# Patient Record
Sex: Female | Born: 1935
Health system: Southern US, Community
[De-identification: ages and names within clinical notes are randomized; demographics above are authoritative.]

## PROBLEM LIST (undated history)

## (undated) DIAGNOSIS — I10 Essential (primary) hypertension: Secondary | ICD-10-CM

## (undated) DIAGNOSIS — J310 Chronic rhinitis: Secondary | ICD-10-CM

## (undated) DIAGNOSIS — J439 Emphysema, unspecified: Secondary | ICD-10-CM

## (undated) DIAGNOSIS — R739 Hyperglycemia, unspecified: Secondary | ICD-10-CM

## (undated) DIAGNOSIS — Z780 Asymptomatic menopausal state: Secondary | ICD-10-CM

## (undated) DIAGNOSIS — M899 Disorder of bone, unspecified: Secondary | ICD-10-CM

## (undated) DIAGNOSIS — N952 Postmenopausal atrophic vaginitis: Secondary | ICD-10-CM

## (undated) DIAGNOSIS — F3289 Other specified depressive episodes: Secondary | ICD-10-CM

## (undated) DIAGNOSIS — E785 Hyperlipidemia, unspecified: Secondary | ICD-10-CM

## (undated) DIAGNOSIS — H269 Unspecified cataract: Secondary | ICD-10-CM

## (undated) DIAGNOSIS — M1990 Unspecified osteoarthritis, unspecified site: Secondary | ICD-10-CM

## (undated) DIAGNOSIS — E119 Type 2 diabetes mellitus without complications: Secondary | ICD-10-CM

## (undated) DIAGNOSIS — M949 Disorder of cartilage, unspecified: Secondary | ICD-10-CM

## (undated) DIAGNOSIS — F329 Major depressive disorder, single episode, unspecified: Secondary | ICD-10-CM

## (undated) DIAGNOSIS — J449 Chronic obstructive pulmonary disease, unspecified: Secondary | ICD-10-CM

## (undated) DIAGNOSIS — F419 Anxiety disorder, unspecified: Secondary | ICD-10-CM

## (undated) HISTORY — DX: Postmenopausal atrophic vaginitis: N95.2

## (undated) HISTORY — DX: Hyperglycemia, unspecified: R73.9

## (undated) HISTORY — DX: Type 2 diabetes mellitus without complications: E11.9

## (undated) HISTORY — DX: Disorder of cartilage, unspecified: M94.9

## (undated) HISTORY — DX: Chronic rhinitis: J31.0

## (undated) HISTORY — DX: Unspecified cataract: H26.9

## (undated) HISTORY — DX: Anxiety disorder, unspecified: F41.9

## (undated) HISTORY — DX: Unspecified osteoarthritis, unspecified site: M19.90

## (undated) HISTORY — DX: Asymptomatic menopausal state: Z78.0

## (undated) HISTORY — DX: Disorder of bone, unspecified: M89.9

## (undated) HISTORY — DX: Hyperlipidemia, unspecified: E78.5

## (undated) HISTORY — DX: Major depressive disorder, single episode, unspecified: F32.9

## (undated) HISTORY — PX: JOINT REPLACEMENT: SHX530

## (undated) HISTORY — DX: Other specified depressive episodes: F32.89

## (undated) HISTORY — PX: EYE SURGERY: SHX253

---
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
## 2000-08-04 ENCOUNTER — Other Ambulatory Visit: Admission: RE | Admit: 2000-08-04 | Discharge: 2000-08-04 | Payer: Self-pay | Admitting: Family Medicine

## 2002-04-11 ENCOUNTER — Other Ambulatory Visit: Admission: RE | Admit: 2002-04-11 | Discharge: 2002-04-11 | Payer: Self-pay | Admitting: Family Medicine

## 2003-04-14 HISTORY — PX: CATARACT EXTRACTION W/ INTRAOCULAR LENS IMPLANT: SHX1309

## 2003-04-25 ENCOUNTER — Other Ambulatory Visit: Admission: RE | Admit: 2003-04-25 | Discharge: 2003-04-25 | Payer: Self-pay | Admitting: Family Medicine

## 2005-12-09 ENCOUNTER — Other Ambulatory Visit: Admission: RE | Admit: 2005-12-09 | Discharge: 2005-12-09 | Payer: Self-pay | Admitting: Family Medicine

## 2006-07-28 ENCOUNTER — Ambulatory Visit: Payer: Self-pay | Admitting: Cardiology

## 2006-08-05 ENCOUNTER — Ambulatory Visit: Payer: Self-pay

## 2007-04-08 ENCOUNTER — Emergency Department (HOSPITAL_COMMUNITY): Admission: EM | Admit: 2007-04-08 | Discharge: 2007-04-08 | Payer: Self-pay | Admitting: Emergency Medicine

## 2007-09-06 ENCOUNTER — Ambulatory Visit (HOSPITAL_COMMUNITY): Admission: RE | Admit: 2007-09-06 | Discharge: 2007-09-06 | Payer: Self-pay | Admitting: Family Medicine

## 2007-12-05 ENCOUNTER — Ambulatory Visit (HOSPITAL_COMMUNITY): Admission: RE | Admit: 2007-12-05 | Discharge: 2007-12-05 | Payer: Self-pay | Admitting: Family Medicine

## 2008-12-10 LAB — HM PAP SMEAR

## 2009-07-03 LAB — FECAL OCCULT BLOOD, GUAIAC: Fecal Occult Blood: NEGATIVE

## 2010-02-10 LAB — HM MAMMOGRAPHY

## 2010-04-12 ENCOUNTER — Inpatient Hospital Stay (HOSPITAL_COMMUNITY): Admission: EM | Admit: 2010-04-12 | Discharge: 2010-04-15 | Payer: Self-pay | Source: Home / Self Care

## 2010-06-23 LAB — CBC
HCT: 36.5 % (ref 36.0–46.0)
HCT: 39.8 % (ref 36.0–46.0)
Hemoglobin: 14.6 g/dL (ref 12.0–15.0)
MCH: 30.7 pg (ref 26.0–34.0)
MCH: 31.2 pg (ref 26.0–34.0)
MCHC: 36.7 g/dL — ABNORMAL HIGH (ref 30.0–36.0)
MCV: 85 fL (ref 78.0–100.0)
MCV: 89 fL (ref 78.0–100.0)
Platelets: 238 K/uL (ref 150–400)
Platelets: 239 10*3/uL (ref 150–400)
Platelets: 253 10*3/uL (ref 150–400)
RBC: 4.1 MIL/uL (ref 3.87–5.11)
RBC: 4.46 MIL/uL (ref 3.87–5.11)
RBC: 4.68 MIL/uL (ref 3.87–5.11)
RDW: 12.4 % (ref 11.5–15.5)
RDW: 13 % (ref 11.5–15.5)
WBC: 7.1 K/uL (ref 4.0–10.5)

## 2010-06-23 LAB — CK TOTAL AND CKMB (NOT AT ARMC)
CK, MB: 10 ng/mL (ref 0.3–4.0)
Relative Index: 1.7 (ref 0.0–2.5)
Total CK: 585 U/L — ABNORMAL HIGH (ref 7–177)

## 2010-06-23 LAB — DIFFERENTIAL
Basophils Absolute: 0 10*3/uL (ref 0.0–0.1)
Basophils Absolute: 0 K/uL (ref 0.0–0.1)
Basophils Absolute: 0 K/uL (ref 0.0–0.1)
Basophils Relative: 0 % (ref 0–1)
Basophils Relative: 0 % (ref 0–1)
Eosinophils Absolute: 0 K/uL (ref 0.0–0.7)
Eosinophils Absolute: 0 K/uL (ref 0.0–0.7)
Eosinophils Absolute: 0.1 10*3/uL (ref 0.0–0.7)
Eosinophils Relative: 0 % (ref 0–5)
Eosinophils Relative: 0 % (ref 0–5)
Eosinophils Relative: 2 % (ref 0–5)
Lymphocytes Relative: 15 % (ref 12–46)
Lymphocytes Relative: 17 % (ref 12–46)
Lymphs Abs: 1.1 K/uL (ref 0.7–4.0)
Lymphs Abs: 1.4 K/uL (ref 0.7–4.0)
Lymphs Abs: 1.8 10*3/uL (ref 0.7–4.0)
Monocytes Absolute: 0.3 K/uL (ref 0.1–1.0)
Monocytes Absolute: 0.6 K/uL (ref 0.1–1.0)
Monocytes Relative: 5 % (ref 3–12)
Monocytes Relative: 8 % (ref 3–12)
Monocytes Relative: 9 % (ref 3–12)
Neutro Abs: 5.6 K/uL (ref 1.7–7.7)
Neutro Abs: 5.9 K/uL (ref 1.7–7.7)
Neutrophils Relative %: 74 % (ref 43–77)
Neutrophils Relative %: 80 % — ABNORMAL HIGH (ref 43–77)

## 2010-06-23 LAB — LIPID PANEL
HDL: 48 mg/dL
Total CHOL/HDL Ratio: 3 ratio
Triglycerides: 24 mg/dL
VLDL: 5 mg/dL (ref 0–40)

## 2010-06-23 LAB — URINALYSIS, ROUTINE W REFLEX MICROSCOPIC
Bilirubin Urine: NEGATIVE
Glucose, UA: NEGATIVE mg/dL
Hgb urine dipstick: NEGATIVE
Ketones, ur: NEGATIVE mg/dL
Nitrite: NEGATIVE
Protein, ur: NEGATIVE mg/dL
Specific Gravity, Urine: <1.005 — ABNORMAL LOW (ref 1.005–1.030)
Urobilinogen, UA: 0.2 mg/dL (ref 0.0–1.0)
pH: 6 (ref 5.0–8.0)

## 2010-06-23 LAB — COMPREHENSIVE METABOLIC PANEL WITH GFR
ALT: 18 U/L (ref 0–35)
ALT: 19 U/L (ref 0–35)
AST: 30 U/L (ref 0–37)
AST: 32 U/L (ref 0–37)
Albumin: 3.5 g/dL (ref 3.5–5.2)
Albumin: 4.3 g/dL (ref 3.5–5.2)
Alkaline Phosphatase: 52 U/L (ref 39–117)
Alkaline Phosphatase: 75 U/L (ref 39–117)
BUN: 7 mg/dL (ref 6–23)
BUN: 9 mg/dL (ref 6–23)
CO2: 23 meq/L (ref 19–32)
CO2: 25 meq/L (ref 19–32)
Calcium: 8.6 mg/dL (ref 8.4–10.5)
Calcium: 9.1 mg/dL (ref 8.4–10.5)
Chloride: 104 meq/L (ref 96–112)
Chloride: 92 meq/L — ABNORMAL LOW (ref 96–112)
Creatinine, Ser: 0.93 mg/dL (ref 0.4–1.2)
Creatinine, Ser: 0.96 mg/dL (ref 0.4–1.2)
GFR calc non Af Amer: 57 mL/min — ABNORMAL LOW
GFR calc non Af Amer: 59 mL/min — ABNORMAL LOW
Glucose, Bld: 100 mg/dL — ABNORMAL HIGH (ref 70–99)
Glucose, Bld: 149 mg/dL — ABNORMAL HIGH (ref 70–99)
Potassium: 4.3 meq/L (ref 3.5–5.1)
Potassium: 4.9 meq/L (ref 3.5–5.1)
Sodium: 122 meq/L — ABNORMAL LOW (ref 135–145)
Sodium: 135 meq/L (ref 135–145)
Total Bilirubin: 0.7 mg/dL (ref 0.3–1.2)
Total Bilirubin: 0.8 mg/dL (ref 0.3–1.2)
Total Protein: 5.8 g/dL — ABNORMAL LOW (ref 6.0–8.3)
Total Protein: 7.4 g/dL (ref 6.0–8.3)

## 2010-06-23 LAB — LIPASE, BLOOD: Lipase: 18 U/L (ref 11–59)

## 2010-06-23 LAB — URINE CULTURE
Colony Count: NO GROWTH
Culture  Setup Time: 201201012123
Culture: NO GROWTH

## 2010-06-23 LAB — LEGIONELLA ANTIGEN, URINE: Legionella Antigen, Urine: NEGATIVE

## 2010-06-23 LAB — CK: Total CK: 378 U/L — ABNORMAL HIGH (ref 7–177)

## 2010-06-23 LAB — CARDIAC PANEL(CRET KIN+CKTOT+MB+TROPI)
CK, MB: 11.7 ng/mL (ref 0.3–4.0)
Relative Index: 2.1 (ref 0.0–2.5)
Relative Index: 2.5 (ref 0.0–2.5)
Relative Index: 3.1 — ABNORMAL HIGH (ref 0.0–2.5)
Total CK: 369 U/L — ABNORMAL HIGH (ref 7–177)
Total CK: 463 U/L — ABNORMAL HIGH (ref 7–177)
Total CK: 489 U/L — ABNORMAL HIGH (ref 7–177)

## 2010-06-23 LAB — BASIC METABOLIC PANEL
BUN: 11 mg/dL (ref 6–23)
CO2: 23 mEq/L (ref 19–32)
CO2: 25 mEq/L (ref 19–32)
Calcium: 8.8 mg/dL (ref 8.4–10.5)
Chloride: 100 mEq/L (ref 96–112)
Creatinine, Ser: 0.92 mg/dL (ref 0.4–1.2)
GFR calc Af Amer: >60 mL/min (ref >60)
GFR calc non Af Amer: 59 mL/min — ABNORMAL LOW (ref >60)
GFR calc non Af Amer: >60 mL/min (ref >60)
Glucose, Bld: 126 mg/dL — ABNORMAL HIGH (ref 70–99)
Potassium: 4.3 mEq/L (ref 3.5–5.1)
Potassium: 4.6 mEq/L (ref 3.5–5.1)
Sodium: 130 mEq/L — ABNORMAL LOW (ref 135–145)
Sodium: 138 mEq/L (ref 135–145)

## 2010-06-23 LAB — BRAIN NATRIURETIC PEPTIDE: Pro B Natriuretic peptide (BNP): 58.8 pg/mL (ref 0.0–100.0)

## 2010-06-23 LAB — TSH: TSH: 0.628 u[IU]/mL (ref 0.350–4.500)

## 2010-07-04 ENCOUNTER — Encounter: Payer: Self-pay | Admitting: Family Medicine

## 2010-07-04 DIAGNOSIS — R739 Hyperglycemia, unspecified: Secondary | ICD-10-CM

## 2010-07-04 DIAGNOSIS — E785 Hyperlipidemia, unspecified: Secondary | ICD-10-CM

## 2010-07-04 DIAGNOSIS — F3289 Other specified depressive episodes: Secondary | ICD-10-CM

## 2010-07-04 DIAGNOSIS — N952 Postmenopausal atrophic vaginitis: Secondary | ICD-10-CM

## 2010-07-04 DIAGNOSIS — F419 Anxiety disorder, unspecified: Secondary | ICD-10-CM | POA: Insufficient documentation

## 2010-07-04 DIAGNOSIS — F418 Other specified anxiety disorders: Secondary | ICD-10-CM | POA: Insufficient documentation

## 2010-07-04 DIAGNOSIS — M899 Disorder of bone, unspecified: Secondary | ICD-10-CM

## 2010-07-04 DIAGNOSIS — F329 Major depressive disorder, single episode, unspecified: Secondary | ICD-10-CM

## 2010-07-04 DIAGNOSIS — J310 Chronic rhinitis: Secondary | ICD-10-CM | POA: Insufficient documentation

## 2010-07-04 DIAGNOSIS — Z780 Asymptomatic menopausal state: Secondary | ICD-10-CM

## 2010-08-29 NOTE — Assessment & Plan Note (Signed)
[facility]                            CARDIOLOGY OFFICE NOTE   NAME:[name], [name] DARROW                      MRN:          [id number]  DATE:07/28/2006                            DOB:          Apr 12, 1932    PRIMARY CARE PHYSICIAN:  [name] [name], M.D.   REASON FOR PRESENTATION:  Evaluate patient with abnormal EKG.   HISTORY OF PRESENT ILLNESS:  The patient is 77 years old.  She has had  an abnormal EKG with poor anterior R wave progression.  She has had this  previously.  There was a question on her echocardiogram of some  questionable hypokinesis of the septum.  She has had some problems  recently.  There is stress at work.  She had one episode where she  became very hypertensive by her report (180/84).  She felt lightheaded.  She may have had some dyspnea.  She does occasionally get some pain  under her left breast.  This seems to be a stable pattern.  It is not  associated with activity.  It is sharp.  There is no associated nausea,  vomiting, or diaphoresis.  It goes away spontaneously.  She has not had  any new shortness of breath other than above.  She has had no PND or  orthopnea.  She is not describing palpitations, presyncope, or syncope.   PAST MEDICAL HISTORY:  1. Hypertension.  2. Anxiety.  3. Hyperlipidemia.   PAST SURGICAL HISTORY:  Cataract surgery.   ALLERGIES:  PENICILLIN, SHELLFISH, BETADINE, LIBRAX, SEPTRA, MYCINS.   MEDICATIONS:  1. Aspirin 81 mg daily.  2. Fish oil.  3. Lexapro 10 mg daily.  4. Calcium.  5. Lovastatin 20 mg daily.  6. Xanax.   SOCIAL HISTORY:  The patient works at [employer].  She is married.  She has  2 children.  She does not smoke cigarettes or drink alcohol.   FAMILY HISTORY:  Noncontributory for early coronary disease, though she  did have a sister who died of an aneurysm.   REVIEW OF SYSTEMS:  As stated in the HPI.  Positive for asthma, lower  extremity swelling.  Negative for other systems.   PHYSICAL EXAMINATION:  GENERAL:  The patient is anxious appearing but in  no distress.  VITAL SIGNS:  Blood pressure 120/68, heart rate 61 and regular, weight  153 pounds, body mass index 28.  HEENT:  Eyelids unremarkable.  Pupils equal, round, and reactive to  light.  Fundi not visualized. Oral mucosa unremarkable.  NECK:  No jugular venous distention.  Waveform within normal limits.  Carotid upstroke brisk and symmetric.  No bruits.  No thyromegaly.  LYMPHATICS:  No cervical, axillary, or inguinal adenopathy.  LUNGS:  Clear to auscultation bilaterally.  BACK:  No costovertebral angle tenderness.  CHEST:  Unremarkable.  HEART:  PMI not displaced or sustained.  S1 and S2 within normal limits.  No S3, no S4, no clicks, rubs, murmurs.  ABDOMEN:  Flat, positive bowel sounds, normal in frequency and pitch.  No bruits, rebound, guarding, or midline pulsatile mass.  No  hepatomegaly or splenomegaly.  SKIN:  No rashes, no nodules.  EXTREMITIES:  2+ pulses throughout.  No edema, cyanosis, or clubbing.  NEUROLOGIC:  Oriented to person, place, and time.  Cranial nerves II-XII  grossly intact.  Motor grossly intact.   EKG:  Sinus rhythm, left axis deviation, poor anterior R wave  progression.  Cannot exclude anteroseptal myocardial infarction.   ASSESSMENT AND PLAN:  1. Abnormal electrocardiogram.  The patient has an abnormal      electrocardiogram.  She had a slightly abnormal echocardiogram 3-      1/2 years ago.  She does have some chest discomfort and risk      factors.  The pretest probability of obstructive coronary disease      is moderate.  She says she would not be able to walk on a treadmill      because of joint limitations.  Therefore, she needs stress test      with an adenosine Cardiolite.  Further evaluation will be based on      these results.  2. Hypertension.  She seems to be doing well with lifestyle      modification and will continue this.  3. Followup will be based on  the above.     Rollene Rotunda, MD, [facility]  Electronically Signed    JH/MedQ  DD: 07/28/2006  DT: 07/28/2006  Job #: [id number]   cc:   [name] [name], M.D.

## 2011-05-22 ENCOUNTER — Other Ambulatory Visit: Payer: Self-pay | Admitting: Orthopedic Surgery

## 2011-05-22 DIAGNOSIS — M25562 Pain in left knee: Secondary | ICD-10-CM

## 2011-05-23 ENCOUNTER — Other Ambulatory Visit: Payer: Self-pay

## 2011-05-27 ENCOUNTER — Ambulatory Visit
Admission: RE | Admit: 2011-05-27 | Discharge: 2011-05-27 | Disposition: A | Discharge status: Home / Self Care | Payer: Medicare Other | Source: Ambulatory Visit | Attending: Orthopedic Surgery | Admitting: Orthopedic Surgery

## 2011-05-27 DIAGNOSIS — M25562 Pain in left knee: Secondary | ICD-10-CM

## 2012-08-09 ENCOUNTER — Other Ambulatory Visit (INDEPENDENT_AMBULATORY_CARE_PROVIDER_SITE_OTHER): Payer: Medicare Other

## 2012-08-09 DIAGNOSIS — Z79899 Other long term (current) drug therapy: Secondary | ICD-10-CM

## 2012-08-09 DIAGNOSIS — E559 Vitamin D deficiency, unspecified: Secondary | ICD-10-CM

## 2012-08-09 DIAGNOSIS — R5383 Other fatigue: Secondary | ICD-10-CM

## 2012-08-09 DIAGNOSIS — R5381 Other malaise: Secondary | ICD-10-CM

## 2012-08-09 DIAGNOSIS — E785 Hyperlipidemia, unspecified: Secondary | ICD-10-CM

## 2012-08-09 LAB — POCT CBC
HCT, POC: 43 % (ref 37.7–47.9)
Lymph, poc: 2.3 (ref 0.6–3.4)
MCHC: 33.7 g/dL (ref 31.8–35.4)
MCV: 92 fL (ref 80–97)
POC Granulocyte: 4 (ref 2–6.9)
Platelet Count, POC: 233 10*3/uL (ref 142–424)
RDW, POC: 13.4 %
WBC: 6.5 10*3/uL (ref 4.6–10.2)

## 2012-08-09 LAB — BASIC METABOLIC PANEL
BUN: 13 mg/dL (ref 6–23)
CO2: 27 mEq/L (ref 19–32)
Chloride: 103 mEq/L (ref 96–112)
Creat: 0.77 mg/dL (ref 0.50–1.10)
Glucose, Bld: 125 mg/dL — ABNORMAL HIGH (ref 70–99)

## 2012-08-09 LAB — HEPATIC FUNCTION PANEL
ALT: 15 U/L (ref 0–35)
Albumin: 4.4 g/dL (ref 3.5–5.2)
Indirect Bilirubin: 0.7 mg/dL (ref 0.0–0.9)
Total Protein: 6.9 g/dL (ref 6.0–8.3)

## 2012-08-10 LAB — NMR LIPOPROFILE WITH LIPIDS
Cholesterol, Total: 127 mg/dL (ref <200)
HDL Particle Number: 28.7 umol/L — ABNORMAL LOW (ref >=30.5)
Large VLDL-P: <0.8 nmol/L (ref <=2.7)
Small LDL Particle Number: 418 nmol/L (ref <=527)

## 2012-08-10 LAB — VITAMIN D 25 HYDROXY (VIT D DEFICIENCY, FRACTURES): Vit D, 25-Hydroxy: 49 ng/mL (ref 30–89)

## 2012-08-16 NOTE — Addendum Note (Signed)
Addended by: Orma Render F on: 08/16/2012 03:36 PM   Modules accepted: Orders

## 2012-08-18 ENCOUNTER — Ambulatory Visit (INDEPENDENT_AMBULATORY_CARE_PROVIDER_SITE_OTHER): Payer: Medicare Other | Admitting: Family Medicine

## 2012-08-18 ENCOUNTER — Encounter: Payer: Self-pay | Admitting: Family Medicine

## 2012-08-18 ENCOUNTER — Ambulatory Visit (INDEPENDENT_AMBULATORY_CARE_PROVIDER_SITE_OTHER): Payer: Medicare Other

## 2012-08-18 VITALS — BP 117/60 | HR 67 | Temp 97.7°F | Ht 61.5 in | Wt 152.0 lb

## 2012-08-18 DIAGNOSIS — M545 Low back pain, unspecified: Secondary | ICD-10-CM

## 2012-08-18 DIAGNOSIS — M79605 Pain in left leg: Secondary | ICD-10-CM

## 2012-08-18 DIAGNOSIS — E785 Hyperlipidemia, unspecified: Secondary | ICD-10-CM

## 2012-08-18 DIAGNOSIS — F419 Anxiety disorder, unspecified: Secondary | ICD-10-CM

## 2012-08-18 DIAGNOSIS — R739 Hyperglycemia, unspecified: Secondary | ICD-10-CM

## 2012-08-18 DIAGNOSIS — J310 Chronic rhinitis: Secondary | ICD-10-CM

## 2012-08-18 DIAGNOSIS — R7309 Other abnormal glucose: Secondary | ICD-10-CM

## 2012-08-18 DIAGNOSIS — F411 Generalized anxiety disorder: Secondary | ICD-10-CM

## 2012-08-18 NOTE — Progress Notes (Signed)
  Subjective:    Patient ID: [name] [name], female    DOB: 1932/01/24, 79 y.o.   MRN: [id number]  HPI Patient saw Dr. Lajoyce Corners about 2 weeks ago for left leg pain. He had seen him previously for left knee pain. She continues to hurt down her left leg especially with walking at work. Occasional stiffness if she stands for long periods   Review of Systems  Constitutional: Negative.   HENT: Positive for congestion.   Eyes: Positive for itching (due to allergies).  Respiratory: Positive for cough.   Cardiovascular: Positive for leg swelling (slight).  Gastrointestinal: Negative.   Genitourinary: Negative.   Musculoskeletal: Positive for back pain (LBP) and arthralgias (L hip radiating down leg).  Allergic/Immunologic: Positive for environmental allergies (seasonal).  Psychiatric/Behavioral: Negative.        Objective:   Physical Exam BP 117/60  Pulse 67  Temp(Src) 97.7 F (36.5 C) (Oral)  Ht 5' 1.5" (1.562 m)  Wt 152 lb (68.947 kg)  BMI 28.26 kg/m2  The patient appeared well nourished and normally developed, alert and oriented to time and place. Speech, behavior and judgement appear normal. Vital signs as documented.  Head exam is unremarkable. No scleral icterus or pallor noted. Nasal congestion bilaterally. TMs were normal. Throat and mouth were normal. Neck is without jugular venous distension, thyromegally, or carotid bruits. Carotid upstrokes are brisk bilaterally. No cervical adenopathy. Lungs are clear anteriorly and posteriorly to auscultation. Normal respiratory effort. Cardiac exam reveals regular rate and rhythm at 72 per minute. First and second heart sounds normal.  No murmurs, rubs or gallops.  Abdominal exam reveals normal bowl sounds, no masses, no organomegaly and no aortic enlargement. No inguinal adenopathy. Some protuberant obesity. Extremities are nonedematous and both femoral and pedal pulses are normal. With leg raising on the left there was left back and hip  pain. Skin without pallor or jaundice.  Warm and dry, without rash. Neurologic exam reveals normal deep tendon reflexes and normal sensation. Reflexes were 3+ and equal bilaterally   [facility] reading (PRIMARY) by  Dr. Christell Constant: LS-spine degenerative joint disease                                        Assessment & Plan:  1. LBP radiating to left leg - DG Lumbar Spine 2-3 Views  2. Anxiety  3. Elevated blood sugar Continue to watch diet closely  4. hyperlipidemia Continue to watch diet  5. Rhinitis Use nose spray as directed  Patient Instructions  Fall precautions discussed Continue current meds and therapeutic lifestyle changes Continue meds for allergic rhinitis and bronchospasm as needed

## 2012-08-18 NOTE — Patient Instructions (Addendum)
Fall precautions discussed Continue current meds and therapeutic lifestyle changes Continue meds for allergic rhinitis and bronchospasm as needed

## 2012-08-23 ENCOUNTER — Telehealth: Payer: Self-pay | Admitting: *Deleted

## 2012-08-23 DIAGNOSIS — M545 Low back pain, unspecified: Secondary | ICD-10-CM

## 2012-08-23 NOTE — Telephone Encounter (Signed)
Message copied by Bearl Mulberry on Tue Aug 23, 2012  8:11 AM ------      Message from: Ernestina Penna      Created: Fri Aug 19, 2012  5:43 PM       As per report, degenerative changes, worse at L5 and S1, if problems continue will need to have MRI, let us know ------

## 2012-08-27 ENCOUNTER — Ambulatory Visit
Admission: RE | Admit: 2012-08-27 | Discharge: 2012-08-27 | Disposition: A | Discharge status: Home / Self Care | Payer: Medicare Other | Source: Ambulatory Visit | Attending: Family Medicine | Admitting: Family Medicine

## 2012-08-27 DIAGNOSIS — M545 Low back pain, unspecified: Secondary | ICD-10-CM

## 2012-08-30 ENCOUNTER — Other Ambulatory Visit: Payer: Self-pay | Admitting: Family Medicine

## 2012-08-30 MED ORDER — MELOXICAM 7.5 MG PO TABS: 7.5000 mg | ORAL_TABLET | Freq: Every day | ORAL | Status: DC

## 2012-10-10 ENCOUNTER — Other Ambulatory Visit: Payer: Self-pay | Admitting: Family Medicine

## 2012-11-17 ENCOUNTER — Encounter: Payer: Self-pay | Admitting: Family Medicine

## 2012-11-17 ENCOUNTER — Ambulatory Visit (INDEPENDENT_AMBULATORY_CARE_PROVIDER_SITE_OTHER): Payer: Medicare Other | Admitting: Family Medicine

## 2012-11-17 VITALS — BP 111/62 | HR 64 | Temp 98.4°F | Ht 61.0 in | Wt 149.0 lb

## 2012-11-17 DIAGNOSIS — R079 Chest pain, unspecified: Secondary | ICD-10-CM

## 2012-11-17 DIAGNOSIS — B0223 Postherpetic polyneuropathy: Secondary | ICD-10-CM

## 2012-11-17 MED ORDER — VALACYCLOVIR HCL 1 G PO TABS: 1000.0000 mg | ORAL_TABLET | Freq: Three times a day (TID) | ORAL | Status: DC

## 2012-11-17 NOTE — Patient Instructions (Addendum)
Take Tylenol Extra Strength one or 2------3 times a day if needed for pain Take prescription medicine as directed Return to clinic in 7-10 days for recheck   Shingles Shingles (herpes zoster) is an infection that is caused by the same virus that causes chickenpox (varicella). The infection causes a painful skin rash and fluid-filled blisters, which eventually break open, crust over, and heal. It may occur in any area of the body, but it usually affects only one side of the body or face. The pain of shingles usually lasts about 1 month. However, some people with shingles may develop long-term (chronic) pain in the affected area of the body. Shingles often occurs many years after the person had chickenpox. It is more common:  In people older than 50 years.  In people with weakened immune systems, such as those with HIV, AIDS, or cancer.  In people taking medicines that weaken the immune system, such as transplant medicines.  In people under great stress. CAUSES  Shingles is caused by the varicella zoster virus (VZV), which also causes chickenpox. After a person is infected with the virus, it can remain in the person's body for years in an inactive state (dormant). To cause shingles, the virus reactivates and breaks out as an infection in a nerve root. The virus can be spread from person to person (contagious) through contact with open blisters of the shingles rash. It will only spread to people who have not had chickenpox. When these people are exposed to the virus, they may develop chickenpox. They will not develop shingles. Once the blisters scab over, the person is no longer contagious and cannot spread the virus to others. SYMPTOMS  Shingles shows up in stages. The initial symptoms may be pain, itching, and tingling in an area of the skin. This pain is usually described as burning, stabbing, or throbbing.In a few days or weeks, a painful red rash will appear in the area where the pain,  itching, and tingling were felt. The rash is usually on one side of the body in a band or belt-like pattern. Then, the rash usually turns into fluid-filled blisters. They will scab over and dry up in approximately 2 3 weeks. Flu-like symptoms may also occur with the initial symptoms, the rash, or the blisters. These may include:  Fever.  Chills.  Headache.  Upset stomach. DIAGNOSIS  Your caregiver will perform a skin exam to diagnose shingles. Skin scrapings or fluid samples may also be taken from the blisters. This sample will be examined under a microscope or sent to a lab for further testing. TREATMENT  There is no specific cure for shingles. Your caregiver will likely prescribe medicines to help you manage the pain, recover faster, and avoid long-term problems. This may include antiviral drugs, anti-inflammatory drugs, and pain medicines. HOME CARE INSTRUCTIONS   Take a cool bath or apply cool compresses to the area of the rash or blisters as directed. This may help with the pain and itching.   Only take over-the-counter or prescription medicines as directed by your caregiver.   Rest as directed by your caregiver.  Keep your rash and blisters clean with mild soap and cool water or as directed by your caregiver.  Do not pick your blisters or scratch your rash. Apply an anti-itch cream or numbing creams to the affected area as directed by your caregiver.  Keep your shingles rash covered with a loose bandage (dressing).  Avoid skin contact with:  Babies.   Pregnant women.  Children with eczema.   Elderly people with transplants.   People with chronic illnesses, such as leukemia or AIDS.   Wear loose-fitting clothing to help ease the pain of material rubbing against the rash.  Keep all follow-up appointments with your caregiver.If the area involved is on your face, you may receive a referral for follow-up to a specialist, such as an eye doctor (ophthalmologist) or  an ear, nose, and throat (ENT) doctor. Keeping all follow-up appointments will help you avoid eye complications, chronic pain, or disability.  SEEK IMMEDIATE MEDICAL CARE IF:   You have facial pain, pain around the eye area, or loss of feeling on one side of your face.  You have ear pain or ringing in your ear.  You have loss of taste.  Your pain is not relieved with prescribed medicines.   Your redness or swelling spreads.   You have more pain and swelling.  Your condition is worsening or has changed.   You have a feveror persistent symptoms for more than 2 3 days.  You have a fever and your symptoms suddenly get worse. MAKE SURE YOU:  Understand these instructions.  Will watch your condition.  Will get help right away if you are not doing well or get worse. Document Released: 03/30/2005 Document Revised: 12/23/2011 Document Reviewed: 11/12/2011 [facility] Patient Information 2014 [place].

## 2012-11-17 NOTE — Progress Notes (Signed)
Subjective:    Patient ID: [name] [name], female    DOB: Aug 25, 1931, 79 y.o.   MRN: [id number]  HPI Patient comes in today to evaluate pain that was experienced 2 nights ago and a right epigastric area to the right lateral chest area. There was no history of shortness of breath. No nausea. No history of any injury. No falls. No heartburn and no vomiting. Appetite was good. Pain has been persistent since this started 2 nights ago. Laying down may help some.   Review of Systems  Constitutional: Negative for fever and fatigue.  HENT: Positive for congestion (slight due to allergies). Negative for ear pain, sore throat, sneezing, postnasal drip and sinus pressure.   Eyes: Positive for pain. Negative for discharge and redness.  Respiratory: Positive for cough (dry) and shortness of breath.   Cardiovascular: Positive for chest pain (mid epigastric, tylenol relieved the pain). Negative for palpitations and leg swelling.  Gastrointestinal: Negative for nausea, vomiting, abdominal pain and constipation.  Endocrine: Negative.   Genitourinary: Negative for dysuria, urgency, frequency, hematuria, vaginal bleeding, vaginal discharge and vaginal pain.  Allergic/Immunologic: Positive for environmental allergies.  Neurological: Negative for dizziness, tremors, syncope, weakness, numbness and headaches.  Psychiatric/Behavioral: The patient is nervous/anxious (stable).        Objective:   Physical Exam  Constitutional: She appears well-developed and well-nourished. No distress.  HENT:  Head: Normocephalic and atraumatic.  Right Ear: External ear normal.  Left Ear: External ear normal.  Nose: Nose normal.  Mouth/Throat: Oropharynx is clear and moist. No oropharyngeal exudate.  Eyes: Conjunctivae are normal. Right eye exhibits no discharge. Left eye exhibits no discharge.  Neck: Normal range of motion. Neck supple. No thyromegaly present.  Cardiovascular: Normal rate, regular rhythm and normal heart  sounds.  Exam reveals no gallop.   No murmur heard. Pulmonary/Chest: Effort normal and breath sounds normal. Wheezes: few,but sparse scattered wheeze. She has no rales.  Musculoskeletal: Normal range of motion. She exhibits no edema.  Neurological: She is alert.  Skin: Skin is warm and dry. Rash noted. She is not diaphoretic.  Clusters of blisters on right para thoracic spine  Psychiatric: She has a normal mood and affect. Her behavior is normal. Judgment and thought content normal.         EKG: unchanged from previous tracings, Q waves in V1 through 3 but no change since previous EKG on 06-11-2010   Assessment & Plan:  1. Chest pain - EKG 12-Lead  2. Shingles (herpes zoster) polyneuropathy - valACYclovir (VALTREX) 1000 MG tablet; Take 1 tablet (1,000 mg total) by mouth 3 (three) times daily.  Dispense: 30 tablet; Refill: 0  If something stronger for pain as needed please call back before your return to the office  Patient Instructions  Take Tylenol Extra Strength one or 2------3 times a day if needed for pain Take prescription medicine as directed Return to clinic in 7-10 days for recheck   Shingles Shingles (herpes zoster) is an infection that is caused by the same virus that causes chickenpox (varicella). The infection causes a painful skin rash and fluid-filled blisters, which eventually break open, crust over, and heal. It may occur in any area of the body, but it usually affects only one side of the body or face. The pain of shingles usually lasts about 1 month. However, some people with shingles may develop long-term (chronic) pain in the affected area of the body. Shingles often occurs many years after the person had chickenpox. It is more  common:  In people older than 50 years.  In people with weakened immune systems, such as those with HIV, AIDS, or cancer.  In people taking medicines that weaken the immune system, such as transplant medicines.  In people under great  stress. CAUSES  Shingles is caused by the varicella zoster virus (VZV), which also causes chickenpox. After a person is infected with the virus, it can remain in the person's body for years in an inactive state (dormant). To cause shingles, the virus reactivates and breaks out as an infection in a nerve root. The virus can be spread from person to person (contagious) through contact with open blisters of the shingles rash. It will only spread to people who have not had chickenpox. When these people are exposed to the virus, they may develop chickenpox. They will not develop shingles. Once the blisters scab over, the person is no longer contagious and cannot spread the virus to others. SYMPTOMS  Shingles shows up in stages. The initial symptoms may be pain, itching, and tingling in an area of the skin. This pain is usually described as burning, stabbing, or throbbing.In a few days or weeks, a painful red rash will appear in the area where the pain, itching, and tingling were felt. The rash is usually on one side of the body in a band or belt-like pattern. Then, the rash usually turns into fluid-filled blisters. They will scab over and dry up in approximately 2 3 weeks. Flu-like symptoms may also occur with the initial symptoms, the rash, or the blisters. These may include:  Fever.  Chills.  Headache.  Upset stomach. DIAGNOSIS  Your caregiver will perform a skin exam to diagnose shingles. Skin scrapings or fluid samples may also be taken from the blisters. This sample will be examined under a microscope or sent to a lab for further testing. TREATMENT  There is no specific cure for shingles. Your caregiver will likely prescribe medicines to help you manage the pain, recover faster, and avoid long-term problems. This may include antiviral drugs, anti-inflammatory drugs, and pain medicines. HOME CARE INSTRUCTIONS   Take a cool bath or apply cool compresses to the area of the rash or blisters as  directed. This may help with the pain and itching.   Only take over-the-counter or prescription medicines as directed by your caregiver.   Rest as directed by your caregiver.  Keep your rash and blisters clean with mild soap and cool water or as directed by your caregiver.  Do not pick your blisters or scratch your rash. Apply an anti-itch cream or numbing creams to the affected area as directed by your caregiver.  Keep your shingles rash covered with a loose bandage (dressing).  Avoid skin contact with:  Babies.   Pregnant women.   Children with eczema.   Elderly people with transplants.   People with chronic illnesses, such as leukemia or AIDS.   Wear loose-fitting clothing to help ease the pain of material rubbing against the rash.  Keep all follow-up appointments with your caregiver.If the area involved is on your face, you may receive a referral for follow-up to a specialist, such as an eye doctor (ophthalmologist) or an ear, nose, and throat (ENT) doctor. Keeping all follow-up appointments will help you avoid eye complications, chronic pain, or disability.  SEEK IMMEDIATE MEDICAL CARE IF:   You have facial pain, pain around the eye area, or loss of feeling on one side of your face.  You have ear pain or ringing  in your ear.  You have loss of taste.  Your pain is not relieved with prescribed medicines.   Your redness or swelling spreads.   You have more pain and swelling.  Your condition is worsening or has changed.   You have a feveror persistent symptoms for more than 2 3 days.  You have a fever and your symptoms suddenly get worse. MAKE SURE YOU:  Understand these instructions.  Will watch your condition.  Will get help right away if you are not doing well or get worse. Document Released: 03/30/2005 Document Revised: 12/23/2011 Document Reviewed: 11/12/2011 [facility] Patient Information 2014 [place], [place].     Nyra Capes MD

## 2012-11-28 ENCOUNTER — Ambulatory Visit (INDEPENDENT_AMBULATORY_CARE_PROVIDER_SITE_OTHER): Payer: Medicare Other | Admitting: Family Medicine

## 2012-11-28 ENCOUNTER — Encounter: Payer: Self-pay | Admitting: Family Medicine

## 2012-11-28 VITALS — BP 114/53 | HR 74 | Temp 97.7°F | Ht 61.0 in | Wt 147.0 lb

## 2012-11-28 DIAGNOSIS — B0223 Postherpetic polyneuropathy: Secondary | ICD-10-CM

## 2012-11-28 NOTE — Patient Instructions (Addendum)
Continue taking Tylenol as needed for pain Try the Lidoderm patch as directed for 12 hours daily Reevaluate pain at September visit

## 2012-11-28 NOTE — Progress Notes (Signed)
  Subjective:    Patient ID: [name] [name], female    DOB: 07-Sep-1931, 79 y.o.   MRN: [id number]  HPI Patient returns to clinic today for followup of shingles rash that occurred on the right side beneath the right breast. She has completed all of her medication. She is only taking extra strength Tylenol for pain, and does not want anything any stronger. She says the pain is especially bad beneath the right breast.   Review of Systems  Constitutional: Positive for fatigue. Negative for fever and chills.  HENT: Negative.  Negative for ear pain, congestion, sore throat, rhinorrhea, sneezing, postnasal drip, sinus pressure and tinnitus.   Eyes: Negative.  Negative for photophobia, pain, discharge, redness, itching and visual disturbance.  Respiratory: Positive for cough.   Gastrointestinal: Negative.   Genitourinary: Negative for dysuria, urgency, frequency, hematuria, vaginal bleeding, vaginal discharge and vaginal pain.  Musculoskeletal: Negative.  Negative for myalgias, back pain, joint swelling and arthralgias.  Skin: Positive for rash (painful underneath R breast around to back).  Neurological: Negative for dizziness, syncope, weakness, numbness and headaches.       Objective:   Physical Exam The rash on the back and right lateral chest and right lateral breast is drying and clearing.       Assessment & Plan:  Shingles (herpes zoster) polyneuropathy  Patient Instructions  Continue taking Tylenol as needed for pain Try the Lidoderm patch as directed for 12 hours daily Reevaluate pain at September visit    Nyra Capes MD

## 2012-12-23 ENCOUNTER — Other Ambulatory Visit (INDEPENDENT_AMBULATORY_CARE_PROVIDER_SITE_OTHER): Payer: Medicare Other

## 2012-12-23 DIAGNOSIS — I10 Essential (primary) hypertension: Secondary | ICD-10-CM

## 2012-12-23 DIAGNOSIS — E559 Vitamin D deficiency, unspecified: Secondary | ICD-10-CM

## 2012-12-23 DIAGNOSIS — E785 Hyperlipidemia, unspecified: Secondary | ICD-10-CM

## 2012-12-23 LAB — POCT CBC
Granulocyte percent: 60.3 %G (ref 37–80)
HCT, POC: 41.8 % (ref 37.7–47.9)
MCV: 91.8 fL (ref 80–97)
POC LYMPH PERCENT: 36.4 %L (ref 10–50)
RDW, POC: 14.6 %

## 2012-12-23 NOTE — Progress Notes (Signed)
Pt came in for labs only 

## 2012-12-25 LAB — BMP8+EGFR
BUN/Creatinine Ratio: 17 (ref 11–26)
GFR calc Af Amer: 88 mL/min/{1.73_m2} (ref >59)
GFR calc non Af Amer: 77 mL/min/{1.73_m2} (ref >59)
Potassium: 5.5 mmol/L — ABNORMAL HIGH (ref 3.5–5.2)
Sodium: 141 mmol/L (ref 134–144)

## 2012-12-25 LAB — NMR, LIPOPROFILE
Cholesterol: 144 mg/dL (ref <200)
HDL Cholesterol by NMR: 50 mg/dL (ref >=40)
HDL Particle Number: 32.4 umol/L (ref >=30.5)
LDLC SERPL CALC-MCNC: 79 mg/dL (ref <100)
LP-IR Score: 30 (ref <=45)

## 2012-12-25 LAB — HEPATIC FUNCTION PANEL
ALT: 13 IU/L (ref 0–32)
AST: 18 IU/L (ref 0–40)
Alkaline Phosphatase: 78 IU/L (ref 39–117)
Bilirubin, Direct: 0.17 mg/dL (ref 0.00–0.40)

## 2012-12-25 LAB — VITAMIN D 25 HYDROXY (VIT D DEFICIENCY, FRACTURES): Vit D, 25-Hydroxy: 50.9 ng/mL (ref 30.0–100.0)

## 2012-12-26 ENCOUNTER — Ambulatory Visit (INDEPENDENT_AMBULATORY_CARE_PROVIDER_SITE_OTHER): Payer: Medicare Other | Admitting: Family Medicine

## 2012-12-26 ENCOUNTER — Encounter: Payer: Self-pay | Admitting: Family Medicine

## 2012-12-26 VITALS — BP 89/48 | HR 67 | Temp 97.3°F | Ht 61.5 in | Wt 147.0 lb

## 2012-12-26 DIAGNOSIS — J9801 Acute bronchospasm: Secondary | ICD-10-CM

## 2012-12-26 DIAGNOSIS — R739 Hyperglycemia, unspecified: Secondary | ICD-10-CM

## 2012-12-26 DIAGNOSIS — E559 Vitamin D deficiency, unspecified: Secondary | ICD-10-CM

## 2012-12-26 DIAGNOSIS — R5383 Other fatigue: Secondary | ICD-10-CM

## 2012-12-26 DIAGNOSIS — I10 Essential (primary) hypertension: Secondary | ICD-10-CM

## 2012-12-26 DIAGNOSIS — E785 Hyperlipidemia, unspecified: Secondary | ICD-10-CM

## 2012-12-26 DIAGNOSIS — E875 Hyperkalemia: Secondary | ICD-10-CM

## 2012-12-26 DIAGNOSIS — R5381 Other malaise: Secondary | ICD-10-CM

## 2012-12-26 DIAGNOSIS — R7309 Other abnormal glucose: Secondary | ICD-10-CM

## 2012-12-26 LAB — POCT GLYCOSYLATED HEMOGLOBIN (HGB A1C): Hemoglobin A1C: 5.8

## 2012-12-26 NOTE — Progress Notes (Signed)
Subjective:    Patient ID: [name] [name], female    DOB: 02-Apr-1932, 79 y.o.   MRN: [id number]  HPI Patient comes in today for followup and management of chronic medical problems. These include hypertension, hyperlipidemia, depression, vitamin D deficiency, and elevated blood sugar. On recent lab work she also had an elevated potassium and this will be repeated today. Also because her sugar was elevated we will do a hemoglobin A1c since she is here today. She has been told that she may still better job of watching her cholesterol. See also review of systems. Patient is also having some left knee and leg pain. She continues to have pain in her right chest from the shingles.    Review of Systems  Constitutional: Positive for fatigue.  HENT: Negative.  Negative for ear pain, congestion, sore throat, rhinorrhea and sneezing.   Eyes: Positive for redness and itching (slight due to allergies). Negative for pain.  Respiratory: Positive for cough (dry).   Cardiovascular: Positive for leg swelling (slight L lower). Negative for chest pain and palpitations.  Gastrointestinal: Negative for nausea, vomiting, abdominal pain, diarrhea, constipation and blood in stool.  Endocrine: Negative.  Negative for cold intolerance, heat intolerance, polydipsia, polyphagia and polyuria.  Genitourinary: Negative for dysuria, urgency, frequency, vaginal bleeding and vaginal discharge.  Musculoskeletal: Positive for arthralgias (L leg). Negative for myalgias and back pain.  Skin: Positive for wound (due to insect bites). Negative for color change.  Allergic/Immunologic: Positive for environmental allergies.  Neurological: Positive for tremors. Negative for dizziness, weakness, light-headedness and headaches.  Psychiatric/Behavioral: Negative for confusion, sleep disturbance and agitation. The patient is nervous/anxious (stable).        Objective:   Physical Exam BP 89/48  Pulse 67  Temp(Src) 97.3 F (36.3 C)  (Oral)  Ht 5' 1.5" (1.562 m)  Wt 147 lb (66.679 kg)  BMI 27.33 kg/m2  The patient appeared well nourished and normally developed, alert and oriented to time and place. Speech, behavior and judgement appear normal. Vital signs as documented.  Head exam is unremarkable. No scleral icterus or pallor noted. Nasal passages ears and throat were all normal. Neck is without jugular venous distension, thyromegally, or carotid bruits. Carotid upstrokes are brisk bilaterally. No cervical adenopathy. Lungs are clear anteriorly and posteriorly to auscultation. Initially lungs had wheezes on inspiration , but these seem to clear with continued inhalation and exhalation .Respiratory effort was normal. Cardiac exam reveals regular rate and rhythm at 84 per minute. First and second heart sounds normal.  No murmurs, rubs or gallops.  Abdominal exam reveals normal bowl sounds, no masses, no organomegaly and no aortic enlargement. No inguinal adenopathy. Extremities are nonedematous and both femoral and pedal pulses are normal. Musculoskeletal, patient was stiff and slow with movement. Skin without pallor or jaundice.  Warm and dry, without rash. Neurologic exam reveals normal deep tendon reflexes and normal sensation.          Assessment & Plan:  1. Hypertension  2. Hyperlipemia -Do better with diet   3. Hyperkalemia - BASIC METABOLIC PANEL WITH GFR; Standing - BASIC METABOLIC PANEL WITH GFR  4. Fatigue  5. Vitamin D deficiency   6. Elevated blood sugar - POCT glycosylated hemoglobin (Hb A1C)  7. Bronchospasm   Patient Instructions  Fall precautions discussed Continue current meds and therapeutic lifestyle changes Return to clinic in October for a flu shot Sample of Symbicort 160/4.5, 1-2 puffs twice daily as directed,rinse mouth after using Drink plenty of fluids  Arrie Senate MD

## 2012-12-26 NOTE — Patient Instructions (Addendum)
Fall precautions discussed Continue current meds and therapeutic lifestyle changes Return to clinic in October for a flu shot Sample of Symbicort 160/4.5, 1-2 puffs twice daily as directed,rinse mouth after using Drink plenty of fluids

## 2012-12-27 LAB — BASIC METABOLIC PANEL WITH GFR
Calcium: 9.7 mg/dL (ref 8.4–10.5)
Creat: 0.88 mg/dL (ref 0.50–1.10)
GFR, Est Non African American: 63 mL/min

## 2013-01-10 ENCOUNTER — Other Ambulatory Visit: Payer: Self-pay | Admitting: Family Medicine

## 2013-01-30 ENCOUNTER — Other Ambulatory Visit: Payer: Self-pay

## 2013-01-30 NOTE — Telephone Encounter (Signed)
Last seen 12/26/12  DWM  If approved route to nurse to call into [facility]

## 2013-01-31 ENCOUNTER — Telehealth: Payer: Self-pay | Admitting: Family Medicine

## 2013-01-31 MED ORDER — ALPRAZOLAM 0.25 MG PO TABS: 0.2500 mg | ORAL_TABLET | Freq: Every evening | ORAL | Status: DC | PRN

## 2013-01-31 NOTE — Telephone Encounter (Signed)
This is okay to refill for six-month 

## 2013-01-31 NOTE — Telephone Encounter (Signed)
Called in Xanax to [facility]

## 2013-01-31 NOTE — Telephone Encounter (Signed)
Last seen 12/26/12  DWM  If approved route to nurse to call into [facility] 

## 2013-02-01 ENCOUNTER — Ambulatory Visit (INDEPENDENT_AMBULATORY_CARE_PROVIDER_SITE_OTHER): Payer: Medicare Other

## 2013-02-01 DIAGNOSIS — Z23 Encounter for immunization: Secondary | ICD-10-CM

## 2013-02-20 ENCOUNTER — Encounter: Payer: Self-pay | Admitting: Family Medicine

## 2013-02-20 ENCOUNTER — Ambulatory Visit (INDEPENDENT_AMBULATORY_CARE_PROVIDER_SITE_OTHER): Payer: Medicare Other | Admitting: Family Medicine

## 2013-02-20 VITALS — BP 104/60 | HR 79 | Temp 97.0°F | Ht 61.5 in | Wt 146.0 lb

## 2013-02-20 DIAGNOSIS — J3489 Other specified disorders of nose and nasal sinuses: Secondary | ICD-10-CM

## 2013-02-20 DIAGNOSIS — R0981 Nasal congestion: Secondary | ICD-10-CM

## 2013-02-20 DIAGNOSIS — J069 Acute upper respiratory infection, unspecified: Secondary | ICD-10-CM

## 2013-02-20 DIAGNOSIS — J209 Acute bronchitis, unspecified: Secondary | ICD-10-CM

## 2013-02-20 MED ORDER — METHYLPREDNISOLONE ACETATE 40 MG/ML IJ SUSP
60.0000 mg | Freq: Once | INTRAMUSCULAR | Status: AC
  Administered 2013-02-20: 60 mg via INTRAMUSCULAR

## 2013-02-20 MED ORDER — PREDNISONE 10 MG PO TABS: ORAL_TABLET | ORAL | Status: DC

## 2013-02-20 MED ORDER — AZITHROMYCIN 250 MG PO TABS: ORAL_TABLET | ORAL | Status: DC

## 2013-02-20 NOTE — Progress Notes (Signed)
Subjective:    Patient ID: [name] [name], female    DOB: [birth year], 79 y.o.   MRN: [id number]  HPI Patient here today for head cold symptoms. She also has cough and chest congestion. Sputum is yellow. She denies fever and headache. This has been going on for about a week. She is using Flonase at home.    Patient Active Problem List   Diagnosis Date Noted  . hyperlipidemia   . Anxiety   . Disorder of bone and cartilage, unspecified   . Depressive disorder, not elsewhere classified   . Rhinitis   . Elevated blood sugar   . Atrophic vaginitis   . Postmenopausal    Outpatient Encounter Prescriptions as of 02/20/2013  Medication Sig  . ALPRAZolam (XANAX) 0.25 MG tablet Take 1 tablet (0.25 mg total) by mouth at bedtime as needed.  Marland Kitchen amLODipine (NORVASC) 5 MG tablet   . aspirin 81 MG EC tablet Take 81 mg by mouth daily.    . Calcium Carbonate-Vitamin D (OSCAL 500/200 D-3 PO) Take 1 capsule by mouth 2 (two) times daily.    . Cholecalciferol (VITAMIN D) 2000 UNITS CAPS Take 1 capsule by mouth daily.    . citalopram (CELEXA) 20 MG tablet TAKE ONE TABLET BY MOUTH ONE TIME DAILY  . fluticasone (FLONASE) 50 MCG/ACT nasal spray 2 sprays by Nasal route daily.    Marland Kitchen lovastatin (MEVACOR) 40 MG tablet Take 40 mg by mouth at bedtime. 1/2 tab     Review of Systems  Constitutional: Negative.  Negative for fever.  HENT: Positive for congestion, postnasal drip and sinus pressure. Negative for ear pain and sore throat.   Eyes: Negative.   Respiratory: Positive for cough. Negative for shortness of breath.   Cardiovascular: Negative.   Gastrointestinal: Negative.   Endocrine: Negative.   Genitourinary: Negative.   Musculoskeletal: Negative.   Skin: Negative.   Allergic/Immunologic: Negative.   Neurological: Negative.   Hematological: Negative.   Psychiatric/Behavioral: Negative.        Objective:   Physical Exam  Nursing note and vitals reviewed. Constitutional: She is oriented to  person, place, and time. She appears well-developed and well-nourished. No distress.  HENT:  Head: Normocephalic and atraumatic.  Right Ear: External ear normal.  Left Ear: External ear normal.  Mouth/Throat: No oropharyngeal exudate.  Throat is slightly red posteriorly Nasal pallor is present There is slight ethmoid frontal and maxillary sinus tenderness also.  Eyes: Conjunctivae and EOM are normal. Right eye exhibits no discharge. Left eye exhibits no discharge. No scleral icterus.  Neck: Normal range of motion. Neck supple. No thyromegaly present.  Slight anterior cervical tenderness bilaterally.  Cardiovascular: Normal rate, regular rhythm and normal heart sounds.  Exam reveals no gallop.   No murmur heard. Pulmonary/Chest: Effort normal. No respiratory distress. She has wheezes (Rare wheezes). She has no rales.   Does have a dry cough  Musculoskeletal: Normal range of motion.  Lymphadenopathy:    She has no cervical adenopathy.  Neurological: She is alert and oriented to person, place, and time.  Skin: Skin is warm and dry. No rash noted. No erythema.  Psychiatric: She has a normal mood and affect. Her behavior is normal. Judgment and thought content normal.   BP 104/60  Pulse 79  Temp(Src) 97 F (36.1 C) (Oral)  Ht 5' 1.5" (1.562 m)  Wt 146 lb (66.225 kg)  BMI 27.14 kg/m2        Assessment & Plan:   1. Acute bronchitis  2. Head congestion   3. URI (upper respiratory infection)    No orders of the defined types were placed in this encounter.   Meds ordered this encounter  Medications  . azithromycin (ZITHROMAX) 250 MG tablet    Sig: As directed    Dispense:  6 tablet    Refill:  0  . predniSONE (DELTASONE) 10 MG tablet    Sig: Take 1 tab four times a day for 2 days, then 1 tab three times a day for 2 days, then 1 tab twice a day for 2 days, then 1 tab daily for 2 days.    Dispense:  20 tablet    Refill:  0  . methylPREDNISolone acetate (DEPO-MEDROL)  injection 60 mg    Sig:    Patient Instructions  Drink plenty of fluids Take medicine as directed Continue to use nose spray Take Mucinex maximum strength, blue and white in color, over-the-counter one twice daily with a large glass of water   Nyra Capes MD

## 2013-02-20 NOTE — Patient Instructions (Signed)
Drink plenty of fluids Take medicine as directed Continue to use nose spray Take Mucinex maximum strength, blue and white in color, over-the-counter one twice daily with a large glass of water

## 2013-03-04 ENCOUNTER — Other Ambulatory Visit: Payer: Self-pay | Admitting: Family Medicine

## 2013-03-14 ENCOUNTER — Ambulatory Visit (INDEPENDENT_AMBULATORY_CARE_PROVIDER_SITE_OTHER): Payer: Medicare Other | Admitting: General Practice

## 2013-03-14 VITALS — BP 135/70 | HR 66 | Temp 97.8°F | Ht 61.5 in | Wt 142.0 lb

## 2013-03-14 DIAGNOSIS — J329 Chronic sinusitis, unspecified: Secondary | ICD-10-CM

## 2013-03-14 MED ORDER — AZITHROMYCIN 250 MG PO TABS
ORAL_TABLET | ORAL | Status: DC
Start: 2013-03-14 — End: 2013-03-31

## 2013-03-14 NOTE — Patient Instructions (Signed)

## 2013-03-14 NOTE — Progress Notes (Signed)
   Subjective:    Patient ID: [name] [name], female    DOB: April 04, 1932, 79 y.o.   MRN: [id number]  HPI Patient presents today with complaints of head congestion and sinus pressure. Reports having a cough and congestion three weeks ago and received medication. Patient feels better, but head congestion still present. She denies OTC medications.     Review of Systems  Constitutional: Negative for fever and chills.  HENT: Positive for congestion and sinus pressure. Negative for ear pain, postnasal drip and sore throat.        Nasal congestion  Respiratory: Negative for chest tightness and shortness of breath.   Cardiovascular: Negative for chest pain and palpitations.  Genitourinary: Negative for difficulty urinating.  Neurological: Negative for dizziness, weakness and headaches.       Objective:   Physical Exam  Constitutional: She is oriented to person, place, and time. She appears well-developed and well-nourished.  HENT:  Head: Normocephalic and atraumatic.  Right Ear: External ear normal.  Left Ear: External ear normal.  Nose: Right sinus exhibits maxillary sinus tenderness and frontal sinus tenderness. Left sinus exhibits maxillary sinus tenderness and frontal sinus tenderness.  Mouth/Throat: Oropharynx is clear and moist.  Cardiovascular: Normal rate, regular rhythm and normal heart sounds.   Pulmonary/Chest: Effort normal and breath sounds normal. No respiratory distress. She exhibits no tenderness.  Neurological: She is alert and oriented to person, place, and time.  Skin: Skin is warm and dry.  Psychiatric: She has a normal mood and affect.          Assessment & Plan:  1. Sinusitis - azithromycin (ZITHROMAX) 250 MG tablet; Take as directed  Dispense: 6 tablet; Refill: 0 -adequate fluids -RTO if symptoms worsen or unresolved -Patient verbalized understanding Coralie Keens, FNP-C

## 2013-03-31 ENCOUNTER — Encounter: Payer: Self-pay | Admitting: Family Medicine

## 2013-03-31 ENCOUNTER — Ambulatory Visit (INDEPENDENT_AMBULATORY_CARE_PROVIDER_SITE_OTHER): Payer: Medicare Other | Admitting: Family Medicine

## 2013-03-31 ENCOUNTER — Ambulatory Visit (INDEPENDENT_AMBULATORY_CARE_PROVIDER_SITE_OTHER): Payer: Medicare Other

## 2013-03-31 VITALS — BP 121/70 | HR 80 | Temp 99.8°F | Ht 61.5 in | Wt 142.0 lb

## 2013-03-31 DIAGNOSIS — J40 Bronchitis, not specified as acute or chronic: Secondary | ICD-10-CM

## 2013-03-31 DIAGNOSIS — R059 Cough, unspecified: Secondary | ICD-10-CM

## 2013-03-31 DIAGNOSIS — J111 Influenza due to unidentified influenza virus with other respiratory manifestations: Secondary | ICD-10-CM

## 2013-03-31 DIAGNOSIS — J101 Influenza due to other identified influenza virus with other respiratory manifestations: Secondary | ICD-10-CM

## 2013-03-31 DIAGNOSIS — R05 Cough: Secondary | ICD-10-CM

## 2013-03-31 MED ORDER — BENZONATATE 100 MG PO CAPS: 100.0000 mg | ORAL_CAPSULE | Freq: Three times a day (TID) | ORAL | Status: DC | PRN

## 2013-03-31 MED ORDER — METHYLPREDNISOLONE ACETATE 80 MG/ML IJ SUSP
80.0000 mg | Freq: Once | INTRAMUSCULAR | Status: AC
  Administered 2013-03-31: 80 mg via INTRAMUSCULAR

## 2013-03-31 MED ORDER — LEVOFLOXACIN 500 MG PO TABS: 500.0000 mg | ORAL_TABLET | Freq: Every day | ORAL | Status: DC

## 2013-03-31 MED ORDER — OSELTAMIVIR PHOSPHATE 75 MG PO CAPS: 75.0000 mg | ORAL_CAPSULE | Freq: Two times a day (BID) | ORAL | Status: DC

## 2013-03-31 MED ORDER — METHYLPREDNISOLONE ACETATE 40 MG/ML IJ SUSP: 80.0000 mg | Freq: Once | INTRAMUSCULAR | Status: DC

## 2013-03-31 NOTE — Progress Notes (Signed)
   Subjective:    Patient ID: [name] [name], female    DOB: June 05, 1931, 79 y.o.   MRN: [id number]  HPI  URI Symptoms Onset: 3-4 weeks  Description: persistent cough, mild wheezing, rhinorrhea, nasal congestion  Modifying factors:  Has completed 2 course of zpaks with no improvement in sxs.   Symptoms Nasal discharge: yes Fever: no Sore throat: no Cough: yes Wheezing: yes Ear pain: no GI symptoms: no Sick contacts: no  Red Flags  Stiff neck: no Dyspnea: mild Rash: no Swallowing difficulty: no  Sinusitis Risk Factors Headache/face pain: no Double sickening: no tooth pain: no  Allergy Risk Factors Sneezing: no Itchy scratchy throat: no Seasonal symptoms: no  Flu Risk Factors Headache: no muscle aches: no severe fatigue: no     Review of Systems  All other systems reviewed and are negative.       Objective:   Physical Exam  Constitutional: She appears well-developed and well-nourished.  HENT:  Head: Normocephalic and atraumatic.  Eyes: Conjunctivae are normal. Pupils are equal, round, and reactive to light.  Neck: Normal range of motion. Neck supple.  Cardiovascular: Normal rate, regular rhythm and normal heart sounds.   Pulmonary/Chest: Effort normal.  Faint wheezes in bases No resp distress   Abdominal: Soft.  Musculoskeletal: Normal range of motion.  Neurological: She is alert.  Skin: Skin is warm.      [facility] reading (PRIMARY) by  Dr. Alvester Morin                                  Preliminary CXR read: mild bronchitic changes with no focal PNA pending radiology read. Mild ? Hyperinflation.      Assessment & Plan:  Cough - Plan: DG Chest 2 View, POCT CBC, methylPREDNISolone acetate (DEPO-MEDROL) injection 80 mg, benzonatate (TESSALON) 100 MG capsule, POCT Influenza A/B  Bronchitis - Plan: levofloxacin (LEVAQUIN) 500 MG tablet, methylPREDNISolone acetate (DEPO-MEDROL) injection 80 mg, POCT Influenza A/B  Influenza A - Plan: oseltamivir (TAMIFLU)  75 MG capsule  Flu + today with likely secondary protracted URI/bronchitis Will place on course pf levaquin for resp coverage  Depomedrol 80mg  IM x1 give wheezing  Tamiflu.  Discussed infectious and resp red flags at length.  Follow up as needed.

## 2013-03-31 NOTE — Patient Instructions (Signed)

## 2013-03-31 NOTE — Addendum Note (Signed)
Addended by: Gwenith Daily on: 03/31/2013 04:02 PM   Modules accepted: Orders

## 2013-04-04 ENCOUNTER — Ambulatory Visit (INDEPENDENT_AMBULATORY_CARE_PROVIDER_SITE_OTHER): Payer: Medicare Other | Admitting: Family Medicine

## 2013-04-04 ENCOUNTER — Encounter: Payer: Self-pay | Admitting: Family Medicine

## 2013-04-04 ENCOUNTER — Telehealth: Payer: Self-pay | Admitting: *Deleted

## 2013-04-04 VITALS — BP 110/63 | HR 86 | Temp 97.4°F | Ht 61.5 in | Wt 142.0 lb

## 2013-04-04 DIAGNOSIS — R062 Wheezing: Secondary | ICD-10-CM

## 2013-04-04 DIAGNOSIS — J45901 Unspecified asthma with (acute) exacerbation: Secondary | ICD-10-CM

## 2013-04-04 DIAGNOSIS — J209 Acute bronchitis, unspecified: Secondary | ICD-10-CM

## 2013-04-04 LAB — POCT CBC
Granulocyte percent: 62.6 %G (ref 37–80)
Lymph, poc: 2.2 (ref 0.6–3.4)
MPV: 7.5 fL (ref 0–99.8)
POC Granulocyte: 4.3 (ref 2–6.9)
POC LYMPH PERCENT: 32.7 %L (ref 10–50)
Platelet Count, POC: 264 10*3/uL (ref 142–424)
RBC: 4.9 M/uL (ref 4.04–5.48)
WBC: 6.8 10*3/uL (ref 4.6–10.2)

## 2013-04-04 MED ORDER — ALBUTEROL SULFATE HFA 108 (90 BASE) MCG/ACT IN AERS: 2.0000 | INHALATION_SPRAY | Freq: Four times a day (QID) | RESPIRATORY_TRACT | Status: DC | PRN

## 2013-04-04 MED ORDER — PREDNISONE 10 MG PO TABS: ORAL_TABLET | ORAL | Status: DC

## 2013-04-04 MED ORDER — METHYLPREDNISOLONE ACETATE 40 MG/ML IJ SUSP
40.0000 mg | Freq: Once | INTRAMUSCULAR | Status: AC
  Administered 2013-04-04: 40 mg via INTRAMUSCULAR

## 2013-04-04 MED ORDER — METHYLPREDNISOLONE ACETATE 40 MG/ML IJ SUSP: 40.0000 mg | Freq: Once | INTRAMUSCULAR | Status: DC

## 2013-04-04 NOTE — Telephone Encounter (Signed)
Pt wanted to be seen for flu rck appt scheduled

## 2013-04-04 NOTE — Patient Instructions (Signed)
Keep drinking plenty of fluids Finish antibiotic and medication for the flu Use Symbicort regularly Use the new inhaler as needed for wheezing You can buy Mucinex blue and white maximum strength over-the-counter one twice daily for cough and congestion with a large glass of water Do not return to work until after next week Give yourself sometime to recover from this infection Take medication as directed

## 2013-04-04 NOTE — Addendum Note (Signed)
Addended by: Magdalene River on: 04/04/2013 03:41 PM   Modules accepted: Orders, Medications

## 2013-04-04 NOTE — Progress Notes (Signed)
Subjective:    Patient ID: [name] [name], female    DOB: [birth year], 79 y.o.   MRN: [id number]  HPI Patient here today for follow up from positive flu -A on Friday. Feeling some better, but still coughing. Patient has been taking Tamiflu and Levaquin. She will finish these medications today and tomorrow. The amount of sputum being coughed up is also reduced.     Patient Active Problem List   Diagnosis Date Noted  . hyperlipidemia   . Anxiety   . Disorder of bone and cartilage, unspecified   . Depressive disorder, not elsewhere classified   . Rhinitis   . Elevated blood sugar   . Atrophic vaginitis   . Postmenopausal    Outpatient Encounter Prescriptions as of 04/04/2013  Medication Sig  . ALPRAZolam (XANAX) 0.25 MG tablet Take 1 tablet (0.25 mg total) by mouth at bedtime as needed.  Marland Kitchen amLODipine (NORVASC) 5 MG tablet   . aspirin 81 MG EC tablet Take 81 mg by mouth daily.    . benzonatate (TESSALON) 100 MG capsule Take 1-2 capsules (100-200 mg total) by mouth 3 (three) times daily as needed for cough.  . Calcium Carbonate-Vitamin D (OSCAL 500/200 D-3 PO) Take 1 capsule by mouth 2 (two) times daily.    . Cholecalciferol (VITAMIN D) 2000 UNITS CAPS Take 1 capsule by mouth daily.    . citalopram (CELEXA) 20 MG tablet TAKE ONE TABLET BY MOUTH ONE TIME DAILY  . fluticasone (FLONASE) 50 MCG/ACT nasal spray 2 sprays by Nasal route daily.    Marland Kitchen levofloxacin (LEVAQUIN) 500 MG tablet Take 1 tablet (500 mg total) by mouth daily.  Marland Kitchen lovastatin (MEVACOR) 40 MG tablet TAKE ONE TABLET BY MOUTH ONE TIME DAILY  . oseltamivir (TAMIFLU) 75 MG capsule Take 1 capsule (75 mg total) by mouth 2 (two) times daily.    Review of Systems  Constitutional: Positive for fatigue. Negative for fever.  HENT: Positive for congestion and postnasal drip.   Eyes: Negative.   Respiratory: Positive for cough.   Cardiovascular: Negative.   Gastrointestinal: Negative.   Endocrine: Negative.   Genitourinary:  Negative.   Musculoskeletal: Negative.   Skin: Negative.   Allergic/Immunologic: Negative.   Neurological: Negative.   Hematological: Negative.   Psychiatric/Behavioral: Negative.        Objective:   Physical Exam  Nursing note and vitals reviewed. Constitutional: She is oriented to person, place, and time. She appears well-developed and well-nourished. No distress.  HENT:  Head: Normocephalic and atraumatic.  Right Ear: External ear normal.  Left Ear: External ear normal.  Nose: Nose normal.  Mouth/Throat: Oropharynx is clear and moist. No oropharyngeal exudate.  Eyes: Conjunctivae and EOM are normal. Pupils are equal, round, and reactive to light. Right eye exhibits no discharge. Left eye exhibits no discharge. No scleral icterus.  Neck: Normal range of motion. Neck supple. No thyromegaly present.  Cardiovascular: Normal rate, regular rhythm and normal heart sounds.  Exam reveals no gallop and no friction rub.   No murmur heard. Pulmonary/Chest: Effort normal. No respiratory distress. She has wheezes. She has no rales. She exhibits no tenderness.  Wheezes bilaterally with rhonchi and cough  Musculoskeletal: Normal range of motion.  Lymphadenopathy:    She has no cervical adenopathy.  Neurological: She is alert and oriented to person, place, and time.  Skin: Skin is warm and dry. No rash noted. She is not diaphoretic.  Psychiatric: She has a normal mood and affect. Her behavior is normal. Judgment  and thought content normal.   BP 110/63  Pulse 86  Temp(Src) 97.4 F (36.3 C) (Oral)  Ht 5' 1.5" (1.562 m)  Wt 142 lb (64.411 kg)  BMI 26.40 kg/m2        Assessment & Plan:  1. Wheezing - methylPREDNISolone acetate (DEPO-MEDROL) 40 MG/ML injection; Inject 1 mL (40 mg total) into the muscle once.  Dispense: 1 mL; Refill: 0 - predniSONE (DELTASONE) 10 MG tablet; 1 tablet 4 times a day for 2 days,  1 tablet 3 times a day for 2 days,  1 tablet 2 times a day for 2 days, 1  tablet daily for 2 days  Dispense: 20 tablet; Refill: 0 - albuterol (PROVENTIL HFA;VENTOLIN HFA) 108 (90 BASE) MCG/ACT inhaler; Inhale 2 puffs into the lungs every 6 (six) hours as needed for wheezing or shortness of breath.  Dispense: 1 Inhaler; Refill: 2  2. Asthma with acute exacerbation - POCT CBC - methylPREDNISolone acetate (DEPO-MEDROL) 40 MG/ML injection; Inject 1 mL (40 mg total) into the muscle once.  Dispense: 1 mL; Refill: 0 - predniSONE (DELTASONE) 10 MG tablet; 1 tablet 4 times a day for 2 days,  1 tablet 3 times a day for 2 days,  1 tablet 2 times a day for 2 days, 1 tablet daily for 2 days  Dispense: 20 tablet; Refill: 0 - albuterol (PROVENTIL HFA;VENTOLIN HFA) 108 (90 BASE) MCG/ACT inhaler; Inhale 2 puffs into the lungs every 6 (six) hours as needed for wheezing or shortness of breath.  Dispense: 1 Inhaler; Refill: 2  3. Acute bronchitis - POCT CBC - albuterol (PROVENTIL HFA;VENTOLIN HFA) 108 (90 BASE) MCG/ACT inhaler; Inhale 2 puffs into the lungs every 6 (six) hours as needed for wheezing or shortness of breath.  Dispense: 1 Inhaler; Refill: 2  Patient Instructions  Keep drinking plenty of fluids Finish antibiotic and medication for the flu Use Symbicort regularly Use the new inhaler as needed for wheezing You can buy Mucinex blue and white maximum strength over-the-counter one twice daily for cough and congestion with a large glass of water Do not return to work until after next week Give yourself sometime to recover from this infection Take medication as directed   Nyra Capes MD

## 2013-04-04 NOTE — Addendum Note (Signed)
Addended by: Ernestina Penna on: 04/04/2013 03:45 PM   Modules accepted: Orders

## 2013-04-09 ENCOUNTER — Other Ambulatory Visit: Payer: Self-pay | Admitting: Family Medicine

## 2013-04-18 ENCOUNTER — Other Ambulatory Visit (INDEPENDENT_AMBULATORY_CARE_PROVIDER_SITE_OTHER): Payer: Medicare Other

## 2013-04-18 DIAGNOSIS — E559 Vitamin D deficiency, unspecified: Secondary | ICD-10-CM

## 2013-04-18 DIAGNOSIS — E785 Hyperlipidemia, unspecified: Secondary | ICD-10-CM

## 2013-04-18 DIAGNOSIS — I10 Essential (primary) hypertension: Secondary | ICD-10-CM

## 2013-04-18 LAB — POCT CBC
GRANULOCYTE PERCENT: 72 % (ref 37–80)
HCT, POC: 42.9 % (ref 37.7–47.9)
HEMOGLOBIN: 13.8 g/dL (ref 12.2–16.2)
Lymph, poc: 2 (ref 0.6–3.4)
MCH, POC: 30.1 pg (ref 27–31.2)
MCHC: 32.2 g/dL (ref 31.8–35.4)
MCV: 93.6 fL (ref 80–97)
MPV: 8.6 fL (ref 0–99.8)
POC GRANULOCYTE: 6.6 (ref 2–6.9)
POC LYMPH PERCENT: 22 %L (ref 10–50)
Platelet Count, POC: 247 10*3/uL (ref 142–424)
RBC: 4.6 M/uL (ref 4.04–5.48)
RDW, POC: 14.4 %
WBC: 9.1 10*3/uL (ref 4.6–10.2)

## 2013-04-18 NOTE — Progress Notes (Signed)
Pt came in for labs only 

## 2013-04-19 ENCOUNTER — Ambulatory Visit: Payer: Medicare Other | Admitting: Family Medicine

## 2013-04-19 LAB — BMP8+EGFR
BUN / CREAT RATIO: 20 (ref 11–26)
BUN: 14 mg/dL (ref 8–27)
CHLORIDE: 99 mmol/L (ref 97–108)
CO2: 23 mmol/L (ref 18–29)
CREATININE: 0.69 mg/dL (ref 0.57–1.00)
Calcium: 9.1 mg/dL (ref 8.6–10.2)
GFR, EST AFRICAN AMERICAN: 96 mL/min/{1.73_m2} (ref >59)
GFR, EST NON AFRICAN AMERICAN: 83 mL/min/{1.73_m2} (ref >59)
GLUCOSE: 97 mg/dL (ref 65–99)
Potassium: 4.5 mmol/L (ref 3.5–5.2)
Sodium: 139 mmol/L (ref 134–144)

## 2013-04-19 LAB — NMR, LIPOPROFILE
Cholesterol: 167 mg/dL (ref <200)
HDL Cholesterol by NMR: 69 mg/dL (ref >=40)
HDL PARTICLE NUMBER: 41.7 umol/L (ref >=30.5)
LDL Particle Number: 1137 nmol/L — ABNORMAL HIGH (ref <1000)
LDL SIZE: 20.9 nm (ref >20.5)
LDLC SERPL CALC-MCNC: 82 mg/dL (ref <100)
LP-IR SCORE: 50 — AB (ref <=45)
Small LDL Particle Number: 568 nmol/L — ABNORMAL HIGH (ref <=527)
Triglycerides by NMR: 82 mg/dL (ref <150)

## 2013-04-19 LAB — HEPATIC FUNCTION PANEL
ALK PHOS: 62 IU/L (ref 39–117)
ALT: 19 IU/L (ref 0–32)
AST: 16 IU/L (ref 0–40)
Albumin: 3.8 g/dL (ref 3.5–4.8)
BILIRUBIN DIRECT: 0.19 mg/dL (ref 0.00–0.40)
Total Bilirubin: 0.7 mg/dL (ref 0.0–1.2)
Total Protein: 5.8 g/dL — ABNORMAL LOW (ref 6.0–8.5)

## 2013-04-19 LAB — VITAMIN D 25 HYDROXY (VIT D DEFICIENCY, FRACTURES): Vit D, 25-Hydroxy: 42.5 ng/mL (ref 30.0–100.0)

## 2013-04-24 ENCOUNTER — Encounter: Payer: Self-pay | Admitting: Family Medicine

## 2013-04-24 ENCOUNTER — Ambulatory Visit (INDEPENDENT_AMBULATORY_CARE_PROVIDER_SITE_OTHER): Payer: Medicare Other | Admitting: Family Medicine

## 2013-04-24 VITALS — BP 116/61 | HR 64 | Temp 98.4°F | Ht 61.5 in | Wt 147.0 lb

## 2013-04-24 DIAGNOSIS — Z23 Encounter for immunization: Secondary | ICD-10-CM

## 2013-04-24 DIAGNOSIS — R739 Hyperglycemia, unspecified: Secondary | ICD-10-CM

## 2013-04-24 DIAGNOSIS — R7309 Other abnormal glucose: Secondary | ICD-10-CM

## 2013-04-24 DIAGNOSIS — Z780 Asymptomatic menopausal state: Secondary | ICD-10-CM

## 2013-04-24 DIAGNOSIS — J309 Allergic rhinitis, unspecified: Secondary | ICD-10-CM

## 2013-04-24 DIAGNOSIS — E785 Hyperlipidemia, unspecified: Secondary | ICD-10-CM

## 2013-04-24 DIAGNOSIS — J40 Bronchitis, not specified as acute or chronic: Secondary | ICD-10-CM

## 2013-04-24 NOTE — Patient Instructions (Addendum)
Continue current medications. Continue good therapeutic lifestyle changes which include good diet and exercise. Fall precautions discussed with patient. Schedule your flu vaccine if you haven't had it yet If you are over 78 years old - you may need Prevnar 13 or the adult Pneumonia vaccine. Continue to drink plenty of fluids and use a, mist humidifier in her bedroom at night or twice daily for another couple weeks Get the saline nose spray and use this during the day frequently on both sides of the nose The Prevnar vaccine may make her arm is sore. Return the FOBT Do not forget to schedule yourself for a mammogram.

## 2013-04-24 NOTE — Addendum Note (Signed)
Addended by: Zannie Cove on: 04/24/2013 04:20 PM   Modules accepted: Orders

## 2013-04-24 NOTE — Progress Notes (Signed)
Subjective:    Patient ID: [name] [name], female    DOB: 12-Mar-1932, 80 y.o.   MRN: [id number]  HPI Pt here for follow up and management of chronic medical problems. Patient is here and has few complaints except for drainage from her head. He appears to have recovered well from the bronchitis. She is in good spirits today. Recent labs were reviewed with the patient during the visit today.       Patient Active Problem List   Diagnosis Date Noted  . hyperlipidemia   . Anxiety   . Disorder of bone and cartilage, unspecified   . Depressive disorder, not elsewhere classified   . Rhinitis   . Elevated blood sugar   . Atrophic vaginitis   . Postmenopausal    Outpatient Encounter Prescriptions as of 04/24/2013  Medication Sig  . albuterol (PROVENTIL HFA;VENTOLIN HFA) 108 (90 BASE) MCG/ACT inhaler Inhale 2 puffs into the lungs every 6 (six) hours as needed for wheezing or shortness of breath.  . ALPRAZolam (XANAX) 0.25 MG tablet Take 1 tablet (0.25 mg total) by mouth at bedtime as needed.  Marland Kitchen amLODipine (NORVASC) 5 MG tablet   . aspirin 81 MG EC tablet Take 81 mg by mouth daily.    . Calcium Carbonate-Vitamin D (OSCAL 500/200 D-3 PO) Take 1 capsule by mouth 2 (two) times daily.    . Cholecalciferol (VITAMIN D) 2000 UNITS CAPS Take 1 capsule by mouth daily.    . citalopram (CELEXA) 20 MG tablet TAKE ONE TABLET BY MOUTH ONE TIME DAILY  . fluticasone (FLONASE) 50 MCG/ACT nasal spray 2 sprays by Nasal route daily.    Marland Kitchen lovastatin (MEVACOR) 40 MG tablet TAKE ONE TABLET BY MOUTH ONE TIME DAILY  . [DISCONTINUED] benzonatate (TESSALON) 100 MG capsule Take 1-2 capsules (100-200 mg total) by mouth 3 (three) times daily as needed for cough.  . [DISCONTINUED] levofloxacin (LEVAQUIN) 500 MG tablet Take 1 tablet (500 mg total) by mouth daily.  . [DISCONTINUED] oseltamivir (TAMIFLU) 75 MG capsule Take 1 capsule (75 mg total) by mouth 2 (two) times daily.  . [DISCONTINUED] predniSONE (DELTASONE) 10 MG  tablet 1 tablet 4 times a day for 2 days,  1 tablet 3 times a day for 2 days,  1 tablet 2 times a day for 2 days, 1 tablet daily for 2 days    Review of Systems  Constitutional: Negative.   HENT: Positive for postnasal drip.   Eyes: Negative.   Respiratory: Negative.   Cardiovascular: Negative.   Gastrointestinal: Negative.   Endocrine: Negative.   Genitourinary: Negative.   Musculoskeletal: Negative.   Skin: Negative.   Allergic/Immunologic: Negative.   Neurological: Negative.   Hematological: Negative.   Psychiatric/Behavioral: Negative.        Objective:   Physical Exam  Nursing note and vitals reviewed. Constitutional: She is oriented to person, place, and time. She appears well-developed and well-nourished. No distress.  HENT:  Head: Normocephalic and atraumatic.  Right Ear: External ear normal.  Left Ear: External ear normal.  Mouth/Throat: Oropharynx is clear and moist. No oropharyngeal exudate.  Some mild nasal congestion bilaterally  Eyes: Conjunctivae and EOM are normal. Pupils are equal, round, and reactive to light. Right eye exhibits no discharge. Left eye exhibits no discharge. No scleral icterus.  Neck: Normal range of motion. Neck supple. No JVD present. No thyromegaly present.  No carotid bruits  Cardiovascular: Normal rate, regular rhythm, normal heart sounds and intact distal pulses.  Exam reveals no gallop and  no friction rub.   No murmur heard. At 60 per minute  Pulmonary/Chest: Effort normal and breath sounds normal. No respiratory distress. She has no wheezes. She has no rales. She exhibits no tenderness.  Lungs are much clearer and patient has a relatively dry cough   Abdominal: Soft. Bowel sounds are normal. She exhibits no mass. There is no tenderness. There is no rebound and no guarding.  Musculoskeletal: Normal range of motion. She exhibits no edema and no tenderness.  Lymphadenopathy:    She has no cervical adenopathy.  Neurological: She is  alert and oriented to person, place, and time. She has normal reflexes. No cranial nerve deficit.  Skin: Skin is warm and dry.  Psychiatric: She has a normal mood and affect. Her behavior is normal. Judgment and thought content normal.   BP 116/61  Pulse 64  Temp(Src) 98.4 F (36.9 C) (Oral)  Ht 5' 1.5" (1.562 m)  Wt 147 lb (66.679 kg)  BMI 27.33 kg/m2        Assessment & Plan:  1. hyperlipidemia  2. Postmenopausal  3. Elevated blood sugar  4. Allergic rhinitis  5. Bronchitis, resolved  Patient will be given the Prevnar vaccine today.  Patient Instructions  Continue current medications. Continue good therapeutic lifestyle changes which include good diet and exercise. Fall precautions discussed with patient. Schedule your flu vaccine if you haven't had it yet If you are over 78 years old - you may need Prevnar 4 or the adult Pneumonia vaccine. Continue to drink plenty of fluids and use a, mist humidifier in her bedroom at night or twice daily for another couple weeks Get the saline nose spray and use this during the day frequently on both sides of the nose The Prevnar vaccine may make her arm is sore. Return the FOBT Do not forget to schedule yourself for a mammogram.    Arrie Senate MD

## 2013-04-26 ENCOUNTER — Other Ambulatory Visit: Payer: Medicare Other

## 2013-04-27 LAB — FECAL OCCULT BLOOD, IMMUNOCHEMICAL: Fecal Occult Bld: NEGATIVE

## 2013-05-02 ENCOUNTER — Encounter: Payer: Self-pay | Admitting: *Deleted

## 2013-05-02 ENCOUNTER — Telehealth: Payer: Self-pay | Admitting: Family Medicine

## 2013-05-02 NOTE — Telephone Encounter (Signed)
PT AWARE OF FOBT NEGATIVE.  RS

## 2013-05-02 NOTE — Telephone Encounter (Signed)
Message copied by Waverly Ferrari on Tue May 02, 2013 11:02 AM ------      Message from: Chipper Herb      Created: Thu Apr 27, 2013  6:04 PM       The FOBT was negative ------

## 2013-05-02 NOTE — Progress Notes (Signed)
Quick Note:  Copy of labs sent to patient ______ 

## 2013-07-07 ENCOUNTER — Other Ambulatory Visit: Payer: Self-pay | Admitting: Family Medicine

## 2013-07-17 ENCOUNTER — Ambulatory Visit (INDEPENDENT_AMBULATORY_CARE_PROVIDER_SITE_OTHER): Payer: Medicare Other

## 2013-07-17 ENCOUNTER — Ambulatory Visit (INDEPENDENT_AMBULATORY_CARE_PROVIDER_SITE_OTHER): Payer: Medicare Other | Admitting: Family Medicine

## 2013-07-17 VITALS — BP 124/64 | HR 62 | Temp 96.9°F | Ht 61.5 in | Wt 151.8 lb

## 2013-07-17 DIAGNOSIS — S6990XA Unspecified injury of unspecified wrist, hand and finger(s), initial encounter: Secondary | ICD-10-CM

## 2013-07-17 NOTE — Progress Notes (Signed)
   Subjective:    Patient ID: [name] [name], female    DOB: [birth year], 80 y.o.   MRN: [id number]  HPI This 80 y.o. female presents for evaluation of right hand pain after falling onto right palm. She states it hurts to make fist.   Review of Systems C/o right hand discomfort. No chest pain, SOB, HA, dizziness, vision change, N/V, diarrhea, constipation, dysuria, urinary urgency or frequency or rash.     Objective:   Physical Exam  Vital signs noted  Well developed well nourished female.  HEENT - Head atraumatic Normocephalic                Eyes - PERRLA, Conjuctiva - clear Sclera- Clear EOMI                Ears - EAC's Wnl TM's Wnl Gross Hearing WNL                Throat - oropharanx wnl Respiratory - Lungs CTA bilateral Cardiac - RRR S1 and S2 without murmur. MS - TTP right hand w/o swelling deformity or loss of ROM  Xray right hand - No fracture Prelimnary reading by [facility]:   Hand injury - Plan: DG Hand Complete Right Aleve otc for a week and follow up prn  Lysbeth Penner FNP

## 2013-07-27 ENCOUNTER — Other Ambulatory Visit: Payer: Self-pay | Admitting: Family Medicine

## 2013-07-28 NOTE — Telephone Encounter (Signed)
Last seen 04/24/13, last filled 06/28/13. Route to pool A, call into [facility]

## 2013-07-28 NOTE — Telephone Encounter (Signed)
is okay to refill 

## 2013-07-31 ENCOUNTER — Other Ambulatory Visit: Payer: Self-pay | Admitting: Family Medicine

## 2013-07-31 NOTE — Telephone Encounter (Signed)
Left refill authorization on voicemial

## 2013-08-01 NOTE — Telephone Encounter (Signed)
Last seen 04/24/13  DWM  If approved route to nurse to call into [facility]

## 2013-08-02 NOTE — Telephone Encounter (Signed)
Left refill authorization on voicemail

## 2013-08-15 ENCOUNTER — Other Ambulatory Visit (INDEPENDENT_AMBULATORY_CARE_PROVIDER_SITE_OTHER): Payer: Medicare Other

## 2013-08-15 DIAGNOSIS — E875 Hyperkalemia: Secondary | ICD-10-CM

## 2013-08-15 LAB — POCT CBC
Granulocyte percent: 70 %G (ref 37–80)
HCT, POC: 44.3 % (ref 37.7–47.9)
HEMOGLOBIN: 14.3 g/dL (ref 12.2–16.2)
Lymph, poc: 1.9 (ref 0.6–3.4)
MCH: 30.4 pg (ref 27–31.2)
MCHC: 32.3 g/dL (ref 31.8–35.4)
MCV: 94 fL (ref 80–97)
MPV: 9.5 fL (ref 0–99.8)
POC Granulocyte: 5 (ref 2–6.9)
POC LYMPH PERCENT: 26.9 %L (ref 10–50)
Platelet Count, POC: 240 10*3/uL (ref 142–424)
RBC: 4.7 M/uL (ref 4.04–5.48)
RDW, POC: 13.1 %
WBC: 7.2 10*3/uL (ref 4.6–10.2)

## 2013-08-15 NOTE — Addendum Note (Signed)
Addended by: Earlene Plater on: 08/15/2013 08:04 AM   Modules accepted: Orders

## 2013-08-15 NOTE — Progress Notes (Signed)
Pt came in for labs only 

## 2013-08-16 LAB — BMP8+EGFR
BUN/Creatinine Ratio: 25 (ref 11–26)
BUN: 21 mg/dL (ref 8–27)
CO2: 24 mmol/L (ref 18–29)
Calcium: 9.6 mg/dL (ref 8.7–10.3)
Chloride: 102 mmol/L (ref 97–108)
Creatinine, Ser: 0.83 mg/dL (ref 0.57–1.00)
GFR calc non Af Amer: 67 mL/min/{1.73_m2} (ref >59)
GFR, EST AFRICAN AMERICAN: 78 mL/min/{1.73_m2} (ref >59)
GLUCOSE: 100 mg/dL — AB (ref 65–99)
Potassium: 4.5 mmol/L (ref 3.5–5.2)
Sodium: 141 mmol/L (ref 134–144)

## 2013-08-16 LAB — NMR, LIPOPROFILE
Cholesterol: 138 mg/dL (ref <200)
HDL Cholesterol by NMR: 57 mg/dL (ref >=40)
HDL Particle Number: 30.4 umol/L — ABNORMAL LOW (ref >=30.5)
LDL Particle Number: 750 nmol/L (ref <1000)
LDL SIZE: 21 nm (ref >20.5)
LDLC SERPL CALC-MCNC: 67 mg/dL (ref <100)
LP-IR Score: <25 (ref <=45)
Small LDL Particle Number: 264 nmol/L (ref <=527)
TRIGLYCERIDES BY NMR: 71 mg/dL (ref <150)

## 2013-08-16 LAB — HEPATIC FUNCTION PANEL
ALT: 15 IU/L (ref 0–32)
AST: 17 IU/L (ref 0–40)
Albumin: 4.2 g/dL (ref 3.5–4.8)
Alkaline Phosphatase: 81 IU/L (ref 39–117)
BILIRUBIN DIRECT: 0.18 mg/dL (ref 0.00–0.40)
BILIRUBIN TOTAL: 0.7 mg/dL (ref 0.0–1.2)
Total Protein: 6.8 g/dL (ref 6.0–8.5)

## 2013-08-16 LAB — VITAMIN D 25 HYDROXY (VIT D DEFICIENCY, FRACTURES): VIT D 25 HYDROXY: 57.9 ng/mL (ref 30.0–100.0)

## 2013-08-21 ENCOUNTER — Encounter: Payer: Self-pay | Admitting: Family Medicine

## 2013-08-21 ENCOUNTER — Ambulatory Visit (INDEPENDENT_AMBULATORY_CARE_PROVIDER_SITE_OTHER): Payer: Medicare Other | Admitting: Family Medicine

## 2013-08-21 VITALS — BP 134/68 | HR 74 | Temp 98.2°F | Ht 61.5 in | Wt 149.0 lb

## 2013-08-21 DIAGNOSIS — L089 Local infection of the skin and subcutaneous tissue, unspecified: Secondary | ICD-10-CM

## 2013-08-21 DIAGNOSIS — E785 Hyperlipidemia, unspecified: Secondary | ICD-10-CM

## 2013-08-21 DIAGNOSIS — B9689 Other specified bacterial agents as the cause of diseases classified elsewhere: Secondary | ICD-10-CM

## 2013-08-21 DIAGNOSIS — R7309 Other abnormal glucose: Secondary | ICD-10-CM

## 2013-08-21 DIAGNOSIS — R739 Hyperglycemia, unspecified: Secondary | ICD-10-CM

## 2013-08-21 DIAGNOSIS — Z13820 Encounter for screening for osteoporosis: Secondary | ICD-10-CM

## 2013-08-21 DIAGNOSIS — A499 Bacterial infection, unspecified: Secondary | ICD-10-CM

## 2013-08-21 MED ORDER — DOXYCYCLINE HYCLATE 100 MG PO TABS: 100.0000 mg | ORAL_TABLET | Freq: Two times a day (BID) | ORAL | Status: DC

## 2013-08-21 NOTE — Progress Notes (Signed)
Subjective:    Patient ID: [name] [name], female    DOB: 10-11-1931, 80 y.o.   MRN: [id number]  HPI Pt here for follow up and management of chronic medical problems. The patient does complain of some sores on her arms and back that seem to come and go and did not completely heal up. These have been going on for a while here        Patient Active Problem List   Diagnosis Date Noted  . hyperlipidemia   . Anxiety   . Disorder of bone and cartilage, unspecified   . Depressive disorder, not elsewhere classified   . Rhinitis   . Elevated blood sugar   . Atrophic vaginitis   . Postmenopausal    Outpatient Encounter Prescriptions as of 08/21/2013  Medication Sig  . albuterol (PROVENTIL HFA;VENTOLIN HFA) 108 (90 BASE) MCG/ACT inhaler Inhale 2 puffs into the lungs every 6 (six) hours as needed for wheezing or shortness of breath.  . ALPRAZolam (XANAX) 0.25 MG tablet TAKE ONE TABLET BY MOUTH AT BEDTIME AS NEEDED  . amLODipine (NORVASC) 5 MG tablet Take 5 mg by mouth daily.   Marland Kitchen aspirin 81 MG EC tablet Take 81 mg by mouth daily.    . Calcium Carbonate-Vitamin D (OSCAL 500/200 D-3 PO) Take 1 capsule by mouth 2 (two) times daily.    . Cholecalciferol (VITAMIN D) 2000 UNITS CAPS Take 1 capsule by mouth daily.    . citalopram (CELEXA) 20 MG tablet TAKE ONE TABLET BY MOUTH ONE TIME DAILY  . fluticasone (FLONASE) 50 MCG/ACT nasal spray 2 sprays by Nasal route daily.    Marland Kitchen lovastatin (MEVACOR) 40 MG tablet TAKE ONE TABLET BY MOUTH ONE TIME DAILY    Review of Systems  Constitutional: Negative.   HENT: Negative.   Eyes: Negative.   Respiratory: Negative.   Cardiovascular: Negative.   Gastrointestinal: Negative.   Endocrine: Negative.   Genitourinary: Negative.   Musculoskeletal: Negative.   Skin: Negative.   Allergic/Immunologic: Negative.   Neurological: Negative.   Hematological: Negative.   Psychiatric/Behavioral: Negative.        Objective:   Physical Exam  Nursing note and  vitals reviewed. Constitutional: She is oriented to person, place, and time. No distress.  Thin and tiny and normal appearing for her age  HENT:  Head: Normocephalic and atraumatic.  Right Ear: External ear normal.  Left Ear: External ear normal.  Nose: Nose normal.  Mouth/Throat: Oropharynx is clear and moist.  Eyes: Conjunctivae and EOM are normal. Pupils are equal, round, and reactive to light. Right eye exhibits no discharge. Left eye exhibits no discharge. No scleral icterus.  Neck: Normal range of motion. Neck supple. No thyromegaly present.   No carotid bruits  Cardiovascular: Normal rate, regular rhythm, normal heart sounds and intact distal pulses.  Exam reveals no gallop and no friction rub.   No murmur heard. At 72 per minute  Pulmonary/Chest: Effort normal. No respiratory distress. She has wheezes (a few sparse wheezes). She has no rales. She exhibits no tenderness.  Abdominal: Soft. Bowel sounds are normal. She exhibits no mass. There is no tenderness. There is no rebound and no guarding.  Musculoskeletal: Normal range of motion. She exhibits no edema and no tenderness.  Lymphadenopathy:    She has no cervical adenopathy.  Neurological: She is alert and oriented to person, place, and time. No cranial nerve deficit.  Skin: Skin is warm and dry. Rash noted. There is erythema.  Small excoriated areas  on back and arms and some of these areas appear to be healed but scarred.  Psychiatric: She has a normal mood and affect. Her behavior is normal. Judgment and thought content normal.   BP 134/68  Pulse 74  Temp(Src) 98.2 F (36.8 C) (Oral)  Ht 5' 1.5" (1.562 m)  Wt 149 lb (67.586 kg)  BMI 27.70 kg/m2        Assessment & Plan:   1. hyperlipidemia -Excellent control continue current treatment  2. Elevated blood sugar -Continue dietary measures and exercise  3. Osteoporosis screening - DG Bone Density; Future  4. Skin infection, bacterial - doxycycline (VIBRA-TABS)  100 MG tablet; Take 1 tablet (100 mg total) by mouth 2 (two) times daily.  Dispense: 20 tablet; Refill: 0  Patient Instructions                       Medicare Annual Wellness Visit  [facility] and the medical providers at [facility] strive to bring you the best medical care.  In doing so we not only want to address your current medical conditions and concerns but also to detect new conditions early and prevent illness, disease and health-related problems.    Medicare offers a yearly Wellness Visit which allows our clinical staff to assess your need for preventative services including immunizations, lifestyle education, counseling to decrease risk of preventable diseases and screening for fall risk and other medical concerns.    This visit is provided free of charge (no copay) for all Medicare recipients. The clinical pharmacists at [facility] have begun to conduct these Wellness Visits which will also include a thorough review of all your medications.    As you primary medical provider recommend that you make an appointment for your Annual Wellness Visit if you have not done so already this year.  You may set up this appointment before you leave today or you may call back ([phone]) and schedule an appointment.  Please make sure when you call that you mention that you are scheduling your Annual Wellness Visit with the clinical pharmacist so that the appointment may be made for the proper length of time.       Continue current medications. Continue good therapeutic lifestyle changes which include good diet and exercise. Fall precautions discussed with patient. If an FOBT was given today- please return it to our front desk. If you are over 37 years old - you may need Prevnar 88 or the adult Pneumonia vaccine.  Take antibiotic as directed Keep areas on skin cleaned with peroxide and use Polysporin ointment   Arrie Senate MD

## 2013-08-21 NOTE — Patient Instructions (Addendum)
Medicare Annual Wellness Visit  [name] [name] and the medical providers at [facility] strive to bring you the best medical care.  In doing so we not only want to address your current medical conditions and concerns but also to detect new conditions early and prevent illness, disease and health-related problems.    Medicare offers a yearly Wellness Visit which allows our clinical staff to assess your need for preventative services including immunizations, lifestyle education, counseling to decrease risk of preventable diseases and screening for fall risk and other medical concerns.    This visit is provided free of charge (no copay) for all Medicare recipients. The clinical pharmacists at [facility] [place] have begun to conduct these Wellness Visits which will also include a thorough review of all your medications.    As you primary medical provider recommend that you make an appointment for your Annual Wellness Visit if you have not done so already this year.  You may set up this appointment before you leave today or you may call back [phone]) and schedule an appointment.  Please make sure when you call that you mention that you are scheduling your Annual Wellness Visit with the clinical pharmacist so that the appointment may be made for the proper length of time.       Continue current medications. Continue good therapeutic lifestyle changes which include good diet and exercise. Fall precautions discussed with patient. If an FOBT was given today- please return it to our front desk. If you are over 23 years old - you may need Prevnar 53 or the adult Pneumonia vaccine.  Take antibiotic as directed Keep areas on skin cleaned with peroxide and use Polysporin ointment

## 2013-08-28 ENCOUNTER — Other Ambulatory Visit: Payer: Self-pay | Admitting: *Deleted

## 2013-08-28 DIAGNOSIS — R21 Rash and other nonspecific skin eruption: Secondary | ICD-10-CM

## 2013-09-05 ENCOUNTER — Telehealth: Payer: Self-pay | Admitting: Physician Assistant

## 2013-09-05 NOTE — Telephone Encounter (Signed)
appt made

## 2013-09-06 ENCOUNTER — Encounter: Payer: Self-pay | Admitting: Physician Assistant

## 2013-09-06 ENCOUNTER — Ambulatory Visit (INDEPENDENT_AMBULATORY_CARE_PROVIDER_SITE_OTHER): Payer: Medicare Other | Admitting: Physician Assistant

## 2013-09-06 VITALS — BP 103/59 | HR 87 | Temp 97.8°F | Ht 61.5 in | Wt 151.0 lb

## 2013-09-06 DIAGNOSIS — B9689 Other specified bacterial agents as the cause of diseases classified elsewhere: Secondary | ICD-10-CM

## 2013-09-06 DIAGNOSIS — A499 Bacterial infection, unspecified: Secondary | ICD-10-CM

## 2013-09-06 DIAGNOSIS — J069 Acute upper respiratory infection, unspecified: Secondary | ICD-10-CM

## 2013-09-06 DIAGNOSIS — L089 Local infection of the skin and subcutaneous tissue, unspecified: Secondary | ICD-10-CM

## 2013-09-06 MED ORDER — DOXYCYCLINE HYCLATE 100 MG PO TABS: 100.0000 mg | ORAL_TABLET | Freq: Two times a day (BID) | ORAL | Status: DC

## 2013-09-06 MED ORDER — BENZONATATE 200 MG PO CAPS: 200.0000 mg | ORAL_CAPSULE | Freq: Two times a day (BID) | ORAL | Status: DC | PRN

## 2013-09-06 NOTE — Progress Notes (Signed)
Subjective:     Patient ID: [name], female   DOB: Nov 06, 1931, 80 y.o.   MRN: [id number]  HPI Pt with cough, congestion, and sinus pressure x 1 week Using OTC cough med  Review of Systems  Constitutional: Positive for activity change and fatigue. Negative for fever and chills.  HENT: Positive for congestion, postnasal drip, rhinorrhea and sinus pressure. Negative for ear discharge, ear pain, sneezing and sore throat.   Eyes: Negative.   Respiratory: Positive for cough. Negative for chest tightness, shortness of breath and wheezing.   Cardiovascular: Negative.        Objective:   Physical Exam Ears- TM's and canal nl bilat Oral- no lesions or increase in tonsil size + PND clear No cerv nodes Heart- RRR w/o M Lungs- Coarse lung sounds that clear well with cough    Assessment:     Acute URI    Plan:     Fluids Rest Due to multiple allergies Doxycycline 100mg  bid x 10 days Tessalon for cough F/U prn

## 2013-09-06 NOTE — Patient Instructions (Signed)

## 2013-09-11 ENCOUNTER — Other Ambulatory Visit: Payer: Self-pay | Admitting: Family Medicine

## 2013-09-15 ENCOUNTER — Other Ambulatory Visit: Payer: Self-pay | Admitting: Family Medicine

## 2013-09-18 ENCOUNTER — Telehealth: Payer: Self-pay | Admitting: Family Medicine

## 2013-09-18 NOTE — Telephone Encounter (Signed)
appt given for tomorrow with [facility]

## 2013-09-19 ENCOUNTER — Ambulatory Visit (INDEPENDENT_AMBULATORY_CARE_PROVIDER_SITE_OTHER): Payer: Medicare Other | Admitting: Family Medicine

## 2013-09-19 ENCOUNTER — Encounter: Payer: Self-pay | Admitting: Family Medicine

## 2013-09-19 ENCOUNTER — Ambulatory Visit (INDEPENDENT_AMBULATORY_CARE_PROVIDER_SITE_OTHER): Payer: Medicare Other

## 2013-09-19 VITALS — BP 95/55 | HR 74 | Temp 98.0°F | Ht 62.0 in | Wt 152.0 lb

## 2013-09-19 DIAGNOSIS — R61 Generalized hyperhidrosis: Secondary | ICD-10-CM

## 2013-09-19 DIAGNOSIS — R05 Cough: Secondary | ICD-10-CM

## 2013-09-19 DIAGNOSIS — J9801 Acute bronchospasm: Secondary | ICD-10-CM

## 2013-09-19 DIAGNOSIS — R059 Cough, unspecified: Secondary | ICD-10-CM

## 2013-09-19 LAB — POCT CBC
GRANULOCYTE PERCENT: 68.4 % (ref 37–80)
HCT, POC: 40.3 % (ref 37.7–47.9)
Hemoglobin: 12.9 g/dL (ref 12.2–16.2)
LYMPH, POC: 2.1 (ref 0.6–3.4)
MCH: 29.9 pg (ref 27–31.2)
MCHC: 32.1 g/dL (ref 31.8–35.4)
MCV: 93.2 fL (ref 80–97)
MPV: 8.4 fL (ref 0–99.8)
PLATELET COUNT, POC: 261 10*3/uL (ref 142–424)
POC Granulocyte: 5.5 (ref 2–6.9)
POC LYMPH %: 26.7 % (ref 10–50)
RBC: 4.3 M/uL (ref 4.04–5.48)
RDW, POC: 13.8 %
WBC: 8 10*3/uL (ref 4.6–10.2)

## 2013-09-19 MED ORDER — PREDNISONE 10 MG PO TABS: ORAL_TABLET | ORAL | Status: DC

## 2013-09-19 MED ORDER — METHYLPREDNISOLONE ACETATE 80 MG/ML IJ SUSP
60.0000 mg | Freq: Once | INTRAMUSCULAR | Status: AC
  Administered 2013-09-19: 60 mg via INTRAMUSCULAR

## 2013-09-19 NOTE — Patient Instructions (Addendum)
Continue to drink plenty of fluids Take medication as directed Use the Symbicort inhaler more regularly and continue to use the rescue inhaler as needed Continue Mucinex We will have the clinical pharmacists to review your medication list to see if any of the other medications could be playing a role with your increased sweating

## 2013-09-19 NOTE — Progress Notes (Signed)
Subjective:    Patient ID: [name] [name], female    DOB: [birth year], 80 y.o.   MRN: [id number]  HPI Patient here today for continued cough and congestion. She also complains of excessive sweating as well and she thinks that some of her medications may be causing this. The patient has had persistent cough and congestion and has had 2 rounds of antibiotics. She still coughing and congested. She is known to me to have a long history of allergies and breathing problems. She is concerned that some of her medication could be causing her to have increased hot flashes and sweating. She is taking Celexa 20 m.         Patient Active Problem List   Diagnosis Date Noted  . hyperlipidemia   . Anxiety   . Depressive disorder, not elsewhere classified   . Rhinitis   . Elevated blood sugar   . Atrophic vaginitis   . Postmenopausal    Outpatient Encounter Prescriptions as of 09/19/2013  Medication Sig  . albuterol (PROVENTIL HFA;VENTOLIN HFA) 108 (90 BASE) MCG/ACT inhaler Inhale 2 puffs into the lungs every 6 (six) hours as needed for wheezing or shortness of breath.  . ALPRAZolam (XANAX) 0.25 MG tablet TAKE ONE TABLET BY MOUTH AT BEDTIME AS NEEDED  . amLODipine (NORVASC) 5 MG tablet TAKE ONE TABLET BY MOUTH ONE  TIME DAILY  . aspirin 81 MG EC tablet Take 81 mg by mouth daily.    . benzonatate (TESSALON) 200 MG capsule Take 1 capsule (200 mg total) by mouth 2 (two) times daily as needed for cough.  . Calcium Carbonate-Vitamin D (OSCAL 500/200 D-3 PO) Take 1 capsule by mouth 2 (two) times daily.    . Cholecalciferol (VITAMIN D) 2000 UNITS CAPS Take 1 capsule by mouth daily.    . citalopram (CELEXA) 20 MG tablet TAKE ONE TABLET BY MOUTH ONE TIME DAILY  . fluticasone (FLONASE) 50 MCG/ACT nasal spray 2 sprays by Nasal route daily.    Marland Kitchen lovastatin (MEVACOR) 40 MG tablet TAKE ONE TABLET BY MOUTH ONE TIME DAILY  . triamcinolone cream (KENALOG) 0.1 %   . [DISCONTINUED] doxycycline (VIBRA-TABS) 100 MG  tablet Take 1 tablet (100 mg total) by mouth 2 (two) times daily.  . [DISCONTINUED] doxycycline (VIBRA-TABS) 100 MG tablet Take 1 tablet (100 mg total) by mouth 2 (two) times daily.    Review of Systems  Constitutional: Negative.   HENT: Positive for congestion.   Eyes: Negative.   Respiratory: Positive for cough.   Cardiovascular: Negative.   Gastrointestinal: Negative.   Endocrine: Negative.        Increased sweating  Musculoskeletal: Negative.   Skin: Negative.   Allergic/Immunologic: Negative.   Neurological: Negative.   Hematological: Negative.   Psychiatric/Behavioral: Negative.        Objective:   Physical Exam  Nursing note and vitals reviewed. Constitutional: She is oriented to person, place, and time. She appears well-developed and well-nourished. No distress.  HENT:  Head: Normocephalic and atraumatic.  Right Ear: External ear normal.  Left Ear: External ear normal.  Nose: Nose normal.  Mouth/Throat: Oropharynx is clear and moist.  Eyes: Conjunctivae and EOM are normal. Pupils are equal, round, and reactive to light. Right eye exhibits no discharge. Left eye exhibits no discharge. No scleral icterus.  Neck: Normal range of motion. Neck supple. No thyromegaly present.  Cardiovascular: Normal rate, regular rhythm and normal heart sounds.   No murmur heard. Pulmonary/Chest: Effort normal. No respiratory distress. She has  wheezes. She has no rales. She exhibits no tenderness.  There are some sparse and scattered wheezes mostly in the left chest posteriorly  Abdominal: Soft. Bowel sounds are normal. She exhibits no mass. There is no tenderness. There is no rebound and no guarding.  Musculoskeletal: Normal range of motion. She exhibits no edema.  Lymphadenopathy:    She has no cervical adenopathy.  Neurological: She is alert and oriented to person, place, and time.  Skin: Skin is warm and dry. No rash noted.  Psychiatric: She has a normal mood and affect. Her behavior  is normal. Judgment and thought content normal.   BP 95/55  Pulse 74  Temp(Src) 98 F (36.7 C) (Oral)  Ht 5' 2" (1.575 m)  Wt 152 lb (68.947 kg)  BMI 27.79 kg/m2  [facility] reading (PRIMARY) by  Dr.Moore-chest x-ray  --no active disease                               Results for orders placed in visit on 09/19/13  POCT CBC      Result Value Ref Range   WBC 8.0  4.6 - 10.2 K/uL   Lymph, poc 2.1  0.6 - 3.4   POC LYMPH PERCENT 26.7  10 - 50 %L   POC Granulocyte 5.5  2 - 6.9   Granulocyte percent 68.4  37 - 80 %G   RBC 4.3  4.04 - 5.48 M/uL   Hemoglobin 12.9  12.2 - 16.2 g/dL   HCT, POC 40.3  37.7 - 47.9 %   MCV 93.2  80 - 97 fL   MCH, POC 29.9  27 - 31.2 pg   MCHC 32.1  31.8 - 35.4 g/dL   RDW, POC 13.8     Platelet Count, POC 261.0  142 - 424 K/uL   MPV 8.4  0 - 99.8 fL    The patient was aware of the lab results before she left the office     Assessment & Plan:  1. Cough - POCT CBC - DG Chest 2 View; Future - predniSONE (DELTASONE) 10 MG tablet; 1 tablet 4 times a day for 2 days,  1 tablet 3 times a day for 2 days,  1 tablet 2 times a day for 2 days, 1 tablet daily for 2 days  Dispense: 20 tablet; Refill: 0 - methylPREDNISolone acetate (DEPO-MEDROL) injection 60 mg; Inject 0.75 mLs (60 mg total) into the muscle once.  2. Excessive sweating - Thyroid Panel With TSH - BMP8+EGFR - FSH/LH - Prolactin  3. Bronchospasm - predniSONE (DELTASONE) 10 MG tablet; 1 tablet 4 times a day for 2 days,  1 tablet 3 times a day for 2 days,  1 tablet 2 times a day for 2 days, 1 tablet daily for 2 days  Dispense: 20 tablet; Refill: 0 - methylPREDNISolone acetate (DEPO-MEDROL) injection 60 mg; Inject 0.75 mLs (60 mg total) into the muscle once.  Patient Instructions  Continue to drink plenty of fluids Take medication as directed Use the Symbicort inhaler more regularly and continue to use the rescue inhaler as needed Continue Mucinex We will have the clinical pharmacists to review  your medication list to see if any of the other medications could be playing a role with your increased sweating   Don W. Moore MD   

## 2013-09-20 ENCOUNTER — Telehealth: Payer: Self-pay

## 2013-09-20 ENCOUNTER — Other Ambulatory Visit: Payer: Self-pay | Admitting: Family Medicine

## 2013-09-20 LAB — BMP8+EGFR
BUN / CREAT RATIO: 19 (ref 11–26)
BUN: 17 mg/dL (ref 8–27)
CO2: 26 mmol/L (ref 18–29)
CREATININE: 0.91 mg/dL (ref 0.57–1.00)
Calcium: 9.9 mg/dL (ref 8.7–10.3)
Chloride: 101 mmol/L (ref 97–108)
GFR calc non Af Amer: 60 mL/min/{1.73_m2} (ref >59)
GFR, EST AFRICAN AMERICAN: 69 mL/min/{1.73_m2} (ref >59)
Glucose: 102 mg/dL — ABNORMAL HIGH (ref 65–99)
Potassium: 4.7 mmol/L (ref 3.5–5.2)
Sodium: 140 mmol/L (ref 134–144)

## 2013-09-20 LAB — PROLACTIN: PROLACTIN: 14.9 ng/mL (ref 4.8–23.3)

## 2013-09-20 LAB — FSH/LH
FSH: 51 m[IU]/mL
LH: 25.6 m[IU]/mL

## 2013-09-20 LAB — THYROID PANEL WITH TSH
Free Thyroxine Index: 2 (ref 1.2–4.9)
T3 Uptake Ratio: 30 % (ref 24–39)
T4, Total: 6.6 ug/dL (ref 4.5–12.0)
TSH: 1.53 u[IU]/mL (ref 0.450–4.500)

## 2013-09-20 NOTE — Telephone Encounter (Signed)
Pt aware of CXR results.

## 2013-09-20 NOTE — Telephone Encounter (Signed)
Message copied by Koren Bound on Wed Sep 20, 2013  9:18 AM ------      Message from: Chipper Herb      Created: Tue Sep 19, 2013  5:25 PM       As per radiology report ------

## 2013-09-28 ENCOUNTER — Ambulatory Visit (INDEPENDENT_AMBULATORY_CARE_PROVIDER_SITE_OTHER): Payer: Medicare Other | Admitting: Family Medicine

## 2013-09-28 ENCOUNTER — Encounter: Payer: Self-pay | Admitting: Family Medicine

## 2013-09-28 VITALS — BP 132/68 | HR 60 | Temp 98.4°F | Ht 62.0 in | Wt 151.0 lb

## 2013-09-28 DIAGNOSIS — R21 Rash and other nonspecific skin eruption: Secondary | ICD-10-CM

## 2013-09-28 DIAGNOSIS — R05 Cough: Secondary | ICD-10-CM

## 2013-09-28 DIAGNOSIS — J4489 Other specified chronic obstructive pulmonary disease: Secondary | ICD-10-CM

## 2013-09-28 DIAGNOSIS — R0789 Other chest pain: Secondary | ICD-10-CM

## 2013-09-28 DIAGNOSIS — J449 Chronic obstructive pulmonary disease, unspecified: Secondary | ICD-10-CM

## 2013-09-28 DIAGNOSIS — R059 Cough, unspecified: Secondary | ICD-10-CM

## 2013-09-28 DIAGNOSIS — R071 Chest pain on breathing: Secondary | ICD-10-CM

## 2013-09-28 NOTE — Patient Instructions (Addendum)
Continue to drink plenty of fluids Use Symbicort regularly Rinse  mouth after using Use Flonase regularly If chest wall pain continues consider getting thoracic spine films

## 2013-09-28 NOTE — Progress Notes (Signed)
Subjective:    Patient ID: [name] [name], female    DOB: [birth year], 80 y.o.   MRN: [id number]  HPI Patient here today for 1 week follow up from cough and congestion. She is feeling better, but still having some head congestion. The patient is definitely feeling much better. She took all of the prednisone. She does have some pain on the left lower costal area. There are no GI symptoms associated with this. This pain is worse at nighttime. Is worse when she is sitting. The recent chest x-ray was reviewed and no abnormalities were noted except for her COPD.        Patient Active Problem List   Diagnosis Date Noted  . hyperlipidemia   . Anxiety   . Depressive disorder, not elsewhere classified   . Rhinitis   . Elevated blood sugar   . Atrophic vaginitis   . Postmenopausal    Outpatient Encounter Prescriptions as of 09/28/2013  Medication Sig  . albuterol (PROVENTIL HFA;VENTOLIN HFA) 108 (90 BASE) MCG/ACT inhaler Inhale 2 puffs into the lungs every 6 (six) hours as needed for wheezing or shortness of breath.  . ALPRAZolam (XANAX) 0.25 MG tablet TAKE ONE TABLET BY MOUTH AT BEDTIME AS NEEDED  . amLODipine (NORVASC) 5 MG tablet TAKE ONE TABLET BY MOUTH ONE  TIME DAILY  . aspirin 81 MG EC tablet Take 81 mg by mouth daily.    . Calcium Carbonate-Vitamin D (OSCAL 500/200 D-3 PO) Take 1 capsule by mouth 2 (two) times daily.    . Cholecalciferol (VITAMIN D) 2000 UNITS CAPS Take 1 capsule by mouth daily.    . citalopram (CELEXA) 20 MG tablet TAKE ONE TABLET BY MOUTH ONE TIME DAILY  . fluticasone (FLONASE) 50 MCG/ACT nasal spray USE ONE SPRAY EACH NOSTRIL ONE TIME DAILY  . lovastatin (MEVACOR) 40 MG tablet TAKE ONE TABLET BY MOUTH ONE TIME DAILY  . triamcinolone cream (KENALOG) 0.1 %   . [DISCONTINUED] benzonatate (TESSALON) 200 MG capsule Take 1 capsule (200 mg total) by mouth 2 (two) times daily as needed for cough.  . [DISCONTINUED] predniSONE (DELTASONE) 10 MG tablet 1 tablet 4 times a  day for 2 days,  1 tablet 3 times a day for 2 days,  1 tablet 2 times a day for 2 days, 1 tablet daily for 2 days    Review of Systems  Constitutional: Negative.   HENT: Positive for congestion (better).   Eyes: Negative.   Respiratory: Positive for cough (better).   Cardiovascular: Negative.   Gastrointestinal: Negative.   Endocrine: Negative.   Genitourinary: Negative.   Musculoskeletal: Negative.   Skin: Negative.   Allergic/Immunologic: Negative.   Neurological: Negative.   Hematological: Negative.   Psychiatric/Behavioral: Negative.        Objective:   Physical Exam  Nursing note and vitals reviewed. Constitutional: She is oriented to person, place, and time. She appears well-nourished. No distress.  Somewhat kyphotic but alert and pleasant  HENT:  Head: Normocephalic and atraumatic.  Right Ear: External ear normal.  Left Ear: External ear normal.  Mouth/Throat: Oropharynx is clear and moist.  Nasal congestion right side  Eyes: Conjunctivae and EOM are normal. Pupils are equal, round, and reactive to light. Right eye exhibits no discharge. Left eye exhibits no discharge. No scleral icterus.  Neck: Normal range of motion. Neck supple. No thyromegaly present.  Cardiovascular: Normal rate, regular rhythm and normal heart sounds.   No murmur heard. At 72 per minute  Pulmonary/Chest: Effort normal.  No respiratory distress. She has no wheezes. She has no rales. She exhibits no tenderness.  Slightly diminished breath sounds but no wheezing or rales.  Abdominal: Soft. Bowel sounds are normal. She exhibits no mass. There is no tenderness. There is no rebound and no guarding.  Musculoskeletal: She exhibits no edema.  Somewhat slow with mobility as far as getting on and off the table. There is no chest wall tenderness to palpation.  Lymphadenopathy:    She has no cervical adenopathy.  Neurological: She is alert and oriented to person, place, and time.  Skin: Skin is warm and  dry. No rash noted.  Psychiatric: She has a normal mood and affect. Her behavior is normal. Judgment and thought content normal.   BP 132/68  Pulse 60  Temp(Src) 98.4 F (36.9 C) (Oral)  Ht 5\' 2"  (1.575 m)  Wt 151 lb (68.493 kg)  BMI 27.61 kg/m2        Assessment & Plan:  1. Chest wall pain  2. Rash  3. Cough  4. COPD with chronic bronchitis  Patient Instructions  Continue to drink plenty of fluids Use Symbicort regularly Rinse  mouth after using Use Flonase regularly If chest wall pain continues consider getting thoracic spine ilms   Arrie Senate MD

## 2013-10-02 ENCOUNTER — Other Ambulatory Visit: Payer: Self-pay | Admitting: Family Medicine

## 2013-10-03 ENCOUNTER — Other Ambulatory Visit: Payer: Self-pay | Admitting: Family Medicine

## 2013-10-03 ENCOUNTER — Telehealth: Payer: Self-pay | Admitting: Family Medicine

## 2013-10-03 DIAGNOSIS — J449 Chronic obstructive pulmonary disease, unspecified: Secondary | ICD-10-CM

## 2013-10-04 NOTE — Telephone Encounter (Signed)
Pt last seen in office on 09-28-13. Rx last filled on 09-26-13 for #30. Please advise. If approved please route to pool A so nurse can phone in to pharmacy

## 2013-10-04 NOTE — Telephone Encounter (Signed)
Pt just finished multiple course of ATB Cannot call in another rx

## 2013-10-04 NOTE — Telephone Encounter (Signed)
To soon for refill was just filled 09/26/13

## 2013-10-05 NOTE — Addendum Note (Signed)
Addended by: Ilean China on: 10/05/2013 09:37 AM   Modules accepted: Orders

## 2013-10-05 NOTE — Telephone Encounter (Signed)
Left message on home voicemail that we would be unable to call in antibiotics but that if she is still sick we can schedule her to see a Islah Eve.

## 2013-10-05 NOTE — Telephone Encounter (Signed)
Patient continues to have cough, head congestion, and chest congestion. She has taken antibiotics and steroids recently as well as Mucinex but she is out of the Mucinex. Suggested she continue plain Mucinex and to increase her water intake to help thin secretions. Continue using inhalers as prescribed. Patient stated that a pulmonary referral had been discussed and she would like to proceed with this. I will place referral and patient is aware that it may be several days before she hears back about the appointment.

## 2013-10-09 ENCOUNTER — Telehealth: Payer: Self-pay | Admitting: Family Medicine

## 2013-10-09 NOTE — Telephone Encounter (Signed)
Has taking otc cough meds and nothing is helping wants to know if you can call her in something?

## 2013-10-26 ENCOUNTER — Encounter: Payer: Self-pay | Admitting: Pulmonary Disease

## 2013-10-26 ENCOUNTER — Ambulatory Visit (INDEPENDENT_AMBULATORY_CARE_PROVIDER_SITE_OTHER): Payer: Medicare Other | Admitting: Pulmonary Disease

## 2013-10-26 VITALS — BP 120/80 | HR 67 | Temp 97.7°F | Ht 62.0 in | Wt 155.0 lb

## 2013-10-26 DIAGNOSIS — R053 Chronic cough: Secondary | ICD-10-CM

## 2013-10-26 DIAGNOSIS — R059 Cough, unspecified: Secondary | ICD-10-CM

## 2013-10-26 DIAGNOSIS — R05 Cough: Secondary | ICD-10-CM | POA: Insufficient documentation

## 2013-10-26 MED ORDER — PREDNISONE 10 MG PO TABS: ORAL_TABLET | ORAL | Status: DC

## 2013-10-26 NOTE — Patient Instructions (Signed)
Stop symbicort, and will start on breo one inhalation each am.  Rinse mouth well after using. Will treat with a course of prednisone over 8 days Get chlorpheniramine 4mg  otc, and take 1-2 each night at bedtime until next visit.  Have the pharmacist help you find over the counter if you cannot locate. followup with me again in 2-3 weeks.

## 2013-10-26 NOTE — Progress Notes (Signed)
   Subjective:    Patient ID: [name], female    DOB: 1931/12/22, 79 y.o.   MRN: [id number]  HPI The patient is a 80 year old female who I've been asked to see for a chronic cough. The patient has not had issues in the past with her breathing or with cough, and approximately 3 weeks ago developed worsening cough over time. She denies having increased allergies, sinus infection, or any type of chest cold. She has minimal chest congestion with this, and produces mucus about 50% of the time. It was initially yellow and green, but now it is starting to clear. She has been treated with 2 rounds of antibiotics by her history with no change in her cough. She does have some tickle in her throat, but is unsure about postnasal drip. She denies any symptoms of sinusitis. She has no reflux symptoms or dyspepsia. She has never smoked during her lifetime, nor does she have any history of asthma. She is on Symbicort which she uses periodically, but does not feel that it has helped. She did have a chest x-ray in June of this year which showed hyperinflation but no acute process.   Review of Systems  Constitutional: Negative for fever and unexpected weight change.  HENT: Positive for congestion and sneezing. Negative for dental problem, ear pain, nosebleeds, postnasal drip, rhinorrhea, sinus pressure, sore throat and trouble swallowing.   Eyes: Negative for redness and itching.  Respiratory: Positive for cough and shortness of breath. Negative for chest tightness and wheezing.   Cardiovascular: Negative for palpitations and leg swelling.  Gastrointestinal: Negative for nausea and vomiting.  Genitourinary: Negative for dysuria.  Musculoskeletal: Negative for joint swelling.  Skin: Negative for rash.  Neurological: Negative for headaches.  Hematological: Does not bruise/bleed easily.  Psychiatric/Behavioral: Negative for dysphoric mood. The patient is not nervous/anxious.        Objective:   Physical  Exam Constitutional:  Well developed, no acute distress  HENT:  Nares patent without discharge  Oropharynx without exudate, palate and uvula are normal  Eyes:  Perrla, eomi, no scleral icterus  Neck:  No JVD, no TMG  Cardiovascular:  Normal rate, regular rhythm, no rubs or gallops.  No murmurs        Intact distal pulses  Pulmonary :  Normal breath sounds, no stridor or respiratory distress   No rales, rhonchi, or wheezing. Mild upper airway noise noted.  Abdominal:  Soft, nondistended, bowel sounds present.  No tenderness noted.   Musculoskeletal:  No lower extremity edema noted.  Lymph Nodes:  No cervical lymphadenopathy noted  Skin:  No cyanosis noted  Neurologic:  Alert, appropriate, moves all 4 extremities without obvious deficit.  +tremor noted.         Assessment & Plan:

## 2013-10-26 NOTE — Assessment & Plan Note (Signed)
The patient has mild airflow obstruction on her spirometry today, and also hyperinflation on her chest x-ray. It is unclear if this is related to senile emphysema, or whether she may have some underlying asthma. It is also unclear whether this has anything to do with her cough, and I'm still suspicious her cough may be primarily upper airway in origin. At this point, will try her on a different inhalers that she will be able to use easily with her tremor, will give her a short course of prednisone, and will treat for postnasal drip with chlorpheniramine. I would like to see her back in 2-3 weeks to check on her progress. We also need to keep in mind her risk of aspiration, and she does admit that she coughs with eating and drinking.

## 2013-11-08 ENCOUNTER — Ambulatory Visit (INDEPENDENT_AMBULATORY_CARE_PROVIDER_SITE_OTHER): Payer: Medicare Other | Admitting: Pharmacist

## 2013-11-08 ENCOUNTER — Ambulatory Visit (INDEPENDENT_AMBULATORY_CARE_PROVIDER_SITE_OTHER): Payer: Medicare Other

## 2013-11-08 ENCOUNTER — Encounter: Payer: Self-pay | Admitting: Pharmacist

## 2013-11-08 VITALS — BP 120/60 | HR 68 | Ht 62.0 in | Wt 154.0 lb

## 2013-11-08 DIAGNOSIS — Z13820 Encounter for screening for osteoporosis: Secondary | ICD-10-CM

## 2013-11-08 DIAGNOSIS — N951 Menopausal and female climacteric states: Secondary | ICD-10-CM

## 2013-11-08 DIAGNOSIS — Z0000 Encounter for general adult medical examination without abnormal findings: Secondary | ICD-10-CM

## 2013-11-08 LAB — HM DEXA SCAN

## 2013-11-08 MED ORDER — DULOXETINE HCL 20 MG PO CPEP: 20.0000 mg | ORAL_CAPSULE | Freq: Every day | ORAL | Status: DC

## 2013-11-08 NOTE — Patient Instructions (Addendum)
Health Maintenance Summary    INFLUENZA VACCINE Next Due Fall 2015  Last 02/01/2013   DEXA / Bone Density Next Due  11/09/2015 Last 11/08/2013   Pneumonia Vaccine Completed  Last 04/24/2013   Shingles / Zostavax Vaccine Completed  Last 03/13/2006   Mammogram Due Now  Last 07/04/2010    TETANUS/TDAP Next Due 04/13/2016  Last 04/13/2006    COLONOSCOPY Next Due 06/11/2016  Last 06/12/2006       Stop Citalopram Start Cymbalta / duloxetine 101m 1 capsules daily in the morning  Preventive Care for Adults A healthy lifestyle and preventive care can promote health and wellness. Preventive health guidelines for women include the following key practices.  A routine yearly physical is a good way to check with your health care provider about your health and preventive screening. It is a chance to share any concerns and updates on your health and to receive a thorough exam.  Visit your dentist for a routine exam and preventive care every 6 months. Brush your teeth twice a day and floss once a day. Good oral hygiene prevents tooth decay and gum disease.  The frequency of eye exams is based on your age, health, family medical history, use of contact lenses, and other factors. Follow your health care provider's recommendations for frequency of eye exams.  Eat a healthy diet. Foods like vegetables, fruits, whole grains, low-fat dairy products, and lean protein foods contain the nutrients you need without too many calories. Decrease your intake of foods high in solid fats, added sugars, and salt. Eat the right amount of calories for you.Get information about a proper diet from your health care provider, if necessary.  Regular physical exercise is one of the most important things you can do for your health. Most adults should get at least 150 minutes of moderate-intensity exercise (any activity that increases your heart rate and causes you to sweat) each week. In addition, most adults need muscle-strengthening  exercises on 2 or more days a week.  Maintain a healthy weight. The body mass index (BMI) is a screening tool to identify possible weight problems. It provides an estimate of body fat based on height and weight. Your health care provider can find your BMI and can help you achieve or maintain a healthy weight.For adults 20 years and older:  A BMI below 18.5 is considered underweight.  A BMI of 18.5 to 24.9 is normal.  A BMI of 25 to 29.9 is considered overweight.  A BMI of 30 and above is considered obese.  Maintain normal blood lipids and cholesterol levels by exercising and minimizing your intake of saturated fat. Eat a balanced diet with plenty of fruit and vegetables. Blood tests for lipids and cholesterol should begin at age 1769and be repeated every 5 years. If your lipid or cholesterol levels are high, you are over 50, or you are at high risk for heart disease, you may need your cholesterol levels checked more frequently.Ongoing high lipid and cholesterol levels should be treated with medicines if diet and exercise are not working.  If you smoke, find out from your health care provider how to quit. If you do not use tobacco, do not start.  Lung cancer screening is recommended for adults aged 527-80years who are at high risk for developing lung cancer because of a history of smoking. A yearly low-dose CT scan of the lungs is recommended for people who have at least a 30-pack-year history of smoking and are a current smoker  or have quit within the past 15 years. A pack year of smoking is smoking an average of 1 pack of cigarettes a day for 1 year (for example: 1 pack a day for 30 years or 2 packs a day for 15 years). Yearly screening should continue until the smoker has stopped smoking for at least 15 years. Yearly screening should be stopped for people who develop a health problem that would prevent them from having lung cancer treatment.  If you are pregnant, do not drink alcohol. If you are  breastfeeding, be very cautious about drinking alcohol. If you are not pregnant and choose to drink alcohol, do not have more than 1 drink per day. One drink is considered to be 12 ounces (355 mL) of beer, 5 ounces (148 mL) of wine, or 1.5 ounces (44 mL) of liquor.  Avoid use of street drugs. Do not share needles with anyone. Ask for help if you need support or instructions about stopping the use of drugs.  High blood pressure causes heart disease and increases the risk of stroke. Your blood pressure should be checked at least every 1 to 2 years. Ongoing high blood pressure should be treated with medicines if weight loss and exercise do not work.  If you are 23-81 years old, ask your health care provider if you should take aspirin to prevent strokes.  Diabetes screening involves taking a blood sample to check your fasting blood sugar level. This should be done once every 3 years, after age 16, if you are within normal weight and without risk factors for diabetes. Testing should be considered at a younger age or be carried out more frequently if you are overweight and have at least 1 risk factor for diabetes.  Breast cancer screening is essential preventive care for women. You should practice "breast self-awareness." This means understanding the normal appearance and feel of your breasts and may include breast self-examination. Any changes detected, no matter how small, should be reported to a health care provider. Women in their 24s and 30s should have a clinical breast exam (CBE) by a health care provider as part of a regular health exam every 1 to 3 years. After age 70, women should have a CBE every year. Starting at age 69, women should consider having a mammogram (breast X-ray test) every year. Women who have a family history of breast cancer should talk to their health care provider about genetic screening. Women at a high risk of breast cancer should talk to their health care providers about having  an MRI and a mammogram every year.  Breast cancer gene (BRCA)-related cancer risk assessment is recommended for women who have family members with BRCA-related cancers. BRCA-related cancers include breast, ovarian, tubal, and peritoneal cancers. Having family members with these cancers may be associated with an increased risk for harmful changes (mutations) in the breast cancer genes BRCA1 and BRCA2. Results of the assessment will determine the need for genetic counseling and BRCA1 and BRCA2 testing.  Routine pelvic exams to screen for cancer are no longer recommended for nonpregnant women who are considered low risk for cancer of the pelvic organs (ovaries, uterus, and vagina) and who do not have symptoms. Ask your health care provider if a screening pelvic exam is right for you.  If you have had past treatment for cervical cancer or a condition that could lead to cancer, you need Pap tests and screening for cancer for at least 20 years after your treatment. If Pap tests  have been discontinued, your risk factors (such as having a new sexual partner) need to be reassessed to determine if screening should be resumed. Some women have medical problems that increase the chance of getting cervical cancer. In these cases, your health care provider may recommend more frequent screening and Pap tests.  The HPV test is an additional test that may be used for cervical cancer screening. The HPV test looks for the virus that can cause the cell changes on the cervix. The cells collected during the Pap test can be tested for HPV. The HPV test could be used to screen women aged 9 years and older, and should be used in women of any age who have unclear Pap test results. After the age of 22, women should have HPV testing at the same frequency as a Pap test.  Colorectal cancer can be detected and often prevented. Most routine colorectal cancer screening begins at the age of 67 years and continues through age 45 years.  However, your health care provider may recommend screening at an earlier age if you have risk factors for colon cancer. On a yearly basis, your health care provider may provide home test kits to check for hidden blood in the stool. Use of a small camera at the end of a tube, to directly examine the colon (sigmoidoscopy or colonoscopy), can detect the earliest forms of colorectal cancer. Talk to your health care provider about this at age 79, when routine screening begins. Direct exam of the colon should be repeated every 5-10 years through age 89 years, unless early forms of pre-cancerous polyps or small growths are found.  People who are at an increased risk for hepatitis B should be screened for this virus. You are considered at high risk for hepatitis B if:  You were born in a country where hepatitis B occurs often. Talk with your health care provider about which countries are considered high risk.  Your parents were born in a high-risk country and you have not received a shot to protect against hepatitis B (hepatitis B vaccine).  You have HIV or AIDS.  You use needles to inject street drugs.  You live with, or have sex with, someone who has hepatitis B.  You get hemodialysis treatment.  You take certain medicines for conditions like cancer, organ transplantation, and autoimmune conditions.  Hepatitis C blood testing is recommended for all people born from [birth year] through 1963 and any individual with known risks for hepatitis C.  Practice safe sex. Use condoms and avoid high-risk sexual practices to reduce the spread of sexually transmitted infections (STIs). STIs include gonorrhea, chlamydia, syphilis, trichomonas, herpes, HPV, and human immunodeficiency virus (HIV). Herpes, HIV, and HPV are viral illnesses that have no cure. They can result in disability, cancer, and death.  You should be screened for sexually transmitted illnesses (STIs) including gonorrhea and chlamydia if:  You are  sexually active and are younger than 24 years.  You are older than 24 years and your health care provider tells you that you are at risk for this type of infection.  Your sexual activity has changed since you were last screened and you are at an increased risk for chlamydia or gonorrhea. Ask your health care provider if you are at risk.  If you are at risk of being infected with HIV, it is recommended that you take a prescription medicine daily to prevent HIV infection. This is called preexposure prophylaxis (PrEP). You are considered at risk if:  You  are a heterosexual woman, are sexually active, and are at increased risk for HIV infection.  You take drugs by injection.  You are sexually active with a partner who has HIV.  Talk with your health care provider about whether you are at high risk of being infected with HIV. If you choose to begin PrEP, you should first be tested for HIV. You should then be tested every 3 months for as long as you are taking PrEP.  Osteoporosis is a disease in which the bones lose minerals and strength with aging. This can result in serious bone fractures or breaks. The risk of osteoporosis can be identified using a bone density scan. Women ages 24 years and over and women at risk for fractures or osteoporosis should discuss screening with their health care providers. Ask your health care provider whether you should take a calcium supplement or vitamin D to reduce the rate of osteoporosis.  Menopause can be associated with physical symptoms and risks. Hormone replacement therapy is available to decrease symptoms and risks. You should talk to your health care provider about whether hormone replacement therapy is right for you.  Use sunscreen. Apply sunscreen liberally and repeatedly throughout the day. You should seek shade when your shadow is shorter than you. Protect yourself by wearing long sleeves, pants, a wide-brimmed hat, and sunglasses year round, whenever you  are outdoors.  Once a month, do a whole body skin exam, using a mirror to look at the skin on your back. Tell your health care provider of new moles, moles that have irregular borders, moles that are larger than a pencil eraser, or moles that have changed in shape or color.  Stay current with required vaccines (immunizations).  Influenza vaccine. All adults should be immunized every year.  Tetanus, diphtheria, and acellular pertussis (Td, Tdap) vaccine. Pregnant women should receive 1 dose of Tdap vaccine during each pregnancy. The dose should be obtained regardless of the length of time since the last dose. Immunization is preferred during the 27th-36th week of gestation. An adult who has not previously received Tdap or who does not know her vaccine status should receive 1 dose of Tdap. This initial dose should be followed by tetanus and diphtheria toxoids (Td) booster doses every 10 years. Adults with an unknown or incomplete history of completing a 3-dose immunization series with Td-containing vaccines should begin or complete a primary immunization series including a Tdap dose. Adults should receive a Td booster every 10 years.  Varicella vaccine. An adult without evidence of immunity to varicella should receive 2 doses or a second dose if she has previously received 1 dose. Pregnant females who do not have evidence of immunity should receive the first dose after pregnancy. This first dose should be obtained before leaving the health care facility. The second dose should be obtained 4-8 weeks after the first dose.  Human papillomavirus (HPV) vaccine. Females aged 13-26 years who have not received the vaccine previously should obtain the 3-dose series. The vaccine is not recommended for use in pregnant females. However, pregnancy testing is not needed before receiving a dose. If a female is found to be pregnant after receiving a dose, no treatment is needed. In that case, the remaining doses should be  delayed until after the pregnancy. Immunization is recommended for any person with an immunocompromised condition through the age of 51 years if she did not get any or all doses earlier. During the 3-dose series, the second dose should be obtained 4-8  weeks after the first dose. The third dose should be obtained 24 weeks after the first dose and 16 weeks after the second dose.  Zoster vaccine. One dose is recommended for adults aged 66 years or older unless certain conditions are present.  Measles, mumps, and rubella (MMR) vaccine. Adults born before [birth year] generally are considered immune to measles and mumps. Adults born in [birth year] or later should have 1 or more doses of MMR vaccine unless there is a contraindication to the vaccine or there is laboratory evidence of immunity to each of the three diseases. A routine second dose of MMR vaccine should be obtained at least 28 days after the first dose for students attending postsecondary schools, health care workers, or international travelers. People who received inactivated measles vaccine or an unknown type of measles vaccine during 1963-1967 should receive 2 doses of MMR vaccine. People who received inactivated mumps vaccine or an unknown type of mumps vaccine before 1979 and are at high risk for mumps infection should consider immunization with 2 doses of MMR vaccine. For females of childbearing age, rubella immunity should be determined. If there is no evidence of immunity, females who are not pregnant should be vaccinated. If there is no evidence of immunity, females who are pregnant should delay immunization until after pregnancy. Unvaccinated health care workers born before [birth year] who lack laboratory evidence of measles, mumps, or rubella immunity or laboratory confirmation of disease should consider measles and mumps immunization with 2 doses of MMR vaccine or rubella immunization with 1 dose of MMR vaccine.  Pneumococcal 13-valent conjugate (PCV13) vaccine.  When indicated, a person who is uncertain of her immunization history and has no record of immunization should receive the PCV13 vaccine. An adult aged 66 years or older who has certain medical conditions and has not been previously immunized should receive 1 dose of PCV13 vaccine. This PCV13 should be followed with a dose of pneumococcal polysaccharide (PPSV23) vaccine. The PPSV23 vaccine dose should be obtained at least 8 weeks after the dose of PCV13 vaccine. An adult aged 19 years or older who has certain medical conditions and previously received 1 or more doses of PPSV23 vaccine should receive 1 dose of PCV13. The PCV13 vaccine dose should be obtained 1 or more years after the last PPSV23 vaccine dose.  Pneumococcal polysaccharide (PPSV23) vaccine. When PCV13 is also indicated, PCV13 should be obtained first. All adults aged 84 years and older should be immunized. An adult younger than age 37 years who has certain medical conditions should be immunized. Any person who resides in a nursing home or long-term care facility should be immunized. An adult smoker should be immunized. People with an immunocompromised condition and certain other conditions should receive both PCV13 and PPSV23 vaccines. People with human immunodeficiency virus (HIV) infection should be immunized as soon as possible after diagnosis. Immunization during chemotherapy or radiation therapy should be avoided. Routine use of PPSV23 vaccine is not recommended for American Indians, [place] Natives, or people younger than 65 years unless there are medical conditions that require PPSV23 vaccine. When indicated, people who have unknown immunization and have no record of immunization should receive PPSV23 vaccine. One-time revaccination 5 years after the first dose of PPSV23 is recommended for people aged 19-64 years who have chronic kidney failure, nephrotic syndrome, asplenia, or immunocompromised conditions. People who received 1-2 doses of  PPSV23 before age 41 years should receive another dose of PPSV23 vaccine at age 27 years or later if at least 5 years  have passed since the previous dose. Doses of PPSV23 are not needed for people immunized with PPSV23 at or after age 12 years.  Meningococcal vaccine. Adults with asplenia or persistent complement component deficiencies should receive 2 doses of quadrivalent meningococcal conjugate (MenACWY-D) vaccine. The doses should be obtained at least 2 months apart. Microbiologists working with certain meningococcal bacteria, [place] recruits, people at risk during an outbreak, and people who travel to or live in countries with a high rate of meningitis should be immunized. A first-year college student up through age 71 years who is living in a residence hall should receive a dose if she did not receive a dose on or after her 16th birthday. Adults who have certain high-risk conditions should receive one or more doses of vaccine.  Hepatitis A vaccine. Adults who wish to be protected from this disease, have certain high-risk conditions, work with hepatitis A-infected animals, work in hepatitis A research labs, or travel to or work in countries with a high rate of hepatitis A should be immunized. Adults who were previously unvaccinated and who anticipate close contact with an international adoptee during the first 60 days after arrival in the [place] from a country with a high rate of hepatitis A should be immunized.  Hepatitis B vaccine. Adults who wish to be protected from this disease, have certain high-risk conditions, may be exposed to blood or other infectious body fluids, are household contacts or sex partners of hepatitis B positive people, are clients or workers in certain care facilities, or travel to or work in countries with a high rate of hepatitis B should be immunized.    Fall Prevention and Home Safety Falls cause injuries and can affect all age groups. It is possible to use  preventive measures to significantly decrease the likelihood of falls. There are many simple measures which can make your home safer and prevent falls. OUTDOORS  Repair cracks and edges of walkways and driveways.  Remove high doorway thresholds.  Trim shrubbery on the main path into your home.  Have good outside lighting.  Clear walkways of tools, rocks, debris, and clutter.  Check that handrails are not broken and are securely fastened. Both sides of steps should have handrails.  Have leaves, snow, and ice cleared regularly.  Use sand or salt on walkways during winter months.  In the garage, clean up grease or oil spills. BATHROOM  Install night lights.  Install grab bars by the toilet and in the tub and shower.  Use non-skid mats or decals in the tub or shower.  Place a plastic non-slip stool in the shower to sit on, if needed.  Keep floors dry and clean up all water on the floor immediately.  Remove soap buildup in the tub or shower on a regular basis.  Secure bath mats with non-slip, double-sided rug tape.  Remove throw rugs and tripping hazards from the floors. BEDROOMS  Install night lights.  Make sure a bedside light is easy to reach.  Do not use oversized bedding.  Keep a telephone by your bedside.  Have a firm chair with side arms to use for getting dressed.  Remove throw rugs and tripping hazards from the floor. KITCHEN  Keep handles on pots and pans turned toward the center of the stove. Use back burners when possible.  Clean up spills quickly and allow time for drying.  Avoid walking on wet floors.  Avoid hot utensils and knives.  Position shelves so they are not too high  or low.  Place commonly used objects within easy reach.  If necessary, use a sturdy step stool with a grab bar when reaching.  Keep electrical cables out of the way.  Do not use floor polish or wax that makes floors slippery. If you must use wax, use non-skid floor  wax.  Remove throw rugs and tripping hazards from the floor. STAIRWAYS  Never leave objects on stairs.  Place handrails on both sides of stairways and use them. Fix any loose handrails. Make sure handrails on both sides of the stairways are as long as the stairs.  Check carpeting to make sure it is firmly attached along stairs. Make repairs to worn or loose carpet promptly.  Avoid placing throw rugs at the top or bottom of stairways, or properly secure the rug with carpet tape to prevent slippage. Get rid of throw rugs, if possible.  Have an electrician put in a light switch at the top and bottom of the stairs. OTHER FALL PREVENTION TIPS  Wear low-heel or rubber-soled shoes that are supportive and fit well. Wear closed toe shoes.  When using a stepladder, make sure it is fully opened and both spreaders are firmly locked. Do not climb a closed stepladder.  Add color or contrast paint or tape to grab bars and handrails in your home. Place contrasting color strips on first and last steps.  Learn and use mobility aids as needed. Install an electrical emergency response system.  Turn on lights to avoid dark areas. Replace light bulbs that burn out immediately. Get light switches that glow.  Arrange furniture to create clear pathways. Keep furniture in the same place.  Firmly attach carpet with non-skid or double-sided tape.  Eliminate uneven floor surfaces.  Select a carpet pattern that does not visually hide the edge of steps.  Be aware of all pets. OTHER HOME SAFETY TIPS  Set the water temperature for 120 F (48.8 C).  Keep emergency numbers on or near the telephone.  Keep smoke detectors on every level of the home and near sleeping areas. Document Released: 03/20/2002 Document Revised: 09/29/2011 Document Reviewed: 06/19/2011 [facility] Patient Information 2015 [place], [place]. This information is not intended to replace advice given to you by your health care provider. Make  sure you discuss any questions you have with your health care provider.

## 2013-11-08 NOTE — Progress Notes (Signed)
Patient ID: [name] [name], female   DOB: [birth year], 80 y.o.   MRN: [id number] Subjective:    [name] [name] is a 81 y.o. female who presents for Medicare Initial Wellness Visit and to discuss DEXA results. Patient also c/o excessive sweating - even in the air conditioning.  She is wondering if it is related to a medication she is taking  Preventive Screening-Counseling & Management  Tobacco History  Smoking status  . Never Smoker   Smokeless tobacco  . Never Used     Current Problems (verified) Patient Active Problem List   Diagnosis Date Noted  . Chronic cough 10/26/2013  . hyperlipidemia   . Anxiety   . Depressive disorder, not elsewhere classified   . Rhinitis   . Elevated blood sugar   . Atrophic vaginitis   . Postmenopausal     Medications Prior to Visit Current Outpatient Prescriptions on File Prior to Visit  Medication Sig Dispense Refill  . ALPRAZolam (XANAX) 0.25 MG tablet TAKE ONE TABLET BY MOUTH AT BEDTIME AS NEEDED  30 tablet  1  . aspirin 81 MG EC tablet Take 81 mg by mouth daily.        . Calcium Carbonate-Vitamin D (OSCAL 500/200 D-3 PO) Take 1 capsule by mouth 2 (two) times daily.        . Cholecalciferol (VITAMIN D) 2000 UNITS CAPS Take 1 capsule by mouth daily.        . fluticasone (FLONASE) 50 MCG/ACT nasal spray USE ONE SPRAY EACH NOSTRIL ONE TIME DAILY  16 g  5  . lovastatin (MEVACOR) 40 MG tablet TAKE ONE half TABLET BY MOUTH ONE TIME DAILY      . Omega-3 Fatty Acids (FISH OIL) 1200 MG CAPS Take 2 capsules by mouth daily.      Marland Kitchen triamcinolone cream (KENALOG) 0.1 %        No current facility-administered medications on file prior to visit.    Current Medications (verified) Current Outpatient Prescriptions  Medication Sig Dispense Refill  . ALPRAZolam (XANAX) 0.25 MG tablet TAKE ONE TABLET BY MOUTH AT BEDTIME AS NEEDED  30 tablet  1  . amLODipine (NORVASC) 5 MG tablet TAKE ONE-HALF TABLET BY MOUTH ONE  TIME DAILY      . aspirin 81 MG EC tablet  Take 81 mg by mouth daily.        . Calcium Carbonate-Vitamin D (OSCAL 500/200 D-3 PO) Take 1 capsule by mouth 2 (two) times daily.        . Cholecalciferol (VITAMIN D) 2000 UNITS CAPS Take 1 capsule by mouth daily.        . fluticasone (FLONASE) 50 MCG/ACT nasal spray USE ONE SPRAY EACH NOSTRIL ONE TIME DAILY  16 g  5  . Fluticasone Furoate-Vilanterol (BREO ELLIPTA) 100-25 MCG/INH AEPB Inhale 1 puff into the lungs daily.      Marland Kitchen lovastatin (MEVACOR) 40 MG tablet TAKE ONE half TABLET BY MOUTH ONE TIME DAILY      . Omega-3 Fatty Acids (FISH OIL) 1200 MG CAPS Take 2 capsules by mouth daily.      Marland Kitchen triamcinolone cream (KENALOG) 0.1 %       . DULoxetine (CYMBALTA) 20 MG capsule Take 1 capsule (20 mg total) by mouth daily.  30 capsule  0   No current facility-administered medications for this visit.     Allergies (verified) Cephalexin; Chlordiazepoxide-clidinium; Clarithromycin; Erymax; Evista; Penicillins; Sertraline hcl; Sulfa antibiotics; Betadine; and Hydrogen peroxide   PAST HISTORY  Family  History Family History  Problem Relation Age of Onset  . Arthritis Mother   . COPD Father   . Diabetes Father   . Aneurysm Sister     Social History History  Substance Use Topics  . Smoking status: Never Smoker   . Smokeless tobacco: Never Used  . Alcohol Use: No     Are there smokers in your home (other than you)? No  Risk Factors Current exercise habits: The patient does not participate in regular exercise at present.  Dietary issues discussed: limiting high fat foods   Cardiac risk factors: advanced age (older than 61 for men, 66 for women), dyslipidemia, hypertension and sedentary lifestyle.  Depression Screen (Note: if answer to either of the following is "Yes", a more complete depression screening is indicated)   Over the past 2 weeks, have you felt down, depressed or hopeless? Yes  Over the past 2 weeks, have you felt little interest or pleasure in doing things? No  Have you  lost interest or pleasure in daily life? No  Do you often feel hopeless? No  Do you cry easily over simple problems? No  Activities of Daily Living In your present state of health, do you have any difficulty performing the following activities?:  Driving? No - patient had never driven Managing money?  No Feeding yourself? No Getting from bed to chair? No  Climbing a flight of stairs? No Preparing food and eating?: No Bathing or showering? No Getting dressed: No Getting to the toilet? No Using the toilet:No Moving around from place to place: No In the past year have you fallen or had a near fall?:Yes   Are you sexually active?  No  Do you have more than one partner?  No  Hearing Difficulties: Yes Do you often ask people to speak up or repeat themselves? No Do you experience ringing or noises in your ears? No Do you have difficulty understanding soft or whispered voices? Yes   Do you feel that you have a problem with memory? No  Do you often misplace items? No  Do you feel safe at home?  Yes  Cognitive Testing  Alert? Yes  Normal Appearance?Yes  Oriented to person? Yes  Place? Yes   Time? Yes  Recall of three objects?  Yes  Can perform simple calculations? Yes  Displays appropriate judgment?Yes  Can read the correct time from a watch face?Yes   Advanced Directives have been discussed with the patient? Yes  List the Names of Other Physician/Practitioners you currently use: 1.  Danton Sewer, MD - pulmonologist 2.  Clent Jacks, MD - opthalmologist 3.  [facility]uda - ortho 4.  Allyson Sabal - dermatologist  Indicate any recent Medical Services you may have received from other than [facility] providers in the past year (date may be approximate).  Immunization History  Administered Date(s) Administered  . Influenza Whole 12/12/2009  . Influenza,inj,Quad PF,36+ Mos 02/01/2013  . Pneumococcal Conjugate-13 04/24/2013  . Pneumococcal Polysaccharide-23 01/12/2003  . Td 04/13/2006  . Zoster  03/13/2006    Screening Tests Health Maintenance  Topic Date Due  . Influenza Vaccine  11/11/2013  . Tetanus/tdap  04/13/2016  . Colonoscopy  06/11/2016  . Pneumococcal Polysaccharide Vaccine Age 54 And Over  Completed  . Zostavax  Completed   Last eye xcam 06/13/2013 for evaluation of eye changes Last comprehensive eye exam was 11/08/2012 Confirmed above with Dr Zenia Resides office - they are sending records.  All answers were reviewed with the patient and necessary referrals  were made:  Cherre Robins, [facility]   11/08/2013   History reviewed: allergies, current medications, past family history, past medical history, past social history, past surgical history and problem list  HPI: Patient has a history of osteopenia though recent years her BMD has been in the normal range.  She is currently taking both Calcium and Vitamin D supplementation on a regular basis.  She ias tried Evista in past but was unable to tolerate - caused "numb feeling"  Back Pain?  Yes       Kyphosis?  Yes Prior fracture?  No                                                             PMH: Age at menopause:  70's Hysterectomy?  No Oophorectomy?  No HRT? Yes - Former.  Type/duration: for 10 years Steroid Use?  Yes - Former.  Type/duration: just finished 8 days of treatement for pulmonary issue Thyroid med?  No History of cancer?  No History of digestive disorders (ie Crohn's)?  No Current or previous eating disorders?  No          Objective:      Body mass index is 28.16 kg/(m^2). BP 120/60  Pulse 68  Ht 5\' 2"  (1.575 m)  Wt 154 lb (69.854 kg)  BMI 28.16 kg/m2  Last Vitamin D Result:  57.9 (08/15/2013) Last GFR Result:  60 (09/19/2013) Thyroid panel = WNL (09/19/2013)   DEXA Results Date of Test T-Score for AP Spine L1-L4 T-Score for Total Left Hip T-Score for Total Right Hip  11/08/2013 0.6 0.0 -0.5  11/04/2011 0.5 0.1 -0.3  06/26/2009 0.8 -0.1 -0.2       T-Score at neck of right hip /  femur was -1.1 (06/26/2009)   Assessment:   Medicare Annual Wellness Visit Normal BMD Excessive swerating - possible side effect to citalopram   Plan:     During the course of the visit the patient was educated and counseled about appropriate screening and preventive services including:    Pneumococcal vaccine   Influenza vaccine  Hepatitis B vaccine  Td vaccine  Screening mammography - patient advised to schedule mammogram, she declined to schedule today  Screening Pap smear and pelvic exam   Bone densitometry screening  Colorectal cancer screening  Diabetes screening  Glaucoma screening  Advanced directives: [facility] packet given to patient  discontinue citalopram, start cymbalta 20mg  1 capsules daily  Results of DEXA discussed with patient   continue calcium 1200mg  daily through supplementation or diet.   recommend weight bearing exercise - 30 minutes at least 4 days per week.     Counseled and educated about fall risk and prevention.  Recheck DEXA:  2 to 5 years  Patient Instructions (the written plan) was given to the patient.  Medicare Attestation I have personally reviewed: The patient's medical and social history Their use of alcohol, tobacco or illicit drugs Their current medications and supplements The patient's functional ability including ADLs,fall risks, home safety risks, cognitive, and hearing and visual impairment Diet and physical activities Evidence for depression or mood disorders  The patient's weight, height, BMI, and BP/HR have been recorded in the chart.  I have made referrals, counseling, and provided education to the patient based on review of the above and  I have provided the patient with a written personalized care plan for preventive services.     Cherre Robins, [facility]   11/08/2013

## 2013-11-09 ENCOUNTER — Telehealth: Payer: Self-pay | Admitting: Pharmacist

## 2013-11-09 MED ORDER — BUSPIRONE HCL 7.5 MG PO TABS: ORAL_TABLET | ORAL | Status: DC

## 2013-11-09 NOTE — Telephone Encounter (Signed)
Had called in duloxetine / cymbalta for patient to try in place of citalopram but it apprears that duloxetine is too expensive. She was c/o sweating that could have been from SSRI. Will try an alternative - buspar 7.5mg  daily

## 2013-11-09 NOTE — Telephone Encounter (Signed)
No I had initially mentioned trying sertraline but saw that she had difficulty tolerating in past - i sent in Rx for buspar instead.

## 2013-11-17 ENCOUNTER — Ambulatory Visit (INDEPENDENT_AMBULATORY_CARE_PROVIDER_SITE_OTHER): Payer: Medicare Other | Admitting: Family Medicine

## 2013-11-17 VITALS — BP 164/70 | HR 88 | Temp 97.3°F | Ht 62.0 in | Wt 156.8 lb

## 2013-11-17 DIAGNOSIS — I872 Venous insufficiency (chronic) (peripheral): Secondary | ICD-10-CM

## 2013-11-17 DIAGNOSIS — I8310 Varicose veins of unspecified lower extremity with inflammation: Secondary | ICD-10-CM

## 2013-11-17 NOTE — Progress Notes (Signed)
   Subjective:    Patient ID: [name] [name], female    DOB: 1931/06/21, 80 y.o.   MRN: [id number]  HPI This 80 y.o. female presents for evaluation of lower extremity edema and rash.   Review of Systems C/o lower extremity rash   No chest pain, SOB, HA, dizziness, vision change, N/V, diarrhea, constipation, dysuria, urinary urgency or frequency, myalgias, arthralgias.  Objective:   Physical Exam  Vital signs noted  Well developed well nourished female.  HEENT - Head atraumatic Normocephalic Respiratory - Lungs CTA bilateral Cardiac - RRR S1 and S2 without murmur Legs - pretibial and pedal edema and petechial hemorrhages on bilateral legs      Assessment & Plan:  Venous stasis dermatitis of both lower extremities Venous compression stockings and apply the ketocanzole cream to her legs  Lysbeth Penner FNP

## 2013-11-20 ENCOUNTER — Encounter: Payer: Self-pay | Admitting: Pulmonary Disease

## 2013-11-20 ENCOUNTER — Ambulatory Visit (INDEPENDENT_AMBULATORY_CARE_PROVIDER_SITE_OTHER): Payer: Medicare Other | Admitting: Pulmonary Disease

## 2013-11-20 ENCOUNTER — Ambulatory Visit (INDEPENDENT_AMBULATORY_CARE_PROVIDER_SITE_OTHER)
Admission: RE | Admit: 2013-11-20 | Discharge: 2013-11-20 | Disposition: A | Discharge status: Home / Self Care | Payer: Medicare Other | Source: Ambulatory Visit | Attending: Pulmonary Disease | Admitting: Pulmonary Disease

## 2013-11-20 VITALS — BP 126/80 | HR 69 | Temp 97.8°F | Ht 62.0 in | Wt 154.8 lb

## 2013-11-20 DIAGNOSIS — R05 Cough: Secondary | ICD-10-CM

## 2013-11-20 DIAGNOSIS — R053 Chronic cough: Secondary | ICD-10-CM

## 2013-11-20 DIAGNOSIS — R059 Cough, unspecified: Secondary | ICD-10-CM

## 2013-11-20 NOTE — Assessment & Plan Note (Signed)
The patient's cough has significantly improved, but she continues to have sinus pressure, purulent nasal drainage, as well as postnasal drip despite a good nasal hygiene regimen. She has been tried on numerous courses of antibiotics with no change. I think she needs to have a limited CT sinus given her ongoing symptoms and persistent cough. If this is unremarkable, I would consider whether to do a CT chest given her hyperinflation and squeaks on exam that may be indicative of bronchiectasis. In the meantime, I have asked her to continue on breo.

## 2013-11-20 NOTE — Progress Notes (Signed)
   Subjective:    Patient ID: [name] [name], female    DOB: Mar 25, 1932, 80 y.o.   MRN: [id number]  HPI Patient comes in today for followup of her chronic cough. At the last visit, she was found to have mild airflow obstruction on spirometry, as well as hyperinflation on her chest x-ray. She was started on breo, and has seen a lot less shaking on this medication. We also work on a more aggressive nasal hygiene regimen with the addition of chlorpheniramine and her nasal steroids. The patient states that her cough is at least 30% better or more. However, she is having ongoing sinus pressure on the right side of her face, and still blowing purulent mucus from her nose. She is also having ongoing postnasal drip. It is unclear whether the Memory Dance has made a difference to her breathing.   Review of Systems  Constitutional: Negative for fever and unexpected weight change.  HENT: Positive for congestion and sinus pressure. Negative for dental problem, ear pain, nosebleeds, postnasal drip, rhinorrhea, sneezing, sore throat and trouble swallowing.   Eyes: Negative for redness and itching.  Respiratory: Positive for cough. Negative for chest tightness, shortness of breath and wheezing.   Cardiovascular: Negative for palpitations and leg swelling.  Gastrointestinal: Negative for nausea and vomiting.  Genitourinary: Negative for dysuria.  Musculoskeletal: Negative for joint swelling.  Skin: Negative for rash.  Neurological: Negative for headaches.  Hematological: Does not bruise/bleed easily.  Psychiatric/Behavioral: Negative for dysphoric mood. The patient is not nervous/anxious.        Objective:   Physical Exam Well-developed female in no acute distress Nose without purulence or discharge noted Neck without lymphadenopathy or thyromegaly Chest with good breath sounds bilaterally, but a few inspiratory squeaks and pops scattered throughout, no true wheezing Cardiac exam with regular rate and  rhythm Lower extremities with mild edema, mild erythematous rash on her lower legs near the ankle which is being evaluated by her primary physician. Alert and oriented, moves all 4 extremities.       Assessment & Plan:

## 2013-11-20 NOTE — Patient Instructions (Signed)
Continue on breo one inhalation each am, keep mouth rinsed Will check scan of your sinuses, and call you with results.  If you have abnormalities, will need antibiotics for 2-3 weeks. Will arrange for followup visit with me after seeing the scan of your sinuses.

## 2013-11-22 ENCOUNTER — Ambulatory Visit: Payer: Medicare Other | Admitting: Pulmonary Disease

## 2013-12-04 ENCOUNTER — Other Ambulatory Visit: Payer: Self-pay | Admitting: *Deleted

## 2013-12-04 ENCOUNTER — Encounter: Payer: Self-pay | Admitting: Family Medicine

## 2013-12-04 ENCOUNTER — Other Ambulatory Visit (INDEPENDENT_AMBULATORY_CARE_PROVIDER_SITE_OTHER): Payer: Medicare Other

## 2013-12-04 DIAGNOSIS — E559 Vitamin D deficiency, unspecified: Secondary | ICD-10-CM

## 2013-12-04 DIAGNOSIS — R739 Hyperglycemia, unspecified: Secondary | ICD-10-CM

## 2013-12-04 DIAGNOSIS — E785 Hyperlipidemia, unspecified: Secondary | ICD-10-CM

## 2013-12-04 DIAGNOSIS — R7309 Other abnormal glucose: Secondary | ICD-10-CM

## 2013-12-04 LAB — POCT CBC
GRANULOCYTE PERCENT: 73.2 % (ref 37–80)
HCT, POC: 40 % (ref 37.7–47.9)
Hemoglobin: 13.2 g/dL (ref 12.2–16.2)
LYMPH, POC: 1.5 (ref 0.6–3.4)
MCH: 30.3 pg (ref 27–31.2)
MCHC: 33 g/dL (ref 31.8–35.4)
MCV: 91.9 fL (ref 80–97)
MPV: 9.4 fL (ref 0–99.8)
PLATELET COUNT, POC: 290 10*3/uL (ref 142–424)
POC Granulocyte: 4.5 (ref 2–6.9)
POC LYMPH %: 24 % (ref 10–50)
RBC: 4.4 M/uL (ref 4.04–5.48)
RDW, POC: 13.7 %
WBC: 6.2 10*3/uL (ref 4.6–10.2)

## 2013-12-04 LAB — POCT GLYCOSYLATED HEMOGLOBIN (HGB A1C): HEMOGLOBIN A1C: 6.3

## 2013-12-05 ENCOUNTER — Other Ambulatory Visit (HOSPITAL_COMMUNITY): Payer: Self-pay | Admitting: Orthopedic Surgery

## 2013-12-05 ENCOUNTER — Telehealth: Payer: Self-pay | Admitting: *Deleted

## 2013-12-05 LAB — HEPATIC FUNCTION PANEL
ALT: 12 IU/L (ref 0–32)
AST: 17 IU/L (ref 0–40)
Albumin: 3.9 g/dL (ref 3.5–4.8)
Alkaline Phosphatase: 76 IU/L (ref 39–117)
BILIRUBIN TOTAL: 0.5 mg/dL (ref 0.0–1.2)
Bilirubin, Direct: 0.15 mg/dL (ref 0.00–0.40)
Total Protein: 6.1 g/dL (ref 6.0–8.5)

## 2013-12-05 LAB — BMP8+EGFR
BUN/Creatinine Ratio: 16 (ref 11–26)
BUN: 12 mg/dL (ref 8–27)
CALCIUM: 9.3 mg/dL (ref 8.7–10.3)
CO2: 22 mmol/L (ref 18–29)
Chloride: 102 mmol/L (ref 97–108)
Creatinine, Ser: 0.74 mg/dL (ref 0.57–1.00)
GFR calc Af Amer: 89 mL/min/{1.73_m2} (ref >59)
GFR calc non Af Amer: 77 mL/min/{1.73_m2} (ref >59)
Glucose: 133 mg/dL — ABNORMAL HIGH (ref 65–99)
Potassium: 4.4 mmol/L (ref 3.5–5.2)
Sodium: 141 mmol/L (ref 134–144)

## 2013-12-05 LAB — NMR, LIPOPROFILE
Cholesterol: 144 mg/dL (ref 100–199)
HDL CHOLESTEROL BY NMR: 45 mg/dL (ref >39)
HDL Particle Number: 30.1 umol/L — ABNORMAL LOW (ref >=30.5)
LDL Particle Number: 1026 nmol/L — ABNORMAL HIGH (ref <1000)
LDL Size: 20.7 nm (ref >20.5)
LDLC SERPL CALC-MCNC: 81 mg/dL (ref 0–99)
LP-IR Score: 33 (ref <=45)
Small LDL Particle Number: 448 nmol/L (ref <=527)
Triglycerides by NMR: 90 mg/dL (ref 0–149)

## 2013-12-05 LAB — VITAMIN D 25 HYDROXY (VIT D DEFICIENCY, FRACTURES): Vit D, 25-Hydroxy: 37.8 ng/mL (ref 30.0–100.0)

## 2013-12-05 NOTE — Telephone Encounter (Signed)
Message copied by Priscille Heidelberg on Tue Dec 05, 2013 11:56 AM ------      Message from: Chipper Herb      Created: Tue Dec 05, 2013  7:01 AM       The blood sugar is elevated at 133. The creatinine , the most important kidney function tests, is good and within normal limits at 0.74. The electrolytes including potassium are within normal limits.      All liver function tests are within normal limits      The total LDL [place] number is elevated at 1026. Previously it was 750. The LDL C. is good at 81. Triglycerides are good at 90.------ continue lovastatin, but try to do better with diet and exercise over the next 3 months when we recheck your cholesterol.      The vitamin D level is 37.8. Previously it was 57.9. Please confirm with the patient that she is taking her vitamin D regularly. ------

## 2013-12-05 NOTE — Telephone Encounter (Signed)
Patient aware. She is taking her vitamin d

## 2013-12-07 ENCOUNTER — Ambulatory Visit: Payer: Medicare Other | Admitting: Family Medicine

## 2013-12-14 ENCOUNTER — Ambulatory Visit (INDEPENDENT_AMBULATORY_CARE_PROVIDER_SITE_OTHER): Payer: Medicare Other | Admitting: Family Medicine

## 2013-12-14 ENCOUNTER — Encounter: Payer: Self-pay | Admitting: Family Medicine

## 2013-12-14 VITALS — BP 120/66 | HR 68 | Temp 98.0°F | Ht 62.0 in | Wt 158.0 lb

## 2013-12-14 DIAGNOSIS — Z780 Asymptomatic menopausal state: Secondary | ICD-10-CM

## 2013-12-14 DIAGNOSIS — F419 Anxiety disorder, unspecified: Secondary | ICD-10-CM

## 2013-12-14 DIAGNOSIS — E785 Hyperlipidemia, unspecified: Secondary | ICD-10-CM

## 2013-12-14 DIAGNOSIS — F411 Generalized anxiety disorder: Secondary | ICD-10-CM

## 2013-12-14 DIAGNOSIS — R350 Frequency of micturition: Secondary | ICD-10-CM

## 2013-12-14 DIAGNOSIS — M1611 Unilateral primary osteoarthritis, right hip: Secondary | ICD-10-CM

## 2013-12-14 DIAGNOSIS — IMO0001 Reserved for inherently not codable concepts without codable children: Secondary | ICD-10-CM

## 2013-12-14 DIAGNOSIS — M1610 Unilateral primary osteoarthritis, unspecified hip: Secondary | ICD-10-CM

## 2013-12-14 DIAGNOSIS — J449 Chronic obstructive pulmonary disease, unspecified: Secondary | ICD-10-CM

## 2013-12-14 LAB — POCT URINALYSIS DIPSTICK
Bilirubin, UA: NEGATIVE
Glucose, UA: NEGATIVE
Ketones, UA: NEGATIVE
Nitrite, UA: NEGATIVE
PH UA: 6
PROTEIN UA: NEGATIVE
SPEC GRAV UA: 1.01
UROBILINOGEN UA: NEGATIVE

## 2013-12-14 LAB — POCT UA - MICROSCOPIC ONLY
CRYSTALS, UR, HPF, POC: NEGATIVE
Casts, Ur, LPF, POC: NEGATIVE
Mucus, UA: NEGATIVE
YEAST UA: NEGATIVE

## 2013-12-14 NOTE — Patient Instructions (Addendum)
Medicare Annual Wellness Visit  [facility] and the medical providers at [facility] strive to bring you the best medical care.  In doing so we not only want to address your current medical conditions and concerns but also to detect new conditions early and prevent illness, disease and health-related problems.    Medicare offers a yearly Wellness Visit which allows our clinical staff to assess your need for preventative services including immunizations, lifestyle education, counseling to decrease risk of preventable diseases and screening for fall risk and other medical concerns.    This visit is provided free of charge (no copay) for all Medicare recipients. The clinical pharmacists at [facility] have begun to conduct these Wellness Visits which will also include a thorough review of all your medications.    As you primary medical provider recommend that you make an appointment for your Annual Wellness Visit if you have not done so already this year.  You may set up this appointment before you leave today or you may call back ([phone]) and schedule an appointment.  Please make sure when you call that you mention that you are scheduling your Annual Wellness Visit with the clinical pharmacist so that the appointment may be made for the proper length of time.     Continue current medications. Continue good therapeutic lifestyle changes which include good diet and exercise. Fall precautions discussed with patient. If an FOBT was given today- please return it to our front desk. If you are over 20 years old - you may need Prevnar 62 or the adult Pneumonia vaccine.  Flu Shots will be available at our office starting mid- September. Please call and schedule a FLU CLINIC APPOINTMENT.   Please be careful and to not put yourself at risk for falling before your surgery. Use a walker if possible. Use a sample inhaler since it is like the  one given to you by the pulmonologist until your admission to the hospital for your hip replacement. We will arrange for you to see the dermatologist that you have seen before for the right ear skin lesion after you get out of your surgery.

## 2013-12-14 NOTE — Progress Notes (Signed)
Subjective:    Patient ID: [name] [name], female    DOB: Feb 19, 1932, 80 y.o.   MRN: [id number]  HPI Pt here for follow up and management of chronic medical problems. The patient recently saw the pulmonologist and is doing better with her COPD and respiratory problems. She does complain of some left hip pain left rib pain and she is worried about a place on her right ear. We will review her recent lab work. She is also due to get a pelvic exam. She refuses to do the pelvic exam. She plans to have a left hip replacement on September 16th. She indicates that she hurts in the left rib area with sitting. A chest x-ray from a year ago was reviewed and this only showed some scar tissue in the left base.        Patient Active Problem List   Diagnosis Date Noted  . Chronic cough 10/26/2013  . hyperlipidemia   . Anxiety   . Depressive disorder, not elsewhere classified   . Rhinitis   . Elevated blood sugar   . Atrophic vaginitis   . Postmenopausal    Outpatient Encounter Prescriptions as of 12/14/2013  Medication Sig  . ALPRAZolam (XANAX) 0.25 MG tablet TAKE ONE TABLET BY MOUTH AT BEDTIME AS NEEDED  . amLODipine (NORVASC) 5 MG tablet TAKE ONE-HALF TABLET BY MOUTH ONE  TIME DAILY  . aspirin 81 MG EC tablet Take 81 mg by mouth daily.    . Calcium Carbonate-Vitamin D (OSCAL 500/200 D-3 PO) Take 1 capsule by mouth 2 (two) times daily.    . Cholecalciferol (VITAMIN D) 2000 UNITS CAPS Take 1 capsule by mouth daily.    . citalopram (CELEXA) 20 MG tablet Take 1 tablet by mouth daily.  . fluticasone (FLONASE) 50 MCG/ACT nasal spray USE ONE SPRAY EACH NOSTRIL ONE TIME DAILY  . lovastatin (MEVACOR) 40 MG tablet TAKE ONE half TABLET BY MOUTH ONE TIME DAILY  . Naproxen Sodium (ALEVE) 220 MG CAPS Take 1 capsule by mouth daily as needed.  . Omega-3 Fatty Acids (FISH OIL) 1200 MG CAPS Take 2 capsules by mouth daily.  Marland Kitchen triamcinolone cream (KENALOG) 0.1 %   . [DISCONTINUED] busPIRone (BUSPAR) 7.5 MG  tablet Take 1 tablet by mouth daily  . [DISCONTINUED] Fluticasone Furoate-Vilanterol (BREO ELLIPTA) 100-25 MCG/INH AEPB Inhale 1 puff into the lungs daily.    Review of Systems  Constitutional: Negative.   HENT: Negative.   Eyes: Negative.   Respiratory: Negative.   Cardiovascular: Negative.   Gastrointestinal: Negative.   Endocrine: Negative.   Genitourinary: Negative.   Musculoskeletal: Positive for arthralgias (left hip pain- surgery scheduled for 9/16) and myalgias (right rib area pain).  Skin: Negative.        Check place on right ear  Allergic/Immunologic: Negative.   Neurological: Negative.   Hematological: Negative.   Psychiatric/Behavioral: Negative.        Objective:   Physical Exam  Nursing note and vitals reviewed. Constitutional: She is oriented to person, place, and time. No distress.  Somewhat elderly appearing but alert  HENT:  Head: Normocephalic and atraumatic.  Left Ear: External ear normal.  Nose: Nose normal.  Mouth/Throat: Oropharynx is clear and moist.  Patch of dry skin at the opening of the right ear canal above the cartilage of the ear.  Eyes: Conjunctivae and EOM are normal. Pupils are equal, round, and reactive to light. Right eye exhibits no discharge. Left eye exhibits no discharge. No scleral icterus.  Neck:  Normal range of motion. Neck supple. No thyromegaly present.  No carotid bruit  Cardiovascular: Normal rate, regular rhythm, normal heart sounds and intact distal pulses.  Exam reveals no gallop and no friction rub.   No murmur heard. At 72 per minute  Pulmonary/Chest: Effort normal. No respiratory distress. She has wheezes. She has no rales. She exhibits no tenderness.  Slightly decreased breath sounds with occasional  wheezing  Abdominal: Soft. Bowel sounds are normal. She exhibits no mass. There is no tenderness. There is no rebound and no guarding.  No abdominal bruits  Musculoskeletal: She exhibits tenderness (left hip). She exhibits  no edema.  The patient walks hesitantly with a left because of pain in her left hip  Lymphadenopathy:    She has no cervical adenopathy.  Neurological: She is alert and oriented to person, place, and time. No cranial nerve deficit.  Leg raising is painful on the left side due to degenerative changes in her left hip  Skin: Skin is warm and dry. No rash noted.  Psychiatric: She has a normal mood and affect. Her behavior is normal. Judgment and thought content normal.   BP 120/66  Pulse 68  Temp(Src) 98 F (36.7 C) (Oral)  Ht 5\' 2"  (1.575 m)  Wt 158 lb (71.668 kg)  BMI 28.89 kg/m2        Assessment & Plan:  1. Anxiety  2. hyperlipidemia  3. Postmenopausal  4. Primary osteoarthritis of right hip  5. COPD bronchitis  Meds ordered this encounter  Medications  . citalopram (CELEXA) 20 MG tablet    Sig: Take 1 tablet by mouth daily.  . Naproxen Sodium (ALEVE) 220 MG CAPS    Sig: Take 1 capsule by mouth daily as needed.   Patient Instructions                       Medicare Annual Wellness Visit  [facility] and the medical providers at [facility] strive to bring you the best medical care.  In doing so we not only want to address your current medical conditions and concerns but also to detect new conditions early and prevent illness, disease and health-related problems.    Medicare offers a yearly Wellness Visit which allows our clinical staff to assess your need for preventative services including immunizations, lifestyle education, counseling to decrease risk of preventable diseases and screening for fall risk and other medical concerns.    This visit is provided free of charge (no copay) for all Medicare recipients. The clinical pharmacists at [facility] have begun to conduct these Wellness Visits which will also include a thorough review of all your medications.    As you primary medical provider recommend that you make an  appointment for your Annual Wellness Visit if you have not done so already this year.  You may set up this appointment before you leave today or you may call back ([phone]) and schedule an appointment.  Please make sure when you call that you mention that you are scheduling your Annual Wellness Visit with the clinical pharmacist so that the appointment may be made for the proper length of time.     Continue current medications. Continue good therapeutic lifestyle changes which include good diet and exercise. Fall precautions discussed with patient. If an FOBT was given today- please return it to our front desk. If you are over 55 years old - you may need Prevnar 23 or the adult Pneumonia  vaccine.  Flu Shots will be available at our office starting mid- September. Please call and schedule a FLU CLINIC APPOINTMENT.   Please be careful and to not put yourself at risk for falling before your surgery. Use a walker if possible. Use a sample inhaler since it is like the one given to you by the pulmonologist until your admission to the hospital for your hip replacement. We will arrange for you to see the dermatologist that you have seen before for the right ear skin lesion after you get out of your surgery.   Arrie Senate MD

## 2013-12-15 ENCOUNTER — Encounter (HOSPITAL_COMMUNITY): Payer: Self-pay | Admitting: Pharmacy Technician

## 2013-12-16 ENCOUNTER — Telehealth: Payer: Self-pay | Admitting: Family Medicine

## 2013-12-16 NOTE — Telephone Encounter (Signed)
Pt is not having symptoms, explained to her that we would nbt call in [place]

## 2013-12-16 NOTE — Telephone Encounter (Signed)
Please see note from reviewing lab work and call in [place] if the patient is having symptoms and let her know that a culture and sensitivity is pending

## 2013-12-18 ENCOUNTER — Other Ambulatory Visit: Payer: Self-pay | Admitting: *Deleted

## 2013-12-18 LAB — URINE CULTURE

## 2013-12-18 MED ORDER — CIPROFLOXACIN HCL 500 MG PO TABS: 500.0000 mg | ORAL_TABLET | Freq: Two times a day (BID) | ORAL | Status: DC

## 2013-12-19 ENCOUNTER — Other Ambulatory Visit: Payer: Medicare Other

## 2013-12-19 ENCOUNTER — Encounter (HOSPITAL_COMMUNITY)
Admission: RE | Admit: 2013-12-19 | Discharge: 2013-12-19 | Disposition: A | Discharge status: Home / Self Care | Payer: Medicare Other | Source: Ambulatory Visit | Attending: Orthopedic Surgery | Admitting: Orthopedic Surgery

## 2013-12-19 ENCOUNTER — Encounter (HOSPITAL_COMMUNITY): Payer: Self-pay

## 2013-12-19 DIAGNOSIS — Z01818 Encounter for other preprocedural examination: Secondary | ICD-10-CM | POA: Diagnosis present

## 2013-12-19 DIAGNOSIS — M169 Osteoarthritis of hip, unspecified: Secondary | ICD-10-CM | POA: Insufficient documentation

## 2013-12-19 DIAGNOSIS — M1610 Unilateral primary osteoarthritis, unspecified hip: Secondary | ICD-10-CM | POA: Insufficient documentation

## 2013-12-19 HISTORY — DX: Chronic obstructive pulmonary disease, unspecified: J44.9

## 2013-12-19 HISTORY — DX: Essential (primary) hypertension: I10

## 2013-12-19 HISTORY — DX: Emphysema, unspecified: J43.9

## 2013-12-19 LAB — CBC
HCT: 41.9 % (ref 36.0–46.0)
Hemoglobin: 13.9 g/dL (ref 12.0–15.0)
MCH: 30.7 pg (ref 26.0–34.0)
MCHC: 33.2 g/dL (ref 30.0–36.0)
MCV: 92.5 fL (ref 78.0–100.0)
PLATELETS: 276 10*3/uL (ref 150–400)
RBC: 4.53 MIL/uL (ref 3.87–5.11)
RDW: 13.5 % (ref 11.5–15.5)
WBC: 8.1 10*3/uL (ref 4.0–10.5)

## 2013-12-19 LAB — COMPREHENSIVE METABOLIC PANEL
ALT: 15 U/L (ref 0–35)
AST: 18 U/L (ref 0–37)
Albumin: 3.9 g/dL (ref 3.5–5.2)
Alkaline Phosphatase: 76 U/L (ref 39–117)
Anion gap: 12 (ref 5–15)
BUN: 15 mg/dL (ref 6–23)
CALCIUM: 9.6 mg/dL (ref 8.4–10.5)
CO2: 25 meq/L (ref 19–32)
Chloride: 101 mEq/L (ref 96–112)
Creatinine, Ser: 0.71 mg/dL (ref 0.50–1.10)
GFR calc Af Amer: >90 mL/min (ref >90)
GFR, EST NON AFRICAN AMERICAN: 80 mL/min — AB (ref >90)
Glucose, Bld: 103 mg/dL — ABNORMAL HIGH (ref 70–99)
POTASSIUM: 4.6 meq/L (ref 3.7–5.3)
SODIUM: 138 meq/L (ref 137–147)
Total Bilirubin: 0.6 mg/dL (ref 0.3–1.2)
Total Protein: 7.1 g/dL (ref 6.0–8.3)

## 2013-12-19 LAB — ABO/RH: ABO/RH(D): O NEG

## 2013-12-19 LAB — PROTIME-INR
INR: 1.01 (ref 0.00–1.49)
PROTHROMBIN TIME: 13.3 s (ref 11.6–15.2)

## 2013-12-19 LAB — TYPE AND SCREEN
ABO/RH(D): O NEG
ANTIBODY SCREEN: NEGATIVE

## 2013-12-19 LAB — SURGICAL PCR SCREEN
MRSA, PCR: NEGATIVE
Staphylococcus aureus: POSITIVE — AB

## 2013-12-19 LAB — APTT: APTT: 29 s (ref 24–37)

## 2013-12-19 NOTE — Pre-Procedure Instructions (Signed)
CHELESA WEINGARTNER  12/19/2013   Your procedure is scheduled on:   Wednesday  12/27/13  Report to [facility] Admitting at 630  AM.  Call this number if you have problems the morning of surgery: [phone]   Remember:   Do not eat food or drink liquids after midnight.   Take these medicines the morning of surgery with A SIP OF WATER:  TYLENOL, AMLODIPINE (NORVASC), CITALOPRAM , INHALER STOP FISHOIL, NAPROXEN (ALEVE), ASPIRIN !!!!!   Do not wear jewelry, make-up or nail polish.  Do not wear lotions, powders, or perfumes. You may wear deodorant.  Do not shave 48 hours prior to surgery. Men may shave face and neck.  Do not bring valuables to the hospital.  [facility] is not responsible                  for any belongings or valuables.               Contacts, dentures or bridgework may not be worn into surgery.  Leave suitcase in the car. After surgery it may be brought to your room.  For patients admitted to the hospital, discharge time is determined by your                treatment team.               Patients discharged the day of surgery will not be allowed to drive  home.  Name and phone number of your driver:   Special Instructions:  Special Instructions: [place] - Preparing for Surgery  Before surgery, you can play an important role.  Because skin is not sterile, your skin needs to be as free of germs as possible.  You can reduce the number of germs on you skin by washing with CHG (chlorahexidine gluconate) soap before surgery.  CHG is an antiseptic cleaner which kills germs and bonds with the skin to continue killing germs even after washing.  Please DO NOT use if you have an allergy to CHG or antibacterial soaps.  If your skin becomes reddened/irritated stop using the CHG and inform your nurse when you arrive at Short Stay.  Do not shave (including legs and underarms) for at least 48 hours prior to the first CHG shower.  You may shave your face.  Please follow these  instructions carefully:   1.  Shower with CHG Soap the night before surgery and the morning of Surgery.  2.  If you choose to wash your hair, wash your hair first as usual with your normal shampoo.  3.  After you shampoo, rinse your hair and body thoroughly to remove the Shampoo.  4.  Use CHG as you would any other liquid soap. You can apply chg directly to the skin and wash gently with scrungie or a clean washcloth.  5.  Apply the CHG Soap to your body ONLY FROM THE NECK DOWN.  Do not use on open wounds or open sores.  Avoid contact with your eyes, ears, mouth and genitals (private parts).  Wash genitals (private parts with your normal soap.  6.  Wash thoroughly, paying special attention to the area where your surgery will be performed.  7.  Thoroughly rinse your body with warm water from the neck down.  8.  DO NOT shower/wash with your normal soap after using and rinsing off the CHG Soap.  9.  Pat yourself dry with a clean towel.  10.  Wear clean pajamas.            11.  Place clean sheets on your bed the night of your first shower and do not sleep with pets.  Day of Surgery  Do not apply any lotions/deodorants the morning of surgery.  Please wear clean clothes to the hospital/surgery center.   Please read over the following fact sheets that you were given: Pain Booklet, Coughing and Deep Breathing, Blood Transfusion Information, Total Joint Packet, MRSA Information and Surgical Site Infection Prevention

## 2013-12-20 NOTE — Progress Notes (Signed)
Anesthesia Chart Review:  Pt is 80 year old female scheduled for L total hip arthroplasty on 12/27/13 with Dr. Sharol Given.   PMH: HTN, COPD, anxiety, depression  Medications include: amlodipine, fluticasone/vilanterol, celexa, xanax  Preoperative labs reviewed.   Chest x-ray 09/19/13 reviewed. No acute cardiopulmonary disease.   EKG: confirmed by Dr. Meda Coffee.  Normal sinus rhythm Left axis deviation Anteroseptal infarct , age undetermined No significant change since last tracing Jan 2012  If no changes, I anticipate pt can proceed with surgery as scheduled.   Willeen Cass, FNP-BC [facility]/Anesthesiology Phone: [phone] 12/20/2013 2:53 PM

## 2013-12-25 ENCOUNTER — Ambulatory Visit: Payer: Medicare Other | Admitting: Family Medicine

## 2013-12-26 MED ORDER — CLINDAMYCIN PHOSPHATE 900 MG/50ML IV SOLN
900.0000 mg | INTRAVENOUS | Status: AC
Start: 2013-12-27 — End: 2013-12-27
  Administered 2013-12-27: 900 mg via INTRAVENOUS
  Filled 2013-12-26: qty 50

## 2013-12-27 ENCOUNTER — Encounter (HOSPITAL_COMMUNITY): Payer: Medicare Other | Admitting: Emergency Medicine

## 2013-12-27 ENCOUNTER — Inpatient Hospital Stay (HOSPITAL_COMMUNITY): Payer: Medicare Other

## 2013-12-27 ENCOUNTER — Inpatient Hospital Stay (HOSPITAL_COMMUNITY)
Admission: RE | Admit: 2013-12-27 | Discharge: 2013-12-29 | DRG: 470 | Disposition: A | Discharge status: Skilled Nursing Facility | Payer: Medicare Other | Source: Ambulatory Visit | Attending: Orthopedic Surgery | Admitting: Orthopedic Surgery

## 2013-12-27 ENCOUNTER — Inpatient Hospital Stay (HOSPITAL_COMMUNITY): Payer: Medicare Other | Admitting: Anesthesiology

## 2013-12-27 ENCOUNTER — Encounter (HOSPITAL_COMMUNITY): Payer: Self-pay | Admitting: *Deleted

## 2013-12-27 ENCOUNTER — Encounter (HOSPITAL_COMMUNITY)
Admission: RE | Disposition: A | Discharge status: Skilled Nursing Facility | Payer: Self-pay | Source: Ambulatory Visit | Attending: Orthopedic Surgery

## 2013-12-27 DIAGNOSIS — Z7982 Long term (current) use of aspirin: Secondary | ICD-10-CM

## 2013-12-27 DIAGNOSIS — Z836 Family history of other diseases of the respiratory system: Secondary | ICD-10-CM

## 2013-12-27 DIAGNOSIS — M25559 Pain in unspecified hip: Secondary | ICD-10-CM | POA: Diagnosis present

## 2013-12-27 DIAGNOSIS — Z833 Family history of diabetes mellitus: Secondary | ICD-10-CM

## 2013-12-27 DIAGNOSIS — Z880 Allergy status to penicillin: Secondary | ICD-10-CM

## 2013-12-27 DIAGNOSIS — Z780 Asymptomatic menopausal state: Secondary | ICD-10-CM

## 2013-12-27 DIAGNOSIS — M169 Osteoarthritis of hip, unspecified: Secondary | ICD-10-CM | POA: Diagnosis present

## 2013-12-27 DIAGNOSIS — M1610 Unilateral primary osteoarthritis, unspecified hip: Principal | ICD-10-CM | POA: Diagnosis present

## 2013-12-27 DIAGNOSIS — Z96649 Presence of unspecified artificial hip joint: Secondary | ICD-10-CM

## 2013-12-27 DIAGNOSIS — Z888 Allergy status to other drugs, medicaments and biological substances status: Secondary | ICD-10-CM | POA: Diagnosis not present

## 2013-12-27 DIAGNOSIS — Z882 Allergy status to sulfonamides status: Secondary | ICD-10-CM | POA: Diagnosis not present

## 2013-12-27 DIAGNOSIS — Z79899 Other long term (current) drug therapy: Secondary | ICD-10-CM | POA: Diagnosis not present

## 2013-12-27 DIAGNOSIS — I10 Essential (primary) hypertension: Secondary | ICD-10-CM | POA: Diagnosis present

## 2013-12-27 DIAGNOSIS — F329 Major depressive disorder, single episode, unspecified: Secondary | ICD-10-CM | POA: Diagnosis present

## 2013-12-27 DIAGNOSIS — Z881 Allergy status to other antibiotic agents status: Secondary | ICD-10-CM | POA: Diagnosis not present

## 2013-12-27 DIAGNOSIS — R339 Retention of urine, unspecified: Secondary | ICD-10-CM | POA: Diagnosis not present

## 2013-12-27 DIAGNOSIS — J438 Other emphysema: Secondary | ICD-10-CM | POA: Diagnosis present

## 2013-12-27 DIAGNOSIS — F411 Generalized anxiety disorder: Secondary | ICD-10-CM | POA: Diagnosis present

## 2013-12-27 DIAGNOSIS — Z8261 Family history of arthritis: Secondary | ICD-10-CM | POA: Diagnosis not present

## 2013-12-27 DIAGNOSIS — E785 Hyperlipidemia, unspecified: Secondary | ICD-10-CM | POA: Diagnosis present

## 2013-12-27 DIAGNOSIS — F3289 Other specified depressive episodes: Secondary | ICD-10-CM | POA: Diagnosis present

## 2013-12-27 HISTORY — PX: TOTAL HIP ARTHROPLASTY: SHX124

## 2013-12-27 SURGERY — ARTHROPLASTY, HIP, TOTAL,POSTERIOR APPROACH
Anesthesia: General | Site: Hip | Laterality: Left

## 2013-12-27 MED ORDER — GLYCOPYRROLATE 0.2 MG/ML IJ SOLN
INTRAMUSCULAR | Status: AC
  Filled 2013-12-27: qty 3

## 2013-12-27 MED ORDER — METHOCARBAMOL 1000 MG/10ML IJ SOLN
500.0000 mg | Freq: Four times a day (QID) | INTRAVENOUS | Status: DC | PRN
  Administered 2013-12-27: 500 mg via INTRAVENOUS
  Filled 2013-12-27: qty 5

## 2013-12-27 MED ORDER — DIPHENHYDRAMINE HCL 12.5 MG/5ML PO ELIX: 12.5000 mg | ORAL_SOLUTION | ORAL | Status: DC | PRN

## 2013-12-27 MED ORDER — PROPOFOL 10 MG/ML IV BOLUS
INTRAVENOUS | Status: DC | PRN
Start: 2013-12-27 — End: 2013-12-27
  Administered 2013-12-27: 170 mg via INTRAVENOUS

## 2013-12-27 MED ORDER — ONDANSETRON HCL 4 MG/2ML IJ SOLN
INTRAMUSCULAR | Status: AC
  Filled 2013-12-27: qty 2

## 2013-12-27 MED ORDER — ASPIRIN EC 325 MG PO TBEC
325.0000 mg | DELAYED_RELEASE_TABLET | Freq: Every day | ORAL | Status: DC
  Administered 2013-12-28 – 2013-12-29 (×2): 325 mg via ORAL
  Filled 2013-12-27 (×3): qty 1

## 2013-12-27 MED ORDER — HYDROMORPHONE HCL PF 1 MG/ML IJ SOLN
INTRAMUSCULAR | Status: AC
  Filled 2013-12-27: qty 1

## 2013-12-27 MED ORDER — LACTATED RINGERS IV SOLN
INTRAVENOUS | Status: DC | PRN
  Administered 2013-12-27 (×2): via INTRAVENOUS

## 2013-12-27 MED ORDER — ROCURONIUM BROMIDE 50 MG/5ML IV SOLN
INTRAVENOUS | Status: AC
  Filled 2013-12-27: qty 1

## 2013-12-27 MED ORDER — MAGNESIUM CITRATE PO SOLN: 1.0000 | Freq: Once | ORAL | Status: AC | PRN

## 2013-12-27 MED ORDER — MIDAZOLAM HCL 2 MG/2ML IJ SOLN
INTRAMUSCULAR | Status: AC
  Filled 2013-12-27: qty 2

## 2013-12-27 MED ORDER — FLUTICASONE PROPIONATE 50 MCG/ACT NA SUSP
1.0000 | Freq: Every day | NASAL | Status: DC
  Administered 2013-12-27 – 2013-12-29 (×3): 1 via NASAL
  Filled 2013-12-27: qty 16

## 2013-12-27 MED ORDER — PHENYLEPHRINE HCL 10 MG/ML IJ SOLN
10.0000 mg | INTRAVENOUS | Status: DC | PRN
  Administered 2013-12-27: 25 ug/min via INTRAVENOUS

## 2013-12-27 MED ORDER — ESMOLOL HCL 10 MG/ML IV SOLN
INTRAVENOUS | Status: AC
  Filled 2013-12-27: qty 10

## 2013-12-27 MED ORDER — GLYCOPYRROLATE 0.2 MG/ML IJ SOLN
INTRAMUSCULAR | Status: DC | PRN
  Administered 2013-12-27: 0.6 mg via INTRAVENOUS

## 2013-12-27 MED ORDER — MENTHOL 3 MG MT LOZG
1.0000 | LOZENGE | OROMUCOSAL | Status: DC | PRN
  Filled 2013-12-27: qty 9

## 2013-12-27 MED ORDER — KETOROLAC TROMETHAMINE 15 MG/ML IJ SOLN
INTRAMUSCULAR | Status: AC
  Filled 2013-12-27: qty 1

## 2013-12-27 MED ORDER — METHOCARBAMOL 500 MG PO TABS
500.0000 mg | ORAL_TABLET | Freq: Four times a day (QID) | ORAL | Status: DC | PRN
  Administered 2013-12-27 – 2013-12-29 (×4): 500 mg via ORAL
  Filled 2013-12-27 (×5): qty 1

## 2013-12-27 MED ORDER — HYDROMORPHONE HCL 1 MG/ML IJ SOLN
1.0000 mg | INTRAMUSCULAR | Status: DC | PRN
  Administered 2013-12-27 – 2013-12-28 (×2): 1 mg via INTRAVENOUS
  Filled 2013-12-27 (×2): qty 1

## 2013-12-27 MED ORDER — ALUM & MAG HYDROXIDE-SIMETH 200-200-20 MG/5ML PO SUSP: 30.0000 mL | ORAL | Status: DC | PRN

## 2013-12-27 MED ORDER — STERILE WATER FOR INJECTION IJ SOLN
INTRAMUSCULAR | Status: AC
  Filled 2013-12-27: qty 10

## 2013-12-27 MED ORDER — HYDROCODONE-ACETAMINOPHEN 5-325 MG PO TABS
ORAL_TABLET | ORAL | Status: AC
  Administered 2013-12-27: 2 via ORAL
  Filled 2013-12-27: qty 2

## 2013-12-27 MED ORDER — HYDROMORPHONE HCL PF 1 MG/ML IJ SOLN
0.2500 mg | INTRAMUSCULAR | Status: DC | PRN
  Administered 2013-12-27: 0.25 mg via INTRAVENOUS
  Administered 2013-12-27: 0.5 mg via INTRAVENOUS
  Administered 2013-12-27 (×3): 0.25 mg via INTRAVENOUS

## 2013-12-27 MED ORDER — NEOSTIGMINE METHYLSULFATE 10 MG/10ML IV SOLN
INTRAVENOUS | Status: AC
  Filled 2013-12-27: qty 1

## 2013-12-27 MED ORDER — CITALOPRAM HYDROBROMIDE 20 MG PO TABS
20.0000 mg | ORAL_TABLET | Freq: Every day | ORAL | Status: DC
  Administered 2013-12-28 – 2013-12-29 (×2): 20 mg via ORAL
  Filled 2013-12-27 (×2): qty 1

## 2013-12-27 MED ORDER — NEOSTIGMINE METHYLSULFATE 10 MG/10ML IV SOLN
INTRAVENOUS | Status: DC | PRN
  Administered 2013-12-27: 4 mg via INTRAVENOUS

## 2013-12-27 MED ORDER — SENNOSIDES-DOCUSATE SODIUM 8.6-50 MG PO TABS: 1.0000 | ORAL_TABLET | Freq: Every evening | ORAL | Status: DC | PRN

## 2013-12-27 MED ORDER — ARTIFICIAL TEARS OP OINT
TOPICAL_OINTMENT | OPHTHALMIC | Status: AC
  Filled 2013-12-27: qty 3.5

## 2013-12-27 MED ORDER — ACETAMINOPHEN 650 MG RE SUPP: 650.0000 mg | Freq: Four times a day (QID) | RECTAL | Status: DC | PRN

## 2013-12-27 MED ORDER — KETOROLAC TROMETHAMINE 15 MG/ML IJ SOLN
7.5000 mg | Freq: Four times a day (QID) | INTRAMUSCULAR | Status: AC
  Administered 2013-12-27 – 2013-12-28 (×4): 7.5 mg via INTRAVENOUS

## 2013-12-27 MED ORDER — BISACODYL 5 MG PO TBEC
5.0000 mg | DELAYED_RELEASE_TABLET | Freq: Every day | ORAL | Status: DC | PRN
  Filled 2013-12-27: qty 1

## 2013-12-27 MED ORDER — SODIUM CHLORIDE 0.9 % IR SOLN
Status: DC | PRN
  Administered 2013-12-27: 1

## 2013-12-27 MED ORDER — EPHEDRINE SULFATE 50 MG/ML IJ SOLN
INTRAMUSCULAR | Status: AC
  Filled 2013-12-27: qty 1

## 2013-12-27 MED ORDER — LIDOCAINE HCL (CARDIAC) 20 MG/ML IV SOLN
INTRAVENOUS | Status: DC | PRN
  Administered 2013-12-27: 80 mg via INTRAVENOUS

## 2013-12-27 MED ORDER — HYDROCODONE-ACETAMINOPHEN 5-325 MG PO TABS
1.0000 | ORAL_TABLET | ORAL | Status: DC | PRN
  Administered 2013-12-27 – 2013-12-28 (×3): 2 via ORAL
  Administered 2013-12-28: 1 via ORAL
  Administered 2013-12-28 – 2013-12-29 (×2): 2 via ORAL
  Administered 2013-12-29: 1 via ORAL
  Filled 2013-12-27 (×2): qty 2
  Filled 2013-12-27 (×3): qty 1
  Filled 2013-12-27 (×3): qty 2

## 2013-12-27 MED ORDER — EPHEDRINE SULFATE 50 MG/ML IJ SOLN
INTRAMUSCULAR | Status: DC | PRN
  Administered 2013-12-27 (×3): 10 mg via INTRAVENOUS

## 2013-12-27 MED ORDER — SODIUM CHLORIDE 0.9 % IV SOLN
INTRAVENOUS | Status: DC
  Administered 2013-12-27: 14:00:00 via INTRAVENOUS

## 2013-12-27 MED ORDER — AMLODIPINE BESYLATE 5 MG PO TABS
5.0000 mg | ORAL_TABLET | Freq: Every day | ORAL | Status: DC
  Administered 2013-12-27 – 2013-12-29 (×3): 5 mg via ORAL
  Filled 2013-12-27 (×3): qty 1

## 2013-12-27 MED ORDER — PHENYLEPHRINE 40 MCG/ML (10ML) SYRINGE FOR IV PUSH (FOR BLOOD PRESSURE SUPPORT)
PREFILLED_SYRINGE | INTRAVENOUS | Status: AC
  Filled 2013-12-27: qty 10

## 2013-12-27 MED ORDER — ALPRAZOLAM 0.25 MG PO TABS
ORAL_TABLET | ORAL | Status: AC
  Administered 2013-12-27: 0.25 mg via ORAL
  Filled 2013-12-27: qty 1

## 2013-12-27 MED ORDER — SUCCINYLCHOLINE CHLORIDE 20 MG/ML IJ SOLN
INTRAMUSCULAR | Status: AC
  Filled 2013-12-27: qty 1

## 2013-12-27 MED ORDER — FERROUS SULFATE 325 (65 FE) MG PO TABS
325.0000 mg | ORAL_TABLET | Freq: Three times a day (TID) | ORAL | Status: DC
  Administered 2013-12-27 – 2013-12-29 (×5): 325 mg via ORAL
  Filled 2013-12-27 (×9): qty 1

## 2013-12-27 MED ORDER — ONDANSETRON HCL 4 MG/2ML IJ SOLN: 4.0000 mg | Freq: Four times a day (QID) | INTRAMUSCULAR | Status: DC | PRN

## 2013-12-27 MED ORDER — DOCUSATE SODIUM 100 MG PO CAPS
100.0000 mg | ORAL_CAPSULE | Freq: Two times a day (BID) | ORAL | Status: DC
  Administered 2013-12-27 – 2013-12-29 (×4): 100 mg via ORAL
  Filled 2013-12-27 (×5): qty 1

## 2013-12-27 MED ORDER — METOCLOPRAMIDE HCL 10 MG PO TABS: 5.0000 mg | ORAL_TABLET | Freq: Three times a day (TID) | ORAL | Status: DC | PRN

## 2013-12-27 MED ORDER — PROPOFOL 10 MG/ML IV BOLUS
INTRAVENOUS | Status: AC
  Filled 2013-12-27: qty 20

## 2013-12-27 MED ORDER — PHENYLEPHRINE HCL 10 MG/ML IJ SOLN
INTRAMUSCULAR | Status: DC | PRN
  Administered 2013-12-27 (×2): 80 ug via INTRAVENOUS

## 2013-12-27 MED ORDER — CLINDAMYCIN PHOSPHATE 600 MG/50ML IV SOLN
600.0000 mg | Freq: Four times a day (QID) | INTRAVENOUS | Status: AC
  Administered 2013-12-27 (×2): 600 mg via INTRAVENOUS
  Filled 2013-12-27 (×2): qty 50

## 2013-12-27 MED ORDER — ONDANSETRON HCL 4 MG/2ML IJ SOLN
INTRAMUSCULAR | Status: DC | PRN
  Administered 2013-12-27: 4 mg via INTRAVENOUS

## 2013-12-27 MED ORDER — ACETAMINOPHEN 325 MG PO TABS: 650.0000 mg | ORAL_TABLET | Freq: Four times a day (QID) | ORAL | Status: DC | PRN

## 2013-12-27 MED ORDER — FENTANYL CITRATE 0.05 MG/ML IJ SOLN
INTRAMUSCULAR | Status: AC
  Filled 2013-12-27: qty 5

## 2013-12-27 MED ORDER — ESMOLOL HCL 10 MG/ML IV SOLN
INTRAVENOUS | Status: DC | PRN
  Administered 2013-12-27: 20 mg via INTRAVENOUS

## 2013-12-27 MED ORDER — METOCLOPRAMIDE HCL 5 MG/ML IJ SOLN: 5.0000 mg | Freq: Three times a day (TID) | INTRAMUSCULAR | Status: DC | PRN

## 2013-12-27 MED ORDER — INFLUENZA VAC SPLIT QUAD 0.5 ML IM SUSY
0.5000 mL | PREFILLED_SYRINGE | INTRAMUSCULAR | Status: AC
  Administered 2013-12-29: 0.5 mL via INTRAMUSCULAR
  Filled 2013-12-27: qty 0.5

## 2013-12-27 MED ORDER — FENTANYL CITRATE 0.05 MG/ML IJ SOLN
INTRAMUSCULAR | Status: DC | PRN
Start: 2013-12-27 — End: 2013-12-27
  Administered 2013-12-27 (×5): 50 ug via INTRAVENOUS

## 2013-12-27 MED ORDER — ARTIFICIAL TEARS OP OINT
TOPICAL_OINTMENT | OPHTHALMIC | Status: DC | PRN
  Administered 2013-12-27: 1 via OPHTHALMIC

## 2013-12-27 MED ORDER — SIMVASTATIN 5 MG PO TABS
5.0000 mg | ORAL_TABLET | Freq: Every day | ORAL | Status: DC
  Administered 2013-12-27 – 2013-12-28 (×2): 5 mg via ORAL
  Filled 2013-12-27 (×3): qty 1

## 2013-12-27 MED ORDER — LIDOCAINE HCL (CARDIAC) 20 MG/ML IV SOLN
INTRAVENOUS | Status: AC
  Filled 2013-12-27: qty 5

## 2013-12-27 MED ORDER — ALPRAZOLAM 0.25 MG PO TABS
0.2500 mg | ORAL_TABLET | Freq: Every evening | ORAL | Status: DC | PRN
  Administered 2013-12-27: 0.25 mg via ORAL

## 2013-12-27 MED ORDER — PHENOL 1.4 % MT LIQD: 1.0000 | OROMUCOSAL | Status: DC | PRN

## 2013-12-27 MED ORDER — ONDANSETRON HCL 4 MG PO TABS: 4.0000 mg | ORAL_TABLET | Freq: Four times a day (QID) | ORAL | Status: DC | PRN

## 2013-12-27 MED ORDER — ROCURONIUM BROMIDE 100 MG/10ML IV SOLN
INTRAVENOUS | Status: DC | PRN
  Administered 2013-12-27: 40 mg via INTRAVENOUS

## 2013-12-27 SURGICAL SUPPLY — 45 items
BLADE SAW SAG 73X25 THK (BLADE) ×1
BLADE SAW SGTL 73X25 THK (BLADE) ×1 IMPLANT
BLADE SURG 10 STRL SS (BLADE) IMPLANT
BLADE SURG 21 STRL SS (BLADE) ×2 IMPLANT
BRUSH FEMORAL CANAL (MISCELLANEOUS) IMPLANT
CAP POROUS W/METAL ON POLY ×1 IMPLANT
COVER BACK TABLE 24X17X13 BIG (DRAPES) ×1 IMPLANT
COVER SURGICAL LIGHT HANDLE (MISCELLANEOUS) ×2 IMPLANT
DRAPE INCISE IOBAN 85X60 (DRAPES) ×2 IMPLANT
DRAPE ORTHO SPLIT 77X108 STRL (DRAPES) ×4
DRAPE SURG ORHT 6 SPLT 77X108 (DRAPES) ×2 IMPLANT
DRAPE U-SHAPE 47X51 STRL (DRAPES) ×2 IMPLANT
DRSG MEPILEX BORDER 4X12 (GAUZE/BANDAGES/DRESSINGS) ×1 IMPLANT
DRSG MEPILEX BORDER 4X8 (GAUZE/BANDAGES/DRESSINGS) IMPLANT
DURAPREP 26ML APPLICATOR (WOUND CARE) ×2 IMPLANT
ELECT BLADE 6.5 EXT (BLADE) ×1 IMPLANT
ELECT CAUTERY BLADE 6.4 (BLADE) ×2 IMPLANT
ELECT REM PT RETURN 9FT ADLT (ELECTROSURGICAL) ×2
ELECTRODE REM PT RTRN 9FT ADLT (ELECTROSURGICAL) ×1 IMPLANT
GLOVE BIOGEL PI IND STRL 9 (GLOVE) ×1 IMPLANT
GLOVE BIOGEL PI INDICATOR 9 (GLOVE) ×2
GLOVE SURG ORTHO 9.0 STRL STRW (GLOVE) ×3 IMPLANT
GOWN STRL REUS W/ TWL XL LVL3 (GOWN DISPOSABLE) ×2 IMPLANT
GOWN STRL REUS W/TWL XL LVL3 (GOWN DISPOSABLE) ×4
HANDPIECE INTERPULSE COAX TIP (DISPOSABLE)
KIT BASIN OR (CUSTOM PROCEDURE TRAY) ×2 IMPLANT
KIT ROOM TURNOVER OR (KITS) ×2 IMPLANT
MANIFOLD NEPTUNE II (INSTRUMENTS) ×2 IMPLANT
NS IRRIG 1000ML POUR BTL (IV SOLUTION) ×2 IMPLANT
PACK TOTAL JOINT (CUSTOM PROCEDURE TRAY) ×2 IMPLANT
PAD ARMBOARD 7.5X6 YLW CONV (MISCELLANEOUS) ×4 IMPLANT
PRESSURIZER FEMORAL UNIV (MISCELLANEOUS) IMPLANT
SET HNDPC FAN SPRY TIP SCT (DISPOSABLE) IMPLANT
STAPLER VISISTAT 35W (STAPLE) ×2 IMPLANT
SUT ETHIBOND NAB CT1 #1 30IN (SUTURE) ×2 IMPLANT
SUT VIC AB 0 CT1 27 (SUTURE) ×4
SUT VIC AB 0 CT1 27XBRD ANBCTR (SUTURE) ×2 IMPLANT
SUT VIC AB 1 CTX 36 (SUTURE) ×2
SUT VIC AB 1 CTX36XBRD ANBCTR (SUTURE) ×1 IMPLANT
SUT VIC AB 2-0 CTB1 (SUTURE) ×4 IMPLANT
TOWEL OR 17X24 6PK STRL BLUE (TOWEL DISPOSABLE) ×2 IMPLANT
TOWEL OR 17X26 10 PK STRL BLUE (TOWEL DISPOSABLE) ×2 IMPLANT
TOWER CARTRIDGE SMART MIX (DISPOSABLE) IMPLANT
TRAY FOLEY CATH 16FRSI W/METER (SET/KITS/TRAYS/PACK) IMPLANT
WATER STERILE IRR 1000ML POUR (IV SOLUTION) ×4 IMPLANT

## 2013-12-27 NOTE — Op Note (Signed)
12/27/2013  9:47 AM  PATIENT:  [name] [name]    PRE-OPERATIVE DIAGNOSIS:  Osteoarthritis Left Hip  POST-OPERATIVE DIAGNOSIS:  Same  PROCEDURE:  LEFT TOTAL HIP ARTHROPLASTY  SURGEON:  Newt Minion, MD  PHYSICIAN ASSISTANT:None ANESTHESIA:   General  PREOPERATIVE INDICATIONS:  [name] is a  80 y.o. female with a diagnosis of Osteoarthritis Left Hip who failed conservative measures and elected for surgical management.    The risks benefits and alternatives were discussed with the patient preoperatively including but not limited to the risks of infection, bleeding, nerve injury, cardiopulmonary complications, the need for revision surgery, among others, and the patient was willing to proceed.  OPERATIVE IMPLANTS: Zimmer implants. Size 50 mm acetabulum. 0 polyethylene liner. 36 mm metal head. Size 9 femur. Anteverted neutral stem.  OPERATIVE FINDINGS: Osteoarthritis left hip  OPERATIVE PROCEDURE: Patient is a 80 year old woman who has had progressive arthritic pain of her left hip she has failed conservative care and presents at this time for surgical intervention. Risks and benefits were discussed including infection neurovascular injury DVT pulmonary embolus dislocation and need for additional surgery. Patient states she understands and wished to proceed at this time.  Patient was brought to the operating room and underwent a general anesthetic. After adequate levels of anesthesia were obtained patient was placed in the right lateral decubitus position with the left side up and the left lower extremity was prepped using DuraPrep draped in the sterile field an Ioban was used to cover all exposed skin. A timeout was called. A posterior lateral incision was made this carried down to the tensor fascia lata which was split. The piriformis short external rotators and capsule were incised off the neck of the femur retracted and tagged. The hip was dislocated. The neck cut was made 1  cm proximal to the calcar. The acetabulum was sequentially reamed to 50 mm and a 50 mm acetabular socket was placed 20 of anteversion 45 of abduction. The 0 polyethylene liner was inserted. The femur was then sequentially broached to a size 9 stem the size 9 stem was inserted and trial neck implants were used. The 0 anteverted neck was stable. Patient had full flexion to 110 full abduction and internal rotation of 70 with the hip stable. The hip was then dislocated the wound is irrigated throughout the case. The final neck and head were inserted. This was reduced the hip was placed a full range of motion was stable. The capsule short external rotators and therefore was reapproximated using #1 Ethibond. The tensor fascia lata was closed using #1 Vicryl. Subcutaneous was closed using 0 Vicryl. The wound was covered with a sterile dressing. Patient was extubated taken to the PACU in stable condition.

## 2013-12-27 NOTE — Anesthesia Postprocedure Evaluation (Signed)
  Anesthesia Post-op Note  Patient: [name] [name]  Procedure(s) Performed: Procedure(s): LEFT TOTAL HIP ARTHROPLASTY (Left)  Patient Location: PACU  Anesthesia Type:General  Level of Consciousness: awake  Airway and Oxygen Therapy: Patient Spontanous Breathing  Post-op Pain: mild  Post-op Assessment: Post-op Vital signs reviewed  Post-op Vital Signs: Reviewed  Last Vitals:  Filed Vitals:   12/27/13 1147  BP: 124/53  Pulse: 108  Temp:   Resp: 13    Complications: No apparent anesthesia complications

## 2013-12-27 NOTE — Evaluation (Signed)
Physical Therapy Evaluation Patient Details Name: [name] [name] MRN: [id number] DOB: 1931/11/21 Today's Date: 12/27/2013   History of Present Illness  Patient is a 80 y/o female s/p L THA. Posterior hip precautions. PMH of HTN, COPD and arthritis.  Clinical Impression  Patient presents with post surgical deficits of LLE secondary to recent surgery. Pt lethargic today and limited by pain. Education provided on posterior hip precautions. Pt able to recall 1/3 within session. Tolerated standing and taking a few side steps with mod A and constant cuing for sequencing and weight shifting. Anticipate mobility will improve when pt less lethargic and pain more controlled. Pt given dilaudid during session. Pt would benefit from acute PT to improve transfers, gait, balance and overall mobility so pt can maximize independence and return to PLOF. Discussed disposition options. Educated family that going home will depend on progress with therapy tomorrow. Family and pt would really like to return home if at all possible with HHPT however at this time [facility] seems more appropriate.    Follow Up Recommendations  (Pt and husband would like patient to come home with HHPT pending progress.)    Equipment Recommendations  Rolling walker with 5" wheels    Recommendations for Other Services       Precautions / Restrictions Precautions Precautions: Posterior Hip;Fall Precaution Booklet Issued: Yes (comment) Precaution Comments: issued precaution handout.      Mobility  Bed Mobility Overal bed mobility: Needs Assistance Bed Mobility: Rolling;Sidelying to Sit;Sit to Sidelying Rolling: Min guard Sidelying to sit: Mod assist;HOB elevated     Sit to sidelying: Mod assist;HOB elevated General bed mobility comments: Use of rails for assist. VC for step by step sequencing to perform transfers. Assist with LLE, scooting bottom and to elevate trunk. Required assist with BLEs to return to supine and for  repositioning.  Transfers Overall transfer level: Needs assistance Equipment used: Rolling walker (2 wheeled) Transfers: Sit to/from Stand Sit to Stand: Min assist         General transfer comment: Able to stand from EOB with VC for hand placement as pt wanted to pull up on RW. Pt with tendency to perform valsalva manuever during transfers/mobiltiy resulting in elevated HR and drop in Sa02. VC for pursed lip breathing.  Ambulation/Gait Ambulation/Gait assistance: Mod assist Ambulation Distance (Feet): 3 Feet Assistive device: Rolling walker (2 wheeled) Gait Pattern/deviations: Step-to pattern;Shuffle;Trunk flexed;Decreased stance time - left;Decreased weight shift to left Gait velocity: decreased   General Gait Details: Pt with constant cues for weightshifting to clear BLEs to initiate pregait activities. Difficulty lifting RLE due to pain with WB through LLE. Pt lethargic during treatment due to being given dialudid during session. Needed to sit due to increase in HR. Able to side step a few steps with Mod A and max cues.   Stairs            Wheelchair Mobility    Modified Rankin (Stroke Patients Only)       Balance Overall balance assessment: Needs assistance Sitting-balance support: Bilateral upper extremity supported;Feet supported Sitting balance-Leahy Scale: Fair Sitting balance - Comments: Able to sit EOB with UE supported on bed or walker handles. Lethargic requiring Min guard assist for safety.   Standing balance support: Bilateral upper extremity supported;During functional activity Standing balance-Leahy Scale: Poor Standing balance comment: Requires BUE support on RW for safety.  Pertinent Vitals/Pain Pain Assessment: 0-10 Pain Score: 8  Pain Location: left hip Pain Descriptors / Indicators: Sharp;Sore;Aching Pain Intervention(s): Limited activity within patient's tolerance;Monitored during session;RN gave pain  meds during session;Repositioned;Ice applied    Home Living Family/patient expects to be discharged to:: Private residence Living Arrangements: Spouse/significant other Available Help at Discharge: Family;Available 24 hours/day Type of Home: House Home Access: Stairs to enter Entrance Stairs-Rails: Right Entrance Stairs-Number of Steps: 3 Home Layout: One level Home Equipment: Cane - single point      Prior Function Level of Independence: Independent         Comments: Pt is (I) with ADLs, IADLs and works at [employer]. Does not drive. Was using SPC recently due to pain in left hip.     Hand Dominance        Extremity/Trunk Assessment   Upper Extremity Assessment: Defer to OT evaluation           Lower Extremity Assessment: Generalized weakness;LLE deficits/detail   LLE Deficits / Details: Post surgical ROM and strength deficits LLE. guarded with all movement secondary to pain.     Communication   Communication: No difficulties  Cognition Arousal/Alertness: Lethargic Behavior During Therapy: WFL for tasks assessed/performed Overall Cognitive Status: Within Functional Limits for tasks assessed (Able to recall 1/3 hip precautions.)                      General Comments General comments (skin integrity, edema, etc.): Surgical site- clean, dry and intact.     Exercises Total Joint Exercises Ankle Circles/Pumps: Both;10 reps;Seated Quad Sets: Both;10 reps;Seated Long Arc Quad: Both;5 reps;Seated      Assessment/Plan    PT Assessment Patient needs continued PT services  PT Diagnosis Acute pain;Generalized weakness;Difficulty walking   PT Problem List Decreased strength;Pain;Cardiopulmonary status limiting activity;Decreased range of motion;Decreased activity tolerance;Decreased knowledge of use of DME;Decreased safety awareness;Decreased balance;Decreased mobility;Decreased knowledge of precautions;Decreased skin integrity  PT Treatment Interventions DME  instruction;Balance training;Gait training;Stair training;Patient/family education;Functional mobility training;Therapeutic activities;Therapeutic exercise   PT Goals (Current goals can be found in the Care Plan section) Acute Rehab PT Goals Patient Stated Goal: to get home and back to work PT Goal Formulation: With patient Time For Goal Achievement: 01/10/14 Potential to Achieve Goals: Good    Frequency 7X/week   Barriers to discharge   Stairs to enter home.    Co-evaluation               End of Session Equipment Utilized During Treatment: Gait belt;Oxygen Activity Tolerance: Patient limited by pain;Treatment limited secondary to medical complications (Comment) (Limited due to elevated HR and decrease in SA02.) Patient left: in bed;with bed alarm set;with call bell/phone within reach;with family/visitor present;with nursing/sitter in room Nurse Communication: Mobility status;Precautions         Time: 7711-6579 PT Time Calculation (min): 32 min   Charges:   PT Evaluation $Initial PT Evaluation Tier I: 1 Procedure PT Treatments $Therapeutic Activity: 8-22 mins   PT G CodesCandy Sledge A 12/27/2013, 4:10 PM Candy Sledge, [place], DPT [phone]

## 2013-12-27 NOTE — H&P (Signed)
TOTAL HIP ADMISSION H&P  Patient is admitted for left total hip arthroplasty.  Subjective:  Chief Complaint: left hip pain  HPI: [name] [name], 80 y.o. female, has a history of pain and functional disability in the left hip(s) due to arthritis and patient has failed non-surgical conservative treatments for greater than 12 weeks to include NSAID's and/or analgesics, use of assistive devices, weight reduction as appropriate and activity modification.  Onset of symptoms was gradual starting 8 years ago with gradually worsening course since that time.The patient noted no past surgery on the left hip(s).  Patient currently rates pain in the left hip at 8 out of 10 with activity. Patient has night pain, worsening of pain with activity and weight bearing, trendelenberg gait, pain that interfers with activities of daily living, pain with passive range of motion and crepitus. Patient has evidence of subchondral cysts, subchondral sclerosis, periarticular osteophytes and joint space narrowing by imaging studies. This condition presents safety issues increasing the risk of falls. This patient has had avascular necrosis of the hip, acetabular fracture, hip dysplasia.  There is no current active infection.  Patient Active Problem List   Diagnosis Date Noted  . Chronic cough 10/26/2013  . hyperlipidemia   . Anxiety   . Depressive disorder, not elsewhere classified   . Rhinitis   . Elevated blood sugar   . Atrophic vaginitis   . Postmenopausal    Past Medical History  Diagnosis Date  . Other and unspecified hyperlipidemia   . Anxiety   . Disorder of bone and cartilage, unspecified   . Depressive disorder, not elsewhere classified   . Rhinitis   . Elevated blood sugar   . Atrophic vaginitis   . Postmenopausal   . Cataract   . Arthritis   . Hypertension   . COPD (chronic obstructive pulmonary disease)   . Emphysema lung     Past Surgical History  Procedure Laterality Date  . Cataract  extraction w/ intraocular lens implant Bilateral 2005    [place]  . Appendectomy  1957    Prescriptions prior to admission  Medication Sig Dispense Refill  . acetaminophen (TYLENOL) 500 MG tablet Take 1,000 mg by mouth daily as needed for mild pain or moderate pain.      Marland Kitchen ALPRAZolam (XANAX) 0.25 MG tablet Take 0.25 mg by mouth at bedtime as needed for anxiety.      Marland Kitchen amLODipine (NORVASC) 5 MG tablet Take 5 mg by mouth daily.      Marland Kitchen aspirin 81 MG EC tablet Take 81 mg by mouth daily.        . Calcium Carbonate-Vitamin D (OSCAL 500/200 D-3 PO) Take 1 capsule by mouth 2 (two) times daily.       . Cholecalciferol (VITAMIN D) 2000 UNITS CAPS Take 1 capsule by mouth daily.        . citalopram (CELEXA) 20 MG tablet Take 1 tablet by mouth daily.      . fluticasone (FLONASE) 50 MCG/ACT nasal spray Place 1 spray into both nostrils daily.      . Fluticasone Furoate-Vilanterol (BREO ELLIPTA IN) Inhale 1 puff into the lungs daily.      Marland Kitchen lovastatin (MEVACOR) 40 MG tablet TAKE ONE half TABLET BY MOUTH ONE TIME DAILY      . Naproxen Sodium (ALEVE) 220 MG CAPS Take 1 capsule by mouth daily as needed.      . Omega-3 Fatty Acids (FISH OIL) 1200 MG CAPS Take 2 capsules by mouth daily.      Marland Kitchen  ciprofloxacin (CIPRO) 500 MG tablet Take 1 tablet (500 mg total) by mouth 2 (two) times daily.  14 tablet  0  . triamcinolone cream (KENALOG) 0.1 % Apply 1 application topically 2 (two) times daily.        Allergies  Allergen Reactions  . Cephalexin     Can't remember  . Chlordiazepoxide-Clidinium     Can't remember  . Clarithromycin     Can't remember  . Erymax [Erythromycin]     Can't remember  . Evista [Raloxifene Hydrochloride]     Numb feeling  . Penicillins     Can't remember  . Sertraline Hcl Other (See Comments)    Sweating and mouth irritation   . Sulfa Antibiotics Nausea And Vomiting  . Betadine [Povidone Iodine] Rash  . Hydrogen Peroxide Rash    History  Substance Use Topics  . Smoking status:  Never Smoker   . Smokeless tobacco: Never Used  . Alcohol Use: No    Family History  Problem Relation Age of Onset  . Arthritis Mother   . COPD Father   . Diabetes Father   . Aneurysm Sister      Review of Systems  All other systems reviewed and are negative.   Objective:  Physical Exam  Vital signs in last 24 hours: Temp:  [97.7 F (36.5 C)] 97.7 F (36.5 C) (09/16 0615) Pulse Rate:  [77] 77 (09/16 0615) Resp:  [18] 18 (09/16 0615) BP: (181)/(62) 181/62 mmHg (09/16 0615) SpO2:  [98 %] 98 % (09/16 0615) Weight:  [71.215 kg (157 lb)] 71.215 kg (157 lb) (09/16 0615)  Labs:   Estimated body mass index is 28.71 kg/(m^2) as calculated from the following:   Height as of 12/19/13: 5\' 2"  (1.575 m).   Weight as of this encounter: 71.215 kg (157 lb).   Imaging Review Plain radiographs demonstrate moderate degenerative joint disease of the left hip(s). The bone quality appears to be adequate for age and reported activity level.  Assessment/Plan:  End stage arthritis, left hip(s)  The patient history, physical examination, clinical judgement of the provider and imaging studies are consistent with end stage degenerative joint disease of the left hip(s) and total hip arthroplasty is deemed medically necessary. The treatment options including medical management, injection therapy, arthroscopy and arthroplasty were discussed at length. The risks and benefits of total hip arthroplasty were presented and reviewed. The risks due to aseptic loosening, infection, stiffness, dislocation/subluxation,  thromboembolic complications and other imponderables were discussed.  The patient acknowledged the explanation, agreed to proceed with the plan and consent was signed. Patient is being admitted for inpatient treatment for surgery, pain control, PT, OT, prophylactic antibiotics, VTE prophylaxis, progressive ambulation and ADL's and discharge planning.The patient is planning to be discharged home with  home health services

## 2013-12-27 NOTE — Transfer of Care (Signed)
Immediate Anesthesia Transfer of Care Note  Patient: [name] [name]  Procedure(s) Performed: Procedure(s): LEFT TOTAL HIP ARTHROPLASTY (Left)  Patient Location: PACU  Anesthesia Type:General  Level of Consciousness: awake, alert  and oriented  Airway & Oxygen Therapy: Patient Spontanous Breathing and Patient connected to nasal cannula oxygen  Post-op Assessment: Report given to PACU RN and Post -op Vital signs reviewed and stable  Post vital signs: Reviewed and stable  Complications: No apparent anesthesia complications

## 2013-12-27 NOTE — Anesthesia Procedure Notes (Signed)
Procedure Name: Intubation Date/Time: 12/27/2013 8:37 AM Performed by: Susa Loffler Pre-anesthesia Checklist: Patient identified, Timeout performed, Emergency Drugs available, Suction available and Patient being monitored Patient Re-evaluated:Patient Re-evaluated prior to inductionOxygen Delivery Method: Circle system utilized Preoxygenation: Pre-oxygenation with 100% oxygen Intubation Type: IV induction Ventilation: Mask ventilation without difficulty Laryngoscope Size: Mac and 3 Grade View: Grade II Tube type: Oral Tube size: 7.0 mm Number of attempts: 1 Airway Equipment and Method: Stylet Placement Confirmation: ETT inserted through vocal cords under direct vision,  positive ETCO2 and breath sounds checked- equal and bilateral Secured at: 21 cm Tube secured with: Tape Dental Injury: Teeth and Oropharynx as per pre-operative assessment

## 2013-12-27 NOTE — Anesthesia Preprocedure Evaluation (Addendum)
Anesthesia Evaluation  Patient identified by MRN, date of birth, ID band Patient awake    Reviewed: Allergy & Precautions, H&P , NPO status , Patient's Chart, lab work & pertinent test results  Airway Mallampati: II      Dental   Pulmonary COPD         Cardiovascular hypertension, Pt. on medications     Neuro/Psych PSYCHIATRIC DISORDERS Anxiety Depression    GI/Hepatic negative GI ROS, Neg liver ROS,   Endo/Other    Renal/GU negative Renal ROS     Musculoskeletal  (+) Arthritis -,   Abdominal   Peds  Hematology   Anesthesia Other Findings   Reproductive/Obstetrics                          Anesthesia Physical Anesthesia Plan  ASA: III  Anesthesia Plan: General   Post-op Pain Management:    Induction: Intravenous  Airway Management Planned: Oral ETT  Additional Equipment:   Intra-op Plan:   Post-operative Plan: Extubation in OR  Informed Consent: I have reviewed the patients History and Physical, chart, labs and discussed the procedure including the risks, benefits and alternatives for the proposed anesthesia with the patient or authorized representative who has indicated his/her understanding and acceptance.   Dental advisory given  Plan Discussed with: Anesthesiologist and Surgeon  Anesthesia Plan Comments:        Anesthesia Quick Evaluation

## 2013-12-28 ENCOUNTER — Encounter (HOSPITAL_COMMUNITY): Payer: Self-pay | Admitting: Orthopedic Surgery

## 2013-12-28 LAB — BASIC METABOLIC PANEL
Anion gap: 16 — ABNORMAL HIGH (ref 5–15)
BUN: 20 mg/dL (ref 6–23)
CALCIUM: 8.8 mg/dL (ref 8.4–10.5)
CO2: 22 mEq/L (ref 19–32)
CREATININE: 2.08 mg/dL — AB (ref 0.50–1.10)
Chloride: 100 mEq/L (ref 96–112)
GFR calc Af Amer: 25 mL/min — ABNORMAL LOW (ref >90)
GFR calc non Af Amer: 22 mL/min — ABNORMAL LOW (ref >90)
GLUCOSE: 154 mg/dL — AB (ref 70–99)
Potassium: 5.2 mEq/L (ref 3.7–5.3)
Sodium: 138 mEq/L (ref 137–147)

## 2013-12-28 MED ORDER — KETOROLAC TROMETHAMINE 15 MG/ML IJ SOLN
INTRAMUSCULAR | Status: AC
  Administered 2013-12-28: 7.5 mg
  Filled 2013-12-28: qty 1

## 2013-12-28 MED ORDER — LACTATED RINGERS IV SOLN
INTRAVENOUS | Status: DC
  Administered 2013-12-28 – 2013-12-29 (×2): via INTRAVENOUS

## 2013-12-28 MED ORDER — BISACODYL 10 MG RE SUPP
10.0000 mg | Freq: Once | RECTAL | Status: AC
  Administered 2013-12-28: 10 mg via RECTAL
  Filled 2013-12-28: qty 1

## 2013-12-28 NOTE — Progress Notes (Signed)
Physical Therapy Treatment Patient Details Name: [name] [name] MRN: [id number] DOB: Jun 24, 1931 Today's Date: 12/28/2013    History of Present Illness Patient is a 80 y/o female s/p L THA. Posterior hip precautions. PMH of HTN, COPD and arthritis.    PT Comments    Patient making good progress with ambulation. Still walking very "stiff". Will continue with current POC. Continue to recommend SNF for ongoing Physical Therapy.     Follow Up Recommendations        Equipment Recommendations  Rolling walker with 5" wheels    Recommendations for Other Services       Precautions / Restrictions Precautions Precautions: Posterior Hip;Fall Precaution Comments: Reviewed handout Restrictions Weight Bearing Restrictions: No LLE Weight Bearing: Weight bearing as tolerated    Mobility  Bed Mobility Overal bed mobility: Needs Assistance Bed Mobility: Supine to Sit     Supine to sit: Min assist;HOB elevated     General bed mobility comments: Patient up in recliner before and after session  Transfers Overall transfer level: Needs assistance Equipment used: Rolling walker (2 wheeled) Transfers: Sit to/from Stand Sit to Stand: Mod assist;+2 safety/equipment         General transfer comment: Mod A to power up into standing and cues for LLE and for correct hand placement  Ambulation/Gait Ambulation/Gait assistance: Min assist Ambulation Distance (Feet): 50 Feet Assistive device: Rolling walker (2 wheeled) Gait Pattern/deviations: Step-to pattern;Wide base of support;Decreased step length - left;Decreased stance time - right Gait velocity: decreased   General Gait Details: Patient cued for use of RW and gait sequence. Patient ambulated very abducted gait and "robotic". Cues to relax and smooth gait. Chair follow. One seated rest break   Stairs            Wheelchair Mobility    Modified Rankin (Stroke Patients Only)       Balance Overall balance assessment: Needs  assistance Sitting-balance support: Single extremity supported;Feet supported Sitting balance-Leahy Scale: Fair     Standing balance support: Bilateral upper extremity supported Standing balance-Leahy Scale: Zero                      Cognition Arousal/Alertness: Awake/alert Behavior During Therapy: WFL for tasks assessed/performed Overall Cognitive Status: Within Functional Limits for tasks assessed       Memory: Decreased recall of precautions              Exercises Total Joint Exercises Quad Sets: Both;10 reps;Seated Heel Slides: AAROM;Left;10 reps Hip ABduction/ADduction: AAROM;Left;10 reps Long Arc Quad: AROM;Left;10 reps    General Comments        Pertinent Vitals/Pain Pain Assessment: 0-10 Pain Score: 6  Pain Location: L hip Pain Descriptors / Indicators: Burning Pain Intervention(s): Limited activity within patient's tolerance    Home Living Family/patient expects to be discharged to:: Private residence Living Arrangements: Spouse/significant other Available Help at Discharge: Family;Available 24 hours/day Type of Home: House Home Access: Stairs to enter Entrance Stairs-Rails: Right Home Layout: One level Home Equipment: Cane - single point      Prior Function Level of Independence: Independent      Comments: Pt is (I) with ADLs, IADLs and works at [employer]. Does not drive. Was using SPC recently due to pain in left hip.   PT Goals (current goals can now be found in the care plan section) Acute Rehab PT Goals Patient Stated Goal: to go home Progress towards PT goals: Progressing toward goals    Frequency  7X/week  PT Plan Current plan remains appropriate    Co-evaluation             End of Session Equipment Utilized During Treatment: Gait belt;Oxygen Activity Tolerance: Patient tolerated treatment well;Patient limited by fatigue Patient left: in chair;with call bell/phone within reach     Time: 6578-4696 PT Time  Calculation (min): 31 min  Charges:  $Gait Training: 8-22 mins $Therapeutic Exercise: 8-22 mins                    G Codes:      Jacqualyn Posey 12/28/2013, 12:05 PM 12/28/2013 Jacqualyn Posey PTA [phone] pager [phone] office

## 2013-12-28 NOTE — Progress Notes (Signed)
Pt unable to void stated of having pressure to lower abdomen. Bladder scanned 373 cc in bladder, did in and out cath and received 500 cc.

## 2013-12-28 NOTE — Evaluation (Signed)
Occupational Therapy Evaluation and Discharge Patient Details Name: [name] [name] MRN: [id number] DOB: [birth year] Today's Date: 12/28/2013    History of Present Illness Patient is a 80 y/o female s/p L THA. Posterior hip precautions. PMH of HTN, COPD and arthritis.   Clinical Impression   This 80 yo female admitted with above presents to acute OT with increased pain, decreased mobility, decreased AROM of LLE, decreased balance, and posterior hip precautions all affecting the patient's ability to care for herself. She will benefit from continued OT at SNF to get to a Mod I/S level to return home with husband. Acute OT will sign off.    Follow Up Recommendations  SNF    Equipment Recommendations  3 in 1 bedside comode       Precautions / Restrictions Precautions Precautions: Posterior Hip;Fall Precaution Comments: Reviewed handout Restrictions Weight Bearing Restrictions: No LLE Weight Bearing: Weight bearing as tolerated      Mobility Bed Mobility Overal bed mobility: Needs Assistance Bed Mobility: Supine to Sit     Supine to sit: Min assist;HOB elevated        Transfers Overall transfer level: Needs assistance Equipment used: Rolling walker (2 wheeled) Transfers: Sit to/from Stand Sit to Stand: Max assist (due to posterior lean)              Balance Overall balance assessment: Needs assistance Sitting-balance support: Single extremity supported;Feet supported Sitting balance-Leahy Scale: Fair     Standing balance support: Bilateral upper extremity supported Standing balance-Leahy Scale: Zero                              ADL Overall ADL's : Needs assistance/impaired Eating/Feeding: Independent;Sitting   Grooming: Set up;Sitting   Upper Body Bathing: Set up;Sitting   Lower Body Bathing: Maximal assistance (with Max A sit<>stand)   Upper Body Dressing : Set up;Sitting   Lower Body Dressing: Total assistance (with Max A sit<>stand)    Toilet Transfer: Moderate assistance;Ambulation (bed>recliner 5 feet away)   Toileting- Clothing Manipulation and Hygiene: Total assistance (with Max A to maintain standing)                         Pertinent Vitals/Pain Pain Assessment: 0-10 Pain Score: 5  Pain Location: left hip Pain Descriptors / Indicators: Sore Pain Intervention(s): Limited activity within patient's tolerance     Hand Dominance Right   Extremity/Trunk Assessment Upper Extremity Assessment Upper Extremity Assessment: Overall WFL for tasks assessed           Communication Communication Communication: No difficulties   Cognition Arousal/Alertness: Awake/alert Behavior During Therapy: WFL for tasks assessed/performed Overall Cognitive Status: Within Functional Limits for tasks assessed (trouble with sequencing)       Memory: Decreased recall of precautions                        Home Living Family/patient expects to be discharged to:: Private residence Living Arrangements: Spouse/significant other Available Help at Discharge: Family;Available 24 hours/day Type of Home: House Home Access: Stairs to enter [employer] of Steps: 3 Entrance Stairs-Rails: Right Home Layout: One level               Home Equipment: Cane - single point          Prior Functioning/Environment Level of Independence: Independent        Comments: Pt is (I) with ADLs,  IADLs and works at [employer]. Does not drive. Was using SPC recently due to pain in left hip.    OT Diagnosis: Generalized weakness;Cognitive deficits;Acute pain   OT Problem List: Decreased strength;Decreased range of motion;Impaired balance (sitting and/or standing);Pain;Decreased knowledge of precautions;Decreased knowledge of use of DME or AE;Decreased cognition   OT Treatment/Interventions:      OT Goals(Current goals can be found in the care plan section) Acute Rehab OT Goals Patient Stated Goal: to go home  OT  Frequency:                End of Session Equipment Utilized During Treatment: Gait belt;Rolling walker Nurse Communication:  (NT: stand pivot; heavy mod A for sit<>stand and stand pivot)  Activity Tolerance: Patient limited by fatigue Patient left: in chair;with call bell/phone within reach;with family/visitor present   Time: 4503-8882 OT Time Calculation (min): 32 min Charges:  OT General Charges $OT Visit: 1 Procedure OT Evaluation $Initial OT Evaluation Tier I: 1 Procedure OT Treatments $Self Care/Home Management : 23-37 mins  Almon Register [phone] 12/28/2013, 9:34 AM

## 2013-12-28 NOTE — Progress Notes (Signed)
Patient ID: [name] [name], female   DOB: 1932-01-29, 80 y.o.   MRN: [id number] Postoperative day 1 left total hip arthroplasty. Patient is comfortable this morning in her room. Radiographs shows stable alignment of her left total hip arthroplasty. Plan for discharge to home on Friday or Saturday pending on progress with therapy.

## 2013-12-28 NOTE — Progress Notes (Signed)
12/28/13 Set up by MD office with [facility] for HHPT. PT and OT recommending SNF. Referral made to CSW. Clotee Schlicker with [facility] notified of change in d/c plan. CM will continue to follow. Fuller Plan RN, BSN, CCM

## 2013-12-29 ENCOUNTER — Inpatient Hospital Stay
Admission: RE | Admit: 2013-12-29 | Discharge: 2014-01-13 | Disposition: A | Discharge status: Home / Self Care | Payer: Medicare Other | Source: Ambulatory Visit | Attending: Internal Medicine | Admitting: Internal Medicine

## 2013-12-29 DIAGNOSIS — R0902 Hypoxemia: Secondary | ICD-10-CM

## 2013-12-29 DIAGNOSIS — R609 Edema, unspecified: Principal | ICD-10-CM

## 2013-12-29 DIAGNOSIS — R062 Wheezing: Secondary | ICD-10-CM

## 2013-12-29 DIAGNOSIS — M1610 Unilateral primary osteoarthritis, unspecified hip: Secondary | ICD-10-CM | POA: Diagnosis not present

## 2013-12-29 DIAGNOSIS — M169 Osteoarthritis of hip, unspecified: Secondary | ICD-10-CM | POA: Diagnosis not present

## 2013-12-29 LAB — BASIC METABOLIC PANEL
Anion gap: 10 (ref 5–15)
BUN: 20 mg/dL (ref 6–23)
CO2: 26 mEq/L (ref 19–32)
CREATININE: 1.07 mg/dL (ref 0.50–1.10)
Calcium: 8.5 mg/dL (ref 8.4–10.5)
Chloride: 97 mEq/L (ref 96–112)
GFR calc Af Amer: 56 mL/min — ABNORMAL LOW (ref >90)
GFR calc non Af Amer: 48 mL/min — ABNORMAL LOW (ref >90)
GLUCOSE: 133 mg/dL — AB (ref 70–99)
Potassium: 4.4 mEq/L (ref 3.7–5.3)
Sodium: 133 mEq/L — ABNORMAL LOW (ref 137–147)

## 2013-12-29 MED ORDER — HYDROCODONE-ACETAMINOPHEN 5-325 MG PO TABS: 1.0000 | ORAL_TABLET | Freq: Four times a day (QID) | ORAL | Status: DC | PRN

## 2013-12-29 MED ORDER — ASPIRIN EC 325 MG PO TBEC: 325.0000 mg | DELAYED_RELEASE_TABLET | Freq: Every day | ORAL | Status: DC

## 2013-12-29 NOTE — Progress Notes (Signed)
Patient unable to void after two in and out caths on 9/17. Patient was bladder scanned and showed 434. Foley catheter placed for urinary retention. Will continue to monitor

## 2013-12-29 NOTE — Discharge Instructions (Signed)
Posterior total hip her cautions. Weightbearing as tolerated.

## 2013-12-29 NOTE — Progress Notes (Signed)
Patient ID: [name] [name], female   DOB: 1932/01/18, 80 y.o.   MRN: [id number] Postoperative day 2 status post total hip arthroplasty. Patient has had problems with urinary retention and a Foley catheter was replaced. Patient is eating and drinking well and will discontinue IV fluids. I felt to was completed. Plan for discharge to skilled nursing at this time as per recommendations from occupational therapy. Discharge summary completed.

## 2013-12-29 NOTE — Clinical Social Work Psychosocial (Signed)
Clinical Social Work Department BRIEF PSYCHOSOCIAL ASSESSMENT 12/29/2013  Patient:  [name] [name], [name] [name]     Account Number:  [id number]     Admit date:  12/27/2013  Clinical Social Worker:  Wylene Men  Date/Time:  12/28/2013 11:00 AM  Referred by:  Physician  Date Referred:  12/28/2013 Referred for  SNF Placement  Psychosocial assessment   Other Referral:   none   Interview type:  Patient Other interview type:   none    PSYCHOSOCIAL DATA Living Status:  HUSBAND Admitted from facility:   Level of care:   Primary support name:  Glendell Docker Primary support relationship to patient:  SPOUSE Degree of support available:   adequate    CURRENT CONCERNS Current Concerns  Post-Acute Placement   Other Concerns:   none    SOCIAL WORK ASSESSMENT / PLAN CSW assessed pt at bedside.  Pt was alert and oriented x4 during the course of the assessment. Husband was present per pt request. CSW introduced self and CSW role.  PT has recommended SNF/STR at time of dc.  Pt and husband are agreeable to SNF placement.  Pt first SNF choice is [facility]  Pt expressed hopefulness regarding her prognosis and life after healing/STR.   Assessment/plan status:  Psychosocial Support/Ongoing Assessment of Needs Other assessment/ plan:   FL2  PASARR   Information/referral to community resources:   SNF    PATIENT'S/FAMILY'S RESPONSE TO PLAN OF CARE: Pt and husband are both agreeable to  SNF and appreciate CSW assistance and support.         [name], [place] [phone]  Psychiatric & Orthopedics (5N 1-16) Clinical Social Worker

## 2013-12-29 NOTE — Progress Notes (Signed)
Discharged to [facility] via [facility].

## 2013-12-29 NOTE — Clinical Social Work Note (Addendum)
Pt to dc to:  [facility], SNF RN to call report to: [phone] Transportation: [facility] scheduled for 1:30-2:30pm  RN updated.  Pt has been updated by CSW and agreeable to dc plan.  CSW has obtained insurance authorization for pt.  [phone] RVB. [place] aware. DC summary was sent to [facility].  [name], [place] [phone]  Psychiatric & Orthopedics (5N 1-16) Clinical Social Worker

## 2013-12-29 NOTE — Progress Notes (Signed)
Physical Therapy Treatment Patient Details Name: NAYELIZ HIPP MRN: [id number] DOB: 1931-05-09 Today's Date: 12/29/2013    History of Present Illness Patient is a 80 y/o female s/p L THA. Posterior hip precautions. PMH of HTN, COPD and arthritis.    PT Comments    Patient progressing slowly with ambulation. Patient with some shakiness and stated that she was anxious. Patient with uncontrolled BM with ambulation. Patient having difficulty sequencing with gait. Patient is now planning to discharge to [facility] for continued rehab prior to DC home.   Follow Up Recommendations  SNF     Equipment Recommendations  Rolling walker with 5" wheels    Recommendations for Other Services       Precautions / Restrictions Precautions Precautions: Posterior Hip;Fall Precaution Comments: Patient having difficult time remembering precautions and maintaining Restrictions Weight Bearing Restrictions: Yes LLE Weight Bearing: Weight bearing as tolerated    Mobility  Bed Mobility Overal bed mobility: Needs Assistance       Supine to sit: Mod assist;HOB elevated     General bed mobility comments: Mod A for trunk control and LE positioning out of bed. Cues for positioning. use of chuck pad to assist with scooting  Transfers Overall transfer level: Needs assistance Equipment used: Rolling walker (2 wheeled)   Sit to Stand: Mod assist         General transfer comment: Mod A to power up into standing and cues for LLE and for correct hand placement  Ambulation/Gait Ambulation/Gait assistance: Min assist Ambulation Distance (Feet): 55 Feet Assistive device: Rolling walker (2 wheeled) Gait Pattern/deviations: Step-through pattern;Decreased stride length Gait velocity: decreased   General Gait Details: Patient continues to walk with wide BOS depsite cues and A. Patient having diffcult time with sequency and walking with shuffle gait   Stairs            Wheelchair Mobility     Modified Rankin (Stroke Patients Only)       Balance                                    Cognition Arousal/Alertness: Awake/alert Behavior During Therapy: WFL for tasks assessed/performed Overall Cognitive Status: Within Functional Limits for tasks assessed       Memory: Decreased recall of precautions              Exercises Total Joint Exercises Quad Sets: Both;10 reps;Seated Heel Slides: AAROM;Left;10 reps Hip ABduction/ADduction: AAROM;Left;10 reps Long Arc Quad: AROM;Left;10 reps    General Comments        Pertinent Vitals/Pain Pain Score: 4  Pain Location: L hip Pain Descriptors / Indicators: Burning Pain Intervention(s): Monitored during session    Home Living                      Prior Function            PT Goals (current goals can now be found in the care plan section) Progress towards PT goals: Progressing toward goals    Frequency  7X/week    PT Plan Current plan remains appropriate    Co-evaluation             End of Session Equipment Utilized During Treatment: Gait belt Activity Tolerance: Patient tolerated treatment well Patient left: in chair;with call bell/phone within reach     Time: 1101-1130 PT Time Calculation (min): 29 min  Charges:  $Gait Training:  8-22 mins $Therapeutic Exercise: 8-22 mins                    G Codes:      Jacqualyn Posey 12/29/2013, 11:58 AM 12/29/2013 Jacqualyn Posey PTA [phone] pager [phone] office

## 2013-12-29 NOTE — Clinical Social Work Note (Signed)
CSW awaiting [employer] authorization for SNF placement.  [name], [place] [phone]  Psychiatric & Orthopedics (5N 1-16) Clinical Social Worker

## 2013-12-29 NOTE — Discharge Summary (Signed)
Physician Discharge Summary  Patient ID: [name] [name] MRN: [id number] DOB/AGE: [birth year] 78 y.o.  Admit date: 12/27/2013 Discharge date: 12/29/2013  Admission Diagnoses: Osteoarthritis left hip Discharge Diagnoses:  Active Problems:   S/P total hip arthroplasty   Discharged Condition: stable  Hospital Course: Patient's hospital course was essentially unremarkable. Patient had difficulty progressing with physical therapy and initial plans for discharge to home were changed to discharge to skilled nursing.  Consults: None  Significant Diagnostic Studies: labs: Routine labs  Treatments: surgery: See operative note  Discharge Exam: Blood pressure 127/50, pulse 93, temperature 98.5 F (36.9 C), temperature source Oral, resp. rate 16, height 5\' 2"  (1.575 m), weight 71.668 kg (158 lb), SpO2 94.00%. Incision/Wound: dressing clean dry and intact  Disposition:  to skilled nursing facility     Medication List    ASK your doctor about these medications       acetaminophen 500 MG tablet  Commonly known as:  TYLENOL  Take 1,000 mg by mouth daily as needed for mild pain or moderate pain.     ALEVE 220 MG Caps  Generic drug:  Naproxen Sodium  Take 1 capsule by mouth daily as needed.     ALPRAZolam 0.25 MG tablet  Commonly known as:  XANAX  Take 0.25 mg by mouth at bedtime as needed for anxiety.     amLODipine 5 MG tablet  Commonly known as:  NORVASC  Take 5 mg by mouth daily.     aspirin 81 MG EC tablet  Take 81 mg by mouth daily.     BREO ELLIPTA IN  Inhale 1 puff into the lungs daily.     ciprofloxacin 500 MG tablet  Commonly known as:  CIPRO  Take 1 tablet (500 mg total) by mouth 2 (two) times daily.     citalopram 20 MG tablet  Commonly known as:  CELEXA  Take 1 tablet by mouth daily.     Fish Oil 1200 MG Caps  Take 2 capsules by mouth daily.     fluticasone 50 MCG/ACT nasal spray  Commonly known as:  FLONASE  Place 1 spray into both nostrils daily.     lovastatin 40 MG tablet  Commonly known as:  MEVACOR  TAKE ONE half TABLET BY MOUTH ONE TIME DAILY     OSCAL 500/200 D-3 PO  Take 1 capsule by mouth 2 (two) times daily.     triamcinolone cream 0.1 %  Commonly known as:  KENALOG  Apply 1 application topically 2 (two) times daily.     Vitamin D 2000 UNITS Caps  Take 1 capsule by mouth daily.           Follow-up Information   Follow up with [name] Biggar V, MD In 2 weeks.   Specialty:  Orthopedic Surgery   Contact information:   [place] [address] [phone]       Signed: Newt Minion 12/29/2013, 6:32 AM

## 2013-12-29 NOTE — Progress Notes (Signed)
Report given to [facility] center, tabitha LPN, receiving nurse.

## 2013-12-29 NOTE — Clinical Social Work Placement (Addendum)
Clinical Social Work Department CLINICAL SOCIAL WORK PLACEMENT NOTE 12/29/2013  Patient:  [name] [name], [name] [name]  Account Number:  [id number] Admit date:  12/27/2013  Clinical Social Worker:  Wylene Men  Date/time:  12/28/2013 11:00 AM  Clinical Social Work is seeking post-discharge placement for this patient at the following level of care:   SKILLED NURSING   (*CSW will update this form in Epic as items are completed)   12/28/2013  Patient/family provided with [place] Department of Clinical Social Work's list of facilities offering this level of care within the geographic area requested by the patient (or if unable, by the patient's family).  12/28/2013  Patient/family informed of their freedom to choose among providers that offer the needed level of care, that participate in Medicare, Medicaid or managed care program needed by the patient, have an available bed and are willing to accept the patient.  12/28/2013  Patient/family informed of [facility]' ownership interest in [facility], as well as of the fact that they are under no obligation to receive care at this facility.  PASARR submitted to [employer] on 12/28/2013 PASARR number received on 12/28/2013  FL2 transmitted to all facilities in geographic area requested by pt/family on  12/28/2013 FL2 transmitted to all facilities within larger geographic area on 12/28/2013  Patient informed that his/her managed care company has contracts with or will negotiate with  certain facilities, including the following:     Patient/family informed of bed offers received:  12/29/2013 Patient chooses bed at [facility] Physician recommends and patient chooses bed at  n/a  Patient to be transferred to  [facility] on  12/29/2013 Patient to be transferred to facility by PTAR Patient and family notified of transfer on 12/29/2013 Name of family member notified:  Pt is alert and oriented and does not request that CSW contact family at  this time. CSW updated pt and pt is agreeable to dc plan and scheduled transportation time of 2pm.  The following physician request were entered in Epic:   Additional Comments:   [name], [place] [phone]  Psychiatric & Orthopedics (5N 1-16) Clinical Social Worker

## 2013-12-29 NOTE — Progress Notes (Signed)
CARE MANAGEMENT NOTE 12/29/2013  Patient:  [name] [name], [name] [name]   Account Number:  [id number]  Date Initiated:  12/28/2013  Documentation initiated by:  [facility]  Subjective/Objective Assessment:   admited s/p left THA     Action/Plan:   PT/OT evals- recommended SNF   Anticipated DC Date:  12/29/2013   Anticipated DC Plan:  SKILLED NURSING FACILITY  In-house referral  Clinical Social Worker      DC Planning Services  CM consult      Choice offered to / List presented to:             Status of service:  Completed, signed off Medicare Important Message given?   (If response is "NO", the following Medicare IM given date fields will be blank) Date Medicare IM given:   Medicare IM given by:   Date Additional Medicare IM given:   Additional Medicare IM given by:    Discharge Disposition:  [place]  Per UR Regulation:    If discussed at Long Length of Stay Meetings, dates discussed:    Comments:  12/28/13 Set up by MD office with [facility] for HHPT. PT and OT recommending SNF. Referral made to CSW. Kilyn Maragh with [facility] notified of change in d/c plan. CM will continue to follow. Fuller Plan RN, BSN, CCM

## 2014-01-01 ENCOUNTER — Other Ambulatory Visit: Payer: Self-pay | Admitting: *Deleted

## 2014-01-01 ENCOUNTER — Non-Acute Institutional Stay (SKILLED_NURSING_FACILITY): Payer: Medicare Other | Admitting: Internal Medicine

## 2014-01-01 ENCOUNTER — Ambulatory Visit (HOSPITAL_COMMUNITY)
Admit: 2014-01-01 | Discharge: 2014-01-01 | Disposition: A | Discharge status: Home / Self Care | Payer: Medicare Other | Source: Ambulatory Visit | Attending: Internal Medicine | Admitting: Internal Medicine

## 2014-01-01 ENCOUNTER — Telehealth: Payer: Self-pay | Admitting: Family Medicine

## 2014-01-01 DIAGNOSIS — J449 Chronic obstructive pulmonary disease, unspecified: Secondary | ICD-10-CM

## 2014-01-01 DIAGNOSIS — Z96649 Presence of unspecified artificial hip joint: Secondary | ICD-10-CM

## 2014-01-01 DIAGNOSIS — M7989 Other specified soft tissue disorders: Secondary | ICD-10-CM | POA: Diagnosis not present

## 2014-01-01 DIAGNOSIS — Z96642 Presence of left artificial hip joint: Secondary | ICD-10-CM

## 2014-01-01 DIAGNOSIS — M79609 Pain in unspecified limb: Secondary | ICD-10-CM | POA: Insufficient documentation

## 2014-01-01 DIAGNOSIS — R609 Edema, unspecified: Secondary | ICD-10-CM

## 2014-01-01 MED ORDER — ALPRAZOLAM 0.25 MG PO TABS: ORAL_TABLET | ORAL | Status: DC

## 2014-01-01 MED ORDER — TRAMADOL HCL 50 MG PO TABS: ORAL_TABLET | ORAL | Status: DC

## 2014-01-01 NOTE — Progress Notes (Signed)
Patient ID: [name] [name], female   DOB: [birth year], 80 y.o.   MRN: [id number]  Facility; [facility] Chief complaint; admission to SNF post admit to [facility] from 9/16 to 9/18  History; this is a patient who was admitted electively for total hip replacement secondary to osteoarthritis, avascular necrosis and acetabular fracture of the left hip there is no real mention of postoperative issues although she comes here with the a Foley catheter in place. She also is a  significant degree of left leg swelling.  Past Medical History  Diagnosis Date  . Other and unspecified hyperlipidemia   . Anxiety   . Disorder of bone and cartilage, unspecified   . Depressive disorder, not elsewhere classified   . Rhinitis   . Elevated blood sugar   . Atrophic vaginitis   . Postmenopausal   . Cataract   . Arthritis   . Hypertension   . COPD (chronic obstructive pulmonary disease)   . Emphysema lung    Past Surgical History  Procedure Laterality Date  . Cataract extraction w/ intraocular lens implant Bilateral 2005    [facility]  . Appendectomy  1957  . Total hip arthroplasty Left 12/27/2013    Procedure: LEFT TOTAL HIP ARTHROPLASTY;  Surgeon: Newt Minion, MD;  Location: [place];  Service: Orthopedics;  Laterality: Left;   Current Outpatient Prescriptions on File Prior to Visit  Medication Sig Dispense Refill  . acetaminophen (TYLENOL) 500 MG tablet Take 1,000 mg by mouth daily as needed for mild pain or moderate pain.      Marland Kitchen ALPRAZolam (XANAX) 0.25 MG tablet Take one tablet by mouth twice daily for anxiety  60 tablet  5  . amLODipine (NORVASC) 5 MG tablet Take 5 mg by mouth daily.      Marland Kitchen aspirin 81 MG EC tablet Take 81 mg by mouth daily.        Marland Kitchen aspirin EC 325 MG tablet Take 1 tablet (325 mg total) by mouth daily.  30 tablet  0  . Calcium Carbonate-Vitamin D (OSCAL 500/200 D-3 PO) Take 1 capsule by mouth 2 (two) times daily.       . Cholecalciferol (VITAMIN D) 2000 UNITS CAPS Take 1 capsule by mouth  daily.        . ciprofloxacin (CIPRO) 500 MG tablet Take 1 tablet (500 mg total) by mouth 2 (two) times daily.  14 tablet  0  . citalopram (CELEXA) 20 MG tablet Take 1 tablet by mouth daily.      . fluticasone (FLONASE) 50 MCG/ACT nasal spray Place 1 spray into both nostrils daily.      . Fluticasone Furoate-Vilanterol (BREO ELLIPTA IN) Inhale 1 puff into the lungs daily.      Marland Kitchen HYDROcodone-acetaminophen (NORCO) 5-325 MG per tablet Take 1 tablet by mouth every 6 (six) hours as needed.  30 tablet  0  . lovastatin (MEVACOR) 40 MG tablet TAKE ONE half TABLET BY MOUTH ONE TIME DAILY      . Naproxen Sodium (ALEVE) 220 MG CAPS Take 1 capsule by mouth daily as needed.      . Omega-3 Fatty Acids (FISH OIL) 1200 MG CAPS Take 2 capsules by mouth daily.      . traMADol (ULTRAM) 50 MG tablet Take one tablet by mouth twice daily as needed for pain  60 tablet  5  . triamcinolone cream (KENALOG) 0.1 % Apply 1 application topically 2 (two) times daily.         Social; patient lives  with her husband. Independent woman prior to the surgery actually still working at [employer]. Was not on oxygen prior to admission  reports that she has never smoked. She has never used smokeless tobacco. She reports that she does not drink alcohol or use illicit drugs. Extensive secondhand smoke exposure.  family history includes Aneurysm in her sister; Arthritis in her mother; COPD in her father; Diabetes in her father.  Review of systems Respiratory; chronic cough unchanged listed as having COPD/emphysema. Currently on oxygen Cardiac; no chest pain GI abdominal distention GU voiding frequency up to 3 or 4 times at night even prior to the hospitalization  Physical exam Gen. patient appears somewhat frail very emotional Vitals; O2 sat 93% on room air respirations 20 pulse rate 78 Respiratory shallow air entry bilaterally but no crackles or wheezes no accessory muscle use work of breathing is normal Cardiac heart sounds are normal  no murmurs no elevation in jugular venous pressure Abdomen distended bowel sounds reduced no liver no spleen no tenderness GU Foley catheter in place Extremities left hip incision is clean and well approximated some scant discharge. There is edema well down into her calf with pitting edema and warmth.   Impression/plan #1 status post left hip replacement surgery #2 postoperative urinary retention although I don't actually see a amount that she was catheterized for. Urine culture grew 25-50,000 Escherichia coli and beta hemolytic strep discharged on Cipro. This is not likely to be the cause of this #3 history of COPD but not on chronic oxygen. Her O2 sat is only 93% on 2 L; her exam is otherwise clear this may represent postop atelectasis. She will need regular nebulizers #4 candidal rash in the left groin #5 situational depression although I see the patient has been on antidepressants in the past #6 significant thickened swelling of the left leg. She'll need a duplex ultrasound she is on aspirin 325+81

## 2014-01-01 NOTE — Telephone Encounter (Signed)
[facility] 

## 2014-01-02 ENCOUNTER — Non-Acute Institutional Stay (SKILLED_NURSING_FACILITY): Payer: Medicare Other | Admitting: Internal Medicine

## 2014-01-02 ENCOUNTER — Encounter: Payer: Self-pay | Admitting: Internal Medicine

## 2014-01-02 DIAGNOSIS — D649 Anemia, unspecified: Secondary | ICD-10-CM

## 2014-01-02 DIAGNOSIS — Z96649 Presence of unspecified artificial hip joint: Secondary | ICD-10-CM

## 2014-01-02 DIAGNOSIS — R0902 Hypoxemia: Secondary | ICD-10-CM

## 2014-01-02 DIAGNOSIS — R609 Edema, unspecified: Secondary | ICD-10-CM

## 2014-01-02 NOTE — Telephone Encounter (Signed)
Nervous all the time room 101  daughter says all she has asked for is Dr Laurance Flatten.  Daughter wants to know if there is anyway [facility] can call  Or come by

## 2014-01-02 NOTE — Telephone Encounter (Signed)
Please try to call the [facility] sometime during the day tomorrow and I will speak with Ms. Lavone Neri

## 2014-01-02 NOTE — Progress Notes (Signed)
Patient ID: [name] [name], female   DOB: 1931-09-13, 80 y.o.   MRN: [id number]   This is an acute visit.  Level of care skilled.  Facility [facility].  Chief complaint-acute visit followup left leg edema-hypoxia.  History of present illness  Patient is a pleasant 80 year old female who was admitted electively for a total hip replacement on the left secondary to severe osteoarthritis and avascular necrosis and fracture of the left hip.  Dr. Dellia Nims did see her yesterday and ordered a venous Doppler secondary to significant left leg edema-this has come back negative.  Patient also was on oxygen-is apparently is relatively new-there were orders to try to titrate her off this-she was also given orders for albuterol nebulizers every 4 hours when necessary as well as incentive spirometry.  However attempts to date due we are off Vioxx she didn't have resulted in O2 saturations dipping into the ED stash.  She does not complaining of any acute shortness of breath--does have a history of a chronic cough .  She does have a listed history of COPD   r family medical social history as been reviewed per admission note of 01/01/2014.  Medications have been reviewed per MAR.  Review of systems.  In general does not complaining of fever or chills.  Respiratory does complain of a chronic cough does not complaining of acute shortness of breath however.  Cardiac does not complaining of chest pain again she has significant left lower extremity edema.  GI does not complaining of abdominal discomfort does complain of some gas discomfort at times does not complaining of nausea or vomiting.  Muscle skeletal not complaining of acute joint pain at this time.  Neurologic does not complain of dizziness or headache or syncopal type episodes or numbness.  Physical exam.  Dr. Greggory Stallion 96-respirations 20-blood pressure 117/69-O2 saturation 95% on oxygen.  In general this is a pleasant elderly female in no  distress resting comfortably in bed.  Her skin is warm and dry.  Chest is clear to auscultation except for a slight wheeze in the anterior field.  There is no labored breathing.  Heart is regular rate and rhythm with an occasional irregular beat-continues to have edema of her left leg extending from her thigh into her calf this is somewhat pitting.  Muscle skeletal-as noted above otherwise does move her other extremities x3 I do not note any deformities.  Neurologic is grossly intact cranial nerves intact no lateralizing findings her speech is clear.  Psych she is alert and oriented x3 pleasant and. Appropriate  Labs  12/30/2013.  WBC 10.0-hemoglobin 10.3-platelets 260.  Sodium 137-potassium 4.6-CO2 level XXVII-BUN 10-creatinine 0.66.  AST 80-ALT 37-albumin 2.9.  Uric acid 3.1.  TSH-0.516.  Assessment plan.  #1-left leg edema-Doppler was negative for DVT-he is on anticoagulation with aspirin-at this point monitor encourage leg elevation.  #2-history of hypoxia COPD-we'll start duo nebs routinely every 6 hour x48 hours s-continue spirometry-and monitor-also will obtain a chest x-ray  #3-history of elevated liver function tests-at this point appear mildly elevated-this appears to be new I did review her previous labs and liver function tests appeared to be within normal limits-I do note she is on a statin as well as Aleve-at this point will monitor and recheck this later this week.  #4-anemia-it appears previous hemoglobin at one point was over 13  this was sometime before surgery -I suspect her hemoglobin is stable  Considering she is post op-- but would like to recheck this as well later in  the week  Addendum-I see the patient had complained of some mouth discomfort-I did evaluate her mouth I do not note any significant ulcers  drainage or  exudate -we'll treat with Magic mouthwash when necessary and monitor  [id number]    .

## 2014-01-02 NOTE — Telephone Encounter (Signed)
lmtcb- jhb -- ??????? 

## 2014-01-03 ENCOUNTER — Ambulatory Visit (HOSPITAL_COMMUNITY)
Admit: 2014-01-03 | Discharge: 2014-01-03 | Disposition: A | Discharge status: Skilled Nursing Facility | Payer: Medicare Other | Source: Skilled Nursing Facility | Attending: Internal Medicine | Admitting: Internal Medicine

## 2014-01-03 DIAGNOSIS — R0902 Hypoxemia: Secondary | ICD-10-CM | POA: Insufficient documentation

## 2014-01-03 DIAGNOSIS — J9819 Other pulmonary collapse: Secondary | ICD-10-CM | POA: Diagnosis not present

## 2014-01-03 DIAGNOSIS — R062 Wheezing: Secondary | ICD-10-CM | POA: Diagnosis present

## 2014-01-03 NOTE — Telephone Encounter (Signed)
[facility] called pt this am

## 2014-01-13 ENCOUNTER — Non-Acute Institutional Stay (SKILLED_NURSING_FACILITY): Payer: Medicare Other | Admitting: Internal Medicine

## 2014-01-13 ENCOUNTER — Encounter: Payer: Self-pay | Admitting: Internal Medicine

## 2014-01-13 DIAGNOSIS — Z9660 Presence of unspecified orthopedic joint implant: Secondary | ICD-10-CM

## 2014-01-13 DIAGNOSIS — Z96649 Presence of unspecified artificial hip joint: Secondary | ICD-10-CM

## 2014-01-13 DIAGNOSIS — R0902 Hypoxemia: Secondary | ICD-10-CM

## 2014-01-13 DIAGNOSIS — R748 Abnormal levels of other serum enzymes: Secondary | ICD-10-CM

## 2014-01-13 DIAGNOSIS — E785 Hyperlipidemia, unspecified: Secondary | ICD-10-CM

## 2014-01-13 NOTE — Progress Notes (Signed)
Patient ID: [name] [name], female   DOB: 1932/03/05, 80 y.o.   MRN: [id number]   this is a discharge note.  Level of care skilled.  Facility [facility].   Chief complaint; Discharge note    HPI--; this is a patient who was admitted electively for total hip replacement secondary to osteoarthritis, avascular necrosis and acetabular fracture of the left hip there is no real mention of postoperative issues although she comes here with the a Foley catheter in place--this has since been removed and apparently she is voiding fairly unremarkably. . She also hada significant degree of left leg swelling-a venous Doppler was negative for any DVT the swelling has improved.  Initially when she came here she had some elevated liver function tests-this was a mild elevation and subsequent liver function tests have normalized.  Of note she is on a statin again the enzymes appear to have normalized.  Patient has progressed somewhat therapy he is using a rolling walker at times however still is significantly weak and largely ambulates in a wheelchair.  She will need fairly extensive PT and OT.  In regards to DVT prophylaxis she continues on Xarelto.  She does have a history of COPD usually when she came here she was oxygen dependent but now has been off this for several days and tolerating it well.  Today she has no acute complaints denies any shortness of breath cough abdominal pain chest pain-will be going home with her husband who is extremely supportive.  -.  Past Medical History   Diagnosis  Date   .  Other and unspecified hyperlipidemia    .  Anxiety    .  Disorder of bone and cartilage, unspecified    .  Depressive disorder, not elsewhere classified    .  Rhinitis    .  Elevated blood sugar    .  Atrophic vaginitis    .  Postmenopausal    .  Cataract    .  Arthritis    .  Hypertension    .  COPD (chronic obstructive pulmonary disease)    .  Emphysema lung     Past Surgical History     Procedure  Laterality  Date   .  Cataract extraction w/ intraocular lens implant  Bilateral  2005     [place]   .  Appendectomy   1957   .  Total hip arthroplasty  Left  12/27/2013     Procedure: LEFT TOTAL HIP ARTHROPLASTY; Surgeon: Newt Minion, MD; Location: [place]; Service: Orthopedics; Laterality: Left;   Medications reviewed per [facility]                                                                                                               Social; patient lives with her husband. Independent woman prior to the surgery actually still working at [employer]. Was not on oxygen prior to admission   reports that she has never smoked. She has never used smokeless tobacco. She reports that she does not drink  alcohol or use illicit drugs. Extensive secondhand smoke exposure .  family history includes Aneurysm in her sister; Arthritis in her mother; COPD in her father; Diabetes in her father .  Review of systems Gen. no complaints of fever chills.  Skin does not complaining any rashes or itching.  Head ears eyes nose mouth and throat-does not complaining of any sore throat or visual changes initially treated for thrush this appears to have resolved   Respiratory; chronic cough unchanged listed as having COPD/emphysema. Currently off oxygen   Cardiac; no chest pain continues with some left lower extremity edema however this is improved   GI abdominal distention abdominal pain nausea or vomiting   GU DOES not complain of dysuria-  Muscle skeletal does not complaining of pain again just have history of hip repair and will need extensive therapy.  Neurologic does not complaining of dizziness or headache.  Psych appears to be in better spirits than on admission-   Physical exam  Temperature97.5 pulse 90 respirations 18 blood pressure 120/58-O2 saturation in the 90s on room air In general this is somewhat frail elderly female in no distress sitting comfortably in her  wheelchair.  Her skin is warm and dry as per nursing surgical site is benign without drainage bleeding on the left hip there is well healed crusting  Respiratory shallow air entry bilaterally but no crackles or wheezes no accessory muscle use work of breathing is normal  Cardiac heart sounds are normal no murmurs no elevation in jugular venous pressure  Abdomen distended bowel sounds positiveno tenderness    Extremities  moces all extremities x4-is still largely ambulating in wheelchair-has limited range of motion of her left extremity secondary to recent cerebrovascular still continues to be some edema I would say 1+ with slightly reduced pedal pulse capillary refill is intact touch sensation remains intact edema is nontender nonerythematous.  Past neurologic is grossly intact no lateralizing findings her speech is clear.  Psych she is alert and oriented x3 pleasant and appropriate.  01/03/2014.  WBC 8.1 hemoglobin 11.1 platelets 342.  Sodium 140 potassium 4 BUN 14 creatinine 0.67.  Liver function tests within normal limits except albumin of 2.8    Impression/plan   #1 status post left hip replacement surgery --she used to will need PT and OT secondary to significant ambulation difficulties-continues on  Xarelto for anticoagulation --she is now weightbearing as tolerated and followed by orthopedics #2 postoperative urinary retention--this appears to have resolved-urine culture was not very remarkable   #3 history of COPD  With hypoxia-- but not on chronic oxygen--. initially required oxygen-but now he is maintaining quite well without any oxygen-which is her baseline-she has had when necessary nebulizers--she continues on Breo-ellipta as well as Flonase.     #4-at first there was some concern about situational depression but this appears to have stabilized-appears to be much better spirits obviously looking forward to going home.  #5-hypertension-this appears stable recent blood  pressures include 109/55-144/59-I do not see consistent elevation--she is on Norvasc.  #6-hyperlipidemia- is on a statin will defer aggressive workup of this to her primary care provider.  #7-pain management this appears to be stable she has tramadol when necessary twice a day.  #8-some history of anxiety she is on that next twice a day this showed stabilization during her stay here as well.  [id number] note greater than 30 minutes spent on this discharge summary

## 2014-01-15 ENCOUNTER — Telehealth: Payer: Self-pay

## 2014-01-15 NOTE — Telephone Encounter (Signed)
Dr Sharol Given do a face to face visit last week and he can place this order without seeing the patient back. If we where to order- we would have to do our own face to face for Medicare purposes.  Pt husband aware and will get Dr Sharol Given to do this.

## 2014-01-15 NOTE — Telephone Encounter (Signed)
Please get this appointment as soon as possible

## 2014-01-15 NOTE — Telephone Encounter (Signed)
Therapy questions

## 2014-01-16 ENCOUNTER — Telehealth: Payer: Self-pay | Admitting: Family Medicine

## 2014-01-16 ENCOUNTER — Ambulatory Visit: Payer: Medicare Other | Attending: Orthopedic Surgery | Admitting: Physical Therapy

## 2014-01-16 DIAGNOSIS — M25559 Pain in unspecified hip: Secondary | ICD-10-CM | POA: Insufficient documentation

## 2014-01-16 DIAGNOSIS — Z96642 Presence of left artificial hip joint: Secondary | ICD-10-CM | POA: Diagnosis not present

## 2014-01-16 DIAGNOSIS — R5381 Other malaise: Secondary | ICD-10-CM | POA: Diagnosis not present

## 2014-01-16 DIAGNOSIS — R260 Ataxic gait: Secondary | ICD-10-CM | POA: Insufficient documentation

## 2014-01-16 DIAGNOSIS — Z5189 Encounter for other specified aftercare: Secondary | ICD-10-CM | POA: Diagnosis present

## 2014-01-17 NOTE — Telephone Encounter (Signed)
Medication changes done by Forestine Na have caused her to be stressed. She will follow up with Dr. Laurance Flatten on the 15th and will discuss at that time.  Given re-assurance and ask to call us for any further problems.

## 2014-01-18 ENCOUNTER — Ambulatory Visit: Payer: Medicare Other | Admitting: Physical Therapy

## 2014-01-18 DIAGNOSIS — Z5189 Encounter for other specified aftercare: Secondary | ICD-10-CM | POA: Diagnosis not present

## 2014-01-23 ENCOUNTER — Ambulatory Visit: Payer: Medicare Other | Admitting: Physical Therapy

## 2014-01-23 DIAGNOSIS — Z5189 Encounter for other specified aftercare: Secondary | ICD-10-CM | POA: Diagnosis not present

## 2014-01-25 ENCOUNTER — Ambulatory Visit: Payer: Medicare Other | Admitting: Physical Therapy

## 2014-01-25 ENCOUNTER — Ambulatory Visit (INDEPENDENT_AMBULATORY_CARE_PROVIDER_SITE_OTHER): Payer: Medicare Other | Admitting: Family Medicine

## 2014-01-25 ENCOUNTER — Telehealth: Payer: Self-pay | Admitting: Family Medicine

## 2014-01-25 ENCOUNTER — Encounter: Payer: Self-pay | Admitting: Family Medicine

## 2014-01-25 VITALS — BP 108/62 | HR 97 | Temp 98.3°F | Ht 62.0 in | Wt 155.0 lb

## 2014-01-25 DIAGNOSIS — R739 Hyperglycemia, unspecified: Secondary | ICD-10-CM

## 2014-01-25 DIAGNOSIS — R7309 Other abnormal glucose: Secondary | ICD-10-CM

## 2014-01-25 DIAGNOSIS — Z5189 Encounter for other specified aftercare: Secondary | ICD-10-CM | POA: Diagnosis not present

## 2014-01-25 DIAGNOSIS — J449 Chronic obstructive pulmonary disease, unspecified: Secondary | ICD-10-CM

## 2014-01-25 DIAGNOSIS — Z96642 Presence of left artificial hip joint: Secondary | ICD-10-CM

## 2014-01-25 NOTE — Patient Instructions (Signed)
Continue physical therapy as ordered by orthopedic surgeon Resume aspirin 81 mg, coated one daily Continue to drink plenty of fluid Continue Symbicort inhaler and did not forget to rinse her mouth out after using

## 2014-01-25 NOTE — Telephone Encounter (Signed)
She should continue this medication at least until she sees the surgeon again

## 2014-01-25 NOTE — Telephone Encounter (Signed)
This is fyi  [facility] - how long do you think she should continue this product?

## 2014-01-25 NOTE — Progress Notes (Signed)
Subjective:    Patient ID: [name] [name], female    DOB: Sep 21, 1931, 80 y.o.   MRN: [id number]  HPI Patient here today for hospital follow up from left hip surgery. She is accompanied today by her husband. The patient is recovering well from her left hip arthroplasty. She is off of the advanced blood thinner and request the need of going back on her 81 mg aspirin. She comes to the visit today with her husband. She is alert and doing well and recovering slowly. She has decided to not go back to work. She sees the orthopedic surgeon again in a couple of weeks. She indicates that next week she will be increasing her outpatient physical therapy to 3 times weekly.        Patient Active Problem List   Diagnosis Date Noted  . Elevated liver enzymes 01/13/2014  . Edema 01/02/2014  . Hypoxia 01/02/2014  . S/P total hip arthroplasty 12/27/2013  . Chronic cough 10/26/2013  . Hyperlipidemia   . Anxiety   . Depressive disorder, not elsewhere classified   . Rhinitis   . Elevated blood sugar   . Atrophic vaginitis   . Postmenopausal    Outpatient Encounter Prescriptions as of 01/25/2014  Medication Sig  . acetaminophen (TYLENOL) 500 MG tablet Take 1,000 mg by mouth daily as needed for mild pain or moderate pain.  Marland Kitchen albuterol (PROVENTIL) (2.5 MG/3ML) 0.083% nebulizer solution Take 2.5 mg by nebulization every 6 (six) hours as needed for wheezing or shortness of breath.  . ALPRAZolam (XANAX) 0.25 MG tablet Take one tablet by mouth twice daily for anxiety  . Alum & Mag Hydroxide-Simeth (MAGIC MOUTHWASH) SOLN Take 5 mLs by mouth 4 (four) times daily as needed for mouth pain.  Marland Kitchen amLODipine (NORVASC) 5 MG tablet Take 5 mg by mouth daily.  . calcium carbonate (OS-CAL) 600 MG TABS tablet Take 600 mg by mouth 2 (two) times daily with a meal.  . fluticasone (FLONASE) 50 MCG/ACT nasal spray Place 1 spray into both nostrils daily.  Marland Kitchen lovastatin (MEVACOR) 40 MG tablet TAKE ONE half TABLET BY MOUTH ONE  TIME DAILY  . Naproxen Sodium (ALEVE) 220 MG CAPS Take 1 capsule by mouth daily as needed.  . Omega-3 Fatty Acids (FISH OIL) 1000 MG CAPS Take 2 capsules by mouth daily.  . traMADol (ULTRAM) 50 MG tablet Take one tablet by mouth twice daily as needed for pain  . [DISCONTINUED] rivaroxaban (XARELTO) 10 MG TABS tablet Take 10 mg by mouth daily.  Marland Kitchen aspirin 81 MG EC tablet Take 81 mg by mouth daily.    . [DISCONTINUED] Fluticasone Furoate-Vilanterol (BREO ELLIPTA IN) Inhale 1 puff into the lungs daily.    Review of Systems  Constitutional: Negative.   HENT: Negative.   Eyes: Negative.   Respiratory: Negative.   Cardiovascular: Negative.   Gastrointestinal: Negative.   Endocrine: Negative.   Genitourinary: Negative.   Musculoskeletal: Positive for arthralgias (left hip pain from sx).  Skin: Negative.   Allergic/Immunologic: Negative.   Neurological: Negative.   Hematological: Negative.   Psychiatric/Behavioral: Negative.        Objective:   Physical Exam  Nursing note and vitals reviewed. Constitutional: She is oriented to person, place, and time. She appears well-developed and well-nourished. No distress.  The patient looks good for her age and considering she's getting over left hip arthroplasty  HENT:  Head: Normocephalic and atraumatic.  Right Ear: External ear normal.  Left Ear: External ear normal.  Mouth/Throat:  Oropharynx is clear and moist. No oropharyngeal exudate.  Some nasal congestion bilaterally  Eyes: Conjunctivae and EOM are normal. Pupils are equal, round, and reactive to light. Right eye exhibits no discharge. Left eye exhibits no discharge. No scleral icterus.  Neck: Normal range of motion. Neck supple. No thyromegaly present.  Cardiovascular: Normal rate, regular rhythm, normal heart sounds and intact distal pulses.  Exam reveals no gallop and no friction rub.   No murmur heard. At 96 per minute and the rhythm is regular  Pulmonary/Chest: Effort normal and  breath sounds normal. No respiratory distress. She has no wheezes. She has no rales. She exhibits no tenderness.  Abdominal: Soft. Bowel sounds are normal. She exhibits no mass. There is no tenderness. There is no rebound and no guarding.  Musculoskeletal: Normal range of motion. She exhibits no edema and no tenderness.  The left hip surgical scar appears to be healing well. There is slight redness at the inferior portion of the scar but no sign of any stitch. There is no drainage  Lymphadenopathy:    She has no cervical adenopathy.  Neurological: She is alert and oriented to person, place, and time. She has normal reflexes. No cranial nerve deficit.  Skin: Skin is warm and dry. No rash noted.  Psychiatric: She has a normal mood and affect. Her behavior is normal. Judgment and thought content normal.   BP 108/62  Pulse 97  Temp(Src) 98.3 F (36.8 C) (Oral)  Ht 5\' 2"  (1.575 m)  Wt 155 lb (70.308 kg)  BMI 28.34 kg/m2        Assessment & Plan:  1. Elevated blood sugar  2. History of total left hip arthroplasty  3. COPD with chronic bronchitis  Meds ordered this encounter  Medications  . Omega-3 Fatty Acids (FISH OIL) 1000 MG CAPS    Sig: Take 2 capsules by mouth daily.  . calcium carbonate (OS-CAL) 600 MG TABS tablet    Sig: Take 600 mg by mouth 2 (two) times daily with a meal.   Patient Instructions  Continue physical therapy as ordered by orthopedic surgeon Resume aspirin 81 mg, coated one daily Continue to drink plenty of fluid Continue Symbicort inhaler and did not forget to rinse her mouth out after using   Arrie Senate MD

## 2014-01-26 ENCOUNTER — Other Ambulatory Visit: Payer: Self-pay | Admitting: *Deleted

## 2014-01-26 ENCOUNTER — Other Ambulatory Visit: Payer: Self-pay | Admitting: Family Medicine

## 2014-01-26 MED ORDER — CITALOPRAM HYDROBROMIDE 20 MG PO TABS: ORAL_TABLET | ORAL | Status: DC

## 2014-01-26 NOTE — Progress Notes (Signed)
Pt decided she did want the celexa back and it was sent to pharm

## 2014-01-26 NOTE — Telephone Encounter (Signed)
Pt aware.

## 2014-01-29 ENCOUNTER — Telehealth: Payer: Self-pay | Admitting: Family Medicine

## 2014-01-29 ENCOUNTER — Ambulatory Visit (INDEPENDENT_AMBULATORY_CARE_PROVIDER_SITE_OTHER): Payer: Medicare Other | Admitting: Family Medicine

## 2014-01-29 ENCOUNTER — Encounter: Payer: Self-pay | Admitting: Family Medicine

## 2014-01-29 VITALS — BP 140/80 | HR 121 | Temp 98.0°F | Ht 62.0 in | Wt 153.0 lb

## 2014-01-29 DIAGNOSIS — E860 Dehydration: Secondary | ICD-10-CM

## 2014-01-29 DIAGNOSIS — K529 Noninfective gastroenteritis and colitis, unspecified: Secondary | ICD-10-CM

## 2014-01-29 DIAGNOSIS — R5383 Other fatigue: Secondary | ICD-10-CM

## 2014-01-29 MED ORDER — ONDANSETRON 4 MG PO TBDP: 4.0000 mg | ORAL_TABLET | Freq: Three times a day (TID) | ORAL | Status: DC | PRN

## 2014-01-29 MED ORDER — ALPRAZOLAM 0.25 MG PO TABS: ORAL_TABLET | ORAL | Status: DC

## 2014-01-29 NOTE — Telephone Encounter (Signed)
appt bill o tonight

## 2014-01-29 NOTE — Progress Notes (Signed)
   Subjective:    Patient ID: [name] [name], female    DOB: [birth year], 80 y.o.   MRN: [id number]  HPI  This 80 y.o. female presents for evaluation of nausea and vomiting for 3 days.  She is weak and feels worse when she stands up.   Review of Systems C/o fatigue, nausa, vomiting, and diarrhea   No chest pain, SOB, HA, dizziness, vision change,  constipation, dysuria, urinary urgency or frequency, myalgias, arthralgias or rash.  Objective:   Physical Exam Vital signs noted  Well developed well nourished female.  HEENT - Head atraumatic Normocephalic                Eyes - PERRLA, Conjuctiva - clear Sclera- Clear EOMI                Ears - EAC's Wnl TM's Wnl Gross Hearing WNL                Throat - oropharanx wnl Respiratory - Lungs CTA bilateral Cardiac - RRR S1 and S2 without murmur GI - Abdomen soft Nontender and bowel sounds active x 4 Extremities - No edema. Neuro - Grossly intact.       Assessment & Plan:  Noninfectious gastroenteritis, unspecified - Plan: ondansetron (ZOFRAN ODT) 4 MG disintegrating tablet  Dehydration - Plan: ondansetron (ZOFRAN ODT) 4 MG disintegrating tablet  Other fatigue - Plan: POCT CBC, POCT UA - Microscopic Only, POCT urinalysis dipstick  Anxiety - Xanax refilled  IV NS over one hour  Push po fluids, rest, tylenol and motrin otc prn as directed for fever, arthralgias, and myalgias.  Follow up prn if sx's continue or persist.  Lysbeth Penner FNP

## 2014-01-29 NOTE — Telephone Encounter (Signed)
Patient only wants to  See dr [name] [name] feels really bad Having dry heaves  just feels really bad wants to be seen by [facility]

## 2014-01-29 NOTE — Telephone Encounter (Signed)
appt made for night clinic by jamie b.

## 2014-01-30 ENCOUNTER — Ambulatory Visit: Payer: Medicare Other | Admitting: *Deleted

## 2014-01-30 DIAGNOSIS — Z5189 Encounter for other specified aftercare: Secondary | ICD-10-CM | POA: Diagnosis not present

## 2014-01-31 ENCOUNTER — Telehealth: Payer: Self-pay | Admitting: *Deleted

## 2014-01-31 MED ORDER — PROMETHAZINE HCL 12.5 MG PO TABS
12.5000 mg | ORAL_TABLET | Freq: Three times a day (TID) | ORAL | Status: DC | PRN
Start: 2014-01-31 — End: 2014-03-22

## 2014-01-31 NOTE — Telephone Encounter (Signed)
Pt's husband comes in to inform that pt is having continued nausea Zofran is not working well Would like to try something different Phenergan rx sent into pharmacy per [employer]

## 2014-02-01 ENCOUNTER — Ambulatory Visit: Payer: Medicare Other | Admitting: Physical Therapy

## 2014-02-01 DIAGNOSIS — Z5189 Encounter for other specified aftercare: Secondary | ICD-10-CM | POA: Diagnosis not present

## 2014-02-06 ENCOUNTER — Ambulatory Visit: Payer: Medicare Other | Admitting: Physical Therapy

## 2014-02-06 DIAGNOSIS — Z5189 Encounter for other specified aftercare: Secondary | ICD-10-CM | POA: Diagnosis not present

## 2014-02-08 ENCOUNTER — Ambulatory Visit: Payer: Medicare Other | Admitting: Physical Therapy

## 2014-02-08 DIAGNOSIS — Z5189 Encounter for other specified aftercare: Secondary | ICD-10-CM | POA: Diagnosis not present

## 2014-02-13 ENCOUNTER — Ambulatory Visit: Payer: Medicare Other | Attending: Orthopedic Surgery | Admitting: Physical Therapy

## 2014-02-13 DIAGNOSIS — M25559 Pain in unspecified hip: Secondary | ICD-10-CM | POA: Diagnosis not present

## 2014-02-13 DIAGNOSIS — R260 Ataxic gait: Secondary | ICD-10-CM | POA: Diagnosis not present

## 2014-02-13 DIAGNOSIS — Z5189 Encounter for other specified aftercare: Secondary | ICD-10-CM | POA: Diagnosis not present

## 2014-02-13 DIAGNOSIS — R5381 Other malaise: Secondary | ICD-10-CM | POA: Diagnosis not present

## 2014-02-13 DIAGNOSIS — Z96642 Presence of left artificial hip joint: Secondary | ICD-10-CM | POA: Diagnosis not present

## 2014-02-15 ENCOUNTER — Ambulatory Visit: Payer: Medicare Other | Admitting: Physical Therapy

## 2014-02-15 DIAGNOSIS — Z5189 Encounter for other specified aftercare: Secondary | ICD-10-CM | POA: Diagnosis not present

## 2014-02-20 ENCOUNTER — Ambulatory Visit: Payer: Medicare Other | Admitting: Physical Therapy

## 2014-02-20 DIAGNOSIS — Z5189 Encounter for other specified aftercare: Secondary | ICD-10-CM | POA: Diagnosis not present

## 2014-02-22 ENCOUNTER — Ambulatory Visit: Payer: Medicare Other | Admitting: Physical Therapy

## 2014-02-22 DIAGNOSIS — Z5189 Encounter for other specified aftercare: Secondary | ICD-10-CM | POA: Diagnosis not present

## 2014-02-27 ENCOUNTER — Ambulatory Visit: Payer: Medicare Other | Admitting: Physical Therapy

## 2014-02-27 DIAGNOSIS — Z5189 Encounter for other specified aftercare: Secondary | ICD-10-CM | POA: Diagnosis not present

## 2014-03-01 ENCOUNTER — Ambulatory Visit: Payer: Medicare Other | Admitting: Physical Therapy

## 2014-03-01 DIAGNOSIS — Z5189 Encounter for other specified aftercare: Secondary | ICD-10-CM | POA: Diagnosis not present

## 2014-03-06 ENCOUNTER — Ambulatory Visit: Payer: Medicare Other | Admitting: Physical Therapy

## 2014-03-06 DIAGNOSIS — Z5189 Encounter for other specified aftercare: Secondary | ICD-10-CM | POA: Diagnosis not present

## 2014-03-07 ENCOUNTER — Observation Stay (HOSPITAL_COMMUNITY)
Admission: EM | Admit: 2014-03-07 | Discharge: 2014-03-08 | Disposition: A | Discharge status: Home / Self Care | Payer: Medicare Other | Attending: Family Medicine | Admitting: Family Medicine

## 2014-03-07 ENCOUNTER — Encounter (HOSPITAL_COMMUNITY): Payer: Self-pay | Admitting: Cardiology

## 2014-03-07 DIAGNOSIS — R1030 Lower abdominal pain, unspecified: Secondary | ICD-10-CM

## 2014-03-07 DIAGNOSIS — K529 Noninfective gastroenteritis and colitis, unspecified: Secondary | ICD-10-CM | POA: Diagnosis not present

## 2014-03-07 DIAGNOSIS — K625 Hemorrhage of anus and rectum: Secondary | ICD-10-CM | POA: Diagnosis present

## 2014-03-07 DIAGNOSIS — Z79899 Other long term (current) drug therapy: Secondary | ICD-10-CM | POA: Diagnosis not present

## 2014-03-07 DIAGNOSIS — F419 Anxiety disorder, unspecified: Secondary | ICD-10-CM | POA: Insufficient documentation

## 2014-03-07 DIAGNOSIS — Z881 Allergy status to other antibiotic agents status: Secondary | ICD-10-CM | POA: Insufficient documentation

## 2014-03-07 DIAGNOSIS — J310 Chronic rhinitis: Secondary | ICD-10-CM | POA: Diagnosis present

## 2014-03-07 DIAGNOSIS — J302 Other seasonal allergic rhinitis: Secondary | ICD-10-CM

## 2014-03-07 DIAGNOSIS — Z7982 Long term (current) use of aspirin: Secondary | ICD-10-CM | POA: Diagnosis not present

## 2014-03-07 DIAGNOSIS — Z96642 Presence of left artificial hip joint: Secondary | ICD-10-CM | POA: Diagnosis not present

## 2014-03-07 DIAGNOSIS — K644 Residual hemorrhoidal skin tags: Secondary | ICD-10-CM | POA: Diagnosis not present

## 2014-03-07 DIAGNOSIS — E785 Hyperlipidemia, unspecified: Secondary | ICD-10-CM | POA: Diagnosis not present

## 2014-03-07 DIAGNOSIS — I10 Essential (primary) hypertension: Secondary | ICD-10-CM | POA: Insufficient documentation

## 2014-03-07 DIAGNOSIS — H269 Unspecified cataract: Secondary | ICD-10-CM | POA: Diagnosis not present

## 2014-03-07 DIAGNOSIS — F329 Major depressive disorder, single episode, unspecified: Secondary | ICD-10-CM | POA: Diagnosis not present

## 2014-03-07 DIAGNOSIS — Z882 Allergy status to sulfonamides status: Secondary | ICD-10-CM | POA: Diagnosis not present

## 2014-03-07 DIAGNOSIS — K5730 Diverticulosis of large intestine without perforation or abscess without bleeding: Secondary | ICD-10-CM | POA: Insufficient documentation

## 2014-03-07 DIAGNOSIS — Z880 Allergy status to penicillin: Secondary | ICD-10-CM | POA: Insufficient documentation

## 2014-03-07 DIAGNOSIS — J439 Emphysema, unspecified: Secondary | ICD-10-CM | POA: Diagnosis not present

## 2014-03-07 DIAGNOSIS — Z888 Allergy status to other drugs, medicaments and biological substances status: Secondary | ICD-10-CM | POA: Diagnosis not present

## 2014-03-07 LAB — CBC WITH DIFFERENTIAL/PLATELET
Basophils Absolute: 0 10*3/uL (ref 0.0–0.1)
Basophils Relative: 0 % (ref 0–1)
Eosinophils Absolute: 0.1 10*3/uL (ref 0.0–0.7)
Eosinophils Relative: 1 % (ref 0–5)
HCT: 42.5 % (ref 36.0–46.0)
Hemoglobin: 13.8 g/dL (ref 12.0–15.0)
Lymphocytes Relative: 13 % (ref 12–46)
Lymphs Abs: 1.4 10*3/uL (ref 0.7–4.0)
MCH: 29.2 pg (ref 26.0–34.0)
MCHC: 32.5 g/dL (ref 30.0–36.0)
MCV: 90 fL (ref 78.0–100.0)
Monocytes Absolute: 0.5 10*3/uL (ref 0.1–1.0)
Monocytes Relative: 4 % (ref 3–12)
Neutro Abs: 8.7 10*3/uL — ABNORMAL HIGH (ref 1.7–7.7)
Neutrophils Relative %: 82 % — ABNORMAL HIGH (ref 43–77)
Platelets: 312 10*3/uL (ref 150–400)
RBC: 4.72 MIL/uL (ref 3.87–5.11)
RDW: 13.7 % (ref 11.5–15.5)
WBC: 10.7 10*3/uL — ABNORMAL HIGH (ref 4.0–10.5)

## 2014-03-07 LAB — CBC
HCT: 41.7 % (ref 36.0–46.0)
HEMATOCRIT: 39.5 % (ref 36.0–46.0)
Hemoglobin: 13.5 g/dL (ref 12.0–15.0)
Hemoglobin: 13.2 g/dL (ref 12.0–15.0)
MCH: 29.3 pg (ref 26.0–34.0)
MCH: 29.8 pg (ref 26.0–34.0)
MCHC: 32.4 g/dL (ref 30.0–36.0)
MCHC: 33.4 g/dL (ref 30.0–36.0)
MCV: 90.7 fL (ref 78.0–100.0)
MCV: 89.2 fL (ref 78.0–100.0)
PLATELETS: 274 10*3/uL (ref 150–400)
PLATELETS: 287 10*3/uL (ref 150–400)
RBC: 4.6 MIL/uL (ref 3.87–5.11)
RBC: 4.43 MIL/uL (ref 3.87–5.11)
RDW: 13.8 % (ref 11.5–15.5)
RDW: 13.7 % (ref 11.5–15.5)
WBC: 10.5 10*3/uL (ref 4.0–10.5)
WBC: 11.9 10*3/uL — AB (ref 4.0–10.5)

## 2014-03-07 LAB — COMPREHENSIVE METABOLIC PANEL
ALT: 13 U/L (ref 0–35)
AST: 18 U/L (ref 0–37)
Albumin: 3.8 g/dL (ref 3.5–5.2)
Alkaline Phosphatase: 88 U/L (ref 39–117)
Anion gap: 13 (ref 5–15)
BUN: 11 mg/dL (ref 6–23)
CO2: 26 mEq/L (ref 19–32)
Calcium: 9.8 mg/dL (ref 8.4–10.5)
Chloride: 99 mEq/L (ref 96–112)
Creatinine, Ser: 0.66 mg/dL (ref 0.50–1.10)
GFR calc Af Amer: >90 mL/min (ref >90)
GFR calc non Af Amer: 82 mL/min — ABNORMAL LOW (ref >90)
Glucose, Bld: 180 mg/dL — ABNORMAL HIGH (ref 70–99)
Potassium: 3.9 mEq/L (ref 3.7–5.3)
Sodium: 138 mEq/L (ref 137–147)
Total Bilirubin: 0.5 mg/dL (ref 0.3–1.2)
Total Protein: 7.5 g/dL (ref 6.0–8.3)

## 2014-03-07 LAB — MAGNESIUM: MAGNESIUM: 1.9 mg/dL (ref 1.5–2.5)

## 2014-03-07 LAB — TYPE AND SCREEN
ABO/RH(D): O NEG
Antibody Screen: NEGATIVE

## 2014-03-07 LAB — PHOSPHORUS: Phosphorus: 3.7 mg/dL (ref 2.3–4.6)

## 2014-03-07 MED ORDER — FLUTICASONE PROPIONATE 50 MCG/ACT NA SUSP
1.0000 | Freq: Every day | NASAL | Status: DC
  Administered 2014-03-08: 1 via NASAL
  Filled 2014-03-07: qty 16

## 2014-03-07 MED ORDER — AMLODIPINE BESYLATE 5 MG PO TABS
5.0000 mg | ORAL_TABLET | Freq: Every day | ORAL | Status: DC
  Administered 2014-03-07 – 2014-03-08 (×2): 5 mg via ORAL
  Filled 2014-03-07 (×2): qty 1

## 2014-03-07 MED ORDER — ALPRAZOLAM 0.25 MG PO TABS
0.2500 mg | ORAL_TABLET | Freq: Two times a day (BID) | ORAL | Status: DC | PRN
  Administered 2014-03-07: 0.25 mg via ORAL
  Filled 2014-03-07: qty 1

## 2014-03-07 MED ORDER — PEG 3350-KCL-NABCB-NACL-NASULF 236 G PO SOLR
4000.0000 mL | Freq: Once | ORAL | Status: AC
  Administered 2014-03-07: 4000 mL via ORAL
  Filled 2014-03-07: qty 4000

## 2014-03-07 MED ORDER — SODIUM CHLORIDE 0.9 % IV SOLN
INTRAVENOUS | Status: DC
  Administered 2014-03-07: 10:00:00 via INTRAVENOUS

## 2014-03-07 MED ORDER — TRAMADOL HCL 50 MG PO TABS: 50.0000 mg | ORAL_TABLET | Freq: Two times a day (BID) | ORAL | Status: DC | PRN

## 2014-03-07 MED ORDER — KCL IN DEXTROSE-NACL 20-5-0.45 MEQ/L-%-% IV SOLN
INTRAVENOUS | Status: DC
  Administered 2014-03-07 – 2014-03-08 (×2): via INTRAVENOUS

## 2014-03-07 MED ORDER — PRAVASTATIN SODIUM 40 MG PO TABS
40.0000 mg | ORAL_TABLET | Freq: Every day | ORAL | Status: DC
  Administered 2014-03-07: 40 mg via ORAL
  Filled 2014-03-07: qty 1

## 2014-03-07 NOTE — ED Notes (Signed)
Report given to [place], RN for 3A.

## 2014-03-07 NOTE — ED Provider Notes (Signed)
CSN: [id number]     Arrival date & time 03/07/14  5638 History  This chart was scribed for Virgel Manifold, MD by Zola Button, ED Scribe. This patient was seen in room APA08/APA08 and the patient's care was started at 9:33 AM.     Chief Complaint  Patient presents with  . Rectal Bleeding    Patient is a 80 y.o. female presenting with hematochezia. The history is provided by the patient. No language interpreter was used.  Rectal Bleeding Quality:  Bright red Duration:  12 hours Timing:  Intermittent Progression:  Unchanged Chronicity:  New Context: diarrhea   Similar prior episodes: no   Relieved by:  None tried Worsened by:  Nothing tried Ineffective treatments:  None tried Associated symptoms: abdominal pain   Associated symptoms: no dizziness and no light-headedness     HPI Comments: [name] [name] is a 80 y.o. female who presents to the Emergency Department complaining of rectal bleeding last night. Patient reports having diarrhea, about 6 episodes, all night last night and noticed all blood in the last 2 episodes; she did not turn on the lights the first 3 episodes and did not look at her stools then. She reports having generalized abdominal pain that has improved since last night. She is on baby aspirin, but no other blood thinners. Took xarelto briefly after hip surgery two months ago but reports this was only for a few additional days after she left hospital. . She has not had a colonoscopy in about 25 years, but she has not had any abnormal findings. Patient denies nausea, dizziness, SOB, and lightheadedness. She had left hip surgery on 12/27/13. Patient does have hx of hemorrhoids, but it was several years ago.   Past Medical History  Diagnosis Date  . Other and unspecified hyperlipidemia   . Anxiety   . Disorder of bone and cartilage, unspecified   . Depressive disorder, not elsewhere classified   . Rhinitis   . Elevated blood sugar   . Atrophic vaginitis   .  Postmenopausal   . Cataract   . Arthritis   . Hypertension   . COPD (chronic obstructive pulmonary disease)   . Emphysema lung    Past Surgical History  Procedure Laterality Date  . Cataract extraction w/ intraocular lens implant Bilateral 2005    [place]  . Appendectomy  1957  . Total hip arthroplasty Left 12/27/2013    Procedure: LEFT TOTAL HIP ARTHROPLASTY;  Surgeon: Newt Minion, MD;  Location: [place];  Service: Orthopedics;  Laterality: Left;   Family History  Problem Relation Age of Onset  . Arthritis Mother   . COPD Father   . Diabetes Father   . Aneurysm Sister    History  Substance Use Topics  . Smoking status: Never Smoker   . Smokeless tobacco: Never Used  . Alcohol Use: No   OB History    No data available     Review of Systems  Respiratory: Negative for shortness of breath.   Gastrointestinal: Positive for abdominal pain, diarrhea, hematochezia and anal bleeding. Negative for nausea.  Neurological: Negative for dizziness and light-headedness.  All other systems reviewed and are negative.     Allergies  Cephalexin; Chlordiazepoxide-clidinium; Clarithromycin; Erymax; Evista; Penicillins; Sertraline hcl; Sulfa antibiotics; Betadine; and Hydrogen peroxide  Home Medications   Prior to Admission medications   Medication Sig Start Date End Date Taking? Authorizing Provider  acetaminophen (TYLENOL) 500 MG tablet Take 1,000 mg by mouth daily as needed for mild  pain or moderate pain.    Historical Provider, MD  ALPRAZolam Duanne Moron) 0.25 MG tablet Take one tablet by mouth twice daily for anxiety 01/29/14   Lysbeth Penner, FNP  amLODipine (NORVASC) 5 MG tablet Take 5 mg by mouth daily.    Historical Provider, MD  aspirin 81 MG EC tablet Take 81 mg by mouth daily.      Historical Provider, MD  calcium carbonate (OS-CAL) 600 MG TABS tablet Take 600 mg by mouth 2 (two) times daily with a meal.    Historical Provider, MD  citalopram (CELEXA) 20 MG tablet TAKE ONE TABLET  BY MOUTH ONE TIME DAILY 01/26/14   Chipper Herb, MD  fluticasone [facility]) 50 MCG/ACT nasal spray Place 1 spray into both nostrils daily.    Historical Provider, MD  lovastatin (MEVACOR) 40 MG tablet TAKE ONE half TABLET BY MOUTH ONE TIME DAILY    Chipper Herb, MD  Naproxen Sodium (ALEVE) 220 MG CAPS Take 1 capsule by mouth daily as needed.    Historical Provider, MD  Omega-3 Fatty Acids (FISH OIL) 1000 MG CAPS Take 2 capsules by mouth daily.    Historical Provider, MD  ondansetron (ZOFRAN ODT) 4 MG disintegrating tablet Take 1 tablet (4 mg total) by mouth every 8 (eight) hours as needed for nausea or vomiting. 01/29/14   Lysbeth Penner, FNP  promethazine (PHENERGAN) 12.5 MG tablet Take 1 tablet (12.5 mg total) by mouth every 8 (eight) hours as needed for nausea or vomiting. 01/31/14   Lysbeth Penner, FNP  traMADol Veatrice Bourbon) 50 MG tablet Take one tablet by mouth twice daily as needed for pain 01/01/14   Tiffany L Reed, DO   BP 156/74 mmHg  Pulse 96  Temp(Src) 97.6 F (36.4 C) (Oral)  Resp 24  Ht 5\' 2"  (1.575 m)  Wt 153 lb (69.4 kg)  BMI 27.98 kg/m2  SpO2 95% Physical Exam  Constitutional: She is oriented to person, place, and time. She appears well-developed and well-nourished.  HENT:  Head: Normocephalic and atraumatic.  Eyes: Conjunctivae and EOM are normal. Pupils are equal, round, and reactive to light.  Neck: Normal range of motion. Neck supple.  Cardiovascular: Normal rate, regular rhythm and normal heart sounds.   No murmur heard. Pulmonary/Chest: Effort normal and breath sounds normal. No respiratory distress. She has no wheezes. She has no rales.  Abdominal: Soft. Bowel sounds are normal.  Genitourinary:  Rectal exam with no external lesions noted. No stool in rectal vault. Scant BRB noted on glove.   Musculoskeletal: Normal range of motion.  Neurological: She is alert and oriented to person, place, and time.  Skin: Skin is warm and dry.  Psychiatric: She has a normal  mood and affect. Her behavior is normal.  Nursing note and vitals reviewed.   ED Course  Procedures  DIAGNOSTIC STUDIES: Oxygen Saturation is 95% on room air, adequate by my interpretation.    COORDINATION OF CARE: 9:44 AM-Discussed treatment plan which includes labs with pt at bedside and pt agreed to plan.   Labs Review Labs Reviewed  CBC WITH DIFFERENTIAL - Abnormal; Notable for the following:    WBC 10.7 (*)    Neutrophils Relative % 82 (*)    Neutro Abs 8.7 (*)    All other components within normal limits  COMPREHENSIVE METABOLIC PANEL - Abnormal; Notable for the following:    Glucose, Bld 180 (*)    GFR calc non Af Amer 82 (*)    All other  components within normal limits  CBC - Abnormal; Notable for the following:    WBC 11.9 (*)    All other components within normal limits  CBC - Abnormal; Notable for the following:    Hemoglobin 11.7 (*)    HCT 35.3 (*)    All other components within normal limits  STOOL CULTURE  OVA AND PARASITE EXAMINATION  STOOL CULTURE  OVA AND PARASITE EXAMINATION  MAGNESIUM  PHOSPHORUS  CBC  CBC  TYPE AND SCREEN  SURGICAL PATHOLOGY    Imaging Review No results found.   EKG Interpretation None      MDM   Final diagnoses:  Rectal bleeding    81yF with BRBPR. HD stable. H/H normal. BRB noted on DRE and continues to pass BRB while in ED, albeit small quantitiy. Admit.   I personally preformed the services scribed in my presence. The recorded information has been reviewed is accurate. Virgel Manifold, MD.    Virgel Manifold, MD 03/14/14 2051

## 2014-03-07 NOTE — H&P (Signed)
[facility] History and Physical  [name] [name] FYB:[id number] DOB: [birth year] DOA: 03/07/2014  Referring physician: Dr. Wilson Singer PCP: Redge Gainer, MD   Chief Complaint: BRBPR  HPI: [name] [name] is a 80 y.o. female  With history of anxiety, depression, and hyperlipidemia on aspirin at home. She presents complaining of bright red blood per rectum 1 day. The problem has been persistent since onset. Nothing she is aware of makes it better or worse. She denies any recent travel outside of the country. She denies any sick contacts. She does however report diarrhea.  We were consulted for further evaluation,  monitoring, and recommendations.   Review of Systems:  Constitutional:  No weight loss, night sweats, Fevers, chills, fatigue.  HEENT:  No headaches, Difficulty swallowing,Tooth/dental problems,Sore throat,  No sneezing, itching, ear ache, nasal congestion, post nasal drip,  Cardio-vascular:  No chest pain, Orthopnea, PND, swelling in lower extremities, anasarca, dizziness, palpitations  GI:  No heartburn, indigestion, abdominal pain, nausea, vomiting, + diarrhea, change in bowel habits, loss of appetite  Resp:  No shortness of breath with exertion or at rest. No excess mucus, no productive cough, No non-productive cough, No coughing up of blood.No change in color of mucus.No wheezing.No chest wall deformity  Skin:  no rash or lesions.  GU:  no dysuria, change in color of urine, no urgency or frequency. No flank pain.  Musculoskeletal:  No joint pain or swelling. No decreased range of motion. No back pain.  Psych:  No change in mood or affect. No depression or anxiety (currently). No memory loss.   Past Medical History  Diagnosis Date  . Other and unspecified hyperlipidemia   . Anxiety   . Disorder of bone and cartilage, unspecified   . Depressive disorder, not elsewhere classified   . Rhinitis   . Elevated blood sugar   . Atrophic vaginitis   . Postmenopausal    . Cataract   . Arthritis   . Hypertension   . COPD (chronic obstructive pulmonary disease)   . Emphysema lung    Past Surgical History  Procedure Laterality Date  . Cataract extraction w/ intraocular lens implant Bilateral 2005    [place]  . Appendectomy  1957  . Total hip arthroplasty Left 12/27/2013    Procedure: LEFT TOTAL HIP ARTHROPLASTY;  Surgeon: Newt Minion, MD;  Location: [place];  Service: Orthopedics;  Laterality: Left;   Social History:  reports that she has never smoked. She has never used smokeless tobacco. She reports that she does not drink alcohol or use illicit drugs.  Allergies  Allergen Reactions  . Cephalexin     Can't remember  . Chlordiazepoxide-Clidinium     Can't remember  . Clarithromycin     Can't remember  . Erymax [Erythromycin]     Can't remember  . Evista [Raloxifene Hydrochloride]     Numb feeling  . Penicillins     Can't remember  . Sertraline Hcl Other (See Comments)    Sweating and mouth irritation   . Sulfa Antibiotics Nausea And Vomiting  . Betadine [Povidone Iodine] Rash  . Hydrogen Peroxide Rash    Family History  Problem Relation Age of Onset  . Arthritis Mother   . COPD Father   . Diabetes Father   . Aneurysm Sister      Prior to Admission medications   Medication Sig Start Date End Date Taking? Authorizing Provider  ALPRAZolam Duanne Moron) 0.25 MG tablet Take one tablet by mouth twice daily for anxiety Patient  taking differently: Take 0.25 mg by mouth 2 (two) times daily as needed for anxiety. Take one tablet by mouth twice daily for anxiety 01/29/14  Yes Lysbeth Penner, FNP  amLODipine (NORVASC) 5 MG tablet Take 5 mg by mouth daily.   Yes Historical Provider, MD  aspirin EC 81 MG tablet Take 81 mg by mouth daily.   Yes Historical Provider, MD  calcium carbonate (OS-CAL) 600 MG TABS tablet Take 600 mg by mouth 2 (two) times daily with a meal.   Yes Historical Provider, MD  cholecalciferol (VITAMIN D) 1000 UNITS tablet Take  2,000 Units by mouth daily.   Yes Historical Provider, MD  fluticasone (FLONASE) 50 MCG/ACT nasal spray Place 1 spray into both nostrils daily.   Yes Historical Provider, MD  lovastatin (MEVACOR) 40 MG tablet Take 20 mg by mouth at bedtime.   Yes Historical Provider, MD  Omega-3 Fatty Acids (FISH OIL) 1000 MG CAPS Take 2 capsules by mouth daily.   Yes Historical Provider, MD  traMADol (ULTRAM) 50 MG tablet Take one tablet by mouth twice daily as needed for pain Patient taking differently: Take 50-100 mg by mouth 2 (two) times daily as needed for moderate pain. Take one tablet by mouth twice daily as needed for pain 01/01/14  Yes Tiffany L Reed, DO  citalopram (CELEXA) 20 MG tablet TAKE ONE TABLET BY MOUTH ONE TIME DAILY Patient not taking: Reported on 03/07/2014 01/26/14   Chipper Herb, MD  ondansetron (ZOFRAN ODT) 4 MG disintegrating tablet Take 1 tablet (4 mg total) by mouth every 8 (eight) hours as needed for nausea or vomiting. Patient not taking: Reported on 03/07/2014 01/29/14   Lysbeth Penner, FNP  promethazine (PHENERGAN) 12.5 MG tablet Take 1 tablet (12.5 mg total) by mouth every 8 (eight) hours as needed for nausea or vomiting. Patient not taking: Reported on 03/07/2014 01/31/14   Lysbeth Penner, FNP   Physical Exam: Filed Vitals:   03/07/14 0903 03/07/14 1000 03/07/14 1100 03/07/14 1104  BP: 156/74 146/73 146/66 146/66  Pulse: 96 90 85 85  Temp: 97.6 F (36.4 C)     TempSrc: Oral     Resp: 24 22 21 18   Height: 5\' 2"  (1.575 m)     Weight: 69.4 kg (153 lb)     SpO2: 95% 94% 96% 98%    Wt Readings from Last 3 Encounters:  03/07/14 69.4 kg (153 lb)  01/29/14 69.4 kg (153 lb)  01/25/14 70.308 kg (155 lb)    General:  Appears calm and comfortable Eyes: PERRL, normal lids, irises & conjunctiva ENT: grossly normal hearing, lips & tongue Neck: no LAD, masses or thyromegaly Cardiovascular: RRR, no m/r/g. No LE edema. Telemetry: SR, no arrhythmias  Respiratory: CTA  bilaterally, no w/r/r. Normal respiratory effort. Abdomen: soft, ntnd Skin: no rash or induration seen on limited exam Musculoskeletal: grossly normal tone BUE/BLE Psychiatric: grossly normal mood and affect, speech fluent and appropriate Neurologic: grossly non-focal.          Labs on Admission:  Basic Metabolic Panel:  Recent Labs Lab 03/07/14 0948  NA 138  K 3.9  CL 99  CO2 26  GLUCOSE 180*  BUN 11  CREATININE 0.66  CALCIUM 9.8   Liver Function Tests:  Recent Labs Lab 03/07/14 0948  AST 18  ALT 13  ALKPHOS 88  BILITOT 0.5  PROT 7.5  ALBUMIN 3.8   No results for input(s): LIPASE, AMYLASE in the last 168 hours. No results for input(s):  AMMONIA in the last 168 hours. CBC:  Recent Labs Lab 03/07/14 0948  WBC 10.7*  NEUTROABS 8.7*  HGB 13.8  HCT 42.5  MCV 90.0  PLT 312   Cardiac Enzymes: No results for input(s): CKTOTAL, CKMB, CKMBINDEX, TROPONINI in the last 168 hours.  BNP (last 3 results) No results for input(s): PROBNP in the last 8760 hours. CBG: No results for input(s): GLUCAP in the last 168 hours.  Radiological Exams on Admission: No results found.   Assessment/Plan   Rectal bleeding/BRBPR (bright red blood per rectum) - SCDs for DVT - GI consult - Hold aspirin - Monitor hemoglobin levels every 8 hours  Active Problems:   Hyperlipidemia - Hold Lovaza, currently stable, no chest pain    Anxiety - stable, continue prn xanax    Rhinitis - stable continue flonase   History of total left hip arthroplasty   Code Status: full DVT Prophylaxis: SCD's Family Communication: discussed with spouse at bedside Disposition Plan: Pending improvement in condition and specialist recommendations.  Time spent: > 45 minutes  Velvet Bathe [facility] Pager [phone]

## 2014-03-07 NOTE — Plan of Care (Signed)
Problem: Consults Goal: GI Bleeding Patient Education See Patient Education Module for education specifics.  Outcome: Completed/Met Date Met:  03/07/14 Goal: Skin Care Protocol Initiated - if Braden Score 18 or less If consults are not indicated, leave blank or document N/A  Outcome: Not Applicable Date Met:  94/32/00 Goal: Nutrition Consult-if indicated Outcome: Not Applicable Date Met:  37/94/44  Problem: Phase I Progression Outcomes Goal: Pain controlled with appropriate interventions Outcome: Completed/Met Date Met:  03/07/14 Goal: OOB as tolerated unless otherwise ordered Outcome: Completed/Met Date Met:  03/07/14 Patient up to [facility] frequently. Uses cane to steady herself.

## 2014-03-07 NOTE — ED Notes (Signed)
Diarrhea all night.  Approximately 6  Bright red stools.  C/o abdominal pain.

## 2014-03-07 NOTE — Consult Note (Signed)
Reason for Consult: Rectal bleeding Referring Physician: Hospitalist  [name] [name] is an 80 y.o. female.  HPI: Admitted thru the ED this am after experiencing rectal bleeding. Her bleeding started last night which she describes as about a half a cup or more. She saw Dr Laurance Flatten this morning and he advised her to come to [facility]. She says says she had diarrhea x 6-7 times last night. She says the blood was bright red. No BM since admission. No prior hx of rectal bleeding. Her last colonoscopy was in [place] over 30 yrs ago and was normal. There has been no weight loss. Appetite is good. Usually has a BM daily. No change in her stools.  She sees PCP every 3 months. She had a left hip replacement 12/27/2013. Maintained on Xarelto x 11 days She takes Baby ASA on a daily basis. She does not take any other NSAIDS. NO hx of colon cancer.   Past Medical History  Diagnosis Date  . Other and unspecified hyperlipidemia   . Anxiety   . Disorder of bone and cartilage, unspecified   . Depressive disorder, not elsewhere classified   . Rhinitis   . Elevated blood sugar   . Atrophic vaginitis   . Postmenopausal   . Cataract   . Arthritis   . Hypertension   . COPD (chronic obstructive pulmonary disease)   . Emphysema lung     Past Surgical History  Procedure Laterality Date  . Cataract extraction w/ intraocular lens implant Bilateral 2005    [place]  . Appendectomy  1957  . Total hip arthroplasty Left 12/27/2013    Procedure: LEFT TOTAL HIP ARTHROPLASTY;  Surgeon: Newt Minion, MD;  Location: [place];  Service: Orthopedics;  Laterality: Left;    Family History  Problem Relation Age of Onset  . Arthritis Mother   . COPD Father   . Diabetes Father   . Aneurysm Sister     Social History:  reports that she has never smoked. She has never used smokeless tobacco. She reports that she does not drink alcohol or use illicit drugs.  Allergies:  Allergies  Allergen Reactions  . Cephalexin    Can't remember  . Chlordiazepoxide-Clidinium     Can't remember  . Clarithromycin     Can't remember  . Erymax [Erythromycin]     Can't remember  . Evista [Raloxifene Hydrochloride]     Numb feeling  . Penicillins     Can't remember  . Sertraline Hcl Other (See Comments)    Sweating and mouth irritation   . Sulfa Antibiotics Nausea And Vomiting  . Betadine [Povidone Iodine] Rash  . Hydrogen Peroxide Rash    Medications: I have reviewed the patient's current medications.  Results for orders placed or performed during the hospital encounter of 03/07/14 (from the past 48 hour(s))  CBC with Differential     Status: Abnormal   Collection Time: 03/07/14  9:48 AM  Result Value Ref Range   WBC 10.7 (H) 4.0 - 10.5 K/uL   RBC 4.72 3.87 - 5.11 MIL/uL   Hemoglobin 13.8 12.0 - 15.0 g/dL   HCT 42.5 36.0 - 46.0 %   MCV 90.0 78.0 - 100.0 fL   MCH 29.2 26.0 - 34.0 pg   MCHC 32.5 30.0 - 36.0 g/dL   RDW 13.7 11.5 - 15.5 %   Platelets 312 150 - 400 K/uL   Neutrophils Relative % 82 (H) 43 - 77 %   Neutro Abs 8.7 (H) 1.7 -  7.7 K/uL   Lymphocytes Relative 13 12 - 46 %   Lymphs Abs 1.4 0.7 - 4.0 K/uL   Monocytes Relative 4 3 - 12 %   Monocytes Absolute 0.5 0.1 - 1.0 K/uL   Eosinophils Relative 1 0 - 5 %   Eosinophils Absolute 0.1 0.0 - 0.7 K/uL   Basophils Relative 0 0 - 1 %   Basophils Absolute 0.0 0.0 - 0.1 K/uL  Comprehensive metabolic panel     Status: Abnormal   Collection Time: 03/07/14  9:48 AM  Result Value Ref Range   Sodium 138 137 - 147 mEq/L   Potassium 3.9 3.7 - 5.3 mEq/L   Chloride 99 96 - 112 mEq/L   CO2 26 19 - 32 mEq/L   Glucose, Bld 180 (H) 70 - 99 mg/dL   BUN 11 6 - 23 mg/dL   Creatinine, Ser 0.66 0.50 - 1.10 mg/dL   Calcium 9.8 8.4 - 10.5 mg/dL   Total Protein 7.5 6.0 - 8.3 g/dL   Albumin 3.8 3.5 - 5.2 g/dL   AST 18 0 - 37 U/L   ALT 13 0 - 35 U/L   Alkaline Phosphatase 88 39 - 117 U/L   Total Bilirubin 0.5 0.3 - 1.2 mg/dL   GFR calc non Af Amer 82 (L) >90  mL/min   GFR calc Af Amer >90 >90 mL/min    Comment: (NOTE) The eGFR has been calculated using the CKD EPI equation. This calculation has not been validated in all clinical situations. eGFR's persistently <90 mL/min signify possible Chronic Kidney Disease.    Anion gap 13 5 - 15  Type and screen     Status: None   Collection Time: 03/07/14 10:02 AM  Result Value Ref Range   ABO/RH(D) O NEG    Antibody Screen NEG    Sample Expiration 03/10/2014     No results found.  ROS Blood pressure 164/69, pulse 89, temperature 97.8 F (36.6 C), temperature source Oral, resp. rate 23, height 5' 2"  (1.575 m), weight 151 lb 0.2 oz (68.5 kg), SpO2 97 %. Physical Exam Alert and oriented. Skin warm and dry. Oral mucosa is moist.   . Sclera anicteric, conjunctivae is pink. Thyroid not enlarged. No cervical lymphadenopathy. Lungs clear. Heart regular rate and rhythm.  Abdomen is soft. Bowel sounds are positive. No hepatomegaly. No abdominal masses felt. No tenderness.  No edema to lower extremities.  Bright red blood noted in panties in patient's bag in room.  Assessment/Plan: Lower GI bleed.  Diverticular bleed needs to be ruled out. Colonic carcinoma, AVM, polyp, hemorrhoids in the differential Will discuss with Dr Laural Golden.  Possible colonoscopy tomorrow.  SETZER,TERRI W 03/07/2014, 12:23 PM   GI attending note; Patient interviewed and examined. Patient presents with painless large-volume hematochezia. Last colonoscopy was more than 10 years ago. She is hemodynamically stable and her H&H is normal. Patient will undergo colonoscopy in am following prep with [facility] today.

## 2014-03-08 ENCOUNTER — Encounter (HOSPITAL_COMMUNITY)
Admission: EM | Disposition: A | Discharge status: Home / Self Care | Payer: Self-pay | Source: Home / Self Care | Attending: Family Medicine

## 2014-03-08 ENCOUNTER — Encounter (HOSPITAL_COMMUNITY): Payer: Self-pay

## 2014-03-08 DIAGNOSIS — K633 Ulcer of intestine: Secondary | ICD-10-CM

## 2014-03-08 DIAGNOSIS — R1032 Left lower quadrant pain: Secondary | ICD-10-CM

## 2014-03-08 DIAGNOSIS — K625 Hemorrhage of anus and rectum: Secondary | ICD-10-CM

## 2014-03-08 DIAGNOSIS — K5792 Diverticulitis of intestine, part unspecified, without perforation or abscess without bleeding: Secondary | ICD-10-CM

## 2014-03-08 HISTORY — PX: COLONOSCOPY: SHX5424

## 2014-03-08 LAB — CBC
HCT: 37.4 % (ref 36.0–46.0)
HCT: 35.3 % — ABNORMAL LOW (ref 36.0–46.0)
HEMOGLOBIN: 12.4 g/dL (ref 12.0–15.0)
Hemoglobin: 11.7 g/dL — ABNORMAL LOW (ref 12.0–15.0)
MCH: 29.7 pg (ref 26.0–34.0)
MCH: 29.8 pg (ref 26.0–34.0)
MCHC: 33.2 g/dL (ref 30.0–36.0)
MCHC: 33.1 g/dL (ref 30.0–36.0)
MCV: 89.5 fL (ref 78.0–100.0)
MCV: 90.1 fL (ref 78.0–100.0)
PLATELETS: 260 10*3/uL (ref 150–400)
Platelets: 276 10*3/uL (ref 150–400)
RBC: 4.18 MIL/uL (ref 3.87–5.11)
RBC: 3.92 MIL/uL (ref 3.87–5.11)
RDW: 13.9 % (ref 11.5–15.5)
RDW: 14 % (ref 11.5–15.5)
WBC: 9.9 10*3/uL (ref 4.0–10.5)
WBC: 9.5 10*3/uL (ref 4.0–10.5)

## 2014-03-08 SURGERY — COLONOSCOPY
Anesthesia: Moderate Sedation

## 2014-03-08 MED ORDER — MIDAZOLAM HCL 5 MG/5ML IJ SOLN
INTRAMUSCULAR | Status: AC
  Filled 2014-03-08: qty 5

## 2014-03-08 MED ORDER — METRONIDAZOLE 250 MG PO TABS: 250.0000 mg | ORAL_TABLET | Freq: Three times a day (TID) | ORAL | Status: DC

## 2014-03-08 MED ORDER — MEPERIDINE HCL 100 MG/ML IJ SOLN
INTRAMUSCULAR | Status: AC
  Filled 2014-03-08: qty 1

## 2014-03-08 MED ORDER — SODIUM CHLORIDE 0.9 % IV SOLN: INTRAVENOUS | Status: DC

## 2014-03-08 MED ORDER — MIDAZOLAM HCL 5 MG/5ML IJ SOLN
INTRAMUSCULAR | Status: DC | PRN
  Administered 2014-03-08 (×2): 1 mg via INTRAVENOUS
  Administered 2014-03-08: 2 mg via INTRAVENOUS

## 2014-03-08 MED ORDER — CIPROFLOXACIN HCL 250 MG PO TABS: 250.0000 mg | ORAL_TABLET | Freq: Two times a day (BID) | ORAL | Status: DC

## 2014-03-08 MED ORDER — MEPERIDINE HCL 50 MG/ML IJ SOLN
INTRAMUSCULAR | Status: DC | PRN
  Administered 2014-03-08: 20 mg
  Administered 2014-03-08: 10 mg

## 2014-03-08 MED ORDER — CIPROFLOXACIN HCL 250 MG PO TABS
250.0000 mg | ORAL_TABLET | Freq: Two times a day (BID) | ORAL | Status: DC
  Administered 2014-03-08: 250 mg via ORAL
  Filled 2014-03-08: qty 1

## 2014-03-08 MED ORDER — METRONIDAZOLE 500 MG PO TABS
250.0000 mg | ORAL_TABLET | Freq: Three times a day (TID) | ORAL | Status: DC
  Administered 2014-03-08 (×2): 250 mg via ORAL
  Filled 2014-03-08 (×2): qty 1

## 2014-03-08 NOTE — Plan of Care (Signed)
Problem: Phase I Progression Outcomes Goal: Hemodynamically stable Outcome: Progressing Goal: Other Phase I Outcomes/Goals Outcome: Progressing

## 2014-03-08 NOTE — Discharge Summary (Signed)
Physician Discharge Summary  [name] [name] VQM:[id number] DOB: 04-Nov-1931 DOA: 03/07/2014  PCP: Redge Gainer, MD  Admit date: 03/07/2014 Discharge date: 03/08/2014  Time spent: >35 minutes  Recommendations for Outpatient Follow-up:  Please see discharge instructions below Reassess hemoglobin levels in the next 1 to 2 weeks We'll defer to primary care physician or gastroenterologist when to continue aspirin at this point will be held on discharge  Discharge Diagnoses:  Active Problems:   Hyperlipidemia   Anxiety   Rhinitis   History of total left hip arthroplasty   Rectal bleeding   BRBPR (bright red blood per rectum)   Discharge Condition: stable  Diet recommendation: Low residue diet  Filed Weights   03/07/14 0903 03/07/14 1208 03/08/14 0701  Weight: 69.4 kg (153 lb) 68.5 kg (151 lb 0.2 oz) 68.493 kg (151 lb)    History of present illness:  80 y/o with history of anxiety, depression, hyperlipidemia on aspirin the presented complaining of bright red blood per rectum.  Hospital Course:  BRBPR - Status post colonoscopy: Impression:  Few right-sided diverticula. Multiple ulcers at descending colon with features typical of ischemic colitis. Biopsy taken. Stool samples sent for cultures and O&P. Small external hemorrhoids.  We'll recommend low residue diet - Agree with ciprofloxacin and metronidazole for 1 week  Procedures: As listed above  Consultations:  Gastroenterology: [facility]  Discharge Exam: Filed Vitals:   03/08/14 1231  BP: 111/45  Pulse:   Temp:   Resp:     General: Pt in nad, alert and awake Cardiovascular: rrr, no mrg Respiratory: cta bl, no wheezes  Discharge Instructions You were cared for by a hospitalist during your hospital stay. If you have any questions about your discharge medications or the care you received while you were in the hospital after you are discharged, you can call the unit and asked to speak with the hospitalist  on call if the hospitalist that took care of you is not available. Once you are discharged, your primary care physician will handle any further medical issues. Please note that NO REFILLS for any discharge medications will be authorized once you are discharged, as it is imperative that you return to your primary care physician (or establish a relationship with a primary care physician if you do not have one) for your aftercare needs so that they can reassess your need for medications and monitor your lab values.  Discharge Instructions    Call MD for:  difficulty breathing, headache or visual disturbances    Complete by:  As directed      Call MD for:  severe uncontrolled pain    Complete by:  As directed      Call MD for:  temperature >100.4    Complete by:  As directed      Diet - low sodium heart healthy    Complete by:  As directed      Discharge instructions    Complete by:  As directed   Please reassured a follow-up with the gastroenterologist within the next 1-2 weeks after discharge. May follow-up see her should any acute issues arise.     Increase activity slowly    Complete by:  As directed           Current Discharge Medication List    START taking these medications   Details  ciprofloxacin (CIPRO) 250 MG tablet Take 1 tablet (250 mg total) by mouth 2 (two) times daily. Qty: 12 tablet, Refills: 0    metroNIDAZOLE (  FLAGYL) 250 MG tablet Take 1 tablet (250 mg total) by mouth every 8 (eight) hours. Qty: 18 tablet, Refills: 0      CONTINUE these medications which have NOT CHANGED   Details  ALPRAZolam (XANAX) 0.25 MG tablet Take one tablet by mouth twice daily for anxiety Qty: 60 tablet, Refills: 3    amLODipine (NORVASC) 5 MG tablet Take 5 mg by mouth daily.    calcium carbonate (OS-CAL) 600 MG TABS tablet Take 600 mg by mouth 2 (two) times daily with a meal.    cholecalciferol (VITAMIN D) 1000 UNITS tablet Take 2,000 Units by mouth daily.    fluticasone (FLONASE) 50  MCG/ACT nasal spray Place 1 spray into both nostrils daily.    lovastatin (MEVACOR) 40 MG tablet Take 20 mg by mouth at bedtime.    Omega-3 Fatty Acids (FISH OIL) 1000 MG CAPS Take 2 capsules by mouth daily.    traMADol (ULTRAM) 50 MG tablet Take one tablet by mouth twice daily as needed for pain Qty: 60 tablet, Refills: 5    citalopram (CELEXA) 20 MG tablet TAKE ONE TABLET BY MOUTH ONE TIME DAILY Qty: 30 tablet, Refills: 3    ondansetron (ZOFRAN ODT) 4 MG disintegrating tablet Take 1 tablet (4 mg total) by mouth every 8 (eight) hours as needed for nausea or vomiting. Qty: 20 tablet, Refills: 0   Associated Diagnoses: Noninfectious gastroenteritis, unspecified; Dehydration    promethazine (PHENERGAN) 12.5 MG tablet Take 1 tablet (12.5 mg total) by mouth every 8 (eight) hours as needed for nausea or vomiting. Qty: 20 tablet, Refills: 0      STOP taking these medications     aspirin EC 81 MG tablet        Allergies  Allergen Reactions  . Cephalexin     Can't remember  . Chlordiazepoxide-Clidinium     Can't remember  . Clarithromycin     Can't remember  . Erymax [Erythromycin]     Can't remember  . Evista [Raloxifene Hydrochloride]     Numb feeling  . Penicillins     Can't remember  . Sertraline Hcl Other (See Comments)    Sweating and mouth irritation   . Sulfa Antibiotics Nausea And Vomiting  . Betadine [Povidone Iodine] Rash  . Hydrogen Peroxide Rash      The results of significant diagnostics from this hospitalization (including imaging, microbiology, ancillary and laboratory) are listed below for reference.    Significant Diagnostic Studies: No results found.  Microbiology: No results found for this or any previous visit (from the past 240 hour(s)).   Labs: Basic Metabolic Panel:  Recent Labs Lab 03/07/14 0948 03/07/14 1245  NA 138  --   K 3.9  --   CL 99  --   CO2 26  --   GLUCOSE 180*  --   BUN 11  --   CREATININE 0.66  --   CALCIUM 9.8  --    MG  --  1.9  PHOS  --  3.7   Liver Function Tests:  Recent Labs Lab 03/07/14 0948  AST 18  ALT 13  ALKPHOS 88  BILITOT 0.5  PROT 7.5  ALBUMIN 3.8   No results for input(s): LIPASE, AMYLASE in the last 168 hours. No results for input(s): AMMONIA in the last 168 hours. CBC:  Recent Labs Lab 03/07/14 0948 03/07/14 1245 03/07/14 2006 03/08/14 0452 03/08/14 1210  WBC 10.7* 10.5 11.9* 9.9 9.5  NEUTROABS 8.7*  --   --   --   --  HGB 13.8 13.5 13.2 12.4 11.7*  HCT 42.5 41.7 39.5 37.4 35.3*  MCV 90.0 90.7 89.2 89.5 90.1  PLT 312 274 287 276 260   Cardiac Enzymes: No results for input(s): CKTOTAL, CKMB, CKMBINDEX, TROPONINI in the last 168 hours. BNP: BNP (last 3 results) No results for input(s): PROBNP in the last 8760 hours. CBG: No results for input(s): GLUCAP in the last 168 hours.     Signed:  Velvet Bathe  [facility] 03/08/2014, 12:49 PM

## 2014-03-08 NOTE — Progress Notes (Signed)
NURSING PROGRESS NOTE  [name] [name] [id number] Discharge Data: 03/08/2014 3:36 PM Attending Provider: No att. providers found OIP:PGFQM, Elenore Rota, MD   [name] to be D/C'd Home per MD order.    All IV's discontinued and monitored for bleeding.  All belongings returned to patient for patient to take home.  AVS summary and prescriptions reviewed with patient and spouse.   Patient left floor via wheelchair, escorted by NT.  Last Documented Vital Signs:  Blood pressure 111/45, pulse 80, temperature 98.2 F (36.8 C), temperature source Oral, resp. rate 18, height 5\' 2"  (1.575 m), weight 68.493 kg (151 lb), SpO2 98 %.  Cecilie Kicks D

## 2014-03-08 NOTE — Op Note (Signed)
COLONOSCOPY PROCEDURE REPORT  PATIENT:  [name] [name]  MR#:  [id number] Birthdate:  09/07/1931, 80 y.o., female Endoscopist:  Dr. Rogene Houston, MD Referred By:  Dr. Velvet Bathe, MD Procedure Date: 03/08/2014  Procedure:   Colonoscopy  Indications:  Patient is 80 year old Caucasian female who presents with rectal bleeding. She is noted to be mildly tender in left lower and left upper quadrant. She is undergoing diagnostic colonoscopy. Hemoglobin has strong by about gram and a half since admission but is still within normal limits.  Informed Consent:  The procedure and risks were reviewed with the patient and informed consent was obtained.  Medications:  Demerol 30 mg IV Versed 3 mg IV  Description of procedure:  After a digital rectal exam was performed, that colonoscope was advanced from the anus through the rectum and colon to the area of the cecum, ileocecal valve and appendiceal orifice. The cecum was deeply intubated. These structures were well-seen and photographed for the record. From the level of the cecum and ileocecal valve, the scope was slowly and cautiously withdrawn. The mucosal surfaces were carefully surveyed utilizing scope tip to flexion to facilitate fold flattening as needed. The scope was pulled down into the rectum where a thorough exam including retroflexion was performed.  Findings:   Prep satisfactory. Few small diverticula noted at descending colon and hepatic flexure. Multiple patchy ulcerations noted involving mucosa of descending colon with cyanotic bluish discoloration extending from splenic flexure to junction of descending and sigmoid colon. Stool sample was obtained for cultures and O&P followed by biopsy for routine histology. Mucosa of rest of the colon was normal. Small hemorrhoids below the dentate line.    Therapeutic/Diagnostic Maneuvers Performed:  See above  Complications:  None  Cecal Withdrawal Time:  12 minutes  Impression:  Few  right-sided diverticula. Multiple ulcers at descending colon with features typical of ischemic colitis. Biopsy taken. Stool samples sent for cultures and O&P. Small external hemorrhoids.  Recommendations:  Full liquid diet. Cipro 250 mg by mouth twice a day for 1 week. Metronidazole 250 mg by mouth 3 times a day for 1 week.  Izsak Meir U  03/08/2014 7:53 AM  CC: Dr. Redge Gainer, MD

## 2014-03-09 LAB — OVA AND PARASITE EXAMINATION

## 2014-03-12 ENCOUNTER — Encounter (HOSPITAL_COMMUNITY): Payer: Self-pay | Admitting: Internal Medicine

## 2014-03-12 LAB — STOOL CULTURE

## 2014-03-13 ENCOUNTER — Encounter: Payer: Medicare Other | Admitting: Physical Therapy

## 2014-03-15 ENCOUNTER — Ambulatory Visit: Payer: Medicare Other | Attending: Orthopedic Surgery | Admitting: Physical Therapy

## 2014-03-15 DIAGNOSIS — Z96642 Presence of left artificial hip joint: Secondary | ICD-10-CM | POA: Insufficient documentation

## 2014-03-15 DIAGNOSIS — M25559 Pain in unspecified hip: Secondary | ICD-10-CM | POA: Insufficient documentation

## 2014-03-15 DIAGNOSIS — Z5189 Encounter for other specified aftercare: Secondary | ICD-10-CM | POA: Diagnosis present

## 2014-03-15 DIAGNOSIS — R5381 Other malaise: Secondary | ICD-10-CM | POA: Diagnosis not present

## 2014-03-15 DIAGNOSIS — R260 Ataxic gait: Secondary | ICD-10-CM | POA: Insufficient documentation

## 2014-03-16 ENCOUNTER — Other Ambulatory Visit: Payer: Self-pay | Admitting: *Deleted

## 2014-03-16 ENCOUNTER — Ambulatory Visit (INDEPENDENT_AMBULATORY_CARE_PROVIDER_SITE_OTHER): Payer: Medicare Other

## 2014-03-16 ENCOUNTER — Encounter: Payer: Self-pay | Admitting: Family Medicine

## 2014-03-16 ENCOUNTER — Ambulatory Visit (INDEPENDENT_AMBULATORY_CARE_PROVIDER_SITE_OTHER): Payer: Medicare Other | Admitting: Family Medicine

## 2014-03-16 VITALS — BP 114/59 | HR 81 | Temp 96.7°F | Ht 62.0 in | Wt 151.8 lb

## 2014-03-16 DIAGNOSIS — R05 Cough: Secondary | ICD-10-CM

## 2014-03-16 DIAGNOSIS — R059 Cough, unspecified: Secondary | ICD-10-CM

## 2014-03-16 DIAGNOSIS — J449 Chronic obstructive pulmonary disease, unspecified: Secondary | ICD-10-CM

## 2014-03-16 DIAGNOSIS — IMO0001 Reserved for inherently not codable concepts without codable children: Secondary | ICD-10-CM

## 2014-03-16 MED ORDER — HYDROCODONE-HOMATROPINE 5-1.5 MG/5ML PO SYRP: ORAL_SOLUTION | ORAL | Status: DC

## 2014-03-16 NOTE — Progress Notes (Signed)
   Subjective:    Patient ID: [name] [name], female    DOB: 11-13-1931, 80 y.o.   MRN: [id number]  HPI  80 yr old female with productive cough.  Hospitalized last week for GI bleed and recovering from hip replacement.  Hx COPD. No fever, chills.   Review of Systems  Constitutional: Negative.   HENT: Negative.   Eyes: Negative.   Respiratory: Positive for cough and wheezing.   Cardiovascular: Negative.   Gastrointestinal: Negative.   Endocrine: Negative.   Genitourinary: Negative.   Hematological: Negative.   Psychiatric/Behavioral: Negative.        Objective:   Physical Exam  Pulmonary/Chest: Effort normal. She has wheezes.  Rhonchi bilaterally          Assessment & Plan:  1. COPD bronchitis CXR shows no infiltrate - DG Chest 2 View; Future  2. Cough  - DG Chest 2 View; Future

## 2014-03-19 ENCOUNTER — Other Ambulatory Visit (HOSPITAL_COMMUNITY): Payer: Self-pay | Admitting: Orthopedic Surgery

## 2014-03-19 ENCOUNTER — Ambulatory Visit (HOSPITAL_COMMUNITY)
Admission: RE | Admit: 2014-03-19 | Discharge: 2014-03-19 | Disposition: A | Discharge status: Home / Self Care | Payer: Medicare Other | Source: Ambulatory Visit | Attending: Orthopedic Surgery | Admitting: Orthopedic Surgery

## 2014-03-19 DIAGNOSIS — M7989 Other specified soft tissue disorders: Secondary | ICD-10-CM

## 2014-03-19 DIAGNOSIS — R609 Edema, unspecified: Secondary | ICD-10-CM

## 2014-03-19 DIAGNOSIS — M25562 Pain in left knee: Secondary | ICD-10-CM

## 2014-03-19 MED ORDER — HYDROCODONE-HOMATROPINE 5-1.5 MG/5ML PO SYRP: ORAL_SOLUTION | ORAL | Status: DC

## 2014-03-19 NOTE — Progress Notes (Signed)
VASCULAR LAB PRELIMINARY  PRELIMINARY  PRELIMINARY  PRELIMINARY  Left lower extremity venous duplex completed.    Preliminary report:  Left:  No evidence of DVT, superficial thrombosis, or Baker's cyst.  Cleon Thoma, RVT 03/19/2014, 5:03 PM

## 2014-03-20 ENCOUNTER — Ambulatory Visit: Payer: Medicare Other | Admitting: Physical Therapy

## 2014-03-20 DIAGNOSIS — Z5189 Encounter for other specified aftercare: Secondary | ICD-10-CM | POA: Diagnosis not present

## 2014-03-22 ENCOUNTER — Encounter (INDEPENDENT_AMBULATORY_CARE_PROVIDER_SITE_OTHER): Payer: Self-pay | Admitting: Internal Medicine

## 2014-03-22 ENCOUNTER — Ambulatory Visit (INDEPENDENT_AMBULATORY_CARE_PROVIDER_SITE_OTHER): Payer: Medicare Other | Admitting: Internal Medicine

## 2014-03-22 ENCOUNTER — Ambulatory Visit: Payer: Medicare Other | Admitting: Physical Therapy

## 2014-03-22 VITALS — BP 110/80 | HR 90 | Temp 98.6°F | Resp 18 | Ht 62.0 in | Wt 150.9 lb

## 2014-03-22 DIAGNOSIS — K559 Vascular disorder of intestine, unspecified: Secondary | ICD-10-CM

## 2014-03-22 DIAGNOSIS — Z5189 Encounter for other specified aftercare: Secondary | ICD-10-CM | POA: Diagnosis not present

## 2014-03-22 NOTE — Patient Instructions (Signed)
Resume usual diet. Probiotic 1 capsule by mouth daily for 30 days. Call if you have abdominal pain or rectal bleeding

## 2014-03-22 NOTE — Progress Notes (Signed)
Presenting complaint;  Follow-up for ischemic colitis.  Database;  Patient is 80 year old Caucasian female who was admitted at [facility] about 2 weeks ago for rectal bleeding and abdominal pain. She underwent colonoscopy on 03/08/2014 revealing left-sided colitis insistent with ischemic injury. Stool culture and O&P were negative. She was treated with Cipro and metronidazole. Colon biopsy confirmed diagnosis of ischemic colitis. Patient was advised to hold aspirin until office visit. Colonoscopy also revealed a few right-sided diverticula.  Subjective:  Patient presents with her husband. Since recent hospitalization she developed upper respirator tract infection and was seen by Dr. Sabra Heck and diagnosed with bronchitis and begun on doxycycline. She will take last dose tomorrow. Patient states cough has resolved. Patient is on low residue diet. She had formed stool this morning. Yesterday she had diarrhea. She denies abdominal pain or rectal bleeding. She has good appetite.   Current Medications: Outpatient Encounter Prescriptions as of 03/22/2014  Medication Sig  . ALPRAZolam (XANAX) 0.25 MG tablet Take one tablet by mouth twice daily for anxiety (Patient taking differently: Take 0.25 mg by mouth 2 (two) times daily as needed for anxiety. Take one tablet by mouth twice daily for anxiety)  . amLODipine (NORVASC) 5 MG tablet Take 5 mg by mouth daily.  . calcium carbonate (OS-CAL) 600 MG TABS tablet Take 600 mg by mouth 2 (two) times daily with a meal.  . cholecalciferol (VITAMIN D) 1000 UNITS tablet Take 2,000 Units by mouth daily.  Marland Kitchen doxycycline (VIBRA-TABS) 100 MG tablet Take 100 mg by mouth 2 (two) times daily. Patient states that she will finish 03/23/14.  . fluticasone (FLONASE) 50 MCG/ACT nasal spray Place 1 spray into both nostrils daily.  Marland Kitchen HYDROcodone-homatropine (HYCODAN) 5-1.5 MG/5ML syrup Take 1 tsp every 4-6 hours for cough and congstion  . lovastatin (MEVACOR) 40 MG tablet Take 20 mg by  mouth at bedtime.  . Omega-3 Fatty Acids (FISH OIL) 1000 MG CAPS Take 2 capsules by mouth daily.  . [DISCONTINUED] ondansetron (ZOFRAN ODT) 4 MG disintegrating tablet Take 1 tablet (4 mg total) by mouth every 8 (eight) hours as needed for nausea or vomiting. (Patient not taking: Reported on 03/07/2014)  . [DISCONTINUED] promethazine (PHENERGAN) 12.5 MG tablet Take 1 tablet (12.5 mg total) by mouth every 8 (eight) hours as needed for nausea or vomiting. (Patient not taking: Reported on 03/07/2014)     Objective: Blood pressure 110/80, pulse 90, temperature 98.6 F (37 C), temperature source Oral, resp. rate 18, height 5\' 2"  (1.575 m), weight 150 lb 14.4 oz (68.448 kg). Patient is alert and in no acute distress. She is ambulating using cane. Conjunctiva is pink. Sclera is nonicteric Oropharyngeal mucosa is normal. No neck masses or thyromegaly noted. Cardiac exam with regular rhythm normal S1 and S2. No murmur or gallop noted. Lungs are clear to auscultation. Abdomen is full. It is soft and nontender without organomegaly or masses.  No LE edema or clubbing noted.   Assessment:  #1. Ischemic colitis. Abdominal pain and rectal bleeding has resolved. She has had intermittent diarrhea which may be due to doxycycline. Patient does not have underlying risk factors. Therefore would not recommend further workup unless condition relapses.   Plan:  Resume regular diet. Probiotic one capsule daily for one month. Resume aspirin at dose of 81 mg by mouth daily. Patient will call if symptoms relapse in which case we will proceed with further workup including CT abdomen. Office visit when necessary.

## 2014-03-27 ENCOUNTER — Ambulatory Visit: Payer: Medicare Other | Admitting: *Deleted

## 2014-03-27 DIAGNOSIS — Z5189 Encounter for other specified aftercare: Secondary | ICD-10-CM | POA: Diagnosis not present

## 2014-03-29 ENCOUNTER — Ambulatory Visit: Payer: Medicare Other | Admitting: *Deleted

## 2014-03-29 DIAGNOSIS — Z5189 Encounter for other specified aftercare: Secondary | ICD-10-CM | POA: Diagnosis not present

## 2014-04-03 ENCOUNTER — Ambulatory Visit: Payer: Medicare Other | Admitting: Physical Therapy

## 2014-04-03 DIAGNOSIS — Z5189 Encounter for other specified aftercare: Secondary | ICD-10-CM | POA: Diagnosis not present

## 2014-04-10 ENCOUNTER — Ambulatory Visit: Payer: Medicare Other | Admitting: Physical Therapy

## 2014-04-10 DIAGNOSIS — Z5189 Encounter for other specified aftercare: Secondary | ICD-10-CM | POA: Diagnosis not present

## 2014-04-17 ENCOUNTER — Ambulatory Visit: Payer: Medicare Other | Attending: Orthopedic Surgery | Admitting: Physical Therapy

## 2014-04-17 DIAGNOSIS — M25559 Pain in unspecified hip: Secondary | ICD-10-CM | POA: Insufficient documentation

## 2014-04-17 DIAGNOSIS — Z5189 Encounter for other specified aftercare: Secondary | ICD-10-CM | POA: Insufficient documentation

## 2014-04-17 DIAGNOSIS — R260 Ataxic gait: Secondary | ICD-10-CM | POA: Insufficient documentation

## 2014-04-17 DIAGNOSIS — R5381 Other malaise: Secondary | ICD-10-CM | POA: Diagnosis not present

## 2014-04-17 DIAGNOSIS — Z96642 Presence of left artificial hip joint: Secondary | ICD-10-CM | POA: Diagnosis not present

## 2014-04-18 ENCOUNTER — Other Ambulatory Visit (INDEPENDENT_AMBULATORY_CARE_PROVIDER_SITE_OTHER): Payer: Medicare Other

## 2014-04-18 DIAGNOSIS — R61 Generalized hyperhidrosis: Secondary | ICD-10-CM

## 2014-04-18 DIAGNOSIS — R739 Hyperglycemia, unspecified: Secondary | ICD-10-CM

## 2014-04-18 DIAGNOSIS — R5383 Other fatigue: Secondary | ICD-10-CM

## 2014-04-18 DIAGNOSIS — F419 Anxiety disorder, unspecified: Secondary | ICD-10-CM

## 2014-04-18 DIAGNOSIS — R7309 Other abnormal glucose: Secondary | ICD-10-CM

## 2014-04-18 DIAGNOSIS — J449 Chronic obstructive pulmonary disease, unspecified: Secondary | ICD-10-CM

## 2014-04-18 LAB — POCT CBC
GRANULOCYTE PERCENT: 69.5 % (ref 37–80)
HCT, POC: 43.7 % (ref 37.7–47.9)
Hemoglobin: 13.2 g/dL (ref 12.2–16.2)
LYMPH, POC: 1.9 (ref 0.6–3.4)
MCH: 26.4 pg — AB (ref 27–31.2)
MCHC: 30.1 g/dL — AB (ref 31.8–35.4)
MCV: 87.5 fL (ref 80–97)
MPV: 9.6 fL (ref 0–99.8)
POC GRANULOCYTE: 5.1 (ref 2–6.9)
POC LYMPH PERCENT: 26.4 %L (ref 10–50)
Platelet Count, POC: 271 10*3/uL (ref 142–424)
RBC: 5 M/uL (ref 4.04–5.48)
RDW, POC: 15.6 %
WBC: 7.3 10*3/uL (ref 4.6–10.2)

## 2014-04-18 LAB — POCT GLYCOSYLATED HEMOGLOBIN (HGB A1C): Hemoglobin A1C: 6.7

## 2014-04-18 NOTE — Progress Notes (Signed)
Lab only 

## 2014-04-19 ENCOUNTER — Encounter: Payer: Medicare Other | Admitting: Physical Therapy

## 2014-04-19 LAB — NMR, LIPOPROFILE
Cholesterol: 157 mg/dL (ref 100–199)
HDL CHOLESTEROL BY NMR: 50 mg/dL (ref >39)
HDL Particle Number: 26.6 umol/L — ABNORMAL LOW (ref >=30.5)
LDL PARTICLE NUMBER: 816 nmol/L (ref <1000)
LDL Size: 20.5 nm (ref >20.5)
LDL-C: 90 mg/dL (ref 0–99)
LP-IR Score: 37 (ref <=45)
Small LDL Particle Number: 272 nmol/L (ref <=527)
TRIGLYCERIDES BY NMR: 86 mg/dL (ref 0–149)

## 2014-04-19 LAB — BMP8+EGFR
BUN/Creatinine Ratio: 17 (ref 11–26)
BUN: 13 mg/dL (ref 8–27)
CALCIUM: 10.1 mg/dL (ref 8.7–10.3)
CO2: 26 mmol/L (ref 18–29)
CREATININE: 0.78 mg/dL (ref 0.57–1.00)
Chloride: 101 mmol/L (ref 97–108)
GFR calc Af Amer: 83 mL/min/{1.73_m2} (ref >59)
GFR, EST NON AFRICAN AMERICAN: 72 mL/min/{1.73_m2} (ref >59)
Glucose: 127 mg/dL — ABNORMAL HIGH (ref 65–99)
Potassium: 5.1 mmol/L (ref 3.5–5.2)
SODIUM: 142 mmol/L (ref 134–144)

## 2014-04-19 LAB — HEPATIC FUNCTION PANEL
ALT: 12 IU/L (ref 0–32)
AST: 20 IU/L (ref 0–40)
Albumin: 4.1 g/dL (ref 3.5–4.7)
Alkaline Phosphatase: 82 IU/L (ref 39–117)
Bilirubin, Direct: 0.12 mg/dL (ref 0.00–0.40)
Total Bilirubin: 0.5 mg/dL (ref 0.0–1.2)
Total Protein: 6.7 g/dL (ref 6.0–8.5)

## 2014-04-19 LAB — VITAMIN D 25 HYDROXY (VIT D DEFICIENCY, FRACTURES): Vit D, 25-Hydroxy: 37.5 ng/mL (ref 30.0–100.0)

## 2014-04-24 ENCOUNTER — Encounter: Payer: Self-pay | Admitting: Family Medicine

## 2014-04-24 ENCOUNTER — Ambulatory Visit (INDEPENDENT_AMBULATORY_CARE_PROVIDER_SITE_OTHER): Payer: Medicare Other | Admitting: Family Medicine

## 2014-04-24 VITALS — BP 108/63 | HR 81 | Temp 97.6°F | Ht 62.0 in | Wt 151.0 lb

## 2014-04-24 DIAGNOSIS — J449 Chronic obstructive pulmonary disease, unspecified: Secondary | ICD-10-CM

## 2014-04-24 DIAGNOSIS — I10 Essential (primary) hypertension: Secondary | ICD-10-CM

## 2014-04-24 DIAGNOSIS — R531 Weakness: Secondary | ICD-10-CM

## 2014-04-24 DIAGNOSIS — Z96642 Presence of left artificial hip joint: Secondary | ICD-10-CM

## 2014-04-24 DIAGNOSIS — Z8719 Personal history of other diseases of the digestive system: Secondary | ICD-10-CM

## 2014-04-24 DIAGNOSIS — Z9660 Presence of unspecified orthopedic joint implant: Secondary | ICD-10-CM

## 2014-04-24 NOTE — Patient Instructions (Addendum)
Medicare Annual Wellness Visit  [facility] and the medical providers at [facility] strive to bring you the best medical care.  In doing so we not only want to address your current medical conditions and concerns but also to detect new conditions early and prevent illness, disease and health-related problems.    Medicare offers a yearly Wellness Visit which allows our clinical staff to assess your need for preventative services including immunizations, lifestyle education, counseling to decrease risk of preventable diseases and screening for fall risk and other medical concerns.    This visit is provided free of charge (no copay) for all Medicare recipients. The clinical pharmacists at [facility] have begun to conduct these Wellness Visits which will also include a thorough review of all your medications.    As you primary medical provider recommend that you make an appointment for your Annual Wellness Visit if you have not done so already this year.  You may set up this appointment before you leave today or you may call back ([phone]) and schedule an appointment.  Please make sure when you call that you mention that you are scheduling your Annual Wellness Visit with the clinical pharmacist so that the appointment may be made for the proper length of time.     Continue current medications. Continue good therapeutic lifestyle changes which include good diet and exercise. Fall precautions discussed with patient. If an FOBT was given today- please return it to our front desk. If you are over 36 years old - you may need Prevnar 51 or the adult Pneumonia vaccine.  Flu Shots will be available at our office starting mid- September. Please call and schedule a FLU CLINIC APPOINTMENT.   Continue with home physical therapy Continue to be careful and do not put yourself at risk for falling Continue to drink plenty of fluids

## 2014-04-24 NOTE — Progress Notes (Signed)
Subjective:    Patient ID: [name] [name], female    DOB: [birth year], 81 y.o.   MRN: [id number]  HPI Pt here for follow up and management of chronic medical problems which include hypertension and COPD. the patient continues to do well and she continues to recover from her  hip replacement and another hospitalization for a GI bleed. She is regaining her strength and is walking and exercising on her own. This was confirmed by her husband. We will review with her her recent lab work and she will be given an FOBT to return. She brings in home blood pressures for review and all of these are good. It is important to note that on her lab work her fasting blood sugar was 127 and her hemoglobin A1c was 6.7%. She is currently not taking any medication for her blood sugar. The hemoglobin was good at her cholesterol numbers were excellent her liver and kidney function tests were good.       Patient Active Problem List   Diagnosis Date Noted  . Rectal bleeding 03/07/2014  . BRBPR (bright red blood per rectum) 03/07/2014  . History of total left hip arthroplasty 01/25/2014  . Elevated liver enzymes 01/13/2014  . Edema 01/02/2014  . Hypoxia 01/02/2014  . S/P total hip arthroplasty 12/27/2013  . Chronic cough 10/26/2013  . Hyperlipidemia   . Anxiety   . Depressive disorder, not elsewhere classified   . Rhinitis   . Elevated blood sugar   . Atrophic vaginitis   . Postmenopausal    Outpatient Encounter Prescriptions as of 04/24/2014  Medication Sig  . ALPRAZolam (XANAX) 0.25 MG tablet Take one tablet by mouth twice daily for anxiety (Patient taking differently: Take 0.25 mg by mouth 2 (two) times daily as needed for anxiety. Take one tablet by mouth twice daily for anxiety)  . amLODipine (NORVASC) 5 MG tablet Take 2.5 mg by mouth daily.   Marland Kitchen aspirin 81 MG tablet Take 81 mg by mouth daily.  . calcium carbonate (OS-CAL) 600 MG TABS tablet Take 600 mg by mouth 2 (two) times daily with a meal.  .  cholecalciferol (VITAMIN D) 1000 UNITS tablet Take 2,000 Units by mouth daily.  Marland Kitchen doxycycline (VIBRA-TABS) 100 MG tablet Take 100 mg by mouth 2 (two) times daily. Patient states that she will finish 03/23/14.  . fluticasone (FLONASE) 50 MCG/ACT nasal spray Place 1 spray into both nostrils daily.  Marland Kitchen HYDROcodone-homatropine (HYCODAN) 5-1.5 MG/5ML syrup Take 1 tsp every 4-6 hours for cough and congstion  . lovastatin (MEVACOR) 40 MG tablet Take 20 mg by mouth at bedtime.  . Omega-3 Fatty Acids (FISH OIL) 1000 MG CAPS Take 2 capsules by mouth daily.    Review of Systems  Constitutional: Negative.   HENT: Negative.   Eyes: Negative.   Respiratory: Negative.   Cardiovascular: Negative.   Gastrointestinal: Negative.   Endocrine: Negative.   Genitourinary: Negative.   Musculoskeletal: Negative.   Skin: Negative.   Allergic/Immunologic: Negative.   Neurological: Negative.   Hematological: Negative.   Psychiatric/Behavioral: Negative.        Objective:   Physical Exam  Constitutional: She is oriented to person, place, and time. No distress.  The patient appears somewhat thin and older appearing than her stated age. She still continues to recovery from her left hip replacement and her GI bleed.  HENT:  Head: Normocephalic and atraumatic.  Right Ear: External ear normal.  Left Ear: External ear normal.  Nose: Nose normal.  Mouth/Throat:  Oropharynx is clear and moist. No oropharyngeal exudate.  Eyes: Conjunctivae and EOM are normal. Pupils are equal, round, and reactive to light. Right eye exhibits no discharge. Left eye exhibits no discharge. No scleral icterus.  Neck: Normal range of motion. Neck supple. No JVD present. No thyromegaly present.  Thyromegaly  Cardiovascular: Normal rate, regular rhythm, normal heart sounds and intact distal pulses.  Exam reveals no gallop and no friction rub.   No murmur heard. At 72/m  Pulmonary/Chest: Effort normal. No respiratory distress. She has no  wheezes. She has no rales. She exhibits no tenderness.  Generalized decreased breath sounds but no wheezes or rhonchi  Abdominal: Soft. Bowel sounds are normal. She exhibits no mass. There is no tenderness. There is no rebound and no guarding.  Musculoskeletal: Normal range of motion. She exhibits no edema or tenderness.  Range of motion is a little more hesitant with her left hip.  Lymphadenopathy:    She has no cervical adenopathy.  Neurological: She is alert and oriented to person, place, and time. She has normal reflexes. No cranial nerve deficit.  Skin: Skin is warm and dry. No rash noted. There is pallor.  Psychiatric: She has a normal mood and affect. Her behavior is normal. Judgment and thought content normal.  Nursing note and vitals reviewed.  BP 108/63 mmHg  Pulse 81  Temp(Src) 97.6 F (36.4 C) (Oral)  Ht 5\' 2"  (1.575 m)  Wt 151 lb (68.493 kg)  BMI 27.61 kg/m2        Assessment & Plan:  1. COPD with chronic bronchitis -Continue with fluticasone and as needed pulmonary inhalers.  2. Essential hypertension -Continue with amlodipine 2.5 mg daily and sodium restriction.  3. Status post left hip replacement -Continue with home physical therapy as prescribed by the orthopedic surgeon  4. History of GI bleed -Continue to monitor for any further bleeding issues.  5. Weakness -Continue physical therapy at home.  Patient Instructions                       Medicare Annual Wellness Visit  [facility] and the medical providers at [facility] strive to bring you the best medical care.  In doing so we not only want to address your current medical conditions and concerns but also to detect new conditions early and prevent illness, disease and health-related problems.    Medicare offers a yearly Wellness Visit which allows our clinical staff to assess your need for preventative services including immunizations, lifestyle education, counseling to decrease  risk of preventable diseases and screening for fall risk and other medical concerns.    This visit is provided free of charge (no copay) for all Medicare recipients. The clinical pharmacists at [facility] have begun to conduct these Wellness Visits which will also include a thorough review of all your medications.    As you primary medical provider recommend that you make an appointment for your Annual Wellness Visit if you have not done so already this year.  You may set up this appointment before you leave today or you may call back ([phone]) and schedule an appointment.  Please make sure when you call that you mention that you are scheduling your Annual Wellness Visit with the clinical pharmacist so that the appointment may be made for the proper length of time.     Continue current medications. Continue good therapeutic lifestyle changes which include good diet and exercise. Fall precautions discussed  with patient. If an FOBT was given today- please return it to our front desk. If you are over 69 years old - you may need Prevnar 39 or the adult Pneumonia vaccine.  Flu Shots will be available at our office starting mid- September. Please call and schedule a FLU CLINIC APPOINTMENT.   Continue with home physical therapy Continue to be careful and do not put yourself at risk for falling Continue to drink plenty of fluids   Arrie Senate MD

## 2014-04-25 ENCOUNTER — Other Ambulatory Visit (INDEPENDENT_AMBULATORY_CARE_PROVIDER_SITE_OTHER): Payer: Medicare Other

## 2014-04-25 DIAGNOSIS — Z1212 Encounter for screening for malignant neoplasm of rectum: Secondary | ICD-10-CM

## 2014-04-25 NOTE — Progress Notes (Signed)
Lab only 

## 2014-04-27 LAB — FECAL OCCULT BLOOD, IMMUNOCHEMICAL: FECAL OCCULT BLD: NEGATIVE

## 2014-05-15 ENCOUNTER — Encounter: Payer: Self-pay | Admitting: *Deleted

## 2014-05-28 ENCOUNTER — Other Ambulatory Visit: Payer: Self-pay | Admitting: Family Medicine

## 2014-05-29 NOTE — Telephone Encounter (Signed)
This is okay to refill this medication for 3 months 

## 2014-05-29 NOTE — Telephone Encounter (Signed)
Last seen 04/24/14 DWM  If approved route to nurse to call into [facility]

## 2014-06-19 ENCOUNTER — Telehealth: Payer: Self-pay | Admitting: Family Medicine

## 2014-06-19 NOTE — Telephone Encounter (Signed)
Can we refer her to dermatology for place on neck or do you want to see her?

## 2014-06-19 NOTE — Telephone Encounter (Signed)
WhatEver patient prefers. A provider can see her here or she can be referred to a dermatologist

## 2014-06-19 NOTE — Telephone Encounter (Signed)
Appointment given for 3/14 with Laurance Flatten.

## 2014-06-25 ENCOUNTER — Encounter: Payer: Self-pay | Admitting: Family Medicine

## 2014-06-25 ENCOUNTER — Ambulatory Visit (INDEPENDENT_AMBULATORY_CARE_PROVIDER_SITE_OTHER): Payer: Medicare Other | Admitting: Family Medicine

## 2014-06-25 VITALS — BP 119/71 | HR 66 | Temp 97.1°F | Ht 62.0 in | Wt 152.0 lb

## 2014-06-25 DIAGNOSIS — L739 Follicular disorder, unspecified: Secondary | ICD-10-CM

## 2014-06-25 DIAGNOSIS — L853 Xerosis cutis: Secondary | ICD-10-CM

## 2014-06-25 DIAGNOSIS — L850 Acquired ichthyosis: Secondary | ICD-10-CM

## 2014-06-25 DIAGNOSIS — L03211 Cellulitis of face: Secondary | ICD-10-CM | POA: Diagnosis not present

## 2014-06-25 MED ORDER — DOXYCYCLINE HYCLATE 100 MG PO TABS: 100.0000 mg | ORAL_TABLET | Freq: Two times a day (BID) | ORAL | Status: DC

## 2014-06-25 MED ORDER — AMLODIPINE BESYLATE 2.5 MG PO TABS: 2.5000 mg | ORAL_TABLET | Freq: Every day | ORAL | Status: DC

## 2014-06-25 NOTE — Progress Notes (Signed)
Subjective:    Patient ID: [name] [name], female    DOB: 1932/02/04, 81 y.o.   MRN: [id number]  HPI Patient here today to check on a place on her left jaw line. She also has several other lesions she wants [place] to look at. The patient is doing well except for the skin issues over her body but especially 1 and her left mandible. She has decided not to go back to work. At home she uses Tide detergent and downy fabric softener and Dove soap. The skin lesion at the mandible on the left side has been there for several weeks and appears to be healing but is still pruritic. She also has lesions on her legs and trunk and arms these are not the same as the Place on her left mandible. Her skin is dry         Patient Active Problem List   Diagnosis Date Noted  . Rectal bleeding 03/07/2014  . BRBPR (bright red blood per rectum) 03/07/2014  . History of total left hip arthroplasty 01/25/2014  . Elevated liver enzymes 01/13/2014  . Edema 01/02/2014  . Hypoxia 01/02/2014  . S/P total hip arthroplasty 12/27/2013  . Chronic cough 10/26/2013  . Hyperlipidemia   . Anxiety   . Depressive disorder, not elsewhere classified   . Rhinitis   . Elevated blood sugar   . Atrophic vaginitis   . Postmenopausal    Outpatient Encounter Prescriptions as of 06/25/2014  Medication Sig  . ALPRAZolam (XANAX) 0.25 MG tablet TAKE ONE TABLET BY MOUTH TWICE DAILY AS NEEDED FOR ANXIETY  . amLODipine (NORVASC) 5 MG tablet Take 2.5 mg by mouth daily.   Marland Kitchen aspirin 81 MG tablet Take 81 mg by mouth daily.  . calcium carbonate (OS-CAL) 600 MG TABS tablet Take 600 mg by mouth 2 (two) times daily with a meal.  . cholecalciferol (VITAMIN D) 1000 UNITS tablet Take 2,000 Units by mouth daily.  . fluticasone (FLONASE) 50 MCG/ACT nasal spray Place 1 spray into both nostrils daily.  Marland Kitchen HYDROcodone-homatropine (HYCODAN) 5-1.5 MG/5ML syrup Take 1 tsp every 4-6 hours for cough and congstion  . lovastatin (MEVACOR) 40 MG tablet Take 20  mg by mouth at bedtime.  . Omega-3 Fatty Acids (FISH OIL) 1000 MG CAPS Take 2 capsules by mouth daily.  . [DISCONTINUED] doxycycline (VIBRA-TABS) 100 MG tablet Take 100 mg by mouth 2 (two) times daily. Patient states that she will finish 03/23/14.    Review of Systems  Constitutional: Negative.   HENT: Negative.   Eyes: Negative.   Respiratory: Negative.   Cardiovascular: Negative.   Gastrointestinal: Negative.   Endocrine: Negative.   Genitourinary: Negative.   Musculoskeletal: Negative.   Skin: Negative.        Skin lesions - several on body, one on jaw line - left  Allergic/Immunologic: Negative.   Neurological: Negative.   Hematological: Negative.   Psychiatric/Behavioral: Negative.        Objective:   Physical Exam  Constitutional: She is oriented to person, place, and time. She appears well-developed and well-nourished. No distress.  HENT:  Head: Normocephalic.  Eyes: Conjunctivae and EOM are normal. Pupils are equal, round, and reactive to light. Right eye exhibits no discharge. Left eye exhibits no discharge. No scleral icterus.  Neck: Normal range of motion. Neck supple.  Musculoskeletal: Normal range of motion.  Neurological: She is alert and oriented to person, place, and time.  Skin: Skin is warm and dry. Rash noted. There is erythema.  No pallor.  Scattered punctate areas of erythema on extremities and trunk, healing abscess left mandible, dry skin  Psychiatric: She has a normal mood and affect. Her behavior is normal. Judgment and thought content normal.  Nursing note and vitals reviewed.   BP 119/71 mmHg  Pulse 66  Temp(Src) 97.1 F (36.2 C) (Oral)  Ht 5\' 2"  (1.575 m)  Wt 152 lb (68.947 kg)  BMI 27.79 kg/m2       Assessment & Plan:  1. Folliculitis -Take antibiotic as directed  2. Dry skin dermatitis -Use scent free detergents, fabric softeners and soaps  3. Cellulitis of face -Take antibiotic as directed  Meds ordered this encounter    Medications  . amLODipine (NORVASC) 2.5 MG tablet    Sig: Take 1 tablet (2.5 mg total) by mouth daily.    Dispense:  90 tablet    Refill:  2  . doxycycline (VIBRA-TABS) 100 MG tablet    Sig: Take 1 tablet (100 mg total) by mouth 2 (two) times daily.    Dispense:  20 tablet    Refill:  0   Patient Instructions  The patient should use only fabric softeners that are scent-free She should changed the detergent to All, dermatology recommended She should take the antibiotic as directed at least for 10-14 days. She can use a little bit of cortisone 10 to the right neck sparingly twice daily for 7 days for itching. Use a cool mist humidifier in the home and keep the house as cool as possible specimen in the winter months. I cannot logically, try to get out and get more active. Increase activity will be good for you and will help relax you.   Arrie Senate MD

## 2014-06-25 NOTE — Patient Instructions (Addendum)
The patient should use only fabric softeners that are scent-free She should changed the detergent to All, dermatology recommended She should take the antibiotic as directed at least for 10-14 days. She can use a little bit of cortisone 10 to the right neck sparingly twice daily for 7 days for itching. Use a cool mist humidifier in the home and keep the house as cool as possible specimen in the winter months. psychologically, try to get out and get more active. Increase activity will be good for you and will help relax you.

## 2014-07-02 ENCOUNTER — Telehealth: Payer: Self-pay | Admitting: Family Medicine

## 2014-07-02 NOTE — Telephone Encounter (Signed)
Appointment scheduled for tomorrow at 2:45 with Laurance Flatten.

## 2014-07-03 ENCOUNTER — Ambulatory Visit (INDEPENDENT_AMBULATORY_CARE_PROVIDER_SITE_OTHER): Payer: Medicare Other | Admitting: Family Medicine

## 2014-07-03 ENCOUNTER — Encounter: Payer: Self-pay | Admitting: Family Medicine

## 2014-07-03 VITALS — BP 113/64 | HR 73 | Temp 96.8°F | Ht 62.0 in | Wt 152.0 lb

## 2014-07-03 DIAGNOSIS — F4322 Adjustment disorder with anxiety: Secondary | ICD-10-CM | POA: Diagnosis not present

## 2014-07-03 DIAGNOSIS — L309 Dermatitis, unspecified: Secondary | ICD-10-CM | POA: Diagnosis not present

## 2014-07-03 NOTE — Patient Instructions (Addendum)
We will arrange for you to have an appointment with Dr. Nevada Crane in reasonable to follow-up on the facial skin infection. Finish the current antibiotic, then start Keflex 500 mg 3 times a day. When you go to see Dr. Nevada Crane, please take the empty bottle of doxycycline with you to see him so that he will know what you have been taking. Also increase the Xanax to 1 twice a day and one half in the middle of the day to calm you down

## 2014-07-03 NOTE — Addendum Note (Signed)
Addended by: Zannie Cove on: 07/03/2014 03:43 PM   Modules accepted: Orders

## 2014-07-03 NOTE — Progress Notes (Signed)
Subjective:    Patient ID: [name] [name], female    DOB: [birth year], 81 y.o.   MRN: [id number]  HPI  Patient here today for rash on face that is not getting better and she also complains of anxiety. She states she is constantly scratching her skin. The patient is aware that she is extremely anxious and has her hands on her face almost constantly and that she can't control this. I can't say that the rash looks any worse by cancer that it looks any better after almost completing this course of doxycycline. She has changed soaps or fabric softeners and detergents. She still has sores on her face which almost appear like impetigo. The patient is allergic to multiple antibiotics including penicillin cephalexin clarithromycin and sulfa.          Patient Active Problem List   Diagnosis Date Noted  . Rectal bleeding 03/07/2014  . BRBPR (bright red blood per rectum) 03/07/2014  . History of total left hip arthroplasty 01/25/2014  . Elevated liver enzymes 01/13/2014  . Edema 01/02/2014  . Hypoxia 01/02/2014  . S/P total hip arthroplasty 12/27/2013  . Chronic cough 10/26/2013  . Hyperlipidemia   . Anxiety   . Depressive disorder, not elsewhere classified   . Rhinitis   . Elevated blood sugar   . Atrophic vaginitis   . Postmenopausal    Outpatient Encounter Prescriptions as of 07/03/2014  Medication Sig  . ALPRAZolam (XANAX) 0.25 MG tablet TAKE ONE TABLET BY MOUTH TWICE DAILY AS NEEDED FOR ANXIETY  . amLODipine (NORVASC) 2.5 MG tablet Take 1 tablet (2.5 mg total) by mouth daily.  Marland Kitchen aspirin 81 MG tablet Take 81 mg by mouth daily.  . calcium carbonate (OS-CAL) 600 MG TABS tablet Take 600 mg by mouth 2 (two) times daily with a meal.  . cholecalciferol (VITAMIN D) 1000 UNITS tablet Take 2,000 Units by mouth daily.  Marland Kitchen doxycycline (VIBRA-TABS) 100 MG tablet Take 1 tablet (100 mg total) by mouth 2 (two) times daily.  . fluticasone (FLONASE) 50 MCG/ACT nasal spray Place 1 spray into both  nostrils daily.  Marland Kitchen lovastatin (MEVACOR) 40 MG tablet Take 20 mg by mouth at bedtime.  . Omega-3 Fatty Acids (FISH OIL) 1000 MG CAPS Take 2 capsules by mouth daily.  Marland Kitchen HYDROcodone-homatropine (HYCODAN) 5-1.5 MG/5ML syrup Take 1 tsp every 4-6 hours for cough and congstion (Patient not taking: Reported on 07/03/2014)    Review of Systems  Constitutional: Negative.   HENT: Negative.   Eyes: Negative.   Respiratory: Negative.   Cardiovascular: Negative.   Gastrointestinal: Negative.   Endocrine: Negative.   Genitourinary: Negative.   Musculoskeletal: Negative.   Skin: Positive for rash (face).  Allergic/Immunologic: Negative.   Neurological: Negative.   Hematological: Negative.   Psychiatric/Behavioral: The patient is nervous/anxious.        Objective:   Physical Exam BP 113/64 mmHg  Pulse 73  Temp(Src) 96.8 F (36 C) (Oral)  Ht 5\' 2"  (1.575 m)  Wt 152 lb (68.947 kg)  BMI 27.79 kg/m2  The facial rash continues with isolated lesions at the left mandible right upper for head and right cheek. While was examining the patient and talking with her she had her hands constantly on her face rubbing her face and touching all these places on her face.      Assessment & Plan:  1. Facial dermatitis -Continue and finish the doxycycline -We will arrange for you to see the dermatologist for follow-up and more treatment options  for the face  2. Adjustment disorder with anxious mood -Increase the Xanax from 1 twice a day to one in the morning and one in the evening and a half a one during the middle of the day  Patient Instructions  We will arrange for you to have an appointment with Dr. Nevada Crane in reasonable to follow-up on the facial skin infection. Finish the current antibiotic, then start Keflex 500 mg 3 times a day. When you go to see Dr. Nevada Crane, please take the empty bottle of doxycycline with you to see him so that he will know what you have been taking. Also increase the Xanax to 1 twice  a day and one half in the middle of the day to calm you down    Arrie Senate MD

## 2014-08-16 ENCOUNTER — Other Ambulatory Visit (INDEPENDENT_AMBULATORY_CARE_PROVIDER_SITE_OTHER): Payer: Medicare Other

## 2014-08-16 DIAGNOSIS — F419 Anxiety disorder, unspecified: Secondary | ICD-10-CM | POA: Diagnosis not present

## 2014-08-16 DIAGNOSIS — J449 Chronic obstructive pulmonary disease, unspecified: Secondary | ICD-10-CM | POA: Diagnosis not present

## 2014-08-16 DIAGNOSIS — R531 Weakness: Secondary | ICD-10-CM | POA: Diagnosis not present

## 2014-08-16 DIAGNOSIS — R7309 Other abnormal glucose: Secondary | ICD-10-CM | POA: Diagnosis not present

## 2014-08-16 DIAGNOSIS — R5383 Other fatigue: Secondary | ICD-10-CM

## 2014-08-16 DIAGNOSIS — I10 Essential (primary) hypertension: Secondary | ICD-10-CM | POA: Diagnosis not present

## 2014-08-16 DIAGNOSIS — E785 Hyperlipidemia, unspecified: Secondary | ICD-10-CM

## 2014-08-16 DIAGNOSIS — R739 Hyperglycemia, unspecified: Secondary | ICD-10-CM

## 2014-08-16 LAB — POCT CBC
GRANULOCYTE PERCENT: 66 % (ref 37–80)
HEMATOCRIT: 43.7 % (ref 37.7–47.9)
HEMOGLOBIN: 13.7 g/dL (ref 12.2–16.2)
LYMPH, POC: 1.9 (ref 0.6–3.4)
MCH, POC: 28.4 pg (ref 27–31.2)
MCHC: 31.4 g/dL — AB (ref 31.8–35.4)
MCV: 90.5 fL (ref 80–97)
MPV: 9.9 fL (ref 0–99.8)
POC Granulocyte: 4.4 (ref 2–6.9)
POC LYMPH %: 28.4 % (ref 10–50)
Platelet Count, POC: 264 10*3/uL (ref 142–424)
RBC: 4.83 M/uL (ref 4.04–5.48)
RDW, POC: 13.8 %
WBC: 6.7 10*3/uL (ref 4.6–10.2)

## 2014-08-16 LAB — POCT GLYCOSYLATED HEMOGLOBIN (HGB A1C): HEMOGLOBIN A1C: 6.6

## 2014-08-16 NOTE — Progress Notes (Signed)
Lab only 

## 2014-08-17 LAB — NMR, LIPOPROFILE
Cholesterol: 128 mg/dL (ref 100–199)
HDL Cholesterol by NMR: 46 mg/dL
HDL Particle Number: 29.1 umol/L — ABNORMAL LOW
LDL Particle Number: 921 nmol/L
LDL Size: 20.6 nm
LDL-C: 63 mg/dL (ref 0–99)
LP-IR Score: 56 — ABNORMAL HIGH
Small LDL Particle Number: 403 nmol/L
Triglycerides by NMR: 93 mg/dL (ref 0–149)

## 2014-08-17 LAB — BMP8+EGFR
BUN / CREAT RATIO: 17 (ref 11–26)
BUN: 13 mg/dL (ref 8–27)
CO2: 25 mmol/L (ref 18–29)
Calcium: 9.5 mg/dL (ref 8.7–10.3)
Chloride: 102 mmol/L (ref 97–108)
Creatinine, Ser: 0.77 mg/dL (ref 0.57–1.00)
GFR, EST AFRICAN AMERICAN: 84 mL/min/{1.73_m2} (ref >59)
GFR, EST NON AFRICAN AMERICAN: 73 mL/min/{1.73_m2} (ref >59)
GLUCOSE: 126 mg/dL — AB (ref 65–99)
POTASSIUM: 4.3 mmol/L (ref 3.5–5.2)
Sodium: 141 mmol/L (ref 134–144)

## 2014-08-17 LAB — VITAMIN D 25 HYDROXY (VIT D DEFICIENCY, FRACTURES): Vit D, 25-Hydroxy: 38.9 ng/mL (ref 30.0–100.0)

## 2014-08-17 LAB — THYROID PANEL WITH TSH
Free Thyroxine Index: 2.4 (ref 1.2–4.9)
T3 Uptake Ratio: 30 % (ref 24–39)
T4, Total: 7.9 ug/dL (ref 4.5–12.0)
TSH: 1.87 u[IU]/mL (ref 0.450–4.500)

## 2014-08-17 LAB — HEPATIC FUNCTION PANEL
ALT: 17 IU/L (ref 0–32)
AST: 21 IU/L (ref 0–40)
Albumin: 4 g/dL (ref 3.5–4.7)
Alkaline Phosphatase: 78 IU/L (ref 39–117)
Bilirubin Total: 0.6 mg/dL (ref 0.0–1.2)
Bilirubin, Direct: 0.17 mg/dL (ref 0.00–0.40)
Total Protein: 6.4 g/dL (ref 6.0–8.5)

## 2014-08-23 ENCOUNTER — Ambulatory Visit: Payer: Medicare Other | Admitting: Family Medicine

## 2014-08-24 ENCOUNTER — Ambulatory Visit (INDEPENDENT_AMBULATORY_CARE_PROVIDER_SITE_OTHER): Payer: Medicare Other | Admitting: Family Medicine

## 2014-08-24 ENCOUNTER — Encounter: Payer: Self-pay | Admitting: Family Medicine

## 2014-08-24 VITALS — BP 135/70 | HR 76 | Temp 97.1°F | Ht 62.0 in | Wt 154.0 lb

## 2014-08-24 DIAGNOSIS — L739 Follicular disorder, unspecified: Secondary | ICD-10-CM

## 2014-08-24 DIAGNOSIS — I10 Essential (primary) hypertension: Secondary | ICD-10-CM

## 2014-08-24 DIAGNOSIS — Z780 Asymptomatic menopausal state: Secondary | ICD-10-CM

## 2014-08-24 DIAGNOSIS — L309 Dermatitis, unspecified: Secondary | ICD-10-CM

## 2014-08-24 MED ORDER — CEPHALEXIN 500 MG PO CAPS: 500.0000 mg | ORAL_CAPSULE | Freq: Two times a day (BID) | ORAL | Status: DC

## 2014-08-24 MED ORDER — HYDROXYZINE HCL 10 MG PO TABS: 10.0000 mg | ORAL_TABLET | Freq: Three times a day (TID) | ORAL | Status: DC | PRN

## 2014-08-24 MED ORDER — ESCITALOPRAM OXALATE 10 MG PO TABS: 10.0000 mg | ORAL_TABLET | Freq: Every day | ORAL | Status: DC

## 2014-08-24 NOTE — Patient Instructions (Addendum)
Medicare Annual Wellness Visit  [facility] and the medical providers at [facility] strive to bring you the best medical care.  In doing so we not only want to address your current medical conditions and concerns but also to detect new conditions early and prevent illness, disease and health-related problems.    Medicare offers a yearly Wellness Visit which allows our clinical staff to assess your need for preventative services including immunizations, lifestyle education, counseling to decrease risk of preventable diseases and screening for fall risk and other medical concerns.    This visit is provided free of charge (no copay) for all Medicare recipients. The clinical pharmacists at [facility] have begun to conduct these Wellness Visits which will also include a thorough review of all your medications.    As you primary medical provider recommend that you make an appointment for your Annual Wellness Visit if you have not done so already this year.  You may set up this appointment before you leave today or you may call back ([phone]) and schedule an appointment.  Please make sure when you call that you mention that you are scheduling your Annual Wellness Visit with the clinical pharmacist so that the appointment may be made for the proper length of time.     Continue current medications. Continue good therapeutic lifestyle changes which include good diet and exercise. Fall precautions discussed with patient. If an FOBT was given today- please return it to our front desk. If you are over 54 years old - you may need Prevnar 73 or the adult Pneumonia vaccine.  Flu Shots are still available at our office. If you still haven't had one please call to set up a nurse visit to get one.   After your visit with [place] today you will receive a survey in the mail or online from [employer] regarding your care with [place]. Please take a moment to  fill this out. Your feedback is very important to [place] as you can help [place] better understand your patient needs as well as improve your experience and satisfaction. WE CARE ABOUT YOU!!!   Continue to use dove soap Rest this off well Continue to stay away from any soaps that are scented or fabric softeners that are scented and and use a mild detergent Take antibiotic as directed

## 2014-08-24 NOTE — Progress Notes (Signed)
Subjective:    Patient ID: [name] [name], female    DOB: 02-Apr-1932, 81 y.o.   MRN: [id number]  HPI Pt here for follow up and management of chronic medical problems which includes hypertension and folliculitis. She is taking medications regularly. All lab work that was recently done was reviewed with patient and was within normal limits. This includes her kidney function test and electrolytes. All of her liver function tests cholesterol numbers vitamin D thyroid function were all good. The blood sugar was however slightly elevated at 126. She will be given a copy of this lab work and will be reviewed with her during the visit. The patient continues to have a pruritic rash over her body and it appears to be getting worse instead of better. She unfortunately did not get to see the dermatologist as planned. She has taken 2 rounds of antibiotics including Keflex and doxycycline with out improvement. She was allergic to the cephalexin.       Patient Active Problem List   Diagnosis Date Noted  . Rectal bleeding 03/07/2014  . BRBPR (bright red blood per rectum) 03/07/2014  . History of total left hip arthroplasty 01/25/2014  . Elevated liver enzymes 01/13/2014  . Edema 01/02/2014  . Hypoxia 01/02/2014  . S/P total hip arthroplasty 12/27/2013  . Chronic cough 10/26/2013  . Hyperlipidemia   . Anxiety   . Depressive disorder, not elsewhere classified   . Rhinitis   . Elevated blood sugar   . Atrophic vaginitis   . Postmenopausal    Outpatient Encounter Prescriptions as of 08/24/2014  Medication Sig  . ALPRAZolam (XANAX) 0.25 MG tablet TAKE ONE TABLET BY MOUTH TWICE DAILY AS NEEDED FOR ANXIETY  . amLODipine (NORVASC) 2.5 MG tablet Take 1 tablet (2.5 mg total) by mouth daily.  Marland Kitchen aspirin 81 MG tablet Take 81 mg by mouth daily.  . calcium carbonate (OS-CAL) 600 MG TABS tablet Take 600 mg by mouth 2 (two) times daily with a meal.  . cholecalciferol (VITAMIN D) 1000 UNITS tablet Take 2,000  Units by mouth daily.  Marland Kitchen doxycycline (VIBRA-TABS) 100 MG tablet Take 1 tablet (100 mg total) by mouth 2 (two) times daily.  . fluticasone (FLONASE) 50 MCG/ACT nasal spray Place 1 spray into both nostrils daily.  Marland Kitchen HYDROcodone-homatropine (HYCODAN) 5-1.5 MG/5ML syrup Take 1 tsp every 4-6 hours for cough and congstion  . lovastatin (MEVACOR) 40 MG tablet Take 20 mg by mouth at bedtime.  . Omega-3 Fatty Acids (FISH OIL) 1000 MG CAPS Take 2 capsules by mouth daily.   No facility-administered encounter medications on file as of 08/24/2014.      Review of Systems  Constitutional: Negative.   HENT: Negative.   Eyes: Negative.   Respiratory: Negative.   Cardiovascular: Negative.   Gastrointestinal: Negative.   Endocrine: Negative.   Genitourinary: Negative.   Musculoskeletal: Negative.   Skin: Positive for rash (folliculitis).  Allergic/Immunologic: Negative.   Neurological: Negative.   Hematological: Negative.   Psychiatric/Behavioral: Negative.        Objective:   Physical Exam  Constitutional: She is oriented to person, place, and time. She appears well-developed and well-nourished.  Alert and pleasant and just retired--- still having concerns about this persistent dermatitis on her face and arms that is pruritic and not any better  HENT:  Head: Normocephalic and atraumatic.  Right Ear: External ear normal.  Left Ear: External ear normal.  Nose: Nose normal.  Mouth/Throat: Oropharynx is clear and moist. No oropharyngeal exudate.  Eyes: Conjunctivae and EOM are normal. Pupils are equal, round, and reactive to light. Right eye exhibits no discharge. Left eye exhibits no discharge. No scleral icterus.  Neck: Normal range of motion. Neck supple. No thyromegaly present.  Cardiovascular: Normal rate, regular rhythm, normal heart sounds and intact distal pulses.  Exam reveals no gallop and no friction rub.   No murmur heard. At 72/m  Pulmonary/Chest: Effort normal and breath sounds  normal. No respiratory distress. She has no wheezes. She has no rales. She exhibits no tenderness.  Clear anteriorly and posteriorly  Abdominal: Soft. Bowel sounds are normal. She exhibits no mass. There is no tenderness. There is no rebound and no guarding.  No abdominal tenderness or organ enlargement  Musculoskeletal: Normal range of motion. She exhibits no edema or tenderness.  Lymphadenopathy:    She has no cervical adenopathy.  Neurological: She is alert and oriented to person, place, and time. She has normal reflexes. No cranial nerve deficit.  Skin: Skin is warm and dry. Rash noted. There is erythema. No pallor.  Excoriated and erythematous patches of rash on sun exposed areas of face and arms  Psychiatric: She has a normal mood and affect. Her behavior is normal. Judgment and thought content normal.  Nursing note and vitals reviewed.   BP 135/70 mmHg  Pulse 76  Temp(Src) 97.1 F (36.2 C) (Oral)  Ht 5\' 2"  (1.575 m)  Wt 154 lb (69.854 kg)  BMI 28.16 kg/m2       Assessment & Plan:  1. Postmenopausal -The patient is not having any hot flashes or any problems with this.  2. Essential hypertension -The blood pressure is under good control today and she will continue with current treatment  3. Folliculitis -This is an ongoing rash that is not responsive to any treatment that has been rendered so far.  4. Dermatitis -She will use a good antibacterial soap and take prescribed antibiotic as directed -If she is not improving by mid next week we will do another referral to a different dermatologist -Also take the hydroxyzine for itching 10 mg every 4-6 hours  Patient Instructions                       Medicare Annual Wellness Visit  [facility] and the medical providers at [facility] strive to bring you the best medical care.  In doing so we not only want to address your current medical conditions and concerns but also to detect new conditions early and  prevent illness, disease and health-related problems.    Medicare offers a yearly Wellness Visit which allows our clinical staff to assess your need for preventative services including immunizations, lifestyle education, counseling to decrease risk of preventable diseases and screening for fall risk and other medical concerns.    This visit is provided free of charge (no copay) for all Medicare recipients. The clinical pharmacists at [facility] have begun to conduct these Wellness Visits which will also include a thorough review of all your medications.    As you primary medical provider recommend that you make an appointment for your Annual Wellness Visit if you have not done so already this year.  You may set up this appointment before you leave today or you may call back ([phone]) and schedule an appointment.  Please make sure when you call that you mention that you are scheduling your Annual Wellness Visit with the clinical pharmacist so that the appointment may  be made for the proper length of time.     Continue current medications. Continue good therapeutic lifestyle changes which include good diet and exercise. Fall precautions discussed with patient. If an FOBT was given today- please return it to our front desk. If you are over 32 years old - you may need Prevnar 79 or the adult Pneumonia vaccine.  Flu Shots are still available at our office. If you still haven't had one please call to set up a nurse visit to get one.   After your visit with [place] today you will receive a survey in the mail or online from [employer] regarding your care with [place]. Please take a moment to fill this out. Your feedback is very important to [place] as you can help [place] better understand your patient needs as well as improve your experience and satisfaction. WE CARE ABOUT YOU!!!   Continue to use dove soap Rest this off well Continue to stay away from any soaps that are scented or fabric  softeners that are scented and and use a mild detergent Take antibiotic as directed   Arrie Senate MD

## 2014-08-30 ENCOUNTER — Ambulatory Visit (INDEPENDENT_AMBULATORY_CARE_PROVIDER_SITE_OTHER): Payer: Medicare Other | Admitting: Pharmacist

## 2014-08-30 VITALS — BP 138/72 | HR 77 | Ht 62.0 in | Wt 154.0 lb

## 2014-08-30 DIAGNOSIS — E119 Type 2 diabetes mellitus without complications: Secondary | ICD-10-CM | POA: Insufficient documentation

## 2014-08-30 DIAGNOSIS — L309 Dermatitis, unspecified: Secondary | ICD-10-CM | POA: Diagnosis not present

## 2014-08-30 MED ORDER — METFORMIN HCL ER 500 MG PO TB24: 500.0000 mg | ORAL_TABLET | Freq: Every day | ORAL | Status: DC

## 2014-08-30 MED ORDER — GLUCOSE BLOOD VI STRP: ORAL_STRIP | Status: DC

## 2014-08-30 MED ORDER — LISINOPRIL 10 MG PO TABS: 10.0000 mg | ORAL_TABLET | Freq: Every day | ORAL | Status: DC

## 2014-08-30 MED ORDER — ONETOUCH DELICA LANCETS 33G MISC: Status: DC

## 2014-08-30 NOTE — Progress Notes (Signed)
Patient ID: [name] [name], female   DOB: 1931/11/09, 81 y.o.   MRN: [id number]  Subjective:    [name] [name] is a 81 y.o. female who presents for an initial evaluation of Type 2 diabetes mellitus.  Current symptoms/problems include none  Newly diagnosed type 2 DM - A1c was 6.6% (08/24/2014)   Known diabetic complications: none Cardiovascular risk factors: advanced age (older than 71 for men, 38 for women), diabetes mellitus, dyslipidemia and hypertension Current diabetic medications include none.   Eye exam current (within one year): yes Weight trend: stable Prior visit with dietician: no Current diet: in general, a "healthy" diet   but is drinking a lot of sweet tea.  She eats lots of salads.  Red meat only once a week or less. Current exercise: walking - 2 or 3 days per week.  Walks for 30 minutes.  Current monitoring regimen: none Home blood sugar records: none Any episodes of hypoglycemia? no  Is She on ACE inhibitor or angiotensin II receptor blocker?  No   The following portions of the patient's history were reviewed and updated as appropriate: allergies, current medications, past family history, past medical history, past social history, past surgical history and problem list.   Objective:    BP 138/72 mmHg  Pulse 77  Ht 5\' 2"  (1.575 m)  Wt 154 lb (69.854 kg)  BMI 28.16 kg/m2  A1c = 6.6% (08/24/2014)  Lab Review GLUCOSE (mg/dL)  Date Value  08/16/2014 126*  04/18/2014 127*  12/04/2013 133*   GLUCOSE, BLD (mg/dL)  Date Value  03/07/2014 180*  12/29/2013 133*  12/28/2013 154*   CO2  Date Value  08/16/2014 25 mmol/L  04/18/2014 26 mmol/L  03/07/2014 26 mEq/L   BUN (mg/dL)  Date Value  08/16/2014 13  04/18/2014 13  03/07/2014 11  12/29/2013 20  12/28/2013 20  12/04/2013 12   CREAT (mg/dL)  Date Value  12/26/2012 0.88  08/09/2012 0.77   CREATININE, SER (mg/dL)  Date Value  08/16/2014 0.77  04/18/2014 0.78  03/07/2014 0.66       Assessment:    Newly Diagnosed Diabetes Mellitus type II, under good control.    Plan:    1.  Rx changes:  Start metformin XR 500mg  take 1 tablet with breakfast  Finish current rx for amlodipine then switch to lisnipril 10mg  take 1 tablet daily 2.  Education: Reviewed 'ABCs' of diabetes management (respective goals in parentheses):  A1C (<7), blood pressure (<130/80), and cholesterol (LDL <100) 3.  Discussed CHO counting diet.  Patient to limit meals to 45 grams of CHO or less and snack to 15 grams or less.  Patient to stop drinking sweet tea and switch to water.  4.  Increase walking to 5 days weekly 5.  Referral to dermatology 6.  Glucometer given and taught to use.  Rx sent for test strip and lancet - check BG QOD to QD 7. Follow up: 1 month    [name] [name], PharmD, CPP, CDE

## 2014-08-30 NOTE — Patient Instructions (Signed)
Start Metformin XR $RemoveBefo'500mg'IRoBKYEbfMr$  - take 1 tablet daily with a meal.  Continue your current prescription of amlodipine then stop Start lisinopril $RemoveBeforeDE'10mg'NPRJvyXIJxlwcIX$  - take 1 tablet daily in place of amlodipine because will help with blood pressure and also protect kidneys from diabetes.   Diabetes and Standards of Medical Care   Diabetes is complicated. You may find that your diabetes team includes a dietitian, nurse, diabetes educator, eye doctor, and more. To help everyone know what is going on and to help you get the care you deserve, the following schedule of care was developed to help keep you on track. Below are the tests, exams, vaccines, medicines, education, and plans you will need.  Blood Glucose Goals Prior to meals = 80 - 130 Within 2 hours of the start of a meal = less than 180  HbA1c test (goal is less than 7.0% - your last value was 6.6%) This test shows how well you have controlled your glucose over the past 2 to 3 months. It is used to see if your diabetes management plan needs to be adjusted.   It is performed at least 2 times a year if you are meeting treatment goals.  It is performed 4 times a year if therapy has changed or if you are not meeting treatment goals.  Blood pressure test  This test is performed at every routine medical visit. The goal is less than 140/90 mmHg for most people, but 130/80 mmHg in some cases. Ask your health care provider about your goal.  Dental exam  Follow up with the dentist regularly.  Eye exam  If you are diagnosed with type 1 diabetes as a child, get an exam upon reaching the age of 37 years or older and have had diabetes for 3 to 5 years. Yearly eye exams are recommended after that initial eye exam.  If you are diagnosed with type 1 diabetes as an adult, get an exam within 5 years of diagnosis and then yearly.  If you are diagnosed with type 2 diabetes, get an exam as soon as possible after the diagnosis and then yearly.  Foot care exam  Visual  foot exams are performed at every routine medical visit. The exams check for cuts, injuries, or other problems with the feet.  A comprehensive foot exam should be done yearly. This includes visual inspection as well as assessing foot pulses and testing for loss of sensation.  Check your feet nightly for cuts, injuries, or other problems with your feet. Tell your health care provider if anything is not healing.  Kidney function test (urine microalbumin)  This test is performed once a year.  Type 1 diabetes: The first test is performed 5 years after diagnosis.  Type 2 diabetes: The first test is performed at the time of diagnosis.  A serum creatinine and estimated glomerular filtration rate (eGFR) test is done once a year to assess the level of chronic kidney disease (CKD), if present.  Lipid profile (cholesterol, HDL, LDL, triglycerides)  Performed every 5 years for most people.  The goal for LDL is less than 100 mg/dL. If you are at high risk, the goal is less than 70 mg/dL.  The goal for HDL is 40 mg/dL to 50 mg/dL for men and 50 mg/dL to 60 mg/dL for women. An HDL cholesterol of 60 mg/dL or higher gives some protection against heart disease.  The goal for triglycerides is less than 150 mg/dL.  Influenza vaccine, pneumococcal vaccine, and hepatitis B vaccine  The influenza vaccine is recommended yearly.  The pneumococcal vaccine is generally given once in a lifetime. However, there are some instances when another vaccination is recommended. Check with your health care provider.  The hepatitis B vaccine is also recommended for adults with diabetes.  Diabetes self-management education  Education is recommended at diagnosis and ongoing as needed.  Treatment plan  Your treatment plan is reviewed at every medical visit.  Document Released: 01/25/2009 Document Revised: 11/30/2012 Document Reviewed: 08/30/2012 [facility] Patient Information 2014 [place].

## 2014-08-31 ENCOUNTER — Other Ambulatory Visit: Payer: Self-pay | Admitting: Family Medicine

## 2014-08-31 NOTE — Telephone Encounter (Signed)
Last filled 07/31/14, last seen 08/24/14. Dr Georgia Duff pt, Call into [facility]

## 2014-09-17 ENCOUNTER — Ambulatory Visit: Payer: Medicare Other

## 2014-09-17 ENCOUNTER — Telehealth: Payer: Self-pay | Admitting: Pharmacist

## 2014-09-17 ENCOUNTER — Other Ambulatory Visit (INDEPENDENT_AMBULATORY_CARE_PROVIDER_SITE_OTHER): Payer: Medicare Other

## 2014-09-17 ENCOUNTER — Other Ambulatory Visit: Payer: Self-pay | Admitting: Pharmacist

## 2014-09-17 DIAGNOSIS — T50905A Adverse effect of unspecified drugs, medicaments and biological substances, initial encounter: Secondary | ICD-10-CM

## 2014-09-17 DIAGNOSIS — R300 Dysuria: Secondary | ICD-10-CM

## 2014-09-17 DIAGNOSIS — T887XXA Unspecified adverse effect of drug or medicament, initial encounter: Secondary | ICD-10-CM | POA: Diagnosis not present

## 2014-09-17 LAB — POCT URINALYSIS DIPSTICK
BILIRUBIN UA: NEGATIVE
Glucose, UA: NEGATIVE
Ketones, UA: NEGATIVE
Nitrite, UA: NEGATIVE
PH UA: 7.5
Protein, UA: NEGATIVE
RBC UA: NEGATIVE
SPEC GRAV UA: 1.01
Urobilinogen, UA: NEGATIVE

## 2014-09-17 LAB — POCT UA - MICROSCOPIC ONLY
BACTERIA, U MICROSCOPIC: NEGATIVE
Casts, Ur, LPF, POC: NEGATIVE
Crystals, Ur, HPF, POC: NEGATIVE
Mucus, UA: NEGATIVE
RBC, urine, microscopic: NEGATIVE
YEAST UA: NEGATIVE

## 2014-09-17 NOTE — Progress Notes (Signed)
Lab only 

## 2014-09-17 NOTE — Telephone Encounter (Signed)
Patient stated metformin and lisinopril about 2 weeks ago.  She has noticed an unusual smell to her urine and is concerned that one of the new medications is "affecting her kidneys in a bad way".  She denies any dysuria.   I recommended to ease patient's mind that she come to lab to have urinalysis and BMET.   Patient agrees and will come in next 1-2 days. She is to continue both metformin and lisinopril until lab results return.

## 2014-09-18 LAB — BMP8+EGFR
BUN/Creatinine Ratio: 15 (ref 11–26)
BUN: 13 mg/dL (ref 8–27)
CHLORIDE: 101 mmol/L (ref 97–108)
CO2: 23 mmol/L (ref 18–29)
Calcium: 9.8 mg/dL (ref 8.7–10.3)
Creatinine, Ser: 0.85 mg/dL (ref 0.57–1.00)
GFR calc Af Amer: 75 mL/min/{1.73_m2} (ref >59)
GFR calc non Af Amer: 65 mL/min/{1.73_m2} (ref >59)
Glucose: 93 mg/dL (ref 65–99)
POTASSIUM: 4.8 mmol/L (ref 3.5–5.2)
Sodium: 138 mmol/L (ref 134–144)

## 2014-09-20 ENCOUNTER — Telehealth: Payer: Self-pay | Admitting: Pharmacist

## 2014-09-20 LAB — URINE CULTURE

## 2014-09-20 NOTE — Telephone Encounter (Signed)
Labs were WNL - left message for patient.

## 2014-09-21 ENCOUNTER — Other Ambulatory Visit: Payer: Self-pay | Admitting: Pharmacist

## 2014-09-21 ENCOUNTER — Telehealth: Payer: Self-pay | Admitting: Pharmacist

## 2014-09-21 MED ORDER — CEPHALEXIN 500 MG PO CAPS: 500.0000 mg | ORAL_CAPSULE | Freq: Two times a day (BID) | ORAL | Status: DC

## 2014-09-21 NOTE — Telephone Encounter (Signed)
Patient's chart notes that she is allergic to cephalexin but she has recently taken without any problems.

## 2014-09-21 NOTE — Telephone Encounter (Signed)
Confirmed with patient that she is able to tolerate cephalexin.  Rx sent to pharmacy.

## 2014-09-21 NOTE — Telephone Encounter (Signed)
Patient notified about urine culture results and Rx for cephalexin sen to pharmacy

## 2014-10-01 ENCOUNTER — Other Ambulatory Visit: Payer: Self-pay | Admitting: Family Medicine

## 2014-10-08 ENCOUNTER — Other Ambulatory Visit: Payer: Self-pay

## 2014-10-11 ENCOUNTER — Encounter: Payer: Self-pay | Admitting: Family Medicine

## 2014-10-11 ENCOUNTER — Telehealth: Payer: Self-pay | Admitting: Family Medicine

## 2014-10-12 MED ORDER — AMLODIPINE BESYLATE 2.5 MG PO TABS: 2.5000 mg | ORAL_TABLET | Freq: Every day | ORAL | Status: DC

## 2014-10-12 NOTE — Telephone Encounter (Signed)
FYI     Per home nurse the lisinopril was causing her to cough.  She has stopped it and went back to taking the amlodipine.

## 2014-10-26 ENCOUNTER — Telehealth: Payer: Self-pay | Admitting: Pharmacist

## 2014-10-26 ENCOUNTER — Encounter: Payer: Self-pay | Admitting: Pharmacist

## 2014-10-26 ENCOUNTER — Ambulatory Visit (INDEPENDENT_AMBULATORY_CARE_PROVIDER_SITE_OTHER): Payer: Medicare Other | Admitting: Pharmacist

## 2014-10-26 VITALS — BP 142/70 | HR 74 | Ht 62.0 in | Wt 149.0 lb

## 2014-10-26 DIAGNOSIS — R350 Frequency of micturition: Secondary | ICD-10-CM | POA: Diagnosis not present

## 2014-10-26 DIAGNOSIS — E119 Type 2 diabetes mellitus without complications: Secondary | ICD-10-CM

## 2014-10-26 DIAGNOSIS — I10 Essential (primary) hypertension: Secondary | ICD-10-CM

## 2014-10-26 DIAGNOSIS — Z0000 Encounter for general adult medical examination without abnormal findings: Secondary | ICD-10-CM | POA: Diagnosis not present

## 2014-10-26 DIAGNOSIS — F424 Excoriation (skin-picking) disorder: Secondary | ICD-10-CM

## 2014-10-26 LAB — POCT UA - MICROSCOPIC ONLY
Casts, Ur, LPF, POC: NEGATIVE
Crystals, Ur, HPF, POC: NEGATIVE
MUCUS UA: NEGATIVE
YEAST UA: NEGATIVE

## 2014-10-26 LAB — POCT URINALYSIS DIPSTICK
Bilirubin, UA: NEGATIVE
Glucose, UA: NEGATIVE
KETONES UA: NEGATIVE
Nitrite, UA: NEGATIVE
PH UA: 6.5
Spec Grav, UA: 1.01
UROBILINOGEN UA: NEGATIVE

## 2014-10-26 MED ORDER — MUPIROCIN 2 % EX OINT: 1.0000 "application " | TOPICAL_OINTMENT | Freq: Three times a day (TID) | CUTANEOUS | Status: DC

## 2014-10-26 MED ORDER — TRIAMCINOLONE ACETONIDE 0.1 % EX CREA: 1.0000 "application " | TOPICAL_CREAM | Freq: Two times a day (BID) | CUTANEOUS | Status: DC

## 2014-10-26 NOTE — Patient Instructions (Addendum)
  [name] [name] , Thank you for taking time to come for your Medicare Wellness Visit. I appreciate your ongoing commitment to your health goals. Please review the following plan we discussed and let me know if I can assist you in the future.    This is a list of the screening recommended for you and due dates:  Health Maintenance  Topic Date Due  .    Marland Kitchen Flu Shot  11/12/2014  . Hemoglobin A1C  02/16/2015  . Tetanus Vaccine  04/13/2016  . Colon Cancer Screening  03/08/2024  . DEXA scan (bone density measurement)  Completed  . Shingles Vaccine  Completed  . Pneumonia vaccines  Completed   I recommend that you start Lexapro / escitalopram 10mg  1 tablet daily - this medication is for anxiety and to help with skin picking.   Also stop the two cream / ointments that the dermatologist gave you. We have sent presciptions for an antibiotic ointment - mupirocin and corticosteroid cream for triamcinolone.  If you are not seeing any improvement you should contact our office to have evaluated by Marline Backbone.

## 2014-10-26 NOTE — Progress Notes (Signed)
Patient ID: [name] [name], female   DOB: [birth year], 81 y.o.   MRN: [id number]    Subjective:   [name] [name] is a 81 y.o. female who presents for a subsequent Medicare Annual Wellness Visit.  She report that nurse from [facility] visited her home last week.  Patient reported to nurse that she was having a cough and this cough was resulting in bladder leakage.  Nurse instructed patient to stop lisinopril.  Noted in chart that patient had intolerance / ACE cough.    Patient is also very anxious because the sores on her skin have not improved with the clindamycin and fluticasone treatment over the last month.  She has not notified dermatologist that prescribed and is not sure she wants to go back to that particular dermatologist as she was not satisfied the returning of call when she has concerns about the cost of medications.  [name] did not continue escitalopram because it was prescribed at the same time as hydroxyzine which made her very sleepy.  Patient is very nervous and anxious today and continues to worsen throughout our visit.  She voices frustration about diet recommended for diabetes and feels that there is nothing that she can eat.   HBG readings - 144, 135, 103, 176, 133, 104, 138, 120, 100, 136, 122, 109, 148, 129, 89  Current Medications (verified) Outpatient Encounter Prescriptions as of 10/26/2014  Medication Sig  . ALPRAZolam (XANAX) 0.25 MG tablet TAKE ONE TABLET BY MOUTH TWICE DAILY AS NEEDED FOR ANXIETY  . amLODipine (NORVASC) 2.5 MG tablet Take 1 tablet (2.5 mg total) by mouth daily.  Marland Kitchen aspirin 81 MG tablet Take 81 mg by mouth daily.  . calcium carbonate (OS-CAL) 600 MG TABS tablet Take 600 mg by mouth 2 (two) times daily with a meal.  . cephALEXin (KEFLEX) 500 MG capsule Take 1 capsule (500 mg total) by mouth 2 (two) times daily.  . cholecalciferol (VITAMIN D) 1000 UNITS tablet Take 2,000 Units by mouth daily.  Marland Kitchen escitalopram (LEXAPRO) 10 MG tablet Take 1 tablet (10 mg  total) by mouth daily. AS DIRECTED  . fluticasone (FLONASE) 50 MCG/ACT nasal spray Place 1 spray into both nostrils daily.  Marland Kitchen glucose blood test strip Use to check BG once a day to every other day.  Dx:  E11.9  . hydrOXYzine (ATARAX/VISTARIL) 10 MG tablet Take 1 tablet (10 mg total) by mouth 3 (three) times daily as needed. (Patient not taking: Reported on 08/30/2014)  . lovastatin (MEVACOR) 40 MG tablet TAKE ONE TABLET BY MOUTH ONE  TIME DAILY  . metFORMIN (GLUCOPHAGE XR) 500 MG 24 hr tablet Take 1 tablet (500 mg total) by mouth daily with breakfast.  . Omega-3 Fatty Acids (FISH OIL) 1000 MG CAPS Take 2 capsules by mouth daily.  Glory Rosebush DELICA LANCETS 78L MISC Use to check BG once a day to every other day Dx: E 11.9   No facility-administered encounter medications on file as of 10/26/2014.    Allergies (verified) Chlordiazepoxide-clidinium; Clarithromycin; Erymax; Evista; Penicillins; Sertraline hcl; Sulfa antibiotics; Betadine; and Hydrogen peroxide   History: Past Medical History  Diagnosis Date  . Other and unspecified hyperlipidemia   . Anxiety   . Disorder of bone and cartilage, unspecified   . Depressive disorder, not elsewhere classified   . Rhinitis   . Elevated blood sugar   . Atrophic vaginitis   . Postmenopausal   . Cataract   . Arthritis   . Hypertension   . COPD (chronic  obstructive pulmonary disease)   . Emphysema lung   . Diabetes mellitus without complication    Past Surgical History  Procedure Laterality Date  . Cataract extraction w/ intraocular lens implant Bilateral 2005    [place]  . Appendectomy  1957  . Total hip arthroplasty Left 12/27/2013    Procedure: LEFT TOTAL HIP ARTHROPLASTY;  Surgeon: Newt Minion, MD;  Location: [facility];  Service: Orthopedics;  Laterality: Left;  . Colonoscopy N/A 03/08/2014    Procedure: COLONOSCOPY;  Surgeon: Rogene Houston, MD;  [facility];  Service: Endoscopy;  Laterality: N/A;   Family History  Problem  Relation Age of Onset  . Arthritis Mother   . COPD Father   . Diabetes Father   . Aneurysm Sister   . Diabetes Sister    Social History   Occupational History  . Not on file.   Social History Main Topics  . Smoking status: Never Smoker   . Smokeless tobacco: Never Used  . Alcohol Use: No  . Drug Use: No  . Sexual Activity: No    Do you feel safe at home?  Yes  Dietary issues and exercise activities:   Current Dietary habits:  Patient is following low carbohydrate.    Objective:    There were no vitals filed for this visit. There is no weight on file to calculate BMI.  Activities of Daily Living In your present state of health, do you have any difficulty performing the following activities: 03/07/2014 12/27/2013  Hearing? N N  Vision? N N  Difficulty concentrating or making decisions? N N  Walking or climbing stairs? N Y  Dressing or bathing? N N  Doing errands, shopping? Y N    Are there smokers in your home (other than you)? No    Depression Screen PHQ 2/9 Scores 08/24/2014 07/03/2014 06/25/2014 04/24/2014  PHQ - 2 Score 0 0 0 0    Fall Risk Fall Risk  08/24/2014 07/03/2014 06/25/2014 04/24/2014 03/16/2014  Falls in the past year? No Yes Yes Yes Yes  Number falls in past yr: - 1 1 1 1   Injury with Fall? - Yes Yes No No    Cognitive Function: started exam but after 2 questions patient became upset so did not finish MMSE No flowsheet data found.  Immunizations and Health Maintenance Immunization History  Administered Date(s) Administered  . Influenza Whole 12/12/2009  . Influenza,inj,Quad PF,36+ Mos 02/01/2013, 12/29/2013  . Pneumococcal Conjugate-13 04/24/2013  . Pneumococcal Polysaccharide-23 01/12/2003  . Td 04/13/2006  . Zoster 03/13/2006   Health Maintenance Due  Topic Date Due  . FOOT EXAM  03/16/1944  . URINE MICROALBUMIN  03/16/1944  . OPHTHALMOLOGY EXAM  04/13/2010    Patient Care Team: Chipper Herb, MD as PCP - General (Family  Medicine) Newt Minion, MD (Orthopedic Surgery) Clent Jacks, MD (Ophthalmology) Kathee Delton, MD as Consulting Physician (Pulmonary Disease)  Indicate any recent Medical Services you may have received from other than [facility] providers in the past year (date may be approximate).    Assessment:    Annual Wellness Visit  Type 2 DM  HTN - SBP elevated today  Cough - likely cause of urinary problems;  Should improved with discontinuation of ACE Anxiety / skin picking disorder   Screening Tests Health Maintenance  Topic Date Due  . FOOT EXAM  03/16/1944  . URINE MICROALBUMIN  03/16/1944  . OPHTHALMOLOGY EXAM  04/13/2010  . INFLUENZA VACCINE  11/12/2014  . HEMOGLOBIN A1C  02/16/2015  . TETANUS/TDAP  04/13/2016  . COLONOSCOPY  03/08/2024  . DEXA SCAN  Completed  . ZOSTAVAX  Completed  . PNA vac Low Risk Adult  Completed        Plan:   During the course of the visit Camry was educated and counseled about the following appropriate screening and preventive services:   Vaccines to include Pneumoccal, Influenza, Hepatitis B, Td, Zostavax - UTD on recommended vaccines except Hep B  Colorectal cancer screening - colonoscopy and FOBT UTD  Cardiovascular disease screening - ECG is UTD  Continue to hold lisinopril.  If BP elevated at next visit and cough has resolved then recommend consider ARB such as valsartan 80mg    Rx for mupiricin and triamcinolone - should be cheaper than current regimen.  If no improvement in 10 days then RTC to see Marline Backbone or referral to dermatologist at [facility] / Osteoporosis Screening = UTD  Mammogram - patient refuses  Glaucoma screening / Diabetic Eye Exam - UTD, will request records from Dr Katy Fitch  Nutrition counseling - Reviewed serving sizes with patient.  Reiterated to patient that there are not foods that she can never have, just some foods that she needs to keep to smaller serving sizes. ie 1 sandwich instead of 2; 2 cookies  instead of 4; 1/2 cup of potatoes instead of 1 cup.  Advanced Directives - patient declines  Reviewed handout with patient about chair exercise however she declined to take handout.  She states that if she wants to do chair aerobics she will go to [facility].  Encouraged patient to do that.  Restart escitalopram - explained to patient that this mediation is to help with skin picking and anxiety.  Orders Placed This Encounter  Procedures  . Microalbumin / creatinine urine ratio  . POCT UA - Microscopic Only  . POCT urinalysis dipstick      Patient Instructions (the written plan) were given to the patient.   Cherre Robins, [facility]   10/26/2014

## 2014-10-26 NOTE — Telephone Encounter (Signed)
Patient's husband called to let me know that she has triamcinolone 0.1% at home.  It OK to use what she has.

## 2014-10-28 LAB — MICROALBUMIN / CREATININE URINE RATIO
Creatinine, Urine: 80.1 mg/dL
MICROALB/CREAT RATIO: 5.2 mg/g creat (ref 0.0–30.0)
Microalbumin, Urine: 4.2 ug/mL

## 2014-10-30 ENCOUNTER — Other Ambulatory Visit: Payer: Self-pay | Admitting: Family Medicine

## 2014-10-30 NOTE — Telephone Encounter (Signed)
Please call in xanax with 1 refills 

## 2014-10-30 NOTE — Telephone Encounter (Signed)
Last seen 08/24/14 [facility]  If approved route to nurse to call into [facility]

## 2014-11-01 ENCOUNTER — Other Ambulatory Visit: Payer: Self-pay | Admitting: Pharmacist

## 2014-11-01 ENCOUNTER — Other Ambulatory Visit: Payer: Medicare Other

## 2014-11-01 DIAGNOSIS — R300 Dysuria: Secondary | ICD-10-CM

## 2014-11-01 MED ORDER — CIPROFLOXACIN HCL 500 MG PO TABS: 500.0000 mg | ORAL_TABLET | Freq: Two times a day (BID) | ORAL | Status: DC

## 2014-11-03 LAB — URINE CULTURE

## 2014-11-08 ENCOUNTER — Ambulatory Visit (INDEPENDENT_AMBULATORY_CARE_PROVIDER_SITE_OTHER): Payer: Medicare Other | Admitting: Nurse Practitioner

## 2014-11-08 ENCOUNTER — Encounter: Payer: Self-pay | Admitting: Nurse Practitioner

## 2014-11-08 VITALS — BP 147/63 | HR 72 | Temp 97.3°F | Ht 62.0 in

## 2014-11-08 DIAGNOSIS — H8103 Meniere's disease, bilateral: Secondary | ICD-10-CM

## 2014-11-08 DIAGNOSIS — H81393 Other peripheral vertigo, bilateral: Secondary | ICD-10-CM

## 2014-11-08 MED ORDER — MECLIZINE HCL 25 MG PO TABS: 25.0000 mg | ORAL_TABLET | Freq: Three times a day (TID) | ORAL | Status: DC | PRN

## 2014-11-08 NOTE — Patient Instructions (Signed)

## 2014-11-08 NOTE — Progress Notes (Signed)
   Subjective:    Patient ID: [name] [name], female    DOB: [birth year], 81 y.o.   MRN: [id number]  Dizziness Pertinent negatives include no headaches.  Headache  Associated symptoms include dizziness.   Patient brought in today by her husband c/o dizziness that started during the night. When she got up out of bed she had trouble keeping her balance.     Review of Systems  Constitutional: Negative.   Respiratory: Negative.   Cardiovascular: Negative.   Gastrointestinal: Negative.   Genitourinary: Negative.   Neurological: Positive for dizziness. Negative for headaches.  Psychiatric/Behavioral: Negative.   All other systems reviewed and are negative.      Objective:   Physical Exam  Constitutional: She is oriented to person, place, and time. She appears well-developed and well-nourished. No distress.  Cardiovascular: Normal rate and normal heart sounds.   Pulmonary/Chest: Effort normal and breath sounds normal.  Neurological: She is alert and oriented to person, place, and time. She has normal reflexes. No cranial nerve deficit.  Skin: Skin is warm and dry.  Psychiatric: She has a normal mood and affect. Her behavior is normal. Judgment and thought content normal.    BP 147/63 mmHg  Pulse 72  Temp(Src) 97.3 F (36.3 C)  Ht 5\' 2"  (1.575 m)       Assessment & Plan:   1. Labyrinthine vertigo, bilateral    Meds ordered this encounter  Medications  . meclizine (ANTIVERT) 25 MG tablet    Sig: Take 1 tablet (25 mg total) by mouth 3 (three) times daily as needed for dizziness.    Dispense:  30 tablet    Refill:  0    Order Specific Question:  Supervising Provider    Answer:  Chipper Herb [[id number]]   Fall precautions Rest RTO prn  Mary-Margaret Hassell Done, FNP

## 2014-11-15 ENCOUNTER — Telehealth: Payer: Self-pay | Admitting: Pharmacist

## 2014-11-15 NOTE — Telephone Encounter (Signed)
appt made

## 2014-11-15 NOTE — Telephone Encounter (Signed)
Needs to see [name] [name] if not better.

## 2014-11-16 ENCOUNTER — Ambulatory Visit (INDEPENDENT_AMBULATORY_CARE_PROVIDER_SITE_OTHER): Payer: Medicare Other | Admitting: Physician Assistant

## 2014-11-16 ENCOUNTER — Encounter: Payer: Self-pay | Admitting: Physician Assistant

## 2014-11-16 VITALS — BP 122/61 | HR 76 | Temp 97.6°F | Ht 62.0 in | Wt 149.0 lb

## 2014-11-16 DIAGNOSIS — L570 Actinic keratosis: Secondary | ICD-10-CM | POA: Diagnosis not present

## 2014-11-16 NOTE — Progress Notes (Signed)
   Subjective:    Patient ID: [name] [name], female    DOB: Nov 17, 1931, 81 y.o.   MRN: [id number]  HPI 81 y/o female presents for recheck of lesions on face, neck and arms. She states that she has a lesion on left side of her neck that will not heal. She has been using TAC cream and was recently started on Lexapro 10mg  for possible neurodermatitis and anxiety. She has decreased itching but is concerned over the lesions on her neck and left side of face that have not healed.     Review of Systems  Skin: Positive for wound (non healing lesion on left jawline and left side of neck. ).       Objective:   Physical Exam  Constitutional: She is oriented to person, place, and time. She appears well-developed and well-nourished.  Neurological: She is alert and oriented to person, place, and time.  Skin:  Excoriations on BUE, left lateral neck , posterior neck, superior to left eyebrow. Left lateral jawline  Psychiatric: She has a normal mood and affect. Her behavior is normal. Judgment and thought content normal.  Nursing note and vitals reviewed.         Assessment & Plan:  1. Actinic keratosis Lesions on face, BUE and left neckline were treated with cryosurgery x 12. I feel that the lesions are a combination of actinic keratosis as well as hyperkeratotic skin lesions that the patient has irritated by "picking", scratching and rubbing the lesions. I have advised her to f/u in 3 weeks if the scaling areas did not resolve with treatment of cryosurgery. At that time, I will biopsy to r/o BCC/SCC. I have also advised her to continue to use Dove soap, triamcinolone as directed for dermatitis of skin. Note that all of the lesions treated today are within reaching distance of patient and lesions are more prominent on the left side of her body - she is right handed whichmakes me think she irritates the lesions by picking   RTO 3 weeks   Anhelica Fowers A. Benjamin Stain PA-C

## 2014-11-16 NOTE — Patient Instructions (Signed)
Continue to use Dove soap If lesions that were treated itch, can use triamcinolone on areas twice daily If lesions treated blister, do not pick, scratch or rub  Return to office in 3-4 weeks for recheck and biopsy if needed

## 2014-11-20 NOTE — Telephone Encounter (Signed)
Patient has appt with Tiffany 8-19

## 2014-11-30 ENCOUNTER — Ambulatory Visit (INDEPENDENT_AMBULATORY_CARE_PROVIDER_SITE_OTHER): Payer: Medicare Other | Admitting: Physician Assistant

## 2014-11-30 ENCOUNTER — Encounter: Payer: Self-pay | Admitting: Physician Assistant

## 2014-11-30 VITALS — BP 149/71 | HR 71 | Temp 96.7°F | Ht 62.0 in | Wt 148.0 lb

## 2014-11-30 DIAGNOSIS — D499 Neoplasm of unspecified behavior of unspecified site: Secondary | ICD-10-CM

## 2014-11-30 MED ORDER — AMLODIPINE BESYLATE 2.5 MG PO TABS: 2.5000 mg | ORAL_TABLET | Freq: Every day | ORAL | Status: DC

## 2014-12-04 ENCOUNTER — Other Ambulatory Visit (INDEPENDENT_AMBULATORY_CARE_PROVIDER_SITE_OTHER): Payer: Medicare Other

## 2014-12-04 DIAGNOSIS — I10 Essential (primary) hypertension: Secondary | ICD-10-CM

## 2014-12-04 DIAGNOSIS — E119 Type 2 diabetes mellitus without complications: Secondary | ICD-10-CM

## 2014-12-04 DIAGNOSIS — E559 Vitamin D deficiency, unspecified: Secondary | ICD-10-CM

## 2014-12-04 LAB — POCT GLYCOSYLATED HEMOGLOBIN (HGB A1C): Hemoglobin A1C: 6.2

## 2014-12-04 NOTE — Progress Notes (Signed)
Lab only 

## 2014-12-05 LAB — HEPATIC FUNCTION PANEL
ALK PHOS: 66 IU/L (ref 39–117)
ALT: 13 IU/L (ref 0–32)
AST: 17 IU/L (ref 0–40)
Albumin: 4.3 g/dL (ref 3.5–4.7)
BILIRUBIN, DIRECT: 0.14 mg/dL (ref 0.00–0.40)
Bilirubin Total: 0.5 mg/dL (ref 0.0–1.2)
TOTAL PROTEIN: 6.4 g/dL (ref 6.0–8.5)

## 2014-12-05 LAB — VITAMIN D 25 HYDROXY (VIT D DEFICIENCY, FRACTURES): Vit D, 25-Hydroxy: 44.5 ng/mL (ref 30.0–100.0)

## 2014-12-05 LAB — PATHOLOGY

## 2014-12-05 LAB — CBC WITH DIFFERENTIAL/PLATELET
BASOS: 0 %
Basophils Absolute: 0 10*3/uL (ref 0.0–0.2)
EOS (ABSOLUTE): 0.1 10*3/uL (ref 0.0–0.4)
EOS: 1 %
HEMATOCRIT: 42 % (ref 34.0–46.6)
HEMOGLOBIN: 13.9 g/dL (ref 11.1–15.9)
IMMATURE GRANULOCYTES: 0 %
Immature Grans (Abs): 0 10*3/uL (ref 0.0–0.1)
Lymphocytes Absolute: 2 10*3/uL (ref 0.7–3.1)
Lymphs: 32 %
MCH: 30.2 pg (ref 26.6–33.0)
MCHC: 33.1 g/dL (ref 31.5–35.7)
MCV: 91 fL (ref 79–97)
MONOCYTES: 7 %
Monocytes Absolute: 0.4 10*3/uL (ref 0.1–0.9)
NEUTROS PCT: 60 %
Neutrophils Absolute: 3.7 10*3/uL (ref 1.4–7.0)
Platelets: 257 10*3/uL (ref 150–379)
RBC: 4.61 x10E6/uL (ref 3.77–5.28)
RDW: 14.1 % (ref 12.3–15.4)
WBC: 6.2 10*3/uL (ref 3.4–10.8)

## 2014-12-05 LAB — LIPID PANEL
CHOLESTEROL TOTAL: 128 mg/dL (ref 100–199)
Chol/HDL Ratio: 2.6 ratio units (ref 0.0–4.4)
HDL: 49 mg/dL (ref >39)
LDL Calculated: 64 mg/dL (ref 0–99)
TRIGLYCERIDES: 74 mg/dL (ref 0–149)
VLDL CHOLESTEROL CAL: 15 mg/dL (ref 5–40)

## 2014-12-05 LAB — BMP8+EGFR
BUN/Creatinine Ratio: 21 (ref 11–26)
BUN: 15 mg/dL (ref 8–27)
CO2: 26 mmol/L (ref 18–29)
CREATININE: 0.72 mg/dL (ref 0.57–1.00)
Calcium: 9.9 mg/dL (ref 8.7–10.3)
Chloride: 97 mmol/L (ref 97–108)
GFR calc Af Amer: 91 mL/min/{1.73_m2} (ref >59)
GFR, EST NON AFRICAN AMERICAN: 79 mL/min/{1.73_m2} (ref >59)
Glucose: 103 mg/dL — ABNORMAL HIGH (ref 65–99)
Potassium: 4.9 mmol/L (ref 3.5–5.2)
Sodium: 138 mmol/L (ref 134–144)

## 2014-12-11 ENCOUNTER — Encounter: Payer: Self-pay | Admitting: Family Medicine

## 2014-12-11 ENCOUNTER — Ambulatory Visit (INDEPENDENT_AMBULATORY_CARE_PROVIDER_SITE_OTHER): Payer: Medicare Other | Admitting: Family Medicine

## 2014-12-11 VITALS — BP 105/61 | HR 65 | Temp 97.4°F | Wt 147.0 lb

## 2014-12-11 DIAGNOSIS — R8290 Unspecified abnormal findings in urine: Secondary | ICD-10-CM | POA: Diagnosis not present

## 2014-12-11 DIAGNOSIS — E119 Type 2 diabetes mellitus without complications: Secondary | ICD-10-CM

## 2014-12-11 DIAGNOSIS — R7309 Other abnormal glucose: Secondary | ICD-10-CM

## 2014-12-11 DIAGNOSIS — R739 Hyperglycemia, unspecified: Secondary | ICD-10-CM

## 2014-12-11 DIAGNOSIS — I10 Essential (primary) hypertension: Secondary | ICD-10-CM

## 2014-12-11 DIAGNOSIS — R531 Weakness: Secondary | ICD-10-CM

## 2014-12-11 LAB — POCT UA - MICROSCOPIC ONLY
Bacteria, U Microscopic: NEGATIVE
Casts, Ur, LPF, POC: NEGATIVE
Crystals, Ur, HPF, POC: NEGATIVE
MUCUS UA: NEGATIVE
RBC, URINE, MICROSCOPIC: NEGATIVE
Yeast, UA: NEGATIVE

## 2014-12-11 LAB — POCT URINALYSIS DIPSTICK
BILIRUBIN UA: NEGATIVE
Blood, UA: NEGATIVE
GLUCOSE UA: NEGATIVE
KETONES UA: NEGATIVE
Nitrite, UA: NEGATIVE
Protein, UA: NEGATIVE
Spec Grav, UA: 1.01
Urobilinogen, UA: NEGATIVE
pH, UA: 6

## 2014-12-11 NOTE — Progress Notes (Signed)
Subjective:     Patient ID: [name] [name], female   DOB: 20-Jun-1931, 81 y.o.   MRN: [id number]  HPI 81 y/o female presents for follow up of "rash" on face, neck and sun exposed areas of BUE. She states that the "rash" and itching have significantly decreased since starting the Lexapro and having some of the lesions treated with cryosurgery at her last appointment. Two of the lesions however (left brow) & left jaw have recurred and sting at times. She also states that she is not picking at the lesions as much as she was a few weeks ago, so she thinks this has helped.    Review of Systems  Constitutional: Negative.   HENT: Negative.   Eyes: Negative.   Respiratory: Negative.   Cardiovascular: Negative.   Gastrointestinal: Negative.   Endocrine: Negative.   Genitourinary: Negative.   Musculoskeletal: Negative.   Skin: Positive for color change, rash and wound.  Allergic/Immunologic: Negative.   Neurological: Negative.   Hematological: Negative.   Psychiatric/Behavioral: Negative.        Objective:   Physical Exam  Skin:  Few scattered macules with base erythema and scale on face. Nonhealing excoriation on left jawline ( .63mm) Recurred area of actinic change on left brow (.74mm)  Excoriations and hyperkeratotic papules on BUE have improved significantly since baseline. Decreased erythema with remaining PIH.        Assessment:     1. Actinic keratoses 2. Neoplasm of unknown behavior     Plan:    1. Neoplasm of unspecified nature  - Pathology Report - Shave biopsy of <.49mm lesion over left brow - Pathology Report - shave biopsy of <.30mm lesion on left jawline   Continue current medication. Do not scratch, rub, pick at the lesions biopsied or healing lesions on BUE.   Will await pathology to determine further treatment plan.   Braulio Kiedrowski A. Benjamin Stain PA-C

## 2014-12-11 NOTE — Progress Notes (Signed)
Subjective:    Patient ID: [name] [name], female    DOB: September 27, 1931, 81 y.o.   MRN: [id number]  HPI Pt here for follow up and management of chronic medical problems. Which includes hypertension and diabetes. She is taking her medications regularly. The patient is in good spirits today. She is coughing less but still coughing sometimes since she was taken off of lisinopril and switch to amlodipine. The skin lesions and sores that she has had appeared to be improving though not completely well and this seems to be secondary to the use of Bactroban are mupirocin ointment. All her labs were reviewed with her during the visit today. She brings in blood sugars for review and all of these were good. The hemoglobin A1c was actually in the prediabetic range at 6.1%. The patient denies chest pain or shortness of breath or trouble swallowing or heartburn or indigestion or nausea vomiting diarrhea or blood in the stool. She is passing her water without problems .       Patient Active Problem List   Diagnosis Date Noted  . Type 2 diabetes mellitus, controlled 08/30/2014  . Rectal bleeding 03/07/2014  . BRBPR (bright red blood per rectum) 03/07/2014  . History of total left hip arthroplasty 01/25/2014  . Elevated liver enzymes 01/13/2014  . Edema 01/02/2014  . Hypoxia 01/02/2014  . S/P total hip arthroplasty 12/27/2013  . Chronic cough 10/26/2013  . Hyperlipidemia   . Anxiety   . Depressive disorder, not elsewhere classified   . Rhinitis   . Elevated blood sugar   . Atrophic vaginitis   . Postmenopausal    Outpatient Encounter Prescriptions as of 12/11/2014  Medication Sig  . ALPRAZolam (XANAX) 0.25 MG tablet TAKE ONE TABLET BY MOUTH TWICE DAILY AS NEEDED FOR ANXIETY  . amLODipine (NORVASC) 2.5 MG tablet Take 1 tablet (2.5 mg total) by mouth daily.  Marland Kitchen aspirin 81 MG tablet Take 81 mg by mouth daily.  . budesonide-formoterol (SYMBICORT) 160-4.5 MCG/ACT inhaler Inhale 2 puffs into the lungs 2  (two) times daily.  . calcium carbonate (OS-CAL) 600 MG TABS tablet Take 600 mg by mouth 2 (two) times daily with a meal.  . cholecalciferol (VITAMIN D) 1000 UNITS tablet Take 2,000 Units by mouth daily.  Marland Kitchen escitalopram (LEXAPRO) 10 MG tablet Take 1 tablet (10 mg total) by mouth daily. AS DIRECTED  . fluticasone (FLONASE) 50 MCG/ACT nasal spray Place 1 spray into both nostrils daily.  Marland Kitchen glucose blood test strip Use to check BG once a day to every other day.  Dx:  E11.9  . lovastatin (MEVACOR) 40 MG tablet TAKE ONE TABLET BY MOUTH every other day.  . meclizine (ANTIVERT) 25 MG tablet Take 1 tablet (25 mg total) by mouth 3 (three) times daily as needed for dizziness.  . metFORMIN (GLUCOPHAGE XR) 500 MG 24 hr tablet Take 1 tablet (500 mg total) by mouth daily with breakfast.  . mupirocin ointment (BACTROBAN) 2 % Apply 1 application topically 3 (three) times daily. Use for 10 days  . Omega-3 Fatty Acids (FISH OIL) 1000 MG CAPS Take 2 capsules by mouth daily.  [name] [name] DELICA LANCETS 65H MISC Use to check BG once a day to every other day Dx: E 11.9  . clindamycin (CLEOCIN T) 1 % lotion Apply topically daily.  . fluticasone (CUTIVATE) 0.005 % ointment Apply 1 application topically 2 (two) times daily. Apply for 1 week then stop for 1 week.  . triamcinolone cream (KENALOG) 0.1 % Apply  1 application topically 2 (two) times daily. For 1 week then off for 1 week. (Patient not taking: Reported on 12/11/2014)   No facility-administered encounter medications on file as of 12/11/2014.      Review of Systems  Constitutional: Negative.   HENT: Negative.   Eyes: Negative.   Respiratory: Positive for cough.   Cardiovascular: Negative.   Gastrointestinal: Negative.   Endocrine: Negative.   Genitourinary:       Urine has change in color and has odor.   Musculoskeletal: Negative.   Skin: Negative.   Allergic/Immunologic: Negative.   Neurological: Negative.   Hematological: Negative.     Psychiatric/Behavioral: Negative.        Objective:   Physical Exam  Constitutional: She is oriented to person, place, and time. She appears well-developed and well-nourished. No distress.  Alert and calm  HENT:  Head: Normocephalic and atraumatic.  Right Ear: External ear normal.  Left Ear: External ear normal.  Nose: Nose normal.  Mouth/Throat: Oropharynx is clear and moist. No oropharyngeal exudate.  Eyes: Conjunctivae and EOM are normal. Pupils are equal, round, and reactive to light. Right eye exhibits no discharge. Left eye exhibits no discharge. No scleral icterus.  Neck: Normal range of motion. Neck supple. No thyromegaly present.  No carotid bruits or thyromegaly or anterior cervical adenopathy  Cardiovascular: Normal rate, regular rhythm, normal heart sounds and intact distal pulses.   No murmur heard. Heart is regular at 72/m  Pulmonary/Chest: Effort normal and breath sounds normal. No respiratory distress. She has no wheezes. She has no rales. She exhibits no tenderness.  Clear anteriorly and posteriorly  Abdominal: Soft. Bowel sounds are normal. She exhibits no mass. There is no tenderness. There is no rebound and no guarding.  There is an umbilical hernia but no masses tenderness or organ enlargement  Musculoskeletal: Normal range of motion. She exhibits no edema or tenderness.  Lymphadenopathy:    She has no cervical adenopathy.  Neurological: She is alert and oriented to person, place, and time.  Skin: Skin is warm and dry. Rash noted. No erythema. No pallor.  There are a few areas of excoriation one on the posterior neck one on the left side of the face and a couple on the arms. All of the skin lesions appear much improved.  Psychiatric: She has a normal mood and affect. Her behavior is normal. Judgment and thought content normal.  Nursing note and vitals reviewed.   BP 105/61 mmHg  Pulse 65  Temp(Src) 97.4 F (36.3 C) (Oral)  Wt 147 lb (66.679 kg)       Assessment & Plan:  1. Essential hypertension -The blood pressure is good today and she will continue with the current dose of amlodipine 2.5 mg daily.  2. Elevated blood sugar -The hemoglobin A1c on the lab work was much improved and she will continue with metformin 500 mg daily  3. Type 2 diabetes mellitus, controlled -Continue with aggressive therapeutic lifestyle changes and metformin  4. Weakness -The weakness appears to be better and no further changes will be made in her treatment regimen.  5. Abnormal urine odor -She is not having any symptoms passing her water but we will check a urine because of the unusual odor that it has. - POCT UA - Microscopic Only - POCT urinalysis dipstick - Urine culture  Patient Instructions  Continue to use Bactroban ointment sparingly on the remaining skin lesions that she has a couple times daily Use inhaler regularly and drink plenty  of water Continue with current blood pressure medication Continue with current dose of vitamin D Continue with lovastatin   Arrie Senate MD  .

## 2014-12-11 NOTE — Patient Instructions (Signed)
Continue to use Bactroban ointment sparingly on the remaining skin lesions that she has a couple times daily Use inhaler regularly and drink plenty of water Continue with current blood pressure medication Continue with current dose of vitamin D Continue with lovastatin

## 2014-12-13 LAB — URINE CULTURE

## 2014-12-19 ENCOUNTER — Encounter: Payer: Self-pay | Admitting: Physician Assistant

## 2014-12-19 ENCOUNTER — Ambulatory Visit (INDEPENDENT_AMBULATORY_CARE_PROVIDER_SITE_OTHER): Payer: Medicare Other | Admitting: Physician Assistant

## 2014-12-19 VITALS — BP 108/59 | HR 65 | Temp 96.8°F | Ht 62.0 in | Wt 148.0 lb

## 2014-12-19 DIAGNOSIS — D485 Neoplasm of uncertain behavior of skin: Secondary | ICD-10-CM | POA: Diagnosis not present

## 2014-12-19 NOTE — Progress Notes (Signed)
Patient ID: [name] [name], female   DOB: [birth year], 81 y.o.   MRN: [id number]  81 y/o female presents for non healing lesion on posterior neck. The lesion has been treated twice with cryosurgery. She has been putting mupirocin on the lesion twice daily . Other lesions that were treated with cryo or biopsied have healed with no problem.   Hyperkeratotic excoriation on posterior neck, 1-1.5cm in size with surrounding erythema was biopsied to r/o dysplasia. Monsels and bandage was applied. I will determine treatment plan pending pathology results. I have advised her to keep it covered with nonstick bandage, using Mupirocin BID-TID x 14 days.   She will follow up in 2 weeks for recheck.   [name] [name] A. Benjamin Stain PA-C

## 2014-12-21 ENCOUNTER — Other Ambulatory Visit: Payer: Self-pay | Admitting: Family Medicine

## 2014-12-26 LAB — PATHOLOGY

## 2014-12-29 ENCOUNTER — Other Ambulatory Visit: Payer: Self-pay | Admitting: Nurse Practitioner

## 2014-12-31 NOTE — Telephone Encounter (Signed)
Last filled 11/29/14, last seen 12/11/14. Call in at [facility]

## 2015-01-01 ENCOUNTER — Ambulatory Visit: Payer: Medicare Other | Admitting: Family Medicine

## 2015-01-02 ENCOUNTER — Encounter: Payer: Self-pay | Admitting: Physician Assistant

## 2015-01-02 ENCOUNTER — Ambulatory Visit (INDEPENDENT_AMBULATORY_CARE_PROVIDER_SITE_OTHER): Payer: Medicare Other | Admitting: Physician Assistant

## 2015-01-02 VITALS — BP 106/56 | HR 63 | Temp 97.0°F | Ht 62.0 in | Wt 149.0 lb

## 2015-01-02 DIAGNOSIS — L309 Dermatitis, unspecified: Secondary | ICD-10-CM

## 2015-01-02 DIAGNOSIS — R8290 Unspecified abnormal findings in urine: Secondary | ICD-10-CM | POA: Diagnosis not present

## 2015-01-02 NOTE — Progress Notes (Signed)
Patient ID: [name] [name], female   DOB: 1931-12-11, 81 y.o.   MRN: [id number]  81 y/o female presents for follow up of dermatitis on BUE and neck. The areas have been treated with cryosurgery over the past few months for resolution of itch. Patient admits she has been trying to not pick at the lesions, has had much improvement and is pleased with the results She is not having as much itching. At her last visit, a lesion on her posterior neckline was biopsied to to r/o BCC/SCC. Biopsy confirmed mild fibrosis and inflammation with overlying ulceration. She is using TAC BID for itch and applying Mupirocin ointment to the healing lesion on her posterior neckline.   PE reveals healing ulceration on posterior neckline along with annular lesions of PIH on bilateral arms below her shirt line. No new excoriations.   A/P: Dermatitis: Patient may continue to use TAC BID prn for itch and irritation of lesions on BUE and neck. I have suggested she continue to keep the healing lesion on her neck covered with bandaid to prevent  Irritation from clothing.   F/U prn if s/s worsen or do not improve.   Tiffany A. Benjamin Stain PA-C

## 2015-01-04 ENCOUNTER — Other Ambulatory Visit: Payer: Self-pay | Admitting: Family Medicine

## 2015-01-05 LAB — URINE CULTURE

## 2015-01-10 ENCOUNTER — Ambulatory Visit (INDEPENDENT_AMBULATORY_CARE_PROVIDER_SITE_OTHER): Payer: Medicare Other

## 2015-01-10 DIAGNOSIS — Z23 Encounter for immunization: Secondary | ICD-10-CM

## 2015-01-11 NOTE — Progress Notes (Signed)
Patient informed and voices understanding 

## 2015-01-25 ENCOUNTER — Encounter: Payer: Medicare Other | Admitting: Family Medicine

## 2015-02-14 ENCOUNTER — Telehealth: Payer: Self-pay | Admitting: Pediatrics

## 2015-02-14 DIAGNOSIS — L989 Disorder of the skin and subcutaneous tissue, unspecified: Secondary | ICD-10-CM

## 2015-02-14 MED ORDER — MUPIROCIN 2 % EX OINT: 1.0000 "application " | TOPICAL_OINTMENT | Freq: Two times a day (BID) | CUTANEOUS | Status: DC

## 2015-02-15 NOTE — Telephone Encounter (Signed)
Called pt back, OK to continue mupirocin, can use what she has at home. Will take time for the sore areas to heal. Has some hyperpigmentation and hypertrophic scarring around some of the areas that were burned off. One open area 79mm wide by 3 mm shallow ulceration surrounded by pink hypertrophic scar on back of neck.

## 2015-02-20 ENCOUNTER — Ambulatory Visit (INDEPENDENT_AMBULATORY_CARE_PROVIDER_SITE_OTHER): Payer: Medicare Other | Admitting: Family Medicine

## 2015-02-20 ENCOUNTER — Encounter: Payer: Self-pay | Admitting: Family Medicine

## 2015-02-20 VITALS — BP 108/66 | HR 73 | Temp 97.3°F | Ht 62.0 in | Wt 147.0 lb

## 2015-02-20 DIAGNOSIS — J40 Bronchitis, not specified as acute or chronic: Secondary | ICD-10-CM

## 2015-02-20 DIAGNOSIS — J209 Acute bronchitis, unspecified: Secondary | ICD-10-CM

## 2015-02-20 DIAGNOSIS — L298 Other pruritus: Secondary | ICD-10-CM | POA: Diagnosis not present

## 2015-02-20 MED ORDER — AZITHROMYCIN 250 MG PO TABS: ORAL_TABLET | ORAL | Status: DC

## 2015-02-20 MED ORDER — ALBUTEROL SULFATE HFA 108 (90 BASE) MCG/ACT IN AERS: 2.0000 | INHALATION_SPRAY | Freq: Four times a day (QID) | RESPIRATORY_TRACT | Status: DC | PRN

## 2015-02-20 NOTE — Progress Notes (Signed)
Subjective:    Patient ID: [name] [name], female    DOB: 11-20-1931, 81 y.o.   MRN: [id number]  HPI Patient here today for on-going cough and some head congestion. She is still complaining about her skin sores as well. The patient has had this ongoing skin rash for months and has seen at least a couple specialists with no relief for this. Today she continues to complain with this. She is also been having cough and congestion that is not productive. She is not using the Symbicort regularly. A lot of the congestion is in her head. She denies any chest pain or shortness of breath other than what she has with a cough. She's not having any problems with her GI tract or urinary tract.      Patient Active Problem List   Diagnosis Date Noted  . Type 2 diabetes mellitus, controlled ([place]) 08/30/2014  . Rectal bleeding 03/07/2014  . BRBPR (bright red blood per rectum) 03/07/2014  . History of total left hip arthroplasty 01/25/2014  . Elevated liver enzymes 01/13/2014  . Edema 01/02/2014  . Hypoxia 01/02/2014  . S/P total hip arthroplasty 12/27/2013  . Chronic cough 10/26/2013  . Hyperlipidemia   . Anxiety   . Depressive disorder, not elsewhere classified   . Rhinitis   . Elevated blood sugar   . Atrophic vaginitis   . Postmenopausal    Outpatient Encounter Prescriptions as of 02/20/2015  Medication Sig  . ALPRAZolam (XANAX) 0.25 MG tablet TAKE ONE TABLET BY MOUTH TWICE DAILY  . amLODipine (NORVASC) 2.5 MG tablet Take 1 tablet (2.5 mg total) by mouth daily.  Marland Kitchen aspirin 81 MG tablet Take 81 mg by mouth daily.  . budesonide-formoterol (SYMBICORT) 160-4.5 MCG/ACT inhaler Inhale 2 puffs into the lungs 2 (two) times daily.  . calcium carbonate (OS-CAL) 600 MG TABS tablet Take 600 mg by mouth 2 (two) times daily with a meal.  . cholecalciferol (VITAMIN D) 1000 UNITS tablet Take 2,000 Units by mouth daily.  Marland Kitchen escitalopram (LEXAPRO) 10 MG tablet TAKE ONE TABLET BY MOUTH EVERY DAY AS DIRECTED  .  fluticasone (FLONASE) 50 MCG/ACT nasal spray Place 1 spray into both nostrils daily.  Marland Kitchen glucose blood test strip Use to check BG once a day to every other day.  Dx:  E11.9  . lovastatin (MEVACOR) 40 MG tablet TAKE ONE TABLET BY MOUTH every other day.  . metFORMIN (GLUCOPHAGE XR) 500 MG 24 hr tablet Take 1 tablet (500 mg total) by mouth daily with breakfast.  . mupirocin ointment (BACTROBAN) 2 % APPLY TO AFFECTED AREAS 3 TIMES DAILY FOR 10 DAYS  . mupirocin ointment (BACTROBAN) 2 % Place 1 application into the nose 2 (two) times daily.  . Omega-3 Fatty Acids (FISH OIL) 1000 MG CAPS Take 2 capsules by mouth daily.  Glory Rosebush DELICA LANCETS 95A MISC Use to check BG once a day to every other day Dx: E 11.9  . [DISCONTINUED] meclizine (ANTIVERT) 25 MG tablet Take 1 tablet (25 mg total) by mouth 3 (three) times daily as needed for dizziness.  . [DISCONTINUED] triamcinolone cream (KENALOG) 0.1 % Apply 1 application topically 2 (two) times daily. For 1 week then off for 1 week.  . [DISCONTINUED] clindamycin (CLEOCIN T) 1 % lotion Apply topically daily.  . [DISCONTINUED] fluticasone (CUTIVATE) 0.005 % ointment Apply 1 application topically 2 (two) times daily. Apply for 1 week then stop for 1 week.   No facility-administered encounter medications on file as of 02/20/2015.  Review of Systems  Constitutional: Negative.   HENT: Positive for congestion (head).   Eyes: Negative.   Respiratory: Positive for cough (on- going).   Cardiovascular: Negative.   Gastrointestinal: Negative.   Endocrine: Negative.   Genitourinary: Negative.   Musculoskeletal: Negative.   Skin: Negative.        Several skin lesions  Allergic/Immunologic: Negative.   Neurological: Negative.   Hematological: Negative.   Psychiatric/Behavioral: Negative.        Objective:   Physical Exam  Constitutional: She is oriented to person, place, and time. She appears well-developed and well-nourished.  HENT:  Head:  Normocephalic and atraumatic.  Right Ear: External ear normal.  Left Ear: External ear normal.  Mouth/Throat: Oropharynx is clear and moist. No oropharyngeal exudate.  Eyes: Conjunctivae and EOM are normal. Pupils are equal, round, and reactive to light. Right eye exhibits no discharge. Left eye exhibits no discharge. No scleral icterus.  Neck: Normal range of motion. Neck supple. No thyromegaly present.  Cardiovascular: Normal rate, regular rhythm and normal heart sounds.   No murmur heard. Pulmonary/Chest: Effort normal and breath sounds normal. No respiratory distress. She has no wheezes. She has no rales.  Dry cough no wheezes rales or rhonchi  Abdominal: She exhibits no mass.  Musculoskeletal: Normal range of motion. She exhibits no edema.  Lymphadenopathy:    She has no cervical adenopathy.  Neurological: She is alert and oriented to person, place, and time.  Skin: Skin is warm and dry. Rash noted. There is erythema.  The patient has an excoriated and erythematous rash scattered over her body.  Psychiatric: She has a normal mood and affect. Her behavior is normal. Judgment and thought content normal.  Nursing note and vitals reviewed.  BP 108/66 mmHg  Pulse 73  Temp(Src) 97.3 F (36.3 C) (Oral)  Ht 5\' 2"  (1.575 m)  Wt 147 lb (66.679 kg)  BMI 26.88 kg/m2        Assessment & Plan:  1. Chronic pruritic rash in adult - Ambulatory referral to Dermatology  2. Bronchitis with bronchospasm -Take antibiotic as directed and use Symbicort regularly and rescue inhaler of albuterol as needed -Drink plenty of fluids. Use nasal saline frequently in each nostril and take Mucinex for cough and congestion as per handout  Meds ordered this encounter  Medications  . azithromycin (ZITHROMAX) 250 MG tablet    Sig: As directed    Dispense:  6 tablet    Refill:  0  . albuterol (PROVENTIL HFA;VENTOLIN HFA) 108 (90 BASE) MCG/ACT inhaler    Sig: Inhale 2 puffs into the lungs every 6 (six)  hours as needed for wheezing or shortness of breath.    Dispense:  1 Inhaler    Refill:  4   Patient Instructions  Use the symbicort and metformin regularly Take the antibiotic Keep the house cool  Drink plenty of fluids We will refer patient to dermatologist at [facility]   Arrie Senate MD

## 2015-02-20 NOTE — Patient Instructions (Addendum)
Use the symbicort and metformin regularly Take the antibiotic Keep the house cool  Drink plenty of fluids We will refer patient to dermatologist at [facility]

## 2015-02-26 ENCOUNTER — Other Ambulatory Visit: Payer: Self-pay | Admitting: Pharmacist

## 2015-03-05 ENCOUNTER — Other Ambulatory Visit: Payer: Self-pay | Admitting: Family Medicine

## 2015-03-27 ENCOUNTER — Other Ambulatory Visit: Payer: Self-pay | Admitting: Family Medicine

## 2015-03-28 NOTE — Telephone Encounter (Signed)
Last seen 02/20/15 DW[facility]   If approved route to nurse to call into [facility]

## 2015-03-29 ENCOUNTER — Telehealth: Payer: Self-pay | Admitting: Family Medicine

## 2015-03-29 NOTE — Telephone Encounter (Signed)
Pharm to fix RX - should have been #60 and a refill

## 2015-04-09 ENCOUNTER — Other Ambulatory Visit (INDEPENDENT_AMBULATORY_CARE_PROVIDER_SITE_OTHER): Payer: Medicare Other

## 2015-04-09 ENCOUNTER — Other Ambulatory Visit: Payer: Self-pay | Admitting: *Deleted

## 2015-04-09 DIAGNOSIS — I10 Essential (primary) hypertension: Secondary | ICD-10-CM

## 2015-04-09 DIAGNOSIS — E118 Type 2 diabetes mellitus with unspecified complications: Secondary | ICD-10-CM

## 2015-04-09 DIAGNOSIS — E559 Vitamin D deficiency, unspecified: Secondary | ICD-10-CM

## 2015-04-09 DIAGNOSIS — N3001 Acute cystitis with hematuria: Secondary | ICD-10-CM

## 2015-04-09 LAB — POCT URINALYSIS DIPSTICK
Bilirubin, UA: NEGATIVE
Glucose, UA: NEGATIVE
KETONES UA: NEGATIVE
Nitrite, UA: NEGATIVE
PH UA: 5
PROTEIN UA: NEGATIVE
SPEC GRAV UA: 1.015
Urobilinogen, UA: NEGATIVE

## 2015-04-09 LAB — POCT UA - MICROSCOPIC ONLY
CASTS, UR, LPF, POC: NEGATIVE
Crystals, Ur, HPF, POC: NEGATIVE

## 2015-04-09 LAB — POCT GLYCOSYLATED HEMOGLOBIN (HGB A1C): Hemoglobin A1C: 6.5

## 2015-04-09 MED ORDER — CIPROFLOXACIN HCL 500 MG PO TABS: 500.0000 mg | ORAL_TABLET | Freq: Two times a day (BID) | ORAL | Status: DC

## 2015-04-09 NOTE — Addendum Note (Signed)
Addended by: Earlene Plater on: 04/09/2015 09:52 AM   Modules accepted: Orders

## 2015-04-09 NOTE — Progress Notes (Signed)
Lab only 

## 2015-04-11 LAB — URINE CULTURE

## 2015-04-12 LAB — HEPATIC FUNCTION PANEL
ALK PHOS: 84 IU/L (ref 39–117)
ALT: 22 IU/L (ref 0–32)
AST: 23 IU/L (ref 0–40)
Albumin: 4.1 g/dL (ref 3.5–4.7)
Bilirubin Total: 0.7 mg/dL (ref 0.0–1.2)
Bilirubin, Direct: 0.2 mg/dL (ref 0.00–0.40)
TOTAL PROTEIN: 6.4 g/dL (ref 6.0–8.5)

## 2015-04-12 LAB — CBC WITH DIFFERENTIAL/PLATELET
BASOS ABS: 0 10*3/uL (ref 0.0–0.2)
Basos: 0 %
EOS (ABSOLUTE): 0.1 10*3/uL (ref 0.0–0.4)
Eos: 2 %
HEMATOCRIT: 42.9 % (ref 34.0–46.6)
HEMOGLOBIN: 14.5 g/dL (ref 11.1–15.9)
Immature Grans (Abs): 0 10*3/uL (ref 0.0–0.1)
Immature Granulocytes: 0 %
LYMPHS ABS: 2.4 10*3/uL (ref 0.7–3.1)
Lymphs: 31 %
MCH: 31.1 pg (ref 26.6–33.0)
MCHC: 33.8 g/dL (ref 31.5–35.7)
MCV: 92 fL (ref 79–97)
MONOCYTES: 7 %
MONOS ABS: 0.5 10*3/uL (ref 0.1–0.9)
NEUTROS ABS: 4.6 10*3/uL (ref 1.4–7.0)
Neutrophils: 60 %
Platelets: 303 10*3/uL (ref 150–379)
RBC: 4.66 x10E6/uL (ref 3.77–5.28)
RDW: 13.9 % (ref 12.3–15.4)
WBC: 7.7 10*3/uL (ref 3.4–10.8)

## 2015-04-12 LAB — NMR, LIPOPROFILE
Cholesterol: 156 mg/dL (ref 100–199)
HDL Cholesterol by NMR: 55 mg/dL (ref >39)
HDL Particle Number: 29.5 umol/L — ABNORMAL LOW (ref >=30.5)
LDL PARTICLE NUMBER: 1081 nmol/L — AB (ref <1000)
LDL Size: 20.6 nm (ref >20.5)
LDL-C: 82 mg/dL (ref 0–99)
LP-IR SCORE: 44 (ref <=45)
Small LDL Particle Number: 424 nmol/L (ref <=527)
Triglycerides by NMR: 93 mg/dL (ref 0–149)

## 2015-04-12 LAB — BMP8+EGFR
BUN / CREAT RATIO: 17 (ref 11–26)
BUN: 15 mg/dL (ref 8–27)
CHLORIDE: 100 mmol/L (ref 96–106)
CO2: 24 mmol/L (ref 18–29)
Calcium: 9.7 mg/dL (ref 8.7–10.3)
Creatinine, Ser: 0.87 mg/dL (ref 0.57–1.00)
GFR calc non Af Amer: 63 mL/min/{1.73_m2} (ref >59)
GFR, EST AFRICAN AMERICAN: 72 mL/min/{1.73_m2} (ref >59)
GLUCOSE: 108 mg/dL — AB (ref 65–99)
Potassium: 4.7 mmol/L (ref 3.5–5.2)
SODIUM: 142 mmol/L (ref 134–144)

## 2015-04-12 LAB — VITAMIN D 25 HYDROXY (VIT D DEFICIENCY, FRACTURES): VIT D 25 HYDROXY: 42.5 ng/mL (ref 30.0–100.0)

## 2015-04-16 ENCOUNTER — Encounter: Payer: Self-pay | Admitting: Family Medicine

## 2015-04-16 ENCOUNTER — Ambulatory Visit (INDEPENDENT_AMBULATORY_CARE_PROVIDER_SITE_OTHER): Payer: Medicare Other | Admitting: Family Medicine

## 2015-04-16 VITALS — BP 127/56 | HR 76 | Temp 97.4°F | Ht 62.0 in | Wt 151.0 lb

## 2015-04-16 DIAGNOSIS — I10 Essential (primary) hypertension: Secondary | ICD-10-CM | POA: Diagnosis not present

## 2015-04-16 DIAGNOSIS — L298 Other pruritus: Secondary | ICD-10-CM | POA: Diagnosis not present

## 2015-04-16 DIAGNOSIS — E118 Type 2 diabetes mellitus with unspecified complications: Secondary | ICD-10-CM | POA: Diagnosis not present

## 2015-04-16 MED ORDER — CEPHALEXIN 500 MG PO CAPS: 500.0000 mg | ORAL_CAPSULE | Freq: Two times a day (BID) | ORAL | Status: DC

## 2015-04-16 NOTE — Addendum Note (Signed)
Addended by: Michaela Corner on: 04/16/2015 09:06 AM   Modules accepted: Orders

## 2015-04-16 NOTE — Patient Instructions (Addendum)
Medicare Annual Wellness Visit  [facility] and the medical providers at [facility] strive to bring you the best medical care.  In doing so we not only want to address your current medical conditions and concerns but also to detect new conditions early and prevent illness, disease and health-related problems.    Medicare offers a yearly Wellness Visit which allows our clinical staff to assess your need for preventative services including immunizations, lifestyle education, counseling to decrease risk of preventable diseases and screening for fall risk and other medical concerns.    This visit is provided free of charge (no copay) for all Medicare recipients. The clinical pharmacists at [facility] have begun to conduct these Wellness Visits which will also include a thorough review of all your medications.    As you primary medical provider recommend that you make an appointment for your Annual Wellness Visit if you have not done so already this year.  You may set up this appointment before you leave today or you may call back [phone]) and schedule an appointment.  Please make sure when you call that you mention that you are scheduling your Annual Wellness Visit with the clinical pharmacist so that the appointment may be made for the proper length of time.     Continue current medications. Continue good therapeutic lifestyle changes which include good diet and exercise. Fall precautions discussed with patient. If an FOBT was given today- please return it to our front desk. If you are over 73 years old - you may need Prevnar 60 or the adult Pneumonia vaccine.  **Flu shots are available--- please call and schedule a FLU-CLINIC appointment**  After your visit with [place] today you will receive a survey in the mail or online from [employer] regarding your care with [place]. Please take a moment to fill this out. Your feedback is very  important to [place] as you can help [place] better understand your patient needs as well as improve your experience and satisfaction. WE CARE ABOUT YOU!!!   Take medications regularly Use inhaler more regularly  Keep appointment with dermatologist Take gabapentin every 8 hours

## 2015-04-16 NOTE — Progress Notes (Signed)
Subjective:    Patient ID: [name] [name], female    DOB: 05-25-1931, 82 y.o.   MRN: [id number]  HPI Pt here for follow up and management of chronic medical problems which includes diabetes. She is taking medications regularly. The patient still has a pruritic rash. She saw Dr. Leonie Green at [facility]. She's been taking gabapentin but not as the bottle directed and only taken it twice a day. She is instructed that she is is to be taking it 3 times a day or every 8 hours. She will start doing this before anything else is done and she has an appointment to follow-up with Dr. Leonie Green around March 1. She denies any chest pain or shortness of breath anymore than usual she is swallowing her food without problems and not having any heartburn indigestion and nausea vomiting diarrhea or blood in the stool. She is passing her water currently without problems but she is still taking cephalexin and finished her Cipro. We will check another urine when this is completed. She will continue to drink plenty of fluids. Her recent lab work was reviewed with her and her A1c was 6.5% and this was good and she will continue with the metformin. Her LDL particle number was slightly elevated at 1081 and she will continue with her lovastatin and more aggressive therapeutic lifestyle changes. Liver function tests were good her CBC was normal and her kidney function was normal. She was given a copy of this report to take home with her.      Patient Active Problem List   Diagnosis Date Noted  . Type 2 diabetes mellitus, controlled ([place]) 08/30/2014  . Rectal bleeding 03/07/2014  . BRBPR (bright red blood per rectum) 03/07/2014  . History of total left hip arthroplasty 01/25/2014  . Elevated liver enzymes 01/13/2014  . Edema 01/02/2014  . Hypoxia 01/02/2014  . S/P total hip arthroplasty 12/27/2013  . Chronic cough 10/26/2013  . Hyperlipidemia   . Anxiety   . Depressive disorder, not  elsewhere classified   . Rhinitis   . Elevated blood sugar   . Atrophic vaginitis   . Postmenopausal    Outpatient Encounter Prescriptions as of 04/16/2015  Medication Sig  . albuterol (PROVENTIL HFA;VENTOLIN HFA) 108 (90 BASE) MCG/ACT inhaler Inhale 2 puffs into the lungs every 6 (six) hours as needed for wheezing or shortness of breath.  . ALPRAZolam (XANAX) 0.25 MG tablet TAKE ONE TABLET BY MOUTH TWICE DAILY AS NEEDED  . amLODipine (NORVASC) 2.5 MG tablet Take 1 tablet (2.5 mg total) by mouth daily.  Marland Kitchen aspirin 81 MG tablet Take 81 mg by mouth daily.  . budesonide-formoterol (SYMBICORT) 160-4.5 MCG/ACT inhaler Inhale 2 puffs into the lungs 2 (two) times daily.  . calcium carbonate (OS-CAL) 600 MG TABS tablet Take 600 mg by mouth 2 (two) times daily with a meal.  . cephALEXin (KEFLEX) 500 MG capsule Take 1 capsule (500 mg total) by mouth 2 (two) times daily.  . cholecalciferol (VITAMIN D) 1000 UNITS tablet Take 2,000 Units by mouth daily.  Marland Kitchen escitalopram (LEXAPRO) 10 MG tablet TAKE ONE TABLET BY MOUTH EVERY DAY AS DIRECTED  . fluticasone (FLONASE) 50 MCG/ACT nasal spray Place 1 spray into both nostrils daily.  Marland Kitchen glucose blood test strip Use to check BG once a day to every other day.  Dx:  E11.9  . lovastatin (MEVACOR) 40 MG tablet TAKE ONE TABLET BY MOUTH every other day.  . metFORMIN (GLUCOPHAGE-XR) 500 MG 24  hr tablet TAKE ONE TABLET BY MOUTH DAILY WITH BREAKFAST  . mupirocin ointment (BACTROBAN) 2 % APPLY TO AFFECTED AREAS 3 TIMES DAILY FOR 10 DAYS  . Omega-3 Fatty Acids (FISH OIL) 1000 MG CAPS Take 2 capsules by mouth daily.  Glory Rosebush DELICA LANCETS 99991111 MISC Use to check BG once a day to every other day Dx: E 11.9  . [DISCONTINUED] azithromycin (ZITHROMAX) 250 MG tablet As directed  . [DISCONTINUED] ciprofloxacin (CIPRO) 500 MG tablet Take 1 tablet (500 mg total) by mouth 2 (two) times daily.  . [DISCONTINUED] mupirocin ointment (BACTROBAN) 2 % Place 1 application into the nose 2 (two)  times daily.   No facility-administered encounter medications on file as of 04/16/2015.      Review of Systems  Constitutional: Negative.   HENT: Negative.   Eyes: Negative.   Respiratory: Negative.   Cardiovascular: Negative.   Gastrointestinal: Negative.   Endocrine: Negative.   Genitourinary: Negative.   Musculoskeletal: Negative.   Skin: Positive for rash.  Allergic/Immunologic: Negative.   Neurological: Negative.   Hematological: Negative.   Psychiatric/Behavioral: Negative.        Objective:   Physical Exam  Constitutional: She is oriented to person, place, and time. No distress.  Thin and elderly but alert  HENT:  Head: Normocephalic and atraumatic.  Right Ear: External ear normal.  Left Ear: External ear normal.  Nose: Nose normal.  Mouth/Throat: Oropharynx is clear and moist.  Eyes: Conjunctivae and EOM are normal. Pupils are equal, round, and reactive to light. Right eye exhibits no discharge. Left eye exhibits no discharge. No scleral icterus.  Neck: Normal range of motion. Neck supple. No thyromegaly present.  Cardiovascular: Normal rate, regular rhythm, normal heart sounds and intact distal pulses.   No murmur heard. The heart is regular at 72/m  Pulmonary/Chest: Effort normal and breath sounds normal. No respiratory distress. She has no wheezes. She has no rales. She exhibits no tenderness.  Clear anteriorly and posteriorly and no wheezing or rales  Abdominal: Soft. Bowel sounds are normal. She exhibits no mass. There is no tenderness. There is no rebound and no guarding.  No bruits but protuberant and no organ enlargement or masses  Musculoskeletal: Normal range of motion. She exhibits no edema or tenderness.  Gait is somewhat hesitant but improved from the last visit secondary to hip surgery in the past several months  Lymphadenopathy:    She has no cervical adenopathy.  Neurological: She is alert and oriented to person, place, and time. She has normal  reflexes. No cranial nerve deficit.  Skin: Skin is warm and dry. Rash noted. There is erythema.  The patient continues to have this chronic pruritic rash but it does appear somewhat better and in a more healed fashion today.  Psychiatric: She has a normal mood and affect. Her behavior is normal. Judgment and thought content normal.  Nursing note and vitals reviewed.    BP 127/56 mmHg  Pulse 76  Temp(Src) 97.4 F (36.3 C) (Oral)  Ht 5\' 2"  (1.575 m)  Wt 151 lb (68.493 kg)  BMI 27.61 kg/m2      Assessment & Plan:  1. Controlled type 2 diabetes mellitus with complication, without long-term current use of insulin (HCC) -The hemoglobin A1c is under good control the patient will continue with current treatment  2. Essential hypertension -The blood pressures under good control the patient will continue with current treatment  3. Chronic pruritic rash in adult -She will increase her gabapentin to every  8 hours instead of every 12 hours as directed by the dermatologist. She will continue with current creams and follow-up with her March 1  Patient Instructions                       Medicare Annual Wellness Visit  [facility] and the medical providers at [facility] strive to bring you the best medical care.  In doing so we not only want to address your current medical conditions and concerns but also to detect new conditions early and prevent illness, disease and health-related problems.    Medicare offers a yearly Wellness Visit which allows our clinical staff to assess your need for preventative services including immunizations, lifestyle education, counseling to decrease risk of preventable diseases and screening for fall risk and other medical concerns.    This visit is provided free of charge (no copay) for all Medicare recipients. The clinical pharmacists at [facility] have begun to conduct these Wellness Visits which will also include a  thorough review of all your medications.    As you primary medical provider recommend that you make an appointment for your Annual Wellness Visit if you have not done so already this year.  You may set up this appointment before you leave today or you may call back [phone] and schedule an appointment.  Please make sure when you call that you mention that you are scheduling your Annual Wellness Visit with the clinical pharmacist so that the appointment may be made for the proper length of time.     Continue current medications. Continue good therapeutic lifestyle changes which include good diet and exercise. Fall precautions discussed with patient. If an FOBT was given today- please return it to our front desk. If you are over 25 years old - you may need Prevnar 47 or the adult Pneumonia vaccine.  **Flu shots are available--- please call and schedule a FLU-CLINIC appointment**  After your visit with [place] today you will receive a survey in the mail or online from [employer] regarding your care with [place]. Please take a moment to fill this out. Your feedback is very important to [place] as you can help [place] better understand your patient needs as well as improve your experience and satisfaction. WE CARE ABOUT YOU!!!   Take medications regularly Use inhaler more regularly  Keep appointment with dermatologist Take gabapentin every 8 hours   Arrie Senate MD

## 2015-04-17 ENCOUNTER — Ambulatory Visit: Payer: Medicare Other | Admitting: Family Medicine

## 2015-04-19 ENCOUNTER — Telehealth: Payer: Self-pay | Admitting: Family Medicine

## 2015-04-19 NOTE — Telephone Encounter (Signed)
Patient advised to check with [facility] or the other local pharmacies and when she picks a pharmacy we will transfer them for her.

## 2015-04-23 ENCOUNTER — Other Ambulatory Visit: Payer: Self-pay

## 2015-04-23 MED ORDER — AMLODIPINE BESYLATE 2.5 MG PO TABS: 2.5000 mg | ORAL_TABLET | Freq: Every day | ORAL | Status: DC

## 2015-04-23 MED ORDER — FLUTICASONE PROPIONATE 50 MCG/ACT NA SUSP
1.0000 | Freq: Every day | NASAL | Status: DC
Start: 2015-04-23 — End: 2016-02-04

## 2015-04-24 ENCOUNTER — Other Ambulatory Visit: Payer: Self-pay

## 2015-04-24 MED ORDER — METFORMIN HCL ER 500 MG PO TB24: 500.0000 mg | ORAL_TABLET | Freq: Every day | ORAL | Status: DC

## 2015-04-29 ENCOUNTER — Other Ambulatory Visit: Payer: Medicare Other

## 2015-04-29 DIAGNOSIS — Z1212 Encounter for screening for malignant neoplasm of rectum: Secondary | ICD-10-CM

## 2015-04-30 LAB — FECAL OCCULT BLOOD, IMMUNOCHEMICAL: FECAL OCCULT BLD: NEGATIVE

## 2015-05-01 ENCOUNTER — Other Ambulatory Visit (INDEPENDENT_AMBULATORY_CARE_PROVIDER_SITE_OTHER): Payer: Medicare Other

## 2015-05-01 DIAGNOSIS — R300 Dysuria: Secondary | ICD-10-CM | POA: Diagnosis not present

## 2015-05-01 LAB — POCT UA - MICROSCOPIC ONLY
CASTS, UR, LPF, POC: NEGATIVE
Crystals, Ur, HPF, POC: NEGATIVE
MUCUS UA: NEGATIVE
Yeast, UA: NEGATIVE

## 2015-05-01 LAB — POCT URINALYSIS DIPSTICK
Bilirubin, UA: NEGATIVE
Blood, UA: NEGATIVE
Glucose, UA: NEGATIVE
KETONES UA: NEGATIVE
Nitrite, UA: NEGATIVE
PROTEIN UA: NEGATIVE
Spec Grav, UA: 1.015
Urobilinogen, UA: NEGATIVE
pH, UA: 5

## 2015-05-05 LAB — URINE CULTURE

## 2015-05-06 ENCOUNTER — Telehealth: Payer: Self-pay | Admitting: Family Medicine

## 2015-05-06 ENCOUNTER — Other Ambulatory Visit: Payer: Self-pay | Admitting: *Deleted

## 2015-05-06 DIAGNOSIS — N390 Urinary tract infection, site not specified: Secondary | ICD-10-CM

## 2015-05-06 MED ORDER — LORAZEPAM 0.5 MG PO TABS: 0.5000 mg | ORAL_TABLET | Freq: Two times a day (BID) | ORAL | Status: DC | PRN

## 2015-05-06 NOTE — Telephone Encounter (Signed)
What would you recommend?

## 2015-05-06 NOTE — Telephone Encounter (Signed)
Insurance is probably going to stop paying for any of the benzodiazepines. You could try lorazepam 0.5 twice daily if needed #60 with refills. But please forewarn her that it may not pay for this either.

## 2015-05-06 NOTE — Telephone Encounter (Signed)
Med changed and sent to [facility] - could not reach pt by phone!

## 2015-05-10 ENCOUNTER — Telehealth: Payer: Self-pay | Admitting: Family Medicine

## 2015-05-10 NOTE — Telephone Encounter (Signed)
Stp and advised the referral has been placed, once the appt is made she will get a phone call with all of the details. Pt voiced understanding.

## 2015-05-13 ENCOUNTER — Telehealth: Payer: Self-pay | Admitting: Family Medicine

## 2015-05-13 MED ORDER — BUDESONIDE-FORMOTEROL FUMARATE 160-4.5 MCG/ACT IN AERO: 2.0000 | INHALATION_SPRAY | Freq: Two times a day (BID) | RESPIRATORY_TRACT | Status: DC

## 2015-05-13 NOTE — Telephone Encounter (Signed)
Patient aware samples are up front 

## 2015-05-16 ENCOUNTER — Telehealth: Payer: Self-pay | Admitting: Family Medicine

## 2015-05-16 DIAGNOSIS — L299 Pruritus, unspecified: Secondary | ICD-10-CM | POA: Diagnosis not present

## 2015-05-16 DIAGNOSIS — L281 Prurigo nodularis: Secondary | ICD-10-CM | POA: Diagnosis not present

## 2015-05-16 DIAGNOSIS — L503 Dermatographic urticaria: Secondary | ICD-10-CM | POA: Insufficient documentation

## 2015-05-16 DIAGNOSIS — L509 Urticaria, unspecified: Secondary | ICD-10-CM | POA: Diagnosis not present

## 2015-05-16 NOTE — Telephone Encounter (Signed)
Pt called - derm stopped fish oil and gabapentin. - noted

## 2015-05-16 NOTE — Telephone Encounter (Signed)
Stp and she only wants to speak with Georgina Pillion.

## 2015-05-20 ENCOUNTER — Encounter: Payer: Self-pay | Admitting: Family Medicine

## 2015-05-20 ENCOUNTER — Ambulatory Visit (INDEPENDENT_AMBULATORY_CARE_PROVIDER_SITE_OTHER): Payer: Medicare Other | Admitting: Family Medicine

## 2015-05-20 VITALS — BP 114/64 | HR 68 | Temp 97.2°F | Ht 62.0 in | Wt 152.4 lb

## 2015-05-20 DIAGNOSIS — J209 Acute bronchitis, unspecified: Secondary | ICD-10-CM

## 2015-05-20 MED ORDER — CEFDINIR 300 MG PO CAPS: 300.0000 mg | ORAL_CAPSULE | Freq: Two times a day (BID) | ORAL | Status: DC

## 2015-05-20 NOTE — Progress Notes (Signed)
   HPI  Patient presents today  fo cough and cold, urine concerns,  And discussion abot er rah.  Rash Patient has seen a dermatologist fora persistet rash.  She' been reommended to start Claritn, sop amlodipine ad asprin. She oudlike to make ure amloddipine and aaspirinare safeto stop.  no history of cVA or hheart attack.  Cough and cold.se describs 5 daysof cogh and old hat are persistent. She has milddyspnea.  she does have malaisee. No fevers.   Urinary concerns. She complains of frequennt  Accidental urination.  she denies any dysuria, abdominal pain, or new back pain. No fevers  previously treated with  Cephalexin, her last micro-grew Proteuswith several resistances.  PMH: Smoking status noted ROS: Per HPI  Objective: BP 114/64 mmHg  Pulse 68  Temp(Src) 97.2 F (36.2 C) (Oral)  Ht 5\' 2"  (1.575 m)  Wt 152 lb 6.4 oz (69.128 kg)  BMI 27.87 kg/m2 Gen: NAD, alert, cooperative with exam HEENT: NCAT, TMs normal bilaterally, oropharynx clear CV: RRR, good S1/S2, no murmur Resp: CTABL, no wheezes, non-labored Abd: SNTND, BS present, no guarding or organomegaly,  No CVA tenderness Ext: No edema, warm Neuro: Alert and oriented, No gross deficits  Assessment and plan:  # Acute Bronchitis Considering 5 days of worsneing symptoms and age will cover for CAP Omnicef, broad enough to cover last urine Micro as well   # Urinary incontinence Has urology appt scheduled, she would like tpo go sooner.  Recommended calling Unlikely UTI but with recent worsening wil make sure antibiotics for cough are broad enough to cover  # Rash Nonspecific Derm recommending claritin- ok with that DC asa (for 3 months), dc amplodipine and RTC in 1 month for re-check BP    Meds ordered this encounter  Medications  . cefdinir (OMNICEF) 300 MG capsule    Sig: Take 1 capsule (300 mg total) by mouth 2 (two) times daily. 1 po BID    Dispense:  20 capsule    Refill:  0    Laroy Apple,  MD [facility] 05/20/2015, 10:38 AM

## 2015-05-20 NOTE — Patient Instructions (Signed)
Great to see you!  I have prescribed Omnicef, this will help your lung infection and be sure to clear out the bacteria in your bladder as well.  Call [facility] for an appointment sooner. [phone]  It is ok to stop amlodipine and aspirin to see if they are ,making your rash worse Restart aspirin in 3 months if the rash is not better.   Come back in 1 month to look at your blood pressure again.

## 2015-05-22 ENCOUNTER — Other Ambulatory Visit: Payer: Self-pay | Admitting: Family Medicine

## 2015-05-22 NOTE — Telephone Encounter (Signed)
Pt to try mucinex plain blue and white. With full glass of water.pt aware

## 2015-05-22 NOTE — Telephone Encounter (Signed)
Please find out from patient what kind of cough medicine she has received in the past that helped her cough and refill this 1

## 2015-05-22 NOTE — Telephone Encounter (Signed)
Refill refill cough medicines after talking with patient

## 2015-05-22 NOTE — Telephone Encounter (Signed)
Patient was not given cough medication she is requesting a prescription. Please advise and route to pool a please.

## 2015-05-29 ENCOUNTER — Ambulatory Visit (INDEPENDENT_AMBULATORY_CARE_PROVIDER_SITE_OTHER): Payer: Medicare Other

## 2015-05-29 ENCOUNTER — Encounter: Payer: Self-pay | Admitting: Family Medicine

## 2015-05-29 ENCOUNTER — Ambulatory Visit (INDEPENDENT_AMBULATORY_CARE_PROVIDER_SITE_OTHER): Payer: Medicare Other | Admitting: Family Medicine

## 2015-05-29 VITALS — BP 114/71 | HR 73 | Temp 96.8°F | Ht 62.0 in | Wt 152.0 lb

## 2015-05-29 DIAGNOSIS — R059 Cough, unspecified: Secondary | ICD-10-CM

## 2015-05-29 DIAGNOSIS — F419 Anxiety disorder, unspecified: Secondary | ICD-10-CM | POA: Diagnosis not present

## 2015-05-29 DIAGNOSIS — R05 Cough: Secondary | ICD-10-CM | POA: Diagnosis not present

## 2015-05-29 DIAGNOSIS — E1122 Type 2 diabetes mellitus with diabetic chronic kidney disease: Secondary | ICD-10-CM | POA: Diagnosis not present

## 2015-05-29 DIAGNOSIS — N393 Stress incontinence (female) (male): Secondary | ICD-10-CM

## 2015-05-29 DIAGNOSIS — J449 Chronic obstructive pulmonary disease, unspecified: Secondary | ICD-10-CM | POA: Insufficient documentation

## 2015-05-29 DIAGNOSIS — J988 Other specified respiratory disorders: Secondary | ICD-10-CM | POA: Diagnosis not present

## 2015-05-29 DIAGNOSIS — J439 Emphysema, unspecified: Secondary | ICD-10-CM | POA: Diagnosis not present

## 2015-05-29 DIAGNOSIS — J209 Acute bronchitis, unspecified: Secondary | ICD-10-CM

## 2015-05-29 DIAGNOSIS — R110 Nausea: Secondary | ICD-10-CM

## 2015-05-29 DIAGNOSIS — N182 Chronic kidney disease, stage 2 (mild): Secondary | ICD-10-CM | POA: Diagnosis not present

## 2015-05-29 LAB — POCT INFLUENZA A/B
INFLUENZA A, POC: NEGATIVE
INFLUENZA B, POC: NEGATIVE

## 2015-05-29 MED ORDER — PREDNISONE 10 MG PO TABS: ORAL_TABLET | ORAL | Status: DC

## 2015-05-29 MED ORDER — BUDESONIDE 0.5 MG/2ML IN SUSP
0.5000 mg | RESPIRATORY_TRACT | Status: AC
  Administered 2015-05-29: 0.5 mg via RESPIRATORY_TRACT

## 2015-05-29 MED ORDER — ALPRAZOLAM 0.25 MG PO TABS: ORAL_TABLET | ORAL | Status: DC

## 2015-05-29 MED ORDER — METHYLPREDNISOLONE ACETATE 80 MG/ML IJ SUSP
60.0000 mg | Freq: Once | INTRAMUSCULAR | Status: AC
  Administered 2015-05-29: 60 mg via INTRAMUSCULAR

## 2015-05-29 MED ORDER — ALBUTEROL SULFATE (2.5 MG/3ML) 0.083% IN NEBU: 2.5000 mg | INHALATION_SOLUTION | Freq: Four times a day (QID) | RESPIRATORY_TRACT | Status: DC | PRN

## 2015-05-29 MED ORDER — ALBUTEROL SULFATE (2.5 MG/3ML) 0.083% IN NEBU
2.5000 mg | INHALATION_SOLUTION | RESPIRATORY_TRACT | Status: AC
  Administered 2015-05-29: 2.5 mg via RESPIRATORY_TRACT

## 2015-05-29 MED ORDER — METFORMIN HCL ER 500 MG PO TB24: 500.0000 mg | ORAL_TABLET | Freq: Every day | ORAL | Status: DC

## 2015-05-29 MED ORDER — AMLODIPINE BESYLATE 2.5 MG PO TABS: 2.5000 mg | ORAL_TABLET | Freq: Every day | ORAL | Status: DC

## 2015-05-29 NOTE — Patient Instructions (Addendum)
The patient should continue to drink plenty of fluids and stay well hydrated She should use a cool mist humidifier at home She should use the Symbicort 2 puffs twice a day and rinse the mouth out after using She should take Mucinex maximum strength, blue and white in color, 1 twice daily with a large glass of water for cough and congestion She should use her nebulizer treatment every 6-8 hours to help with the tightness and bronchospasm She should use nasal saline frequently in each nostril during the day and nasal saline gel at bedtime She should change the way she is taking her Xanax and take one half tablet with each meal and at bedtime She should take the prednisone as directed She should return to the clinic in one week for recheck and sooner if worse Take one half of a Claritin twice daily Do nebulizer treatments at home every 6-8 hours with albuterol

## 2015-05-29 NOTE — Progress Notes (Signed)
Subjective:    Patient ID: [name] [name], female    DOB: 1931-10-12, 82 y.o.   MRN: [id number]  HPI Patient here today for continued cough, congestion, and now nausea. This is been going on for about 10 days. She is still coughing up yellow sputum. She has not been to see the urologist yet. That appointment sometime over in March. She is not using her Symbicort regularly. The chest x-ray done today shows emphysema COPD. The nausea is not a big issue.     Patient Active Problem List   Diagnosis Date Noted  . Type 2 diabetes mellitus, controlled ([place]) 08/30/2014  . Rectal bleeding 03/07/2014  . BRBPR (bright red blood per rectum) 03/07/2014  . History of total left hip arthroplasty 01/25/2014  . Elevated liver enzymes 01/13/2014  . Edema 01/02/2014  . Hypoxia 01/02/2014  . S/P total hip arthroplasty 12/27/2013  . Chronic cough 10/26/2013  . Hyperlipidemia   . Anxiety   . Depressive disorder, not elsewhere classified   . Rhinitis   . Elevated blood sugar   . Atrophic vaginitis   . Postmenopausal    Outpatient Encounter Prescriptions as of 05/29/2015  Medication Sig  . amLODipine (NORVASC) 2.5 MG tablet Take 1 tablet (2.5 mg total) by mouth daily.  Marland Kitchen aspirin 81 MG tablet Take 81 mg by mouth daily.  . budesonide-formoterol (SYMBICORT) 160-4.5 MCG/ACT inhaler Inhale 2 puffs into the lungs 2 (two) times daily.  . calcium carbonate (OS-CAL) 600 MG TABS tablet Take 600 mg by mouth 2 (two) times daily with a meal.  . cefdinir (OMNICEF) 300 MG capsule Take 1 capsule (300 mg total) by mouth 2 (two) times daily. 1 po BID  . cholecalciferol (VITAMIN D) 1000 UNITS tablet Take 2,000 Units by mouth daily.  Marland Kitchen escitalopram (LEXAPRO) 10 MG tablet TAKE ONE TABLET BY MOUTH EVERY DAY AS DIRECTED  . fluticasone (FLONASE) 50 MCG/ACT nasal spray Place 1 spray into both nostrils daily.  Marland Kitchen glucose blood test strip Use to check BG once a day to every other day.  Dx:  E11.9  . LORazepam (ATIVAN) 0.5 MG  tablet Take 1 tablet (0.5 mg total) by mouth 2 (two) times daily as needed for anxiety.  . lovastatin (MEVACOR) 40 MG tablet TAKE ONE TABLET BY MOUTH every other day.  . metFORMIN (GLUCOPHAGE-XR) 500 MG 24 hr tablet Take 1 tablet (500 mg total) by mouth daily with breakfast.  . mupirocin ointment (BACTROBAN) 2 % APPLY TO AFFECTED AREAS 3 TIMES DAILY FOR 10 DAYS  . ONETOUCH DELICA LANCETS 22W MISC Use to check BG once a day to every other day Dx: E 11.9   No facility-administered encounter medications on file as of 05/29/2015.     Review of Systems  Constitutional: Negative.  Negative for fever.  HENT: Positive for congestion.   Eyes: Negative.   Respiratory: Positive for cough.   Cardiovascular: Negative.   Gastrointestinal: Positive for nausea.  Endocrine: Negative.   Genitourinary: Negative.   Musculoskeletal: Negative.   Skin: Negative.   Allergic/Immunologic: Negative.   Neurological: Negative.   Hematological: Negative.   Psychiatric/Behavioral: Negative.        Objective:   Physical Exam  Constitutional: She is oriented to person, place, and time. She appears distressed.  Small frail alert and not feeling well  HENT:  Head: Normocephalic and atraumatic.  Right Ear: External ear normal.  Left Ear: External ear normal.  Mouth/Throat: Oropharynx is clear and moist.  Nasal congestion bilaterally  Eyes: Conjunctivae and EOM are normal. Pupils are equal, round, and reactive to light. Right eye exhibits no discharge. Left eye exhibits no discharge. No scleral icterus.  Neck: Normal range of motion. Neck supple. No thyromegaly present.  Without bruits or thyromegaly or adenopathy  Cardiovascular: Normal rate, regular rhythm and normal heart sounds.   No murmur heard. Pulmonary/Chest: Effort normal. No respiratory distress. She has wheezes. She has no rales. She exhibits no tenderness.  There are wheezes and rhonchi bilaterally. The patient cannot take a deep breath without  coughing. The congestion is tight. The chest x-ray has been reported and shows no active disease only COPD.  Abdominal: Soft. Bowel sounds are normal. She exhibits no mass. There is no tenderness. There is no rebound and no guarding.  Musculoskeletal: Normal range of motion. She exhibits no edema.  Somewhat hesitant with her gait and range of motion activity because of weakness  Lymphadenopathy:    She has no cervical adenopathy.  Neurological: She is alert and oriented to person, place, and time. No cranial nerve deficit.  Skin: Skin is warm and dry. Rash noted.  The patient has her continued neurodermatitis.  Psychiatric: She has a normal mood and affect. Her behavior is normal. Judgment and thought content normal.  Increased anxiety  Nursing note and vitals reviewed.   BP 114/71 mmHg  Pulse 73  Temp(Src) 96.8 F (36 C) (Oral)  Ht 5' 2" (1.575 m)  Wt 152 lb (68.947 kg)  BMI 27.79 kg/m2 [facility] reading (PRIMARY) by  Dr.Moore-chest x-ray-emphysema no active disease                                 After the nebulizer treatment with albuterol, the breath sounds were improved even though there was still wheezing and congestion.      Assessment & Plan:  1. Cough -Take Mucinex regularly 1 twice daily with a large glass of water - CBC with Differential/Platelet - BMP8+EGFR - DG Chest 2 View; Future - POCT Influenza A/B - budesonide (PULMICORT) nebulizer solution 0.5 mg; Take 2 mLs (0.5 mg total) by nebulization stat. - albuterol (PROVENTIL) (2.5 MG/3ML) 0.083% nebulizer solution 2.5 mg; Take 3 mLs (2.5 mg total) by nebulization stat.  2. Congestion of respiratory tract -Drink plenty of fluids and take Mucinex and use Symbicort and home nebulizer treatment with albuterol - CBC with Differential/Platelet - BMP8+EGFR - DG Chest 2 View; Future - POCT Influenza A/B  3. Nausea without vomiting -Continue to drink plenty of fluids - CBC with Differential/Platelet - BMP8+EGFR - POCT  Influenza A/B  4. Pulmonary emphysema, unspecified emphysema type (HCC) - budesonide (PULMICORT) nebulizer solution 0.5 mg; Take 2 mLs (0.5 mg total) by nebulization stat. - albuterol (PROVENTIL) (2.5 MG/3ML) 0.083% nebulizer solution 2.5 mg; Take 3 mLs (2.5 mg total) by nebulization stat.  5. Controlled type 2 diabetes mellitus with stage 2 chronic kidney disease, without long-term current use of insulin ([place])  6. Anxiety -Change Xanax to one half tablet with each meal and at bedtime  7. Bronchospasm with bronchitis, acute -Use nebulizer treatments as directed every 6-8 hours with albuterol and continue with Symbicort - predniSONE (DELTASONE) 10 MG tablet; 1 tablet 4 times a day for 2 days,  1 tablet 3 times a day for 2 days,  1 tablet 2 times a day for 2 days, 1 tablet daily for 2 days  Dispense: 20 tablet; Refill: 0 - methylPREDNISolone acetate (  DEPO-MEDROL) injection 60 mg; Inject 0.75 mLs (60 mg total) into the muscle once. - budesonide (PULMICORT) nebulizer solution 0.5 mg; Take 2 mLs (0.5 mg total) by nebulization stat. - albuterol (PROVENTIL) (2.5 MG/3ML) 0.083% nebulizer solution 2.5 mg; Take 3 mLs (2.5 mg total) by nebulization stat.  8. Stress incontinence -Follow up with urology as planned  Patient Instructions  The patient should continue to drink plenty of fluids and stay well hydrated She should use a cool mist humidifier at home She should use the Symbicort 2 puffs twice a day and rinse the mouth out after using She should take Mucinex maximum strength, blue and white in color, 1 twice daily with a large glass of water for cough and congestion She should use her nebulizer treatment every 6-8 hours to help with the tightness and bronchospasm She should use nasal saline frequently in each nostril during the day and nasal saline gel at bedtime She should change the way she is taking her Xanax and take one half tablet with each meal and at bedtime She should take the  prednisone as directed She should return to the clinic in one week for recheck and sooner if worse Take one half of a Claritin twice daily Do nebulizer treatments at home every 6-8 hours with albuterol   Arrie Senate MD

## 2015-05-30 LAB — BMP8+EGFR
BUN / CREAT RATIO: 10 — AB (ref 11–26)
BUN: 8 mg/dL (ref 8–27)
CALCIUM: 9.9 mg/dL (ref 8.7–10.3)
CO2: 23 mmol/L (ref 18–29)
Chloride: 96 mmol/L (ref 96–106)
Creatinine, Ser: 0.83 mg/dL (ref 0.57–1.00)
GFR calc Af Amer: 76 mL/min/{1.73_m2} (ref >59)
GFR, EST NON AFRICAN AMERICAN: 66 mL/min/{1.73_m2} (ref >59)
Glucose: 105 mg/dL — ABNORMAL HIGH (ref 65–99)
POTASSIUM: 5 mmol/L (ref 3.5–5.2)
SODIUM: 135 mmol/L (ref 134–144)

## 2015-05-30 LAB — CBC WITH DIFFERENTIAL/PLATELET
BASOS: 0 %
Basophils Absolute: 0 10*3/uL (ref 0.0–0.2)
EOS (ABSOLUTE): 0 10*3/uL (ref 0.0–0.4)
Eos: 1 %
Hematocrit: 44.2 % (ref 34.0–46.6)
Hemoglobin: 15.1 g/dL (ref 11.1–15.9)
Immature Grans (Abs): 0 10*3/uL (ref 0.0–0.1)
Immature Granulocytes: 0 %
LYMPHS: 16 %
Lymphocytes Absolute: 1.3 10*3/uL (ref 0.7–3.1)
MCH: 30.5 pg (ref 26.6–33.0)
MCHC: 34.2 g/dL (ref 31.5–35.7)
MCV: 89 fL (ref 79–97)
MONOCYTES: 7 %
Monocytes Absolute: 0.6 10*3/uL (ref 0.1–0.9)
NEUTROS ABS: 6.2 10*3/uL (ref 1.4–7.0)
NEUTROS PCT: 76 %
Platelets: 324 10*3/uL (ref 150–379)
RBC: 4.95 x10E6/uL (ref 3.77–5.28)
RDW: 13.4 % (ref 12.3–15.4)
WBC: 8.2 10*3/uL (ref 3.4–10.8)

## 2015-06-06 ENCOUNTER — Encounter: Payer: Self-pay | Admitting: Family Medicine

## 2015-06-06 ENCOUNTER — Ambulatory Visit (INDEPENDENT_AMBULATORY_CARE_PROVIDER_SITE_OTHER): Payer: Medicare Other | Admitting: Family Medicine

## 2015-06-06 VITALS — BP 126/68 | HR 68 | Temp 97.2°F | Ht 62.0 in | Wt 153.0 lb

## 2015-06-06 DIAGNOSIS — Z96642 Presence of left artificial hip joint: Secondary | ICD-10-CM | POA: Diagnosis not present

## 2015-06-06 DIAGNOSIS — E1122 Type 2 diabetes mellitus with diabetic chronic kidney disease: Secondary | ICD-10-CM

## 2015-06-06 DIAGNOSIS — N183 Type 2 diabetes mellitus with diabetic chronic kidney disease: Secondary | ICD-10-CM

## 2015-06-06 DIAGNOSIS — J42 Unspecified chronic bronchitis: Secondary | ICD-10-CM | POA: Diagnosis not present

## 2015-06-06 DIAGNOSIS — L309 Dermatitis, unspecified: Secondary | ICD-10-CM

## 2015-06-06 DIAGNOSIS — F419 Anxiety disorder, unspecified: Secondary | ICD-10-CM | POA: Diagnosis not present

## 2015-06-06 NOTE — Progress Notes (Signed)
Subjective:    Patient ID: [name] [name], female    DOB: [birth year], 82 y.o.   MRN: [id number]  HPI Patient is here today for a recheck on bronchitis, and an update on dermatology. The patient is doing better with her itching and skin problems after seeing the specialist at [facility]. She does not think that the Claritin is helping that much and will instead try some Allegra over-the-counter. She is using her nebulizer treatments 3 times daily and only do in the Symbicort 1 puff twice daily and she understands it is more effective if she does 2 puffs twice daily. She is still taking her Mucinex. She is improving but still has cough and congestion. The sputum is clear.   Review of Systems  Constitutional: Negative.   HENT: Positive for congestion.   Eyes: Negative.   Respiratory: Positive for cough.   Cardiovascular: Negative.   Gastrointestinal: Negative.   Endocrine: Negative.   Genitourinary: Negative.   Musculoskeletal: Negative.   Skin:       Itching   Allergic/Immunologic: Negative.   Neurological: Negative.   Hematological: Negative.   Psychiatric/Behavioral: Negative.         Patient Active Problem List   Diagnosis Date Noted  . COPD (chronic obstructive pulmonary disease) ([place]) 05/29/2015  . Type 2 diabetes mellitus, controlled ([employer]) 08/30/2014  . Rectal bleeding 03/07/2014  . BRBPR (bright red blood per rectum) 03/07/2014  . History of total left hip arthroplasty 01/25/2014  . Elevated liver enzymes 01/13/2014  . Edema 01/02/2014  . Hypoxia 01/02/2014  . S/P total hip arthroplasty 12/27/2013  . Chronic cough 10/26/2013  . Hyperlipidemia   . Anxiety   . Depressive disorder, not elsewhere classified   . Rhinitis   . Elevated blood sugar   . Atrophic vaginitis   . Postmenopausal    Outpatient Encounter Prescriptions as of 06/06/2015  Medication Sig  . albuterol (PROVENTIL) (2.5 MG/3ML) 0.083% nebulizer solution Take 3 mLs (2.5 mg  total) by nebulization every 6 (six) hours as needed for wheezing or shortness of breath.  . ALPRAZolam (XANAX) 0.25 MG tablet Take 1/2 tab QID as needed  . amLODipine (NORVASC) 2.5 MG tablet Take 1 tablet (2.5 mg total) by mouth daily.  Marland Kitchen aspirin 81 MG tablet Take 81 mg by mouth daily.  . budesonide-formoterol (SYMBICORT) 160-4.5 MCG/ACT inhaler Inhale 2 puffs into the lungs 2 (two) times daily.  . calcium carbonate (OS-CAL) 600 MG TABS tablet Take 600 mg by mouth 2 (two) times daily with a meal.  . cholecalciferol (VITAMIN D) 1000 UNITS tablet Take 2,000 Units by mouth daily.  Marland Kitchen escitalopram (LEXAPRO) 10 MG tablet TAKE ONE TABLET BY MOUTH EVERY DAY AS DIRECTED  . fluticasone (FLONASE) 50 MCG/ACT nasal spray Place 1 spray into both nostrils daily.  Marland Kitchen glucose blood test strip Use to check BG once a day to every other day.  Dx:  E11.9  . LORazepam (ATIVAN) 0.5 MG tablet Take 1 tablet (0.5 mg total) by mouth 2 (two) times daily as needed for anxiety.  . lovastatin (MEVACOR) 40 MG tablet TAKE ONE TABLET BY MOUTH every other day.  . metFORMIN (GLUCOPHAGE-XR) 500 MG 24 hr tablet Take 1 tablet (500 mg total) by mouth daily with breakfast.  . ONETOUCH DELICA LANCETS 99991111 MISC Use to check BG once a day to every other day Dx: E 11.9  . [DISCONTINUED] cefdinir (OMNICEF) 300 MG capsule Take 1 capsule (300 mg total) by mouth 2 (two)  times daily. 1 po BID  . [DISCONTINUED] mupirocin ointment (BACTROBAN) 2 % APPLY TO AFFECTED AREAS 3 TIMES DAILY FOR 10 DAYS (Patient not taking: Reported on 06/06/2015)  . [DISCONTINUED] predniSONE (DELTASONE) 10 MG tablet 1 tablet 4 times a day for 2 days,  1 tablet 3 times a day for 2 days,  1 tablet 2 times a day for 2 days, 1 tablet daily for 2 days   No facility-administered encounter medications on file as of 06/06/2015.       Objective:   Physical Exam  Constitutional: She is oriented to person, place, and time. No distress.  Small framed and pale but alert and  doing some better with her lungs and skin  HENT:  Head: Normocephalic and atraumatic.  Right Ear: External ear normal.  Left Ear: External ear normal.  Nose: Nose normal.  Mouth/Throat: Oropharynx is clear and moist.  Eyes: Conjunctivae and EOM are normal. Pupils are equal, round, and reactive to light. Right eye exhibits no discharge. Left eye exhibits no discharge. No scleral icterus.  Neck: Normal range of motion. Neck supple. No thyromegaly present.  Cardiovascular: Normal rate and regular rhythm.   No murmur heard. Pulmonary/Chest: Effort normal and breath sounds normal. No respiratory distress. She has no wheezes. She has no rales. She exhibits no tenderness.  There is some rhonchi with coughing. She has good breath sounds but continues to have a congested cough with sputum that is clear in color  Abdominal: Soft. Bowel sounds are normal. She exhibits no mass. There is no tenderness. There is no rebound and no guarding.  Musculoskeletal: She exhibits no edema or tenderness.  Hesitant range of motion due to hip replacement  Lymphadenopathy:    She has no cervical adenopathy.  Neurological: She is alert and oriented to person, place, and time.  Skin: Skin is warm and dry. Rash noted. There is erythema.  The chronic dermatitis the patient has appears to be improving somewhat and does not have the red areas of excoriation that she's had in the past and she is encouraged to continue to follow-up with the dermatologist and continue with current treatment.  Psychiatric: She has a normal mood and affect. Her behavior is normal. Judgment and thought content normal.  Nursing note and vitals reviewed.  BP 126/68 mmHg  Pulse 68  Temp(Src) 97.2 F (36.2 C) (Oral)  Ht 5\' 2"  (1.575 m)  Wt 153 lb (69.4 kg)  BMI 27.98 kg/m2        Assessment & Plan:  1. Anxiety -Continue with current treatment  2. Chronic bronchitis, unspecified chronic bronchitis type ([place]) -Continue with albuterol  nebulizer Symbicort Mucinex and lots of fluids  3. History of total left hip arthroplasty -Continue to be careful and did not put herself at risk for falling  4. Controlled type 2 diabetes mellitus with stage 3 chronic kidney disease, without long-term current use of insulin ([place]) -Continue with current blood sugar treatment  5. Dermatitis -Follow-up with dermatology as planned and continue with current topical treatments. She will try Allegra instead of Claritin and she feels like Claritin is not helping that much.  Patient Instructions  Continue with albuterol nebulizer as currently doing 3 times daily Continue with Symbicort, 2 puffs twice daily and rinse mouth after using Continue with Mucinex one twice daily with a large glass of water As far as the skin is concerned try Allegra 12 hour one twice daily or Allegra 24-hour one daily instead of the Claritin and see  if this helps anymore with the itching Continue to drink plenty of fluids and stay well hydrated Increase activity but stay out of crowds of people Follow-up with the urologist as planned Follow-up with the dermatologist as planned in May and for the time being continue current topical treatments If the patient gets worse she will be sent to the pulmonologist.   Arrie Senate MD

## 2015-06-06 NOTE — Patient Instructions (Addendum)
Continue with albuterol nebulizer as currently doing 3 times daily Continue with Symbicort, 2 puffs twice daily and rinse mouth after using Continue with Mucinex one twice daily with a large glass of water As far as the skin is concerned try Allegra 12 hour one twice daily or Allegra 24-hour one daily instead of the Claritin and see if this helps anymore with the itching Continue to drink plenty of fluids and stay well hydrated Increase activity but stay out of crowds of people Follow-up with the urologist as planned Follow-up with the dermatologist as planned in May and for the time being continue current topical treatments If the patient gets worse she will be sent to the pulmonologist.

## 2015-06-21 ENCOUNTER — Other Ambulatory Visit: Payer: Self-pay | Admitting: *Deleted

## 2015-06-21 ENCOUNTER — Telehealth: Payer: Self-pay | Admitting: Family Medicine

## 2015-06-21 MED ORDER — ONETOUCH DELICA LANCETS 33G MISC: Status: DC

## 2015-06-21 MED ORDER — GLUCOSE BLOOD VI STRP: ORAL_STRIP | Status: DC

## 2015-06-21 NOTE — Telephone Encounter (Signed)
Can she decrease on her breathing treatments

## 2015-06-21 NOTE — Telephone Encounter (Signed)
Patient aware.

## 2015-06-21 NOTE — Telephone Encounter (Signed)
Continue breathing treatments until the patient is rechecked if possible

## 2015-07-03 DIAGNOSIS — Z87440 Personal history of urinary (tract) infections: Secondary | ICD-10-CM | POA: Diagnosis not present

## 2015-07-04 DIAGNOSIS — L281 Prurigo nodularis: Secondary | ICD-10-CM | POA: Diagnosis not present

## 2015-07-04 DIAGNOSIS — L219 Seborrheic dermatitis, unspecified: Secondary | ICD-10-CM | POA: Diagnosis not present

## 2015-07-05 ENCOUNTER — Encounter: Payer: Self-pay | Admitting: Family Medicine

## 2015-07-05 ENCOUNTER — Ambulatory Visit (INDEPENDENT_AMBULATORY_CARE_PROVIDER_SITE_OTHER): Payer: Medicare Other | Admitting: Family Medicine

## 2015-07-05 VITALS — BP 109/63 | HR 82 | Temp 97.0°F | Ht 62.0 in | Wt 156.0 lb

## 2015-07-05 DIAGNOSIS — E118 Type 2 diabetes mellitus with unspecified complications: Secondary | ICD-10-CM

## 2015-07-05 DIAGNOSIS — E785 Hyperlipidemia, unspecified: Secondary | ICD-10-CM

## 2015-07-05 DIAGNOSIS — J42 Unspecified chronic bronchitis: Secondary | ICD-10-CM | POA: Diagnosis not present

## 2015-07-05 NOTE — Patient Instructions (Signed)
Reduce albuterol nebulizer treatments to twice daily for 2 weeks After 2 weeks reduce it to once daily at bedtime Continue Symbicort 2 puffs twice a day and rinse mouth after using Avoid irritating environments Keep drinking plenty of fluids and stay well hydrated Continue to take Mucinex one twice a day with a large glass of water

## 2015-07-05 NOTE — Progress Notes (Signed)
Subjective:    Patient ID: [name] [name], female    DOB: 12-Aug-1931, 82 y.o.   MRN: [id number]  HPI Patient here today for 6 week follow up on bronchitis. The patient is doing some better with her cough and chest congestion. She still has some sinus congestion and drainage. She just saw the dermatologist this morning and the dermatologist has called me and told me that she had stopped taking her Lexapro. A lot of her itching and scratching seems to be due to her anxiety and nerves and the dermatologist felt that it was most appropriate that we get her to resume her Lexapro on a regular basis. Patient has been doing the nebulizer treatment 3 times daily and would like to reduce the use of this. She is still doing Symbicort 2 puffs twice a day. She is coughing less and feeling better. Through this illness, her blood sugars have been doing well anywhere from 90 to 108. She continues to monitor these at home.     Patient Active Problem List   Diagnosis Date Noted  . COPD (chronic obstructive pulmonary disease) ([place]) 05/29/2015  . Type 2 diabetes mellitus, controlled ([employer]) 08/30/2014  . Rectal bleeding 03/07/2014  . BRBPR (bright red blood per rectum) 03/07/2014  . History of total left hip arthroplasty 01/25/2014  . Elevated liver enzymes 01/13/2014  . Edema 01/02/2014  . Hypoxia 01/02/2014  . S/P total hip arthroplasty 12/27/2013  . Chronic cough 10/26/2013  . Hyperlipidemia   . Anxiety   . Depressive disorder, not elsewhere classified   . Rhinitis   . Elevated blood sugar   . Atrophic vaginitis   . Postmenopausal    Outpatient Encounter Prescriptions as of 07/05/2015  Medication Sig  . albuterol (PROVENTIL) (2.5 MG/3ML) 0.083% nebulizer solution Take 3 mLs (2.5 mg total) by nebulization every 6 (six) hours as needed for wheezing or shortness of breath.  . ALPRAZolam (XANAX) 0.25 MG tablet Take 1/2 tab QID as needed  . amLODipine (NORVASC) 2.5 MG tablet Take 1 tablet (2.5 mg total)  by mouth daily.  Marland Kitchen aspirin 81 MG tablet Take 81 mg by mouth daily.  . budesonide-formoterol (SYMBICORT) 160-4.5 MCG/ACT inhaler Inhale 2 puffs into the lungs 2 (two) times daily.  . calcium carbonate (OS-CAL) 600 MG TABS tablet Take 600 mg by mouth 2 (two) times daily with a meal.  . cholecalciferol (VITAMIN D) 1000 UNITS tablet Take 2,000 Units by mouth daily.  Marland Kitchen escitalopram (LEXAPRO) 10 MG tablet TAKE ONE TABLET BY MOUTH EVERY DAY AS DIRECTED  . fluticasone (FLONASE) 50 MCG/ACT nasal spray Place 1 spray into both nostrils daily.  Marland Kitchen glucose blood test strip Use to check BG once a day to every other day.  Dx:  E11.9  . LORazepam (ATIVAN) 0.5 MG tablet Take 1 tablet (0.5 mg total) by mouth 2 (two) times daily as needed for anxiety.  . lovastatin (MEVACOR) 40 MG tablet TAKE ONE TABLET BY MOUTH every other day.  . metFORMIN (GLUCOPHAGE-XR) 500 MG 24 hr tablet Take 1 tablet (500 mg total) by mouth daily with breakfast.  . ONETOUCH DELICA LANCETS 99991111 MISC Use to check BG once a day to every other day Dx: E 11.9   No facility-administered encounter medications on file as of 07/05/2015.      Review of Systems  Constitutional: Negative.   HENT: Positive for postnasal drip and sinus pressure.   Eyes: Negative.   Cardiovascular: Negative.   Gastrointestinal: Negative.   Endocrine: Negative.  Genitourinary: Negative.   Musculoskeletal: Negative.   Skin: Negative.   Allergic/Immunologic: Negative.   Neurological: Negative.   Hematological: Negative.   Psychiatric/Behavioral: Negative.        Objective:   Physical Exam  Constitutional: She is oriented to person, place, and time. No distress.  HENT:  Head: Normocephalic and atraumatic.  Right Ear: External ear normal.  Left Ear: External ear normal.  Nose: Nose normal.  Mouth/Throat: Oropharynx is clear and moist.  Eyes: Conjunctivae and EOM are normal. Pupils are equal, round, and reactive to light. Right eye exhibits no discharge.  Left eye exhibits no discharge. No scleral icterus.  Neck: Normal range of motion. Neck supple. No thyromegaly present.  Cardiovascular: Normal rate, regular rhythm and normal heart sounds.   No murmur heard. Pulmonary/Chest: Effort normal and breath sounds normal. No respiratory distress. She has no wheezes. She has no rales. She exhibits no tenderness.  The patient's chest is clear today with no wheezes rales or rhonchi.  Musculoskeletal: Normal range of motion. She exhibits no edema.  Lymphadenopathy:    She has no cervical adenopathy.  Neurological: She is alert and oriented to person, place, and time.  Skin: Skin is warm and dry. Rash noted. There is erythema.  The patient continues to have the atopic dermatitis that may be secondary to her anxiety and stress. As mentioned earlier she saw the dermatologist today the dermatologist put her back on Lexapro which she was supposed to been taking all along. She understands the importance of continuing to take this.  Psychiatric: She has a normal mood and affect. Her behavior is normal. Judgment and thought content normal.  Nursing note and vitals reviewed.  BP 109/63 mmHg  Pulse 82  Temp(Src) 97 F (36.1 C) (Oral)  Ht 5\' 2"  (1.575 m)  Wt 156 lb (70.761 kg)  BMI 28.53 kg/m2        Assessment & Plan:  1. Controlled type 2 diabetes mellitus with complication, without long-term current use of insulin ([place]) -The patient should continue to monitor blood sugars closely and stay as active as possible.  2. Hyperlipidemia -Continue with aggressive therapeutic lifestyle changes and once the flu season seems to die down of her she will be only get out and get more active which will be good for her cholesterol as well as her blood sugar.  3. Chronic bronchitis, unspecified chronic bronchitis type ([place]) -She is improving from the bronchitis and the cough. The nebulizer treatments have helped. She continues to do the Symbicort and take her  Mucinex. She currently does not have a rescue inhaler and says she does not want one at this time. She will reduce the the nebulizer treatments with albuterol from 3 times a day to twice a day for 2 weeks and then after 2 weeks reduce it to once a day at bedtime and more often if needed. She will continue to remain out of crowds of people for the next 2-3 weeks until the flu season dies down more.  Patient Instructions  Reduce albuterol nebulizer treatments to twice daily for 2 weeks After 2 weeks reduce it to once daily at bedtime Continue Symbicort 2 puffs twice a day and rinse mouth after using Avoid irritating environments Keep drinking plenty of fluids and stay well hydrated Continue to take Mucinex one twice a day with a large glass of water   Arrie Senate MD

## 2015-07-14 IMAGING — US US EXTREM LOW VENOUS*L*
1 series · 13 of 24 positions shown · non-contrast
Comparison: None.

CLINICAL DATA: Left leg pain and swelling



[Series 1: us extrem low venous*left* · 0.08mm/px · 13 of 38 slices shown]
[im 1/38]
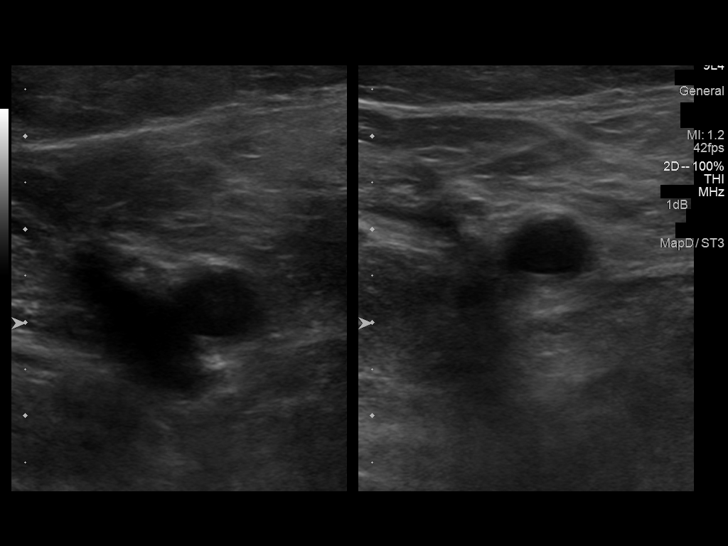
[im 4/38]
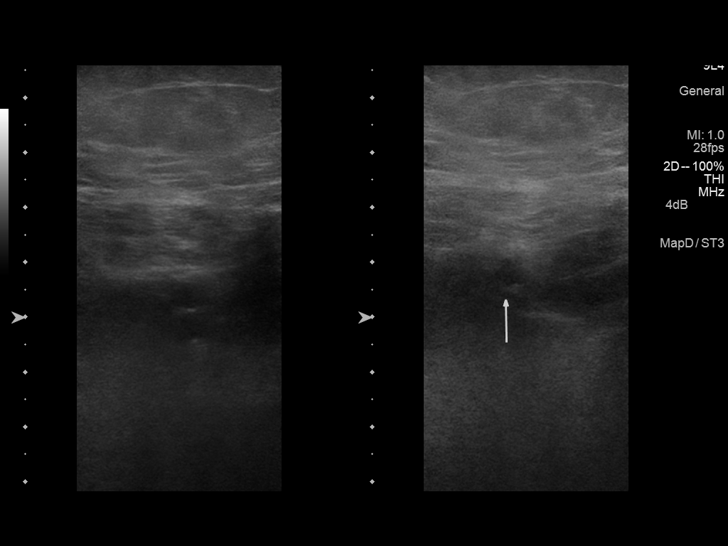
[im 7/38]
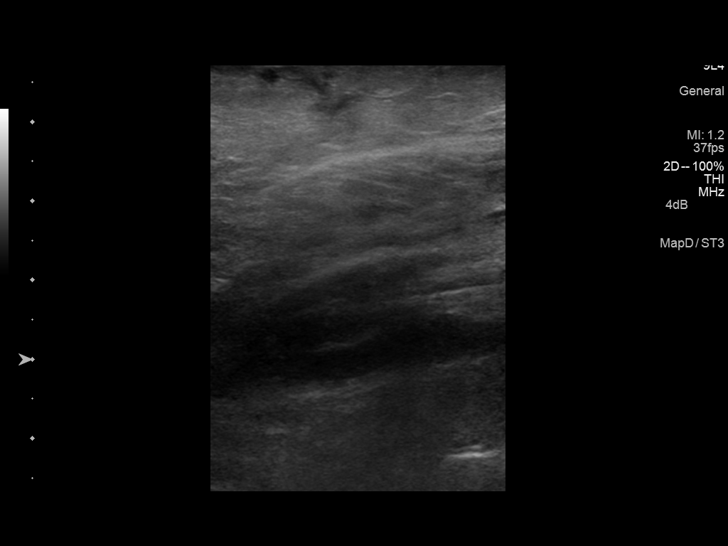
[im 10/38]
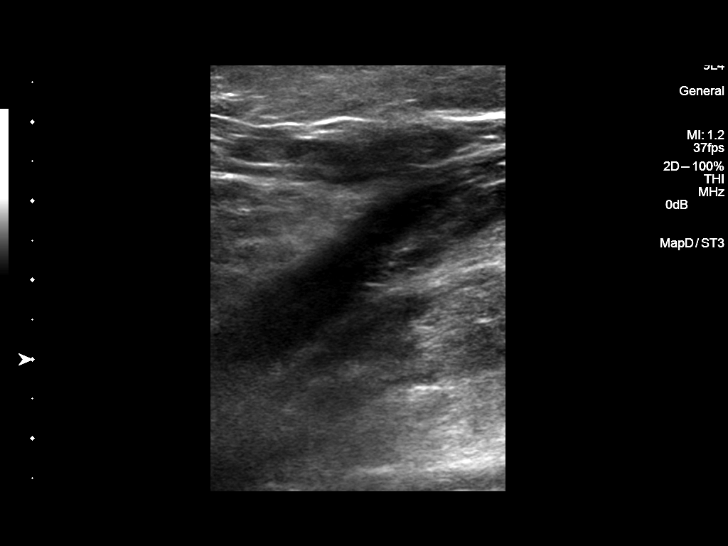
[im 13/38]
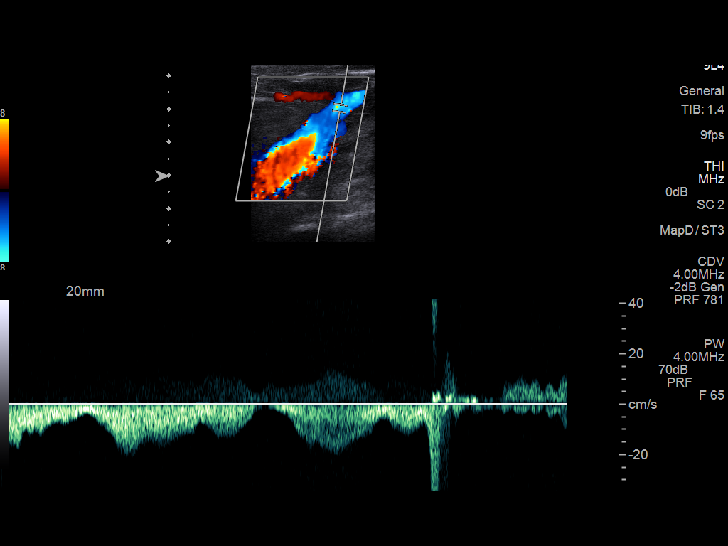
[im 17/38]
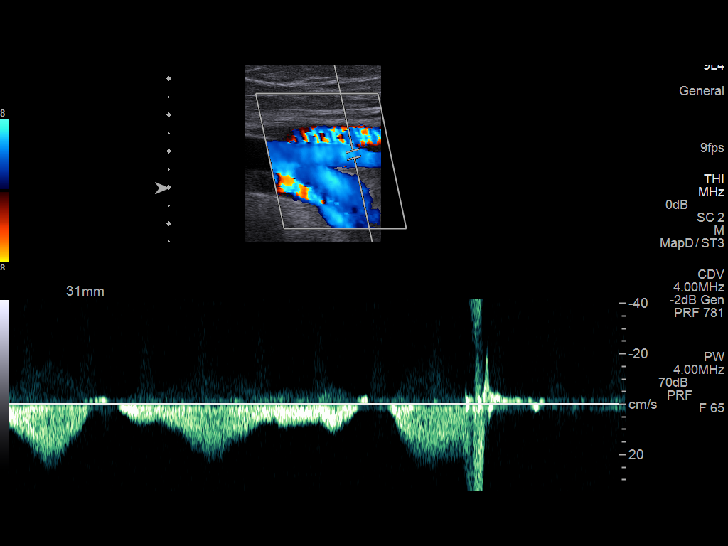
[im 20/38]
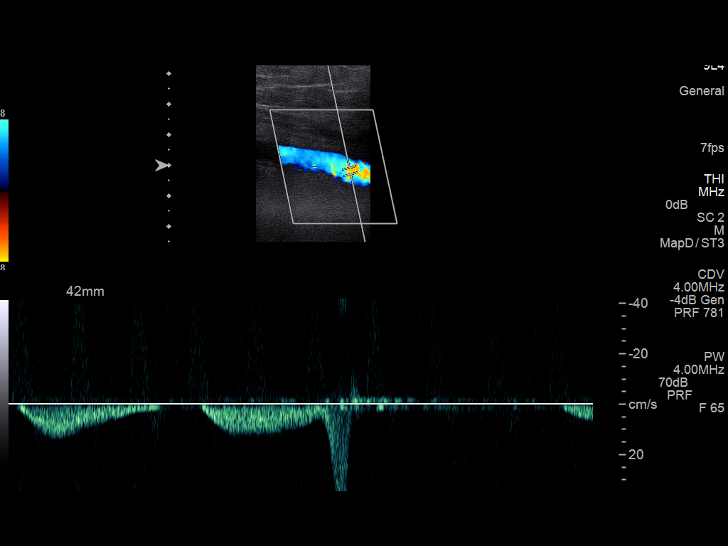
[im 21/38]
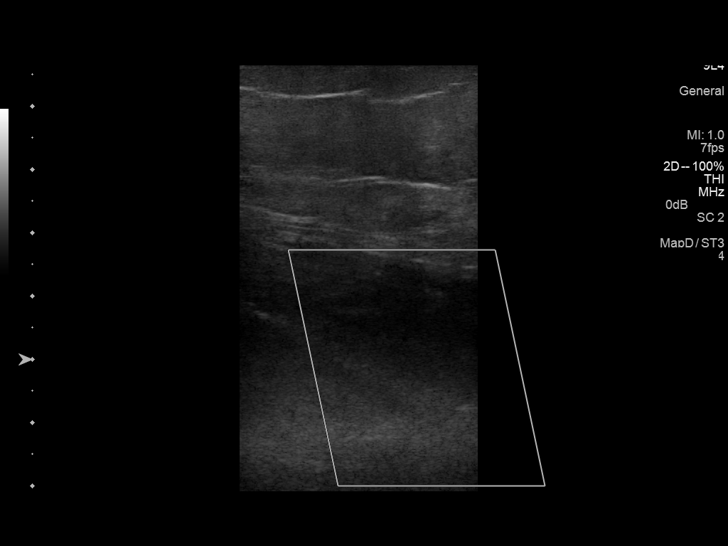
[im 25/38]
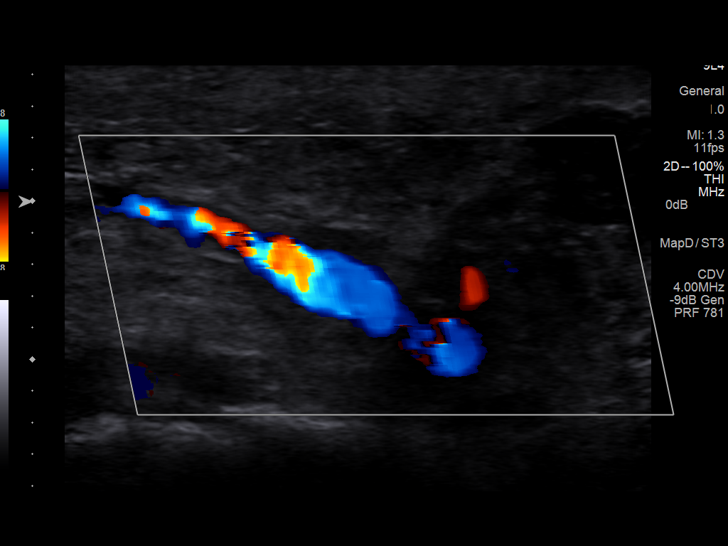
[im 28/38]
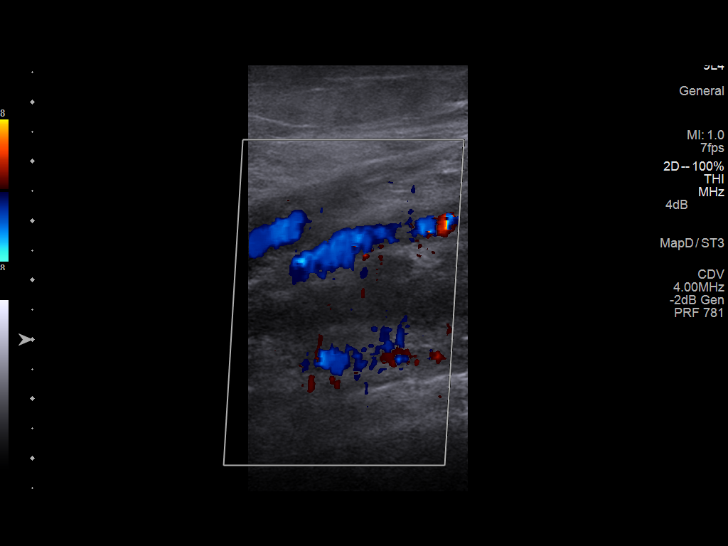
[im 31/38]
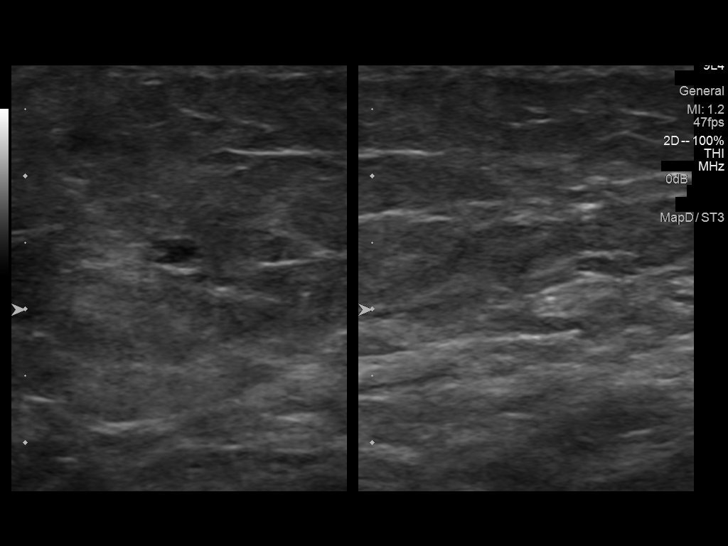
[im 34/38]
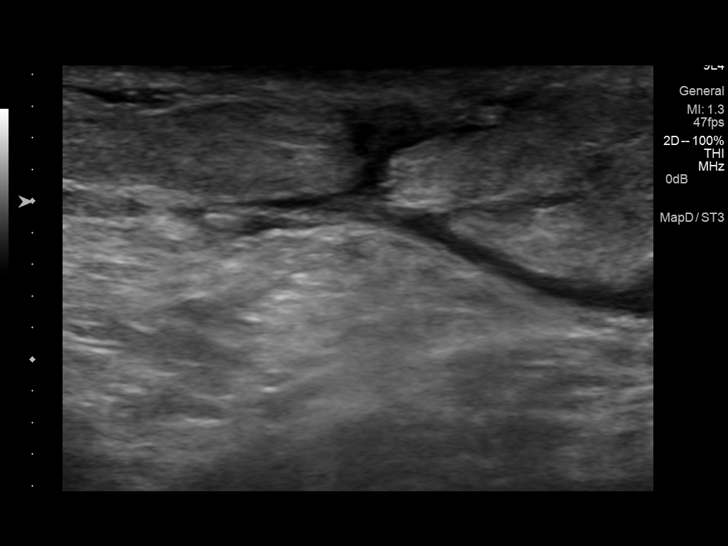
[im 38/38]
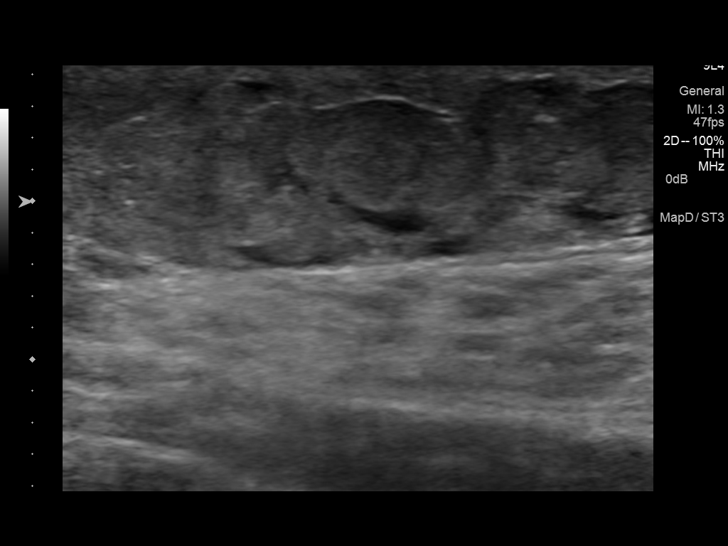

[13 of 24 positions shown; findings below may reference images not displayed]

FINDINGS: Common Femoral Vein: No evidence of thrombus. Normal
compressibility, respiratory phasicity and response to augmentation.

Saphenofemoral Junction: No evidence of thrombus. Normal
compressibility and flow on color Doppler imaging.

Profunda Femoral Vein: No evidence of thrombus. Normal
compressibility and flow on color Doppler imaging.

Femoral Vein: No evidence of thrombus. Normal compressibility,
respiratory phasicity and response to augmentation.

Popliteal Vein: No evidence of thrombus. Normal compressibility,
respiratory phasicity and response to augmentation.

Calf Veins: No evidence of thrombus. Normal compressibility and flow
on color Doppler imaging.

Superficial Great Saphenous Vein: No evidence of thrombus. Normal
compressibility and flow on color Doppler imaging.

Venous Reflux:  None.

Other Findings:  Calf edema is noted.
IMPRESSION: No evidence of deep venous thrombosis.

## 2015-07-16 IMAGING — CR DG CHEST 2V
2 series · 2 of 2 positions shown · non-contrast
Comparison: 09/19/2013.

CLINICAL DATA: Will wheezing and hypoxia.

EXAM:
CHEST  2 VIEW

[view not recorded (1 of 2)]
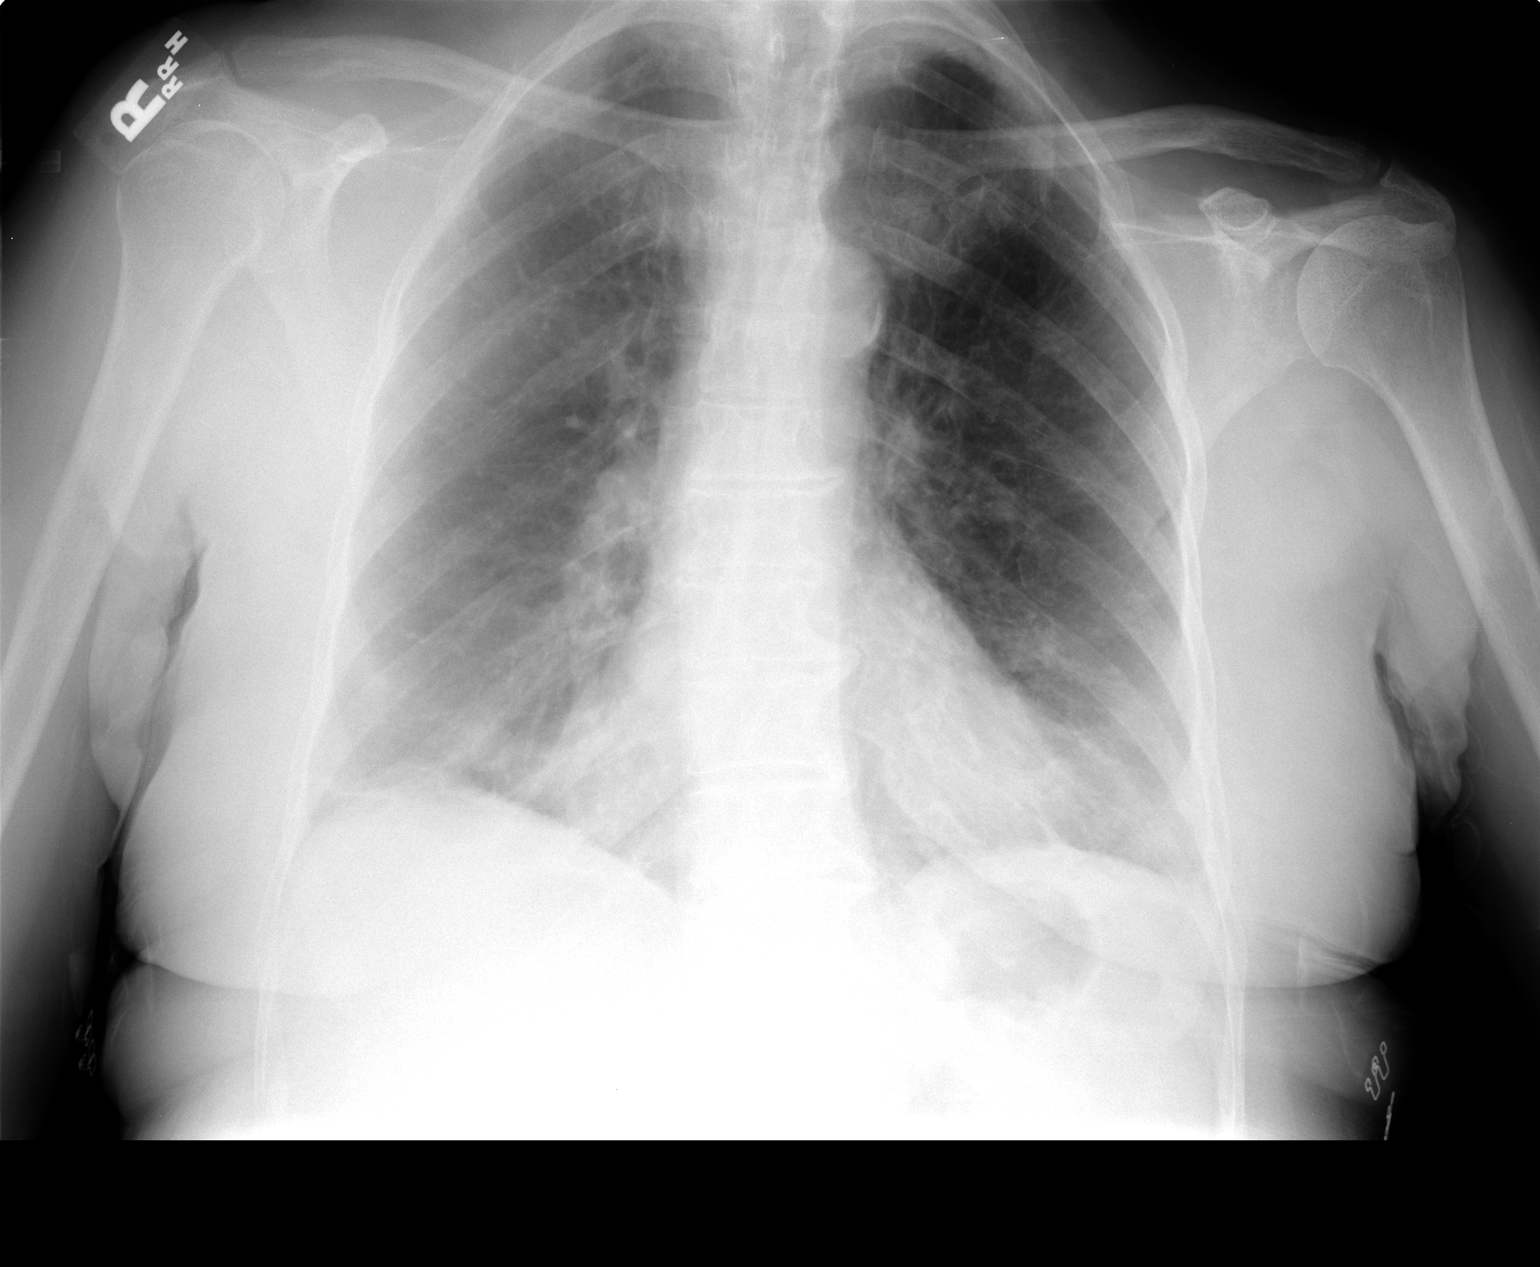

[view not recorded (2 of 2)]
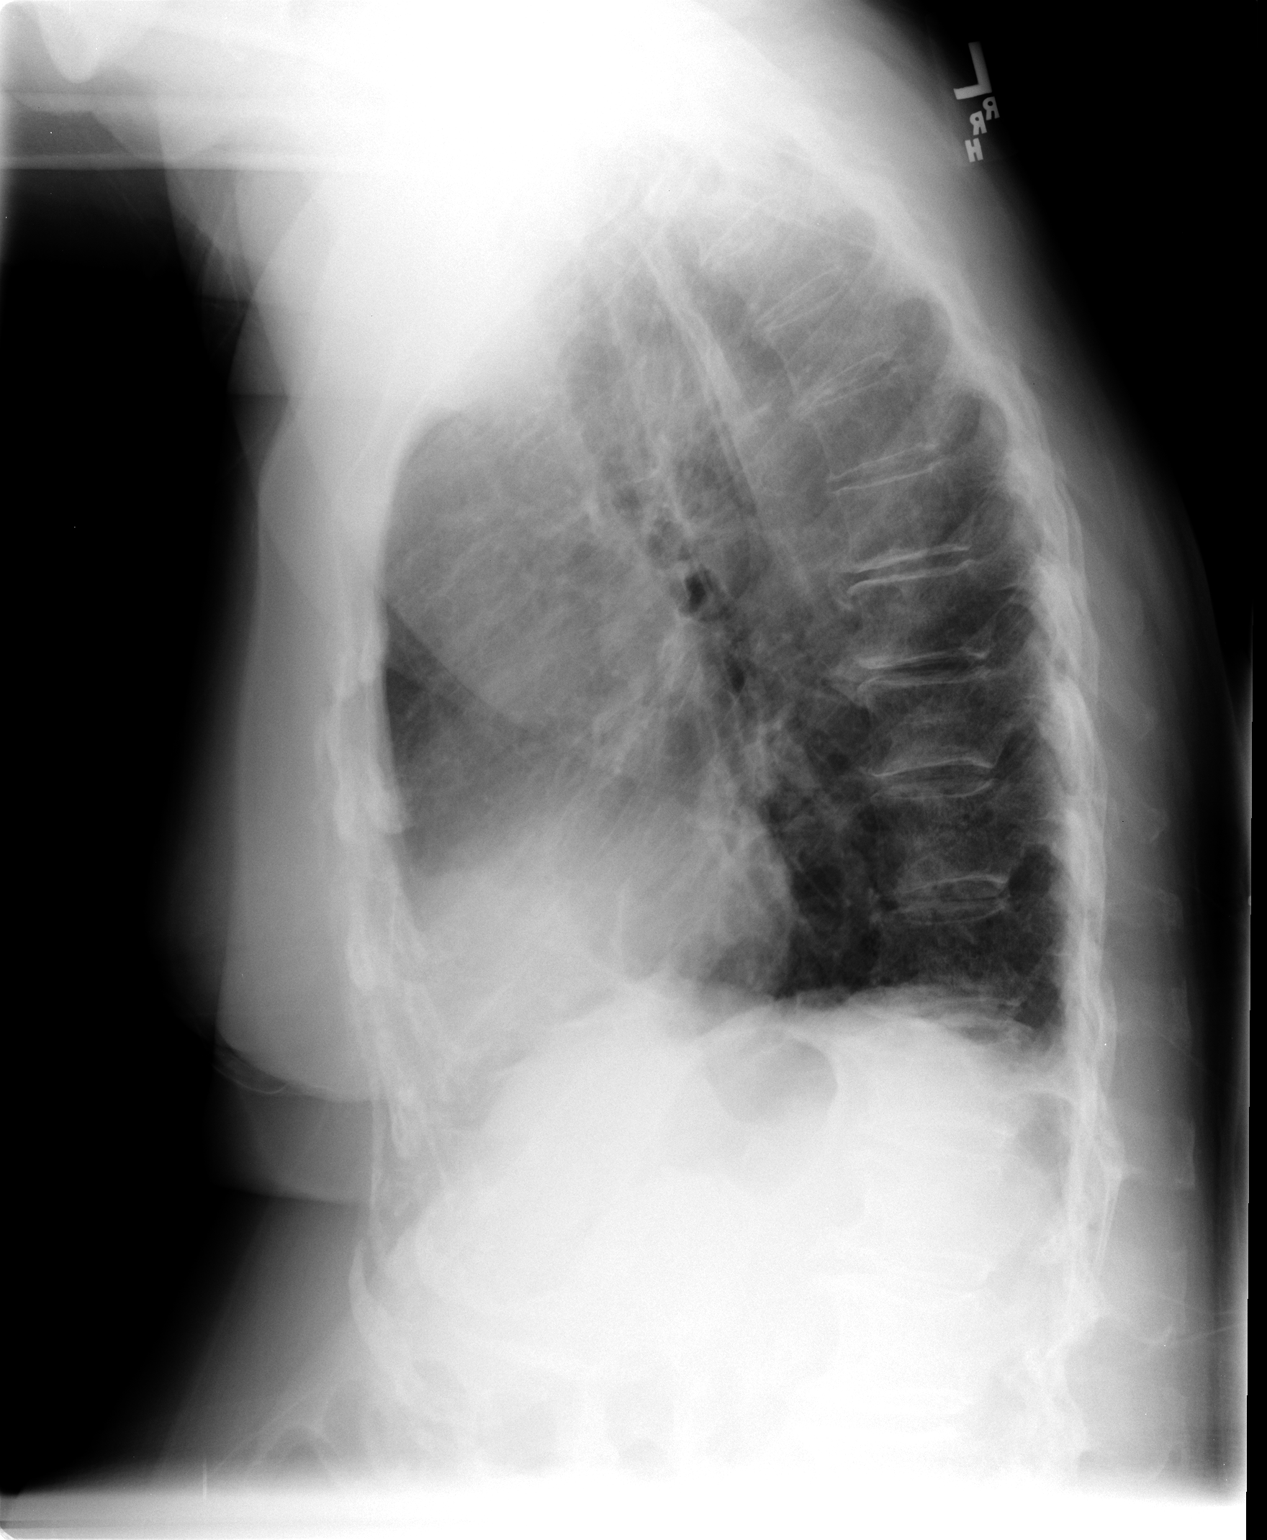

[2 of 2 positions shown; findings below may reference images not displayed]

FINDINGS: The cardiac silhouette, mediastinal and hilar contours are stable.
There are underlying emphysematous changes and bronchitic changes.
Low lung volumes with vascular crowding and atelectasis. Streaky
areas of atelectasis but no focal infiltrates or effusions. No
pulmonary edema.
IMPRESSION: Underlying chronic lung changes.

Low lung volumes with vascular crowding and atelectasis. No edema or
effusions.

## 2015-07-17 ENCOUNTER — Telehealth: Payer: Self-pay | Admitting: Family Medicine

## 2015-07-17 NOTE — Telephone Encounter (Signed)
Patient aware to follow up at her May appointment unless she gets sick and needs to be soon earlier.

## 2015-07-29 ENCOUNTER — Other Ambulatory Visit: Payer: Self-pay | Admitting: Family Medicine

## 2015-07-29 ENCOUNTER — Telehealth: Payer: Self-pay | Admitting: Family Medicine

## 2015-07-29 NOTE — Telephone Encounter (Signed)
Last seen Dr Laurance Flatten  This patients husband died yesterday   If approved route to nurse to call into [facility]

## 2015-07-29 NOTE — Telephone Encounter (Signed)
Please review and advise.

## 2015-07-29 NOTE — Telephone Encounter (Signed)
RX called in to [facility] per Dr Laurance Flatten

## 2015-07-29 NOTE — Telephone Encounter (Signed)
rx called into pharmacy

## 2015-07-29 NOTE — Telephone Encounter (Signed)
Authorize 30 days only. Then contact the patient letting them know that they will need an appointment before any further prescriptions can be sent in. 

## 2015-08-05 ENCOUNTER — Other Ambulatory Visit: Payer: Self-pay | Admitting: Family Medicine

## 2015-08-12 ENCOUNTER — Ambulatory Visit (INDEPENDENT_AMBULATORY_CARE_PROVIDER_SITE_OTHER): Payer: Medicare Other | Admitting: Family Medicine

## 2015-08-12 ENCOUNTER — Encounter: Payer: Self-pay | Admitting: Family Medicine

## 2015-08-12 VITALS — BP 142/65 | HR 76 | Temp 97.4°F | Ht 62.0 in | Wt 153.0 lb

## 2015-08-12 DIAGNOSIS — E119 Type 2 diabetes mellitus without complications: Secondary | ICD-10-CM | POA: Diagnosis not present

## 2015-08-12 DIAGNOSIS — J42 Unspecified chronic bronchitis: Secondary | ICD-10-CM | POA: Diagnosis not present

## 2015-08-12 MED ORDER — CEFDINIR 300 MG PO CAPS: 300.0000 mg | ORAL_CAPSULE | Freq: Two times a day (BID) | ORAL | Status: DC

## 2015-08-12 NOTE — Progress Notes (Signed)
Subjective:    Patient ID: [name] [name], female    DOB: 09/14/1931, 82 y.o.   MRN: [id number]  HPI Patient here today for cough and congestion that started about 2 days ago. Coughing up yellow sputum. She's had no fever or chills. In reviewing her chart there is frequent bronchitis. [name] has been stressed last 2 weeks after the sudden loss of her husband. We did spend some time talking about that and her coping abilities and support, which I encouraged her to use. She takes maintenance Symbicort and uses neb treatments as needed     Patient Active Problem List   Diagnosis Date Noted  . COPD (chronic obstructive pulmonary disease) ([place]) 05/29/2015  . Type 2 diabetes mellitus, controlled ([place]) 08/30/2014  . Rectal bleeding 03/07/2014  . BRBPR (bright red blood per rectum) 03/07/2014  . History of total left hip arthroplasty 01/25/2014  . Elevated liver enzymes 01/13/2014  . Edema 01/02/2014  . Hypoxia 01/02/2014  . S/P total hip arthroplasty 12/27/2013  . Chronic cough 10/26/2013  . Hyperlipidemia   . Anxiety   . Depressive disorder, not elsewhere classified   . Rhinitis   . Elevated blood sugar   . Atrophic vaginitis   . Postmenopausal    Outpatient Encounter Prescriptions as of 08/12/2015  Medication Sig  . albuterol (PROVENTIL) (2.5 MG/3ML) 0.083% nebulizer solution Take 3 mLs (2.5 mg total) by nebulization every 6 (six) hours as needed for wheezing or shortness of breath.  . ALPRAZolam (XANAX) 0.25 MG tablet TAKE 1/2 TABLET FOUR TIMES DAILY AS NEEDED  . amLODipine (NORVASC) 2.5 MG tablet Take 1 tablet (2.5 mg total) by mouth daily.  Marland Kitchen aspirin 81 MG tablet Take 81 mg by mouth daily.  . budesonide-formoterol (SYMBICORT) 160-4.5 MCG/ACT inhaler Inhale 2 puffs into the lungs 2 (two) times daily.  . calcium carbonate (OS-CAL) 600 MG TABS tablet Take 600 mg by mouth 2 (two) times daily with a meal.  . cholecalciferol (VITAMIN D) 1000 UNITS tablet Take 2,000 Units by mouth  daily.  Marland Kitchen escitalopram (LEXAPRO) 10 MG tablet TAKE ONE TABLET BY MOUTH EVERY DAY AS DIRECTED  . fluticasone (FLONASE) 50 MCG/ACT nasal spray Place 1 spray into both nostrils daily.  Marland Kitchen glucose blood test strip Use to check BG once a day to every other day.  Dx:  E11.9  . LORazepam (ATIVAN) 0.5 MG tablet Take 1 tablet (0.5 mg total) by mouth 2 (two) times daily as needed for anxiety.  . lovastatin (MEVACOR) 40 MG tablet TAKE 1 TABLET ONCE A DAY  . metFORMIN (GLUCOPHAGE-XR) 500 MG 24 hr tablet Take 1 tablet (500 mg total) by mouth daily with breakfast.  . ONETOUCH DELICA LANCETS 99991111 MISC Use to check BG once a day to every other day Dx: E 11.9   No facility-administered encounter medications on file as of 08/12/2015.      Review of Systems  Constitutional: Negative.   HENT: Positive for congestion (yellow drainage).   Eyes: Negative.   Respiratory: Positive for cough.   Cardiovascular: Negative.   Gastrointestinal: Negative.   Endocrine: Negative.   Genitourinary: Negative.   Musculoskeletal: Negative.   Skin: Negative.   Allergic/Immunologic: Negative.   Neurological: Negative.   Hematological: Negative.   Psychiatric/Behavioral: Negative.        Objective:   Physical Exam  Constitutional: She appears well-developed and well-nourished.  HENT:  Head: Normocephalic.  Mouth/Throat: Oropharynx is clear and moist.  Cardiovascular: Normal rate, regular rhythm and normal heart sounds.  Pulmonary/Chest: Effort normal. She has wheezes.    BP 142/65 mmHg  Pulse 76  Temp(Src) 97.4 F (36.3 C) (Oral)  Ht 5\' 2"  (1.575 m)  Wt 153 lb (69.4 kg)  BMI 27.98 kg/m2       Assessment & Plan:  1. Chronic bronchitis, unspecified chronic bronchitis type ([place]) Will treat 8 with Omnicef. I do not get the sense that we need to use steroids for this exacerbation but if her symptoms do not improve over the next several days that may be a useful addition  Wardell Honour MD

## 2015-08-20 ENCOUNTER — Other Ambulatory Visit: Payer: Medicare Other

## 2015-08-20 DIAGNOSIS — E559 Vitamin D deficiency, unspecified: Secondary | ICD-10-CM | POA: Diagnosis not present

## 2015-08-20 DIAGNOSIS — I10 Essential (primary) hypertension: Secondary | ICD-10-CM

## 2015-08-20 DIAGNOSIS — E785 Hyperlipidemia, unspecified: Secondary | ICD-10-CM | POA: Diagnosis not present

## 2015-08-20 DIAGNOSIS — E118 Type 2 diabetes mellitus with unspecified complications: Secondary | ICD-10-CM | POA: Diagnosis not present

## 2015-08-20 LAB — BAYER DCA HB A1C WAIVED: HB A1C: 6.4 % (ref <7.0)

## 2015-08-21 LAB — BMP8+EGFR
BUN / CREAT RATIO: 11 — AB (ref 12–28)
BUN: 9 mg/dL (ref 8–27)
CHLORIDE: 98 mmol/L (ref 96–106)
CO2: 21 mmol/L (ref 18–29)
Calcium: 9.7 mg/dL (ref 8.7–10.3)
Creatinine, Ser: 0.84 mg/dL (ref 0.57–1.00)
GFR calc Af Amer: 75 mL/min/{1.73_m2} (ref >59)
GFR calc non Af Amer: 65 mL/min/{1.73_m2} (ref >59)
GLUCOSE: 114 mg/dL — AB (ref 65–99)
POTASSIUM: 4.8 mmol/L (ref 3.5–5.2)
SODIUM: 136 mmol/L (ref 134–144)

## 2015-08-21 LAB — CBC WITH DIFFERENTIAL/PLATELET
Basophils Absolute: 0 10*3/uL (ref 0.0–0.2)
Basos: 1 %
EOS (ABSOLUTE): 0.1 10*3/uL (ref 0.0–0.4)
Eos: 2 %
HEMATOCRIT: 44.7 % (ref 34.0–46.6)
HEMOGLOBIN: 15 g/dL (ref 11.1–15.9)
IMMATURE GRANS (ABS): 0 10*3/uL (ref 0.0–0.1)
IMMATURE GRANULOCYTES: 0 %
Lymphocytes Absolute: 1.6 10*3/uL (ref 0.7–3.1)
Lymphs: 25 %
MCH: 30.4 pg (ref 26.6–33.0)
MCHC: 33.6 g/dL (ref 31.5–35.7)
MCV: 91 fL (ref 79–97)
MONOCYTES: 5 %
Monocytes Absolute: 0.3 10*3/uL (ref 0.1–0.9)
NEUTROS ABS: 4.3 10*3/uL (ref 1.4–7.0)
Neutrophils: 67 %
Platelets: 314 10*3/uL (ref 150–379)
RBC: 4.94 x10E6/uL (ref 3.77–5.28)
RDW: 14.4 % (ref 12.3–15.4)
WBC: 6.3 10*3/uL (ref 3.4–10.8)

## 2015-08-21 LAB — LIPID PANEL
CHOL/HDL RATIO: 2.5 ratio (ref 0.0–4.4)
Cholesterol, Total: 125 mg/dL (ref 100–199)
HDL: 50 mg/dL (ref >39)
LDL Calculated: 59 mg/dL (ref 0–99)
Triglycerides: 79 mg/dL (ref 0–149)
VLDL Cholesterol Cal: 16 mg/dL (ref 5–40)

## 2015-08-21 LAB — HEPATIC FUNCTION PANEL
ALK PHOS: 78 IU/L (ref 39–117)
ALT: 13 IU/L (ref 0–32)
AST: 16 IU/L (ref 0–40)
Albumin: 4.5 g/dL (ref 3.5–4.7)
BILIRUBIN TOTAL: 0.7 mg/dL (ref 0.0–1.2)
BILIRUBIN, DIRECT: 0.18 mg/dL (ref 0.00–0.40)
Total Protein: 6.7 g/dL (ref 6.0–8.5)

## 2015-08-21 LAB — VITAMIN D 25 HYDROXY (VIT D DEFICIENCY, FRACTURES): Vit D, 25-Hydroxy: 49.1 ng/mL (ref 30.0–100.0)

## 2015-08-27 DIAGNOSIS — H40013 Open angle with borderline findings, low risk, bilateral: Secondary | ICD-10-CM | POA: Diagnosis not present

## 2015-08-27 DIAGNOSIS — H02834 Dermatochalasis of left upper eyelid: Secondary | ICD-10-CM | POA: Diagnosis not present

## 2015-08-27 DIAGNOSIS — H53001 Unspecified amblyopia, right eye: Secondary | ICD-10-CM | POA: Diagnosis not present

## 2015-08-27 DIAGNOSIS — Z961 Presence of intraocular lens: Secondary | ICD-10-CM | POA: Diagnosis not present

## 2015-08-27 DIAGNOSIS — H02831 Dermatochalasis of right upper eyelid: Secondary | ICD-10-CM | POA: Diagnosis not present

## 2015-08-27 LAB — HM DIABETES EYE EXAM

## 2015-08-28 ENCOUNTER — Encounter: Payer: Self-pay | Admitting: Family Medicine

## 2015-08-28 ENCOUNTER — Other Ambulatory Visit: Payer: Self-pay | Admitting: Family Medicine

## 2015-08-28 ENCOUNTER — Ambulatory Visit (INDEPENDENT_AMBULATORY_CARE_PROVIDER_SITE_OTHER): Payer: Medicare Other | Admitting: Family Medicine

## 2015-08-28 VITALS — BP 110/59 | HR 74 | Temp 98.0°F | Ht 62.0 in | Wt 150.0 lb

## 2015-08-28 DIAGNOSIS — E118 Type 2 diabetes mellitus with unspecified complications: Secondary | ICD-10-CM | POA: Diagnosis not present

## 2015-08-28 DIAGNOSIS — J449 Chronic obstructive pulmonary disease, unspecified: Secondary | ICD-10-CM | POA: Diagnosis not present

## 2015-08-28 DIAGNOSIS — E785 Hyperlipidemia, unspecified: Secondary | ICD-10-CM | POA: Diagnosis not present

## 2015-08-28 DIAGNOSIS — I10 Essential (primary) hypertension: Secondary | ICD-10-CM

## 2015-08-28 DIAGNOSIS — F419 Anxiety disorder, unspecified: Secondary | ICD-10-CM

## 2015-08-28 DIAGNOSIS — R8290 Unspecified abnormal findings in urine: Secondary | ICD-10-CM

## 2015-08-28 LAB — URINALYSIS, COMPLETE
Bilirubin, UA: NEGATIVE
Glucose, UA: NEGATIVE
NITRITE UA: NEGATIVE
Protein, UA: NEGATIVE
SPEC GRAV UA: 1.02 (ref 1.005–1.030)
Urobilinogen, Ur: 0.2 mg/dL (ref 0.2–1.0)
pH, UA: 6 (ref 5.0–7.5)

## 2015-08-28 LAB — MICROSCOPIC EXAMINATION: WBC, UA: >30 /hpf — AB (ref 0–?)

## 2015-08-28 MED ORDER — ALPRAZOLAM 0.25 MG PO TABS: ORAL_TABLET | ORAL | Status: DC

## 2015-08-28 MED ORDER — CIPROFLOXACIN HCL 500 MG PO TABS: 500.0000 mg | ORAL_TABLET | Freq: Two times a day (BID) | ORAL | Status: DC

## 2015-08-28 NOTE — Telephone Encounter (Signed)
Patient last seen in office on5-1-17. Rx last filled on 4-17 for #60. Please advise and route to [facility]

## 2015-08-28 NOTE — Patient Instructions (Addendum)
Medicare Annual Wellness Visit  [facility] and the medical providers at [facility] strive to bring you the best medical care.  In doing so we not only want to address your current medical conditions and concerns but also to detect new conditions early and prevent illness, disease and health-related problems.    Medicare offers a yearly Wellness Visit which allows our clinical staff to assess your need for preventative services including immunizations, lifestyle education, counseling to decrease risk of preventable diseases and screening for fall risk and other medical concerns.    This visit is provided free of charge (no copay) for all Medicare recipients. The clinical pharmacists at [facility] have begun to conduct these Wellness Visits which will also include a thorough review of all your medications.    As you primary medical provider recommend that you make an appointment for your Annual Wellness Visit if you have not done so already this year.  You may set up this appointment before you leave today or you may call back [id number] and schedule an appointment.  Please make sure when you call that you mention that you are scheduling your Annual Wellness Visit with the clinical pharmacist so that the appointment may be made for the proper length of time.     Continue current medications. Continue good therapeutic lifestyle changes which include good diet and exercise. Fall precautions discussed with patient. If an FOBT was given today- please return it to our front desk. If you are over 16 years old - you may need Prevnar 13 or the adult Pneumonia vaccine.  **Flu shots are available--- please call and schedule a FLU-CLINIC appointment**  After your visit with [place] today you will receive a survey in the mail or online from [employer] regarding your care with [place]. Please take a moment to fill this out. Your feedback is very  important to [place] as you can help [place] better understand your patient needs as well as improve your experience and satisfaction. WE CARE ABOUT YOU!!!   Continue to monitor blood sugars at home Stay active Drink plenty of fluids Avoid all climbing Get involved in the community and in church

## 2015-08-28 NOTE — Telephone Encounter (Signed)
This is okay to refill 

## 2015-08-28 NOTE — Progress Notes (Signed)
Subjective:    Patient ID: [name], female    DOB: [birth year], 82 y.o.   MRN: [id number]  HPI Pt here for follow up and management of chronic medical problems which includes hypertension and hyperlipidemia. He is taking medications regularly.This the first visit since the patient lost her husband recently. He had pneumonia and was hospitalized and became septic and diet and a short period of time. Patient has 2 daughters and they are attending to her closely. She's had recent lab work done and this will be reviewed with her during the visit today. All the numbers were good. Her A1c was good and at goal. Her kidney function test liver function tests and CBC were all good. The patient today complains of a head cold recently and that her urine has a foul odor. The patient brings in blood sugars for review and I will be scanned into the record. All of these were overall very good. The patient denies chest pain shortness of breath trouble swallowing heartburn indigestion nausea vomiting diarrhea or blood in the stool. She does have urinary frequency and this foul odor but no burning when she passes her water. She seems to be doing extremely well considering that she just lost her husband. Her skin has improved greatly and she's been seeing Tiffany in response to this.     Patient Active Problem List   Diagnosis Date Noted  . COPD (chronic obstructive pulmonary disease) ([facility]) 05/29/2015  . Type 2 diabetes mellitus, controlled ([facility]) 08/30/2014  . Rectal bleeding 03/07/2014  . BRBPR (bright red blood per rectum) 03/07/2014  . History of total left hip arthroplasty 01/25/2014  . Elevated liver enzymes 01/13/2014  . Edema 01/02/2014  . Hypoxia 01/02/2014  . S/P total hip arthroplasty 12/27/2013  . Chronic cough 10/26/2013  . Hyperlipidemia   . Anxiety   . Depressive disorder, not elsewhere classified   . Rhinitis   . Elevated blood sugar   . Atrophic vaginitis   . Postmenopausal     Outpatient Encounter Prescriptions as of 08/28/2015  Medication Sig  . albuterol (PROVENTIL) (2.5 MG/3ML) 0.083% nebulizer solution Take 3 mLs (2.5 mg total) by nebulization every 6 (six) hours as needed for wheezing or shortness of breath.  . ALPRAZolam (XANAX) 0.25 MG tablet TAKE 1/2 TABLET FOUR TIMES DAILY AS NEEDED  . amLODipine (NORVASC) 2.5 MG tablet Take 1 tablet (2.5 mg total) by mouth daily.  Marland Kitchen aspirin 81 MG tablet Take 81 mg by mouth daily.  . budesonide-formoterol (SYMBICORT) 160-4.5 MCG/ACT inhaler Inhale 2 puffs into the lungs 2 (two) times daily.  . calcium carbonate (OS-CAL) 600 MG TABS tablet Take 600 mg by mouth 2 (two) times daily with a meal.  . cholecalciferol (VITAMIN D) 1000 UNITS tablet Take 2,000 Units by mouth daily.  Marland Kitchen escitalopram (LEXAPRO) 10 MG tablet TAKE ONE TABLET BY MOUTH EVERY DAY AS DIRECTED  . fluticasone (FLONASE) 50 MCG/ACT nasal spray Place 1 spray into both nostrils daily.  Marland Kitchen glucose blood test strip Use to check BG once a day to every other day.  Dx:  E11.9  . LORazepam (ATIVAN) 0.5 MG tablet Take 1 tablet (0.5 mg total) by mouth 2 (two) times daily as needed for anxiety.  . lovastatin (MEVACOR) 40 MG tablet TAKE 1 TABLET ONCE A DAY  . metFORMIN (GLUCOPHAGE-XR) 500 MG 24 hr tablet Take 1 tablet (500 mg total) by mouth daily with breakfast.  . ONETOUCH DELICA LANCETS 99991111 MISC Use to check BG once  a day to every other day Dx: E 11.9  . [DISCONTINUED] cefdinir (OMNICEF) 300 MG capsule Take 1 capsule (300 mg total) by mouth 2 (two) times daily.   No facility-administered encounter medications on file as of 08/28/2015.      Review of Systems  Constitutional: Negative.   HENT: Positive for congestion (recent).   Eyes: Negative.   Respiratory: Negative.   Cardiovascular: Negative.   Gastrointestinal: Negative.   Endocrine: Negative.   Genitourinary: Negative.        Urine odor  Musculoskeletal: Negative.   Skin: Negative.   Allergic/Immunologic:  Negative.   Neurological: Negative.   Hematological: Negative.   Psychiatric/Behavioral: Negative.        Objective:   Physical Exam  Constitutional: She is oriented to person, place, and time. She appears well-nourished. No distress.  Small frame and well-nourished and in good spirits  HENT:  Head: Normocephalic and atraumatic.  Right Ear: External ear normal.  Left Ear: External ear normal.  Nose: Nose normal.  Mouth/Throat: Oropharynx is clear and moist.  Eyes: Conjunctivae and EOM are normal. Pupils are equal, round, and reactive to light. Right eye exhibits no discharge. Left eye exhibits no discharge. No scleral icterus.  Neck: Normal range of motion. Neck supple. No thyromegaly present.  Without bruits or thyromegaly  Cardiovascular: Normal rate, regular rhythm, normal heart sounds and intact distal pulses.   No murmur heard. Heart has a regular rate and rhythm at 84/m  Pulmonary/Chest: Effort normal and breath sounds normal. No respiratory distress. She has no wheezes. She has no rales. She exhibits no tenderness.  No wheezes rhonchi or abnormal breasts and  Abdominal: Soft. Bowel sounds are normal. She exhibits no mass. There is tenderness. There is no rebound and no guarding.  No masses or organ enlargement or bruits. There is only generalized abdominal tenderness  Musculoskeletal: Normal range of motion. She exhibits no edema or tenderness.  Leg raising was good bilaterally  Lymphadenopathy:    She has no cervical adenopathy.  Neurological: She is alert and oriented to person, place, and time. She has normal reflexes. No cranial nerve deficit.  Skin: Skin is warm and dry. No rash noted.  Skin rash over most of her body especially her face and upper torso was greatly improved.  Psychiatric: She has a normal mood and affect. Her behavior is normal. Judgment and thought content normal.  Nursing note and vitals reviewed.  BP 110/59 mmHg  Pulse 74  Temp(Src) 98 F (36.7  C) (Oral)  Ht 5\' 2"  (1.575 m)  Wt 150 lb (68.04 kg)  BMI 27.43 kg/m2        Assessment & Plan:  1. Controlled type 2 diabetes mellitus with complication, without long-term current use of insulin (HCC) -The recent hemoglobin A1c indicates her blood sugar for the past 3 months as been under good control and she will continue with current treatment  2. Hyperlipidemia -All cholesterol numbers were also good and she will continue with current treatment and aggressive therapeutic lifestyle changes  3. Anxiety -We did refill her Xanax today which she takes as needed and considering the recent loss of her husband thinks she is doing well. She is trying to get involved in the community and more in her church.  4. Essential hypertension -The blood pressure is good today and she will continue with current treatment  5. Abnormal urine odor -We will call her with the results of the urine once it becomes available - Urinalysis, Complete -  Urine culture  6. Chronic obstructive pulmonary disease, unspecified COPD type ([facility]) -Continue to use inhalers regularly and Flonase regularly and drink plenty of fluids and avoid irritating environments  Meds ordered this encounter  Medications  . ALPRAZolam (XANAX) 0.25 MG tablet    Sig: TAKE 1/2 TABLET FOUR TIMES DAILY AS NEEDED    Dispense:  60 tablet    Refill:  1   Patient Instructions                       Medicare Annual Wellness Visit  [facility] and the medical providers at [facility] strive to bring you the best medical care.  In doing so we not only want to address your current medical conditions and concerns but also to detect new conditions early and prevent illness, disease and health-related problems.    Medicare offers a yearly Wellness Visit which allows our clinical staff to assess your need for preventative services including immunizations, lifestyle education, counseling to decrease risk of preventable  diseases and screening for fall risk and other medical concerns.    This visit is provided free of charge (no copay) for all Medicare recipients. The clinical pharmacists at [facility] have begun to conduct these Wellness Visits which will also include a thorough review of all your medications.    As you primary medical provider recommend that you make an appointment for your Annual Wellness Visit if you have not done so already this year.  You may set up this appointment before you leave today or you may call back [phone] and schedule an appointment.  Please make sure when you call that you mention that you are scheduling your Annual Wellness Visit with the clinical pharmacist so that the appointment may be made for the proper length of time.     Continue current medications. Continue good therapeutic lifestyle changes which include good diet and exercise. Fall precautions discussed with patient. If an FOBT was given today- please return it to our front desk. If you are over 74 years old - you may need Prevnar 82 or the adult Pneumonia vaccine.  **Flu shots are available--- please call and schedule a FLU-CLINIC appointment**  After your visit with [place] today you will receive a survey in the mail or online from [employer] regarding your care with [place]. Please take a moment to fill this out. Your feedback is very important to [place] as you can help [place] better understand your patient needs as well as improve your experience and satisfaction. WE CARE ABOUT YOU!!!   Continue to monitor blood sugars at home Stay active Drink plenty of fluids Avoid all climbing Get involved in the community and in church   Arrie Senate MD

## 2015-09-02 LAB — URINE CULTURE

## 2015-09-04 ENCOUNTER — Encounter: Payer: Self-pay | Admitting: Pharmacist

## 2015-09-04 ENCOUNTER — Telehealth: Payer: Self-pay | Admitting: Family Medicine

## 2015-09-04 ENCOUNTER — Ambulatory Visit (INDEPENDENT_AMBULATORY_CARE_PROVIDER_SITE_OTHER): Payer: Medicare Other | Admitting: Pharmacist

## 2015-09-04 DIAGNOSIS — E118 Type 2 diabetes mellitus with unspecified complications: Secondary | ICD-10-CM

## 2015-09-04 NOTE — Progress Notes (Signed)
Patient ID: [name] [name], female   DOB: 18-Feb-1932, 82 y.o.   MRN: [id number]  Subjective:    [name] [name] is a 82 y.o. female who presents for evaluation of Type 2 diabetes mellitus.  [name] [name] lost her husband April 2017 and she reports that he BG has been increasing since then.  Her BG was 150 this am and it is usually less than 120.  BG was 114 last evening.  She is upset and concerned that BG is elevated and confused about what she can eat.   She does not cook much or go out to eat since her husband died but daughter reports that family brings her food from time to time.   Diagnosed type 2 DM - 08/2014.   Known diabetic complications: none Cardiovascular risk factors: advanced age (older than 75 for men, 79 for women), diabetes mellitus, dyslipidemia and hypertension Current diabetic medications include none.   Current monitoring regimen: none and home blood tests - 1-2 times daily Home blood sugar records: patient reports BG range from 98 to 150 Any episodes of hypoglycemia? no  Is She on ACE inhibitor or angiotensin II receptor blocker?  No - tried lisinopril 1 year ago and was stopped due to cough    Objective:    There were no vitals taken for this visit.  A1c = 6.4% (08/20/2015)  Lab Review GLUCOSE (mg/dL)  Date Value  08/20/2015 114*  05/29/2015 105*  04/09/2015 108*   GLUCOSE, BLD (mg/dL)  Date Value  03/07/2014 180*  12/29/2013 133*  12/28/2013 154*   CO2 (mmol/L)  Date Value  08/20/2015 21  05/29/2015 23  04/09/2015 24   BUN (mg/dL)  Date Value  08/20/2015 9  05/29/2015 8  04/09/2015 15  03/07/2014 11  12/29/2013 20  12/28/2013 20   CREAT (mg/dL)  Date Value  12/26/2012 0.88  08/09/2012 0.77   CREATININE, SER (mg/dL)  Date Value  08/20/2015 0.84  05/29/2015 0.83  04/09/2015 0.87      Assessment:   Diabetes Mellitus type II, under good control.    Plan:    1.  Rx changes:  Continue metformin XR 500mg  take 1 tablet with  breakfast 2.  Discussed CHO counting diet.  Reviewed CHO serving sizes and meal idea list for breakfast, lunch, dinner and snack.  3.  Continue to check BG daily - reviewed HBG goals with patient.  4. Follow up: as needed for dietary education  Cherre Robins, PharmD, CPP, CDE

## 2015-09-04 NOTE — Telephone Encounter (Signed)
Spoke with pt and she states her BS have been elevated and she isn't eating as much and is very concerned. Pt given appt with Tammy Eckard this afternoon at 2:45.

## 2015-09-06 ENCOUNTER — Other Ambulatory Visit: Payer: Self-pay | Admitting: Urology

## 2015-09-06 DIAGNOSIS — Z87440 Personal history of urinary (tract) infections: Secondary | ICD-10-CM

## 2015-09-13 ENCOUNTER — Ambulatory Visit (HOSPITAL_COMMUNITY)
Admission: RE | Admit: 2015-09-13 | Discharge: 2015-09-13 | Disposition: A | Discharge status: Home / Self Care | Payer: Medicare Other | Source: Ambulatory Visit | Attending: Urology | Admitting: Urology

## 2015-09-13 DIAGNOSIS — Z87440 Personal history of urinary (tract) infections: Secondary | ICD-10-CM | POA: Diagnosis not present

## 2015-09-13 DIAGNOSIS — K429 Umbilical hernia without obstruction or gangrene: Secondary | ICD-10-CM | POA: Diagnosis not present

## 2015-09-13 DIAGNOSIS — I700 Atherosclerosis of aorta: Secondary | ICD-10-CM | POA: Diagnosis not present

## 2015-09-18 ENCOUNTER — Telehealth: Payer: Self-pay | Admitting: Family Medicine

## 2015-09-18 NOTE — Telephone Encounter (Signed)
Pt aware that Dr Jeffie Pollock will have to address CT renal and that they will call the pt.

## 2015-09-23 ENCOUNTER — Telehealth: Payer: Self-pay | Admitting: Family Medicine

## 2015-09-23 NOTE — Telephone Encounter (Signed)
Kidneys are normal on CT scan. There is no evidence of stones, blockage, or infection there was a suggestion however of gallstones

## 2015-09-24 ENCOUNTER — Ambulatory Visit (INDEPENDENT_AMBULATORY_CARE_PROVIDER_SITE_OTHER): Payer: Medicare Other | Admitting: Family Medicine

## 2015-09-24 ENCOUNTER — Encounter: Payer: Self-pay | Admitting: Family Medicine

## 2015-09-24 VITALS — BP 118/67 | HR 62 | Temp 97.0°F | Ht 62.0 in | Wt 144.8 lb

## 2015-09-24 DIAGNOSIS — M79604 Pain in right leg: Secondary | ICD-10-CM | POA: Diagnosis not present

## 2015-09-24 MED ORDER — GABAPENTIN 100 MG PO CAPS: ORAL_CAPSULE | ORAL | Status: DC

## 2015-09-24 NOTE — Progress Notes (Signed)
Subjective:    Patient ID: [name] [name], female    DOB: 19-Jun-1931, 82 y.o.   MRN: [id number]  HPI 82 year old female who complains of pain in her right leg. She is now about 2 years status post left total hip replacement. She does have type 2 diabetes treated with metformin. There has been no injury to the leg. There is no history of degenerative changes in the knee or hip but since she had that on her left hip she probably does have some changes by virtue of her age.  Patient Active Problem List   Diagnosis Date Noted  . COPD (chronic obstructive pulmonary disease) ([facility]) 05/29/2015  . Type 2 diabetes mellitus, controlled ([facility]) 08/30/2014  . Rectal bleeding 03/07/2014  . BRBPR (bright red blood per rectum) 03/07/2014  . History of total left hip arthroplasty 01/25/2014  . Elevated liver enzymes 01/13/2014  . Edema 01/02/2014  . Hypoxia 01/02/2014  . S/P total hip arthroplasty 12/27/2013  . Chronic cough 10/26/2013  . Hyperlipidemia   . Anxiety   . Depressive disorder, not elsewhere classified   . Rhinitis   . Atrophic vaginitis   . Postmenopausal    Outpatient Encounter Prescriptions as of 09/24/2015  Medication Sig  . albuterol (PROVENTIL) (2.5 MG/3ML) 0.083% nebulizer solution Take 3 mLs (2.5 mg total) by nebulization every 6 (six) hours as needed for wheezing or shortness of breath.  . ALPRAZolam (XANAX) 0.25 MG tablet TAKE 1/2 TABLET FOUR TIMES DAILY AS NEEDED  . amLODipine (NORVASC) 2.5 MG tablet Take 1 tablet (2.5 mg total) by mouth daily.  Marland Kitchen aspirin 81 MG tablet Take 81 mg by mouth daily.  . budesonide-formoterol (SYMBICORT) 160-4.5 MCG/ACT inhaler Inhale 2 puffs into the lungs 2 (two) times daily.  . calcium carbonate (OS-CAL) 600 MG TABS tablet Take 600 mg by mouth 2 (two) times daily with a meal.  . cholecalciferol (VITAMIN D) 1000 UNITS tablet Take 2,000 Units by mouth daily.  Marland Kitchen escitalopram (LEXAPRO) 10 MG tablet TAKE ONE TABLET BY MOUTH EVERY DAY AS DIRECTED  .  fluticasone (FLONASE) 50 MCG/ACT nasal spray Place 1 spray into both nostrils daily.  Marland Kitchen glucose blood test strip Use to check BG once a day to every other day.  Dx:  E11.9  . lovastatin (MEVACOR) 40 MG tablet TAKE 1 TABLET ONCE A DAY  . metFORMIN (GLUCOPHAGE-XR) 500 MG 24 hr tablet Take 1 tablet (500 mg total) by mouth daily with breakfast.  . ONETOUCH DELICA LANCETS 99991111 MISC Use to check BG once a day to every other day Dx: E 11.9  . [DISCONTINUED] ciprofloxacin (CIPRO) 500 MG tablet Take 1 tablet (500 mg total) by mouth 2 (two) times daily.   No facility-administered encounter medications on file as of 09/24/2015.      Review of Systems  Constitutional: Negative.   HENT: Negative.   Respiratory: Negative.   Cardiovascular: Negative.   Musculoskeletal: Positive for gait problem.       Objective:   Physical Exam  Constitutional: She is oriented to person, place, and time. She appears well-developed and well-nourished.  Cardiovascular: Normal rate.   Pulmonary/Chest: Effort normal.  Musculoskeletal:  Her gait is abnormal and probably a contributing factor to the pain in her leg. There is normal range of motion at the hip and knee and no crepitance appreciated. Pulses are present.  Neurological: She is alert and oriented to person, place, and time.   BP 118/67 mmHg  Pulse 62  Temp(Src) 97 F (36.1  C) (Oral)  Ht 5\' 2"  (1.575 m)  Wt 144 lb 12.8 oz (65.681 kg)  BMI 26.48 kg/m2        Assessment & Plan:   1. Right leg pain I believe the pain in her leg is probably the result of some early neuropathy secondary to diabetes but also related to her abnormal gait. I have tried to explain this to her. I have suggested physical therapy consult to assess her gait and make recommendations but also would also like to begin gabapentin. We'll begin with 100 mg at bedtime and titrate upwards until we get to 3 a day dosing. Goal would be to get her to about [phone] mg per day

## 2015-09-24 NOTE — Telephone Encounter (Signed)
Patient aware of lab results, she said she has an appointment this morning with Dr. Sabra Heck

## 2015-09-26 ENCOUNTER — Encounter: Payer: Self-pay | Admitting: Family Medicine

## 2015-09-26 ENCOUNTER — Ambulatory Visit (INDEPENDENT_AMBULATORY_CARE_PROVIDER_SITE_OTHER): Payer: Medicare Other | Admitting: Family Medicine

## 2015-09-26 VITALS — BP 125/61 | HR 78 | Temp 98.2°F | Ht 62.0 in | Wt 143.0 lb

## 2015-09-26 DIAGNOSIS — E785 Hyperlipidemia, unspecified: Secondary | ICD-10-CM

## 2015-09-26 DIAGNOSIS — J42 Unspecified chronic bronchitis: Secondary | ICD-10-CM

## 2015-09-26 DIAGNOSIS — E1169 Type 2 diabetes mellitus with other specified complication: Secondary | ICD-10-CM | POA: Diagnosis not present

## 2015-09-26 DIAGNOSIS — M79604 Pain in right leg: Secondary | ICD-10-CM | POA: Diagnosis not present

## 2015-09-26 DIAGNOSIS — E119 Type 2 diabetes mellitus without complications: Secondary | ICD-10-CM | POA: Insufficient documentation

## 2015-09-26 DIAGNOSIS — R05 Cough: Secondary | ICD-10-CM | POA: Diagnosis not present

## 2015-09-26 DIAGNOSIS — F419 Anxiety disorder, unspecified: Secondary | ICD-10-CM

## 2015-09-26 DIAGNOSIS — R7309 Other abnormal glucose: Secondary | ICD-10-CM | POA: Diagnosis not present

## 2015-09-26 DIAGNOSIS — R739 Hyperglycemia, unspecified: Secondary | ICD-10-CM

## 2015-09-26 DIAGNOSIS — R059 Cough, unspecified: Secondary | ICD-10-CM

## 2015-09-26 NOTE — Progress Notes (Signed)
Subjective:    Patient ID: [name] [name], female    DOB: 04-27-1931, 82 y.o.   MRN: [id number]  HPI Patient here today for cough and elevated Blood sugar that she thinks is coming from a new medication, Gabapentin. She is accompanied today by her daughter. The patient brings in blood sugars for review and there are a few 3 or 4 that are greater than 150. She is very concerned about this. All other blood sugar readings are fairly good. Her last A1c last month was 6.4%. She's had some cough and congestion and no fever. She is coughing up minimal sputum. She is using her Symbicort regularly. She is not using her nebulizer or taking any Mucinex.    Patient Active Problem List   Diagnosis Date Noted  . COPD (chronic obstructive pulmonary disease) ([place]) 05/29/2015  . Type 2 diabetes mellitus, controlled ([place]) 08/30/2014  . Rectal bleeding 03/07/2014  . BRBPR (bright red blood per rectum) 03/07/2014  . History of total left hip arthroplasty 01/25/2014  . Elevated liver enzymes 01/13/2014  . Edema 01/02/2014  . Hypoxia 01/02/2014  . S/P total hip arthroplasty 12/27/2013  . Chronic cough 10/26/2013  . Hyperlipidemia   . Anxiety   . Depressive disorder, not elsewhere classified   . Rhinitis   . Atrophic vaginitis   . Postmenopausal    Outpatient Encounter Prescriptions as of 09/26/2015  Medication Sig  . albuterol (PROVENTIL) (2.5 MG/3ML) 0.083% nebulizer solution Take 3 mLs (2.5 mg total) by nebulization every 6 (six) hours as needed for wheezing or shortness of breath.  . ALPRAZolam (XANAX) 0.25 MG tablet TAKE 1/2 TABLET FOUR TIMES DAILY AS NEEDED  . amLODipine (NORVASC) 2.5 MG tablet Take 1 tablet (2.5 mg total) by mouth daily.  Marland Kitchen aspirin 81 MG tablet Take 81 mg by mouth daily.  . budesonide-formoterol (SYMBICORT) 160-4.5 MCG/ACT inhaler Inhale 2 puffs into the lungs 2 (two) times daily.  . calcium carbonate (OS-CAL) 600 MG TABS tablet Take 600 mg by mouth 2 (two) times daily with a  meal.  . cholecalciferol (VITAMIN D) 1000 UNITS tablet Take 2,000 Units by mouth daily.  Marland Kitchen escitalopram (LEXAPRO) 10 MG tablet TAKE ONE TABLET BY MOUTH EVERY DAY AS DIRECTED  . fluticasone (FLONASE) 50 MCG/ACT nasal spray Place 1 spray into both nostrils daily.  Marland Kitchen gabapentin (NEURONTIN) 100 MG capsule Begin 1 tablet at bedtime. After 4 days and 1 tablet in the p.m. After 4 days take 3 times a day  . glucose blood test strip Use to check BG once a day to every other day.  Dx:  E11.9  . lovastatin (MEVACOR) 40 MG tablet TAKE 1 TABLET ONCE A DAY  . metFORMIN (GLUCOPHAGE-XR) 500 MG 24 hr tablet Take 1 tablet (500 mg total) by mouth daily with breakfast.  . ONETOUCH DELICA LANCETS 99991111 MISC Use to check BG once a day to every other day Dx: E 11.9   No facility-administered encounter medications on file as of 09/26/2015.      Review of Systems  Constitutional: Negative.   HENT: Negative.  Negative for congestion.        Allergies  Eyes: Negative.   Respiratory: Positive for cough.   Cardiovascular: Negative.   Gastrointestinal: Negative.   Endocrine: Negative.        Elevated BS  Genitourinary: Negative.   Musculoskeletal: Positive for arthralgias (leg pains).  Skin: Negative.   Allergic/Immunologic: Negative.   Neurological: Negative.   Hematological: Negative.   Psychiatric/Behavioral: Negative.  Objective:   Physical Exam  Constitutional: She is oriented to person, place, and time. She appears distressed.  HENT:  Head: Normocephalic and atraumatic.  Right Ear: External ear normal.  Left Ear: External ear normal.  Nose: Nose normal.  Mouth/Throat: Oropharynx is clear and moist.  Well-hydrated  Eyes: Conjunctivae and EOM are normal. Pupils are equal, round, and reactive to light. Right eye exhibits no discharge. Left eye exhibits no discharge. No scleral icterus.  Neck: Normal range of motion. Neck supple. No thyromegaly present.  Cardiovascular: Normal rate, regular  rhythm and normal heart sounds.   No murmur heard. Pulmonary/Chest: Effort normal. No respiratory distress. She has wheezes. She has no rales.  A few scattered wheezes. No rhonchi or rales. Minimal congestion with coughing.  Abdominal: She exhibits no mass.  Musculoskeletal: Normal range of motion. She exhibits no edema.  Lymphadenopathy:    She has no cervical adenopathy.  Neurological: She is alert and oriented to person, place, and time.  Skin: Skin is warm and dry. No rash noted.  Psychiatric: She has a normal mood and affect. Her behavior is normal. Judgment and thought content normal.  Nursing note and vitals reviewed.  BP 125/61 mmHg  Pulse 78  Temp(Src) 98.2 F (36.8 C) (Oral)  Ht 5\' 2"  (1.575 m)  Wt 143 lb (64.864 kg)  BMI 26.15 kg/m2        Assessment & Plan:  1. Right leg pain -Continue with gabapentin 100 mg at bedtime  2. Elevated blood sugar -The blood sugar was 168 this morning but overall blood sugars have been good and she will continue with her current treatment and will try to improve her therapeutic lifestyle changes which include diet and especially exercise.  3. Cough -She is been instructed to take Mucinex maximum strength 1 twice daily for cough and congestion  4. Chronic bronchitis, unspecified chronic bronchitis type ([place]) -She will continue with plenty of fluids use her inhalers more regularly and give [place] a call back mid week next week regarding her progress  5. Type 2 diabetes mellitus with hyperlipidemia ([facility]) -Continue checking blood sugars as doing and an appointment will be scheduled with the clinical pharmacist to review her readings and her diet in about 4 weeks to reassure her and give her more confidence in the variation of the readings and the effort she needs to make with diet and exercise to complement and help her blood sugars  6. Anxiety -The patient just lost her husband and he use to check all of her blood sugars. One of her  daughters is helping with her blood sugars and another is now helping her with her nebulizer treatments. I have reassured her and told her that she was doing well and there her blood sugars have been under very good control with the last A1c being 6.4.  7. Hyperlipidemia -Continue with lovastatin.  Patient Instructions  Use Mucinex twice daily with a large glass of water Use nasal saline regularly Continue to use Flonase 1 spray each nostril at bedtime Continue Symbicort 2 puffs twice daily and rinse mouth after using Use albuterol nebulizer a little bit more regularly for a few days because of the cough and congestion at least once at night and in the morning Drink plenty of fluids and stay well hydrated Have your daughter call us next Wednesday with your progress Try to get more active Return to clinic to see the clinical pharmacists in 4 weeks to review your blood sugar control at  that time and bring blood sugar readings with you then Continue gabapentin 100 mg at bedtime   Arrie Senate MD

## 2015-09-26 NOTE — Patient Instructions (Addendum)
Use Mucinex twice daily with a large glass of water Use nasal saline regularly Continue to use Flonase 1 spray each nostril at bedtime Continue Symbicort 2 puffs twice daily and rinse mouth after using Use albuterol nebulizer a little bit more regularly for a few days because of the cough and congestion at least once at night and in the morning Drink plenty of fluids and stay well hydrated Have your daughter call us next Wednesday with your progress Try to get more active Return to clinic to see the clinical pharmacists in 4 weeks to review your blood sugar control at that time and bring blood sugar readings with you then Continue gabapentin 100 mg at bedtime

## 2015-10-01 ENCOUNTER — Telehealth: Payer: Self-pay | Admitting: Family Medicine

## 2015-10-01 MED ORDER — LEVOFLOXACIN 500 MG PO TABS: 500.0000 mg | ORAL_TABLET | Freq: Every day | ORAL | Status: DC

## 2015-10-01 NOTE — Telephone Encounter (Signed)
Patient states that she is still coughing and congestion. She is using her breathing treatments, mucinex, and inhalers and she is still blowing and spitting out yellow mucous. Please advise.

## 2015-10-01 NOTE — Telephone Encounter (Signed)
Please add Levaquin 500 one daily for 7 days Continue Mucinex Continue drinking plenty of fluids Make appointment for follow-up in 7-10 days

## 2015-10-01 NOTE — Telephone Encounter (Signed)
Patient aware and levaquin sent to pharmacy.  Appointment scheduled for Tuesday 10/08/15 with Dettinger.

## 2015-10-08 ENCOUNTER — Ambulatory Visit (INDEPENDENT_AMBULATORY_CARE_PROVIDER_SITE_OTHER): Payer: Medicare Other | Admitting: Family Medicine

## 2015-10-08 ENCOUNTER — Encounter: Payer: Self-pay | Admitting: Family Medicine

## 2015-10-08 ENCOUNTER — Ambulatory Visit (INDEPENDENT_AMBULATORY_CARE_PROVIDER_SITE_OTHER): Payer: Medicare Other

## 2015-10-08 DIAGNOSIS — R05 Cough: Secondary | ICD-10-CM

## 2015-10-08 DIAGNOSIS — J189 Pneumonia, unspecified organism: Secondary | ICD-10-CM | POA: Diagnosis not present

## 2015-10-08 DIAGNOSIS — R0989 Other specified symptoms and signs involving the circulatory and respiratory systems: Secondary | ICD-10-CM | POA: Diagnosis not present

## 2015-10-08 DIAGNOSIS — R053 Chronic cough: Secondary | ICD-10-CM

## 2015-10-08 MED ORDER — DOXYCYCLINE HYCLATE 100 MG PO TABS: 100.0000 mg | ORAL_TABLET | Freq: Two times a day (BID) | ORAL | Status: DC

## 2015-10-08 NOTE — Progress Notes (Signed)
BP 111/63 mmHg  Pulse 60  Temp(Src) 97.1 F (36.2 C) (Oral)  Ht 5\' 2"  (1.575 m)  Wt 143 lb 6.4 oz (65.046 kg)  BMI 26.22 kg/m2  SpO2 96%   Subjective:    Patient ID: [name] [name], female    DOB: 1931/09/13, 82 y.o.   MRN: [id number]  HPI: [name] [name] is a 82 y.o. female presenting on 10/08/2015 for URI   HPI Chest congestion and cough that is persistent Patient comes in today with chest congestion and persistent cough that has been going on for the past 2-3 weeks. She was seen 1 week ago by one of my colleagues and was prescribed Levaquin and told to use Mucinex. Since that point she has had some improvement but it came back after she finished the course of the antibiotic. She denies any fevers or chills or shortness of breath or wheezing. Her cough is been productive of yellow-green sputum.  Relevant past medical, surgical, family and social history reviewed and updated as indicated. Interim medical history since our last visit reviewed. Allergies and medications reviewed and updated.  Review of Systems  Constitutional: Negative for fever and chills.  HENT: Positive for postnasal drip, rhinorrhea, sinus pressure, sneezing and sore throat. Negative for congestion, ear discharge and ear pain.   Eyes: Negative for pain, redness and visual disturbance.  Respiratory: Negative for chest tightness and shortness of breath.   Cardiovascular: Negative for chest pain and leg swelling.  Genitourinary: Negative for dysuria and difficulty urinating.  Musculoskeletal: Negative for back pain and gait problem.  Skin: Negative for rash.  Neurological: Negative for light-headedness and headaches.  Psychiatric/Behavioral: Negative for behavioral problems and agitation.  All other systems reviewed and are negative.   Per HPI unless specifically indicated above     Medication List       This list is accurate as of: 10/08/15 10:16 AM.  Always use your most recent med list.                 albuterol (2.5 MG/3ML) 0.083% nebulizer solution  Commonly known as:  PROVENTIL  Take 3 mLs (2.5 mg total) by nebulization every 6 (six) hours as needed for wheezing or shortness of breath.     ALPRAZolam 0.25 MG tablet  Commonly known as:  XANAX  TAKE 1/2 TABLET FOUR TIMES DAILY AS NEEDED     amLODipine 2.5 MG tablet  Commonly known as:  NORVASC  Take 1 tablet (2.5 mg total) by mouth daily.     aspirin 81 MG tablet  Take 81 mg by mouth daily.     budesonide-formoterol 160-4.5 MCG/ACT inhaler  Commonly known as:  SYMBICORT  Inhale 2 puffs into the lungs 2 (two) times daily.     calcium carbonate 600 MG Tabs tablet  Commonly known as:  OS-CAL  Take 600 mg by mouth 2 (two) times daily with a meal.     cholecalciferol 1000 units tablet  Commonly known as:  VITAMIN D  Take 2,000 Units by mouth daily.     doxycycline 100 MG tablet  Commonly known as:  VIBRA-TABS  Take 1 tablet (100 mg total) by mouth 2 (two) times daily. 1 po bid     escitalopram 10 MG tablet  Commonly known as:  LEXAPRO  TAKE ONE TABLET BY MOUTH EVERY DAY AS DIRECTED     fluticasone 50 MCG/ACT nasal spray  Commonly known as:  FLONASE  Place 1 spray into both nostrils daily.  gabapentin 100 MG capsule  Commonly known as:  NEURONTIN  Begin 1 tablet at bedtime. After 4 days and 1 tablet in the p.m. After 4 days take 3 times a day     glucose blood test strip  Use to check BG once a day to every other day.  Dx:  E11.9     levofloxacin 500 MG tablet  Commonly known as:  LEVAQUIN  Take 1 tablet (500 mg total) by mouth daily.     lovastatin 40 MG tablet  Commonly known as:  MEVACOR  TAKE 1 TABLET ONCE A DAY     metFORMIN 500 MG 24 hr tablet  Commonly known as:  GLUCOPHAGE-XR  Take 1 tablet (500 mg total) by mouth daily with breakfast.     ONETOUCH DELICA LANCETS 99991111 Misc  Use to check BG once a day to every other day Dx: E 11.9           Objective:    BP 111/63 mmHg  Pulse 60   Temp(Src) 97.1 F (36.2 C) (Oral)  Ht 5\' 2"  (1.575 m)  Wt 143 lb 6.4 oz (65.046 kg)  BMI 26.22 kg/m2  SpO2 96%  Wt Readings from Last 3 Encounters:  10/08/15 143 lb 6.4 oz (65.046 kg)  09/26/15 143 lb (64.864 kg)  09/24/15 144 lb 12.8 oz (65.681 kg)    Physical Exam  Constitutional: She is oriented to person, place, and time. She appears well-developed and well-nourished. No distress.  Eyes: Conjunctivae and EOM are normal. Pupils are equal, round, and reactive to light.  Cardiovascular: Normal rate, regular rhythm, normal heart sounds and intact distal pulses.   No murmur heard. Pulmonary/Chest: Effort normal. No respiratory distress. She has no wheezes. She has rales (Patient has crackles near the hilar region on posterior. Otherwise clear lungs).  Musculoskeletal: Normal range of motion. She exhibits no edema or tenderness.  Neurological: She is alert and oriented to person, place, and time. Coordination normal.  Skin: Skin is warm and dry. No rash noted. She is not diaphoretic.  Psychiatric: She has a normal mood and affect. Her behavior is normal.  Nursing note and vitals reviewed. Chest x-ray: Perihilar atelectasis versus new pneumonia, await final read by radiologist    Assessment & Plan:   Problem List Items Addressed This Visit    None    Visit Diagnoses    CAP (community acquired pneumonia)        Relevant Medications    doxycycline (VIBRA-TABS) 100 MG tablet    Other Relevant Orders    DG Chest 2 View        Follow up plan: Return in about 2 weeks (around 10/22/2015), or if symptoms worsen or fail to improve, for Follow-up cough and pneumonia.  Counseling provided for all of the vaccine components Orders Placed This Encounter  Procedures  . DG Chest 2 View    Caryl Pina, MD [facility] 10/08/2015, 10:16 AM

## 2015-10-10 ENCOUNTER — Telehealth: Payer: Self-pay | Admitting: Family Medicine

## 2015-10-10 DIAGNOSIS — S0101XA Laceration without foreign body of scalp, initial encounter: Secondary | ICD-10-CM | POA: Diagnosis not present

## 2015-10-10 DIAGNOSIS — S0990XA Unspecified injury of head, initial encounter: Secondary | ICD-10-CM | POA: Diagnosis not present

## 2015-10-10 NOTE — Telephone Encounter (Signed)
Appointment scheduled for 7/14 with Dettinger @ 8:55am.

## 2015-10-17 ENCOUNTER — Encounter: Payer: Self-pay | Admitting: Family Medicine

## 2015-10-17 ENCOUNTER — Ambulatory Visit (INDEPENDENT_AMBULATORY_CARE_PROVIDER_SITE_OTHER): Payer: Medicare Other | Admitting: Family Medicine

## 2015-10-17 VITALS — BP 116/53 | HR 58 | Temp 96.9°F | Ht 62.0 in | Wt 142.8 lb

## 2015-10-17 DIAGNOSIS — S0191XD Laceration without foreign body of unspecified part of head, subsequent encounter: Secondary | ICD-10-CM | POA: Diagnosis not present

## 2015-10-17 DIAGNOSIS — M25552 Pain in left hip: Secondary | ICD-10-CM

## 2015-10-17 NOTE — Progress Notes (Signed)
   HPI  Patient presents today here for staple removal from a head laceration  Patient explains that 7 days ago she fell on the occipital area of her head causing a large bleed. She was seen in urgent care and 2 staples were placed. She did not lose consciousness, bleeding has been controlled easily.  Left hip pain Has bruising left hip from the fall. She has no problem moving her hip or walking. She has history of hip replacement in that hip.   She's done well since that time. She also has  PMH: Smoking status noted ROS: Per HPI  Objective: BP 116/53 mmHg  Pulse 58  Temp(Src) 96.9 F (36.1 C) (Oral)  Ht 5\' 2"  (1.575 m)  Wt 142 lb 12.8 oz (64.774 kg)  BMI 26.11 kg/m2 Gen: NAD, alert, cooperative with exam HEENT: NCAT CV: RRR, good S1/S2, no murmur Resp: CTABL, no wheezes, non-labored Ext: No edema, warm Neuro: Alert and oriented, No gross deficits  Musculoskeletal Negative Faber test, small yellow bruise on the left greater trochanter  Skin Occipital head with small thick heme crusting with 2 staples in place. Area was cleaned with alcohol and 2 staples were removed without any bleeding. Patient tolerated easily.  Assessment and plan:  # Head laceration Healing well, staples removed after 7 days Discussed usual wound care and course of illness.  # Hip pain Hip exam reassuring, if pain does not improve or patient has worsening pain consider x-ray, for now watchful waiting Supportive care discussed including ice.   Laroy Apple, MD [facility] 10/17/2015, 8:46 AM

## 2015-10-23 ENCOUNTER — Ambulatory Visit: Payer: Medicare Other | Admitting: Family Medicine

## 2015-10-24 ENCOUNTER — Ambulatory Visit (INDEPENDENT_AMBULATORY_CARE_PROVIDER_SITE_OTHER): Payer: Medicare Other | Admitting: Pharmacist

## 2015-10-24 ENCOUNTER — Encounter: Payer: Self-pay | Admitting: Pharmacist

## 2015-10-24 VITALS — Ht 62.0 in | Wt 140.5 lb

## 2015-10-24 DIAGNOSIS — E119 Type 2 diabetes mellitus without complications: Secondary | ICD-10-CM

## 2015-10-24 NOTE — Patient Instructions (Addendum)
Goal Blood glucose:    Fasting (before meals) = 80 to 130   Within 2 hours of eating = less than 180  Try to have no more than 3 of these foods per meal:  Fruit:   1/2 cup or once piece (baseball size)- apples, pears, pineapple, peaches, oranges  1 cup - berries, melons  1/2 banana or grapefruit  Stachy Vegetables:   1/2 cup potatoes (white or sweet), corn, peas  Other starches:   1 piece of bread  1/2 cup rice or pasta  4 inch pancake    These foods you can eat more freely: Proteins:   Fish  Chicken or [place]  Beef or pork (1 or 2 servings per week)  Eggs  Nuts (peanuts, walnuts, almonds, pistachios)  Cheese  Non starchy vegetables:  Green beans  Broccoli or cauliflower  Lettuce, greens, cabbage  Brussel Sprout  Carrots  Onions and peppers  Celery  Tomatoes  Asparagus  Eggplant  Cucumbers  Squash and Zucchini

## 2015-10-24 NOTE — Progress Notes (Signed)
Patient ID: [name] [name], female   DOB: [birth year], 82 y.o.   MRN: [id number]  Subjective:    [name] [name] is a 82 y.o. female who presents for evaluation of Type 2 diabetes mellitus.  [name] [name] lost her husband April 2017.  She is adjusting fairly well but gets a little emotional when talking about him and voices how difficult it is to live alone.  She has great support from her children/  She continues to be very concerned about what she is eating and how it is affecting her BG.  Last night her BG was 150 (about 2.5 hours after dinner) and she was so upset that she did not eat the pieces of zucchini bread that her friend brought her.  Diagnosed type 2 DM - 08/2014.  Last A1c =6.4% (08/20/2015)  Known diabetic complications: none Cardiovascular risk factors: advanced age (older than 30 for men, 39 for women), diabetes mellitus, dyslipidemia and hypertension Current diabetic medications include none.   Current monitoring regimen: checked 1-2 times daily Home blood sugar records: patient reports BG range from 77 to 150 7 day avg = 121 14 day avg = 116 30 day avg = 120 90 day avg = 120  Any episodes of hypoglycemia? no  Is She on ACE inhibitor or angiotensin II receptor blocker?  No - tried lisinopril 1 year ago and was stopped due to cough  Objective:    Ht 5\' 2"  (1.575 m)  Wt 140 lb 8 oz (63.73 kg)  BMI 25.69 kg/m2  A1c = 6.4% (08/20/2015)  Lab Review GLUCOSE (mg/dL)  Date Value  08/20/2015 114*  05/29/2015 105*  04/09/2015 108*   GLUCOSE, BLD (mg/dL)  Date Value  03/07/2014 180*  12/29/2013 133*  12/28/2013 154*   CO2 (mmol/L)  Date Value  08/20/2015 21  05/29/2015 23  04/09/2015 24   BUN (mg/dL)  Date Value  08/20/2015 9  05/29/2015 8  04/09/2015 15  03/07/2014 11  12/29/2013 20  12/28/2013 20   CREAT (mg/dL)  Date Value  12/26/2012 0.88  08/09/2012 0.77   CREATININE, SER (mg/dL)  Date Value  08/20/2015 0.84  05/29/2015 0.83  04/09/2015  0.87      Assessment:   Diabetes Mellitus type II, under good control.    Plan:    1.  Rx changes:  Continue metformin XR 500mg  take 1 tablet with breakfast 2.  Discussed CHO counting diet.  Reviewed CHO serving sizes and meal idea list for breakfast, lunch, dinner and snack.  Tried to ease patient's concerns about eating and food choices.  3.  Continue to check BG daily - reviewed HBG goals with patient.  4. Follow up: as needed for dietary education  Cherre Robins, PharmD, CPP, CDE

## 2015-10-25 ENCOUNTER — Ambulatory Visit (INDEPENDENT_AMBULATORY_CARE_PROVIDER_SITE_OTHER): Payer: Medicare Other | Admitting: Family Medicine

## 2015-10-25 ENCOUNTER — Other Ambulatory Visit: Payer: Self-pay | Admitting: Family Medicine

## 2015-10-25 ENCOUNTER — Encounter: Payer: Self-pay | Admitting: Family Medicine

## 2015-10-25 VITALS — BP 131/69 | HR 63 | Temp 97.6°F | Ht 62.0 in | Wt 142.4 lb

## 2015-10-25 DIAGNOSIS — J42 Unspecified chronic bronchitis: Secondary | ICD-10-CM | POA: Diagnosis not present

## 2015-10-25 DIAGNOSIS — L570 Actinic keratosis: Secondary | ICD-10-CM

## 2015-10-25 NOTE — Assessment & Plan Note (Signed)
Breathing did much better, stable, no issues currently

## 2015-10-25 NOTE — Telephone Encounter (Signed)
Please review and advise.

## 2015-10-25 NOTE — Telephone Encounter (Signed)
Last seen 10/25/15  Dr Dettinger  If approved route to nurse to call into [facility]

## 2015-10-25 NOTE — Telephone Encounter (Signed)
Forward this to Dr. Laurance Flatten as he gets back on Monday and he is the primary

## 2015-10-25 NOTE — Progress Notes (Signed)
BP 131/69 mmHg  Pulse 63  Temp(Src) 97.6 F (36.4 C) (Oral)  Ht 5\' 2"  (1.575 m)  Wt 142 lb 6.4 oz (64.592 kg)  BMI 26.04 kg/m2   Subjective:    Patient ID: [name] [name], female    DOB: May 26, 1931, 82 y.o.   MRN: [id number]  HPI: [name] [name] is a 82 y.o. female presenting on 10/25/2015 for Follow-up   HPI COPD follow-up Patient is coming in today for a COPD follow-up. She was seen recently and was having a lot of shortness of breath but now she is doing a lot better. She is on Symbicort and albuterol and using Flonase and still has shortness of breath with exertion more than 2 blocks but it is much improved from where she was previously. She denies any fevers or chills. She still has her cough but it is back to what it was before she got sick most recently. She has intermittent wheezing and currently is using her albuterol inhaler 2-3 times a week.  Skin lesion Patient has a skin lesion on the back of her neck that is an ongoing thing that's been there for some time. Last year she had a biopsy by [name] [name] and was found that it was noncancerous. She denies any drainage or any major changes with that but that it just gets sometimes irritated and then she is just concerned about it possibly becoming cancer in the future.  Relevant past medical, surgical, family and social history reviewed and updated as indicated. Interim medical history since our last visit reviewed. Allergies and medications reviewed and updated.  Review of Systems  Constitutional: Negative for fever and chills.  HENT: Negative for congestion, ear discharge, ear pain and sinus pressure.   Eyes: Negative for redness and visual disturbance.  Respiratory: Positive for shortness of breath. Negative for cough, chest tightness and wheezing.   Cardiovascular: Negative for chest pain, palpitations and leg swelling.  Genitourinary: Negative for dysuria and difficulty urinating.  Musculoskeletal: Negative for back  pain and gait problem.  Skin: Negative for rash.  Neurological: Negative for light-headedness and headaches.  Psychiatric/Behavioral: Negative for behavioral problems and agitation.  All other systems reviewed and are negative.   Per HPI unless specifically indicated above     Medication List       This list is accurate as of: 10/25/15  9:16 AM.  Always use your most recent med list.               albuterol (2.5 MG/3ML) 0.083% nebulizer solution  Commonly known as:  PROVENTIL  Take 3 mLs (2.5 mg total) by nebulization every 6 (six) hours as needed for wheezing or shortness of breath.     ALPRAZolam 0.25 MG tablet  Commonly known as:  XANAX  TAKE 1/2 TABLET FOUR TIMES DAILY AS NEEDED     amLODipine 2.5 MG tablet  Commonly known as:  NORVASC  Take 1 tablet (2.5 mg total) by mouth daily.     aspirin 81 MG tablet  Take 81 mg by mouth every other day.     budesonide-formoterol 160-4.5 MCG/ACT inhaler  Commonly known as:  SYMBICORT  Inhale 2 puffs into the lungs 2 (two) times daily.     calcium carbonate 600 MG Tabs tablet  Commonly known as:  OS-CAL  Take 600 mg by mouth 2 (two) times daily with a meal.     cholecalciferol 1000 units tablet  Commonly known as:  VITAMIN D  Take 2,000  Units by mouth daily.     escitalopram 10 MG tablet  Commonly known as:  LEXAPRO  TAKE ONE TABLET BY MOUTH EVERY DAY AS DIRECTED     fluticasone 50 MCG/ACT nasal spray  Commonly known as:  FLONASE  Place 1 spray into both nostrils daily.     gabapentin 100 MG capsule  Commonly known as:  NEURONTIN  Begin 1 tablet at bedtime. After 4 days and 1 tablet in the p.m. After 4 days take 3 times a day     glucose blood test strip  Use to check BG once a day to every other day.  Dx:  E11.9     lovastatin 40 MG tablet  Commonly known as:  MEVACOR  TAKE 1 TABLET ONCE A DAY     metFORMIN 500 MG 24 hr tablet  Commonly known as:  GLUCOPHAGE-XR  Take 1 tablet (500 mg total) by mouth daily  with breakfast.     ONETOUCH DELICA LANCETS 99991111 Misc  Use to check BG once a day to every other day Dx: E 11.9           Objective:    BP 131/69 mmHg  Pulse 63  Temp(Src) 97.6 F (36.4 C) (Oral)  Ht 5\' 2"  (1.575 m)  Wt 142 lb 6.4 oz (64.592 kg)  BMI 26.04 kg/m2  Wt Readings from Last 3 Encounters:  10/25/15 142 lb 6.4 oz (64.592 kg)  10/24/15 140 lb 8 oz (63.73 kg)  10/17/15 142 lb 12.8 oz (64.774 kg)    Physical Exam  Constitutional: She is oriented to person, place, and time. She appears well-developed and well-nourished. No distress.  HENT:  Right Ear: External ear normal.  Left Ear: External ear normal.  Nose: Nose normal.  Mouth/Throat: Oropharynx is clear and moist. No oropharyngeal exudate.  Eyes: Conjunctivae and EOM are normal. Pupils are equal, round, and reactive to light.  Neck: Neck supple. No thyromegaly present.  Cardiovascular: Normal rate, regular rhythm, normal heart sounds and intact distal pulses.   No murmur heard. Pulmonary/Chest: Effort normal and breath sounds normal. No respiratory distress. She has no wheezes.  Musculoskeletal: Normal range of motion. She exhibits no edema or tenderness.  Lymphadenopathy:    She has no cervical adenopathy.  Neurological: She is alert and oriented to person, place, and time. Coordination normal.  Skin: Skin is warm and dry. Lesion noted. No rash noted. She is not diaphoretic.     Psychiatric: She has a normal mood and affect. Her behavior is normal.  Nursing note and vitals reviewed.  Cryotherapy of skin lesion on neck: Cryotherapy was performed on the skin lesion on the back of her neck. Patient tolerated well.    Assessment & Plan:   Problem List Items Addressed This Visit      Respiratory   COPD (chronic obstructive pulmonary disease) ([place]) - Primary    Breathing did much better, stable, no issues currently       Other Visit Diagnoses    Actinic keratosis        Lesion on back of neck, treated  with cryotherapy        Follow up plan: Return if symptoms worsen or fail to improve.  Counseling provided for all of the vaccine components No orders of the defined types were placed in this encounter.    Caryl Pina, MD [facility] 10/25/2015, 9:16 AM

## 2015-10-29 ENCOUNTER — Telehealth: Payer: Self-pay | Admitting: Family Medicine

## 2015-10-29 NOTE — Telephone Encounter (Signed)
Called in.

## 2015-11-02 ENCOUNTER — Other Ambulatory Visit: Payer: Self-pay | Admitting: Family Medicine

## 2015-11-14 ENCOUNTER — Ambulatory Visit: Payer: Medicare Other | Admitting: Family Medicine

## 2015-11-20 ENCOUNTER — Telehealth: Payer: Self-pay | Admitting: Family Medicine

## 2015-11-20 MED ORDER — BLOOD GLUCOSE MONITOR KIT: PACK | 0 refills | Status: DC

## 2015-11-20 NOTE — Telephone Encounter (Signed)
Rx faxed to her pharmacy for glucometer and test strips - whichever insurance will cover. Patient called and made aware of changes

## 2015-11-25 ENCOUNTER — Other Ambulatory Visit: Payer: Self-pay | Admitting: Family Medicine

## 2015-11-25 NOTE — Telephone Encounter (Signed)
Last filled 10/29/15

## 2015-11-26 ENCOUNTER — Encounter: Payer: Self-pay | Admitting: Pharmacist

## 2015-11-26 ENCOUNTER — Ambulatory Visit (INDEPENDENT_AMBULATORY_CARE_PROVIDER_SITE_OTHER): Payer: Medicare Other | Admitting: Pharmacist

## 2015-11-26 VITALS — BP 118/60 | HR 72 | Ht 61.5 in | Wt 140.5 lb

## 2015-11-26 DIAGNOSIS — E118 Type 2 diabetes mellitus with unspecified complications: Secondary | ICD-10-CM | POA: Diagnosis not present

## 2015-11-26 DIAGNOSIS — Z0000 Encounter for general adult medical examination without abnormal findings: Secondary | ICD-10-CM | POA: Diagnosis not present

## 2015-11-26 NOTE — Patient Instructions (Addendum)
[name] , Thank you for taking time to come for your Medicare Wellness Visit. I appreciate your ongoing commitment to your health goals. Please review the following plan we discussed and let me know if I can assist you in the future.   These are the goals we discussed:  Continue with your current diet - great job! Continue to stay active - walking daily at home as able.   If you change your mind and want a hearing assessment call our office and we can refer you to a hearing specialist / audiologist  Look for copy of [place] (important to know where these are kept - you can also bring copy to our office to be placed in our file / electronic chart)   This is a list of the screening recommended for you and due dates:  Health Maintenance  Topic Date Due  . Urine Protein Check  10/26/2015 - doing today  . Flu Shot  11/12/2015  . Eye exam for diabetics  2018  . Mammogram   does not get  . Hemoglobin A1C  02/20/2016  . Tetanus Vaccine  04/13/2016  . Complete foot exam   08/27/2016  . DEXA scan (bone density measurement)  11/09/2018  . Shingles Vaccine  Completed  . Pneumonia vaccines  Completed  *Topic was postponed. The date shown is not the original due date.   Fall Prevention in the Home  Falls can cause injuries and can affect people from all age groups. There are many simple things that you can do to make your home safe and to help prevent falls. WHAT CAN I DO ON THE OUTSIDE OF MY HOME?  Regularly repair the edges of walkways and driveways and fix any cracks.  Remove high doorway thresholds.  Trim any shrubbery on the main path into your home.  Use bright outdoor lighting.  Clear walkways of debris and clutter, including tools and rocks.  Regularly check that handrails are securely fastened and in good repair. Both sides of any steps should have handrails.  Install guardrails along the edges of any raised decks  or porches.  Have leaves, snow, and ice cleared regularly.  Use sand or salt on walkways during winter months.  In the garage, clean up any spills right away, including grease or oil spills. WHAT CAN I DO IN THE BATHROOM?  Use night lights.  Install grab bars by the toilet and in the tub and shower. Do not use towel bars as grab bars.  Use non-skid mats or decals on the floor of the tub or shower.  If you need to sit down while you are in the shower, use a plastic, non-slip stool.Marland Kitchen  Keep the floor dry. Immediately clean up any water that spills on the floor.  Remove soap buildup in the tub or shower on a regular basis.  Attach bath mats securely with double-sided non-slip rug tape.  Remove throw rugs and other tripping hazards from the floor. WHAT CAN I DO IN THE BEDROOM?  Use night lights.  Make sure that a bedside light is easy to reach.  Do not use oversized bedding that drapes onto the floor.  Have a firm chair that has side arms to use for getting dressed.  Remove throw rugs and other tripping hazards from the floor. WHAT CAN I DO IN THE KITCHEN?   Clean up any spills right away.  Avoid walking on wet floors.  Place frequently  used items in easy-to-reach places.  If you need to reach for something above you, use a sturdy step stool that has a grab bar.  Keep electrical cables out of the way.  Do not use floor polish or wax that makes floors slippery. If you have to use wax, make sure that it is non-skid floor wax.  Remove throw rugs and other tripping hazards from the floor. WHAT CAN I DO IN THE STAIRWAYS?  Do not leave any items on the stairs.  Make sure that there are handrails on both sides of the stairs. Fix handrails that are broken or loose. Make sure that handrails are as long as the stairways.  Check any carpeting to make sure that it is firmly attached to the stairs. Fix any carpet that is loose or worn.  Avoid having throw rugs at the top or  bottom of stairways, or secure the rugs with carpet tape to prevent them from moving.  Make sure that you have a light switch at the top of the stairs and the bottom of the stairs. If you do not have them, have them installed. WHAT ARE SOME OTHER FALL PREVENTION TIPS?  Wear closed-toe shoes that fit well and support your feet. Wear shoes that have rubber soles or low heels.  When you use a stepladder, make sure that it is completely opened and that the sides are firmly locked. Have someone hold the ladder while you are using it. Do not climb a closed stepladder.  Add color or contrast paint or tape to grab bars and handrails in your home. Place contrasting color strips on the first and last steps.  Use mobility aids as needed, such as canes, walkers, scooters, and crutches.  Turn on lights if it is dark. Replace any light bulbs that burn out.  Set up furniture so that there are clear paths. Keep the furniture in the same spot.  Fix any uneven floor surfaces.  Choose a carpet design that does not hide the edge of steps of a stairway.  Be aware of any and all pets.  Review your medicines with your healthcare provider. Some medicines can cause dizziness or changes in blood pressure, which increase your risk of falling. Talk with your health care provider about other ways that you can decrease your risk of falls. This may include working with a physical therapist or trainer to improve your strength, balance, and endurance.   This information is not intended to replace advice given to you by your health care provider. Make sure you discuss any questions you have with your health care provider.   Document Released: 03/20/2002 Document Revised: 08/14/2014 Document Reviewed: 05/04/2014 Elsevier Interactive Patient Education [employer].

## 2015-11-26 NOTE — Progress Notes (Signed)
Patient ID: [name] [name], female   DOB: [birth year], 82 y.o.   MRN: [id number]    Subjective:   [name] [name] is a 82 y.o. female who presents for a subsequent Medicare Annual Wellness Visit.  [name] [name] lost her husband April 2017.  She is adjusting fairly well and has great support from her children.  I last saw her about 1 month ago when we reviewed CHO counting and a diabetic diet.   Diagnosed type 2 DM - 08/2014.  Last A1c = 6.4% (08/20/2015) Current monitoring regimen: checks once daily Home blood sugar records: BG range from 77 to 149  Review of Systems  Constitutional: Negative.   HENT: Positive for hearing loss.   Eyes: Negative.   Respiratory: Negative.   Cardiovascular: Negative.   Gastrointestinal: Negative.   Genitourinary: Negative.   Musculoskeletal: Negative.   Skin: Negative.   Neurological: Positive for tremors.  Endo/Heme/Allergies: Bruises/bleeds easily (has improved since started ASA 1m every other day instead of daily).  Psychiatric/Behavioral: Positive for depression. The patient is nervous/anxious.      Current Medications (verified) Outpatient Encounter Prescriptions as of 11/26/2015  Medication Sig  . albuterol (PROVENTIL) (2.5 MG/3ML) 0.083% nebulizer solution Take 3 mLs (2.5 mg total) by nebulization every 6 (six) hours as needed for wheezing or shortness of breath.  . ALPRAZolam (XANAX) 0.25 MG tablet TAKE 1/2 TABLET FOUR TIMES DAILY AS NEEDED  . amLODipine (NORVASC) 2.5 MG tablet Take 1 tablet (2.5 mg total) by mouth daily.  .Marland Kitchenaspirin 81 MG tablet Take 81 mg by mouth every other day.  . blood glucose meter kit and supplies KIT Dispense based on patient and insurance preference. Use to check BG once daily.  (Dx: type 2 DM, E11.9)  . budesonide-formoterol (SYMBICORT) 160-4.5 MCG/ACT inhaler Inhale 2 puffs into the lungs 2 (two) times daily.  . calcium carbonate (OS-CAL) 600 MG TABS tablet Take 600 mg by mouth 2 (two) times daily with a meal.  .  cholecalciferol (VITAMIN D) 1000 UNITS tablet Take 2,000 Units by mouth daily.  .Marland Kitchenescitalopram (LEXAPRO) 10 MG tablet TAKE ONE TABLET BY MOUTH EVERY DAY AS DIRECTED  . fluticasone (FLONASE) 50 MCG/ACT nasal spray Place 1 spray into both nostrils daily.  .Marland Kitchengabapentin (NEURONTIN) 100 MG capsule Begin 1 tablet at bedtime. After 4 days and 1 tablet in the p.m. After 4 days take 3 times a day  . glucose blood test strip Use to check BG once a day to every other day.  Dx:  E11.9  . lovastatin (MEVACOR) 40 MG tablet TAKE 1 TABLET ONCE A DAY  . metFORMIN (GLUCOPHAGE-XR) 500 MG 24 hr tablet Take 1 tablet (500 mg total) by mouth daily with breakfast.  . ONETOUCH DELICA LANCETS 399BMISC Use to check BG once a day to every other day Dx: E 11.9   No facility-administered encounter medications on file as of 11/26/2015.     Allergies (verified) Lisinopril; Chlordiazepoxide-clidinium; Clarithromycin; Erymax [erythromycin]; Evista [raloxifene hydrochloride]; Penicillins; Sertraline hcl; Sulfa antibiotics; Betadine [povidone iodine]; and Hydrogen peroxide   History: Past Medical History:  Diagnosis Date  . Anxiety   . Arthritis   . Atrophic vaginitis   . Cataract   . COPD (chronic obstructive pulmonary disease) ([place]   . Depressive disorder, not elsewhere classified   . Diabetes mellitus without complication ([place]   . Disorder of bone and cartilage, unspecified   . Elevated blood sugar   . Emphysema lung ([place]   . Hypertension   .  Other and unspecified hyperlipidemia   . Postmenopausal   . Rhinitis    Past Surgical History:  Procedure Laterality Date  . APPENDECTOMY  1957  . CATARACT EXTRACTION W/ INTRAOCULAR LENS IMPLANT Bilateral 2005   [place]  . COLONOSCOPY N/A 03/08/2014   Procedure: COLONOSCOPY;  Surgeon: Rogene Houston, MD;  [facility];  Service: Endoscopy;  Laterality: N/A;  . EYE SURGERY    . TOTAL HIP ARTHROPLASTY Left 12/27/2013   Procedure: LEFT TOTAL HIP ARTHROPLASTY;   Surgeon: Newt Minion, MD;  Location: [facility];  Service: Orthopedics;  Laterality: Left;   Family History  Problem Relation Age of Onset  . Arthritis Mother   . COPD Father   . Diabetes Father   . Aneurysm Sister   . Diabetes Sister   . Diabetes Sister   . Hypertension Sister    Social History   Occupational History  . Not on file.   Social History Main Topics  . Smoking status: Never Smoker  . Smokeless tobacco: Never Used  . Alcohol use No  . Drug use: No  . Sexual activity: No    Do you feel safe at home?  Yes  Dietary issues and exercise activities: Current Exercise Habits: Home exercise routine, Type of exercise: walking, Time (Minutes): 10, Frequency (Times/Week): 4, Weekly Exercise (Minutes/Week): 40, Intensity: Mild   Current Dietary habits:  Patient is following low carbohydrate.  Breakfast is usually oatmeal with fruit or cereal with fruits Lunch - tomato sandwich or salad.  Sometimes vegetables.  She reports keeping serving sizes to 1/2 cup if she has starch like potatoes, beans or rice.  Supper - similar to lunch Beverages - no soda or sweet tea - only water or coffee with milk   Objective:    Today's Vitals   11/26/15 1002  BP: 118/60  Pulse: 72  Weight: 140 lb 8 oz (63.7 kg)  Height: 5' 1.5" (1.562 m)  PainSc: 0-No pain   Body mass index is 26.12 kg/m.  Activities of Daily Living In your present state of health, do you have any difficulty performing the following activities: 11/26/2015 10/08/2015  Hearing? Y N  Vision? N N  Difficulty concentrating or making decisions? N N  Walking or climbing stairs? N Y  Dressing or bathing? N N  Doing errands, shopping? N Y  Conservation officer, nature and eating ? N -  Using the Toilet? N -  In the past six months, have you accidently leaked urine? N -  Do you have problems with loss of bowel control? N -  Managing your Medications? N -  Managing your Finances? N -  Housekeeping or managing your Housekeeping? N -    Some recent data might be hidden    Are there smokers in your home (other than you)? No Cardiac Risk Factors include: advanced age (>36mn, >>58women);diabetes mellitus;dyslipidemia;hypertension  Depression Screen PHQ 2/9 Scores 11/26/2015 10/25/2015 10/17/2015 10/08/2015  PHQ - 2 Score 2 0 0 1  PHQ- 9 Score 2 - - -    Fall Risk Fall Risk  11/26/2015 10/25/2015 10/17/2015 10/08/2015 09/26/2015  Falls in the past year? Yes Yes Yes No No  Number falls in past yr: 2 or more 1 1 - -  Injury with Fall? No Yes Yes - -  Risk Factor Category  High Fall Risk - - - -  Risk for fall due to : - History of fall(s);Impaired balance/gait;Impaired mobility - - -  Follow up Falls prevention  discussed - - - -    Cognitive Function:  MMSE - Mini Mental State Exam 11/26/2015 10/26/2014  Not completed: - Refused  Orientation to time 5 -  Orientation to Place 5 -  Registration 3 3  Attention/ Calculation 4 -  Recall 2 3  Language- name 2 objects 2 -  Language- repeat 1 -  Language- follow 3 step command 3 -  Language- read & follow direction 1 -  Write a sentence 1 -  Copy design 0 -  Total score 27 -    Immunizations and Health Maintenance Immunization History  Administered Date(s) Administered  . Influenza Whole 12/12/2009  . Influenza,inj,Quad PF,36+ Mos 02/01/2013, 12/29/2013, 01/10/2015  . Pneumococcal Conjugate-13 04/24/2013  . Pneumococcal Polysaccharide-23 01/12/2003  . Td 04/13/2006  . Zoster 03/13/2006   Health Maintenance Due  Topic Date Due  . URINE MICROALBUMIN  10/26/2015  . INFLUENZA VACCINE  11/12/2015    Patient Care Team: Chipper Herb, MD as PCP - General (Family Medicine) Newt Minion, MD (Orthopedic Surgery) Clent Jacks, MD (Ophthalmology) Kathee Delton, MD as Consulting Physician (Pulmonary Disease)  Indicate any recent Medical Services you may have received from other than [facility] providers in the past year (date may be approximate).    Assessment:    Annual  Wellness Visit  Type 2 DM - controlled   Screening Tests Health Maintenance  Topic Date Due  . URINE MICROALBUMIN  10/26/2015  . INFLUENZA VACCINE  11/12/2015  . OPHTHALMOLOGY EXAM  02/28/2016 (Originally 04/13/2010)  . MAMMOGRAM  05/30/2016 (Originally 02/11/2011)  . HEMOGLOBIN A1C  02/20/2016  . TETANUS/TDAP  04/13/2016  . FOOT EXAM  08/27/2016  . DEXA SCAN  11/09/2018  . ZOSTAVAX  Completed  . PNA vac Low Risk Adult  Completed        Plan:   During the course of the visit [name] [name] was educated and counseled about the following appropriate screening and preventive services:   Vaccines to include Pneumoccal, Influenza, Td, Zostavax - UTD, reminded to get infulenza vaccine next month  Colorectal cancer screening - colonoscopy and FOBT UTD  Cardiovascular disease screening - ECG is UTD  Diabetes - continue metformin and diet / exercise  Bone Denisty / Osteoporosis Screening - UTD  Mammogram - patient refuses  Glaucoma screening / Diabetic Eye Exam - UTD, will request records from Dr Katy Fitch  Nutrition counseling - Continue with current CHO counting diet  Advanced Directives - patient declined information but has discussed her wishes with her family  Physical activity - continue to walk daily  Declined referral to audiologist for hearing assessment   Orders Placed This Encounter  Procedures  . Microalbumin / creatinine urine ratio     Patient Instructions (the written plan) were given to the patient.   Cherre Robins, PharmD   11/26/2015

## 2015-11-27 LAB — MICROALBUMIN / CREATININE URINE RATIO
Creatinine, Urine: 65.2 mg/dL
MICROALB/CREAT RATIO: 4.9 mg/g creat (ref 0.0–30.0)
MICROALBUM., U, RANDOM: 3.2 ug/mL

## 2015-12-03 ENCOUNTER — Other Ambulatory Visit: Payer: Self-pay | Admitting: Family Medicine

## 2015-12-11 ENCOUNTER — Telehealth: Payer: Self-pay | Admitting: Family Medicine

## 2015-12-11 MED ORDER — BLOOD GLUCOSE MONITOR KIT: PACK | 0 refills | Status: DC

## 2015-12-11 NOTE — Telephone Encounter (Signed)
Pt aware glucose meter sent to [facility] per her insurance

## 2015-12-12 ENCOUNTER — Other Ambulatory Visit: Payer: Self-pay | Admitting: *Deleted

## 2015-12-12 MED ORDER — BLOOD GLUCOSE MONITOR KIT: PACK | 0 refills | Status: DC

## 2015-12-12 MED ORDER — ONETOUCH DELICA LANCETS 33G MISC: 3 refills | Status: DC

## 2015-12-12 MED ORDER — GLUCOSE BLOOD VI STRP: ORAL_STRIP | 3 refills | Status: DC

## 2015-12-18 ENCOUNTER — Other Ambulatory Visit: Payer: Medicare Other

## 2015-12-18 DIAGNOSIS — E559 Vitamin D deficiency, unspecified: Secondary | ICD-10-CM | POA: Diagnosis not present

## 2015-12-18 DIAGNOSIS — I10 Essential (primary) hypertension: Secondary | ICD-10-CM | POA: Diagnosis not present

## 2015-12-18 DIAGNOSIS — E785 Hyperlipidemia, unspecified: Secondary | ICD-10-CM | POA: Diagnosis not present

## 2015-12-18 DIAGNOSIS — E118 Type 2 diabetes mellitus with unspecified complications: Secondary | ICD-10-CM

## 2015-12-18 LAB — BAYER DCA HB A1C WAIVED: HB A1C (BAYER DCA - WAIVED): 6 % (ref <7.0)

## 2015-12-19 LAB — CBC WITH DIFFERENTIAL/PLATELET
BASOS ABS: 0 10*3/uL (ref 0.0–0.2)
Basos: 0 %
EOS (ABSOLUTE): 0.1 10*3/uL (ref 0.0–0.4)
EOS: 1 %
HEMATOCRIT: 43.9 % (ref 34.0–46.6)
Hemoglobin: 14.6 g/dL (ref 11.1–15.9)
IMMATURE GRANULOCYTES: 0 %
Immature Grans (Abs): 0 10*3/uL (ref 0.0–0.1)
LYMPHS ABS: 1.6 10*3/uL (ref 0.7–3.1)
Lymphs: 28 %
MCH: 30.7 pg (ref 26.6–33.0)
MCHC: 33.3 g/dL (ref 31.5–35.7)
MCV: 92 fL (ref 79–97)
MONOCYTES: 7 %
MONOS ABS: 0.4 10*3/uL (ref 0.1–0.9)
NEUTROS PCT: 64 %
Neutrophils Absolute: 3.6 10*3/uL (ref 1.4–7.0)
Platelets: 257 10*3/uL (ref 150–379)
RBC: 4.76 x10E6/uL (ref 3.77–5.28)
RDW: 14 % (ref 12.3–15.4)
WBC: 5.6 10*3/uL (ref 3.4–10.8)

## 2015-12-19 LAB — BMP8+EGFR
BUN/Creatinine Ratio: 15 (ref 12–28)
BUN: 11 mg/dL (ref 8–27)
CHLORIDE: 98 mmol/L (ref 96–106)
CO2: 25 mmol/L (ref 18–29)
CREATININE: 0.74 mg/dL (ref 0.57–1.00)
Calcium: 9.7 mg/dL (ref 8.7–10.3)
GFR calc Af Amer: 88 mL/min/{1.73_m2} (ref >59)
GFR calc non Af Amer: 76 mL/min/{1.73_m2} (ref >59)
GLUCOSE: 106 mg/dL — AB (ref 65–99)
Potassium: 4.6 mmol/L (ref 3.5–5.2)
SODIUM: 138 mmol/L (ref 134–144)

## 2015-12-19 LAB — NMR, LIPOPROFILE
CHOLESTEROL: 132 mg/dL (ref 100–199)
HDL CHOLESTEROL BY NMR: 53 mg/dL (ref >39)
HDL PARTICLE NUMBER: 30.7 umol/L (ref >=30.5)
LDL Particle Number: 794 nmol/L (ref <1000)
LDL Size: 20.5 nm (ref >20.5)
LDL-C: 65 mg/dL (ref 0–99)
LP-IR Score: <25 (ref <=45)
SMALL LDL PARTICLE NUMBER: 364 nmol/L (ref <=527)
TRIGLYCERIDES BY NMR: 71 mg/dL (ref 0–149)

## 2015-12-19 LAB — HEPATIC FUNCTION PANEL
ALBUMIN: 4.5 g/dL (ref 3.5–4.7)
ALT: 11 IU/L (ref 0–32)
AST: 20 IU/L (ref 0–40)
Alkaline Phosphatase: 81 IU/L (ref 39–117)
BILIRUBIN TOTAL: 0.8 mg/dL (ref 0.0–1.2)
Bilirubin, Direct: 0.22 mg/dL (ref 0.00–0.40)
TOTAL PROTEIN: 6.9 g/dL (ref 6.0–8.5)

## 2015-12-19 LAB — VITAMIN D 25 HYDROXY (VIT D DEFICIENCY, FRACTURES): Vit D, 25-Hydroxy: 49.8 ng/mL (ref 30.0–100.0)

## 2015-12-24 ENCOUNTER — Other Ambulatory Visit: Payer: Self-pay | Admitting: *Deleted

## 2015-12-24 MED ORDER — ALPRAZOLAM 0.25 MG PO TABS: ORAL_TABLET | ORAL | 1 refills | Status: DC

## 2016-01-01 ENCOUNTER — Ambulatory Visit (INDEPENDENT_AMBULATORY_CARE_PROVIDER_SITE_OTHER): Payer: Medicare Other | Admitting: Family Medicine

## 2016-01-01 ENCOUNTER — Encounter: Payer: Self-pay | Admitting: Family Medicine

## 2016-01-01 VITALS — BP 107/52 | HR 63 | Temp 97.9°F | Ht 61.5 in | Wt 138.0 lb

## 2016-01-01 DIAGNOSIS — I10 Essential (primary) hypertension: Secondary | ICD-10-CM

## 2016-01-01 DIAGNOSIS — E559 Vitamin D deficiency, unspecified: Secondary | ICD-10-CM

## 2016-01-01 DIAGNOSIS — E785 Hyperlipidemia, unspecified: Secondary | ICD-10-CM | POA: Diagnosis not present

## 2016-01-01 DIAGNOSIS — E118 Type 2 diabetes mellitus with unspecified complications: Secondary | ICD-10-CM | POA: Diagnosis not present

## 2016-01-01 DIAGNOSIS — J42 Unspecified chronic bronchitis: Secondary | ICD-10-CM | POA: Diagnosis not present

## 2016-01-01 DIAGNOSIS — Z23 Encounter for immunization: Secondary | ICD-10-CM | POA: Diagnosis not present

## 2016-01-01 DIAGNOSIS — R1030 Lower abdominal pain, unspecified: Secondary | ICD-10-CM

## 2016-01-01 DIAGNOSIS — J449 Chronic obstructive pulmonary disease, unspecified: Secondary | ICD-10-CM | POA: Diagnosis not present

## 2016-01-01 DIAGNOSIS — N390 Urinary tract infection, site not specified: Secondary | ICD-10-CM

## 2016-01-01 DIAGNOSIS — R1032 Left lower quadrant pain: Secondary | ICD-10-CM

## 2016-01-01 DIAGNOSIS — M25552 Pain in left hip: Secondary | ICD-10-CM | POA: Diagnosis not present

## 2016-01-01 LAB — URINALYSIS, COMPLETE
Bilirubin, UA: NEGATIVE
GLUCOSE, UA: NEGATIVE
KETONES UA: NEGATIVE
LEUKOCYTES UA: NEGATIVE
NITRITE UA: NEGATIVE
PROTEIN UA: NEGATIVE
RBC UA: NEGATIVE
Specific Gravity, UA: 1.02 (ref 1.005–1.030)
Urobilinogen, Ur: 0.2 mg/dL (ref 0.2–1.0)
pH, UA: 6.5 (ref 5.0–7.5)

## 2016-01-01 LAB — MICROSCOPIC EXAMINATION

## 2016-01-01 NOTE — Patient Instructions (Addendum)
Medicare Annual Wellness Visit  [facility] and the medical providers at [facility] strive to bring you the best medical care.  In doing so we not only want to address your current medical conditions and concerns but also to detect new conditions early and prevent illness, disease and health-related problems.    Medicare offers a yearly Wellness Visit which allows our clinical staff to assess your need for preventative services including immunizations, lifestyle education, counseling to decrease risk of preventable diseases and screening for fall risk and other medical concerns.    This visit is provided free of charge (no copay) for all Medicare recipients. The clinical pharmacists at [facility] have begun to conduct these Wellness Visits which will also include a thorough review of all your medications.    As you primary medical provider recommend that you make an appointment for your Annual Wellness Visit if you have not done so already this year.  You may set up this appointment before you leave today or you may call back [phone]) and schedule an appointment.  Please make sure when you call that you mention that you are scheduling your Annual Wellness Visit with the clinical pharmacist so that the appointment may be made for the proper length of time.     Continue current medications. Continue good therapeutic lifestyle changes which include good diet and exercise. Fall precautions discussed with patient. If an FOBT was given today- please return it to our front desk. If you are over 33 years old - you may need Prevnar 14 or the adult Pneumonia vaccine.  **Flu shots are available--- please call and schedule a FLU-CLINIC appointment**  After your visit with [place] today you will receive a survey in the mail or online from [employer] regarding your care with [place]. Please take a moment to fill this out. Your feedback is very  important to [place] as you can help [place] better understand your patient needs as well as improve your experience and satisfaction. WE CARE ABOUT YOU!!!   The patient had all her lab work reviewed with her today and she was given a copy of this and she is doing well and all categories. She should continue to follow her diet and take her medication as she is currently doing She should be careful not put yourself at risk for falling and not be in a hurry

## 2016-01-01 NOTE — Progress Notes (Signed)
Subjective:    Patient ID: [name] [name], female    DOB: [birth year], 82 y.o.   MRN: [id number]  HPI Pt here for follow up and management of chronic medical problems which includes diabetes, and hyperlipidemia. She is taking medications regularly.The patient is pleasant and doing well. She's been supported by her 2 daughters who have helped her with her health issues since her husband passed away. She is very positive about what she is doing and following her diet closely. She has lost some weight and we attributed the weight loss more to the fact that she's been watching her diet more closely. She does complain of some soreness and she has a bruise near her left buttock area and this is where she is been hurting. She denies any fall or injury. We will continue to monitor this. She denies any chest pain or shortness of breath. She denies any trouble with heartburn indigestion nausea vomiting diarrhea or blood in the stool. She denies any abdominal pain. She denies any problems with passing her water. We will continue to monitor the weight issue. She is trying to eat healthy. We will get a urinalysis today and she will get a DEXA scan today. She will also receive her flu vaccine.    Patient Active Problem List   Diagnosis Date Noted  . Type 2 diabetes mellitus with hyperlipidemia ([facility]) 09/26/2015  . COPD (chronic obstructive pulmonary disease) ([facility]) 05/29/2015  . Type 2 diabetes mellitus, controlled ([place]) 08/30/2014  . Rectal bleeding 03/07/2014  . BRBPR (bright red blood per rectum) 03/07/2014  . History of total left hip arthroplasty 01/25/2014  . Elevated liver enzymes 01/13/2014  . Edema 01/02/2014  . Hypoxia 01/02/2014  . S/P total hip arthroplasty 12/27/2013  . Chronic cough 10/26/2013  . Hyperlipidemia   . Anxiety   . Depressive disorder, not elsewhere classified   . Rhinitis   . Atrophic vaginitis   . Postmenopausal    Outpatient Encounter Prescriptions as of 01/01/2016    Medication Sig  . albuterol (PROVENTIL) (2.5 MG/3ML) 0.083% nebulizer solution Take 3 mLs (2.5 mg total) by nebulization every 6 (six) hours as needed for wheezing or shortness of breath.  . ALPRAZolam (XANAX) 0.25 MG tablet TAKE 1/2 TABLET FOUR TIMES DAILY AS NEEDED  . amLODipine (NORVASC) 2.5 MG tablet Take 1 tablet (2.5 mg total) by mouth daily.  Marland Kitchen aspirin 81 MG tablet Take 81 mg by mouth every other day.  . blood glucose meter kit and supplies KIT Check BS once daily  . budesonide-formoterol (SYMBICORT) 160-4.5 MCG/ACT inhaler Inhale 2 puffs into the lungs 2 (two) times daily.  . calcium carbonate (OS-CAL) 600 MG TABS tablet Take 600 mg by mouth 2 (two) times daily with a meal.  . cholecalciferol (VITAMIN D) 1000 UNITS tablet Take 2,000 Units by mouth daily.  Marland Kitchen escitalopram (LEXAPRO) 10 MG tablet TAKE 1 TABLET DAILY AS DIRECTED  . fluticasone (FLONASE) 50 MCG/ACT nasal spray Place 1 spray into both nostrils daily.  Marland Kitchen gabapentin (NEURONTIN) 100 MG capsule Begin 1 tablet at bedtime. After 4 days and 1 tablet in the p.m. After 4 days take 3 times a day  . glucose blood test strip Use to check BG once a day to every other day.  Dx:  E11.9  . lovastatin (MEVACOR) 40 MG tablet TAKE 1 TABLET ONCE A DAY  . metFORMIN (GLUCOPHAGE-XR) 500 MG 24 hr tablet Take 1 tablet (500 mg total) by mouth daily with breakfast.  . Glory Rosebush  DELICA LANCETS 56O MISC Use to check BG once a day to every other day Dx: E 11.9   No facility-administered encounter medications on file as of 01/01/2016.        Review of Systems  Constitutional: Negative.   HENT: Negative.   Eyes: Negative.   Respiratory: Negative.   Cardiovascular: Negative.   Gastrointestinal: Negative.   Endocrine: Negative.   Genitourinary: Positive for flank pain (left side with some left groin area pain).  Skin: Negative.   Allergic/Immunologic: Negative.   Neurological: Negative.   Hematological: Negative.   Psychiatric/Behavioral:  Negative.        Objective:   Physical Exam  Constitutional: She is oriented to person, place, and time. No distress.  Small framed but calm and doing well with her diet and managing her diabetes.  HENT:  Head: Normocephalic and atraumatic.  Right Ear: External ear normal.  Left Ear: External ear normal.  Nose: Nose normal.  Mouth/Throat: Oropharynx is clear and moist.  Eyes: Conjunctivae and EOM are normal. Pupils are equal, round, and reactive to light. Right eye exhibits no discharge. Left eye exhibits no discharge. No scleral icterus.  Neck: Normal range of motion. Neck supple. No thyromegaly present.  No bruits or thyromegaly  Cardiovascular: Normal rate, regular rhythm, normal heart sounds and intact distal pulses.   No murmur heard. The heart is 60/m with a regular rate and rhythm  Pulmonary/Chest: Effort normal and breath sounds normal. No respiratory distress. She has no wheezes. She has no rales. She exhibits no tenderness.  Clear anteriorly and posteriorly and slightly tight with coughing.  Abdominal: Soft. Bowel sounds are normal. She exhibits no mass. There is no tenderness. There is no rebound and no guarding.  No liver or spleen enlargement and no masses with normal bowel sounds and no bruits  Musculoskeletal: Normal range of motion. She exhibits no edema.  Lymphadenopathy:    She has no cervical adenopathy.  Neurological: She is alert and oriented to person, place, and time. She has normal reflexes. No cranial nerve deficit.  Skin: Skin is warm and dry. No rash noted.  Psychiatric: She has a normal mood and affect. Her behavior is normal. Judgment and thought content normal.  Nursing note and vitals reviewed.  BP (!) 107/52 (BP Location: Left Arm)   Pulse 63   Temp 97.9 F (36.6 C) (Oral)   Ht 5' 1.5" (1.562 m)   Wt 138 lb (62.6 kg)   BMI 25.65 kg/m         Assessment & Plan:  1. Controlled type 2 diabetes mellitus with complication, without long-term  current use of insulin (HCC) -The hemoglobin A1c was excellent at 6.0%. She will continue with current treatment and therapeutic lifestyle changes  2. Hyperlipidemia -All of her cholesterol numbers were excellent and she will continue with current treatment and therapeutic lifestyle changes  3. Essential hypertension -The blood pressure is good and she will continue with current treatment  4. Vitamin D deficiency -Vitamin D level was good and she will continue with current treatment  5. Encounter for immunization - Flu vaccine HIGH DOSE PF  6. Chronic bronchitis, unspecified chronic bronchitis type ([place]) -Continue with breathing treatment and respiratory protection  7. Chronic obstructive pulmonary disease, unspecified COPD type ([place]) -Continue with current breathing treatments and respiratory protection  8. Left hip pain -Take Tylenol as needed for pain  Patient Instructions  Medicare Annual Wellness Visit  [facility] and the medical providers at [facility] strive to bring you the best medical care.  In doing so we not only want to address your current medical conditions and concerns but also to detect new conditions early and prevent illness, disease and health-related problems.    Medicare offers a yearly Wellness Visit which allows our clinical staff to assess your need for preventative services including immunizations, lifestyle education, counseling to decrease risk of preventable diseases and screening for fall risk and other medical concerns.    This visit is provided free of charge (no copay) for all Medicare recipients. The clinical pharmacists at [facility] have begun to conduct these Wellness Visits which will also include a thorough review of all your medications.    As you primary medical provider recommend that you make an appointment for your Annual Wellness Visit if you have not done so already  this year.  You may set up this appointment before you leave today or you may call back ([phone]) and schedule an appointment.  Please make sure when you call that you mention that you are scheduling your Annual Wellness Visit with the clinical pharmacist so that the appointment may be made for the proper length of time.     Continue current medications. Continue good therapeutic lifestyle changes which include good diet and exercise. Fall precautions discussed with patient. If an FOBT was given today- please return it to our front desk. If you are over 50 years old - you may need Prevnar 15 or the adult Pneumonia vaccine.  **Flu shots are available--- please call and schedule a FLU-CLINIC appointment**  After your visit with [place] today you will receive a survey in the mail or online from [employer] regarding your care with [place]. Please take a moment to fill this out. Your feedback is very important to [place] as you can help [place] better understand your patient needs as well as improve your experience and satisfaction. WE CARE ABOUT YOU!!!   The patient had all her lab work reviewed with her today and she was given a copy of this and she is doing well and all categories. She should continue to follow her diet and take her medication as she is currently doing She should be careful not put yourself at risk for falling and not be in a hurry    Arrie Senate MD

## 2016-01-01 NOTE — Addendum Note (Signed)
Addended by: Zannie Cove on: 01/01/2016 04:30 PM   Modules accepted: Orders

## 2016-01-03 LAB — URINE CULTURE

## 2016-01-06 ENCOUNTER — Telehealth: Payer: Self-pay | Admitting: Family Medicine

## 2016-01-06 NOTE — Telephone Encounter (Signed)
Tried to call back - No Ans/ LM on VM

## 2016-01-07 NOTE — Telephone Encounter (Signed)
Patient wanted to know  What type of cereal she could eat - Discussed choosing highfiber and low sugar cereals.  Cheeries, special K, bran flakes.

## 2016-01-08 MED ORDER — CIPROFLOXACIN HCL 500 MG PO TABS: 500.0000 mg | ORAL_TABLET | Freq: Two times a day (BID) | ORAL | 0 refills | Status: DC

## 2016-01-08 NOTE — Addendum Note (Signed)
Addended by: Thana Ates on: 01/08/2016 04:23 PM   Modules accepted: Orders

## 2016-01-14 ENCOUNTER — Other Ambulatory Visit: Payer: Self-pay | Admitting: *Deleted

## 2016-01-14 MED ORDER — GLUCOSE BLOOD VI STRP: ORAL_STRIP | 3 refills | Status: DC

## 2016-01-16 ENCOUNTER — Other Ambulatory Visit (INDEPENDENT_AMBULATORY_CARE_PROVIDER_SITE_OTHER): Payer: Medicare Other

## 2016-01-16 DIAGNOSIS — N390 Urinary tract infection, site not specified: Secondary | ICD-10-CM

## 2016-01-17 ENCOUNTER — Telehealth: Payer: Self-pay | Admitting: Family Medicine

## 2016-01-17 MED ORDER — GLUCOSE BLOOD VI STRP: ORAL_STRIP | 3 refills | Status: DC

## 2016-01-18 LAB — URINE CULTURE

## 2016-01-20 ENCOUNTER — Other Ambulatory Visit: Payer: Self-pay | Admitting: *Deleted

## 2016-01-20 MED ORDER — CEFDINIR 300 MG PO CAPS: 300.0000 mg | ORAL_CAPSULE | Freq: Two times a day (BID) | ORAL | 0 refills | Status: DC

## 2016-01-27 ENCOUNTER — Other Ambulatory Visit: Payer: Self-pay

## 2016-01-27 MED ORDER — GLUCOSE BLOOD VI STRP: ORAL_STRIP | 2 refills | Status: DC

## 2016-01-31 ENCOUNTER — Other Ambulatory Visit (INDEPENDENT_AMBULATORY_CARE_PROVIDER_SITE_OTHER): Payer: Medicare Other

## 2016-01-31 DIAGNOSIS — Z87440 Personal history of urinary (tract) infections: Secondary | ICD-10-CM | POA: Diagnosis not present

## 2016-01-31 DIAGNOSIS — E119 Type 2 diabetes mellitus without complications: Secondary | ICD-10-CM | POA: Diagnosis not present

## 2016-01-31 LAB — MICROSCOPIC EXAMINATION

## 2016-01-31 LAB — URINALYSIS, COMPLETE
Bilirubin, UA: NEGATIVE
Glucose, UA: NEGATIVE
KETONES UA: NEGATIVE
LEUKOCYTES UA: NEGATIVE
Nitrite, UA: NEGATIVE
PROTEIN UA: NEGATIVE
RBC, UA: NEGATIVE
SPEC GRAV UA: 1.015 (ref 1.005–1.030)
Urobilinogen, Ur: 0.2 mg/dL (ref 0.2–1.0)
pH, UA: 7 (ref 5.0–7.5)

## 2016-02-03 LAB — URINE CULTURE

## 2016-02-04 ENCOUNTER — Other Ambulatory Visit: Payer: Self-pay | Admitting: Family Medicine

## 2016-02-05 ENCOUNTER — Other Ambulatory Visit: Payer: Self-pay | Admitting: Family Medicine

## 2016-02-07 ENCOUNTER — Telehealth: Payer: Self-pay | Admitting: *Deleted

## 2016-02-07 NOTE — Telephone Encounter (Signed)
Reviewed urine culture with patient.  She has no signs or syptoms of bladder problems at this time.  She will call back to speak with Dr. Laurance Flatten if anything new begins.  She does not want to consider a urologist due to lack of transportation.   Advised [facility] was close by and she could request a ride through [place] but she declares her dislike for that hospital.

## 2016-02-25 ENCOUNTER — Other Ambulatory Visit: Payer: Self-pay | Admitting: Family Medicine

## 2016-02-25 NOTE — Telephone Encounter (Signed)
Per Dr Sabra Heck xanax ok x 1 RX called into [facility] Pt notified

## 2016-02-25 NOTE — Telephone Encounter (Signed)
Pt requesting refill of Xanax Will run out today Please review and advise

## 2016-03-06 ENCOUNTER — Other Ambulatory Visit: Payer: Self-pay | Admitting: Family Medicine

## 2016-03-09 ENCOUNTER — Ambulatory Visit (INDEPENDENT_AMBULATORY_CARE_PROVIDER_SITE_OTHER): Payer: Medicare Other | Admitting: Family

## 2016-03-09 ENCOUNTER — Encounter: Payer: Self-pay | Admitting: Family

## 2016-03-09 VITALS — BP 115/68 | HR 58 | Temp 96.6°F | Ht 61.5 in | Wt 134.6 lb

## 2016-03-09 DIAGNOSIS — J069 Acute upper respiratory infection, unspecified: Secondary | ICD-10-CM | POA: Diagnosis not present

## 2016-03-09 MED ORDER — BENZONATATE 200 MG PO CAPS: 200.0000 mg | ORAL_CAPSULE | Freq: Three times a day (TID) | ORAL | 1 refills | Status: DC | PRN

## 2016-03-09 MED ORDER — FLUTICASONE PROPIONATE 50 MCG/ACT NA SUSP: NASAL | 4 refills | Status: DC

## 2016-03-09 NOTE — Patient Instructions (Signed)
Upper Respiratory Infection, Adult Most upper respiratory infections (URIs) are a viral infection of the air passages leading to the lungs. A URI affects the nose, throat, and upper air passages. The most common type of URI is nasopharyngitis and is typically referred to as "the common cold." URIs run their course and usually go away on their own. Most of the time, a URI does not require medical attention, but sometimes a bacterial infection in the upper airways can follow a viral infection. This is called a secondary infection. Sinus and middle ear infections are common types of secondary upper respiratory infections. Bacterial pneumonia can also complicate a URI. A URI can worsen asthma and chronic obstructive pulmonary disease (COPD). Sometimes, these complications can require emergency medical care and may be life threatening. What are the causes? Almost all URIs are caused by viruses. A virus is a type of germ and can spread from one person to another. What increases the risk? You may be at risk for a URI if:  You smoke.  You have chronic heart or lung disease.  You have a weakened defense (immune) system.  You are very young or very old.  You have nasal allergies or asthma.  You work in crowded or poorly ventilated areas.  You work in health care facilities or schools.  What are the signs or symptoms? Symptoms typically develop 2-3 days after you come in contact with a cold virus. Most viral URIs last 7-10 days. However, viral URIs from the influenza virus (flu virus) can last 14-18 days and are typically more severe. Symptoms may include:  Runny or stuffy (congested) nose.  Sneezing.  Cough.  Sore throat.  Headache.  Fatigue.  Fever.  Loss of appetite.  Pain in your forehead, behind your eyes, and over your cheekbones (sinus pain).  Muscle aches.  How is this diagnosed? Your health care provider may diagnose a URI by:  Physical exam.  Tests to check that your  symptoms are not due to another condition such as: ? Strep throat. ? Sinusitis. ? Pneumonia. ? Asthma.  How is this treated? A URI goes away on its own with time. It cannot be cured with medicines, but medicines may be prescribed or recommended to relieve symptoms. Medicines may help:  Reduce your fever.  Reduce your cough.  Relieve nasal congestion.  Follow these instructions at home:  Take medicines only as directed by your health care provider.  Gargle warm saltwater or take cough drops to comfort your throat as directed by your health care provider.  Use a warm mist humidifier or inhale steam from a shower to increase air moisture. This may make it easier to breathe.  Drink enough fluid to keep your urine clear or pale yellow.  Eat soups and other clear broths and maintain good nutrition.  Rest as needed.  Return to work when your temperature has returned to normal or as your health care provider advises. You may need to stay home longer to avoid infecting others. You can also use a face mask and careful hand washing to prevent spread of the virus.  Increase the usage of your inhaler if you have asthma.  Do not use any tobacco products, including cigarettes, chewing tobacco, or electronic cigarettes. If you need help quitting, ask your health care provider. How is this prevented? The best way to protect yourself from getting a cold is to practice good hygiene.  Avoid oral or hand contact with people with cold symptoms.  Wash your   hands often if contact occurs.  There is no clear evidence that vitamin C, vitamin E, echinacea, or exercise reduces the chance of developing a cold. However, it is always recommended to get plenty of rest, exercise, and practice good nutrition. Contact a health care provider if:  You are getting worse rather than better.  Your symptoms are not controlled by medicine.  You have chills.  You have worsening shortness of breath.  You have  brown or red mucus.  You have yellow or brown nasal discharge.  You have pain in your face, especially when you bend forward.  You have a fever.  You have swollen neck glands.  You have pain while swallowing.  You have white areas in the back of your throat. Get help right away if:  You have severe or persistent: ? Headache. ? Ear pain. ? Sinus pain. ? Chest pain.  You have chronic lung disease and any of the following: ? Wheezing. ? Prolonged cough. ? Coughing up blood. ? A change in your usual mucus.  You have a stiff neck.  You have changes in your: ? Vision. ? Hearing. ? Thinking. ? Mood. This information is not intended to replace advice given to you by your health care provider. Make sure you discuss any questions you have with your health care provider. Document Released: 09/23/2000 Document Revised: 12/01/2015 Document Reviewed: 07/05/2013 Elsevier Interactive Patient Education  2017 Elsevier Inc.  

## 2016-03-09 NOTE — Progress Notes (Signed)
   Subjective:    Patient ID: [name] [name], female    DOB: 05/22/1931, 82 y.o.   MRN: [id number]  Cough  This is a new problem. The current episode started in the past 7 days. The problem has been gradually worsening. The problem occurs every few minutes. The cough is non-productive. Associated symptoms include chills, ear congestion, headaches, nasal congestion, postnasal drip, rhinorrhea, a sore throat and shortness of breath. Pertinent negatives include no ear pain, fever, myalgias or wheezing. The symptoms are aggravated by lying down. She has tried rest (tylenol) for the symptoms. The treatment provided mild relief. There is no history of asthma or COPD.      Review of Systems  Constitutional: Positive for chills. Negative for fever.  HENT: Positive for postnasal drip, rhinorrhea and sore throat. Negative for ear pain.   Respiratory: Positive for cough and shortness of breath. Negative for wheezing.   Musculoskeletal: Negative for myalgias.  Neurological: Positive for headaches.  All other systems reviewed and are negative.      Objective:   Physical Exam  Constitutional: She is oriented to person, place, and time. She appears well-developed and well-nourished.  HENT:  Head: Normocephalic.  Right Ear: External ear normal.  Left Ear: External ear normal.  Nose: Mucosal edema and rhinorrhea present.  Mouth/Throat: Posterior oropharyngeal erythema present.  Cardiovascular: Normal rate, regular rhythm, normal heart sounds and intact distal pulses.   Pulmonary/Chest: Effort normal and breath sounds normal.  Neurological: She is alert and oriented to person, place, and time.  Skin: Skin is warm and dry.  Psychiatric: She has a normal mood and affect. Her behavior is normal. Judgment and thought content normal.      BP 115/68   Pulse (!) 58   Temp (!) 96.6 F (35.9 C) (Oral)   Ht 5' 1.5" (1.562 m)   Wt 134 lb 9.6 oz (61.1 kg)   BMI 25.02 kg/m      Assessment & Plan:    1. Acute upper respiratory infection -- Take meds as prescribed - Use a cool mist humidifier  -Use saline nose sprays frequently -Saline irrigations of the nose can be very helpful if done frequently.  * 4X daily for 1 week*  * Use of a nettie pot can be helpful with this. Follow directions with this* -Force fluids -For any cough or congestion  Use plain Mucinex- regular strength or max strength is fine   * Children- consult with Pharmacist for dosing -For fever or aces or pains- take tylenol or ibuprofen appropriate for age and weight.  * for fevers greater than 101 orally you may alternate ibuprofen and tylenol every  3 hours. -Throat lozenges if help - fluticasone (FLONASE) 50 MCG/ACT nasal spray; USE ONE SPRAY in each nostril ONCE daily  Dispense: 16 g; Refill: 4 - benzonatate (TESSALON) 200 MG capsule; Take 1 capsule (200 mg total) by mouth 3 (three) times daily as needed.  Dispense: 30 capsule; Refill: [place], FNP

## 2016-03-16 ENCOUNTER — Encounter: Payer: Self-pay | Admitting: Family Medicine

## 2016-03-16 ENCOUNTER — Other Ambulatory Visit: Payer: Self-pay | Admitting: Family Medicine

## 2016-03-16 ENCOUNTER — Ambulatory Visit (INDEPENDENT_AMBULATORY_CARE_PROVIDER_SITE_OTHER): Payer: Medicare Other | Admitting: Family Medicine

## 2016-03-16 VITALS — BP 138/58 | HR 60 | Temp 96.7°F | Ht 61.5 in | Wt 136.0 lb

## 2016-03-16 DIAGNOSIS — R05 Cough: Secondary | ICD-10-CM

## 2016-03-16 DIAGNOSIS — J42 Unspecified chronic bronchitis: Secondary | ICD-10-CM

## 2016-03-16 DIAGNOSIS — R059 Cough, unspecified: Secondary | ICD-10-CM

## 2016-03-16 DIAGNOSIS — J069 Acute upper respiratory infection, unspecified: Secondary | ICD-10-CM | POA: Diagnosis not present

## 2016-03-16 MED ORDER — AZITHROMYCIN 250 MG PO TABS: ORAL_TABLET | ORAL | 0 refills | Status: DC

## 2016-03-16 NOTE — Progress Notes (Signed)
Subjective:    Patient ID: [name] [name], female    DOB: October 24, 1931, 82 y.o.   MRN: [id number]  HPI Patient here today for URI. She complains of nasal congestion and cough that started around 03/06/16.This patient has a long history of bronchospasm chronic bronchitis and allergic rhinitis. This particular episode with nasal congestion and cough started around November 24. She did see one of the mid levels and got some Tessalon Perles. She also has a history of diabetes. The patient says that her diabetes has been under good control. She is monitored closely by her 2 daughters.   Patient Active Problem List   Diagnosis Date Noted  . Type 2 diabetes mellitus with hyperlipidemia ([facility]) 09/26/2015  . COPD (chronic obstructive pulmonary disease) ([facility]) 05/29/2015  . Type 2 diabetes mellitus, controlled ([facility]) 08/30/2014  . Rectal bleeding 03/07/2014  . BRBPR (bright red blood per rectum) 03/07/2014  . History of total left hip arthroplasty 01/25/2014  . Elevated liver enzymes 01/13/2014  . Edema 01/02/2014  . Hypoxia 01/02/2014  . S/P total hip arthroplasty 12/27/2013  . Chronic cough 10/26/2013  . Hyperlipidemia   . Anxiety   . Depressive disorder, not elsewhere classified   . Rhinitis   . Atrophic vaginitis   . Postmenopausal    Outpatient Encounter Prescriptions as of 03/16/2016  Medication Sig  . albuterol (PROVENTIL) (2.5 MG/3ML) 0.083% nebulizer solution Take 3 mLs (2.5 mg total) by nebulization every 6 (six) hours as needed for wheezing or shortness of breath.  . ALPRAZolam (XANAX) 0.25 MG tablet TAKE ONE-HALF TABLET BY MOUTH FOUR TIMES DAILY AS NEEDED  . amLODipine (NORVASC) 2.5 MG tablet Take 1 Tablet by mouth once daily  . aspirin 81 MG tablet Take 81 mg by mouth every other day.  . benzonatate (TESSALON) 200 MG capsule Take 1 capsule (200 mg total) by mouth 3 (three) times daily as needed.  . blood glucose meter kit and supplies KIT Check BS once daily  .  budesonide-formoterol (SYMBICORT) 160-4.5 MCG/ACT inhaler Inhale 2 puffs into the lungs 2 (two) times daily.  . calcium carbonate (OS-CAL) 600 MG TABS tablet Take 600 mg by mouth 2 (two) times daily with a meal.  . cholecalciferol (VITAMIN D) 1000 UNITS tablet Take 2,000 Units by mouth daily.  Marland Kitchen escitalopram (LEXAPRO) 10 MG tablet Take 1 Tablet by mouth once daily as instructed  . fluticasone (FLONASE) 50 MCG/ACT nasal spray USE ONE SPRAY in each nostril ONCE daily  . gabapentin (NEURONTIN) 100 MG capsule Begin 1 tablet at bedtime. After 4 days and 1 tablet in the p.m. After 4 days take 3 times a day  . glucose blood (ONETOUCH VERIO) test strip Test Blood sugar four times daily  . glucose blood test strip Use to check BG twice a day and PRN  Dx:  E11.9  . lovastatin (MEVACOR) 40 MG tablet TAKE 1 TABLET ONCE A DAY  . metFORMIN (GLUCOPHAGE-XR) 500 MG 24 hr tablet Take 1 Tablet by mouth once daily with breakfast  . ONETOUCH DELICA LANCETS 49S MISC Use to check BG once a day to every other day Dx: E 11.9   No facility-administered encounter medications on file as of 03/16/2016.       Review of Systems  Constitutional: Negative.   HENT: Positive for congestion (nasal congestion).   Eyes: Negative.   Respiratory: Positive for cough.   Cardiovascular: Negative.   Gastrointestinal: Negative.   Endocrine: Negative.   Genitourinary: Negative.   Musculoskeletal: Negative.  Skin: Negative.   Allergic/Immunologic: Negative.   Neurological: Negative.   Hematological: Negative.   Psychiatric/Behavioral: Negative.        Objective:   Physical Exam  Constitutional: She is oriented to person, place, and time. She appears well-developed and well-nourished. No distress.  Elderly but alert  HENT:  Head: Normocephalic and atraumatic.  Right Ear: External ear normal.  Left Ear: External ear normal.  Mouth/Throat: No oropharyngeal exudate.  The throat is slightly red posteriorly and there is nasal  congestion and clear rhinorrhea bilaterally.  Eyes: Conjunctivae and EOM are normal. Pupils are equal, round, and reactive to light. Right eye exhibits no discharge. Left eye exhibits no discharge.  Neck: Normal range of motion. Neck supple. No thyromegaly present.  Cardiovascular: Normal rate, regular rhythm and normal heart sounds.   No murmur heard. Heart is regular  Pulmonary/Chest: Effort normal and breath sounds normal. She has no wheezes. She has no rales.  Diminished breath sounds bilaterally  Musculoskeletal: Normal range of motion. She exhibits no edema.  Neurological: She is alert and oriented to person, place, and time.  Skin: Skin is warm and dry. No rash noted.  Psychiatric: She has a normal mood and affect. Her behavior is normal. Judgment and thought content normal.  Nursing note and vitals reviewed.   BP (!) 138/58 (BP Location: Left Arm)   Pulse 60   Temp (!) 96.7 F (35.9 C) (Oral)   Ht 5' 1.5" (1.562 m)   Wt 136 lb (61.7 kg)   BMI 25.28 kg/m        Assessment & Plan:  1. Cough -Mucinex as needed for cough 1 twice daily with a large glass of water - CBC with Differential/Platelet  2. Upper respiratory tract infection, unspecified type -Take Zithromax as directed and use nasal saline frequently in each nostril  Meds ordered this encounter  Medications  . azithromycin (ZITHROMAX) 250 MG tablet    Sig: As directed    Dispense:  6 tablet    Refill:  0   Patient Instructions  Take Mucinex as needed Use nasal saline frequently in each nostril 4 times daily Take antibiotic as directed and until completed  Arrie Senate MD

## 2016-03-16 NOTE — Patient Instructions (Signed)
Take Mucinex as needed Use nasal saline frequently in each nostril 4 times daily Take antibiotic as directed and until completed

## 2016-03-17 LAB — CBC WITH DIFFERENTIAL/PLATELET
BASOS ABS: 0 10*3/uL (ref 0.0–0.2)
Basos: 0 %
EOS (ABSOLUTE): 0.1 10*3/uL (ref 0.0–0.4)
Eos: 1 %
Hematocrit: 42.6 % (ref 34.0–46.6)
Hemoglobin: 14.2 g/dL (ref 11.1–15.9)
IMMATURE GRANS (ABS): 0 10*3/uL (ref 0.0–0.1)
IMMATURE GRANULOCYTES: 0 %
LYMPHS: 36 %
Lymphocytes Absolute: 2.8 10*3/uL (ref 0.7–3.1)
MCH: 30.3 pg (ref 26.6–33.0)
MCHC: 33.3 g/dL (ref 31.5–35.7)
MCV: 91 fL (ref 79–97)
MONOS ABS: 0.5 10*3/uL (ref 0.1–0.9)
Monocytes: 6 %
NEUTROS ABS: 4.4 10*3/uL (ref 1.4–7.0)
NEUTROS PCT: 57 %
PLATELETS: 291 10*3/uL (ref 150–379)
RBC: 4.68 x10E6/uL (ref 3.77–5.28)
RDW: 13.9 % (ref 12.3–15.4)
WBC: 7.8 10*3/uL (ref 3.4–10.8)

## 2016-03-18 NOTE — Telephone Encounter (Signed)
Please review and advise Pt last seen 03/17/2016

## 2016-03-23 NOTE — Telephone Encounter (Signed)
RX called into [facility] per Dr Laurance Flatten

## 2016-04-11 ENCOUNTER — Emergency Department (HOSPITAL_COMMUNITY)
Admission: EM | Admit: 2016-04-11 | Discharge: 2016-04-11 | Disposition: A | Discharge status: Home / Self Care | Payer: Medicare Other | Attending: Emergency Medicine | Admitting: Emergency Medicine

## 2016-04-11 ENCOUNTER — Emergency Department (HOSPITAL_COMMUNITY): Payer: Medicare Other

## 2016-04-11 ENCOUNTER — Encounter (HOSPITAL_COMMUNITY): Payer: Self-pay | Admitting: *Deleted

## 2016-04-11 DIAGNOSIS — R1110 Vomiting, unspecified: Secondary | ICD-10-CM | POA: Diagnosis not present

## 2016-04-11 DIAGNOSIS — J449 Chronic obstructive pulmonary disease, unspecified: Secondary | ICD-10-CM | POA: Insufficient documentation

## 2016-04-11 DIAGNOSIS — Z7982 Long term (current) use of aspirin: Secondary | ICD-10-CM | POA: Insufficient documentation

## 2016-04-11 DIAGNOSIS — Z7984 Long term (current) use of oral hypoglycemic drugs: Secondary | ICD-10-CM | POA: Diagnosis not present

## 2016-04-11 DIAGNOSIS — I10 Essential (primary) hypertension: Secondary | ICD-10-CM | POA: Insufficient documentation

## 2016-04-11 DIAGNOSIS — R112 Nausea with vomiting, unspecified: Secondary | ICD-10-CM | POA: Diagnosis not present

## 2016-04-11 DIAGNOSIS — E119 Type 2 diabetes mellitus without complications: Secondary | ICD-10-CM | POA: Insufficient documentation

## 2016-04-11 DIAGNOSIS — R42 Dizziness and giddiness: Secondary | ICD-10-CM | POA: Insufficient documentation

## 2016-04-11 DIAGNOSIS — Z79899 Other long term (current) drug therapy: Secondary | ICD-10-CM | POA: Insufficient documentation

## 2016-04-11 LAB — URINALYSIS, ROUTINE W REFLEX MICROSCOPIC
BILIRUBIN URINE: NEGATIVE
Bacteria, UA: NONE SEEN
Glucose, UA: NEGATIVE mg/dL
Hgb urine dipstick: NEGATIVE
Ketones, ur: 5 mg/dL — AB
NITRITE: NEGATIVE
PH: 8 (ref 5.0–8.0)
Protein, ur: NEGATIVE mg/dL
SPECIFIC GRAVITY, URINE: 1.009 (ref 1.005–1.030)

## 2016-04-11 LAB — COMPREHENSIVE METABOLIC PANEL
ALK PHOS: 63 U/L (ref 38–126)
ALT: 16 U/L (ref 14–54)
ANION GAP: 9 (ref 5–15)
AST: 22 U/L (ref 15–41)
Albumin: 4.3 g/dL (ref 3.5–5.0)
BILIRUBIN TOTAL: 0.9 mg/dL (ref 0.3–1.2)
BUN: 12 mg/dL (ref 6–20)
CALCIUM: 9.2 mg/dL (ref 8.9–10.3)
CO2: 27 mmol/L (ref 22–32)
Chloride: 97 mmol/L — ABNORMAL LOW (ref 101–111)
Creatinine, Ser: 0.67 mg/dL (ref 0.44–1.00)
Glucose, Bld: 126 mg/dL — ABNORMAL HIGH (ref 65–99)
Potassium: 4.1 mmol/L (ref 3.5–5.1)
SODIUM: 133 mmol/L — AB (ref 135–145)
TOTAL PROTEIN: 7.2 g/dL (ref 6.5–8.1)

## 2016-04-11 LAB — CBC WITH DIFFERENTIAL/PLATELET
BASOS PCT: 0 %
Basophils Absolute: 0 10*3/uL (ref 0.0–0.1)
EOS ABS: 0.1 10*3/uL (ref 0.0–0.7)
EOS PCT: 0 %
HCT: 41.7 % (ref 36.0–46.0)
HEMOGLOBIN: 13.8 g/dL (ref 12.0–15.0)
Lymphocytes Relative: 8 %
Lymphs Abs: 0.9 10*3/uL (ref 0.7–4.0)
MCH: 30.7 pg (ref 26.0–34.0)
MCHC: 33.1 g/dL (ref 30.0–36.0)
MCV: 92.7 fL (ref 78.0–100.0)
Monocytes Absolute: 0.3 10*3/uL (ref 0.1–1.0)
Monocytes Relative: 3 %
NEUTROS PCT: 89 %
Neutro Abs: 10.2 10*3/uL — ABNORMAL HIGH (ref 1.7–7.7)
PLATELETS: 251 10*3/uL (ref 150–400)
RBC: 4.5 MIL/uL (ref 3.87–5.11)
RDW: 13.5 % (ref 11.5–15.5)
WBC: 11.5 10*3/uL — AB (ref 4.0–10.5)

## 2016-04-11 LAB — I-STAT TROPONIN, ED: TROPONIN I, POC: 0 ng/mL (ref 0.00–0.08)

## 2016-04-11 LAB — LIPASE, BLOOD: LIPASE: 22 U/L (ref 11–51)

## 2016-04-11 MED ORDER — ONDANSETRON HCL 4 MG PO TABS: 4.0000 mg | ORAL_TABLET | Freq: Four times a day (QID) | ORAL | 0 refills | Status: DC

## 2016-04-11 MED ORDER — ONDANSETRON HCL 4 MG/2ML IJ SOLN
4.0000 mg | Freq: Once | INTRAMUSCULAR | Status: AC
  Administered 2016-04-11: 4 mg via INTRAVENOUS
  Filled 2016-04-11: qty 2

## 2016-04-11 NOTE — ED Triage Notes (Signed)
Pt is here with her daughters.  She lives home alone and [place] [employer] checks on her daily for a wellfare check and she told the officer that she "felt strange" and was nauseated and and it felt like the room was spinning a little and she had to hold on to something to walk.  Pt also felt nauseated and vomited x1.  EMS responded and checked an EKG and VS and found that pt was hypertensive.  No facial droop or focal weakness.  Pt is alert and oriented at this time.  VSS in triage

## 2016-04-11 NOTE — ED Notes (Signed)
Pt returned from CT and X-ray.

## 2016-04-11 NOTE — ED Notes (Signed)
Per family the BP taken on scene was 174/76

## 2016-04-11 NOTE — Discharge Instructions (Signed)

## 2016-04-11 NOTE — ED Notes (Signed)
Patient transported to CT and X ray 

## 2016-04-11 NOTE — Care Management Note (Signed)
Case Management Note  Patient Details  Name: [name] [name] MRN: [id number] Date of Birth: Jul 20, 1931  Subjective/Objective:  82 y.o. F seen in the ED after PD rounded on her and she complained of "not feeling right". CM received call from MD to assist with [facility].  Spoke with Daughter, [name] at bedside with the verbal consent of pt . Both choose [facility] to provide HH.                  Action/Plan: Dierdre Searles, Representative for [facility] to make referral for HHPT, OT, SW and Aide. No further CM needs.    Expected Discharge Date:                  Expected Discharge Plan:  [place]  In-House Referral:  NA  Discharge planning Services  CM Consult  Post Acute Care Choice:  Home Health Choice offered to:  Adult Children (Spoke with [name], daughter at bedside, with verbal consent from Mother )  DME Arranged:  N/A (Has RW) DME Agency:  NA  HH Arranged:  PT, OT, Social Work, Nurse's Aide [place] Agency:  [place]  Status of Service:     If discussed at [employer], dates discussed:    Additional Comments:  Delrae Sawyers, RN 04/11/2016, 3:17 PM

## 2016-04-11 NOTE — ED Provider Notes (Signed)
Emergency Department Provider Note   I have reviewed the triage vital signs and the nursing notes.   HISTORY  Chief Complaint Hypertension; Nausea; and Dizziness   HPI [name] [name] is a 82 y.o. female with PMH of anxiety, COPD, DM, HTN presents to the emergency department for evaluation of elevated blood pressure, nausea/vomiting, and generalized weakness with mild vertigo symptoms. Patient states that she woke up this morning and "just wasn't feeling right." She denies any chest pain or dyspnea. No headaches. She ate breakfast and took her morning medications including Xanax and hypertension medication. Shortly after she began to feel nauseated and had vomiting in the car with some vertigo symptoms that made it difficult for her to walk. Vertigo has since resolved. Not worse with movement. No vertigo symptoms at rest. Family denies noticing any speech changes. The patient does not appreciate any numbness or unilateral weakness. Denies any difficulty swallowing. Patient states she did take her medications this morning but that many of them came up when she vomited. No known sick contacts.    Past Medical History:  Diagnosis Date  . Anxiety   . Arthritis   . Atrophic vaginitis   . Cataract   . COPD (chronic obstructive pulmonary disease) ([place])   . Depressive disorder, not elsewhere classified   . Diabetes mellitus without complication ([place])   . Disorder of bone and cartilage, unspecified   . Elevated blood sugar   . Emphysema lung ([place])   . Hypertension   . Other and unspecified hyperlipidemia   . Postmenopausal   . Rhinitis     Patient Active Problem List   Diagnosis Date Noted  . Type 2 diabetes mellitus with hyperlipidemia ([place]) 09/26/2015  . COPD (chronic obstructive pulmonary disease) ([place]) 05/29/2015  . Type 2 diabetes mellitus, controlled ([place]) 08/30/2014  . Rectal bleeding 03/07/2014  . BRBPR (bright red blood per rectum) 03/07/2014  . History of total left hip  arthroplasty 01/25/2014  . Elevated liver enzymes 01/13/2014  . Edema 01/02/2014  . Hypoxia 01/02/2014  . S/P total hip arthroplasty 12/27/2013  . Chronic cough 10/26/2013  . Hyperlipidemia   . Anxiety   . Depressive disorder, not elsewhere classified   . Rhinitis   . Atrophic vaginitis   . Postmenopausal     Past Surgical History:  Procedure Laterality Date  . APPENDECTOMY  1957  . CATARACT EXTRACTION W/ INTRAOCULAR LENS IMPLANT Bilateral 2005   [place]  . COLONOSCOPY N/A 03/08/2014   Procedure: COLONOSCOPY;  Surgeon: Rogene Houston, MD;  [facility];  Service: Endoscopy;  Laterality: N/A;  . EYE SURGERY    . TOTAL HIP ARTHROPLASTY Left 12/27/2013   Procedure: LEFT TOTAL HIP ARTHROPLASTY;  Surgeon: Newt Minion, MD;  Location: [facility];  Service: Orthopedics;  Laterality: Left;    Current Outpatient Rx  . Order #: [id number] Class: Phone In  . Order #: [id number] Class: Normal  . Order #: [id number] Class: Historical Med  . Order #: [id number] Class: Print  . Order #: [id number] Class: Sample  . Order #: [id number] Class: Historical Med  . Order #: [id number] Class: Historical Med  . Order #: [id number] Class: Normal  . Order #: [id number] Class: Normal  . Order #: [id number] Class: Normal  . Order #: [id number] Class: Normal  . Order #: [id number] Class: Normal  . Order #: [id number] Class: Normal  . Order #: [id number] Class: Normal  . Order #: [id number] Class: Normal  . Order #: [id number] Class: Normal  . Order #: [id number] Class: Normal  . Order #: [id number] Class: Print  Allergies Lisinopril; Chlordiazepoxide-clidinium; Clarithromycin; Erymax [erythromycin]; Evista [raloxifene hydrochloride]; Penicillins; Sertraline hcl; Sulfa antibiotics; Betadine [povidone iodine]; and Hydrogen peroxide  Family History  Problem Relation Age of Onset  . Arthritis Mother   . COPD Father   . Diabetes Father   . Aneurysm Sister   . Diabetes Sister   . Diabetes Sister   . Hypertension Sister       Social History Social History  Substance Use Topics  . Smoking status: Never Smoker  . Smokeless tobacco: Never Used  . Alcohol use No    Review of Systems  Constitutional: No fever/chills Eyes: No visual changes. ENT: No sore throat. Cardiovascular: Denies chest pain. Positive elevated BP.  Respiratory: Denies shortness of breath. Gastrointestinal: No abdominal pain. Positive nausea and vomiting.  No diarrhea.  No constipation. Genitourinary: Negative for dysuria. Musculoskeletal: Negative for back pain. Skin: Negative for rash. Neurological: Negative for headaches, focal weakness or numbness.  10-point ROS otherwise negative.  ____________________________________________   PHYSICAL EXAM:  VITAL SIGNS: ED Triage Vitals  Enc Vitals Group     BP 04/11/16 1035 (!) 149/52     Pulse --      Resp 04/11/16 1035 16     Temp 04/11/16 1035 97.4 F (36.3 C)     Temp Source 04/11/16 1035 Oral     SpO2 04/11/16 1035 96 %     Weight 04/11/16 1036 132 lb (59.9 kg)     Pain Score 04/11/16 1037 0   Constitutional: Alert and oriented. Well appearing and in no acute distress. Eyes: Conjunctivae are normal. PERRL. EOMI. Head: Atraumatic. Nose: No congestion/rhinnorhea. Mouth/Throat: Mucous membranes are moist.  Oropharynx non-erythematous. Neck: No stridor.  Cardiovascular: Normal rate, regular rhythm. Good peripheral circulation. Grossly normal heart sounds.   Respiratory: Normal respiratory effort.  No retractions. Lungs CTAB. Gastrointestinal: Soft and nontender. No distention.  Musculoskeletal: No lower extremity tenderness nor edema. No gross deformities of extremities. Neurologic:  Normal speech and language. No gross focal neurologic deficits are appreciated. No pronator drift. Normal finger to nose testing with intention tremor (family states this is baseline).  Skin:  Skin is warm, dry and intact. No rash noted.  ____________________________________________    LABS (all labs ordered are listed, but only abnormal results are displayed)  Labs Reviewed  COMPREHENSIVE METABOLIC PANEL - Abnormal; Notable for the following:       Result Value   Sodium 133 (*)    Chloride 97 (*)    Glucose, Bld 126 (*)    All other components within normal limits  CBC WITH DIFFERENTIAL/PLATELET - Abnormal; Notable for the following:    WBC 11.5 (*)    Neutro Abs 10.2 (*)    All other components within normal limits  URINALYSIS, ROUTINE W REFLEX MICROSCOPIC - Abnormal; Notable for the following:    APPearance CLOUDY (*)    Ketones, ur 5 (*)    Leukocytes, UA TRACE (*)    Non Squamous Epithelial 0-5 (*)    All other components within normal limits  LIPASE, BLOOD  I-STAT TROPOININ, ED   ____________________________________________  EKG   EKG Interpretation  Date/Time:  Saturday April 11 2016 10:58:43 EST Ventricular Rate:  69 PR Interval:    QRS Duration: 93 QT Interval:  428 QTC Calculation: 459 R Axis:   -39 Text Interpretation:  Sinus rhythm Ventricular premature complex Left axis deviation Probable anteroseptal infarct, old No STEMI.  Confirmed by LONG MD, JOSHUA [phone]) on 04/11/2016 12:45:58 PM  ____________________________________________  RADIOLOGY  Dg Chest 2 View  Result Date: 04/11/2016 CLINICAL DATA:  Dizzy, not feeling this well this morning, vomiting EXAM: CHEST  2 VIEW COMPARISON:  10/08/2015 FINDINGS: Borderline cardiomegaly. No mediastinal or hilar masses. No evidence of adenopathy. Lungs are mildly hyperexpanded but clear. No pleural effusion. No pneumothorax. Skeletal structures are demineralized but grossly intact. IMPRESSION: No acute cardiopulmonary disease. Electronically Signed   By: Lajean Manes M.D.   On: 04/11/2016 11:58   Ct Head Wo Contrast  Result Date: 04/11/2016 CLINICAL DATA:  Vertigo, weakness, hypertension EXAM: CT HEAD WITHOUT CONTRAST TECHNIQUE: Contiguous axial images were obtained from the base of  the skull through the vertex without intravenous contrast. COMPARISON:  11/20/2013 FINDINGS: Brain: No acute intracranial abnormality. Specifically, no hemorrhage, hydrocephalus, mass lesion, acute infarction, or significant intracranial injury. Vascular: No hyperdense vessel or unexpected calcification. Skull: No acute calvarial abnormality. Sinuses/Orbits: Visualized paranasal sinuses and mastoids clear. Orbital soft tissues unremarkable. Other: None IMPRESSION: No acute intracranial abnormality. Electronically Signed   By: Rolm Baptise M.D.   On: 04/11/2016 11:54    ____________________________________________   PROCEDURES  Procedure(s) performed:   Procedures  None ____________________________________________   INITIAL IMPRESSION / ASSESSMENT AND PLAN / ED COURSE  Pertinent labs & imaging results that were available during my care of the patient were reviewed by me and considered in my medical decision making (see chart for details).  Patient resents emergency department for evaluation of nausea, vomiting, elevated blood pressure. She had some mild vertigo symptoms which resolved spontaneously. No focal neurological deficits on my exam. I suspect that the patient's symptoms are most likely due to a gastritis with nausea and vomiting being her main presenting symptoms. Low suspicion for central cause of vertigo or stroke. Low suspicion for atypical acute coronary syndrome. Plan to obtain EKG, troponin, labs, CT head with vomiting and vertigo, chest x-ray and reassess afterwards. Plan for ambulation after scans resulted. Will treat nausea.   01:05 PM Patient is ambulatory to the bathroom with minor assistance. Family report that her gait seems like her normal. Patient reports feeling better. Will discuss with social work to arrange home health PT/OT/Nursing visit and PCP evaluation next week.  ____________________________________________  FINAL CLINICAL IMPRESSION(S) / ED  DIAGNOSES  Final diagnoses:  Non-intractable vomiting with nausea, unspecified vomiting type  Hypertension, unspecified type     MEDICATIONS GIVEN DURING THIS VISIT:  Medications  ondansetron (ZOFRAN) injection 4 mg (4 mg Intravenous Given 04/11/16 1128)     NEW OUTPATIENT MEDICATIONS STARTED DURING THIS VISIT:  Discharge Medication List as of 04/11/2016  2:01 PM    START taking these medications   Details  ondansetron (ZOFRAN) 4 MG tablet Take 1 tablet (4 mg total) by mouth every 6 (six) hours., Starting Sat 04/11/2016, Print        Note:  This document was prepared using Dragon voice recognition software and may include unintentional dictation errors.  Nanda Quinton, MD Emergency Medicine   Margette Fast, MD 04/12/16 [phone]

## 2016-04-11 NOTE — ED Notes (Signed)
Patient walked to the bathroom with 1 person assist. Patient stated that she feels dizzy. While walking back to the room from the bathroom the patient was shuffling her feet and is unsteady. Notified the RN and Dr.Long.

## 2016-04-15 ENCOUNTER — Ambulatory Visit (INDEPENDENT_AMBULATORY_CARE_PROVIDER_SITE_OTHER): Payer: Medicare Other | Admitting: Family Medicine

## 2016-04-15 ENCOUNTER — Encounter: Payer: Self-pay | Admitting: Family Medicine

## 2016-04-15 VITALS — BP 97/63 | HR 64 | Temp 96.6°F | Ht 61.5 in | Wt 134.0 lb

## 2016-04-15 DIAGNOSIS — B349 Viral infection, unspecified: Secondary | ICD-10-CM | POA: Diagnosis not present

## 2016-04-15 DIAGNOSIS — Z09 Encounter for follow-up examination after completed treatment for conditions other than malignant neoplasm: Secondary | ICD-10-CM

## 2016-04-15 NOTE — Patient Instructions (Signed)
The patient to stay on plenty of fluids and stay well hydrated She should be exerting caution when she is in crowds of people in order to avoid any respiratory infections or intestinal viruses She should use hand cleansers regularly No further studies appear to be necessary in light of her improvement.  She has a follow-up appointment later this month.

## 2016-04-15 NOTE — Progress Notes (Signed)
 Subjective:    Patient ID: [name] [name], female    DOB: 08/19/1931, 84 y.o.   MRN: [id number]  HPI Patient here today for hospital follow up from [facility] on 04/11/16. Her diagnosis was vomiting. Since home she has had some cough and now rib pain. The patient recently visited the emergency room because of vomiting. She was given Zofran and this seemed to help a lot. Hospital results were reviewed. The patient comes to the visit today with her daughter. She seems to be in good spirits. She seems to have recovered from this episode of vomiting and weakness that occurred over the weekend. She denies any chest pain or shortness of breath other than some soreness from coughing. She has no trouble now with her into several tract including nausea vomiting diarrhea blood in the stool or black tarry bowel movements. She is passing her water well and frequently without any other symptoms.    Patient Active Problem List   Diagnosis Date Noted  . Type 2 diabetes mellitus with hyperlipidemia ([facility]) 09/26/2015  . COPD (chronic obstructive pulmonary disease) ([facility]) 05/29/2015  . Type 2 diabetes mellitus, controlled ([facility]) 08/30/2014  . Rectal bleeding 03/07/2014  . BRBPR (bright red blood per rectum) 03/07/2014  . History of total left hip arthroplasty 01/25/2014  . Elevated liver enzymes 01/13/2014  . Edema 01/02/2014  . Hypoxia 01/02/2014  . S/P total hip arthroplasty 12/27/2013  . Chronic cough 10/26/2013  . Hyperlipidemia   . Anxiety   . Depressive disorder, not elsewhere classified   . Rhinitis   . Atrophic vaginitis   . Postmenopausal    Outpatient Encounter Prescriptions as of 04/15/2016  Medication Sig  . ALPRAZolam (XANAX) 0.25 MG tablet Take 1/2 tablet by mouth 4 times a day as needed  . amLODipine (NORVASC) 2.5 MG tablet Take 1 Tablet by mouth once daily  . aspirin 81 MG tablet Take 81 mg by mouth every other day.  . blood glucose meter kit and supplies KIT Check BS once daily  .  budesonide-formoterol (SYMBICORT) 160-4.5 MCG/ACT inhaler Inhale 2 puffs into the lungs 2 (two) times daily.  . calcium carbonate (OS-CAL) 600 MG TABS tablet Take 600 mg by mouth 2 (two) times daily with a meal.  . cholecalciferol (VITAMIN D) 1000 UNITS tablet Take 2,000 Units by mouth daily.  . escitalopram (LEXAPRO) 10 MG tablet Take 1 Tablet by mouth once daily as instructed  . fluticasone (FLONASE) 50 MCG/ACT nasal spray USE ONE SPRAY in each nostril ONCE daily  . gabapentin (NEURONTIN) 100 MG capsule Begin 1 tablet at bedtime. After 4 days and 1 tablet in the p.m. After 4 days take 3 times a day  . glucose blood (ONETOUCH VERIO) test strip Test Blood sugar four times daily  . glucose blood test strip Use to check BG twice a day and PRN  Dx:  E11.9  . lovastatin (MEVACOR) 40 MG tablet TAKE 1 TABLET ONCE A DAY  . metFORMIN (GLUCOPHAGE-XR) 500 MG 24 hr tablet Take 1 Tablet by mouth once daily with breakfast  . ONETOUCH DELICA LANCETS 33G MISC Use to check BG once a day to every other day Dx: E 11.9  . [DISCONTINUED] azithromycin (ZITHROMAX) 250 MG tablet As directed  . [DISCONTINUED] benzonatate (TESSALON) 200 MG capsule Take 1 capsule (200 mg total) by mouth 3 (three) times daily as needed.  . [DISCONTINUED] ondansetron (ZOFRAN) 4 MG tablet Take 1 tablet (4 mg total) by mouth every 6 (six) hours.     No facility-administered encounter medications on file as of 04/15/2016.       Review of Systems  Constitutional: Negative.   HENT: Negative.   Eyes: Negative.   Respiratory: Positive for cough (yesterday ).   Cardiovascular: Negative.   Gastrointestinal: Negative.  Diarrhea: better.  Endocrine: Negative.   Genitourinary: Negative.   Musculoskeletal: Negative.        Rib pain   Skin: Negative.   Allergic/Immunologic: Negative.   Neurological: Negative.   Hematological: Negative.   Psychiatric/Behavioral: Negative.        Objective:   Physical Exam  Constitutional: She is oriented  to person, place, and time. She appears well-developed and well-nourished. No distress.  The patient is pleasant and relaxed.  HENT:  Head: Normocephalic and atraumatic.  Right Ear: External ear normal.  Left Ear: External ear normal.  Mouth/Throat: Oropharynx is clear and moist. No oropharyngeal exudate.  Slight nasal congestion and throat was clear.  Eyes: Conjunctivae and EOM are normal. Pupils are equal, round, and reactive to light. Right eye exhibits no discharge. Left eye exhibits no discharge. No scleral icterus.  Neck: Normal range of motion. Neck supple. No thyromegaly present.  No anterior cervical adenopathy bruits or thyromegaly  Cardiovascular: Normal rate, regular rhythm and normal heart sounds.   No murmur heard. The heart has a regular rate and rhythm  Pulmonary/Chest: Effort normal and breath sounds normal. No respiratory distress. She has no wheezes. She has no rales.  Clear anteriorly and posteriorly without any wheezes or rales  Abdominal: Soft. Bowel sounds are normal. She exhibits no mass. There is no tenderness. There is no rebound and no guarding.  No abdominal tenderness masses or bruits  Musculoskeletal: Normal range of motion. She exhibits no edema.  Lymphadenopathy:    She has no cervical adenopathy.  Neurological: She is alert and oriented to person, place, and time. She has normal reflexes. No cranial nerve deficit.  Skin: Skin is warm and dry. No rash noted.  Psychiatric: She has a normal mood and affect. Her behavior is normal. Judgment and thought content normal.  Nursing note and vitals reviewed.   BP 97/63 (BP Location: Left Arm)   Pulse 64   Temp (!) 96.6 F (35.9 C) (Oral)   Ht 5' 1.5" (1.562 m)   Wt 134 lb (60.8 kg)   BMI 24.91 kg/m        Assessment & Plan:  1. Hospital discharge follow-up -Patient appears to have recovered well from her recent episode of vomiting and weakness. Her intestinal tract is back to normal. She is feeling  stronger today and less weak.  2. Viral syndrome -This was most likely a viral syndrome which appears to have resolved.  Patient Instructions  The patient to stay on plenty of fluids and stay well hydrated She should be exerting caution when she is in crowds of people in order to avoid any respiratory infections or intestinal viruses She should use hand cleansers regularly No further studies appear to be necessary in light of her improvement.  She has a follow-up appointment later this month.  Arrie Senate MD

## 2016-04-27 ENCOUNTER — Other Ambulatory Visit: Payer: Self-pay | Admitting: Family Medicine

## 2016-04-28 ENCOUNTER — Other Ambulatory Visit: Payer: Medicare Other

## 2016-04-28 DIAGNOSIS — E7800 Pure hypercholesterolemia, unspecified: Secondary | ICD-10-CM

## 2016-04-28 DIAGNOSIS — E559 Vitamin D deficiency, unspecified: Secondary | ICD-10-CM | POA: Diagnosis not present

## 2016-04-28 DIAGNOSIS — I10 Essential (primary) hypertension: Secondary | ICD-10-CM

## 2016-04-28 DIAGNOSIS — E118 Type 2 diabetes mellitus with unspecified complications: Secondary | ICD-10-CM | POA: Diagnosis not present

## 2016-04-28 LAB — BAYER DCA HB A1C WAIVED: HB A1C: 6.1 % (ref <7.0)

## 2016-04-29 LAB — CBC WITH DIFFERENTIAL/PLATELET
BASOS: 0 %
Basophils Absolute: 0 10*3/uL (ref 0.0–0.2)
EOS (ABSOLUTE): 0.2 10*3/uL (ref 0.0–0.4)
EOS: 2 %
HEMATOCRIT: 43.1 % (ref 34.0–46.6)
Hemoglobin: 14.3 g/dL (ref 11.1–15.9)
IMMATURE GRANULOCYTES: 0 %
Immature Grans (Abs): 0 10*3/uL (ref 0.0–0.1)
LYMPHS ABS: 1.7 10*3/uL (ref 0.7–3.1)
Lymphs: 25 %
MCH: 30.9 pg (ref 26.6–33.0)
MCHC: 33.2 g/dL (ref 31.5–35.7)
MCV: 93 fL (ref 79–97)
MONOS ABS: 0.4 10*3/uL (ref 0.1–0.9)
Monocytes: 6 %
Neutrophils Absolute: 4.7 10*3/uL (ref 1.4–7.0)
Neutrophils: 67 %
Platelets: 293 10*3/uL (ref 150–379)
RBC: 4.63 x10E6/uL (ref 3.77–5.28)
RDW: 14.1 % (ref 12.3–15.4)
WBC: 7 10*3/uL (ref 3.4–10.8)

## 2016-04-29 LAB — HEPATIC FUNCTION PANEL
ALK PHOS: 68 IU/L (ref 39–117)
ALT: 10 IU/L (ref 0–32)
AST: 16 IU/L (ref 0–40)
Albumin: 4.3 g/dL (ref 3.5–4.7)
BILIRUBIN TOTAL: 0.5 mg/dL (ref 0.0–1.2)
Bilirubin, Direct: 0.14 mg/dL (ref 0.00–0.40)
Total Protein: 6.3 g/dL (ref 6.0–8.5)

## 2016-04-29 LAB — BMP8+EGFR
BUN / CREAT RATIO: 17 (ref 12–28)
BUN: 13 mg/dL (ref 8–27)
CO2: 23 mmol/L (ref 18–29)
CREATININE: 0.78 mg/dL (ref 0.57–1.00)
Calcium: 9.2 mg/dL (ref 8.7–10.3)
Chloride: 100 mmol/L (ref 96–106)
GFR calc Af Amer: 82 mL/min/{1.73_m2} (ref >59)
GFR, EST NON AFRICAN AMERICAN: 71 mL/min/{1.73_m2} (ref >59)
Glucose: 99 mg/dL (ref 65–99)
Potassium: 4.5 mmol/L (ref 3.5–5.2)
SODIUM: 138 mmol/L (ref 134–144)

## 2016-04-29 LAB — LIPID PANEL
CHOL/HDL RATIO: 2.7 ratio (ref 0.0–4.4)
Cholesterol, Total: 132 mg/dL (ref 100–199)
HDL: 49 mg/dL (ref >39)
LDL Calculated: 69 mg/dL (ref 0–99)
TRIGLYCERIDES: 70 mg/dL (ref 0–149)
VLDL Cholesterol Cal: 14 mg/dL (ref 5–40)

## 2016-04-29 LAB — VITAMIN D 25 HYDROXY (VIT D DEFICIENCY, FRACTURES): Vit D, 25-Hydroxy: 47.3 ng/mL (ref 30.0–100.0)

## 2016-05-07 ENCOUNTER — Encounter: Payer: Self-pay | Admitting: Family Medicine

## 2016-05-07 ENCOUNTER — Ambulatory Visit (INDEPENDENT_AMBULATORY_CARE_PROVIDER_SITE_OTHER): Payer: Medicare Other | Admitting: Family Medicine

## 2016-05-07 VITALS — BP 137/64 | HR 57 | Temp 97.1°F | Ht 61.5 in | Wt 137.0 lb

## 2016-05-07 DIAGNOSIS — R0789 Other chest pain: Secondary | ICD-10-CM

## 2016-05-07 DIAGNOSIS — J069 Acute upper respiratory infection, unspecified: Secondary | ICD-10-CM

## 2016-05-07 DIAGNOSIS — E118 Type 2 diabetes mellitus with unspecified complications: Secondary | ICD-10-CM

## 2016-05-07 DIAGNOSIS — E559 Vitamin D deficiency, unspecified: Secondary | ICD-10-CM | POA: Diagnosis not present

## 2016-05-07 DIAGNOSIS — I10 Essential (primary) hypertension: Secondary | ICD-10-CM | POA: Diagnosis not present

## 2016-05-07 DIAGNOSIS — E7800 Pure hypercholesterolemia, unspecified: Secondary | ICD-10-CM

## 2016-05-07 NOTE — Patient Instructions (Addendum)
Medicare Annual Wellness Visit  [facility] and the medical providers at [facility] strive to bring you the best medical care.  In doing so we not only want to address your current medical conditions and concerns but also to detect new conditions early and prevent illness, disease and health-related problems.    Medicare offers a yearly Wellness Visit which allows our clinical staff to assess your need for preventative services including immunizations, lifestyle education, counseling to decrease risk of preventable diseases and screening for fall risk and other medical concerns.    This visit is provided free of charge (no copay) for all Medicare recipients. The clinical pharmacists at [facility] have begun to conduct these Wellness Visits which will also include a thorough review of all your medications.    As you primary medical provider recommend that you make an appointment for your Annual Wellness Visit if you have not done so already this year.  You may set up this appointment before you leave today or you may call back [id number]) and schedule an appointment.  Please make sure when you call that you mention that you are scheduling your Annual Wellness Visit with the clinical pharmacist so that the appointment may be made for the proper length of time.     Continue current medications. Continue good therapeutic lifestyle changes which include good diet and exercise. Fall precautions discussed with patient. If an FOBT was given today- please return it to our front desk. If you are over 74 years old - you may need Prevnar 70 or the adult Pneumonia vaccine.  **Flu shots are available--- please call and schedule a FLU-CLINIC appointment**  After your visit with [place] today you will receive a survey in the mail or online from [employer] regarding your care with [place]. Please take a moment to fill this out. Your feedback is very  important to [place] as you can help [place] better understand your patient needs as well as improve your experience and satisfaction. WE CARE ABOUT YOU!!!  Take Tylenol for chest wall pain Continue to with checking blood sugars regularly and following your diet as you are doing Avoid crowds of people Practice good hand hygiene Continue using inhalers regularly and taking Mucinex regularly

## 2016-05-07 NOTE — Progress Notes (Signed)
Subjective:    Patient ID: [name] [name], female    DOB: 01/12/1932, 83 y.o.   MRN: [id number]  HPI Pt here for follow up and management of chronic medical problems which includes diabetes, hyperlipidemia and hypertension. She is taking medication regularly.The patient is doing well overall. She does complain today of some left upper chest wall soreness. She's had recent lab work done and everything was excellent. She is very type A about watching her diet and checking her blood sugars. She brings in blood sugars for review and these will be scanned into the record and all of these were good. Her A1c was 6.1%. Her cholesterol numbers were excellent and her liver function tests were normal and her kidney function tests were good. This will be reviewed with her during the visit. Patient comes to the visit today with her daughter. She does complain of a little bit of head congestion which she has had all winter and she is using a cool mist humidifier and nasal saline. She denies any chest pain or shortness of breath. She denies any trouble with her stomach including nausea vomiting diarrhea blood in the stool or black tarry bowel movements. She is passing her water without problems.    Patient Active Problem List   Diagnosis Date Noted  . Type 2 diabetes mellitus with hyperlipidemia ([place]) 09/26/2015  . COPD (chronic obstructive pulmonary disease) ([place]) 05/29/2015  . Type 2 diabetes mellitus, controlled ([place]) 08/30/2014  . Rectal bleeding 03/07/2014  . BRBPR (bright red blood per rectum) 03/07/2014  . History of total left hip arthroplasty 01/25/2014  . Elevated liver enzymes 01/13/2014  . Edema 01/02/2014  . Hypoxia 01/02/2014  . S/P total hip arthroplasty 12/27/2013  . Chronic cough 10/26/2013  . Hyperlipidemia   . Anxiety   . Depressive disorder, not elsewhere classified   . Rhinitis   . Atrophic vaginitis   . Postmenopausal    Outpatient Encounter Prescriptions as of 05/07/2016    Medication Sig  . ALPRAZolam (XANAX) 0.25 MG tablet Take 1/2 tablet by mouth 4 times a day as needed  . amLODipine (NORVASC) 2.5 MG tablet Take 1 Tablet by mouth once daily  . aspirin 81 MG tablet Take 81 mg by mouth every other day.  . blood glucose meter kit and supplies KIT Check BS once daily  . budesonide-formoterol (SYMBICORT) 160-4.5 MCG/ACT inhaler Inhale 2 puffs into the lungs 2 (two) times daily.  . calcium carbonate (OS-CAL) 600 MG TABS tablet Take 600 mg by mouth 2 (two) times daily with a meal.  . cholecalciferol (VITAMIN D) 1000 UNITS tablet Take 2,000 Units by mouth daily.  Marland Kitchen escitalopram (LEXAPRO) 10 MG tablet Take 1 Tablet by mouth once daily as instructed  . fluticasone (FLONASE) 50 MCG/ACT nasal spray USE ONE SPRAY in each nostril ONCE daily  . gabapentin (NEURONTIN) 100 MG capsule Begin 1 tablet at bedtime. After 4 days and 1 tablet in the p.m. After 4 days take 3 times a day  . glucose blood (ONETOUCH VERIO) test strip Test Blood sugar four times daily  . glucose blood test strip Use to check BG twice a day and PRN  Dx:  E11.9  . lovastatin (MEVACOR) 40 MG tablet TAKE 1 TABLET ONCE A DAY  . metFORMIN (GLUCOPHAGE-XR) 500 MG 24 hr tablet Take 1 Tablet by mouth once daily with breakfast  . ONETOUCH DELICA LANCETS 54Y MISC Use to check BG once a day to every other day Dx: E 11.9  No facility-administered encounter medications on file as of 05/07/2016.       Review of Systems  Constitutional: Negative.   HENT: Negative.   Eyes: Negative.   Respiratory: Positive for chest tightness (soreness - left upper chest).   Cardiovascular: Negative.   Gastrointestinal: Negative.   Endocrine: Negative.   Genitourinary: Negative.   Musculoskeletal: Negative.   Skin: Negative.   Allergic/Immunologic: Negative.   Neurological: Negative.   Hematological: Negative.   Psychiatric/Behavioral: Negative.        Objective:   Physical Exam  Constitutional: She is oriented to  person, place, and time. She appears well-developed and well-nourished. No distress.  Patient is pleasant and alert and is doing well.  HENT:  Head: Normocephalic and atraumatic.  Right Ear: External ear normal.  Left Ear: External ear normal.  Nose: Nose normal.  Mouth/Throat: Oropharynx is clear and moist.  Minimal nasal congestion  Eyes: Conjunctivae and EOM are normal. Pupils are equal, round, and reactive to light. Right eye exhibits no discharge. Left eye exhibits no discharge. No scleral icterus.  Neck: Normal range of motion. Neck supple. No thyromegaly present.  No bruits thyromegaly or anterior cervical adenopathy  Cardiovascular: Normal rate, regular rhythm, normal heart sounds and intact distal pulses.   No murmur heard. The heart is regular at 72/m  Pulmonary/Chest: Effort normal and breath sounds normal. No respiratory distress. She has no wheezes. She has no rales. She exhibits no tenderness.  Chest wall tenderness with palpation. No rash. Breath sounds are slightly diminished but stable with no rales or wheezes  Abdominal: Soft. Bowel sounds are normal. She exhibits no mass. There is no tenderness. There is no rebound and no guarding.  Musculoskeletal: Normal range of motion. She exhibits no edema.  Lymphadenopathy:    She has no cervical adenopathy.  Neurological: She is alert and oriented to person, place, and time. She has normal reflexes. No cranial nerve deficit.  Skin: Skin is warm and dry. No rash noted.  Psychiatric: She has a normal mood and affect. Her behavior is normal. Judgment and thought content normal.  Nursing note and vitals reviewed.  BP 137/64 (BP Location: Left Arm)   Pulse (!) 57   Temp 97.1 F (36.2 C) (Oral)   Ht 5' 1.5" (1.562 m)   Wt 137 lb (62.1 kg)   BMI 25.47 kg/m         Assessment & Plan:  1. Pure hypercholesterolemia -The patient's cholesterol numbers were excellent and she will continue her current statin treatment and  therapeutic lifestyle changes  2. Essential hypertension -The blood pressure is good and at goal and she will continue with current treatment  3. Vitamin D deficiency -The vitamin D level was good and she will continue with current treatment  4. Acute upper respiratory infection -The patient is doing well with taking her Mucinex and using her inhalers regularly and using nasal saline. She will continue all of this and keep the house as cool as possible and stay well hydrated and use her cool mist humidification.  5. Controlled type 2 diabetes mellitus with complication, without long-term current use of insulin (HCC) -The hemoglobin A1c was excellent she will continue with current treatment and aggressive therapeutic lifestyle changes  6. Chest wall pain -Take Tylenol and use warm wet compresses as needed  Patient Instructions                       Medicare Annual Wellness Visit  [facility] and the medical providers at [facility] strive to bring you the best medical care.  In doing so we not only want to address your current medical conditions and concerns but also to detect new conditions early and prevent illness, disease and health-related problems.    Medicare offers a yearly Wellness Visit which allows our clinical staff to assess your need for preventative services including immunizations, lifestyle education, counseling to decrease risk of preventable diseases and screening for fall risk and other medical concerns.    This visit is provided free of charge (no copay) for all Medicare recipients. The clinical pharmacists at [facility] have begun to conduct these Wellness Visits which will also include a thorough review of all your medications.    As you primary medical provider recommend that you make an appointment for your Annual Wellness Visit if you have not done so already this year.  You may set up this appointment before you leave  today or you may call back ([phone]) and schedule an appointment.  Please make sure when you call that you mention that you are scheduling your Annual Wellness Visit with the clinical pharmacist so that the appointment may be made for the proper length of time.     Continue current medications. Continue good therapeutic lifestyle changes which include good diet and exercise. Fall precautions discussed with patient. If an FOBT was given today- please return it to our front desk. If you are over 37 years old - you may need Prevnar 64 or the adult Pneumonia vaccine.  **Flu shots are available--- please call and schedule a FLU-CLINIC appointment**  After your visit with [place] today you will receive a survey in the mail or online from [employer] regarding your care with [place]. Please take a moment to fill this out. Your feedback is very important to [place] as you can help [place] better understand your patient needs as well as improve your experience and satisfaction. WE CARE ABOUT YOU!!!  Take Tylenol for chest wall pain Continue to with checking blood sugars regularly and following your diet as you are doing Avoid crowds of people Practice good hand hygiene Continue using inhalers regularly and taking Mucinex regularly   Arrie Senate MD

## 2016-05-11 ENCOUNTER — Telehealth: Payer: Self-pay | Admitting: Family Medicine

## 2016-05-11 ENCOUNTER — Other Ambulatory Visit: Payer: Medicare Other

## 2016-05-11 DIAGNOSIS — Z1211 Encounter for screening for malignant neoplasm of colon: Secondary | ICD-10-CM

## 2016-05-11 NOTE — Telephone Encounter (Signed)
Pt called - she wanted to know if generic mucinex was ok to use - yes - aware

## 2016-05-12 ENCOUNTER — Other Ambulatory Visit: Payer: Medicare Other

## 2016-05-12 DIAGNOSIS — Z1211 Encounter for screening for malignant neoplasm of colon: Secondary | ICD-10-CM | POA: Diagnosis not present

## 2016-05-13 LAB — FECAL OCCULT BLOOD, IMMUNOCHEMICAL: Fecal Occult Bld: NEGATIVE

## 2016-05-18 ENCOUNTER — Other Ambulatory Visit: Payer: Self-pay | Admitting: Family Medicine

## 2016-05-19 DIAGNOSIS — E119 Type 2 diabetes mellitus without complications: Secondary | ICD-10-CM | POA: Diagnosis not present

## 2016-05-22 ENCOUNTER — Telehealth: Payer: Self-pay | Admitting: Family Medicine

## 2016-05-23 ENCOUNTER — Other Ambulatory Visit: Payer: Self-pay | Admitting: Family Medicine

## 2016-05-23 DIAGNOSIS — E119 Type 2 diabetes mellitus without complications: Secondary | ICD-10-CM | POA: Diagnosis not present

## 2016-05-23 MED ORDER — ONETOUCH DELICA LANCETS 33G MISC: 3 refills | Status: DC

## 2016-05-23 NOTE — Telephone Encounter (Signed)
Lancets sent to pharmacy  °

## 2016-06-04 ENCOUNTER — Telehealth: Payer: Self-pay | Admitting: Family Medicine

## 2016-06-05 NOTE — Telephone Encounter (Signed)
Cerea and what is proper serving size of popcorn.  Answered patients questions

## 2016-06-05 NOTE — Telephone Encounter (Signed)
Patient has some questions about serving sizes and what is the best

## 2016-06-16 ENCOUNTER — Other Ambulatory Visit: Payer: Self-pay | Admitting: Family Medicine

## 2016-06-16 NOTE — Telephone Encounter (Signed)
Refill called to [facility] 

## 2016-06-23 ENCOUNTER — Telehealth (INDEPENDENT_AMBULATORY_CARE_PROVIDER_SITE_OTHER): Payer: Self-pay | Admitting: *Deleted

## 2016-06-23 NOTE — Telephone Encounter (Signed)
Advised that patient does not need premed for prophylaxis.

## 2016-06-23 NOTE — Telephone Encounter (Signed)
Pt dentist office calling for pre-med before pts dental cleaning. CB: [phone]

## 2016-06-25 DIAGNOSIS — H53001 Unspecified amblyopia, right eye: Secondary | ICD-10-CM | POA: Diagnosis not present

## 2016-06-25 DIAGNOSIS — Z961 Presence of intraocular lens: Secondary | ICD-10-CM | POA: Diagnosis not present

## 2016-06-25 DIAGNOSIS — H40013 Open angle with borderline findings, low risk, bilateral: Secondary | ICD-10-CM | POA: Diagnosis not present

## 2016-06-25 DIAGNOSIS — H43811 Vitreous degeneration, right eye: Secondary | ICD-10-CM | POA: Diagnosis not present

## 2016-07-02 ENCOUNTER — Ambulatory Visit: Payer: Medicare Other | Admitting: Family Medicine

## 2016-07-06 ENCOUNTER — Telehealth: Payer: Self-pay | Admitting: Family Medicine

## 2016-07-06 NOTE — Telephone Encounter (Signed)
Aware to ask her dentist when he pulls her tooth.

## 2016-07-09 ENCOUNTER — Ambulatory Visit (INDEPENDENT_AMBULATORY_CARE_PROVIDER_SITE_OTHER): Payer: Medicare Other | Admitting: Family Medicine

## 2016-07-09 ENCOUNTER — Encounter: Payer: Self-pay | Admitting: Family Medicine

## 2016-07-09 VITALS — BP 113/61 | HR 73 | Temp 96.5°F | Ht 61.5 in | Wt 137.0 lb

## 2016-07-09 DIAGNOSIS — M940 Chondrocostal junction syndrome [Tietze]: Secondary | ICD-10-CM

## 2016-07-09 DIAGNOSIS — J441 Chronic obstructive pulmonary disease with (acute) exacerbation: Secondary | ICD-10-CM

## 2016-07-09 MED ORDER — BACLOFEN 10 MG PO TABS: 10.0000 mg | ORAL_TABLET | Freq: Three times a day (TID) | ORAL | 0 refills | Status: DC

## 2016-07-09 MED ORDER — CEPHALEXIN 500 MG PO CAPS: 500.0000 mg | ORAL_CAPSULE | Freq: Four times a day (QID) | ORAL | 0 refills | Status: DC

## 2016-07-09 MED ORDER — PREDNISONE 20 MG PO TABS: ORAL_TABLET | ORAL | 0 refills | Status: DC

## 2016-07-09 NOTE — Addendum Note (Signed)
Addended by: Michaela Corner on: 07/09/2016 11:46 AM   Modules accepted: Orders

## 2016-07-09 NOTE — Progress Notes (Signed)
BP 113/61   Pulse 73   Temp (!) 96.5 F (35.8 C) (Oral)   Ht 5' 1.5" (1.562 m)   Wt 137 lb (62.1 kg)   BMI 25.47 kg/m    Subjective:    Patient ID: [name] [name], female    DOB: Jul 24, 1931, 83 y.o.   MRN: [id number]  HPI: [name] [name] is a 83 y.o. female presenting on 07/09/2016 for Pain in rib area, left side (began in January, thinks she may have done something when coughing); Cough; and Sinusitis   HPI Left-sided rib pain and coughing and sinus congestion Patient has been having left-sided rib pain and coughing and sinus congestion that has been going on for the past month and a half. She says her coughing and congestion and sinus congestion has worsened over the past week. She says the left-sided rib pain has also worsened over the past week because her cough is increased. She feels like she first had the left-sided rib pain back in January when she was having a lot of coughing then and all of a sudden it was hurting a lot. She says it has not been able to get much better because she has continued to have off and on spells where she is coughing a lot like this. She has used her Mucinex and Flonase intermittently and they helped some but she has not used them consistently.  Relevant past medical, surgical, family and social history reviewed and updated as indicated. Interim medical history since our last visit reviewed. Allergies and medications reviewed and updated.  Review of Systems  Constitutional: Negative for chills and fever.  HENT: Positive for congestion, postnasal drip, rhinorrhea, sinus pressure, sneezing and sore throat. Negative for ear discharge and ear pain.   Eyes: Negative for pain, redness and visual disturbance.  Respiratory: Positive for cough and wheezing. Negative for chest tightness and shortness of breath.   Cardiovascular: Positive for chest pain. Negative for leg swelling.  Genitourinary: Negative for difficulty urinating and dysuria.    Musculoskeletal: Negative for back pain and gait problem.  Skin: Negative for rash.  Neurological: Negative for light-headedness and headaches.  Psychiatric/Behavioral: Negative for agitation and behavioral problems.  All other systems reviewed and are negative.   Per HPI unless specifically indicated above     Objective:    BP 113/61   Pulse 73   Temp (!) 96.5 F (35.8 C) (Oral)   Ht 5' 1.5" (1.562 m)   Wt 137 lb (62.1 kg)   BMI 25.47 kg/m   Wt Readings from Last 3 Encounters:  07/09/16 137 lb (62.1 kg)  05/07/16 137 lb (62.1 kg)  04/15/16 134 lb (60.8 kg)    Physical Exam  Constitutional: She is oriented to person, place, and time. She appears well-developed and well-nourished. No distress.  HENT:  Right Ear: Tympanic membrane, external ear and ear canal normal.  Left Ear: Tympanic membrane, external ear and ear canal normal.  Nose: Mucosal edema and rhinorrhea present. No epistaxis. Right sinus exhibits no maxillary sinus tenderness and no frontal sinus tenderness. Left sinus exhibits no maxillary sinus tenderness and no frontal sinus tenderness.  Mouth/Throat: Uvula is midline and mucous membranes are normal. Posterior oropharyngeal edema and posterior oropharyngeal erythema present. No oropharyngeal exudate or tonsillar abscesses.  Eyes: Conjunctivae and EOM are normal.  Cardiovascular: Normal rate, regular rhythm, normal heart sounds and intact distal pulses.   No murmur heard. Pulmonary/Chest: Effort normal and breath sounds normal. No respiratory distress. She has  no wheezes. She has no rales. She exhibits tenderness (Lower left rib chest wall tenderness, along midaxillary line).  Musculoskeletal: Normal range of motion. She exhibits no edema or tenderness.  Neurological: She is alert and oriented to person, place, and time. Coordination normal.  Skin: Skin is warm and dry. No rash noted. She is not diaphoretic.  Psychiatric: She has a normal mood and affect. Her  behavior is normal.  Nursing note and vitals reviewed.     Assessment & Plan:   Problem List Items Addressed This Visit    None    Visit Diagnoses    COPD exacerbation ([place])    -  Primary   Relevant Medications   predniSONE (DELTASONE) 20 MG tablet   baclofen (LIORESAL) 10 MG tablet   cephALEXin (KEFLEX) 500 MG capsule   Costochondritis, acute       Left chest wall, likely from coughing, will send prednisone and Keflex and Bactrim   Relevant Medications   baclofen (LIORESAL) 10 MG tablet       Follow up plan: Return if symptoms worsen or fail to improve.  Counseling provided for all of the vaccine components No orders of the defined types were placed in this encounter.   Caryl Pina, MD [facility] 07/09/2016, 11:33 AM

## 2016-07-22 ENCOUNTER — Other Ambulatory Visit: Payer: Self-pay | Admitting: Family Medicine

## 2016-07-29 DIAGNOSIS — E119 Type 2 diabetes mellitus without complications: Secondary | ICD-10-CM | POA: Diagnosis not present

## 2016-08-06 ENCOUNTER — Telehealth: Payer: Self-pay | Admitting: Family Medicine

## 2016-08-07 NOTE — Telephone Encounter (Signed)
Pt aware of appt.

## 2016-08-14 ENCOUNTER — Other Ambulatory Visit: Payer: Self-pay | Admitting: Family Medicine

## 2016-08-18 ENCOUNTER — Other Ambulatory Visit: Payer: Self-pay | Admitting: Family Medicine

## 2016-08-24 ENCOUNTER — Other Ambulatory Visit: Payer: Self-pay | Admitting: Family Medicine

## 2016-08-26 ENCOUNTER — Other Ambulatory Visit: Payer: Medicare Other

## 2016-08-26 DIAGNOSIS — E7800 Pure hypercholesterolemia, unspecified: Secondary | ICD-10-CM | POA: Diagnosis not present

## 2016-08-26 DIAGNOSIS — I10 Essential (primary) hypertension: Secondary | ICD-10-CM

## 2016-08-26 DIAGNOSIS — E559 Vitamin D deficiency, unspecified: Secondary | ICD-10-CM

## 2016-08-26 DIAGNOSIS — E118 Type 2 diabetes mellitus with unspecified complications: Secondary | ICD-10-CM | POA: Diagnosis not present

## 2016-08-26 LAB — BAYER DCA HB A1C WAIVED: HB A1C: 5.9 % (ref <7.0)

## 2016-08-27 LAB — CBC WITH DIFFERENTIAL/PLATELET
Basophils Absolute: 0 10*3/uL (ref 0.0–0.2)
Basos: 0 %
EOS (ABSOLUTE): 0.1 10*3/uL (ref 0.0–0.4)
EOS: 2 %
HEMOGLOBIN: 14.6 g/dL (ref 11.1–15.9)
Hematocrit: 43.1 % (ref 34.0–46.6)
IMMATURE GRANS (ABS): 0 10*3/uL (ref 0.0–0.1)
IMMATURE GRANULOCYTES: 0 %
Lymphocytes Absolute: 2 10*3/uL (ref 0.7–3.1)
Lymphs: 30 %
MCH: 30.7 pg (ref 26.6–33.0)
MCHC: 33.9 g/dL (ref 31.5–35.7)
MCV: 91 fL (ref 79–97)
Monocytes Absolute: 0.5 10*3/uL (ref 0.1–0.9)
Monocytes: 7 %
Neutrophils Absolute: 4.1 10*3/uL (ref 1.4–7.0)
Neutrophils: 61 %
Platelets: 281 10*3/uL (ref 150–379)
RBC: 4.75 x10E6/uL (ref 3.77–5.28)
RDW: 13.9 % (ref 12.3–15.4)
WBC: 6.7 10*3/uL (ref 3.4–10.8)

## 2016-08-27 LAB — HEPATIC FUNCTION PANEL
ALBUMIN: 4.4 g/dL (ref 3.5–4.7)
ALT: 13 IU/L (ref 0–32)
AST: 19 IU/L (ref 0–40)
Alkaline Phosphatase: 76 IU/L (ref 39–117)
BILIRUBIN TOTAL: 0.8 mg/dL (ref 0.0–1.2)
BILIRUBIN, DIRECT: 0.22 mg/dL (ref 0.00–0.40)
Total Protein: 6.7 g/dL (ref 6.0–8.5)

## 2016-08-27 LAB — BMP8+EGFR
BUN / CREAT RATIO: 15 (ref 12–28)
BUN: 11 mg/dL (ref 8–27)
CALCIUM: 10 mg/dL (ref 8.7–10.3)
CHLORIDE: 97 mmol/L (ref 96–106)
CO2: 24 mmol/L (ref 18–29)
Creatinine, Ser: 0.75 mg/dL (ref 0.57–1.00)
GFR calc Af Amer: 86 mL/min/{1.73_m2} (ref >59)
GFR calc non Af Amer: 74 mL/min/{1.73_m2} (ref >59)
GLUCOSE: 100 mg/dL — AB (ref 65–99)
Potassium: 5.1 mmol/L (ref 3.5–5.2)
Sodium: 137 mmol/L (ref 134–144)

## 2016-08-27 LAB — LIPID PANEL
CHOL/HDL RATIO: 2.3 ratio (ref 0.0–4.4)
Cholesterol, Total: 138 mg/dL (ref 100–199)
HDL: 60 mg/dL (ref >39)
LDL Calculated: 65 mg/dL (ref 0–99)
Triglycerides: 65 mg/dL (ref 0–149)
VLDL Cholesterol Cal: 13 mg/dL (ref 5–40)

## 2016-08-27 LAB — VITAMIN D 25 HYDROXY (VIT D DEFICIENCY, FRACTURES): Vit D, 25-Hydroxy: 51.3 ng/mL (ref 30.0–100.0)

## 2016-08-28 ENCOUNTER — Encounter: Payer: Self-pay | Admitting: Family Medicine

## 2016-08-28 ENCOUNTER — Ambulatory Visit (INDEPENDENT_AMBULATORY_CARE_PROVIDER_SITE_OTHER): Payer: Medicare Other | Admitting: Family Medicine

## 2016-08-28 VITALS — BP 96/59 | HR 56 | Temp 97.0°F | Ht 61.5 in | Wt 135.0 lb

## 2016-08-28 DIAGNOSIS — E118 Type 2 diabetes mellitus with unspecified complications: Secondary | ICD-10-CM

## 2016-08-28 DIAGNOSIS — E7800 Pure hypercholesterolemia, unspecified: Secondary | ICD-10-CM

## 2016-08-28 DIAGNOSIS — N183 Type 2 diabetes mellitus with diabetic chronic kidney disease: Secondary | ICD-10-CM

## 2016-08-28 DIAGNOSIS — E559 Vitamin D deficiency, unspecified: Secondary | ICD-10-CM | POA: Diagnosis not present

## 2016-08-28 DIAGNOSIS — I10 Essential (primary) hypertension: Secondary | ICD-10-CM

## 2016-08-28 DIAGNOSIS — E1169 Type 2 diabetes mellitus with other specified complication: Secondary | ICD-10-CM

## 2016-08-28 DIAGNOSIS — E1122 Type 2 diabetes mellitus with diabetic chronic kidney disease: Secondary | ICD-10-CM

## 2016-08-28 DIAGNOSIS — R0789 Other chest pain: Secondary | ICD-10-CM

## 2016-08-28 DIAGNOSIS — E785 Hyperlipidemia, unspecified: Secondary | ICD-10-CM

## 2016-08-28 DIAGNOSIS — J449 Chronic obstructive pulmonary disease, unspecified: Secondary | ICD-10-CM

## 2016-08-28 MED ORDER — ALBUTEROL SULFATE HFA 108 (90 BASE) MCG/ACT IN AERS: 2.0000 | INHALATION_SPRAY | Freq: Four times a day (QID) | RESPIRATORY_TRACT | 6 refills | Status: DC | PRN

## 2016-08-28 NOTE — Progress Notes (Signed)
Subjective:    Patient ID: [name] [name], female    DOB: 01-06-1932, 83 y.o.   MRN: [id number]  HPI Pt here for follow up and management of chronic medical problems which includes hypertension, hyperlipidemia and diabetes. She is taking medication regularly.this patient comes to the visit today with her daughter. She saw the eye doctor in March of this year. She complains today with head congestion and nasal drainage. She also has a bruise of her left hip. She has had lab work done and we will review this with her during the visit today. All cholesterol numbers were excellent and at goal. The blood sugar was slightly elevated at 100 but the A1c was excellent at 5.9%. The creatinine, the most important kidney function test was within normal limits. All the electrolytes including potassium are good. The CBC has a normal white blood cell count and excellent hemoglobin and adequate platelet count. All liver function tests were within normal limits the vitamin D level was good at 51.3. These numbers will be reviewed with her during the visit today and she'll be given a copy of the report. She also brings in blood sugars for review and all of these were at 125 or less. A few more recently have been more elevated but she is once again to be applauded for her overall control. These readings will be scanned into the record. The patient denies any chest pain or shortness of breath. She did complain of some left-sided chest pain but it is better and this was secondary to coughing back in the wintertime. She denies any more shortness of breath than usual. She denies any trouble with nausea vomiting diarrhea blood in the stool or black tarry bowel movements. She is passing her water without problems. She does have a lot of nasal congestion and she has a long history of allergies and bronchitis secondary to the allergies. The daughter was with her and reminded [place] that she needs a refill on her rescue inhaler she does  have Symbicort at home. She is using Flonase regularly and nasal saline and Mucinex.  Patient Active Problem List   Diagnosis Date Noted  . Type 2 diabetes mellitus with hyperlipidemia ([place]) 09/26/2015  . COPD (chronic obstructive pulmonary disease) ([place]) 05/29/2015  . Type 2 diabetes mellitus, controlled ([place]) 08/30/2014  . Rectal bleeding 03/07/2014  . BRBPR (bright red blood per rectum) 03/07/2014  . History of total left hip arthroplasty 01/25/2014  . Elevated liver enzymes 01/13/2014  . Edema 01/02/2014  . Hypoxia 01/02/2014  . S/P total hip arthroplasty 12/27/2013  . Chronic cough 10/26/2013  . Hyperlipidemia   . Anxiety   . Depressive disorder, not elsewhere classified   . Rhinitis   . Atrophic vaginitis   . Postmenopausal    Outpatient Encounter Prescriptions as of 08/28/2016  Medication Sig  . ALPRAZolam (XANAX) 0.25 MG tablet TAKE ONE-HALF TABLET BY MOUTH FOUR TIMES DAILY AS NEEDED  . amLODipine (NORVASC) 2.5 MG tablet Take 1 Tablet by mouth once daily  . aspirin 81 MG tablet Take 81 mg by mouth every other day.  . blood glucose meter kit and supplies KIT Check BS once daily  . budesonide-formoterol (SYMBICORT) 160-4.5 MCG/ACT inhaler Inhale 2 puffs into the lungs 2 (two) times daily.  . calcium carbonate (OS-CAL) 600 MG TABS tablet Take 600 mg by mouth 2 (two) times daily with a meal.  . cholecalciferol (VITAMIN D) 1000 UNITS tablet Take 2,000 Units by mouth daily.  Marland Kitchen escitalopram (  LEXAPRO) 10 MG tablet Take 1 Tablet by mouth once daily as instructed  . fluticasone (FLONASE) 50 MCG/ACT nasal spray USE ONE SPRAY in each nostril ONCE daily  . gabapentin (NEURONTIN) 100 MG capsule TAKE ONE CAPSULE BY MOUTH at bedtime FOR FOUR nights, THEN ONE capsule TWICE DAILY FOR FOUR DAYS, THEN ONE THREE TIMES DAILY  . glucose blood (ONETOUCH VERIO) test strip Test Blood sugar four times daily  . glucose blood test strip Use to check BG twice a day and PRN  Dx:  E11.9  . lovastatin  (MEVACOR) 40 MG tablet Take 1 Tablet by mouth once daily  . metFORMIN (GLUCOPHAGE-XR) 500 MG 24 hr tablet Take 1 Tablet by mouth once daily with breakfast  . ONETOUCH DELICA LANCETS 74J MISC Use to check BG once a day to every other day Dx: E 11.9  . [DISCONTINUED] baclofen (LIORESAL) 10 MG tablet Take 1 tablet (10 mg total) by mouth 3 (three) times daily.  . [DISCONTINUED] cephALEXin (KEFLEX) 500 MG capsule Take 1 capsule (500 mg total) by mouth 4 (four) times daily.  . [DISCONTINUED] predniSONE (DELTASONE) 20 MG tablet 2 po at same time daily for 5 days   No facility-administered encounter medications on file as of 08/28/2016.       Review of Systems  Constitutional: Negative.   HENT: Positive for congestion (head ) and postnasal drip.   Eyes: Negative.   Respiratory: Negative.   Cardiovascular: Negative.   Gastrointestinal: Negative.   Endocrine: Negative.   Genitourinary: Negative.   Musculoskeletal: Negative.   Skin: Negative.        Bruise on left hip from fall  Allergic/Immunologic: Negative.   Neurological: Negative.   Hematological: Negative.   Psychiatric/Behavioral: Negative.        Objective:   Physical Exam  Constitutional: She is oriented to person, place, and time. She appears well-developed and well-nourished. No distress.  Patient is elderly small frame but pleasant and alert. Her mind is good.  HENT:  Head: Normocephalic and atraumatic.  Right Ear: External ear normal.  Left Ear: External ear normal.  Mouth/Throat: Oropharynx is clear and moist. No oropharyngeal exudate.  Nasal congestion  Eyes: Conjunctivae and EOM are normal. Pupils are equal, round, and reactive to light. Right eye exhibits no discharge. Left eye exhibits no discharge. No scleral icterus.  Neck: Normal range of motion. Neck supple. No thyromegaly present.  No bruits thyromegaly or anterior cervical adenopathy  Cardiovascular: Normal rate, regular rhythm, normal heart sounds and intact  distal pulses.   No murmur heard. 60/m with a regular rate and rhythm  Pulmonary/Chest: Effort normal and breath sounds normal. No respiratory distress. She has no wheezes. She has no rales.  Abdominal: Soft. Bowel sounds are normal. She exhibits no mass. There is no tenderness. There is no rebound and no guarding.  No abdominal tenderness masses or bruits  Genitourinary:  Genitourinary Comments: Breast check today was negative for lumps masses or axillary adenopathy  Musculoskeletal: Normal range of motion. She exhibits no edema or tenderness.  Lymphadenopathy:    She has no cervical adenopathy.  Neurological: She is alert and oriented to person, place, and time. She has normal reflexes. No cranial nerve deficit.  Skin: Skin is warm and dry. No rash noted.  Psychiatric: She has a normal mood and affect. Her behavior is normal. Judgment and thought content normal.  Nursing note and vitals reviewed.  BP (!) 96/59 (BP Location: Left Arm)   Pulse (!) 56  Temp 97 F (36.1 C) (Oral)   Ht 5' 1.5" (1.562 m)   Wt 135 lb (61.2 kg)   BMI 25.09 kg/m         Assessment & Plan:  1. Essential hypertension -The blood pressure is good today and the patient will continue with current treatment  2. Pure hypercholesterolemia -Cholesterol numbers were excellent and she will continue with current treatment  3. Vitamin D deficiency -Continue with vitamin D replacement  4. Controlled type 2 diabetes mellitus with complication, without long-term current use of insulin ([facility]) -The A1c was 5.9% and she will continue with current treatment and monitoring and aggressive therapeutic lifestyle changes  5. Chest wall pain -This is improved and resolving.  6. Chronic obstructive pulmonary disease, unspecified COPD type ([place]) -The patient is loaded with lots of allergies and they seem to start in the upper airways and moved to her chest. She will continue with the Flonase nasal saline and Mucinex and  we'll add Claritin 1/2-1 pill daily to see if this helps control her upper airway symptoms more. She was given a prescription today for a rescue inhaler and she will continue with her Symbicort.  7. Type 2 diabetes mellitus with hyperlipidemia ([facility]) -The hemoglobin A1c was excellent she will continue with current treatment  8. Controlled type 2 diabetes mellitus with stage 3 chronic kidney disease, without long-term current use of insulin ([place]) -This was reviewed today and her creatinine was good. We will continue to monitor this. She will always avoid NSAIDs like ibuprofen and Aleve.  No orders of the defined types were placed in this encounter.  Patient Instructions                       Medicare Annual Wellness Visit  [facility] and the medical providers at [facility] strive to bring you the best medical care.  In doing so we not only want to address your current medical conditions and concerns but also to detect new conditions early and prevent illness, disease and health-related problems.    Medicare offers a yearly Wellness Visit which allows our clinical staff to assess your need for preventative services including immunizations, lifestyle education, counseling to decrease risk of preventable diseases and screening for fall risk and other medical concerns.    This visit is provided free of charge (no copay) for all Medicare recipients. The clinical pharmacists at [facility] have begun to conduct these Wellness Visits which will also include a thorough review of all your medications.    As you primary medical provider recommend that you make an appointment for your Annual Wellness Visit if you have not done so already this year.  You may set up this appointment before you leave today or you may call back ([phone]) and schedule an appointment.  Please make sure when you call that you mention that you are scheduling your Annual Wellness Visit  with the clinical pharmacist so that the appointment may be made for the proper length of time.     Continue current medications. Continue good therapeutic lifestyle changes which include good diet and exercise. Fall precautions discussed with patient. If an FOBT was given today- please return it to our front desk. If you are over 48 years old - you may need Prevnar 63 or the adult Pneumonia vaccine.  **Flu shots are available--- please call and schedule a FLU-CLINIC appointment**  After your visit with [place] today you will receive  a survey in the mail or online from [employer] regarding your care with [place]. Please take a moment to fill this out. Your feedback is very important to [place] as you can help [place] better understand your patient needs as well as improve your experience and satisfaction. WE CARE ABOUT YOU!!!  Continue with Flonase nasal saline and Mucinex Continue with Symbicort We will call in a prescription for an albuterol rescue inhaler that you would need to use in case she develops any wheezing or bronchial cough Add Claritin 1/2-1 tablet daily is seeing if this helps the congestion that you're having from allergic rhinitis.   Arrie Senate MD

## 2016-08-28 NOTE — Addendum Note (Signed)
Addended by: Zannie Cove on: 08/28/2016 10:24 AM   Modules accepted: Orders

## 2016-08-28 NOTE — Patient Instructions (Addendum)
Medicare Annual Wellness Visit  [facility] and the medical providers at [facility] strive to bring you the best medical care.  In doing so we not only want to address your current medical conditions and concerns but also to detect new conditions early and prevent illness, disease and health-related problems.    Medicare offers a yearly Wellness Visit which allows our clinical staff to assess your need for preventative services including immunizations, lifestyle education, counseling to decrease risk of preventable diseases and screening for fall risk and other medical concerns.    This visit is provided free of charge (no copay) for all Medicare recipients. The clinical pharmacists at [facility] have begun to conduct these Wellness Visits which will also include a thorough review of all your medications.    As you primary medical provider recommend that you make an appointment for your Annual Wellness Visit if you have not done so already this year.  You may set up this appointment before you leave today or you may call back ([phone]) and schedule an appointment.  Please make sure when you call that you mention that you are scheduling your Annual Wellness Visit with the clinical pharmacist so that the appointment may be made for the proper length of time.     Continue current medications. Continue good therapeutic lifestyle changes which include good diet and exercise. Fall precautions discussed with patient. If an FOBT was given today- please return it to our front desk. If you are over 31 years old - you may need Prevnar 61 or the adult Pneumonia vaccine.  **Flu shots are available--- please call and schedule a FLU-CLINIC appointment**  After your visit with [place] today you will receive a survey in the mail or online from [employer] regarding your care with [place]. Please take a moment to fill this out. Your feedback is very  important to [place] as you can help [place] better understand your patient needs as well as improve your experience and satisfaction. WE CARE ABOUT YOU!!!  Continue with Flonase nasal saline and Mucinex Continue with Symbicort We will call in a prescription for an albuterol rescue inhaler that you would need to use in case she develops any wheezing or bronchial cough Add Claritin 1/2-1 tablet daily is seeing if this helps the congestion that you're having from allergic rhinitis.

## 2016-09-01 ENCOUNTER — Telehealth: Payer: Self-pay | Admitting: Family Medicine

## 2016-09-01 NOTE — Telephone Encounter (Signed)
Spoke with pt regarding elevated BS Pt had frosty the night before , biscuit and fried chicken before checking CBG Informed pt that these foods raise BS She will monitor diet more closely and continue to check BS

## 2016-09-02 ENCOUNTER — Telehealth: Payer: Self-pay | Admitting: Family Medicine

## 2016-09-02 ENCOUNTER — Ambulatory Visit (INDEPENDENT_AMBULATORY_CARE_PROVIDER_SITE_OTHER): Payer: Medicare Other | Admitting: Pharmacist

## 2016-09-02 DIAGNOSIS — E118 Type 2 diabetes mellitus with unspecified complications: Secondary | ICD-10-CM

## 2016-09-02 DIAGNOSIS — R7309 Other abnormal glucose: Secondary | ICD-10-CM

## 2016-09-02 LAB — GLUCOSE HEMOCUE WAIVED: Glu Hemocue Waived: 121 mg/dL — ABNORMAL HIGH (ref 65–99)

## 2016-09-02 MED ORDER — ONETOUCH DELICA LANCING DEV MISC: 0 refills | Status: DC

## 2016-09-02 NOTE — Telephone Encounter (Signed)
Patient checked BG one meter and was 200.  Checked with another glucometer and was 109.  Patient to come in today - will have lab check BG and also check with her meters.

## 2016-09-02 NOTE — Patient Instructions (Signed)
Goal Blood glucose:    Fasting (before meals) = 80 to 130   Within 2 hours of eating = less than 180  Try to have no more than 3 of these foods per meal:  Fruit:   1/2 cup or once piece (baseball size)- apples, pears, pineapple, peaches, oranges  1 cup - berries, melons  1/2 banana or grapefruit  Stachy Vegetables:   1/2 cup potatoes (white or sweet), corn, peas, beans  Other starches:   1 piece of bread  1/2 cup rice or pasta  4 inch pancake    These foods you can eat more freely: Proteins:   Fish  Chicken or [place]  Beef or pork (1 or 2 servings per week)  Eggs  Nuts (peanuts, walnuts, almonds, pistachios)  Cheese  Non starchy vegetables:  Green beans  Broccoli or cauliflower  Lettuce, greens, cabbage  Brussel Sprout  Carrots  Onions and peppers  Celery  Tomatoes  Asparagus  Eggplant  Cucumbers  Squash and Zucchini

## 2016-09-02 NOTE — Progress Notes (Signed)
   Subjective:    Patient ID: [name] [name], female    DOB: Jul 06, 1931, 83 y.o.   MRN: [id number]  Diabetes  She presents for her follow-up diabetic visit. She has type 2 diabetes mellitus. Her disease course has been stable. There are no hypoglycemic associated symptoms. There are no hypoglycemic complications. Current diabetic treatment includes oral agent (monotherapy). She is compliant with treatment all of the time. She is following a diabetic diet. Meal planning includes avoidance of concentrated sweets and carbohydrate counting. She has had a previous visit with a dietitian.   A1c last week was 5.9% however patient states that she had dental procedure last Friday and was unable to eat so she has a large milkshake.  BG increased to 200 on Saturday.  Last night and this morning she checked her BG on two different glucometers and got numbers that were about 100 points different.  She is very flustered today in office because of differing number.  Patient bring in both glucometers - One Touch Verio and Relion.  She brought strips for One Touch but only has one strip which appeared to have been used for the Relion meter.  She also has been storing strips for Relion meter in the carrying which may affect how reliable results are.  Patient is also in need of new lacing device as the one she is using is broken  Review of Systems     Objective:   Physical Exam   RBG = 121 in office today by our lab  RBG with her glucometer was 137      Assessment & Plan:   Type 2 DM, controlled but experiencing variable BG readings from gluco meters  Reviewed glucometer / BG checking technique.  - wash hands with soap and warm water -if you use alcohol swab make sure to let dry - use second blood drop for test (wipe aware first drop) - make sure to fill the testing area completely with blood - keep strips in original container as moisture might affect results / performance.  - BG checked in  different meters might vary by about 10 to 15 %  Rx sent in for new delica lancing device.   Reviewed CHO counting diet and serving sizes

## 2016-09-03 DIAGNOSIS — E119 Type 2 diabetes mellitus without complications: Secondary | ICD-10-CM | POA: Diagnosis not present

## 2016-09-10 ENCOUNTER — Ambulatory Visit: Payer: Medicare Other | Admitting: Family Medicine

## 2016-10-05 ENCOUNTER — Other Ambulatory Visit: Payer: Self-pay

## 2016-10-05 NOTE — Telephone Encounter (Signed)
Patients PCP [facility]  [facility] sent this over  NOt on patients list of meds  They said originally prescribed by Marline Backbone

## 2016-10-06 MED ORDER — KETOCONAZOLE 2 % EX SHAM: 1.0000 "application " | MEDICATED_SHAMPOO | CUTANEOUS | 0 refills | Status: DC

## 2016-10-12 ENCOUNTER — Other Ambulatory Visit: Payer: Self-pay | Admitting: Family Medicine

## 2016-10-13 ENCOUNTER — Other Ambulatory Visit: Payer: Self-pay | Admitting: Family Medicine

## 2016-10-15 DIAGNOSIS — E119 Type 2 diabetes mellitus without complications: Secondary | ICD-10-CM | POA: Diagnosis not present

## 2016-10-16 ENCOUNTER — Other Ambulatory Visit: Payer: Self-pay | Admitting: *Deleted

## 2016-10-16 MED ORDER — GLUCOSE BLOOD VI STRP: ORAL_STRIP | 2 refills | Status: DC

## 2016-10-26 ENCOUNTER — Other Ambulatory Visit: Payer: Self-pay | Admitting: Family Medicine

## 2016-10-29 ENCOUNTER — Ambulatory Visit (INDEPENDENT_AMBULATORY_CARE_PROVIDER_SITE_OTHER): Payer: Medicare Other | Admitting: Physician Assistant

## 2016-10-29 ENCOUNTER — Encounter: Payer: Self-pay | Admitting: Physician Assistant

## 2016-10-29 VITALS — BP 126/86 | HR 59 | Temp 97.2°F | Ht 61.5 in | Wt 137.0 lb

## 2016-10-29 DIAGNOSIS — L509 Urticaria, unspecified: Secondary | ICD-10-CM | POA: Diagnosis not present

## 2016-10-29 DIAGNOSIS — L989 Disorder of the skin and subcutaneous tissue, unspecified: Secondary | ICD-10-CM | POA: Diagnosis not present

## 2016-10-29 MED ORDER — LORATADINE 10 MG PO TABS: 10.0000 mg | ORAL_TABLET | Freq: Every day | ORAL | 11 refills | Status: DC

## 2016-10-29 MED ORDER — HYDROCORTISONE 1 % EX OINT: 1.0000 "application " | TOPICAL_OINTMENT | Freq: Two times a day (BID) | CUTANEOUS | 0 refills | Status: DC

## 2016-10-29 MED ORDER — PREDNISONE 10 MG (21) PO TBPK: ORAL_TABLET | ORAL | 0 refills | Status: DC

## 2016-10-29 NOTE — Progress Notes (Signed)
BP 126/86   Pulse (!) 59   Temp (!) 97.2 F (36.2 C) (Oral)   Ht 5' 1.5" (1.562 m)   Wt 137 lb (62.1 kg)   BMI 25.47 kg/m    Subjective:    Patient ID: [name] [name], female    DOB: Sep 14, 1931, 83 y.o.   MRN: [id number]  HPI: [name] [name] is a 83 y.o. female presenting on 10/29/2016 for Rash (Back, legs and arms)  This patient comes in with complaints of multiple skin lesions. It started out with small pink lesions that itched a lot. She was concerned about blood bites. But has continued to have lots of itching. No notes in the home has had any bites like her. She has not had a pet that have fleas. She even called the instrument 8 her to come and check her house and nothing could be found. She has lesions on her arms and trunk and a few on the legs probably 20 or less. One new one on her wrists feels like a hive. She states that this is how it starts and then she scratches it a lot and she states she even scratches it through the night.  Relevant past medical, surgical, family and social history reviewed and updated as indicated. Allergies and medications reviewed and updated.  Past Medical History:  Diagnosis Date  . Anxiety   . Arthritis   . Atrophic vaginitis   . Cataract   . COPD (chronic obstructive pulmonary disease) ([place])   . Depressive disorder, not elsewhere classified   . Diabetes mellitus without complication ([place])   . Disorder of bone and cartilage, unspecified   . Elevated blood sugar   . Emphysema lung ([place])   . Hypertension   . Other and unspecified hyperlipidemia   . Postmenopausal   . Rhinitis     Past Surgical History:  Procedure Laterality Date  . APPENDECTOMY  1957  . CATARACT EXTRACTION W/ INTRAOCULAR LENS IMPLANT Bilateral 2005   [place]  . COLONOSCOPY N/A 03/08/2014   Procedure: COLONOSCOPY;  Surgeon: Rogene Houston, MD;  Location: [facility];  Service: Endoscopy;  Laterality: N/A;  . EYE SURGERY    . TOTAL HIP ARTHROPLASTY Left 12/27/2013     Procedure: LEFT TOTAL HIP ARTHROPLASTY;  Surgeon: Newt Minion, MD;  Location: [facility];  Service: Orthopedics;  Laterality: Left;    Review of Systems  Constitutional: Negative.   HENT: Negative.   Eyes: Negative.   Respiratory: Negative.   Gastrointestinal: Negative.   Genitourinary: Negative.   Skin: Positive for color change, rash and wound.    Allergies as of 10/29/2016      Reactions   Lisinopril Cough   Chlordiazepoxide-clidinium    Can't remember   Clarithromycin    Can't remember   Erymax [erythromycin]    Can't remember   Evista [raloxifene Hydrochloride]    Numb feeling   Penicillins    Can't remember   Sertraline Hcl Other (See Comments)   Sweating and mouth irritation   Sulfa Antibiotics Nausea And Vomiting   Betadine [povidone Iodine] Rash   Hydrogen Peroxide Rash      Medication List       Accurate as of 10/29/16 12:32 PM. Always use your most recent med list.          albuterol 108 (90 Base) MCG/ACT inhaler Commonly known as:  PROVENTIL HFA;VENTOLIN HFA Inhale 2 puffs into the lungs every 6 (six) hours as needed for wheezing or  shortness of breath.   ALPRAZolam 0.25 MG tablet Commonly known as:  XANAX TAKE ONE-HALF TABLET BY MOUTH FOUR TIMES DAILY AS NEEDED   amLODipine 2.5 MG tablet Commonly known as:  NORVASC Take 1 Tablet by mouth once daily   aspirin 81 MG tablet Take 81 mg by mouth every other day.   blood glucose meter kit and supplies Kit Check BS once daily   budesonide-formoterol 160-4.5 MCG/ACT inhaler Commonly known as:  SYMBICORT Inhale 2 puffs into the lungs 2 (two) times daily.   calcium carbonate 600 MG Tabs tablet Commonly known as:  OS-CAL Take 600 mg by mouth 2 (two) times daily with a meal.   cholecalciferol 1000 units tablet Commonly known as:  VITAMIN D Take 2,000 Units by mouth daily.   escitalopram 10 MG tablet Commonly known as:  LEXAPRO Take 1 Tablet by mouth once daily as instructed   fluticasone 50  MCG/ACT nasal spray Commonly known as:  FLONASE USE ONE SPRAY in each nostril ONCE daily   gabapentin 100 MG capsule Commonly known as:  NEURONTIN take 1  Capsule by mouth 3 times daily   hydrocortisone 1 % ointment Apply 1 application topically 2 (two) times daily.   ketoconazole 2 % shampoo Commonly known as:  NIZORAL Apply 1 application topically 2 (two) times a week.   loratadine 10 MG tablet Commonly known as:  CLARITIN Take 1 tablet (10 mg total) by mouth daily.   lovastatin 40 MG tablet Commonly known as:  MEVACOR Take 1 Tablet by mouth once daily   metFORMIN 500 MG 24 hr tablet Commonly known as:  GLUCOPHAGE-XR Take 1 Tablet by mouth once daily with breakfast   ONE TOUCH DELICA LANCING DEV Misc Use to check BG up to twice a day. DX: E11.29 - type 2 DM   ONETOUCH DELICA LANCETS 35T Misc Use to check BG once a day to every other day Dx: E 11.9   ONETOUCH VERIO test strip Generic drug:  glucose blood USE ONE STRIP TO CHECK GLUCOSE TWICE DAILY AND AS NEEDED   glucose blood test strip Commonly known as:  ONETOUCH VERIO Test Blood sugar four times daily   predniSONE 10 MG (21) Tbpk tablet Commonly known as:  STERAPRED UNI-PAK 21 TAB As directed x 6 days          Objective:    BP 126/86   Pulse (!) 59   Temp (!) 97.2 F (36.2 C) (Oral)   Ht 5' 1.5" (1.562 m)   Wt 137 lb (62.1 kg)   BMI 25.47 kg/m   Allergies  Allergen Reactions  . Lisinopril Cough  . Chlordiazepoxide-Clidinium     Can't remember  . Clarithromycin     Can't remember  . Erymax [Erythromycin]     Can't remember  . Evista [Raloxifene Hydrochloride]     Numb feeling  . Penicillins     Can't remember  . Sertraline Hcl Other (See Comments)    Sweating and mouth irritation   . Sulfa Antibiotics Nausea And Vomiting  . Betadine [Povidone Iodine] Rash  . Hydrogen Peroxide Rash    Physical Exam  Constitutional: She is oriented to person, place, and time. She appears well-developed  and well-nourished.  HENT:  Head: Normocephalic and atraumatic.  Eyes: Pupils are equal, round, and reactive to light. Conjunctivae and EOM are normal.  Cardiovascular: Normal rate, regular rhythm, normal heart sounds and intact distal pulses.   Pulmonary/Chest: Effort normal and breath sounds normal.  Abdominal: Soft.  Bowel sounds are normal.  Neurological: She is alert and oriented to person, place, and time. She has normal reflexes.  Skin: Skin is warm and dry. No rash noted.  Multiple flat lesions that are healing on the trunk and arms The lesion on the wrist that is slightly swollen and urticarial. legs are clear  Psychiatric: She has a normal mood and affect. Her behavior is normal. Judgment and thought content normal.    Results for orders placed or performed in visit on 09/02/16  Glucose Hemocue Waived  Result Value Ref Range   Glu Hemocue Waived 121 (H) 65 - 99 mg/dL      Assessment & Plan:   1. Hives - predniSONE (STERAPRED UNI-PAK 21 TAB) 10 MG (21) TBPK tablet; As directed x 6 days  Dispense: 21 tablet; Refill: 0 - loratadine (CLARITIN) 10 MG tablet; Take 1 tablet (10 mg total) by mouth daily.  Dispense: 30 tablet; Refill: 11 - hydrocortisone 1 % ointment; Apply 1 application topically 2 (two) times daily.  Dispense: 30 g; Refill: 0  2. Skin lesion of back - predniSONE (STERAPRED UNI-PAK 21 TAB) 10 MG (21) TBPK tablet; As directed x 6 days  Dispense: 21 tablet; Refill: 0 - hydrocortisone 1 % ointment; Apply 1 application topically 2 (two) times daily.  Dispense: 30 g; Refill: 0   Current Outpatient Prescriptions:  .  albuterol (PROVENTIL HFA;VENTOLIN HFA) 108 (90 Base) MCG/ACT inhaler, Inhale 2 puffs into the lungs every 6 (six) hours as needed for wheezing or shortness of breath., Disp: 1 Inhaler, Rfl: 6 .  ALPRAZolam (XANAX) 0.25 MG tablet, TAKE ONE-HALF TABLET BY MOUTH FOUR TIMES DAILY AS NEEDED, Disp: 60 tablet, Rfl: 1 .  amLODipine (NORVASC) 2.5 MG tablet, Take 1  Tablet by mouth once daily, Disp: 90 tablet, Rfl: 0 .  aspirin 81 MG tablet, Take 81 mg by mouth every other day., Disp: , Rfl:  .  blood glucose meter kit and supplies KIT, Check BS once daily, Disp: 1 each, Rfl: 0 .  budesonide-formoterol (SYMBICORT) 160-4.5 MCG/ACT inhaler, Inhale 2 puffs into the lungs 2 (two) times daily., Disp: 1 Inhaler, Rfl: 0 .  calcium carbonate (OS-CAL) 600 MG TABS tablet, Take 600 mg by mouth 2 (two) times daily with a meal., Disp: , Rfl:  .  cholecalciferol (VITAMIN D) 1000 UNITS tablet, Take 2,000 Units by mouth daily., Disp: , Rfl:  .  escitalopram (LEXAPRO) 10 MG tablet, Take 1 Tablet by mouth once daily as instructed, Disp: 30 tablet, Rfl: 1 .  fluticasone (FLONASE) 50 MCG/ACT nasal spray, USE ONE SPRAY in each nostril ONCE daily, Disp: 16 g, Rfl: 4 .  gabapentin (NEURONTIN) 100 MG capsule, take 1  Capsule by mouth 3 times daily, Disp: 90 capsule, Rfl: 0 .  glucose blood (ONETOUCH VERIO) test strip, Test Blood sugar four times daily, Disp: 100 each, Rfl: 2 .  ketoconazole (NIZORAL) 2 % shampoo, Apply 1 application topically 2 (two) times a week., Disp: 120 mL, Rfl: 0 .  Lancet Devices (ONE TOUCH DELICA LANCING DEV) MISC, Use to check BG up to twice a day. DX: E11.29 - type 2 DM, Disp: 1 each, Rfl: 0 .  lovastatin (MEVACOR) 40 MG tablet, Take 1 Tablet by mouth once daily, Disp: 30 tablet, Rfl: 5 .  metFORMIN (GLUCOPHAGE-XR) 500 MG 24 hr tablet, Take 1 Tablet by mouth once daily with breakfast, Disp: 90 tablet, Rfl: 0 .  ONETOUCH DELICA LANCETS 40J MISC, Use to check BG once a day  to every other day Dx: E 11.9, Disp: 100 each, Rfl: 3 .  ONETOUCH VERIO test strip, USE ONE STRIP TO CHECK GLUCOSE TWICE DAILY AND AS NEEDED, Disp: 100 each, Rfl: 1 .  hydrocortisone 1 % ointment, Apply 1 application topically 2 (two) times daily., Disp: 30 g, Rfl: 0 .  loratadine (CLARITIN) 10 MG tablet, Take 1 tablet (10 mg total) by mouth daily., Disp: 30 tablet, Rfl: 11 .  predniSONE  (STERAPRED UNI-PAK 21 TAB) 10 MG (21) TBPK tablet, As directed x 6 days, Disp: 21 tablet, Rfl: 0  Continue all other maintenance medications as listed above.  Follow up plan: Return if symptoms worsen or fail to improve.  Educational handout given for [place] PA-C [place] [address]  [place], [place] [address] [phone]   10/29/2016, 12:32 PM

## 2016-10-29 NOTE — Patient Instructions (Signed)
In a few days you may receive a survey in the mail or online from [employer] regarding your visit with us today. Please take a moment to fill this out. Your feedback is very important to our whole office. It can help us better understand your needs as well as improve your experience and satisfaction. Thank you for taking your time to complete it. We care about you.  Ceniyah Thorp, PA-C  

## 2016-11-03 ENCOUNTER — Other Ambulatory Visit: Payer: Self-pay | Admitting: Family Medicine

## 2016-11-04 NOTE — Telephone Encounter (Signed)
Last seen 10/29/16  Dorna Mai PCP  If approved route to nurse to call into [facility]

## 2016-11-26 ENCOUNTER — Other Ambulatory Visit: Payer: Medicare Other

## 2016-11-26 ENCOUNTER — Ambulatory Visit (INDEPENDENT_AMBULATORY_CARE_PROVIDER_SITE_OTHER): Payer: Medicare Other | Admitting: Pharmacist

## 2016-11-26 ENCOUNTER — Encounter: Payer: Self-pay | Admitting: Pharmacist

## 2016-11-26 ENCOUNTER — Telehealth: Payer: Self-pay | Admitting: Family Medicine

## 2016-11-26 VITALS — BP 124/68 | HR 60 | Ht 62.0 in | Wt 138.0 lb

## 2016-11-26 DIAGNOSIS — E119 Type 2 diabetes mellitus without complications: Secondary | ICD-10-CM

## 2016-11-26 DIAGNOSIS — I10 Essential (primary) hypertension: Secondary | ICD-10-CM | POA: Diagnosis not present

## 2016-11-26 DIAGNOSIS — Z0000 Encounter for general adult medical examination without abnormal findings: Secondary | ICD-10-CM

## 2016-11-26 DIAGNOSIS — E7800 Pure hypercholesterolemia, unspecified: Secondary | ICD-10-CM | POA: Diagnosis not present

## 2016-11-26 LAB — BAYER DCA HB A1C WAIVED: HB A1C: 6.3 % (ref <7.0)

## 2016-11-26 NOTE — Telephone Encounter (Signed)
Patient wanted suggestion of what she can use for scar on her neck.  Recommend Mederma - apply to area 1 to 2 times a day.

## 2016-11-26 NOTE — Patient Instructions (Addendum)
[name] , Thank you for taking time to come for your Medicare Wellness Visit. I appreciate your ongoing commitment to your health goals. Please review the following plan we discussed and let me know if I can assist you in the future.   These are the goals we discussed:  Use your cane as much as possible to prevent falls.  Continue to eat lots of non-starchy vegetables - carrots, green bean, squash, zucchini, tomatoes, onions, peppers, spinach and other green leafy vegetables, cabbage, lettuce, cucumbers, asparagus, okra (not fried), eggplant Limit sugar and processed foods (cakes, cookies, ice cream, crackers and chips) Increase fresh fruit but limit serving sizes 1/2 cup or about the size of tennis or baseball Limit red meat to no more than 1-2 times per week (serving size about the size of your palm) Choose whole grains / lean proteins - whole wheat bread, quinoa, whole grain rice (1/2 cup), fish, chicken, [place] Avoid sugar and calorie containing beverages - soda, sweet tea and juice.  Choose water or unsweetened tea instead.   This is a list of the screening recommended for you and due dates:  Health Maintenance  Topic Date Due  . Tetanus Vaccine  04/13/2016  . Flu Shot  11/11/2016  . Urine Protein Check  11/25/2016  . Mammogram  03/30/2017*  . Hemoglobin A1C  02/26/2017  . Eye exam for diabetics  06/11/2017  . Complete foot exam   08/28/2017  . DEXA scan (bone density measurement)  11/09/2018  . Pneumonia vaccines  Completed  *Topic was postponed. The date shown is not the original due date.   Fall Prevention in the Home Falls can cause injuries and can affect people from all age groups. There are many simple things that you can do to make your home safe and to help prevent falls. What can I do on the outside of my home?  Regularly repair the edges of walkways and driveways and fix any cracks.  Remove high doorway thresholds.  Trim any shrubbery on the main path into  your home.  Use bright outdoor lighting.  Clear walkways of debris and clutter, including tools and rocks.  Regularly check that handrails are securely fastened and in good repair. Both sides of any steps should have handrails.  Install guardrails along the edges of any raised decks or porches.  Have leaves, snow, and ice cleared regularly.  Use sand or salt on walkways during winter months.  In the garage, clean up any spills right away, including grease or oil spills. What can I do in the bathroom?  Use night lights.  Install grab bars by the toilet and in the tub and shower. Do not use towel bars as grab bars.  Use non-skid mats or decals on the floor of the tub or shower.  If you need to sit down while you are in the shower, use a plastic, non-slip stool.  Keep the floor dry. Immediately clean up any water that spills on the floor.  Remove soap buildup in the tub or shower on a regular basis.  Attach bath mats securely with double-sided non-slip rug tape.  Remove throw rugs and other tripping hazards from the floor. What can I do in the bedroom?  Use night lights.  Make sure that a bedside light is easy to reach.  Do not use oversized bedding that drapes onto the floor.  Have a firm chair that has side arms to use for getting dressed.  Remove throw rugs and other  tripping hazards from the floor. What can I do in the kitchen?  Clean up any spills right away.  Avoid walking on wet floors.  Place frequently used items in easy-to-reach places.  If you need to reach for something above you, use a sturdy step stool that has a grab bar.  Keep electrical cables out of the way.  Do not use floor polish or wax that makes floors slippery. If you have to use wax, make sure that it is non-skid floor wax.  Remove throw rugs and other tripping hazards from the floor. What can I do in the stairways?  Do not leave any items on the stairs.  Make sure that there are  handrails on both sides of the stairs. Fix handrails that are broken or loose. Make sure that handrails are as long as the stairways.  Check any carpeting to make sure that it is firmly attached to the stairs. Fix any carpet that is loose or worn.  Avoid having throw rugs at the top or bottom of stairways, or secure the rugs with carpet tape to prevent them from moving.  Make sure that you have a light switch at the top of the stairs and the bottom of the stairs. If you do not have them, have them installed. What are some other fall prevention tips?  Wear closed-toe shoes that fit well and support your feet. Wear shoes that have rubber soles or low heels.  When you use a stepladder, make sure that it is completely opened and that the sides are firmly locked. Have someone hold the ladder while you are using it. Do not climb a closed stepladder.  Add color or contrast paint or tape to grab bars and handrails in your home. Place contrasting color strips on the first and last steps.  Use mobility aids as needed, such as canes, walkers, scooters, and crutches.  Turn on lights if it is dark. Replace any light bulbs that burn out.  Set up furniture so that there are clear paths. Keep the furniture in the same spot.  Fix any uneven floor surfaces.  Choose a carpet design that does not hide the edge of steps of a stairway.  Be aware of any and all pets.  Review your medicines with your healthcare provider. Some medicines can cause dizziness or changes in blood pressure, which increase your risk of falling. Talk with your health care provider about other ways that you can decrease your risk of falls. This may include working with a physical therapist or trainer to improve your strength, balance, and endurance. This information is not intended to replace advice given to you by your health care provider. Make sure you discuss any questions you have with your health care provider. Document Released:  03/20/2002 Document Revised: 08/27/2015 Document Reviewed: 05/04/2014 Elsevier Interactive Patient Education  2017 [employer].

## 2016-11-27 ENCOUNTER — Other Ambulatory Visit: Payer: Self-pay

## 2016-11-27 DIAGNOSIS — R748 Abnormal levels of other serum enzymes: Secondary | ICD-10-CM

## 2016-11-27 LAB — BMP8+EGFR
BUN / CREAT RATIO: 14 (ref 12–28)
BUN: 11 mg/dL (ref 8–27)
CALCIUM: 9.4 mg/dL (ref 8.7–10.3)
CHLORIDE: 101 mmol/L (ref 96–106)
CO2: 21 mmol/L (ref 20–29)
CREATININE: 0.77 mg/dL (ref 0.57–1.00)
GFR calc non Af Amer: 72 mL/min/{1.73_m2} (ref >59)
GFR, EST AFRICAN AMERICAN: 83 mL/min/{1.73_m2} (ref >59)
Glucose: 98 mg/dL (ref 65–99)
Potassium: 4.8 mmol/L (ref 3.5–5.2)
Sodium: 136 mmol/L (ref 134–144)

## 2016-11-27 LAB — LIPID PANEL
CHOLESTEROL TOTAL: 132 mg/dL (ref 100–199)
Chol/HDL Ratio: 2.5 ratio (ref 0.0–4.4)
HDL: 53 mg/dL (ref >39)
LDL CALC: 62 mg/dL (ref 0–99)
Triglycerides: 83 mg/dL (ref 0–149)
VLDL CHOLESTEROL CAL: 17 mg/dL (ref 5–40)

## 2016-11-27 LAB — MICROALBUMIN / CREATININE URINE RATIO
Creatinine, Urine: 48.6 mg/dL
Microalb/Creat Ratio: 6.6 mg/g creat (ref 0.0–30.0)
Microalbumin, Urine: 3.2 ug/mL

## 2016-11-27 NOTE — Progress Notes (Signed)
Patient ID: [name] [name], female   DOB: [birth year], 83 y.o.   MRN: [id number]    Subjective:   [name] [name] is a 83 y.o. female who presents for a subsequent Medicare Annual Wellness Visit.  Social History: Occupational history: retired; has worked at [employer] in past and she mentions today that she really loved working there and sometimes misses it. Marital history:  Widowed; currently lives alone.  She states that she has had an alarm system installed at home and that she feels much safer at home. She did decline to get life alert though. Has 2 daughters who are very supportive.  She attends church regularly.  Eats out with friends a few times per week.  Also enjoys reading. Alcohol/Tobacco/Substances: none   Current Medications (verified) Outpatient Encounter Prescriptions as of 11/26/2016  Medication Sig  . ALPRAZolam (XANAX) 0.25 MG tablet TAKE ONE-HALF TABLET BY MOUTH FOUR TIMES DAILY AS NEEDED  . amLODipine (NORVASC) 2.5 MG tablet Take 1 Tablet by mouth once daily  . aspirin 81 MG tablet Take 81 mg by mouth every other day.  . blood glucose meter kit and supplies KIT Check BS once daily  . budesonide-formoterol (SYMBICORT) 160-4.5 MCG/ACT inhaler Inhale 2 puffs into the lungs 2 (two) times daily.  . calcium carbonate (OS-CAL) 600 MG TABS tablet Take 600 mg by mouth 2 (two) times daily with a meal.  . cholecalciferol (VITAMIN D) 1000 UNITS tablet Take 2,000 Units by mouth daily.  Marland Kitchen escitalopram (LEXAPRO) 10 MG tablet Take 1 Tablet by mouth once daily as instructed  . fluticasone (FLONASE) 50 MCG/ACT nasal spray USE ONE SPRAY in each nostril ONCE daily  . gabapentin (NEURONTIN) 100 MG capsule take 1  Capsule by mouth 3 times daily (Patient taking differently: 1 capsule qhs)  . glucose blood (ONETOUCH VERIO) test strip Test Blood sugar four times daily  . hydrocortisone 1 % ointment Apply 1 application topically 2 (two) times daily.  Marland Kitchen ketoconazole (NIZORAL) 2 % shampoo Apply 1  application topically 2 (two) times a week.  Elmore Guise Devices (ONE TOUCH DELICA LANCING DEV) MISC Use to check BG up to twice a day. DX: E11.29 - type 2 DM  . lovastatin (MEVACOR) 40 MG tablet Take 1 Tablet by mouth once daily (Patient taking differently: Take 1 Tablet by mouth every other day)  . metFORMIN (GLUCOPHAGE-XR) 500 MG 24 hr tablet Take 1 Tablet by mouth once daily with breakfast  . ONETOUCH DELICA LANCETS 72C MISC Use to check BG once a day to every other day Dx: E 11.9  . ONETOUCH VERIO test strip USE ONE STRIP TO CHECK GLUCOSE TWICE DAILY AND AS NEEDED  . albuterol (PROVENTIL HFA;VENTOLIN HFA) 108 (90 Base) MCG/ACT inhaler Inhale 2 puffs into the lungs every 6 (six) hours as needed for wheezing or shortness of breath.  . [DISCONTINUED] loratadine (CLARITIN) 10 MG tablet Take 1 tablet (10 mg total) by mouth daily. (Patient not taking: Reported on 11/26/2016)  . [DISCONTINUED] predniSONE (STERAPRED UNI-PAK 21 TAB) 10 MG (21) TBPK tablet As directed x 6 days (Patient not taking: Reported on 11/26/2016)   No facility-administered encounter medications on file as of 11/26/2016.     Allergies (verified) Lisinopril; Chlordiazepoxide-clidinium; Clarithromycin; Erymax [erythromycin]; Penicillins; Betadine [povidone iodine]; Evista [raloxifene hydrochloride]; Hydrogen peroxide; Sertraline hcl; and Sulfa antibiotics   History: Past Medical History:  Diagnosis Date  . Anxiety   . Arthritis   . Atrophic vaginitis   . Cataract   . COPD (chronic  obstructive pulmonary disease) ([place])   . Depressive disorder, not elsewhere classified   . Diabetes mellitus without complication ([place])   . Disorder of bone and cartilage, unspecified   . Elevated blood sugar   . Emphysema lung ([place])   . Hypertension   . Other and unspecified hyperlipidemia   . Postmenopausal   . Rhinitis    Past Surgical History:  Procedure Laterality Date  . APPENDECTOMY  1957  . CATARACT EXTRACTION W/ INTRAOCULAR LENS  IMPLANT Bilateral 2005   [place]  . COLONOSCOPY N/A 03/08/2014   Procedure: COLONOSCOPY;  Surgeon: Rogene Houston, MD;  [facility];  Service: Endoscopy;  Laterality: N/A;  . EYE SURGERY    . TOTAL HIP ARTHROPLASTY Left 12/27/2013   Procedure: LEFT TOTAL HIP ARTHROPLASTY;  Surgeon: Newt Minion, MD;  Location: [facility];  Service: Orthopedics;  Laterality: Left;   Family History  Problem Relation Age of Onset  . Arthritis Mother   . COPD Father   . Diabetes Father   . Aneurysm Sister   . Diabetes Sister   . Diabetes Sister   . Hypertension Sister    Social History   Occupational History  . Not on file.   Social History Main Topics  . Smoking status: Never Smoker  . Smokeless tobacco: Never Used  . Alcohol use No  . Drug use: No  . Sexual activity: No    Do you feel safe at home?  Yes Are there smokers in your home (other than you)? No  Dietary issues and exercise activities: Current Exercise Habits: Home exercise routine, Type of exercise: walking, Time (Minutes): 10, Frequency (Times/Week): 5, Weekly Exercise (Minutes/Week): 50, Intensity: Mild  Current Dietary habits:  She is eating low CHO / low fat diet.  Patient is very vigilant to her diet due to diabetes.    Objective:    Today's Vitals   11/26/16 1050  BP: 124/68  Pulse: 60  Weight: 138 lb (62.6 kg)  Height: 5' 2" (1.575 m)  PainSc: 2   PainLoc: Shoulder   Body mass index is 25.24 kg/m.  Activities of Daily Living In your present state of health, do you have any difficulty performing the following activities: 11/26/2016  Hearing? Y  Vision? N  Difficulty concentrating or making decisions? N  Walking or climbing stairs? Y  Dressing or bathing? N  Doing errands, shopping? N  Preparing Food and eating ? N  Using the Toilet? N  In the past six months, have you accidently leaked urine? N  Do you have problems with loss of bowel control? N  Managing your Medications? N  Managing your Finances?  N  Housekeeping or managing your Housekeeping? N  Some recent data might be hidden     Cardiac Risk Factors include: advanced age (>49mn, >>50women);diabetes mellitus;dyslipidemia;hypertension  Depression Screen PHQ 2/9 Scores 11/26/2016 10/29/2016 08/28/2016 07/09/2016  PHQ - 2 Score 0 0 0 0  PHQ- 9 Score - - - -     Fall Risk Fall Risk  11/26/2016 10/29/2016 08/28/2016 08/28/2016 07/09/2016  Falls in the past year? Yes Yes Yes No No  Number falls in past yr: 2 or more 1 2 or more - -  Injury with Fall? Yes No No - -  Comment - - - - -  Risk Factor Category  High Fall Risk - - - -  Risk for fall due to : History of fall(s);Impaired balance/gait - - - -  Follow up Falls  prevention discussed;Falls evaluation completed Falls prevention discussed - - -   Assist Devices - reports she sometimes uses cane for ambulation Patient does not drive.  Cognitive Function: MMSE - Mini Mental State Exam 11/26/2016 11/26/2015 10/26/2014  Not completed: - - Refused  Orientation to time 5 5 -  Orientation to Place 5 5 -  Registration _0 Attention/ Calculation 5 4 -  Recall _1 Language- name 2 objects 2 2 -  Language- repeat 1 1 -  Language- follow 3 step command 3 3 -  Language- read & follow direction 1 1 -  Write a sentence 1 1 -  Copy design 1 0 -  Total score 30 27 -    Immunizations and Health Maintenance Immunization History  Administered Date(s) Administered  . Influenza Whole 12/12/2009  . Influenza, High Dose Seasonal PF 01/01/2016  . Influenza,inj,Quad PF,36+ Mos 02/01/2013, 12/29/2013, 01/10/2015  . Pneumococcal Conjugate-13 04/24/2013  . Pneumococcal Polysaccharide-23 01/12/2003  . Td 04/13/2006  . Zoster 03/13/2006   Health Maintenance Due  Topic Date Due  . TETANUS/TDAP  04/13/2016  . INFLUENZA VACCINE  11/11/2016  . URINE MICROALBUMIN  11/25/2016    Patient Care Team: Chipper Herb, MD as PCP - General (Family Medicine) Newt Minion, MD (Orthopedic  Surgery) Clent Jacks, MD (Ophthalmology)  Indicate any recent Medical Services you may have received from other than [facility] providers in the past year (date may be approximate).    Assessment:    Annual Wellness Visit    Screening Tests Health Maintenance  Topic Date Due  . TETANUS/TDAP  04/13/2016  . INFLUENZA VACCINE  11/11/2016  . URINE MICROALBUMIN  11/25/2016  . MAMMOGRAM  03/30/2017 (Originally 02/11/2011)  . HEMOGLOBIN A1C  05/29/2017  . OPHTHALMOLOGY EXAM  06/11/2017  . FOOT EXAM  08/28/2017  . DEXA SCAN  11/09/2018  . PNA vac Low Risk Adult  Completed        Plan:   During the course of the visit Shaniece was educated and counseled about the following appropriate screening and preventive services:   Vaccines to include Pneumoccal, Influenza, Td and Shingles - due Boostrix and Shingrix but patient declined due to cost  Colorectal cancer screening  Cardiovascular disease screening  Diabetes - well controlled - continue current therapy  Bone Denisty / Osteoporosis Screening - has had checked several times in past and last BMD was normal.  Recheck in 3 to 5 years from last DEXA  Mammogram - scheduled for December 08 2016  Glaucoma screening / Diabetic Eye Exam - UTD  Nutrition counseling - continue to follow current diet - weight is stable and DM controlle  Advanced Directives - UTD  Physical Activity - continue to walk daily  Declined referral to audiologist to have hearing checked  Has f/u with PCP 12/02/2016    Orders Placed This Encounter  Procedures  . Microalbumin / creatinine urine ratio      Patient Instructions (the written plan) were given to the patient.   Cherre Robins, PharmD   11/27/2016

## 2016-11-29 LAB — SPECIMEN STATUS REPORT

## 2016-11-29 LAB — HFP7+3AC
ALK PHOS: 70 IU/L (ref 39–117)
ALT: 13 IU/L (ref 0–32)
AST: 20 IU/L (ref 0–40)
Albumin: 4.1 g/dL (ref 3.5–4.7)
BILIRUBIN TOTAL: 0.4 mg/dL (ref 0.0–1.2)
BILIRUBIN, DIRECT: 0.13 mg/dL (ref 0.00–0.40)
Cholesterol, Total: 125 mg/dL (ref 100–199)
GGT: 11 IU/L (ref 0–60)
LDH: 174 IU/L (ref 119–226)
Total Protein: 6.3 g/dL (ref 6.0–8.5)

## 2016-12-02 ENCOUNTER — Encounter: Payer: Self-pay | Admitting: Family Medicine

## 2016-12-02 ENCOUNTER — Ambulatory Visit (INDEPENDENT_AMBULATORY_CARE_PROVIDER_SITE_OTHER): Payer: Medicare Other | Admitting: Family Medicine

## 2016-12-02 VITALS — BP 145/64 | HR 65 | Temp 97.0°F | Ht 62.0 in | Wt 141.0 lb

## 2016-12-02 DIAGNOSIS — L509 Urticaria, unspecified: Secondary | ICD-10-CM

## 2016-12-02 DIAGNOSIS — I10 Essential (primary) hypertension: Secondary | ICD-10-CM | POA: Diagnosis not present

## 2016-12-02 DIAGNOSIS — E559 Vitamin D deficiency, unspecified: Secondary | ICD-10-CM | POA: Diagnosis not present

## 2016-12-02 DIAGNOSIS — J449 Chronic obstructive pulmonary disease, unspecified: Secondary | ICD-10-CM

## 2016-12-02 DIAGNOSIS — E7800 Pure hypercholesterolemia, unspecified: Secondary | ICD-10-CM | POA: Diagnosis not present

## 2016-12-02 DIAGNOSIS — E119 Type 2 diabetes mellitus without complications: Secondary | ICD-10-CM | POA: Diagnosis not present

## 2016-12-02 NOTE — Patient Instructions (Addendum)
Medicare Annual Wellness Visit  [facility] and the medical providers at [facility] strive to bring you the best medical care.  In doing so we not only want to address your current medical conditions and concerns but also to detect new conditions early and prevent illness, disease and health-related problems.    Medicare offers a yearly Wellness Visit which allows our clinical staff to assess your need for preventative services including immunizations, lifestyle education, counseling to decrease risk of preventable diseases and screening for fall risk and other medical concerns.    This visit is provided free of charge (no copay) for all Medicare recipients. The clinical pharmacists at [facility] have begun to conduct these Wellness Visits which will also include a thorough review of all your medications.    As you primary medical provider recommend that you make an appointment for your Annual Wellness Visit if you have not done so already this year.  You may set up this appointment before you leave today or you may call back ([phone]) and schedule an appointment.  Please make sure when you call that you mention that you are scheduling your Annual Wellness Visit with the clinical pharmacist so that the appointment may be made for the proper length of time.     Continue current medications. Continue good therapeutic lifestyle changes which include good diet and exercise. Fall precautions discussed with patient. If an FOBT was given today- please return it to our front desk. If you are over 30 years old - you may need Prevnar 7 or the adult Pneumonia vaccine.  **Flu shots are available--- please call and schedule a FLU-CLINIC appointment**  After your visit with [place] today you will receive a survey in the mail or online from [employer] regarding your care with [place]. Please take a moment to fill this out. Your feedback is very  important to [place] as you can help [place] better understand your patient needs as well as improve your experience and satisfaction. WE CARE ABOUT YOU!!!   The patient should use her inhalers regularly and continue with Mucinex twice daily with a large glass of water and continue with her Flonase. She needs to continue to be careful not put herself at risk for falling She needs to drink plenty of fluids and stay well hydrated Keep feet checked regularly and if the problem with skin lesions continue please call us back and we'll make arrangements for her to see the dermatologist.

## 2016-12-02 NOTE — Progress Notes (Signed)
Subjective:    Patient ID: [name] [name], female    DOB: 05/18/1931, 83 y.o.   MRN: [id number]  HPI Pt here for follow up and management of chronic medical problems which includes hyperlipidemia, hypertension and diabetes. She is taking medication regularly.The patient is doing well overall. She does complain of some hives cough and left shoulder pain. She has had lab work done and this will be reviewed with her during the visit today. All of her cholesterol numbers were excellent and the LDL C was good at 62. The hemoglobin A1c was good at 6.3% below this was slightly increased from the past. The blood sugar itself was 98 with normal renal function and normal creatinine and normal electrolytes. All liver function tests were normal. The patient brings in blood sugars for review and the majority of these are excellent with blood checks before lunch being anywhere from 75 up to 104. Generally speaking during the day there are no higher than 140. These will be scanned into the record. The patient's daughter comes with her to the visit today. She is trying to get her mail and use paper so that they delivered to her house and not at the road. She was recently going to get her use paper and it had been cost in the ditch and she fell and this is when her left-sided chest pain and shoulder pain started which appears to be some better. She is also coughing a little bit more and he was reemphasized to her the importance of using her Symbicort regularly and her Flonase regularly. She also understands the importance of staying on the Mucinex twice daily with a large glass of water. She denies any chest pain or increased shortness of breath. She does have a cough and understands this is probably allergy related. She denies any trouble with her stomach including nausea vomiting diarrhea blood in the stool or black tarry bowel movements. She is passing her water without problems. She is up-to-date on her eye exams which  occur yearly in March.     Patient Active Problem List   Diagnosis Date Noted  . Type 2 diabetes mellitus with hyperlipidemia ([place]) 09/26/2015  . COPD (chronic obstructive pulmonary disease) ([place]) 05/29/2015  . Type 2 diabetes mellitus, controlled ([place]) 08/30/2014  . Rectal bleeding 03/07/2014  . History of total left hip arthroplasty 01/25/2014  . Elevated liver enzymes 01/13/2014  . Edema 01/02/2014  . Hypoxia 01/02/2014  . S/P total hip arthroplasty 12/27/2013  . Hyperlipidemia   . Anxiety   . Depressive disorder, not elsewhere classified   . Rhinitis   . Atrophic vaginitis   . Postmenopausal    Outpatient Encounter Prescriptions as of 12/02/2016  Medication Sig  . albuterol (PROVENTIL HFA;VENTOLIN HFA) 108 (90 Base) MCG/ACT inhaler Inhale 2 puffs into the lungs every 6 (six) hours as needed for wheezing or shortness of breath.  . ALPRAZolam (XANAX) 0.25 MG tablet TAKE ONE-HALF TABLET BY MOUTH FOUR TIMES DAILY AS NEEDED  . amLODipine (NORVASC) 2.5 MG tablet Take 1 Tablet by mouth once daily  . aspirin 81 MG tablet Take 81 mg by mouth every other day.  . blood glucose meter kit and supplies KIT Check BS once daily  . budesonide-formoterol (SYMBICORT) 160-4.5 MCG/ACT inhaler Inhale 2 puffs into the lungs 2 (two) times daily.  . calcium carbonate (OS-CAL) 600 MG TABS tablet Take 600 mg by mouth 2 (two) times daily with a meal.  . cholecalciferol (VITAMIN D) 1000 UNITS tablet  Take 2,000 Units by mouth daily.  Marland Kitchen escitalopram (LEXAPRO) 10 MG tablet Take 1 Tablet by mouth once daily as instructed  . fluticasone (FLONASE) 50 MCG/ACT nasal spray USE ONE SPRAY in each nostril ONCE daily  . gabapentin (NEURONTIN) 100 MG capsule take 1  Capsule by mouth 3 times daily (Patient taking differently: 1 capsule qhs)  . glucose blood (ONETOUCH VERIO) test strip Test Blood sugar four times daily  . hydrocortisone 1 % ointment Apply 1 application topically 2 (two) times daily.  Marland Kitchen ketoconazole  (NIZORAL) 2 % shampoo Apply 1 application topically 2 (two) times a week.  Elmore Guise Devices (ONE TOUCH DELICA LANCING DEV) MISC Use to check BG up to twice a day. DX: E11.29 - type 2 DM  . lovastatin (MEVACOR) 40 MG tablet Take 1 Tablet by mouth once daily (Patient taking differently: Take 1 Tablet by mouth every other day)  . metFORMIN (GLUCOPHAGE-XR) 500 MG 24 hr tablet Take 1 Tablet by mouth once daily with breakfast  . ONETOUCH DELICA LANCETS 32D MISC Use to check BG once a day to every other day Dx: E 11.9  . ONETOUCH VERIO test strip USE ONE STRIP TO CHECK GLUCOSE TWICE DAILY AND AS NEEDED   No facility-administered encounter medications on file as of 12/02/2016.      Review of Systems  Constitutional: Negative.   HENT: Negative.   Eyes: Negative.   Respiratory: Positive for cough.   Cardiovascular: Negative.   Gastrointestinal: Negative.   Endocrine: Negative.   Genitourinary: Negative.   Musculoskeletal: Positive for arthralgias (left shoulder pain).  Skin: Positive for rash (hives on back).  Allergic/Immunologic: Negative.   Neurological: Negative.   Hematological: Negative.   Psychiatric/Behavioral: Negative.        Objective:   Physical Exam  Constitutional: She is oriented to person, place, and time. No distress.  Elderly alert and tiny framed patient  HENT:  Head: Normocephalic and atraumatic.  Right Ear: External ear normal.  Left Ear: External ear normal.  Nose: Nose normal.  Mouth/Throat: Oropharynx is clear and moist.  No bruits thyromegaly or anterior cervical adenopathy  Eyes: Pupils are equal, round, and reactive to light. Conjunctivae and EOM are normal. Right eye exhibits no discharge. Left eye exhibits no discharge. No scleral icterus.  Pupils were equal round reactive to light and accommodation and no sign of any infection.  Neck: Normal range of motion. Neck supple. No thyromegaly present.  No bruits thyromegaly or anterior cervical adenopathy    Cardiovascular: Normal rate, regular rhythm, normal heart sounds and intact distal pulses.   No murmur heard. The heart is regular at 72/m  Pulmonary/Chest: Effort normal and breath sounds normal. No respiratory distress. She has no wheezes. She has no rales.  Clear anteriorly and posteriorly  Abdominal: Soft. Bowel sounds are normal. She exhibits no mass. There is no tenderness. There is no rebound and no guarding.  Protuberant abdomen without organ enlargement masses or bruits  Musculoskeletal: Normal range of motion. She exhibits no edema.  Lymphadenopathy:    She has no cervical adenopathy.  Neurological: She is alert and oriented to person, place, and time. She has normal reflexes. No cranial nerve deficit.  Skin: Skin is warm and dry. Rash noted. There is erythema.  The patient has this excoriated rash sparsely over her body that she's had off and on in the past. This comes from her picking at these places and not stopping. The daughter was there and we reassured them that  she needs to stop picking at these places and keeping an irritated. She is Re: Taking some Xanax and Lexapro. No change in treatment  Psychiatric: She has a normal mood and affect. Her behavior is normal. Judgment and thought content normal.  Nursing note and vitals reviewed.  BP (!) 145/64 (BP Location: Left Arm)   Pulse 65   Temp (!) 97 F (36.1 C) (Oral)   Ht _0  (1.575 m)   Wt 141 lb (64 kg)   BMI 25.79 kg/m   Repeat blood pressure was 138/60 in the right arm sitting with a small cuff      Assessment & Plan:  1. Type 2 diabetes mellitus without complication, without long-term current use of insulin (HCC) -Tinny with current treatment and aggressive therapeutic lifestyle changes  2. Essential hypertension -Blood pressure is good today and she will continue with current treatment  3. Pure hypercholesterolemia -All cholesterol numbers are good and she will continue with current treatment  4.  Vitamin D deficiency -Continue with vitamin D replacement and calcium replacement and weightbearing exercise  5. Chronic obstructive pulmonary disease, unspecified COPD type ([place]) -Use inhalers regularly and continue with Flonase and take Mucinex and avoid irritating environments  6. Hives -Appointment with dermatology if problem with hives continue  Patient Instructions                       Medicare Annual Wellness Visit  [facility] and the medical providers at [facility] strive to bring you the best medical care.  In doing so we not only want to address your current medical conditions and concerns but also to detect new conditions early and prevent illness, disease and health-related problems.    Medicare offers a yearly Wellness Visit which allows our clinical staff to assess your need for preventative services including immunizations, lifestyle education, counseling to decrease risk of preventable diseases and screening for fall risk and other medical concerns.    This visit is provided free of charge (no copay) for all Medicare recipients. The clinical pharmacists at [facility] have begun to conduct these Wellness Visits which will also include a thorough review of all your medications.    As you primary medical provider recommend that you make an appointment for your Annual Wellness Visit if you have not done so already this year.  You may set up this appointment before you leave today or you may call back ([phone]) and schedule an appointment.  Please make sure when you call that you mention that you are scheduling your Annual Wellness Visit with the clinical pharmacist so that the appointment may be made for the proper length of time.     Continue current medications. Continue good therapeutic lifestyle changes which include good diet and exercise. Fall precautions discussed with patient. If an FOBT was given today- please return it  to our front desk. If you are over 24 years old - you may need Prevnar 69 or the adult Pneumonia vaccine.  **Flu shots are available--- please call and schedule a FLU-CLINIC appointment**  After your visit with [place] today you will receive a survey in the mail or online from [employer] regarding your care with [place]. Please take a moment to fill this out. Your feedback is very important to [place] as you can help [place] better understand your patient needs as well as improve your experience and satisfaction. WE CARE ABOUT YOU!!!   The patient should  use her inhalers regularly and continue with Mucinex twice daily with a large glass of water and continue with her Flonase. She needs to continue to be careful not put herself at risk for falling She needs to drink plenty of fluids and stay well hydrated Keep feet checked regularly and if the problem with skin lesions continue please call us back and we'll make arrangements for her to see the dermatologist.    Arrie Senate MD

## 2016-12-04 ENCOUNTER — Other Ambulatory Visit: Payer: Self-pay | Admitting: Family Medicine

## 2016-12-07 ENCOUNTER — Other Ambulatory Visit: Payer: Self-pay | Admitting: Family Medicine

## 2016-12-09 DIAGNOSIS — Z1231 Encounter for screening mammogram for malignant neoplasm of breast: Secondary | ICD-10-CM | POA: Diagnosis not present

## 2016-12-15 ENCOUNTER — Other Ambulatory Visit: Payer: Self-pay | Admitting: Family Medicine

## 2016-12-18 ENCOUNTER — Telehealth: Payer: Self-pay | Admitting: Family Medicine

## 2016-12-18 DIAGNOSIS — L989 Disorder of the skin and subcutaneous tissue, unspecified: Secondary | ICD-10-CM

## 2016-12-18 NOTE — Telephone Encounter (Signed)
Has the same areas = no new rash Referral  To Dr Nevada Crane

## 2016-12-31 DIAGNOSIS — L281 Prurigo nodularis: Secondary | ICD-10-CM | POA: Diagnosis not present

## 2016-12-31 DIAGNOSIS — D485 Neoplasm of uncertain behavior of skin: Secondary | ICD-10-CM | POA: Diagnosis not present

## 2016-12-31 DIAGNOSIS — L280 Lichen simplex chronicus: Secondary | ICD-10-CM | POA: Diagnosis not present

## 2017-01-04 ENCOUNTER — Ambulatory Visit (INDEPENDENT_AMBULATORY_CARE_PROVIDER_SITE_OTHER): Payer: Medicare Other | Admitting: *Deleted

## 2017-01-04 DIAGNOSIS — Z23 Encounter for immunization: Secondary | ICD-10-CM

## 2017-01-04 NOTE — Progress Notes (Signed)
Pt given flu vaccine Tolerated well 

## 2017-01-20 ENCOUNTER — Other Ambulatory Visit: Payer: Self-pay | Admitting: Family Medicine

## 2017-01-20 NOTE — Telephone Encounter (Signed)
Last seen 12/02/16  [facility] If approved route to urse to call into [facility]

## 2017-01-21 NOTE — Telephone Encounter (Signed)
Please call in alprazolam with 1 refills 

## 2017-01-21 NOTE — Telephone Encounter (Signed)
Rx called to pharmacy

## 2017-02-08 ENCOUNTER — Other Ambulatory Visit: Payer: Self-pay | Admitting: Family Medicine

## 2017-02-09 ENCOUNTER — Ambulatory Visit (INDEPENDENT_AMBULATORY_CARE_PROVIDER_SITE_OTHER): Payer: Medicare Other | Admitting: Family Medicine

## 2017-02-09 ENCOUNTER — Encounter: Payer: Self-pay | Admitting: Family Medicine

## 2017-02-09 VITALS — BP 122/59 | HR 66 | Temp 97.1°F | Ht 62.0 in | Wt 146.8 lb

## 2017-02-09 DIAGNOSIS — R059 Cough, unspecified: Secondary | ICD-10-CM

## 2017-02-09 DIAGNOSIS — R05 Cough: Secondary | ICD-10-CM | POA: Diagnosis not present

## 2017-02-09 MED ORDER — PREDNISONE 10 MG (21) PO TBPK: ORAL_TABLET | Freq: Every day | ORAL | 0 refills | Status: DC

## 2017-02-09 MED ORDER — AZITHROMYCIN 250 MG PO TABS: ORAL_TABLET | ORAL | 0 refills | Status: DC

## 2017-02-09 NOTE — Patient Instructions (Addendum)
Great to see you!   Acute Bronchitis, Adult Acute bronchitis is when air tubes (bronchi) in the lungs suddenly get swollen. The condition can make it hard to breathe. It can also cause these symptoms:  A cough.  Coughing up clear, yellow, or green mucus.  Wheezing.  Chest congestion.  Shortness of breath.  A fever.  Body aches.  Chills.  A sore throat.  Follow these instructions at home: Medicines  Take over-the-counter and prescription medicines only as told by your doctor.  If you were prescribed an antibiotic medicine, take it as told by your doctor. Do not stop taking the antibiotic even if you start to feel better. General instructions  Rest.  Drink enough fluids to keep your pee (urine) clear or pale yellow.  Avoid smoking and secondhand smoke. If you smoke and you need help quitting, ask your doctor. Quitting will help your lungs heal faster.  Use an inhaler, cool mist vaporizer, or humidifier as told by your doctor.  Keep all follow-up visits as told by your doctor. This is important. How is this prevented? To lower your risk of getting this condition again:  Wash your hands often with soap and water. If you cannot use soap and water, use hand sanitizer.  Avoid contact with people who have cold symptoms.  Try not to touch your hands to your mouth, nose, or eyes.  Make sure to get the flu shot every year.  Contact a doctor if:  Your symptoms do not get better in 2 weeks. Get help right away if:  You cough up blood.  You have chest pain.  You have very bad shortness of breath.  You become dehydrated.  You faint (pass out) or keep feeling like you are going to pass out.  You keep throwing up (vomiting).  You have a very bad headache.  Your fever or chills gets worse. This information is not intended to replace advice given to you by your health care provider. Make sure you discuss any questions you have with your health care  provider. Document Released: 09/16/2007 Document Revised: 11/06/2015 Document Reviewed: 09/18/2015 Elsevier Interactive Patient Education  2017 [employer]  

## 2017-02-09 NOTE — Telephone Encounter (Signed)
OV 04/09/17

## 2017-02-09 NOTE — Progress Notes (Signed)
   HPI  Patient presents today here with cough.  Patient explains she has had cough and congestion for 3 days.  She also describes left chest soreness and pain which over the last 2 days.   She has mild shortness of breath as well. She has COPD at baseline and has been using Symbicort recently. Would like refill of Symbicort sample if available.  She is tolerating food and fluids by mouth. She does not have any exertional chest pain.  PMH: Smoking status noted ROS: Per HPI  Objective: BP (!) 122/59   Pulse 66   Temp (!) 97.1 F (36.2 C) (Oral)   Ht 5\' 2"  (1.575 m)   Wt 146 lb 12.8 oz (66.6 kg)   BMI 26.85 kg/m  Gen: NAD, alert, cooperative with exam HEENT: NCAT CV: RRR, good S1/S2, no murmur Chest wall: Tenderness to palpation of left sternal border, reproduces pain Resp: CTABL, no wheezes, non-labored Abd: SNTND, BS present, no guarding or organomegaly Ext: No edema, warm Neuro: Alert and oriented, No gross deficits  Assessment and plan:  #Cough, likely COPD exacerbation Likely mild developing COPD exacerbation, given azithromycin and prednisone Very short course of prednisone with quick taper, discussed glucose checks. Low threshold for return if symptoms worsen or do not improve     Meds ordered this encounter  Medications  . azithromycin (ZITHROMAX) 250 MG tablet    Sig: Take 2 tablets on day 1 and 1 tablet daily after that    Dispense:  6 tablet    Refill:  0  . predniSONE (STERAPRED UNI-PAK 21 TAB) 10 MG (21) TBPK tablet    Sig: Take by mouth daily. As directed x 6 days    Dispense:  21 tablet    Refill:  0    Laroy Apple, MD [facility] 02/09/2017, 3:08 PM

## 2017-02-23 ENCOUNTER — Other Ambulatory Visit: Payer: Self-pay | Admitting: Family Medicine

## 2017-02-25 ENCOUNTER — Ambulatory Visit: Payer: Medicare Other | Admitting: Family Medicine

## 2017-03-02 ENCOUNTER — Ambulatory Visit (INDEPENDENT_AMBULATORY_CARE_PROVIDER_SITE_OTHER): Payer: Medicare Other

## 2017-03-02 ENCOUNTER — Encounter: Payer: Self-pay | Admitting: Family Medicine

## 2017-03-02 ENCOUNTER — Ambulatory Visit: Payer: Medicare Other | Admitting: Family Medicine

## 2017-03-02 VITALS — BP 113/61 | HR 76 | Temp 97.1°F | Ht 62.0 in | Wt 147.0 lb

## 2017-03-02 DIAGNOSIS — M542 Cervicalgia: Secondary | ICD-10-CM | POA: Diagnosis not present

## 2017-03-02 DIAGNOSIS — M19012 Primary osteoarthritis, left shoulder: Secondary | ICD-10-CM | POA: Diagnosis not present

## 2017-03-02 DIAGNOSIS — M25512 Pain in left shoulder: Secondary | ICD-10-CM

## 2017-03-02 DIAGNOSIS — J410 Simple chronic bronchitis: Secondary | ICD-10-CM | POA: Diagnosis not present

## 2017-03-02 DIAGNOSIS — M47812 Spondylosis without myelopathy or radiculopathy, cervical region: Secondary | ICD-10-CM | POA: Diagnosis not present

## 2017-03-02 DIAGNOSIS — J301 Allergic rhinitis due to pollen: Secondary | ICD-10-CM

## 2017-03-02 MED ORDER — METHYLPREDNISOLONE ACETATE 80 MG/ML IJ SUSP
80.0000 mg | Freq: Once | INTRAMUSCULAR | Status: AC
  Administered 2017-03-02: 80 mg via INTRAMUSCULAR

## 2017-03-02 MED ORDER — PREDNISONE 10 MG PO TABS: ORAL_TABLET | ORAL | 0 refills | Status: DC

## 2017-03-02 NOTE — Progress Notes (Signed)
Subjective:    Patient ID: [name] [name], female    DOB: 01-Apr-1932, 83 y.o.   MRN: [id number]  HPI Patient here today for right side neck pain, nasal congestion and left shoulder pain.  The patient comes to the visit today because of some right sided neck pain head drainage that is blood-tinged and left shoulder pain.  Her vital signs are stable she is not running any fever.  The patient has a history of chronic bronchitis.  She also has diabetes mellitus with hypertension and hyperlipidemia.  She lives by herself but is closely monitored by her 2 daughters.  [name] [name] is with her today.  The patient says the crick in her neck has been there for about 5 days on the right side.  She is having some left shoulder pain.  There is been no history of any injury.  She has not been running a fever.  She denies any chest pain and no more shortness of breath than usual other than the drainage from her head.  She is using Mucinex and Flonase regularly.  She denies any trouble with her bowels or any trouble with passing her water.     Patient Active Problem List   Diagnosis Date Noted  . Type 2 diabetes mellitus with hyperlipidemia ([place]) 09/26/2015  . COPD (chronic obstructive pulmonary disease) ([place]) 05/29/2015  . Type 2 diabetes mellitus, controlled ([place]) 08/30/2014  . Rectal bleeding 03/07/2014  . History of total left hip arthroplasty 01/25/2014  . Elevated liver enzymes 01/13/2014  . Edema 01/02/2014  . Hypoxia 01/02/2014  . S/P total hip arthroplasty 12/27/2013  . Hyperlipidemia   . Anxiety   . Depressive disorder, not elsewhere classified   . Rhinitis   . Atrophic vaginitis   . Postmenopausal    Outpatient Encounter Medications as of 03/02/2017  Medication Sig  . albuterol (PROVENTIL HFA;VENTOLIN HFA) 108 (90 Base) MCG/ACT inhaler Inhale 2 puffs into the lungs every 6 (six) hours as needed for wheezing or shortness of breath.  . ALPRAZolam (XANAX) 0.25 MG tablet Take 1/2 tablet by mouth 4  times a day as needed  . amLODipine (NORVASC) 2.5 MG tablet Take 1 Tablet by mouth once daily  . aspirin 81 MG tablet Take 81 mg by mouth every other day.  . blood glucose meter kit and supplies KIT Check BS once daily  . budesonide-formoterol (SYMBICORT) 160-4.5 MCG/ACT inhaler Inhale 2 puffs into the lungs 2 (two) times daily.  . calcium carbonate (OS-CAL) 600 MG TABS tablet Take 600 mg by mouth 2 (two) times daily with a meal.  . cholecalciferol (VITAMIN D) 1000 UNITS tablet Take 2,000 Units by mouth daily.  Marland Kitchen escitalopram (LEXAPRO) 10 MG tablet Take 1 Tablet by mouth once daily as instructed  . fluticasone (FLONASE) 50 MCG/ACT nasal spray USE ONE SPRAY in each nostril ONCE daily  . fluticasone (FLONASE) 50 MCG/ACT nasal spray SPRAY 1 SPRAY IN EACH NOSTRIL ONCE DAILY.  Marland Kitchen gabapentin (NEURONTIN) 100 MG capsule take 1  Capsule by mouth 3 times daily (Patient taking differently: 1 capsule qhs)  . gabapentin (NEURONTIN) 100 MG capsule TAKE 1 CAPSULE AT BEDTIME FOR 4 NIGHTS, THEN 1 TWICE A DAY FOR 4 DAYSTHEN 1 THREE TIMES A DAY  . glucose blood (ONETOUCH VERIO) test strip Test Blood sugar four times daily  . hydrocortisone 1 % ointment Apply 1 application topically 2 (two) times daily.  Marland Kitchen ketoconazole (NIZORAL) 2 % shampoo Apply 1 application topically 2 (two) times a week.  Marland Kitchen  Lancet Devices (ONE TOUCH DELICA LANCING DEV) MISC Use to check BG up to twice a day. DX: E11.29 - type 2 DM  . lovastatin (MEVACOR) 40 MG tablet Take 1 Tablet by mouth once daily  . metFORMIN (GLUCOPHAGE-XR) 500 MG 24 hr tablet Take 1 Tablet by mouth once daily with breakfast  . metFORMIN (GLUCOPHAGE-XR) 500 MG 24 hr tablet TAKE 1 TABLET ONCE DAILY WITH BREAKFAST  . ONETOUCH DELICA LANCETS 90W MISC Use to check BG once a day to every other day Dx: E 11.9  . ONETOUCH VERIO test strip USE ONE STRIP TO CHECK GLUCOSE TWICE DAILY AND AS NEEDED  . [DISCONTINUED] azithromycin (ZITHROMAX) 250 MG tablet Take 2 tablets on day 1 and 1  tablet daily after that  . [DISCONTINUED] predniSONE (STERAPRED UNI-PAK 21 TAB) 10 MG (21) TBPK tablet Take by mouth daily. As directed x 6 days   No facility-administered encounter medications on file as of 03/02/2017.       Review of Systems  Constitutional: Negative.   HENT: Positive for congestion and nosebleeds.   Eyes: Negative.   Respiratory: Negative.   Cardiovascular: Negative.   Gastrointestinal: Negative.   Endocrine: Negative.   Genitourinary: Negative.   Musculoskeletal: Positive for arthralgias (left shoulder pain  // right side neck pain ).  Skin: Negative.   Allergic/Immunologic: Negative.   Neurological: Negative.   Hematological: Negative.   Psychiatric/Behavioral: Negative.        Objective:   Physical Exam  Constitutional: She is oriented to person, place, and time. She appears well-developed and well-nourished. No distress.  Elderly but alert  HENT:  Head: Normocephalic and atraumatic.  Right Ear: External ear normal.  Left Ear: External ear normal.  Mouth/Throat: Oropharynx is clear and moist. No oropharyngeal exudate.  Slight nasal congestion bilaterally no purulent drainage observed  Eyes: Conjunctivae and EOM are normal. Pupils are equal, round, and reactive to light. Right eye exhibits no discharge. Left eye exhibits no discharge. No scleral icterus.  Neck: Normal range of motion. Neck supple. No thyromegaly present.  No thyromegaly or anterior cervical adenopathy  Cardiovascular: Normal rate, regular rhythm and normal heart sounds.  No murmur heard. Heart was regular  Pulmonary/Chest: Effort normal. No respiratory distress. She has wheezes. She has no rales.  Diminished breath sounds bilaterally with a few wheezes  Musculoskeletal: She exhibits no edema.  Patient uses a cane for ambulation  Lymphadenopathy:    She has no cervical adenopathy.  Neurological: She is alert and oriented to person, place, and time.  Skin: Skin is warm and dry. Rash  noted. There is erythema.  Patient has her ongoing pruritic rash on her neck and back that she scratches a lot and keeps irritated.  She has seen a dermatologist about this.  1 of the lesions was removed and it was benign.  Psychiatric: She has a normal mood and affect. Her behavior is normal. Judgment and thought content normal.  Nursing note and vitals reviewed.  BP 113/61 (BP Location: Left Arm)   Pulse 76   Temp (!) 97.1 F (36.2 C) (Oral)   Ht 5' 2"  (1.575 m)   Wt 147 lb (66.7 kg)   BMI 26.89 kg/m   C-spine and left shoulder films with results pending===      Assessment & Plan:  1. Simple chronic bronchitis (HCC) -Continue with Mucinex nasal saline and Flonase -Take prednisone as directed  2. Seasonal allergic rhinitis due to pollen -Continue with Flonase and Mucinex and take prednisone  as directed  3. Neck pain on right side -Warm wet compresses 20 minutes 3 or 4 times daily to the left shoulder and right side of the neck -Take Tylenol as needed for pain - methylPREDNISolone acetate (DEPO-MEDROL) injection 80 mg - DG Cervical Spine Complete; Future  4. Left shoulder pain, unspecified chronicity Warm wet compresses for 20 minutes 3 or 4 times daily to the the left shoulder and right side of the neck - DG Shoulder Left; Future  Meds ordered this encounter  Medications  . predniSONE (DELTASONE) 10 MG tablet    Sig: Take 1 tab QID x 2 days, then 1 tab TID x 2 days, then 1 tab BID x 2 days, then 1 tab QD x 2 days    Dispense:  20 tablet    Refill:  0  . methylPREDNISolone acetate (DEPO-MEDROL) injection 80 mg   Patient Instructions  We will be given you a shot of Depo-Medrol and a prednisone taper This could run up your blood sugars for several days so monitor your blood sugars closely and try to eat less. Use warm wet compresses to the neck 20 minutes 3 or 4 times daily, no heating pad Take Tylenol if needed for pain Continue with Flonase and use nasal saline  frequently during the day and the Flonase at night Drink plenty of fluids and stay well-hydrated Continue with Mucinex twice daily with a large glass of water  Arrie Senate MD

## 2017-03-02 NOTE — Patient Instructions (Addendum)
We will be given you a shot of Depo-Medrol and a prednisone taper This could run up your blood sugars for several days so monitor your blood sugars closely and try to eat less. Use warm wet compresses to the neck 20 minutes 3 or 4 times daily, no heating pad Take Tylenol if needed for pain Continue with Flonase and use nasal saline frequently during the day and the Flonase at night Drink plenty of fluids and stay well-hydrated Continue with Mucinex twice daily with a large glass of water

## 2017-03-18 ENCOUNTER — Other Ambulatory Visit: Payer: Self-pay | Admitting: Family Medicine

## 2017-03-29 ENCOUNTER — Other Ambulatory Visit: Payer: Medicare Other

## 2017-03-29 DIAGNOSIS — R300 Dysuria: Secondary | ICD-10-CM | POA: Diagnosis not present

## 2017-03-29 DIAGNOSIS — E7800 Pure hypercholesterolemia, unspecified: Secondary | ICD-10-CM

## 2017-03-29 DIAGNOSIS — E119 Type 2 diabetes mellitus without complications: Secondary | ICD-10-CM | POA: Diagnosis not present

## 2017-03-29 DIAGNOSIS — I10 Essential (primary) hypertension: Secondary | ICD-10-CM

## 2017-03-29 DIAGNOSIS — E559 Vitamin D deficiency, unspecified: Secondary | ICD-10-CM | POA: Diagnosis not present

## 2017-03-29 LAB — URINALYSIS, COMPLETE
BILIRUBIN UA: NEGATIVE
GLUCOSE, UA: NEGATIVE
KETONES UA: NEGATIVE
Nitrite, UA: NEGATIVE
PROTEIN UA: NEGATIVE
RBC UA: NEGATIVE
SPEC GRAV UA: 1.015 (ref 1.005–1.030)
Urobilinogen, Ur: 0.2 mg/dL (ref 0.2–1.0)
pH, UA: 7.5 (ref 5.0–7.5)

## 2017-03-29 LAB — MICROSCOPIC EXAMINATION

## 2017-03-29 LAB — BAYER DCA HB A1C WAIVED: HB A1C: 6 % (ref <7.0)

## 2017-03-29 NOTE — Addendum Note (Signed)
Addended by: Zannie Cove on: 03/29/2017 09:37 AM   Modules accepted: Orders

## 2017-03-30 LAB — CBC WITH DIFFERENTIAL/PLATELET
BASOS: 0 %
Basophils Absolute: 0 10*3/uL (ref 0.0–0.2)
EOS (ABSOLUTE): 0.1 10*3/uL (ref 0.0–0.4)
EOS: 2 %
HEMATOCRIT: 41.4 % (ref 34.0–46.6)
HEMOGLOBIN: 14.1 g/dL (ref 11.1–15.9)
IMMATURE GRANS (ABS): 0 10*3/uL (ref 0.0–0.1)
Immature Granulocytes: 0 %
LYMPHS ABS: 1.6 10*3/uL (ref 0.7–3.1)
LYMPHS: 25 %
MCH: 30.7 pg (ref 26.6–33.0)
MCHC: 34.1 g/dL (ref 31.5–35.7)
MCV: 90 fL (ref 79–97)
MONOCYTES: 7 %
Monocytes Absolute: 0.4 10*3/uL (ref 0.1–0.9)
NEUTROS ABS: 4.2 10*3/uL (ref 1.4–7.0)
Neutrophils: 66 %
Platelets: 272 10*3/uL (ref 150–379)
RBC: 4.6 x10E6/uL (ref 3.77–5.28)
RDW: 14.2 % (ref 12.3–15.4)
WBC: 6.3 10*3/uL (ref 3.4–10.8)

## 2017-03-30 LAB — URINE CULTURE

## 2017-03-30 LAB — VITAMIN D 25 HYDROXY (VIT D DEFICIENCY, FRACTURES): Vit D, 25-Hydroxy: 41.8 ng/mL (ref 30.0–100.0)

## 2017-03-30 LAB — HEPATIC FUNCTION PANEL
ALBUMIN: 4.1 g/dL (ref 3.5–4.7)
ALK PHOS: 68 IU/L (ref 39–117)
ALT: 16 IU/L (ref 0–32)
AST: 26 IU/L (ref 0–40)
Bilirubin Total: 0.7 mg/dL (ref 0.0–1.2)
Bilirubin, Direct: 0.2 mg/dL (ref 0.00–0.40)
TOTAL PROTEIN: 6.5 g/dL (ref 6.0–8.5)

## 2017-03-30 LAB — LIPID PANEL
CHOL/HDL RATIO: 1.9 ratio (ref 0.0–4.4)
CHOLESTEROL TOTAL: 126 mg/dL (ref 100–199)
HDL: 66 mg/dL (ref >39)
LDL CALC: 48 mg/dL (ref 0–99)
Triglycerides: 59 mg/dL (ref 0–149)
VLDL CHOLESTEROL CAL: 12 mg/dL (ref 5–40)

## 2017-03-30 LAB — BMP8+EGFR
BUN / CREAT RATIO: 15 (ref 12–28)
BUN: 11 mg/dL (ref 8–27)
CHLORIDE: 101 mmol/L (ref 96–106)
CO2: 23 mmol/L (ref 20–29)
Calcium: 9 mg/dL (ref 8.7–10.3)
Creatinine, Ser: 0.73 mg/dL (ref 0.57–1.00)
GFR calc Af Amer: 88 mL/min/{1.73_m2} (ref >59)
GFR calc non Af Amer: 76 mL/min/{1.73_m2} (ref >59)
GLUCOSE: 91 mg/dL (ref 65–99)
Potassium: 4.5 mmol/L (ref 3.5–5.2)
SODIUM: 135 mmol/L (ref 134–144)

## 2017-03-31 ENCOUNTER — Other Ambulatory Visit: Payer: Self-pay | Admitting: *Deleted

## 2017-03-31 DIAGNOSIS — E119 Type 2 diabetes mellitus without complications: Secondary | ICD-10-CM | POA: Diagnosis not present

## 2017-03-31 MED ORDER — GLUCOSE BLOOD VI STRP: ORAL_STRIP | 1 refills | Status: DC

## 2017-04-09 ENCOUNTER — Ambulatory Visit: Payer: Medicare Other | Admitting: Family Medicine

## 2017-04-09 ENCOUNTER — Encounter: Payer: Self-pay | Admitting: Family Medicine

## 2017-04-09 VITALS — BP 115/66 | HR 60 | Temp 97.0°F | Ht 62.0 in | Wt 145.0 lb

## 2017-04-09 DIAGNOSIS — E119 Type 2 diabetes mellitus without complications: Secondary | ICD-10-CM

## 2017-04-09 DIAGNOSIS — J410 Simple chronic bronchitis: Secondary | ICD-10-CM

## 2017-04-09 DIAGNOSIS — J301 Allergic rhinitis due to pollen: Secondary | ICD-10-CM

## 2017-04-09 DIAGNOSIS — R05 Cough: Secondary | ICD-10-CM

## 2017-04-09 DIAGNOSIS — R059 Cough, unspecified: Secondary | ICD-10-CM

## 2017-04-09 DIAGNOSIS — J449 Chronic obstructive pulmonary disease, unspecified: Secondary | ICD-10-CM

## 2017-04-09 DIAGNOSIS — E7800 Pure hypercholesterolemia, unspecified: Secondary | ICD-10-CM | POA: Diagnosis not present

## 2017-04-09 DIAGNOSIS — I10 Essential (primary) hypertension: Secondary | ICD-10-CM | POA: Diagnosis not present

## 2017-04-09 DIAGNOSIS — E559 Vitamin D deficiency, unspecified: Secondary | ICD-10-CM

## 2017-04-09 MED ORDER — METHYLPREDNISOLONE ACETATE 80 MG/ML IJ SUSP
60.0000 mg | Freq: Once | INTRAMUSCULAR | Status: AC
  Administered 2017-04-09: 60 mg via INTRAMUSCULAR

## 2017-04-09 MED ORDER — AZITHROMYCIN 250 MG PO TABS: ORAL_TABLET | ORAL | 0 refills | Status: DC

## 2017-04-09 NOTE — Patient Instructions (Addendum)
Medicare Annual Wellness Visit  [facility] and the medical providers at [facility] strive to bring you the best medical care.  In doing so we not only want to address your current medical conditions and concerns but also to detect new conditions early and prevent illness, disease and health-related problems.    Medicare offers a yearly Wellness Visit which allows our clinical staff to assess your need for preventative services including immunizations, lifestyle education, counseling to decrease risk of preventable diseases and screening for fall risk and other medical concerns.    This visit is provided free of charge (no copay) for all Medicare recipients. The clinical pharmacists at [facility] have begun to conduct these Wellness Visits which will also include a thorough review of all your medications.    As you primary medical provider recommend that you make an appointment for your Annual Wellness Visit if you have not done so already this year.  You may set up this appointment before you leave today or you may call back ([phone]) and schedule an appointment.  Please make sure when you call that you mention that you are scheduling your Annual Wellness Visit with the clinical pharmacist so that the appointment may be made for the proper length of time.    Continue current medications. Continue good therapeutic lifestyle changes which include good diet and exercise. Fall precautions discussed with patient. If an FOBT was given today- please return it to our front desk. If you are over 6 years old - you may need Prevnar 19 or the adult Pneumonia vaccine.  **Flu shots are available--- please call and schedule a FLU-CLINIC appointment**  After your visit with [place] today you will receive a survey in the mail or online from [employer] regarding your care with [place]. Please take a moment to fill this out. Your feedback is very  important to [place] as you can help [place] better understand your patient needs as well as improve your experience and satisfaction. WE CARE ABOUT YOU!!!   Continue with inhalers Mucinex and drinking plenty of water and fluids and staying well-hydrated Take the antibiotic as directed Monitor blood sugars closely Use good hand and respiratory hygiene

## 2017-04-09 NOTE — Addendum Note (Signed)
Addended by: Zannie Cove on: 04/09/2017 10:21 AM   Modules accepted: Orders

## 2017-04-09 NOTE — Progress Notes (Signed)
Subjective:    Patient ID: [name] [name], female    DOB: 1931-12-13, 83 y.o.   MRN: [id number]  HPI Pt here for follow up and management of chronic medical problems which includes diabetes and hyperlipidemia. She is taking medication regularly.  The patient is doing well overall but still has some persistent cough and congestion.  She brings in blood sugars for review and they will be scanned into the record.  These go through the current date and the majority of these are running in the 80-134 140 range but generally is tending to be lower than higher.  These records will be scanned into her computer information.  She was seen on November 20 and treated with prednisone and Mucinex at that time.  Patient is pleasant and alert.  She complains of arthralgias and a persistent cough and she understands this is probably part of long-term issues of chronic bronchitis.  She has not been running a fever nor is she coughing up any purulent sputum.  The prednisone and Depo-Medrol that we gave her previously did help but not completely.  She denies any chest pain or shortness of breath anymore than usual.  She denies any trouble with her stomach including nausea vomiting diarrhea blood in the stool or black tarry bowel movements or change in bowel habits.  She is passing her water without problems.  All of her recent lab work from December 18 was reviewed with her.  Everything was excellent including her lipids her BMP or CBC liver function tests and her hemoglobin A1c which was 6.0.  She was reassured by this information.  She promises to stay on her inhalers use her nasal saline drink plenty of fluids and continue to work with her diet and exercise regimen to keep her blood sugar under good control.     Patient Active Problem List   Diagnosis Date Noted  . Type 2 diabetes mellitus with hyperlipidemia ([place]) 09/26/2015  . COPD (chronic obstructive pulmonary disease) ([place]) 05/29/2015  . Type 2 diabetes  mellitus, controlled ([place]) 08/30/2014  . Rectal bleeding 03/07/2014  . History of total left hip arthroplasty 01/25/2014  . Elevated liver enzymes 01/13/2014  . Edema 01/02/2014  . Hypoxia 01/02/2014  . S/P total hip arthroplasty 12/27/2013  . Hyperlipidemia   . Anxiety   . Depressive disorder, not elsewhere classified   . Rhinitis   . Atrophic vaginitis   . Postmenopausal    Outpatient Encounter Medications as of 04/09/2017  Medication Sig  . albuterol (PROVENTIL HFA;VENTOLIN HFA) 108 (90 Base) MCG/ACT inhaler Inhale 2 puffs into the lungs every 6 (six) hours as needed for wheezing or shortness of breath.  . ALPRAZolam (XANAX) 0.25 MG tablet Take 1/2 tablet by mouth 4 times a day as needed  . amLODipine (NORVASC) 2.5 MG tablet Take 1 Tablet by mouth once daily  . aspirin 81 MG tablet Take 81 mg by mouth every other day.  . blood glucose meter kit and supplies KIT Check BS once daily  . budesonide-formoterol (SYMBICORT) 160-4.5 MCG/ACT inhaler Inhale 2 puffs into the lungs 2 (two) times daily.  . calcium carbonate (OS-CAL) 600 MG TABS tablet Take 600 mg by mouth 2 (two) times daily with a meal.  . cholecalciferol (VITAMIN D) 1000 UNITS tablet Take 2,000 Units by mouth daily.  Marland Kitchen escitalopram (LEXAPRO) 10 MG tablet Take 1 Tablet by mouth once daily as instructed  . fluticasone (FLONASE) 50 MCG/ACT nasal spray USE ONE SPRAY in each nostril ONCE  daily  . fluticasone (FLONASE) 50 MCG/ACT nasal spray SPRAY 1 SPRAY IN EACH NOSTRIL ONCE DAILY.  Marland Kitchen gabapentin (NEURONTIN) 100 MG capsule take 1  Capsule by mouth 3 times daily (Patient taking differently: 1 capsule qhs)  . glucose blood (ONETOUCH VERIO) test strip USE 1 STRIP TO CHECK GLUCOSE TWICE DAILY AND AS NEEDED  . hydrocortisone 1 % ointment Apply 1 application topically 2 (two) times daily.  Marland Kitchen ketoconazole (NIZORAL) 2 % shampoo Apply 1 application topically 2 (two) times a week.  Elmore Guise Devices (ONE TOUCH DELICA LANCING DEV) MISC Use to  check BG up to twice a day. DX: E11.29 - type 2 DM  . lovastatin (MEVACOR) 40 MG tablet Take 1 Tablet by mouth once daily  . metFORMIN (GLUCOPHAGE-XR) 500 MG 24 hr tablet Take 1 Tablet by mouth once daily with breakfast  . ONETOUCH DELICA LANCETS 16X MISC Use to check BG once a day to every other day Dx: E 11.9  . [DISCONTINUED] gabapentin (NEURONTIN) 100 MG capsule TAKE 1 CAPSULE AT BEDTIME FOR 4 NIGHTS, THEN 1 TWICE A DAY FOR 4 DAYSTHEN 1 THREE TIMES A DAY  . [DISCONTINUED] metFORMIN (GLUCOPHAGE-XR) 500 MG 24 hr tablet TAKE 1 TABLET ONCE DAILY WITH BREAKFAST  . [DISCONTINUED] predniSONE (DELTASONE) 10 MG tablet Take 1 tab QID x 2 days, then 1 tab TID x 2 days, then 1 tab BID x 2 days, then 1 tab QD x 2 days   No facility-administered encounter medications on file as of 04/09/2017.       Review of Systems  Constitutional: Negative.   HENT: Positive for congestion.   Eyes: Negative.   Respiratory: Positive for cough.   Gastrointestinal: Negative.   Endocrine: Negative.   Genitourinary: Negative.   Musculoskeletal: Negative.   Skin: Negative.   Allergic/Immunologic: Negative.   Neurological: Negative.   Hematological: Negative.   Psychiatric/Behavioral: Negative.        Objective:   Physical Exam  Constitutional: She is oriented to person, place, and time. She appears well-developed and well-nourished. No distress.  HENT:  Head: Normocephalic and atraumatic.  Right Ear: External ear normal.  Left Ear: External ear normal.  Nose: Nose normal.  Mouth/Throat: Oropharynx is clear and moist.  Eyes: Conjunctivae and EOM are normal. Pupils are equal, round, and reactive to light. Right eye exhibits no discharge. Left eye exhibits no discharge. No scleral icterus.  Neck: Normal range of motion. Neck supple. No thyromegaly present.  Cardiovascular: Normal rate, regular rhythm, normal heart sounds and intact distal pulses.  No murmur heard. The heart was regular at 72/min    Pulmonary/Chest: Effort normal and breath sounds normal. No respiratory distress. She has no wheezes. She has no rales. She exhibits no tenderness.  Minimal congestion with coughing  Abdominal: Soft. Bowel sounds are normal. She exhibits no mass. There is no tenderness. There is no rebound and no guarding.  No abdominal tenderness masses bruits or organ enlargement  Musculoskeletal: Normal range of motion. She exhibits no edema.  Lymphadenopathy:    She has no cervical adenopathy.  Neurological: She is alert and oriented to person, place, and time. She has normal reflexes. No cranial nerve deficit.  Skin: Skin is warm and dry. No rash noted.  Psychiatric: She has a normal mood and affect. Her behavior is normal. Judgment and thought content normal.  Nursing note and vitals reviewed.   BP 115/66 (BP Location: Left Arm)   Pulse 60   Temp (!) 97 F (36.1 C) (  Oral)   Ht _0  (1.575 m)   Wt 145 lb (65.8 kg)   BMI 26.52 kg/m       Assessment & Plan:  1. Essential hypertension -Blood pressure is under good control she will continue with current treatment  2. Pure hypercholesterolemia -All lipids were excellent she will continue with current treatment  3. Type 2 diabetes mellitus without complication, without long-term current use of insulin (HCC) -Hemoglobin A1c was excellent at present she will continue with current treatment  4. Cough -Continue with Mucinex and inhalers and drinking plenty of fluids and keep in the house as cool as possible  5. Vitamin D deficiency -The vitamin D level was good and she will continue with current treatment  6. Chronic obstructive pulmonary disease, unspecified COPD type ([place]) -Continue with inhalers Mucinex and drinking plenty of fluids  7. Seasonal allergic rhinitis due to pollen -Continue with nasal steroid  8. Simple chronic bronchitis (HCC) -Take Z-Pak as directed take Tylenol for aches pains and fever and drink plenty of fluids  No  orders of the defined types were placed in this encounter.  Patient Instructions                       Medicare Annual Wellness Visit  [facility] and the medical providers at [facility] strive to bring you the best medical care.  In doing so we not only want to address your current medical conditions and concerns but also to detect new conditions early and prevent illness, disease and health-related problems.    Medicare offers a yearly Wellness Visit which allows our clinical staff to assess your need for preventative services including immunizations, lifestyle education, counseling to decrease risk of preventable diseases and screening for fall risk and other medical concerns.    This visit is provided free of charge (no copay) for all Medicare recipients. The clinical pharmacists at [facility] have begun to conduct these Wellness Visits which will also include a thorough review of all your medications.    As you primary medical provider recommend that you make an appointment for your Annual Wellness Visit if you have not done so already this year.  You may set up this appointment before you leave today or you may call back [phone]) and schedule an appointment.  Please make sure when you call that you mention that you are scheduling your Annual Wellness Visit with the clinical pharmacist so that the appointment may be made for the proper length of time.    Continue current medications. Continue good therapeutic lifestyle changes which include good diet and exercise. Fall precautions discussed with patient. If an FOBT was given today- please return it to our front desk. If you are over 65 years old - you may need Prevnar 72 or the adult Pneumonia vaccine.  **Flu shots are available--- please call and schedule a FLU-CLINIC appointment**  After your visit with [place] today you will receive a survey in the mail or online from [employer] regarding  your care with [place]. Please take a moment to fill this out. Your feedback is very important to [place] as you can help [place] better understand your patient needs as well as improve your experience and satisfaction. WE CARE ABOUT YOU!!!   Continue with inhalers Mucinex and drinking plenty of water and fluids and staying well-hydrated Take the antibiotic as directed Monitor blood sugars closely Use good hand and respiratory hygiene   Timmothy Sours  Jennette Bill MD

## 2017-04-20 ENCOUNTER — Other Ambulatory Visit: Payer: Self-pay | Admitting: Family Medicine

## 2017-04-20 ENCOUNTER — Telehealth: Payer: Self-pay | Admitting: Family Medicine

## 2017-04-20 NOTE — Telephone Encounter (Signed)
ntbs

## 2017-04-20 NOTE — Telephone Encounter (Signed)
Cough x3 days, no fever, no mucus, pt uses [employer], offered pt appt, pt declined

## 2017-04-20 NOTE — Telephone Encounter (Signed)
Offered appt. Pt declined.

## 2017-05-03 NOTE — Progress Notes (Signed)
Subjective: CC: URI PCP: Chipper Herb, MD [name] [name] is a 84 y.o. female presenting to clinic today for:  1. Cold symptoms  Patient reports chronic cough.  Denies productive cough, hemoptysis, congestion, rhinorrhea, sinus pressure, headache, SOB, dizziness, rash, nausea, vomiting, diarrhea, fevers, chills, myalgia, sick contacts, recent travel.  Patient has used Flonase, Symbicort, Albuterol with little relief of symptoms.  She goes on to note that sometimes she forgets to take her Symbicort and albuterol.  She never uses the albuterol more than once a day.  She has a history of COPD and allergies.  Denies tobacco use/ exposure.  She notes reluctance to add or change her medications given financial restraints.  She cites "that she is a widow".   ROS: Per HPI  Allergies  Allergen Reactions  . Lisinopril Cough  . Chlordiazepoxide-Clidinium     Can't remember  . Clarithromycin     Can't remember  . Erymax [Erythromycin]     Can't remember  . Penicillins     Can't remember  . Betadine [Povidone Iodine] Rash  . Evista [Raloxifene Hydrochloride]     Numb feeling  . Hydrogen Peroxide Rash  . Sertraline Hcl Other (See Comments)    Sweating and mouth irritation   . Sulfa Antibiotics Nausea And Vomiting   Past Medical History:  Diagnosis Date  . Anxiety   . Arthritis   . Atrophic vaginitis   . Cataract   . COPD (chronic obstructive pulmonary disease) ([place])   . Depressive disorder, not elsewhere classified   . Diabetes mellitus without complication ([place])   . Disorder of bone and cartilage, unspecified   . Elevated blood sugar   . Emphysema lung ([place])   . Hypertension   . Other and unspecified hyperlipidemia   . Postmenopausal   . Rhinitis     Current Outpatient Medications:  .  albuterol (PROVENTIL HFA;VENTOLIN HFA) 108 (90 Base) MCG/ACT inhaler, Inhale 2 puffs into the lungs every 6 (six) hours as needed for wheezing or shortness of breath., Disp: 1 Inhaler,  Rfl: 6 .  ALPRAZolam (XANAX) 0.25 MG tablet, Take 1/2 tablet by mouth 4 times a day as needed, Disp: 60 tablet, Rfl: 1 .  amLODipine (NORVASC) 2.5 MG tablet, Take 1 Tablet by mouth once daily, Disp: 90 tablet, Rfl: 1 .  aspirin 81 MG tablet, Take 81 mg by mouth every other day., Disp: , Rfl:  .  azithromycin (ZITHROMAX) 250 MG tablet, As directed, Disp: 6 tablet, Rfl: 0 .  blood glucose meter kit and supplies KIT, Check BS once daily, Disp: 1 each, Rfl: 0 .  budesonide-formoterol (SYMBICORT) 160-4.5 MCG/ACT inhaler, Inhale 2 puffs into the lungs 2 (two) times daily., Disp: 1 Inhaler, Rfl: 0 .  calcium carbonate (OS-CAL) 600 MG TABS tablet, Take 600 mg by mouth 2 (two) times daily with a meal., Disp: , Rfl:  .  cholecalciferol (VITAMIN D) 1000 UNITS tablet, Take 2,000 Units by mouth daily., Disp: , Rfl:  .  escitalopram (LEXAPRO) 10 MG tablet, TAKE 1 TABLET DAILY AS DIRECTED, Disp: 30 tablet, Rfl: 2 .  fluticasone (FLONASE) 50 MCG/ACT nasal spray, USE ONE SPRAY in each nostril ONCE daily, Disp: 16 g, Rfl: 4 .  fluticasone (FLONASE) 50 MCG/ACT nasal spray, SPRAY 1 SPRAY IN EACH NOSTRIL ONCE DAILY., Disp: 16 g, Rfl: 0 .  gabapentin (NEURONTIN) 100 MG capsule, take 1  Capsule by mouth 3 times daily (Patient taking differently: 1 capsule qhs), Disp: 90 capsule, Rfl: 0 .  glucose blood (ONETOUCH VERIO) test strip, USE 1 STRIP TO CHECK GLUCOSE TWICE DAILY AND AS NEEDED, Disp: 200 each, Rfl: 1 .  hydrocortisone 1 % ointment, Apply 1 application topically 2 (two) times daily., Disp: 30 g, Rfl: 0 .  ketoconazole (NIZORAL) 2 % shampoo, Apply 1 application topically 2 (two) times a week., Disp: 120 mL, Rfl: 0 .  Lancet Devices (ONE TOUCH DELICA LANCING DEV) MISC, Use to check BG up to twice a day. DX: E11.29 - type 2 DM, Disp: 1 each, Rfl: 0 .  lovastatin (MEVACOR) 40 MG tablet, Take 1 Tablet by mouth once daily, Disp: 90 tablet, Rfl: 1 .  metFORMIN (GLUCOPHAGE-XR) 500 MG 24 hr tablet, Take 1 Tablet by mouth  once daily with breakfast, Disp: 90 tablet, Rfl: 0 .  ONETOUCH DELICA LANCETS 82L MISC, Use to check BG once a day to every other day Dx: E 11.9, Disp: 100 each, Rfl: 3 Social History   Socioeconomic History  . Marital status: Married    Spouse name: Not on file  . Number of children: Not on file  . Years of education: Not on file  . Highest education level: Not on file  Social Needs  . Financial resource strain: Not on file  . Food insecurity - worry: Not on file  . Food insecurity - inability: Not on file  . Transportation needs - medical: Not on file  . Transportation needs - non-medical: Not on file  Occupational History  . Not on file  Tobacco Use  . Smoking status: Never Smoker  . Smokeless tobacco: Never Used  Substance and Sexual Activity  . Alcohol use: No    Alcohol/week: 0.0 oz  . Drug use: No  . Sexual activity: No  Other Topics Concern  . Not on file  Social History Narrative  . Not on file   Family History  Problem Relation Age of Onset  . Arthritis Mother   . COPD Father   . Diabetes Father   . Aneurysm Sister   . Diabetes Sister   . Diabetes Sister   . Hypertension Sister     Objective: Office vital signs reviewed. BP 112/64   Pulse 63   Temp 97.6 F (36.4 C) (Oral)   Ht 5' 2"  (1.575 m)   Wt 148 lb (67.1 kg)   SpO2 95%   BMI 27.07 kg/m   Physical Examination:  General: Awake, alert, nontoxic appearing, No acute distress HEENT: Normal    Neck: No masses palpated. No lymphadenopathy    Ears: Tympanic membranes intact, normal light reflex, no erythema, no bulging    Eyes: PERRLA, esotropia appreciated (L), sclera white    Nose: nasal turbinates moist, no nasal discharge    Throat: moist mucus membranes, no erythema, no tonsillar exudate.  Airway is patent Cardio: regular rate and rhythm, S1S2 heard, no murmurs appreciated Pulm: Globally decreased breath sounds.  No apparent wheezes, rhonchi or rales.  Normal work of breathing on room  air.  Assessment/ Plan: 84 y.o. female   1. Chronic cough She was given a DuoNeb here in office.  Her pulmonary exam did improve with improved air movement throughout.  Again, no wheezes, rhonchi or rales.  I suspect that coughing is related to underlying COPD which may not be well-controlled.  I did discuss considering adding Spiriva or 1 of the combination inhalers intended for COPD but patient was reluctant to do this.  Difficult to tell whether or not she is failing current outpatient  inhalers versus not entirely compliant with them.  I reiterated the importance of Symbicort 2 puffs twice daily.  I recommended that she use her albuterol 1 nebulized inhaler every 6 hours for the next 2 days.  She may then space it as needed as directed.  No significant wheezing appreciated on today's exam.  Therefore she was not treated with steroids.  Additionally, symptomology does not fit with a COPD exacerbation.  No antibiotics prescribed.  Home care instructions were reviewed with the patient.  She was good understanding.  AVS was reviewed with the patient.  She will follow-up with PCP as needed.  2. Simple chronic bronchitis (HCC) - ipratropium-albuterol (DUONEB) 0.5-2.5 (3) MG/3ML nebulizer solution 3 mL  Meds ordered this encounter  Medications  . ipratropium-albuterol (DUONEB) 0.5-2.5 (3) MG/3ML nebulizer solution 3 mL     Janora Norlander, DO [place] [phone]

## 2017-05-04 ENCOUNTER — Ambulatory Visit: Payer: Medicare Other | Admitting: Family Medicine

## 2017-05-04 ENCOUNTER — Encounter: Payer: Self-pay | Admitting: Family Medicine

## 2017-05-04 VITALS — BP 112/64 | HR 63 | Temp 97.6°F | Ht 62.0 in | Wt 148.0 lb

## 2017-05-04 DIAGNOSIS — R053 Chronic cough: Secondary | ICD-10-CM

## 2017-05-04 DIAGNOSIS — R05 Cough: Secondary | ICD-10-CM

## 2017-05-04 DIAGNOSIS — J410 Simple chronic bronchitis: Secondary | ICD-10-CM

## 2017-05-04 MED ORDER — IPRATROPIUM-ALBUTEROL 0.5-2.5 (3) MG/3ML IN SOLN
3.0000 mL | Freq: Once | RESPIRATORY_TRACT | Status: AC
  Administered 2017-05-04: 3 mL via RESPIRATORY_TRACT

## 2017-05-04 NOTE — Patient Instructions (Signed)
You were given a DuoNeb inhaler treatment today.  As we discussed:  Symbicort: 2 puffs 2 times daily  Albuterol: Use 1 nebulizer every 6 hours for the next 2 days.  Then resume using it every 6 hours as needed as directed.  If your symptoms are not improving, they worsen, you develop high fevers, cough up blood or significant shortness of breath, please seek immediate medical attention

## 2017-05-18 ENCOUNTER — Other Ambulatory Visit: Payer: Self-pay | Admitting: Family Medicine

## 2017-05-20 ENCOUNTER — Other Ambulatory Visit: Payer: Self-pay | Admitting: Nurse Practitioner

## 2017-06-21 ENCOUNTER — Other Ambulatory Visit: Payer: Self-pay | Admitting: Family Medicine

## 2017-06-23 ENCOUNTER — Encounter: Payer: Self-pay | Admitting: Family Medicine

## 2017-06-23 ENCOUNTER — Ambulatory Visit: Payer: Medicare Other | Admitting: Family Medicine

## 2017-06-23 VITALS — BP 124/72 | HR 77 | Temp 97.9°F | Ht 62.0 in | Wt 148.0 lb

## 2017-06-23 DIAGNOSIS — R05 Cough: Secondary | ICD-10-CM | POA: Diagnosis not present

## 2017-06-23 DIAGNOSIS — R300 Dysuria: Secondary | ICD-10-CM | POA: Diagnosis not present

## 2017-06-23 DIAGNOSIS — J310 Chronic rhinitis: Secondary | ICD-10-CM

## 2017-06-23 DIAGNOSIS — R053 Chronic cough: Secondary | ICD-10-CM

## 2017-06-23 DIAGNOSIS — J329 Chronic sinusitis, unspecified: Secondary | ICD-10-CM | POA: Diagnosis not present

## 2017-06-23 DIAGNOSIS — L03221 Cellulitis of neck: Secondary | ICD-10-CM | POA: Diagnosis not present

## 2017-06-23 DIAGNOSIS — F439 Reaction to severe stress, unspecified: Secondary | ICD-10-CM

## 2017-06-23 DIAGNOSIS — J449 Chronic obstructive pulmonary disease, unspecified: Secondary | ICD-10-CM

## 2017-06-23 DIAGNOSIS — L0211 Cutaneous abscess of neck: Secondary | ICD-10-CM | POA: Diagnosis not present

## 2017-06-23 LAB — URINALYSIS, COMPLETE
BILIRUBIN UA: NEGATIVE
GLUCOSE, UA: NEGATIVE
KETONES UA: NEGATIVE
Nitrite, UA: NEGATIVE
PROTEIN UA: NEGATIVE
RBC, UA: NEGATIVE
SPEC GRAV UA: 1.015 (ref 1.005–1.030)
UUROB: 0.2 mg/dL (ref 0.2–1.0)
pH, UA: 7 (ref 5.0–7.5)

## 2017-06-23 LAB — MICROSCOPIC EXAMINATION: Renal Epithel, UA: NONE SEEN /hpf

## 2017-06-23 MED ORDER — MUPIROCIN 2 % EX OINT: 1.0000 "application " | TOPICAL_OINTMENT | Freq: Two times a day (BID) | CUTANEOUS | 0 refills | Status: DC

## 2017-06-23 MED ORDER — CEFDINIR 300 MG PO CAPS: 300.0000 mg | ORAL_CAPSULE | Freq: Two times a day (BID) | ORAL | 0 refills | Status: DC

## 2017-06-23 NOTE — Patient Instructions (Addendum)
Use Bactroban ointment to irritated areas of skin on the neck twice daily Take antibiotic twice daily as directed for rhinosinusitis Use nasal saline frequently and each nostril Continue preventatively to use inhalers regularly including Symbicort and albuterol Drink plenty of fluids and stay well-hydrated Take Mucinex maximum strength plain, blue and white in color 1 twice daily for cough and congestion We will call with results of urinalysis and urine culture and sensitivity when those results are returned

## 2017-06-23 NOTE — Progress Notes (Signed)
Subjective:    Patient ID: [name] [name], female    DOB: 06/13/1931, 84 y.o.   MRN: [id number]  HPI Patient here today for chronic cough.  The patient has had drainage for 3 weeks.  She denies any fever associated with this.  It is been blood-tinged at times and she is coughing some but not to the extent that she has been in the past.  She is using her inhalers regularly.  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  She denies any GI symptoms but has had some problems with passing her water with slight burning and dysuria.    Patient Active Problem List   Diagnosis Date Noted  . Type 2 diabetes mellitus with hyperlipidemia ([place]) 09/26/2015  . COPD (chronic obstructive pulmonary disease) ([place]) 05/29/2015  . Type 2 diabetes mellitus, controlled ([place]) 08/30/2014  . Rectal bleeding 03/07/2014  . History of total left hip arthroplasty 01/25/2014  . Elevated liver enzymes 01/13/2014  . Edema 01/02/2014  . Hypoxia 01/02/2014  . S/P total hip arthroplasty 12/27/2013  . Hyperlipidemia   . Anxiety   . Depressive disorder, not elsewhere classified   . Rhinitis   . Atrophic vaginitis   . Postmenopausal    Outpatient Encounter Medications as of 06/23/2017  Medication Sig  . albuterol (PROVENTIL HFA;VENTOLIN HFA) 108 (90 Base) MCG/ACT inhaler Inhale 2 puffs into the lungs every 6 (six) hours as needed for wheezing or shortness of breath.  Marland Kitchen albuterol (PROVENTIL) (2.5 MG/3ML) 0.083% nebulizer solution NEBULIZE 1 VIAL EVERY 6 HOURS AS NEEDED FOR SHORTNESS OF BREATH OR WHEEZING  . ALPRAZolam (XANAX) 0.25 MG tablet TAKE 1/2 TABLET FOUR TIMES DAILY AS NEEDED  . amLODipine (NORVASC) 2.5 MG tablet Take 1 Tablet by mouth once daily  . aspirin 81 MG tablet Take 81 mg by mouth every  other day.  . blood glucose meter kit and supplies KIT Check BS once daily  . budesonide-formoterol (SYMBICORT) 160-4.5 MCG/ACT inhaler Inhale 2 puffs into the lungs 2 (two) times daily.  . calcium carbonate (OS-CAL) 600 MG TABS tablet Take 600 mg by mouth 2 (two) times daily with a meal.  . cholecalciferol (VITAMIN D) 1000 UNITS tablet Take 2,000 Units by mouth daily.  Marland Kitchen escitalopram (LEXAPRO) 10 MG tablet TAKE 1 TABLET DAILY AS DIRECTED  . fluticasone (FLONASE) 50 MCG/ACT nasal spray USE ONE SPRAY in each nostril ONCE daily  . fluticasone (FLONASE) 50 MCG/ACT nasal spray SPRAY 1 SPRAY IN EACH NOSTRIL ONCE DAILY.  Marland Kitchen gabapentin (NEURONTIN) 100 MG capsule take 1  Capsule by mouth 3 times daily (Patient taking differently: 1 capsule qhs)  . glucose blood (ONETOUCH VERIO) test strip USE 1 STRIP TO CHECK GLUCOSE TWICE DAILY AND AS NEEDED  . hydrocortisone 1 % ointment Apply 1 application topically 2 (two) times daily.  Marland Kitchen ketoconazole (NIZORAL) 2 % shampoo Apply 1 application topically 2 (two) times a week.  Elmore Guise Devices (ONE TOUCH DELICA LANCING DEV) MISC Use to check BG up to twice a day. DX: E11.29 - type 2 DM  . lovastatin (MEVACOR) 40 MG tablet Take 1 Tablet by mouth once daily  . metFORMIN (GLUCOPHAGE-XR) 500 MG 24 hr tablet Take 1 Tablet by mouth once daily with  breakfast  . ONETOUCH DELICA LANCETS 14G MISC Use to check BG once a day to every other day Dx: E 11.9   No facility-administered encounter medications on file as of 06/23/2017.       Review of Systems  Constitutional: Negative.   HENT: Positive for congestion (head ).   Eyes: Positive for discharge (when wakes up in the morning).  Respiratory: Positive for cough (x 3 weeks ).   Cardiovascular: Negative.   Gastrointestinal: Negative.   Endocrine: Negative.   Genitourinary: Positive for dysuria.       Incontinence of urine   Musculoskeletal: Negative.   Skin: Negative.   Allergic/Immunologic: Negative.   Neurological:  Negative.   Hematological: Negative.   Psychiatric/Behavioral: Negative.        Stress  - Sunday school is causing problems at church x 3 weeks        Objective:   Physical Exam  Constitutional: She is oriented to person, place, and time. She appears well-developed and well-nourished. She appears distressed.  Tearful but eventually improved.  Both daughters were present and expressed concerned about their mother being so upset with her Sunday school class.  HENT:  Head: Normocephalic and atraumatic.  Right Ear: External ear normal.  Left Ear: External ear normal.  Mouth/Throat: Oropharynx is clear and moist. No oropharyngeal exudate.  Thick yellow slights green drainage in head and throat.  Eyes: Conjunctivae and EOM are normal. Pupils are equal, round, and reactive to light. Right eye exhibits no discharge. Left eye exhibits no discharge. No scleral icterus.  Eyes with conjunctival redness.  Neck: Normal range of motion. Neck supple. No thyromegaly present.  Cardiovascular: Normal rate, regular rhythm and normal heart sounds.  No murmur heard. Pulmonary/Chest: Effort normal. No respiratory distress. She has wheezes. She has no rales.  Diminished breath sounds with scattered wheezes and no rales.  Abdominal: Soft. Bowel sounds are normal. She exhibits no mass. There is no tenderness. There is no rebound and no guarding.  Musculoskeletal: Normal range of motion. She exhibits no edema.  Lymphadenopathy:    She has no cervical adenopathy.  Neurological: She is alert and oriented to person, place, and time.  Skin: Skin is warm and dry. Rash noted. There is erythema.  Erythematous areas of redness on posterior neck  Psychiatric: She has a normal mood and affect. Her behavior is normal. Judgment and thought content normal.  Nursing note and vitals reviewed.  BP 124/72 (BP Location: Left Arm)   Pulse 77   Temp 97.9 F (36.6 C) (Oral)   Ht 5' 2" (1.575 m)   Wt 148 lb (67.1 kg)   BMI  27.07 kg/m         Assessment & Plan:  1. Chronic cough -Continue with Mucinex Symbicort and albuterol as a rescue inhaler or in the nebulizer if coughing more - CBC with Differential/Platelet  2. Dysuria -Drink plenty of fluids and stay well-hydrated - CBC with Differential/Platelet - Urine Culture - Urinalysis, Complete  3. Situational stress -Continue with current treatment as needed and patient was advised to put herself above the issue and go back to church and Sunday school and be there for what she is therefore and not let the pettiness of a woman get to her  4. Rhinosinusitis -Take antibiotic as directed, Omnicef 300 twice daily for 10 days -Use nasal saline frequently during the day -Continue to use Flonase 1 spray each nostril at bedtime -Use cool mist humidification  5. Cellulitis and abscess  of neck -Apply mupirocin ointment and continue to take Omnicef as directed  6. Chronic obstructive pulmonary disease, unspecified COPD type ([place]) -Continue with inhalers Mucinex plenty of fluids and cool mist humidifier   Meds ordered this encounter  Medications  . cefdinir (OMNICEF) 300 MG capsule    Sig: Take 1 capsule (300 mg total) by mouth 2 (two) times daily. 1 po BID    Dispense:  20 capsule    Refill:  0  . mupirocin ointment (BACTROBAN) 2 %    Sig: Apply 1 application topically 2 (two) times daily.    Dispense:  22 g    Refill:  0   Patient Instructions  Use Bactroban ointment to irritated areas of skin on the neck twice daily Take antibiotic twice daily as directed for rhinosinusitis Use nasal saline frequently and each nostril Continue preventatively to use inhalers regularly including Symbicort and albuterol Drink plenty of fluids and stay well-hydrated Take Mucinex maximum strength plain, blue and white in color 1 twice daily for cough and congestion We will call with results of urinalysis and urine culture and sensitivity when those results are  returned  Arrie Senate MD

## 2017-06-24 LAB — CBC WITH DIFFERENTIAL/PLATELET
BASOS ABS: 0 10*3/uL (ref 0.0–0.2)
Basos: 0 %
EOS (ABSOLUTE): 0.1 10*3/uL (ref 0.0–0.4)
Eos: 1 %
Hematocrit: 41.3 % (ref 34.0–46.6)
Hemoglobin: 14.1 g/dL (ref 11.1–15.9)
IMMATURE GRANULOCYTES: 0 %
Immature Grans (Abs): 0 10*3/uL (ref 0.0–0.1)
LYMPHS ABS: 1.8 10*3/uL (ref 0.7–3.1)
Lymphs: 25 %
MCH: 31.7 pg (ref 26.6–33.0)
MCHC: 34.1 g/dL (ref 31.5–35.7)
MCV: 93 fL (ref 79–97)
MONOS ABS: 0.6 10*3/uL (ref 0.1–0.9)
Monocytes: 9 %
NEUTROS PCT: 65 %
Neutrophils Absolute: 4.8 10*3/uL (ref 1.4–7.0)
PLATELETS: 316 10*3/uL (ref 150–379)
RBC: 4.45 x10E6/uL (ref 3.77–5.28)
RDW: 13.9 % (ref 12.3–15.4)
WBC: 7.3 10*3/uL (ref 3.4–10.8)

## 2017-06-27 LAB — URINE CULTURE

## 2017-07-01 DIAGNOSIS — H50011 Monocular esotropia, right eye: Secondary | ICD-10-CM | POA: Diagnosis not present

## 2017-07-01 DIAGNOSIS — Z961 Presence of intraocular lens: Secondary | ICD-10-CM | POA: Diagnosis not present

## 2017-07-01 DIAGNOSIS — H40013 Open angle with borderline findings, low risk, bilateral: Secondary | ICD-10-CM | POA: Diagnosis not present

## 2017-07-01 DIAGNOSIS — H53001 Unspecified amblyopia, right eye: Secondary | ICD-10-CM | POA: Diagnosis not present

## 2017-07-02 ENCOUNTER — Encounter: Payer: Self-pay | Admitting: *Deleted

## 2017-07-05 ENCOUNTER — Other Ambulatory Visit: Payer: Medicare Other

## 2017-07-05 DIAGNOSIS — Z87440 Personal history of urinary (tract) infections: Secondary | ICD-10-CM

## 2017-07-05 LAB — URINALYSIS, COMPLETE
Bilirubin, UA: NEGATIVE
Glucose, UA: NEGATIVE
KETONES UA: NEGATIVE
Leukocytes, UA: NEGATIVE
NITRITE UA: NEGATIVE
PH UA: 7.5 (ref 5.0–7.5)
Protein, UA: NEGATIVE
RBC, UA: NEGATIVE
SPEC GRAV UA: 1.015 (ref 1.005–1.030)
Urobilinogen, Ur: 0.2 mg/dL (ref 0.2–1.0)

## 2017-07-05 LAB — MICROSCOPIC EXAMINATION: RBC MICROSCOPIC, UA: NONE SEEN /HPF (ref 0–2)

## 2017-07-07 ENCOUNTER — Other Ambulatory Visit: Payer: Self-pay | Admitting: *Deleted

## 2017-07-07 LAB — URINE CULTURE

## 2017-07-07 MED ORDER — CIPROFLOXACIN HCL 500 MG PO TABS: 500.0000 mg | ORAL_TABLET | Freq: Two times a day (BID) | ORAL | 0 refills | Status: DC

## 2017-07-12 ENCOUNTER — Other Ambulatory Visit: Payer: Medicare Other

## 2017-07-12 DIAGNOSIS — E559 Vitamin D deficiency, unspecified: Secondary | ICD-10-CM

## 2017-07-12 DIAGNOSIS — E7800 Pure hypercholesterolemia, unspecified: Secondary | ICD-10-CM | POA: Diagnosis not present

## 2017-07-12 DIAGNOSIS — I10 Essential (primary) hypertension: Secondary | ICD-10-CM | POA: Diagnosis not present

## 2017-07-12 DIAGNOSIS — E119 Type 2 diabetes mellitus without complications: Secondary | ICD-10-CM

## 2017-07-12 DIAGNOSIS — R300 Dysuria: Secondary | ICD-10-CM | POA: Diagnosis not present

## 2017-07-12 LAB — URINALYSIS, ROUTINE W REFLEX MICROSCOPIC
Bilirubin, UA: NEGATIVE
GLUCOSE, UA: NEGATIVE
Ketones, UA: NEGATIVE
NITRITE UA: NEGATIVE
Specific Gravity, UA: 1.02 (ref 1.005–1.030)
Urobilinogen, Ur: 0.2 mg/dL (ref 0.2–1.0)
pH, UA: 7.5 (ref 5.0–7.5)

## 2017-07-12 LAB — MICROSCOPIC EXAMINATION: Renal Epithel, UA: NONE SEEN /hpf

## 2017-07-12 LAB — BAYER DCA HB A1C WAIVED: HB A1C: 6.5 % (ref <7.0)

## 2017-07-12 NOTE — Addendum Note (Signed)
Addended by: Zannie Cove on: 07/12/2017 09:48 AM   Modules accepted: Orders

## 2017-07-13 LAB — BMP8+EGFR
BUN/Creatinine Ratio: 16 (ref 12–28)
BUN: 13 mg/dL (ref 8–27)
CO2: 23 mmol/L (ref 20–29)
CREATININE: 0.81 mg/dL (ref 0.57–1.00)
Calcium: 9.1 mg/dL (ref 8.7–10.3)
Chloride: 101 mmol/L (ref 96–106)
GFR calc Af Amer: 78 mL/min/{1.73_m2} (ref >59)
GFR, EST NON AFRICAN AMERICAN: 67 mL/min/{1.73_m2} (ref >59)
Glucose: 100 mg/dL — ABNORMAL HIGH (ref 65–99)
Potassium: 4.5 mmol/L (ref 3.5–5.2)
Sodium: 136 mmol/L (ref 134–144)

## 2017-07-13 LAB — HEPATIC FUNCTION PANEL
ALK PHOS: 72 IU/L (ref 39–117)
ALT: 14 IU/L (ref 0–32)
AST: 18 IU/L (ref 0–40)
Albumin: 4 g/dL (ref 3.5–4.7)
Bilirubin Total: 0.5 mg/dL (ref 0.0–1.2)
Bilirubin, Direct: 0.16 mg/dL (ref 0.00–0.40)
Total Protein: 6.2 g/dL (ref 6.0–8.5)

## 2017-07-13 LAB — CBC WITH DIFFERENTIAL/PLATELET
BASOS: 0 %
Basophils Absolute: 0 10*3/uL (ref 0.0–0.2)
EOS (ABSOLUTE): 0.1 10*3/uL (ref 0.0–0.4)
EOS: 1 %
HEMATOCRIT: 41 % (ref 34.0–46.6)
HEMOGLOBIN: 13.5 g/dL (ref 11.1–15.9)
IMMATURE GRANS (ABS): 0 10*3/uL (ref 0.0–0.1)
IMMATURE GRANULOCYTES: 0 %
LYMPHS: 26 %
Lymphocytes Absolute: 1.6 10*3/uL (ref 0.7–3.1)
MCH: 31.2 pg (ref 26.6–33.0)
MCHC: 32.9 g/dL (ref 31.5–35.7)
MCV: 95 fL (ref 79–97)
MONOCYTES: 10 %
Monocytes Absolute: 0.6 10*3/uL (ref 0.1–0.9)
NEUTROS PCT: 63 %
Neutrophils Absolute: 3.8 10*3/uL (ref 1.4–7.0)
Platelets: 305 10*3/uL (ref 150–379)
RBC: 4.33 x10E6/uL (ref 3.77–5.28)
RDW: 13.4 % (ref 12.3–15.4)
WBC: 6 10*3/uL (ref 3.4–10.8)

## 2017-07-13 LAB — LIPID PANEL
CHOL/HDL RATIO: 2.3 ratio (ref 0.0–4.4)
Cholesterol, Total: 107 mg/dL (ref 100–199)
HDL: 47 mg/dL (ref >39)
LDL CALC: 46 mg/dL (ref 0–99)
Triglycerides: 68 mg/dL (ref 0–149)
VLDL CHOLESTEROL CAL: 14 mg/dL (ref 5–40)

## 2017-07-13 LAB — VITAMIN D 25 HYDROXY (VIT D DEFICIENCY, FRACTURES): Vit D, 25-Hydroxy: 41.7 ng/mL (ref 30.0–100.0)

## 2017-07-14 LAB — URINE CULTURE

## 2017-07-15 ENCOUNTER — Other Ambulatory Visit: Payer: Self-pay | Admitting: *Deleted

## 2017-07-15 ENCOUNTER — Other Ambulatory Visit: Payer: Self-pay | Admitting: Family Medicine

## 2017-07-15 ENCOUNTER — Telehealth: Payer: Self-pay | Admitting: Family Medicine

## 2017-07-15 DIAGNOSIS — N390 Urinary tract infection, site not specified: Secondary | ICD-10-CM

## 2017-07-15 DIAGNOSIS — L03221 Cellulitis of neck: Principal | ICD-10-CM

## 2017-07-15 DIAGNOSIS — L0211 Cutaneous abscess of neck: Secondary | ICD-10-CM

## 2017-07-15 MED ORDER — CEFDINIR 300 MG PO CAPS: 300.0000 mg | ORAL_CAPSULE | Freq: Two times a day (BID) | ORAL | 0 refills | Status: DC

## 2017-07-15 NOTE — Telephone Encounter (Signed)
Please see result note and pt is aware of Omnicef being sent in per result note.

## 2017-07-16 ENCOUNTER — Telehealth: Payer: Self-pay | Admitting: Family Medicine

## 2017-07-16 DIAGNOSIS — J329 Chronic sinusitis, unspecified: Secondary | ICD-10-CM

## 2017-07-16 NOTE — Telephone Encounter (Signed)
Daughter aware of urine and referral

## 2017-07-19 ENCOUNTER — Other Ambulatory Visit: Payer: Self-pay | Admitting: Family Medicine

## 2017-07-22 ENCOUNTER — Encounter: Payer: Self-pay | Admitting: *Deleted

## 2017-08-12 DIAGNOSIS — E119 Type 2 diabetes mellitus without complications: Secondary | ICD-10-CM | POA: Diagnosis not present

## 2017-08-13 ENCOUNTER — Ambulatory Visit: Payer: Medicare Other | Admitting: Family Medicine

## 2017-08-16 ENCOUNTER — Encounter: Payer: Self-pay | Admitting: Family Medicine

## 2017-08-16 ENCOUNTER — Ambulatory Visit: Payer: Medicare Other | Admitting: Family Medicine

## 2017-08-16 ENCOUNTER — Ambulatory Visit (INDEPENDENT_AMBULATORY_CARE_PROVIDER_SITE_OTHER): Payer: Medicare Other

## 2017-08-16 VITALS — BP 113/56 | HR 67 | Temp 97.1°F | Ht 62.0 in | Wt 153.0 lb

## 2017-08-16 DIAGNOSIS — Z780 Asymptomatic menopausal state: Secondary | ICD-10-CM

## 2017-08-16 DIAGNOSIS — J449 Chronic obstructive pulmonary disease, unspecified: Secondary | ICD-10-CM | POA: Diagnosis not present

## 2017-08-16 DIAGNOSIS — E7800 Pure hypercholesterolemia, unspecified: Secondary | ICD-10-CM

## 2017-08-16 DIAGNOSIS — R05 Cough: Secondary | ICD-10-CM | POA: Diagnosis not present

## 2017-08-16 DIAGNOSIS — E559 Vitamin D deficiency, unspecified: Secondary | ICD-10-CM | POA: Diagnosis not present

## 2017-08-16 DIAGNOSIS — Z13820 Encounter for screening for osteoporosis: Secondary | ICD-10-CM

## 2017-08-16 DIAGNOSIS — E785 Hyperlipidemia, unspecified: Secondary | ICD-10-CM | POA: Diagnosis not present

## 2017-08-16 DIAGNOSIS — I10 Essential (primary) hypertension: Secondary | ICD-10-CM | POA: Diagnosis not present

## 2017-08-16 DIAGNOSIS — N390 Urinary tract infection, site not specified: Secondary | ICD-10-CM

## 2017-08-16 DIAGNOSIS — F439 Reaction to severe stress, unspecified: Secondary | ICD-10-CM | POA: Diagnosis not present

## 2017-08-16 DIAGNOSIS — E1169 Type 2 diabetes mellitus with other specified complication: Secondary | ICD-10-CM | POA: Diagnosis not present

## 2017-08-16 DIAGNOSIS — R059 Cough, unspecified: Secondary | ICD-10-CM

## 2017-08-16 DIAGNOSIS — E119 Type 2 diabetes mellitus without complications: Secondary | ICD-10-CM | POA: Diagnosis not present

## 2017-08-16 NOTE — Progress Notes (Signed)
Subjective:    Patient ID: [name] [name], female    DOB: 11/18/1931, 84 y.o.   MRN: [id number]  HPI Pt here for follow up and management of chronic medical problems which includes diabetes, hypertension and hyperlipidemia. He is taking medication regularly.  Patient today complains of some swelling in her right lower leg.  She also complains with a cough.  She will get her DEXA scan today and will be given an FOBT to return.  She has had lab work done and we will review this with her during the visit today.  All cholesterol numbers with traditional lipid testing were excellent and at goal.  The blood sugar was slightly increased at 100.  The creatinine and all of the electrolytes were normal.  The CBC was within normal limits with a normal white blood cell count, a good hemoglobin and adequate platelet count.  The vitamin D level was within normal limits at 41.7.  All liver function tests were normal.  The patient did have 3+ leukocytes on the recent dipstick.  A urine culture and sensitivity did grow out Proteus.  She was placed on Omnicef for this.  The actual urinalysis had greater than 30 WBC and this was 1 month ago.  The hemoglobin A1c was 6.5% and previously was 6.0%.  She is pleasant and alert and some of the stress issues that she was having in her church seem to have settled down.  She does bring in blood sugars for review and all of these were good and they will be scanned into the record today.  She is going to get a DEXA scan today.  She denies any chest pain pressure or tightness.  She has had slightly more wheezing than usual and this would be expected with all the pollen.  I was encouraging her to use her Symbicort regularly 2 puffs twice daily and rinse the mouth after using and continue with the rescue inhaler or albuterol.  She stays on Mucinex regularly.  She denies any trouble with her stomach including nausea vomiting diarrhea blood in the stool or black tarry bowel movements.   Currently she is not having any trouble passing her water and has completed the round of antibiotics and because of recurrent urinary tract infections does have a visit scheduled with a urologist in [place] later this month.   Patient Active Problem List   Diagnosis Date Noted  . Type 2 diabetes mellitus with hyperlipidemia ([place]) 09/26/2015  . COPD (chronic obstructive pulmonary disease) ([place] [place]) 05/29/2015  . Type 2 diabetes mellitus, controlled ([place]) 08/30/2014  . Rectal bleeding 03/07/2014  . History of total left hip arthroplasty 01/25/2014  . Elevated liver enzymes 01/13/2014  . Edema 01/02/2014  . Hypoxia 01/02/2014  . S/P total hip arthroplasty 12/27/2013  . Hyperlipidemia   . Anxiety   . Depressive disorder, not elsewhere classified   . Rhinitis   . Atrophic vaginitis   . Postmenopausal    Outpatient Encounter Medications as of 08/16/2017  Medication Sig  . albuterol (PROVENTIL HFA;VENTOLIN HFA) 108 (90 Base) MCG/ACT inhaler Inhale 2 puffs into the lungs every 6 (six) hours as needed for wheezing or shortness of breath.  Marland Kitchen albuterol (PROVENTIL) (2.5 MG/3ML) 0.083% nebulizer solution NEBULIZE 1 VIAL EVERY 6 HOURS AS NEEDED FOR SHORTNESS OF BREATH OR WHEEZING  . ALPRAZolam (XANAX) 0.25 MG tablet TAKE 1/2 TABLET FOUR TIMES DAILY AS NEEDED  . amLODipine (NORVASC) 2.5 MG tablet Take 1 Tablet by mouth once daily  .  aspirin 81 MG tablet Take 81 mg by mouth every other day.  . blood glucose meter kit and supplies KIT Check BS once daily  . budesonide-formoterol (SYMBICORT) 160-4.5 MCG/ACT inhaler Inhale 2 puffs into the lungs 2 (two) times daily.  . cholecalciferol (VITAMIN D) 1000 UNITS tablet Take 2,000 Units by mouth daily.  Marland Kitchen escitalopram (LEXAPRO) 10 MG tablet TAKE 1 TABLET DAILY AS DIRECTED  . fluticasone (FLONASE) 50 MCG/ACT nasal spray USE ONE SPRAY in each nostril ONCE daily  . gabapentin (NEURONTIN) 100 MG capsule take 1  Capsule by mouth 3 times daily (Patient taking  differently: 1 capsule qhs)  . glucose blood (ONETOUCH VERIO) test strip USE 1 STRIP TO CHECK GLUCOSE TWICE DAILY AND AS NEEDED  . hydrocortisone 1 % ointment Apply 1 application topically 2 (two) times daily.  Marland Kitchen ketoconazole (NIZORAL) 2 % shampoo Apply 1 application topically 2 (two) times a week.  Elmore Guise Devices (ONE TOUCH DELICA LANCING DEV) MISC Use to check BG up to twice a day. DX: E11.29 - type 2 DM  . lovastatin (MEVACOR) 40 MG tablet Take 1 Tablet by mouth once daily  . metFORMIN (GLUCOPHAGE-XR) 500 MG 24 hr tablet Take 1 Tablet by mouth once daily with breakfast  . ONETOUCH DELICA LANCETS 08M MISC Use to check BG once a day to every other day Dx: E 11.9  . [DISCONTINUED] calcium carbonate (OS-CAL) 600 MG TABS tablet Take 600 mg by mouth 2 (two) times daily with a meal.  . [DISCONTINUED] cefdinir (OMNICEF) 300 MG capsule Take 1 capsule (300 mg total) by mouth 2 (two) times daily. 1 po BID  . [DISCONTINUED] cefdinir (OMNICEF) 300 MG capsule Take 1 capsule (300 mg total) by mouth 2 (two) times daily. 1 po BID  . [DISCONTINUED] ciprofloxacin (CIPRO) 500 MG tablet Take 1 tablet (500 mg total) by mouth 2 (two) times daily.  . [DISCONTINUED] mupirocin ointment (BACTROBAN) 2 % APPLY TO AFFECTED AREAS TWICE A DAY   No facility-administered encounter medications on file as of 08/16/2017.       Review of Systems  Constitutional: Negative.   HENT: Negative.   Eyes: Negative.   Respiratory: Positive for cough.   Cardiovascular: Positive for leg swelling (right lower leg swelling ).  Gastrointestinal: Negative.   Endocrine: Negative.   Genitourinary: Negative.   Musculoskeletal: Negative.   Skin: Negative.   Allergic/Immunologic: Negative.   Neurological: Negative.   Hematological: Negative.   Psychiatric/Behavioral: Negative.        Objective:   Physical Exam  Constitutional: She is oriented to person, place, and time. She appears well-developed and well-nourished. No distress.    The patient is pleasant and relaxed and seems to be calmer than she has been at previous visits.  HENT:  Head: Normocephalic and atraumatic.  Right Ear: External ear normal.  Left Ear: External ear normal.  Mouth/Throat: Oropharynx is clear and moist.  Nasal turbinate congestion bilaterally  Eyes: Pupils are equal, round, and reactive to light. Conjunctivae and EOM are normal. Right eye exhibits no discharge. Left eye exhibits no discharge.  Neck: Normal range of motion. Neck supple. No thyromegaly present.  No bruits thyromegaly or anterior cervical adenopathy  Cardiovascular: Normal rate, regular rhythm, normal heart sounds and intact distal pulses.  No murmur heard. Heart is regular at 72/min  Pulmonary/Chest: Effort normal and breath sounds normal. No respiratory distress. She has no wheezes. She has no rales.  Dry cough.  Rare wheeze.  No rhonchi.  Abdominal: Soft.  Bowel sounds are normal. She exhibits no mass. There is no tenderness.  Musculoskeletal: Normal range of motion. She exhibits no edema or tenderness.  Moving is slow and somewhat hesitant due to her back and hip issues and hip surgery from the past.  Range of motion is within normal limits.  Lymphadenopathy:    She has no cervical adenopathy.  Neurological: She is alert and oriented to person, place, and time. She has normal reflexes. No cranial nerve deficit.  Skin: Skin is warm and dry. No rash noted.  The rash around her neck seems to be diminished this time compared to the past.  Psychiatric: She has a normal mood and affect. Her behavior is normal. Judgment and thought content normal.  The patient appears to be calmer today than usual and is alert and pleasant.  Nursing note and vitals reviewed.  BP (!) 113/56 (BP Location: Left Arm)   Pulse 67   Temp (!) 97.1 F (36.2 C) (Oral)   Ht 5' 2"  (1.575 m)   Wt 153 lb (69.4 kg)   BMI 27.98 kg/m         Assessment & Plan:  1. Type 2 diabetes mellitus without  complication, without long-term current use of insulin (H[facility]) -The blood sugar was 100.  The hemoglobin A1c was still good below 7% but slightly higher than it has been in the past.  She will continue to watch her diet closely and exercise regularly and drink plenty of fluids  2. Pure hypercholesterolemia -All cholesterol numbers are good and she will stay with current treatment  3. Vitamin D deficiency -Continue with vitamin D replacement  4. Essential hypertension -Blood pressure is good today and she will continue with current treatment  5. Cough -Use inhalers regularly and rescue inhaler as needed and stay on Mucinex twice daily  6. Recurrent UTI -Patient has an appointment for follow-up with urologist because of recurrent urinary tract infections.  She is having no symptoms today and no urine will be checked today.  7. Situational stress -This seems to be better  8. Type 2 diabetes mellitus with hyperlipidemia ([place]) -10 you aggressive therapeutic lifestyle changes and current treatment and monitoring of sugar.  9. Chronic obstructive pulmonary disease, unspecified COPD type ([place]) -Airway protection, use inhalers regularly and take Mucinex regularly and avoid irritating environments as much as possible   Patient Instructions                       Medicare Annual Wellness Visit  [facility] and the medical providers at [facility] strive to bring you the best medical care.  In doing so we not only want to address your current medical conditions and concerns but also to detect new conditions early and prevent illness, disease and health-related problems.    Medicare offers a yearly Wellness Visit which allows our clinical staff to assess your need for preventative services including immunizations, lifestyle education, counseling to decrease risk of preventable diseases and screening for fall risk and other medical concerns.    This visit is provided free of  charge (no copay) for all Medicare recipients. The clinical pharmacists at [facility] have begun to conduct these Wellness Visits which will also include a thorough review of all your medications.    As you primary medical provider recommend that you make an appointment for your Annual Wellness Visit if you have not done so already this year.  You may set  up this appointment before you leave today or you may call back ([phone]) and schedule an appointment.  Please make sure when you call that you mention that you are scheduling your Annual Wellness Visit with the clinical pharmacist so that the appointment may be made for the proper length of time.     Continue current medications. Continue good therapeutic lifestyle changes which include good diet and exercise. Fall precautions discussed with patient. If an FOBT was given today- please return it to our front desk. If you are over 77 years old - you may need Prevnar 24 or the adult Pneumonia vaccine.  **Flu shots are available--- please call and schedule a FLU-CLINIC appointment**  After your visit with [place] today you will receive a survey in the mail or online from [employer] regarding your care with [place]. Please take a moment to fill this out. Your feedback is very important to [place] as you can help [place] better understand your patient needs as well as improve your experience and satisfaction. WE CARE ABOUT YOU!!!   Avoid irritating environments and use inhalers regularly along with Mucinex twice daily with a large glass of water Continue to drink plenty of fluids and stay well-hydrated Follow-up with urology appointment as planned Continue to check blood sugars regularly and stay as active as physically possible.  Arrie Senate MD  .

## 2017-08-16 NOTE — Patient Instructions (Addendum)
Medicare Annual Wellness Visit  [facility] and the medical providers at [facility] strive to bring you the best medical care.  In doing so we not only want to address your current medical conditions and concerns but also to detect new conditions early and prevent illness, disease and health-related problems.    Medicare offers a yearly Wellness Visit which allows our clinical staff to assess your need for preventative services including immunizations, lifestyle education, counseling to decrease risk of preventable diseases and screening for fall risk and other medical concerns.    This visit is provided free of charge (no copay) for all Medicare recipients. The clinical pharmacists at [facility] have begun to conduct these Wellness Visits which will also include a thorough review of all your medications.    As you primary medical provider recommend that you make an appointment for your Annual Wellness Visit if you have not done so already this year.  You may set up this appointment before you leave today or you may call back ([phone]) and schedule an appointment.  Please make sure when you call that you mention that you are scheduling your Annual Wellness Visit with the clinical pharmacist so that the appointment may be made for the proper length of time.     Continue current medications. Continue good therapeutic lifestyle changes which include good diet and exercise. Fall precautions discussed with patient. If an FOBT was given today- please return it to our front desk. If you are over 82 years old - you may need Prevnar 80 or the adult Pneumonia vaccine.  **Flu shots are available--- please call and schedule a FLU-CLINIC appointment**  After your visit with [place] today you will receive a survey in the mail or online from [employer] regarding your care with [place]. Please take a moment to fill this out. Your feedback is very  important to [place] as you can help [place] better understand your patient needs as well as improve your experience and satisfaction. WE CARE ABOUT YOU!!!   Avoid irritating environments and use inhalers regularly along with Mucinex twice daily with a large glass of water Continue to drink plenty of fluids and stay well-hydrated Follow-up with urology appointment as planned Continue to check blood sugars regularly and stay as active as physically possible.

## 2017-08-18 ENCOUNTER — Other Ambulatory Visit: Payer: Medicare Other

## 2017-08-18 DIAGNOSIS — Z1211 Encounter for screening for malignant neoplasm of colon: Secondary | ICD-10-CM

## 2017-08-19 ENCOUNTER — Other Ambulatory Visit: Payer: Self-pay | Admitting: Family Medicine

## 2017-08-19 DIAGNOSIS — Z780 Asymptomatic menopausal state: Secondary | ICD-10-CM | POA: Diagnosis not present

## 2017-08-19 DIAGNOSIS — Z13820 Encounter for screening for osteoporosis: Secondary | ICD-10-CM | POA: Diagnosis not present

## 2017-08-19 LAB — FECAL OCCULT BLOOD, IMMUNOCHEMICAL: FECAL OCCULT BLD: NEGATIVE

## 2017-08-25 ENCOUNTER — Other Ambulatory Visit: Payer: Self-pay | Admitting: Family Medicine

## 2017-09-03 ENCOUNTER — Other Ambulatory Visit (HOSPITAL_COMMUNITY)
Admission: RE | Admit: 2017-09-03 | Discharge: 2017-09-03 | Disposition: A | Discharge status: Home / Self Care | Payer: Medicare Other | Source: Other Acute Inpatient Hospital | Attending: Urology | Admitting: Urology

## 2017-09-03 ENCOUNTER — Ambulatory Visit: Payer: Medicare Other | Admitting: Urology

## 2017-09-03 DIAGNOSIS — N3944 Nocturnal enuresis: Secondary | ICD-10-CM | POA: Diagnosis not present

## 2017-09-03 DIAGNOSIS — Z87440 Personal history of urinary (tract) infections: Secondary | ICD-10-CM

## 2017-09-05 LAB — URINE CULTURE: Culture: >=100000 — AB

## 2017-09-07 ENCOUNTER — Other Ambulatory Visit: Payer: Self-pay | Admitting: Urology

## 2017-09-07 DIAGNOSIS — Z87440 Personal history of urinary (tract) infections: Secondary | ICD-10-CM

## 2017-09-08 ENCOUNTER — Other Ambulatory Visit: Payer: Self-pay | Admitting: Family Medicine

## 2017-09-14 ENCOUNTER — Ambulatory Visit (HOSPITAL_COMMUNITY)
Admission: RE | Admit: 2017-09-14 | Discharge: 2017-09-14 | Disposition: A | Discharge status: Home / Self Care | Payer: Medicare Other | Source: Ambulatory Visit | Attending: Urology | Admitting: Urology

## 2017-09-14 DIAGNOSIS — N390 Urinary tract infection, site not specified: Secondary | ICD-10-CM | POA: Diagnosis not present

## 2017-09-14 DIAGNOSIS — Z87440 Personal history of urinary (tract) infections: Secondary | ICD-10-CM | POA: Insufficient documentation

## 2017-09-16 ENCOUNTER — Other Ambulatory Visit: Payer: Self-pay | Admitting: Family Medicine

## 2017-09-17 ENCOUNTER — Telehealth: Payer: Self-pay | Admitting: Family Medicine

## 2017-09-17 NOTE — Telephone Encounter (Signed)
Pt called - she is aware to call DR Denton Lank office  - ph # given.

## 2017-10-05 ENCOUNTER — Ambulatory Visit: Payer: Medicare Other | Admitting: Family Medicine

## 2017-10-05 ENCOUNTER — Encounter: Payer: Self-pay | Admitting: Family Medicine

## 2017-10-05 VITALS — BP 131/56 | HR 66 | Temp 98.1°F | Ht 62.0 in | Wt 153.0 lb

## 2017-10-05 DIAGNOSIS — J441 Chronic obstructive pulmonary disease with (acute) exacerbation: Secondary | ICD-10-CM

## 2017-10-05 MED ORDER — BUDESONIDE-FORMOTEROL FUMARATE 160-4.5 MCG/ACT IN AERO
2.0000 | INHALATION_SPRAY | Freq: Two times a day (BID) | RESPIRATORY_TRACT | 10 refills | Status: AC
Start: 2017-10-05 — End: ?

## 2017-10-05 MED ORDER — CEPHALEXIN 500 MG PO CAPS: 500.0000 mg | ORAL_CAPSULE | Freq: Four times a day (QID) | ORAL | 0 refills | Status: DC

## 2017-10-05 MED ORDER — PREDNISONE 20 MG PO TABS: ORAL_TABLET | ORAL | 0 refills | Status: DC

## 2017-10-05 NOTE — Progress Notes (Signed)
BP (!) 131/56   Pulse 66   Temp 98.1 F (36.7 C) (Oral)   Ht 5' 2"  (1.575 m)   Wt 153 lb (69.4 kg)   SpO2 95%   BMI 27.98 kg/m    Subjective:    Patient ID: [name] [name], female    DOB: November 08, 1931, 84 y.o.   MRN: [id number]  HPI: [name] [name] is a 84 y.o. female presenting on 10/05/2017 for Cough, wheezing, runny nose (x 3 days)   HPI Cough and congestion and wheezing and sinus pressure Patient comes in complaining of cough and congestion and wheezing and sinus pressure this been going on for the past 2 days.  She has been using Mucinex and albuterol and she has been noticing that she is been wheezing more and the sinus pressure is gotten worse.  She denies any fevers or chills or shortness of breath.  She feels like the albuterol does help some but only to a certain point.  She says that her friends came over and they had some sinus illnesses.  Relevant past medical, surgical, family and social history reviewed and updated as indicated. Interim medical history since our last visit reviewed. Allergies and medications reviewed and updated.  Review of Systems  Constitutional: Negative for chills and fever.  HENT: Positive for congestion, postnasal drip, rhinorrhea, sinus pressure, sneezing and sore throat. Negative for ear discharge and ear pain.   Eyes: Negative for pain, redness and visual disturbance.  Respiratory: Positive for cough and wheezing. Negative for chest tightness and shortness of breath.   Cardiovascular: Negative for chest pain and leg swelling.  Genitourinary: Negative for difficulty urinating and dysuria.  Musculoskeletal: Negative for back pain and gait problem.  Skin: Negative for rash.  Neurological: Negative for light-headedness and headaches.  Psychiatric/Behavioral: Negative for agitation and behavioral problems.  All other systems reviewed and are negative.   Per HPI unless specifically indicated above   Allergies as of 10/05/2017    Reactions   Lisinopril Cough   Chlordiazepoxide-clidinium    Can't remember   Clarithromycin    Can't remember   Erymax [erythromycin]    Can't remember   Penicillins    Can't remember   Betadine [povidone Iodine] Rash   Evista [raloxifene Hydrochloride]    Numb feeling   Hydrogen Peroxide Rash   Sertraline Hcl Other (See Comments)   Sweating and mouth irritation   Sulfa Antibiotics Nausea And Vomiting      Medication List        Accurate as of 10/05/17  4:23 PM. Always use your most recent med list.          albuterol 108 (90 Base) MCG/ACT inhaler Commonly known as:  PROVENTIL HFA;VENTOLIN HFA Inhale 2 puffs into the lungs every 6 (six) hours as needed for wheezing or shortness of breath.   albuterol (2.5 MG/3ML) 0.083% nebulizer solution Commonly known as:  PROVENTIL NEBULIZE 1 VIAL EVERY 6 HOURS AS NEEDED FOR SHORTNESS OF BREATH OR WHEEZING   ALPRAZolam 0.25 MG tablet Commonly known as:  XANAX TAKE 1/2 TABLET FOUR TIMES DAILY AS NEEDED   amLODipine 2.5 MG tablet Commonly known as:  NORVASC Take 1 Tablet by mouth once daily   aspirin 81 MG tablet Take 81 mg by mouth every other day.   blood glucose meter kit and supplies Kit Check BS once daily   budesonide-formoterol 160-4.5 MCG/ACT inhaler Commonly known as:  SYMBICORT Inhale 2 puffs into the lungs 2 (two) times daily.  cephALEXin 500 MG capsule Commonly known as:  KEFLEX Take 1 capsule (500 mg total) by mouth 4 (four) times daily.   cholecalciferol 1000 units tablet Commonly known as:  VITAMIN D Take 2,000 Units by mouth daily.   escitalopram 10 MG tablet Commonly known as:  LEXAPRO TAKE 1 TABLET DAILY AS DIRECTED   fluticasone 50 MCG/ACT nasal spray Commonly known as:  FLONASE USE ONE SPRAY in each nostril ONCE daily   gabapentin 100 MG capsule Commonly known as:  NEURONTIN take 1  Capsule by mouth 3 times daily   gabapentin 100 MG capsule Commonly known as:  NEURONTIN TAKE (1) CAPSULE  THREE TIMES DAILY.   glucose blood test strip Commonly known as:  ONETOUCH VERIO USE 1 STRIP TO CHECK GLUCOSE TWICE DAILY AND AS NEEDED   hydrocortisone 1 % ointment Apply 1 application topically 2 (two) times daily.   ketoconazole 2 % shampoo Commonly known as:  NIZORAL Apply 1 application topically 2 (two) times a week.   lovastatin 40 MG tablet Commonly known as:  MEVACOR Take 1 Tablet by mouth once daily   metFORMIN 500 MG 24 hr tablet Commonly known as:  GLUCOPHAGE-XR TAKE 1 TABLET ONCE DAILY WITH BREAKFAST   mupirocin ointment 2 % Commonly known as:  BACTROBAN APPLY TO AFFECTED AREAS TWICE A DAY   ONE TOUCH DELICA LANCING DEV Misc Use to check BG up to twice a day. DX: E11.29 - type 2 DM   ONETOUCH DELICA LANCETS 94T Misc Use to check BG once a day to every other day Dx: E 11.9   predniSONE 20 MG tablet Commonly known as:  DELTASONE 2 po at same time daily for 5 days          Objective:    BP (!) 131/56   Pulse 66   Temp 98.1 F (36.7 C) (Oral)   Ht 5' 2"  (1.575 m)   Wt 153 lb (69.4 kg)   SpO2 95%   BMI 27.98 kg/m   Wt Readings from Last 3 Encounters:  10/05/17 153 lb (69.4 kg)  08/16/17 153 lb (69.4 kg)  06/23/17 148 lb (67.1 kg)    Physical Exam  Constitutional: She is oriented to person, place, and time. She appears well-developed and well-nourished. No distress.  HENT:  Right Ear: Tympanic membrane, external ear and ear canal normal.  Left Ear: Tympanic membrane, external ear and ear canal normal.  Nose: Mucosal edema and rhinorrhea present. No epistaxis. Right sinus exhibits no maxillary sinus tenderness and no frontal sinus tenderness. Left sinus exhibits no maxillary sinus tenderness and no frontal sinus tenderness.  Mouth/Throat: Uvula is midline and mucous membranes are normal. Posterior oropharyngeal edema and posterior oropharyngeal erythema present. No oropharyngeal exudate or tonsillar abscesses.  Eyes: Conjunctivae and EOM are normal.    Cardiovascular: Normal rate, regular rhythm, normal heart sounds and intact distal pulses.  No murmur heard. Pulmonary/Chest: Effort normal and breath sounds normal. No respiratory distress. She has no wheezes.  Musculoskeletal: Normal range of motion.  Neurological: She is alert and oriented to person, place, and time. Coordination normal.  Skin: Skin is warm and dry. No rash noted. She is not diaphoretic.  Psychiatric: She has a normal mood and affect. Her behavior is normal.  Vitals reviewed.       Assessment & Plan:   Problem List Items Addressed This Visit    None    Visit Diagnoses    COPD exacerbation ([place])    -  Primary  Relevant Medications   predniSONE (DELTASONE) 20 MG tablet   cephALEXin (KEFLEX) 500 MG capsule   budesonide-formoterol (SYMBICORT) 160-4.5 MCG/ACT inhaler       Follow up plan: Return if symptoms worsen or fail to improve.  Counseling provided for all of the vaccine components No orders of the defined types were placed in this encounter.   Caryl Pina, MD [facility] 10/05/2017, 4:23 PM

## 2017-10-12 ENCOUNTER — Ambulatory Visit: Payer: Medicare Other

## 2017-10-15 ENCOUNTER — Ambulatory Visit: Payer: Medicare Other | Admitting: Nurse Practitioner

## 2017-10-15 VITALS — BP 131/69 | HR 75 | Temp 97.3°F | Ht 62.0 in | Wt 153.0 lb

## 2017-10-15 DIAGNOSIS — R42 Dizziness and giddiness: Secondary | ICD-10-CM

## 2017-10-15 DIAGNOSIS — R05 Cough: Secondary | ICD-10-CM | POA: Diagnosis not present

## 2017-10-15 DIAGNOSIS — R059 Cough, unspecified: Secondary | ICD-10-CM

## 2017-10-15 MED ORDER — MECLIZINE HCL 25 MG PO TABS: 25.0000 mg | ORAL_TABLET | Freq: Three times a day (TID) | ORAL | 0 refills | Status: DC | PRN

## 2017-10-15 NOTE — Progress Notes (Signed)
   Subjective:    Patient ID: [name] [name], female    DOB: 05/08/1931, 84 y.o.   MRN: [id number]   Chief Complaint: Dizziness ("feels drunk"  for several days now) and Cough (better - just finished antibiotic (Dettinger))   HPI Patient come sin today c/o feeling drunk.started earlier in the week. Was doing better yesterday , but went to get her hair done this morning and got dizzy at hair dresser. Was on antibiotic for cough and congestion but still has light cough.   Review of Systems  Constitutional: Negative for appetite change, chills and fever.  HENT: Positive for congestion. Negative for rhinorrhea, sinus pressure, sinus pain, sore throat and trouble swallowing.   Respiratory: Positive for cough.   Genitourinary: Negative.   Neurological: Positive for headaches.  Psychiatric/Behavioral: Negative.   All other systems reviewed and are negative.      Objective:   Physical Exam  Constitutional: She is oriented to person, place, and time. She appears well-developed and well-nourished. She appears distressed (mild).  HENT:  Right Ear: Hearing, tympanic membrane, external ear and ear canal normal.  Left Ear: Hearing, tympanic membrane, external ear and ear canal normal.  Nose: Rhinorrhea present. Right sinus exhibits no maxillary sinus tenderness. Left sinus exhibits no maxillary sinus tenderness.  Mouth/Throat: Uvula is midline, oropharynx is clear and moist and mucous membranes are normal.  Cardiovascular: Normal rate and regular rhythm.  Pulmonary/Chest: Effort normal and breath sounds normal.  Neurological: She is alert and oriented to person, place, and time.  Skin: Skin is warm and dry.  Psychiatric: She has a normal mood and affect. Her behavior is normal. Judgment and thought content normal.   BP 131/69 (BP Location: Left Arm)   Pulse 75   Temp (!) 97.3 F (36.3 C) (Oral)   Ht 5\' 2"  (1.575 m)   Wt 153 lb (69.4 kg)   BMI 27.98 kg/m         Assessment & Plan:    [name] [name] in today with chief complaint of Dizziness ("feels drunk"  for several days now) and Cough (better - just finished antibiotic (Dettinger))   1. Cough Rest Continue flonase Avoid going outside when it is really hot  2. Vertigo Rest Fall precautions RTO prn - meclizine (ANTIVERT) 25 MG tablet; Take 1 tablet (25 mg total) by mouth 3 (three) times daily as needed for dizziness.  Dispense: 30 tablet; Refill: 0  Mary-Margaret Hassell Done, FNP

## 2017-10-15 NOTE — Patient Instructions (Signed)

## 2017-10-18 ENCOUNTER — Other Ambulatory Visit: Payer: Self-pay | Admitting: Family Medicine

## 2017-10-18 DIAGNOSIS — F419 Anxiety disorder, unspecified: Secondary | ICD-10-CM

## 2017-10-20 ENCOUNTER — Other Ambulatory Visit: Payer: Self-pay | Admitting: *Deleted

## 2017-10-20 ENCOUNTER — Telehealth: Payer: Self-pay | Admitting: Family Medicine

## 2017-10-20 MED ORDER — ALPRAZOLAM 0.25 MG PO TABS: ORAL_TABLET | ORAL | 3 refills | Status: DC

## 2017-10-20 NOTE — Telephone Encounter (Signed)
Please refill Xanax and or do prescription and I will sign and apologize to her for the delay

## 2017-10-20 NOTE — Telephone Encounter (Signed)
Covering PCP- please advise  

## 2017-10-20 NOTE — Telephone Encounter (Signed)
Pt notified of RX 

## 2017-10-20 NOTE — Telephone Encounter (Signed)
Please review and advise.

## 2017-10-20 NOTE — Progress Notes (Signed)
RX okayed per [facility] called into [facility]

## 2017-11-11 ENCOUNTER — Other Ambulatory Visit: Payer: Self-pay | Admitting: Family Medicine

## 2017-11-11 ENCOUNTER — Encounter: Payer: Self-pay | Admitting: Family

## 2017-11-11 ENCOUNTER — Ambulatory Visit: Payer: Medicare Other | Admitting: Family

## 2017-11-11 VITALS — BP 139/67 | HR 85 | Temp 97.0°F | Wt 153.4 lb

## 2017-11-11 DIAGNOSIS — L232 Allergic contact dermatitis due to cosmetics: Secondary | ICD-10-CM

## 2017-11-11 MED ORDER — TRIAMCINOLONE ACETONIDE 0.5 % EX OINT: 1.0000 "application " | TOPICAL_OINTMENT | Freq: Two times a day (BID) | CUTANEOUS | 0 refills | Status: DC

## 2017-11-11 NOTE — Patient Instructions (Signed)

## 2017-11-11 NOTE — Progress Notes (Signed)
   Subjective:    Patient ID: [name] [name], female    DOB: 12-08-1931, 84 y.o.   MRN: [id number]  Chief Complaint  Patient presents with  . Rash    neck x 2 days, itchy    Rash  This is a new problem. The current episode started in the past 7 days. The problem has been gradually worsening since onset. The affected locations include the neck. Associated with: applied 10% benzoyl peroxide for two day on her neck. Pertinent negatives include no congestion, cough, diarrhea, fever, shortness of breath or sore throat. The treatment provided no relief.      Review of Systems  Constitutional: Negative for fever.  HENT: Negative for congestion and sore throat.   Respiratory: Negative for cough and shortness of breath.   Gastrointestinal: Negative for diarrhea.  Skin: Positive for rash.  All other systems reviewed and are negative.      Objective:   Physical Exam  Constitutional: She is oriented to person, place, and time. She appears well-developed and well-nourished. No distress.  HENT:  Head: Normocephalic.  Eyes: Pupils are equal, round, and reactive to light.  Neck: Normal range of motion. Neck supple. No thyromegaly present.  Cardiovascular: Normal rate, regular rhythm, normal heart sounds and intact distal pulses.  No murmur heard. Pulmonary/Chest: Effort normal and breath sounds normal. No respiratory distress. She has no wheezes.  Abdominal: Soft. Bowel sounds are normal. She exhibits no distension. There is no tenderness.  Musculoskeletal: Normal range of motion. She exhibits no edema or tenderness.  Neurological: She is alert and oriented to person, place, and time. She has normal reflexes. No cranial nerve deficit.  Skin: Skin is warm and dry. Rash (erythemas generalized rash) noted. Rash is not urticarial.     Psychiatric: She has a normal mood and affect. Her behavior is normal. Judgment and thought content normal.  Vitals reviewed.     BP 139/67 (BP Location:  Left Arm, Patient Position: Sitting, Cuff Size: Normal)   Pulse 85   Temp (!) 97 F (36.1 C) (Oral)   Wt 153 lb 6.4 oz (69.6 kg)   BMI 28.06 kg/m      Assessment & Plan:  [name] [name] was seen today for rash.  Diagnoses and all orders for this visit:  Allergic contact dermatitis due to cosmetics -     triamcinolone ointment (KENALOG) 0.5 %; Apply 1 application topically 2 (two) times daily.   Do not scratch Cool compresses Stop Benzoyl peroxide cream RTO if symptoms worsen or do not improve  Evelina Dun, FNP

## 2017-11-15 ENCOUNTER — Other Ambulatory Visit: Payer: Self-pay | Admitting: *Deleted

## 2017-11-15 DIAGNOSIS — E119 Type 2 diabetes mellitus without complications: Secondary | ICD-10-CM | POA: Diagnosis not present

## 2017-11-15 MED ORDER — GLUCOSE BLOOD VI STRP: ORAL_STRIP | 1 refills | Status: DC

## 2017-11-16 ENCOUNTER — Other Ambulatory Visit: Payer: Self-pay | Admitting: Family Medicine

## 2017-11-26 ENCOUNTER — Other Ambulatory Visit: Payer: Self-pay | Admitting: Family Medicine

## 2017-12-02 ENCOUNTER — Encounter: Payer: Medicare Other | Admitting: *Deleted

## 2017-12-06 ENCOUNTER — Other Ambulatory Visit: Payer: Medicare Other

## 2017-12-06 DIAGNOSIS — E119 Type 2 diabetes mellitus without complications: Secondary | ICD-10-CM

## 2017-12-06 DIAGNOSIS — E7800 Pure hypercholesterolemia, unspecified: Secondary | ICD-10-CM | POA: Diagnosis not present

## 2017-12-06 DIAGNOSIS — I10 Essential (primary) hypertension: Secondary | ICD-10-CM

## 2017-12-06 DIAGNOSIS — R300 Dysuria: Secondary | ICD-10-CM | POA: Diagnosis not present

## 2017-12-06 DIAGNOSIS — E559 Vitamin D deficiency, unspecified: Secondary | ICD-10-CM

## 2017-12-06 LAB — URINALYSIS
BILIRUBIN UA: NEGATIVE
GLUCOSE, UA: NEGATIVE
KETONES UA: NEGATIVE
Leukocytes, UA: NEGATIVE
Nitrite, UA: NEGATIVE
PROTEIN UA: NEGATIVE
RBC, UA: NEGATIVE
Specific Gravity, UA: 1.02 (ref 1.005–1.030)
UUROB: 0.2 mg/dL (ref 0.2–1.0)
pH, UA: 7.5 (ref 5.0–7.5)

## 2017-12-06 LAB — BAYER DCA HB A1C WAIVED: HB A1C: 6.3 % (ref <7.0)

## 2017-12-07 LAB — CBC WITH DIFFERENTIAL/PLATELET
BASOS: 1 %
Basophils Absolute: 0 10*3/uL (ref 0.0–0.2)
EOS (ABSOLUTE): 0.1 10*3/uL (ref 0.0–0.4)
Eos: 1 %
HEMATOCRIT: 44.6 % (ref 34.0–46.6)
HEMOGLOBIN: 14.9 g/dL (ref 11.1–15.9)
IMMATURE GRANS (ABS): 0 10*3/uL (ref 0.0–0.1)
Immature Granulocytes: 0 %
LYMPHS: 32 %
Lymphocytes Absolute: 2.1 10*3/uL (ref 0.7–3.1)
MCH: 30.6 pg (ref 26.6–33.0)
MCHC: 33.4 g/dL (ref 31.5–35.7)
MCV: 92 fL (ref 79–97)
Monocytes Absolute: 0.4 10*3/uL (ref 0.1–0.9)
Monocytes: 6 %
NEUTROS ABS: 4 10*3/uL (ref 1.4–7.0)
Neutrophils: 60 %
Platelets: 319 10*3/uL (ref 150–450)
RBC: 4.87 x10E6/uL (ref 3.77–5.28)
RDW: 14.7 % (ref 12.3–15.4)
WBC: 6.6 10*3/uL (ref 3.4–10.8)

## 2017-12-07 LAB — LIPID PANEL
CHOL/HDL RATIO: 2.4 ratio (ref 0.0–4.4)
Cholesterol, Total: 122 mg/dL (ref 100–199)
HDL: 51 mg/dL (ref >39)
LDL CALC: 57 mg/dL (ref 0–99)
Triglycerides: 69 mg/dL (ref 0–149)
VLDL CHOLESTEROL CAL: 14 mg/dL (ref 5–40)

## 2017-12-07 LAB — HEPATIC FUNCTION PANEL
ALT: 13 IU/L (ref 0–32)
AST: 20 IU/L (ref 0–40)
Albumin: 4.5 g/dL (ref 3.5–4.7)
Alkaline Phosphatase: 78 IU/L (ref 39–117)
Bilirubin Total: 0.6 mg/dL (ref 0.0–1.2)
Bilirubin, Direct: 0.19 mg/dL (ref 0.00–0.40)
Total Protein: 6.7 g/dL (ref 6.0–8.5)

## 2017-12-07 LAB — BMP8+EGFR
BUN/Creatinine Ratio: 13 (ref 12–28)
BUN: 11 mg/dL (ref 8–27)
CALCIUM: 10 mg/dL (ref 8.7–10.3)
CO2: 21 mmol/L (ref 20–29)
Chloride: 98 mmol/L (ref 96–106)
Creatinine, Ser: 0.86 mg/dL (ref 0.57–1.00)
GFR, EST AFRICAN AMERICAN: 72 mL/min/{1.73_m2} (ref >59)
GFR, EST NON AFRICAN AMERICAN: 63 mL/min/{1.73_m2} (ref >59)
Glucose: 97 mg/dL (ref 65–99)
POTASSIUM: 4.5 mmol/L (ref 3.5–5.2)
Sodium: 134 mmol/L (ref 134–144)

## 2017-12-07 LAB — VITAMIN D 25 HYDROXY (VIT D DEFICIENCY, FRACTURES): VIT D 25 HYDROXY: 50.2 ng/mL (ref 30.0–100.0)

## 2017-12-08 ENCOUNTER — Ambulatory Visit (INDEPENDENT_AMBULATORY_CARE_PROVIDER_SITE_OTHER): Payer: Medicare Other | Admitting: *Deleted

## 2017-12-08 ENCOUNTER — Encounter: Payer: Self-pay | Admitting: *Deleted

## 2017-12-08 VITALS — BP 114/61 | HR 64 | Ht 62.0 in | Wt 163.0 lb

## 2017-12-08 DIAGNOSIS — Z0000 Encounter for general adult medical examination without abnormal findings: Secondary | ICD-10-CM | POA: Diagnosis not present

## 2017-12-08 LAB — URINE CULTURE

## 2017-12-08 NOTE — Patient Instructions (Signed)
  Ms. Puchalski , Thank you for taking time to come for your Medicare Wellness Visit. I appreciate your ongoing commitment to your health goals. Please review the following plan we discussed and let me know if I can assist you in the future.   These are the goals we discussed: Goals    . Exercise 150 min/wk Moderate Activity       This is a list of the screening recommended for you and due dates:  Health Maintenance  Topic Date Due  . Eye exam for diabetics  06/11/2017  . Flu Shot  11/11/2017  . Urine Protein Check  11/26/2017  . Tetanus Vaccine  04/09/2018*  . Mammogram  12/09/2017  . Hemoglobin A1C  06/08/2018  . Complete foot exam   08/17/2018  . DEXA scan (bone density measurement)  08/20/2022  . Pneumonia vaccines  Completed  *Topic was postponed. The date shown is not the original due date.     Patient Instructions: Move carefully to avoid falls. Use assistive devices, such as a cane or walker if necessary.  Keep all follow up appointments with Chipper Herb, MD and any specialty appointments that you may have.  Watch your portion sizes and sip on water throughout the day. Drink enough water that your urine is a light or pale yellow.   Physical activity is important. Move carefully but aim for at least 150 minutes of moderate activity a week. If you are not able to do that at this time, work your way up slowly.  You may want to consider a membership at the [facility] or the [facility] the [employer]. They offer many helpful programs.  Visit and talk with friends/family often. Staying in touch with loved ones is important for your emotional wellbeing.   If a referral was placed you should receive a call within a week. If not, please contact our referral department at [phone].    Thank you for talking with me today and for allowing [facility] to be a partner in your healthcare.   Chong Sicilian, BSN, RN-BC

## 2017-12-08 NOTE — Progress Notes (Addendum)
  Subjective:   [name] [name] is a 84 y.o. female who presents for a Medicare Annual Wellness Visit. [name] [name] lives at home alone. Her husband passed away 2.5 years ago. She has two daughters that live locally. She sees and talks to them often. She also has help from her grandson who is her power of attorney. She prepares her own meals and cleans her home. She is not very active other than with those tasks. She enjoys reading and has been learning to knit but doesn't think she will continue to do that.   Review of Systems    Patient reports that her overall health is unchanged compared to last year.  Cardiac Risk Factors include: advanced age (>55men, >65 women);dyslipidemia;hypertension;sedentary lifestyle  Endocrinology: non-insulin dependent diabetic. Takes metformin and watches her diet. Checks blood sugar twice daily and keeps a log. No episodes of hypoglycemia.   All other systems negative       Current Medications (verified) Outpatient Encounter Medications as of 12/08/2017  Medication Sig  . amLODipine (NORVASC) 2.5 MG tablet TAKE 1 TABLET ONCE A DAY  . aspirin 81 MG tablet Take 81 mg by mouth every other day.  . blood glucose meter kit and supplies KIT Check BS once daily  . budesonide-formoterol (SYMBICORT) 160-4.5 MCG/ACT inhaler Inhale 2 puffs into the lungs 2 (two) times daily.  . cholecalciferol (VITAMIN D) 1000 UNITS tablet Take 2,000 Units by mouth daily.  . escitalopram (LEXAPRO) 10 MG tablet TAKE 1 TABLET DAILY AS DIRECTED  . fluticasone (FLONASE) 50 MCG/ACT nasal spray USE ONE SPRAY in each nostril ONCE daily  . gabapentin (NEURONTIN) 100 MG capsule TAKE (1) CAPSULE THREE TIMES DAILY. (Patient taking differently: Take 100 mg by mouth at bedtime. TAKE (1) CAPSULE THREE TIMES DAILY.)  . glucose blood (ONETOUCH VERIO) test strip USE 1 STRIP TO CHECK GLUCOSE TWICE DAILY AS NEEDED  . hydrocortisone 1 % ointment Apply 1 application topically 2 (two) times daily.  .  ketoconazole (NIZORAL) 2 % shampoo Apply 1 application topically 2 (two) times a week.  . Lancet Devices (ONE TOUCH DELICA LANCING DEV) MISC Use to check BG up to twice a day. DX: E11.29 - type 2 DM  . lovastatin (MEVACOR) 40 MG tablet Take 1 Tablet by mouth once daily  . meclizine (ANTIVERT) 25 MG tablet Take 1 tablet (25 mg total) by mouth 3 (three) times daily as needed for dizziness.  . metFORMIN (GLUCOPHAGE-XR) 500 MG 24 hr tablet TAKE 1 TABLET ONCE DAILY WITH BREAKFAST  . mupirocin ointment (BACTROBAN) 2 % APPLY TO AFFECTED AREAS TWICE A DAY  . albuterol (PROVENTIL) (2.5 MG/3ML) 0.083% nebulizer solution NEBULIZE 1 VIAL EVERY 6 HOURS AS NEEDED FOR SHORTNESS OF BREATH OR WHEEZING  . ALPRAZolam (XANAX) 0.25 MG tablet TAKE 1/2 TABLET FOUR TIMES DAILY AS NEEDED (Patient taking differently: Take 0.25 mg by mouth 2 (two) times daily. TAKE 1/2 TABLET FOUR TIMES DAILY AS NEEDED)  . triamcinolone ointment (KENALOG) 0.5 % Apply 1 application topically 2 (two) times daily.  . [DISCONTINUED] albuterol (PROVENTIL HFA;VENTOLIN HFA) 108 (90 Base) MCG/ACT inhaler Inhale 2 puffs into the lungs every 6 (six) hours as needed for wheezing or shortness of breath.  . [DISCONTINUED] ALPRAZolam (XANAX) 0.25 MG tablet TAKE 1/2 TABLET FOUR TIMES DAILY AS NEEDED  . [DISCONTINUED] gabapentin (NEURONTIN) 100 MG capsule take 1  Capsule by mouth 3 times daily (Patient not taking: Reported on 12/08/2017)  . [DISCONTINUED] ONETOUCH DELICA LANCETS 33G MISC Use to check BG   once a day to every other day Dx: E 11.9   No facility-administered encounter medications on file as of 12/08/2017.     Allergies (verified) Lisinopril; Chlordiazepoxide-clidinium; Clarithromycin; Erymax [erythromycin]; Penicillins; Betadine [povidone iodine]; Evista [raloxifene hydrochloride]; Hydrogen peroxide; Sertraline hcl; and Sulfa antibiotics   History: Past Medical History:  Diagnosis Date  . Anxiety   . Arthritis   . Atrophic vaginitis   .  Cataract   . COPD (chronic obstructive pulmonary disease) ([place])   . Depressive disorder, not elsewhere classified   . Diabetes mellitus without complication ([place])   . Disorder of bone and cartilage, unspecified   . Elevated blood sugar   . Emphysema lung ([place])   . Hypertension   . Other and unspecified hyperlipidemia   . Postmenopausal   . Rhinitis    Past Surgical History:  Procedure Laterality Date  . APPENDECTOMY  1957  . CATARACT EXTRACTION W/ INTRAOCULAR LENS IMPLANT Bilateral 2005   [place]  . COLONOSCOPY N/A 03/08/2014   Procedure: COLONOSCOPY;  Surgeon: Rogene Houston, MD;  [facility];  Service: Endoscopy;  Laterality: N/A;  . EYE SURGERY    . TOTAL HIP ARTHROPLASTY Left 12/27/2013   Procedure: LEFT TOTAL HIP ARTHROPLASTY;  Surgeon: Newt Minion, MD;  Location: [facility];  Service: Orthopedics;  Laterality: Left;   Family History  Problem Relation Age of Onset  . Arthritis Mother   . COPD Father   . Diabetes Father   . Aneurysm Sister   . Diabetes Sister   . Diabetes Sister   . Hypertension Sister   . COPD Daughter   . Fibromyalgia Daughter   . Healthy Daughter    Social History   Socioeconomic History  . Marital status: Married    Spouse name: Not on file  . Number of children: 2  . Years of education: 10  . Highest education level: 10th grade  Occupational History  . 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 Financial resource strain: Not hard at all  . Food insecurity:    Worry: Never true    Inability: Never true  . Transportation needs:    Medical: No    Non-medical: No  Tobacco Use  . Smoking status: Never Smoker  . Smokeless tobacco: Never Used  Substance and Sexual Activity  . Alcohol use: No    Alcohol/week: 0.0 standard drinks  . Drug use: No  . Sexual activity: Not Currently  Lifestyle  . Physical activity:    Days per week: 5 days    Minutes per session: 20 min  . Stress: Only a little   Relationships  . Social connections:    Talks on phone: More than three times a week    Gets together: More than three times a week    Attends religious service: More than 4 times per year    Active member of club or organization: Yes    Attends meetings of clubs or organizations: More than 4 times per year    Relationship status: Widowed  Other Topics Concern  . Not on file  Social History Narrative  . Not on file    Tobacco Use No.  Clinical Intake:  Pre-visit preparation completed: No  Pain : No/denies pain     Nutritional Status: BMI > 30  Obese Diabetes: Yes CBG done?: No Did pt. bring in CBG monitor from home?: No  How often do you need to have someone  help you when you read instructions, pamphlets, or other written materials from your doctor or pharmacy?: 1 - Never What is the last grade level you completed in school?: 10  Interpreter Needed?: No  Information entered by :: Chong Sicilian, RN   Activities of Daily Living In your present state of health, do you have any difficulty performing the following activities: 12/08/2017  Hearing? Y  Comment doesn't want haring aids  Vision? N  Difficulty concentrating or making decisions? N  Walking or climbing stairs? N  Dressing or bathing? N  Doing errands, shopping? N  Preparing Food and eating ? N  Using the Toilet? N  In the past six months, have you accidently leaked urine? Y  Do you have problems with loss of bowel control? N  Managing your Medications? N  Managing your Finances? N  Housekeeping or managing your Housekeeping? N  Some recent data might be hidden     Diet 3 home prepared meals a day. Breakfast- Ceral, orange juice, and fruit Dinner-pintos and cornbread or a sandwich Drinks only water (nothing other than water in two years)   Exercise Current Exercise Habits: The patient does not participate in regular exercise at present, Exercise limited by: neurologic condition(s);orthopedic  condition(s)   Depression Screen PHQ 2/9 Scores 12/08/2017 10/15/2017 10/05/2017 08/16/2017 06/23/2017 04/09/2017 03/02/2017  PHQ - 2 Score 0 0 0 0 0 0 0  PHQ- 9 Score - - - - - - -     Fall Risk Fall Risk  12/08/2017 10/15/2017 10/05/2017 08/16/2017 06/23/2017  Falls in the past year? _0   Number falls in past yr: - - - - -  Injury with Fall? - - - - -  Comment - - - - -  Risk Factor Category  - - - - -  Risk for fall due to : - - - - -  Follow up - - - - -    Safety Is the patient's home free of loose throw rugs in walkways, pet beds, electrical cords, etc?   yes      Grab bars in the bathroom? no      Walkin shower? no      Shower Seat? yes      Handrails on the stairs?   yes      Adequate lighting?   yes  Patient Care Team: Chipper Herb, MD as PCP - General (Family Medicine) Newt Minion, MD (Orthopedic Surgery) Clent Jacks, MD (Ophthalmology)  Hospitalizations, surgeries, and ER visits in previous 12 months No hospitalizations, ER visits, or surgeries this past year.  Objective:    Today's Vitals   12/08/17 1525  BP: 114/61  Pulse: 64  Weight: 163 lb (73.9 kg)  Height: 5' 2" (1.575 m)   Body mass index is 29.81 kg/m.  Advanced Directives 12/08/2017 11/26/2016 04/11/2016 11/26/2015 10/26/2014 03/08/2014 03/07/2014  Does Patient Have a Medical Advance Directive? No Yes No Yes No No No  Type of Advance Directive - [place];Living will - [place]  Does patient want to make changes to medical advance directive? - No - Patient declined - - - - -  Copy of [place] in Chart? - No - copy requested - No - copy requested - - -  Would patient like information on creating a medical advance directive? No - Patient declined - - - No - patient declined information - No - patient declined  information    Hearing/Vision  No hearing or vision deficits noted during visit.  Cognitive Function: MMSE - Mini  Mental State Exam 12/08/2017 11/26/2016 11/26/2015 10/26/2014  Not completed: - - - Refused  Orientation to time _0 -  Orientation to Place _1 -  Registration _2 Attention/ Calculation _3 -  Recall _4 Language- name 2 objects _5 -  Language- repeat _6 -  Language- follow 3 step command _7 -  Language- read & follow direction _8 -  Write a sentence _9 -  Copy design 1 1 0 -  Total score _10 -       Normal Cognitive Function Screening: Yes    Immunizations and Health Maintenance Immunization History  Administered Date(s) Administered  . Influenza Whole 12/12/2009  . Influenza, High Dose Seasonal PF 01/01/2016, 01/04/2017  . Influenza,inj,Quad PF,6+ Mos 02/01/2013, 12/29/2013, 01/10/2015  . Pneumococcal Conjugate-13 04/24/2013  . Pneumococcal Polysaccharide-23 01/12/2003  . Td 04/13/2006  . Zoster 03/13/2006   Health Maintenance Due  Topic Date Due  . OPHTHALMOLOGY EXAM  06/11/2017  . INFLUENZA VACCINE  11/11/2017  . URINE MICROALBUMIN  11/26/2017   Health Maintenance  Topic Date Due  . OPHTHALMOLOGY EXAM  06/11/2017  . INFLUENZA VACCINE  11/11/2017  . URINE MICROALBUMIN  11/26/2017  . TETANUS/TDAP  04/09/2018 (Originally 04/13/2016)  . MAMMOGRAM  12/09/2017  . HEMOGLOBIN A1C  06/08/2018  . FOOT EXAM  08/17/2018  . DEXA SCAN  08/20/2022  . PNA vac Low Risk Adult  Completed        Assessment:   This is a routine wellness examination for [name] [name].    Plan:    Goals    . Exercise 150 min/wk Moderate Activity        Health Maintenance Recommendations: microalbumin   Additional Screening Recommendations: Lung: Low Dose CT Chest recommended if Age 20-80 years, 30 pack-year currently smoking OR have quit w/in 15years. Patient does not qualify. Hepatitis C Screening recommended: no  Keep f/u with Chipper Herb, MD and any other specialty appointments you may have Continue current medications Move carefully to avoid  falls. Use assistive devices like a cane or walker if needed. Aim for at least 150 minutes of moderate activity a week. This can be done with chair exercises if necessary. Read or work on puzzles daily Stay connected with friends and family  Need to get eye exam report from Dr Katy Fitch  I have personally reviewed and noted the following in the patient's chart:   . Medical and social history . Use of alcohol, tobacco or illicit drugs  . Current medications and supplements . Functional ability and status . Nutritional status . Physical activity . Advanced directives . List of other physicians . Hospitalizations, surgeries, and ER visits in previous 12 months . Vitals . Screenings to include cognitive, depression, and falls . Referrals and appointments  In addition, I have reviewed and discussed with patient certain preventive protocols, quality metrics, and best practice recommendations. A written personalized care plan for preventive services as well as general preventive health recommendations were provided to patient.     Chong Sicilian, RN   12/08/2017   I have reviewed and agree with the above AWV documentation.   Arrie Senate MD

## 2017-12-10 ENCOUNTER — Other Ambulatory Visit: Payer: Self-pay | Admitting: Family Medicine

## 2017-12-17 ENCOUNTER — Encounter: Payer: Self-pay | Admitting: Family Medicine

## 2017-12-17 ENCOUNTER — Ambulatory Visit: Payer: Medicare Other | Admitting: Family Medicine

## 2017-12-17 VITALS — BP 116/52 | HR 62 | Temp 96.8°F | Ht 62.0 in | Wt 153.0 lb

## 2017-12-17 DIAGNOSIS — E7800 Pure hypercholesterolemia, unspecified: Secondary | ICD-10-CM | POA: Diagnosis not present

## 2017-12-17 DIAGNOSIS — E559 Vitamin D deficiency, unspecified: Secondary | ICD-10-CM

## 2017-12-17 DIAGNOSIS — R05 Cough: Secondary | ICD-10-CM

## 2017-12-17 DIAGNOSIS — R059 Cough, unspecified: Secondary | ICD-10-CM

## 2017-12-17 DIAGNOSIS — I10 Essential (primary) hypertension: Secondary | ICD-10-CM

## 2017-12-17 DIAGNOSIS — J441 Chronic obstructive pulmonary disease with (acute) exacerbation: Secondary | ICD-10-CM

## 2017-12-17 DIAGNOSIS — E119 Type 2 diabetes mellitus without complications: Secondary | ICD-10-CM | POA: Diagnosis not present

## 2017-12-17 LAB — GLUCOSE HEMOCUE WAIVED: Glu Hemocue Waived: 121 mg/dL — ABNORMAL HIGH (ref 65–99)

## 2017-12-17 MED ORDER — METHYLPREDNISOLONE ACETATE 80 MG/ML IJ SUSP
60.0000 mg | Freq: Once | INTRAMUSCULAR | Status: AC
  Administered 2017-12-17: 60 mg via INTRAMUSCULAR

## 2017-12-17 MED ORDER — PREDNISONE 10 MG PO TABS: ORAL_TABLET | ORAL | 0 refills | Status: DC

## 2017-12-17 NOTE — Progress Notes (Signed)
Subjective:    Patient ID: [name] [name], female    DOB: 1931-12-18, 84 y.o.   MRN: [id number]  HPI Pt here for follow up and management of chronic medical problems which includes diabetes, hypertension and hyperlipidemia. She is taking medication regularly.  This patient is pleasant.  She lives by herself but has close monitoring by HER-2 daughters.  Recent lab work will be reviewed with her during the visit today.  She does today complain of cough and head congestion and complains of sleeping a lot.  All cholesterol numbers done on recent lab work were good with an LDL C being 57 and triglycerides being good at 69.  The urinalysis that was recently done was negative for leukocytes and sugar.  The blood sugar was good at 97.  The creatinine and all of the electrolytes including potassium are good.  The CBC was excellent with a normal white blood cell count a good hemoglobin at 14.9 and an adequate platelet count.  The vitamin D level was good at 50.2.  All liver function tests were normal.  A recent urine culture only had mixed urogenital flora with no specific agent to treat.  Her hemoglobin A1c was excellent at 6.3%.  Patient brings in blood sugars for review and all of these are excellent.  That will be scanned into the record.  She comes with her daughter to the visit today.  She has been feeling well and all the lab work was reviewed and she understands everything.  She has had a cough and congestion for about 2 to 3 weeks and I am not surprised as this is typical for her with the change in seasons on her chronic bronchitis.  She has not been running any fever and coughing up any yellow sputum.  She denies any chest pain but does have a cough and shortness of breath.  She denies any trouble with her intestinal tract including nausea vomiting diarrhea trouble swallowing heartburn blood in the stool black tarry bowel movements.  She is passing her water well.  She is up-to-date on her eye  exams.    Patient Active Problem List   Diagnosis Date Noted  . Type 2 diabetes mellitus with hyperlipidemia ([place]) 09/26/2015  . COPD (chronic obstructive pulmonary disease) ([place]) 05/29/2015  . Dermatographia 05/16/2015  . Type 2 diabetes mellitus, controlled ([place]) 08/30/2014  . Rectal bleeding 03/07/2014  . History of total left hip arthroplasty 01/25/2014  . Elevated liver enzymes 01/13/2014  . Edema 01/02/2014  . Hypoxia 01/02/2014  . S/P total hip arthroplasty 12/27/2013  . Anxiety   . Depressive disorder, not elsewhere classified   . Rhinitis   . Atrophic vaginitis   . Postmenopausal    Outpatient Encounter Medications as of 12/17/2017  Medication Sig  . albuterol (PROVENTIL) (2.5 MG/3ML) 0.083% nebulizer solution NEBULIZE 1 VIAL EVERY 6 HOURS AS NEEDED FOR SHORTNESS OF BREATH OR WHEEZING  . ALPRAZolam (XANAX) 0.25 MG tablet Take 0.25 mg by mouth 2 (two) times daily as needed for anxiety.  Marland Kitchen amLODipine (NORVASC) 2.5 MG tablet TAKE 1 TABLET ONCE A DAY  . aspirin 81 MG tablet Take 81 mg by mouth every other day.  . blood glucose meter kit and supplies KIT Check BS once daily  . budesonide-formoterol (SYMBICORT) 160-4.5 MCG/ACT inhaler Inhale 2 puffs into the lungs 2 (two) times daily.  . cholecalciferol (VITAMIN D) 1000 UNITS tablet Take 2,000 Units by mouth daily.  Marland Kitchen escitalopram (LEXAPRO) 10 MG tablet TAKE 1  TABLET DAILY AS DIRECTED  . fluticasone (FLONASE) 50 MCG/ACT nasal spray USE ONE SPRAY in each nostril ONCE daily  . gabapentin (NEURONTIN) 100 MG capsule Take 100 mg by mouth at bedtime.  Marland Kitchen glucose blood (ONETOUCH VERIO) test strip USE 1 STRIP TO CHECK GLUCOSE TWICE DAILY AS NEEDED  . ketoconazole (NIZORAL) 2 % shampoo Apply 1 application topically 2 (two) times a week.  Elmore Guise Devices (ONE TOUCH DELICA LANCING DEV) MISC Use to check BG up to twice a day. DX: E11.29 - type 2 DM  . lovastatin (MEVACOR) 40 MG tablet Take 1 Tablet by mouth once daily  . meclizine  (ANTIVERT) 25 MG tablet Take 1 tablet (25 mg total) by mouth 3 (three) times daily as needed for dizziness.  . metFORMIN (GLUCOPHAGE-XR) 500 MG 24 hr tablet TAKE 1 TABLET ONCE DAILY WITH BREAKFAST  . [DISCONTINUED] ALPRAZolam (XANAX) 0.25 MG tablet TAKE 1/2 TABLET FOUR TIMES DAILY AS NEEDED (Patient taking differently: Take 0.25 mg by mouth 2 (two) times daily. TAKE 1/2 TABLET FOUR TIMES DAILY AS NEEDED)  . [DISCONTINUED] gabapentin (NEURONTIN) 100 MG capsule TAKE (1) CAPSULE THREE TIMES DAILY.  . [DISCONTINUED] hydrocortisone 1 % ointment Apply 1 application topically 2 (two) times daily.  . [DISCONTINUED] mupirocin ointment (BACTROBAN) 2 % APPLY TO AFFECTED AREAS TWICE A DAY  . [DISCONTINUED] triamcinolone ointment (KENALOG) 0.5 % Apply 1 application topically 2 (two) times daily.   No facility-administered encounter medications on file as of 12/17/2017.      Review of Systems  Constitutional: Negative.        Sleeps a lot   HENT: Positive for congestion.   Eyes: Negative.   Respiratory: Positive for cough.   Cardiovascular: Negative.   Gastrointestinal: Negative.   Endocrine: Negative.   Genitourinary: Negative.   Musculoskeletal: Negative.   Skin: Negative.   Allergic/Immunologic: Negative.   Neurological: Negative.   Hematological: Negative.   Psychiatric/Behavioral: Negative.        Objective:   Physical Exam  Constitutional: She is oriented to person, place, and time. She appears well-developed and well-nourished. No distress.  Is pleasant and doing well and comes to the visit today with her daughter.  HENT:  Head: Normocephalic and atraumatic.  Right Ear: External ear normal.  Left Ear: External ear normal.  Nose: Nose normal.  Mouth/Throat: Oropharynx is clear and moist. No oropharyngeal exudate.  Eyes: Pupils are equal, round, and reactive to light. Conjunctivae and EOM are normal. Right eye exhibits no discharge. Left eye exhibits no discharge. No scleral icterus.   Neck: Normal range of motion. Neck supple. No thyromegaly present.  Bruits thyromegaly or anterior cervical adenopathy not present  Cardiovascular: Normal rate, regular rhythm, normal heart sounds and intact distal pulses.  No murmur heard. Heart is regular at 72/min  Pulmonary/Chest: Effort normal. She has wheezes. She has no rales.  Diminished breath sounds bilaterally and rare wheezes  Abdominal: Soft. Bowel sounds are normal. She exhibits no mass. There is no tenderness. There is no rebound and no guarding.  No liver or spleen enlargement.  No epigastric tenderness.  No masses no bruits and no suprapubic tenderness.  Musculoskeletal: Normal range of motion. She exhibits edema.  Minimal pedal edema laterally  Lymphadenopathy:    She has no cervical adenopathy.  Neurological: She is alert and oriented to person, place, and time. She has normal reflexes. No cranial nerve deficit.  Reflexes are 3+ and equal bilaterally lower extremities  Skin: Skin is warm and dry. Rash  noted. There is erythema.  This patient has a history of a chronic neurodermatitis and that has flared up recently.  Psychiatric: She has a normal mood and affect. Her behavior is normal. Judgment and thought content normal.  Mood affect and behavior are normal for this patient  Nursing note and vitals reviewed.   BP (!) 116/52 (BP Location: Left Arm)   Pulse 62   Temp (!) 96.8 F (36 C) (Oral)   Ht 5' 2"  (1.575 m)   Wt 153 lb (69.4 kg)   BMI 27.98 kg/m        Assessment & Plan:  1. Vitamin D deficiency -Continue vitamin D replacement  2. Essential hypertension -Blood pressure is good today and she will continue with current treatment  3. Pure hypercholesterolemia -Recent cholesterol numbers were excellent and she will continue with current treatment and therapeutic lifestyle changes  4. Type 2 diabetes mellitus without complication, without long-term current use of insulin (HCC) -Continue to monitor  blood sugars regularly and check feet regularly - Microalbumin / creatinine urine ratio - Glucose Hemocue Waived  5. COPD exacerbation (HCC) -Prednisone 10 taper and 60 of Depo-Medrol and continue with inhalers and Mucinex and use nasal saline nose spray  6. Cough -Continue with Mucinex and inhalers - methylPREDNISolone acetate (DEPO-MEDROL) injection 60 mg  Meds ordered this encounter  Medications  . predniSONE (DELTASONE) 10 MG tablet    Sig: Take 1 tab QID x 2 days, then 1 tab TID x 2 days, then 1 tab BID x 2 days, then 1 tab QD x 2 days    Dispense:  20 tablet    Refill:  0  . methylPREDNISolone acetate (DEPO-MEDROL) injection 60 mg   Patient Instructions                       Medicare Annual Wellness Visit  [facility] and the medical providers at [facility] strive to bring you the best medical care.  In doing so we not only want to address your current medical conditions and concerns but also to detect new conditions early and prevent illness, disease and health-related problems.    Medicare offers a yearly Wellness Visit which allows our clinical staff to assess your need for preventative services including immunizations, lifestyle education, counseling to decrease risk of preventable diseases and screening for fall risk and other medical concerns.    This visit is provided free of charge (no copay) for all Medicare recipients. The clinical pharmacists at [facility] have begun to conduct these Wellness Visits which will also include a thorough review of all your medications.    As you primary medical provider recommend that you make an appointment for your Annual Wellness Visit if you have not done so already this year.  You may set up this appointment before you leave today or you may call back ([phone]) and schedule an appointment.  Please make sure when you call that you mention that you are scheduling your Annual Wellness Visit  with the clinical pharmacist so that the appointment may be made for the proper length of time.     Continue current medications. Continue good therapeutic lifestyle changes which include good diet and exercise. Fall precautions discussed with patient. If an FOBT was given today- please return it to our front desk. If you are over 40 years old - you may need Prevnar 5 or the adult Pneumonia vaccine.  **Flu shots are available--- please  call and schedule a FLU-CLINIC appointment**  After your visit with [place] today you will receive a survey in the mail or online from [employer] regarding your care with [place]. Please take a moment to fill this out. Your feedback is very important to [place] as you can help [place] better understand your patient needs as well as improve your experience and satisfaction. WE CARE ABOUT YOU!!!   Continue to exercise regularly watch diet closely and drink plenty of water Check blood sugars regularly and check feet Take prednisone as directed drink plenty of fluids use inhalers regularly and continue taking Mucinex maximum strength 1 twice daily with a large glass of water If he start running any fever or the sputum becomes purulent get back in touch with [place]  Arrie Senate MD

## 2017-12-17 NOTE — Patient Instructions (Addendum)
Medicare Annual Wellness Visit  [facility] and the medical providers at [facility] strive to bring you the best medical care.  In doing so we not only want to address your current medical conditions and concerns but also to detect new conditions early and prevent illness, disease and health-related problems.    Medicare offers a yearly Wellness Visit which allows our clinical staff to assess your need for preventative services including immunizations, lifestyle education, counseling to decrease risk of preventable diseases and screening for fall risk and other medical concerns.    This visit is provided free of charge (no copay) for all Medicare recipients. The clinical pharmacists at [facility] have begun to conduct these Wellness Visits which will also include a thorough review of all your medications.    As you primary medical provider recommend that you make an appointment for your Annual Wellness Visit if you have not done so already this year.  You may set up this appointment before you leave today or you may call back ([phone]) and schedule an appointment.  Please make sure when you call that you mention that you are scheduling your Annual Wellness Visit with the clinical pharmacist so that the appointment may be made for the proper length of time.     Continue current medications. Continue good therapeutic lifestyle changes which include good diet and exercise. Fall precautions discussed with patient. If an FOBT was given today- please return it to our front desk. If you are over 12 years old - you may need Prevnar 39 or the adult Pneumonia vaccine.  **Flu shots are available--- please call and schedule a FLU-CLINIC appointment**  After your visit with [place] today you will receive a survey in the mail or online from [employer] regarding your care with [place]. Please take a moment to fill this out. Your feedback is very  important to [place] as you can help [place] better understand your patient needs as well as improve your experience and satisfaction. WE CARE ABOUT YOU!!!   Continue to exercise regularly watch diet closely and drink plenty of water Check blood sugars regularly and check feet Take prednisone as directed drink plenty of fluids use inhalers regularly and continue taking Mucinex maximum strength 1 twice daily with a large glass of water If he start running any fever or the sputum becomes purulent get back in touch with [place]

## 2017-12-18 LAB — MICROALBUMIN / CREATININE URINE RATIO
Creatinine, Urine: 41.4 mg/dL
Microalb/Creat Ratio: <7.2 mg/g creat (ref 0.0–30.0)
Microalbumin, Urine: <3 ug/mL

## 2017-12-27 ENCOUNTER — Other Ambulatory Visit: Payer: Self-pay | Admitting: Family

## 2017-12-27 DIAGNOSIS — L232 Allergic contact dermatitis due to cosmetics: Secondary | ICD-10-CM

## 2018-01-17 ENCOUNTER — Ambulatory Visit (INDEPENDENT_AMBULATORY_CARE_PROVIDER_SITE_OTHER): Payer: Medicare Other

## 2018-01-17 DIAGNOSIS — Z23 Encounter for immunization: Secondary | ICD-10-CM

## 2018-01-19 ENCOUNTER — Telehealth: Payer: Self-pay | Admitting: Family Medicine

## 2018-01-19 NOTE — Telephone Encounter (Signed)
Take Mucinex, maximum strength, plain, blue and white in color, 1 twice daily with a large glass of water If, she starts running any fever or producing yellow sputum we will need to give her a Z-Pak because of her COPD issues.  She should use her inhalers that she has at home regularly.

## 2018-01-19 NOTE — Telephone Encounter (Signed)
Pt aware  Will let us know if worsens or fever starts

## 2018-01-19 NOTE — Telephone Encounter (Signed)
Cough- non productive Clear nasal congestion No fever   Just a flushot a few days ago  Does she need antibiotic or try something for allergies?  [employer]

## 2018-01-26 ENCOUNTER — Other Ambulatory Visit: Payer: Self-pay | Admitting: Family Medicine

## 2018-02-14 ENCOUNTER — Other Ambulatory Visit: Payer: Self-pay | Admitting: Family Medicine

## 2018-02-15 ENCOUNTER — Encounter: Payer: Self-pay | Admitting: Family Medicine

## 2018-02-15 ENCOUNTER — Ambulatory Visit: Payer: Medicare Other | Admitting: Family Medicine

## 2018-02-15 VITALS — BP 115/64 | HR 72 | Temp 98.0°F | Ht 62.0 in | Wt 150.0 lb

## 2018-02-15 DIAGNOSIS — J441 Chronic obstructive pulmonary disease with (acute) exacerbation: Secondary | ICD-10-CM | POA: Diagnosis not present

## 2018-02-15 DIAGNOSIS — R059 Cough, unspecified: Secondary | ICD-10-CM

## 2018-02-15 DIAGNOSIS — E119 Type 2 diabetes mellitus without complications: Secondary | ICD-10-CM | POA: Diagnosis not present

## 2018-02-15 DIAGNOSIS — R05 Cough: Secondary | ICD-10-CM

## 2018-02-15 MED ORDER — METHYLPREDNISOLONE ACETATE 80 MG/ML IJ SUSP: 60.0000 mg | Freq: Once | INTRAMUSCULAR | Status: DC

## 2018-02-15 MED ORDER — PREDNISONE 10 MG PO TABS: ORAL_TABLET | ORAL | 0 refills | Status: DC

## 2018-02-15 MED ORDER — CEFDINIR 300 MG PO CAPS: 300.0000 mg | ORAL_CAPSULE | Freq: Two times a day (BID) | ORAL | 0 refills | Status: DC

## 2018-02-15 MED ORDER — METHYLPREDNISOLONE ACETATE 40 MG/ML IJ SUSP
60.0000 mg | Freq: Once | INTRAMUSCULAR | Status: AC
  Administered 2018-02-15: 60 mg via INTRAMUSCULAR

## 2018-02-15 NOTE — Progress Notes (Signed)
Subjective:    Patient ID: [name] [name], female    DOB: 01/25/1932, 84 y.o.   MRN: [id number]  HPI Patient here today for cough that started one week ago.  Cough has had yellow sputum.  She is using her nebulizer treatment but is about to run out of her Symbicort.  She is also taking Mucinex.  He has had yellow sputum as I mentioned and some yellow drainage   Patient Active Problem List   Diagnosis Date Noted  . Type 2 diabetes mellitus with hyperlipidemia ([place]) 09/26/2015  . COPD (chronic obstructive pulmonary disease) ([place]) 05/29/2015  . Dermatographia 05/16/2015  . Type 2 diabetes mellitus, controlled ([place]) 08/30/2014  . Rectal bleeding 03/07/2014  . History of total left hip arthroplasty 01/25/2014  . Elevated liver enzymes 01/13/2014  . Edema 01/02/2014  . Hypoxia 01/02/2014  . S/P total hip arthroplasty 12/27/2013  . Anxiety   . Depressive disorder, not elsewhere classified   . Rhinitis   . Atrophic vaginitis   . Postmenopausal    Outpatient Encounter Medications as of 02/15/2018  Medication Sig  . albuterol (PROVENTIL) (2.5 MG/3ML) 0.083% nebulizer solution NEBULIZE 1 VIAL EVERY 6 HOURS AS NEEDED FOR SHORTNESS OF BREATH OR WHEEZING  . ALPRAZolam (XANAX) 0.25 MG tablet Take 0.25 mg by mouth 2 (two) times daily as needed for anxiety.  Marland Kitchen amLODipine (NORVASC) 2.5 MG tablet TAKE 1 TABLET ONCE A DAY  . aspirin 81 MG tablet Take 81 mg by mouth every other day.  . blood glucose meter kit and supplies KIT Check BS once daily  . budesonide-formoterol (SYMBICORT) 160-4.5 MCG/ACT inhaler Inhale 2 puffs into the lungs 2 (two) times daily.  . cholecalciferol (VITAMIN D) 1000 UNITS tablet Take 2,000 Units by mouth daily.  Marland Kitchen escitalopram (LEXAPRO) 10 MG tablet TAKE 1 TABLET DAILY AS DIRECTED  . fluticasone (FLONASE) 50 MCG/ACT nasal spray USE ONE SPRAY in each nostril ONCE daily  . gabapentin (NEURONTIN) 100 MG capsule Take 100 mg by mouth at bedtime.  Marland Kitchen glucose blood (ONETOUCH  VERIO) test strip USE 1 STRIP TO CHECK GLUCOSE TWICE DAILY AS NEEDED  . ketoconazole (NIZORAL) 2 % shampoo Apply 1 application topically 2 (two) times a week.  [employer] (ONE TOUCH DELICA LANCING DEV) MISC Use to check BG up to twice a day. DX: E11.29 - type 2 DM  . lovastatin (MEVACOR) 40 MG tablet Take 1 Tablet by mouth once daily  . meclizine (ANTIVERT) 25 MG tablet Take 1 tablet (25 mg total) by mouth 3 (three) times daily as needed for dizziness.  . metFORMIN (GLUCOPHAGE-XR) 500 MG 24 hr tablet TAKE 1 TABLET ONCE DAILY WITH BREAKFAST  . triamcinolone ointment (KENALOG) 0.5 % Apply topically 2 (two) times daily.  . [DISCONTINUED] predniSONE (DELTASONE) 10 MG tablet Take 1 tab QID x 2 days, then 1 tab TID x 2 days, then 1 tab BID x 2 days, then 1 tab QD x 2 days   No facility-administered encounter medications on file as of 02/15/2018.       Review of Systems  Constitutional: Negative.   HENT: Negative.   Eyes: Negative.   Respiratory: Positive for cough.   Cardiovascular: Negative.   Gastrointestinal: Negative.   Endocrine: Negative.   Genitourinary: Negative.   Musculoskeletal: Negative.   Skin: Negative.   Allergic/Immunologic: Negative.   Neurological: Negative.   Hematological: Negative.   Psychiatric/Behavioral: Negative.        Objective:   Physical Exam  Constitutional: She  is oriented to person, place, and time. She appears well-nourished. No distress.  The patient is elderly but alert and by herself.  HENT:  Head: Normocephalic and atraumatic.  Right Ear: External ear normal.  Left Ear: External ear normal.  Mouth/Throat: Oropharynx is clear and moist. No oropharyngeal exudate.  Nasal turbinate congestion  Eyes: Pupils are equal, round, and reactive to light. Conjunctivae and EOM are normal. Right eye exhibits no discharge. Left eye exhibits no discharge. No scleral icterus.  Neck: Normal range of motion. Neck supple. No thyromegaly present.  No  thyromegaly or anterior cervical adenopathy  Cardiovascular: Normal rate, regular rhythm and normal heart sounds.  No murmur heard. Heart is 60/min with a regular rate and rhythm  Pulmonary/Chest: Effort normal. No respiratory distress. She has wheezes. She has no rales.  Decreased air movement bilaterally with faint wheeze on inspiration  Musculoskeletal: Normal range of motion. She exhibits no edema.  Lymphadenopathy:    She has no cervical adenopathy.  Neurological: She is alert and oriented to person, place, and time. She has normal reflexes. No cranial nerve deficit.  Skin: Skin is warm and dry. No rash noted.  Psychiatric: She has a normal mood and affect. Her behavior is normal. Judgment and thought content normal.  The patient's mood affect and behavior are all normal for her.  Nursing note and vitals reviewed.   BP 115/64 (BP Location: Left Arm)   Pulse 72   Temp 98 F (36.7 C) (Oral)   Ht 5' 2"  (1.575 m)   Wt 150 lb (68 kg)   BMI 27.44 kg/m        Assessment & Plan:  1. COPD with exacerbation ([place]) -omnicef 300 twice daily -60 Depo-Medrol IM -Prednisone 10 taper -Continue with inhalers and nebulizer  2. Cough -Mucinex maximum strength, blue and white in color, 1 twice daily with a large glass of water -Continue with nasal saline -Continue with nebulizer treatment using albuterol and continue with Symbicort regularly  Patient Instructions  Continue to take Mucinex maximum strength, blue and white in color, 1 twice daily with a large glass of water for cough and congestion Continue nebulizer treatment with albuterol Continue with Symbicort 2 puffs twice daily and rinse mouth after using Continue to drink plenty of water and stay well-hydrated Take Tylenol as needed for aches pains and fever Take antibiotic as directed until completed Take prednisone as directed and until completed Watch sugars more closely while on prednisone  Arrie Senate MD

## 2018-02-15 NOTE — Patient Instructions (Addendum)
Continue to take Mucinex maximum strength, blue and white in color, 1 twice daily with a large glass of water for cough and congestion Continue nebulizer treatment with albuterol Continue with Symbicort 2 puffs twice daily and rinse mouth after using Continue to drink plenty of water and stay well-hydrated Take Tylenol as needed for aches pains and fever Take antibiotic as directed until completed Take prednisone as directed and until completed Watch sugars more closely while on prednisone

## 2018-02-23 ENCOUNTER — Other Ambulatory Visit: Payer: Self-pay | Admitting: Family Medicine

## 2018-03-15 ENCOUNTER — Other Ambulatory Visit: Payer: Self-pay | Admitting: Family Medicine

## 2018-03-15 DIAGNOSIS — F419 Anxiety disorder, unspecified: Secondary | ICD-10-CM

## 2018-03-21 ENCOUNTER — Ambulatory Visit (INDEPENDENT_AMBULATORY_CARE_PROVIDER_SITE_OTHER): Payer: Medicare Other | Admitting: Physician Assistant

## 2018-03-21 ENCOUNTER — Encounter: Payer: Self-pay | Admitting: Physician Assistant

## 2018-03-21 VITALS — BP 114/63 | HR 67 | Temp 97.2°F | Ht 62.0 in | Wt 149.8 lb

## 2018-03-21 DIAGNOSIS — H579 Unspecified disorder of eye and adnexa: Secondary | ICD-10-CM

## 2018-03-21 NOTE — Progress Notes (Signed)
BP 114/63   Pulse 67   Temp (!) 97.2 F (36.2 C) (Oral)   Ht _0  (1.575 m)   Wt 149 lb 12.8 oz (67.9 kg)   BMI 27.40 kg/m    Subjective:    Patient ID: [name] [name], female    DOB: 1931/07/09, 84 y.o.   MRN: [id number]  HPI: [name] [name] is a 84 y.o. female presenting on 03/21/2018 for white spots in both eyes 1 day ago the patient states that she was seeing a little bit of floater in her vision but that has completely resolved.  When she looked her eyes she said she can see white swollen areas and she was afraid they were an abscess.  She denies any fever or chills.  She has had no recent injuries.  She is a patient of Dr. Carolynn Sayers and had lens implants for her cataracts in 2005.   Past Medical History:  Diagnosis Date  . Anxiety   . Arthritis   . Atrophic vaginitis   . Cataract   . COPD (chronic obstructive pulmonary disease) ([place])   . Depressive disorder, not elsewhere classified   . Diabetes mellitus without complication ([place])   . Disorder of bone and cartilage, unspecified   . Elevated blood sugar   . Emphysema lung ([place])   . Hypertension   . Other and unspecified hyperlipidemia   . Postmenopausal   . Rhinitis    Relevant past medical, surgical, family and social history reviewed and updated as indicated. Interim medical history since our last visit reviewed. Allergies and medications reviewed and updated. DATA REVIEWED: CHART IN EPIC  Family History reviewed for pertinent findings.  Review of Systems  Constitutional: Negative.   HENT: Negative.   Eyes: Positive for visual disturbance. Negative for photophobia and redness.  Respiratory: Negative.   Gastrointestinal: Negative.   Genitourinary: Negative.     Allergies as of 03/21/2018      Reactions   Lisinopril Cough   Chlordiazepoxide-clidinium    Can't remember   Clarithromycin    Can't remember   Erymax [erythromycin]    Can't remember   Penicillins    Can't remember   Betadine [povidone  Iodine] Rash   Evista [raloxifene Hydrochloride]    Numb feeling   Hydrogen Peroxide Rash   Sertraline Hcl Other (See Comments)   Sweating and mouth irritation   Sulfa Antibiotics Nausea And Vomiting      Medication List        Accurate as of 03/21/18  6:12 PM. Always use your most recent med list.          albuterol (2.5 MG/3ML) 0.083% nebulizer solution Commonly known as:  PROVENTIL NEBULIZE 1 VIAL EVERY 6 HOURS AS NEEDED FOR SHORTNESS OF BREATH OR WHEEZING   ALPRAZolam 0.25 MG tablet Commonly known as:  XANAX TAKE 1/2 TABLET FOUR TIMES DAILY AS NEEDED   amLODipine 2.5 MG tablet Commonly known as:  NORVASC TAKE 1 TABLET ONCE A DAY   aspirin 81 MG tablet Take 81 mg by mouth every other day.   blood glucose meter kit and supplies Kit Check BS once daily   budesonide-formoterol 160-4.5 MCG/ACT inhaler Commonly known as:  SYMBICORT Inhale 2 puffs into the lungs 2 (two) times daily.   cholecalciferol 1000 units tablet Commonly known as:  VITAMIN D Take 2,000 Units by mouth daily.   escitalopram 10 MG tablet Commonly known as:  LEXAPRO TAKE 1 TABLET DAILY AS DIRECTED   fluticasone  50 MCG/ACT nasal spray Commonly known as:  FLONASE USE ONE SPRAY in each nostril ONCE daily   gabapentin 100 MG capsule Commonly known as:  NEURONTIN TAKE (1) CAPSULE THREE TIMES DAILY.   glucose blood test strip USE 1 STRIP TO CHECK GLUCOSE TWICE DAILY AS NEEDED   ketoconazole 2 % shampoo Commonly known as:  NIZORAL Apply 1 application topically 2 (two) times a week.   lovastatin 40 MG tablet Commonly known as:  MEVACOR Take 1 Tablet by mouth once daily   meclizine 25 MG tablet Commonly known as:  ANTIVERT Take 1 tablet (25 mg total) by mouth 3 (three) times daily as needed for dizziness.   metFORMIN 500 MG 24 hr tablet Commonly known as:  GLUCOPHAGE-XR TAKE 1 TABLET ONCE DAILY WITH BREAKFAST   ONE TOUCH DELICA LANCING DEV Misc Use to check BG up to twice a day. DX:  E11.29 - type 2 DM   triamcinolone ointment 0.5 % Commonly known as:  KENALOG Apply topically 2 (two) times daily.          Objective:    BP 114/63   Pulse 67   Temp (!) 97.2 F (36.2 C) (Oral)   Ht _0  (1.575 m)   Wt 149 lb 12.8 oz (67.9 kg)   BMI 27.40 kg/m   Allergies  Allergen Reactions  . Lisinopril Cough  . Chlordiazepoxide-Clidinium     Can't remember  . Clarithromycin     Can't remember  . Erymax [Erythromycin]     Can't remember  . Penicillins     Can't remember  . Betadine [Povidone Iodine] Rash  . Evista [Raloxifene Hydrochloride]     Numb feeling  . Hydrogen Peroxide Rash  . Sertraline Hcl Other (See Comments)    Sweating and mouth irritation   . Sulfa Antibiotics Nausea And Vomiting    Wt Readings from Last 3 Encounters:  03/21/18 149 lb 12.8 oz (67.9 kg)  02/15/18 150 lb (68 kg)  12/17/17 153 lb (69.4 kg)    Physical Exam  Constitutional: She is oriented to person, place, and time. She appears well-developed and well-nourished.  HENT:  Head: Normocephalic and atraumatic.  Eyes: Pupils are equal, round, and reactive to light. Conjunctivae and EOM are normal.    There is slightly raised white borders on both eyes.  No erythema or discharge.  Cardiovascular: Normal rate, regular rhythm, normal heart sounds and intact distal pulses.  Pulmonary/Chest: Effort normal and breath sounds normal.  Abdominal: Soft. Bowel sounds are normal.  Neurological: She is alert and oriented to person, place, and time. She has normal reflexes.  Skin: Skin is warm and dry. No rash noted.  Psychiatric: She has a normal mood and affect. Her behavior is normal. Judgment and thought content normal.    Results for orders placed or performed in visit on 12/17/17  Microalbumin / creatinine urine ratio  Result Value Ref Range   Creatinine, Urine 41.4 Not Estab. mg/dL   Microalbumin, Urine <3.0 Not Estab. ug/mL   Microalb/Creat Ratio <7.2 0.0 - 30.0 mg/g creat    Glucose Hemocue Waived  Result Value Ref Range   Glu Hemocue Waived 121 (H) 65 - 99 mg/dL      Assessment & Plan:   1. Eye lesion Reassure Refer to ophthalmologist   Continue all other maintenance medications as listed above.  Follow up plan: No follow-ups on file.  Educational handout given for [place] PA-C [place]  [place], [place] [id number] [phone]   03/21/2018, 6:12 PM

## 2018-04-12 DIAGNOSIS — Z961 Presence of intraocular lens: Secondary | ICD-10-CM | POA: Diagnosis not present

## 2018-04-12 DIAGNOSIS — H40013 Open angle with borderline findings, low risk, bilateral: Secondary | ICD-10-CM | POA: Diagnosis not present

## 2018-04-12 DIAGNOSIS — H43811 Vitreous degeneration, right eye: Secondary | ICD-10-CM | POA: Diagnosis not present

## 2018-04-12 LAB — HM DIABETES EYE EXAM

## 2018-04-26 ENCOUNTER — Telehealth: Payer: Self-pay | Admitting: Family Medicine

## 2018-04-30 ENCOUNTER — Other Ambulatory Visit: Payer: Self-pay | Admitting: *Deleted

## 2018-04-30 ENCOUNTER — Other Ambulatory Visit: Payer: Medicare Other

## 2018-04-30 DIAGNOSIS — E119 Type 2 diabetes mellitus without complications: Secondary | ICD-10-CM

## 2018-04-30 DIAGNOSIS — E7800 Pure hypercholesterolemia, unspecified: Secondary | ICD-10-CM

## 2018-04-30 DIAGNOSIS — I10 Essential (primary) hypertension: Secondary | ICD-10-CM | POA: Diagnosis not present

## 2018-04-30 DIAGNOSIS — E559 Vitamin D deficiency, unspecified: Secondary | ICD-10-CM

## 2018-05-02 DIAGNOSIS — E119 Type 2 diabetes mellitus without complications: Secondary | ICD-10-CM | POA: Diagnosis not present

## 2018-05-02 LAB — BAYER DCA HB A1C WAIVED: HB A1C (BAYER DCA - WAIVED): 6 % (ref <7.0)

## 2018-05-03 LAB — CBC WITH DIFFERENTIAL/PLATELET
Basophils Absolute: 0.1 10*3/uL (ref 0.0–0.2)
Basos: 1 %
EOS (ABSOLUTE): 0.1 10*3/uL (ref 0.0–0.4)
Eos: 2 %
HEMATOCRIT: 39.1 % (ref 34.0–46.6)
Hemoglobin: 13.7 g/dL (ref 11.1–15.9)
Immature Grans (Abs): 0 10*3/uL (ref 0.0–0.1)
Immature Granulocytes: 0 %
LYMPHS ABS: 1.7 10*3/uL (ref 0.7–3.1)
Lymphs: 30 %
MCH: 32.4 pg (ref 26.6–33.0)
MCHC: 35 g/dL (ref 31.5–35.7)
MCV: 92 fL (ref 79–97)
MONOS ABS: 0.5 10*3/uL (ref 0.1–0.9)
Monocytes: 8 %
Neutrophils Absolute: 3.3 10*3/uL (ref 1.4–7.0)
Neutrophils: 59 %
Platelets: 281 10*3/uL (ref 150–450)
RBC: 4.23 x10E6/uL (ref 3.77–5.28)
RDW: 12.7 % (ref 11.7–15.4)
WBC: 5.6 10*3/uL (ref 3.4–10.8)

## 2018-05-03 LAB — BMP8+EGFR
BUN/Creatinine Ratio: 12 (ref 12–28)
BUN: 8 mg/dL (ref 8–27)
CO2: 21 mmol/L (ref 20–29)
Calcium: 9.4 mg/dL (ref 8.7–10.3)
Chloride: 101 mmol/L (ref 96–106)
Creatinine, Ser: 0.69 mg/dL (ref 0.57–1.00)
GFR calc Af Amer: 92 mL/min/{1.73_m2} (ref >59)
GFR, EST NON AFRICAN AMERICAN: 80 mL/min/{1.73_m2} (ref >59)
Glucose: 118 mg/dL — ABNORMAL HIGH (ref 65–99)
Potassium: 4.7 mmol/L (ref 3.5–5.2)
Sodium: 136 mmol/L (ref 134–144)

## 2018-05-03 LAB — HEPATIC FUNCTION PANEL
ALBUMIN: 4.2 g/dL (ref 3.6–4.6)
ALT: 15 IU/L (ref 0–32)
AST: 18 IU/L (ref 0–40)
Alkaline Phosphatase: 68 IU/L (ref 39–117)
Bilirubin Total: 0.6 mg/dL (ref 0.0–1.2)
Bilirubin, Direct: 0.16 mg/dL (ref 0.00–0.40)
Total Protein: 6.3 g/dL (ref 6.0–8.5)

## 2018-05-03 LAB — LIPID PANEL
Chol/HDL Ratio: 2.4 ratio (ref 0.0–4.4)
Cholesterol, Total: 120 mg/dL (ref 100–199)
HDL: 49 mg/dL (ref >39)
LDL Calculated: 57 mg/dL (ref 0–99)
Triglycerides: 69 mg/dL (ref 0–149)
VLDL Cholesterol Cal: 14 mg/dL (ref 5–40)

## 2018-05-03 LAB — VITAMIN D 25 HYDROXY (VIT D DEFICIENCY, FRACTURES): Vit D, 25-Hydroxy: 48.5 ng/mL (ref 30.0–100.0)

## 2018-05-04 ENCOUNTER — Other Ambulatory Visit: Payer: Self-pay | Admitting: Family Medicine

## 2018-05-05 ENCOUNTER — Ambulatory Visit (INDEPENDENT_AMBULATORY_CARE_PROVIDER_SITE_OTHER): Payer: Medicare Other

## 2018-05-05 ENCOUNTER — Encounter: Payer: Self-pay | Admitting: Family Medicine

## 2018-05-05 ENCOUNTER — Ambulatory Visit: Payer: Medicare Other | Admitting: Family Medicine

## 2018-05-05 VITALS — BP 136/61 | HR 62 | Temp 97.3°F | Ht 62.0 in | Wt 150.0 lb

## 2018-05-05 DIAGNOSIS — E7800 Pure hypercholesterolemia, unspecified: Secondary | ICD-10-CM | POA: Diagnosis not present

## 2018-05-05 DIAGNOSIS — E559 Vitamin D deficiency, unspecified: Secondary | ICD-10-CM

## 2018-05-05 DIAGNOSIS — L232 Allergic contact dermatitis due to cosmetics: Secondary | ICD-10-CM

## 2018-05-05 DIAGNOSIS — M47817 Spondylosis without myelopathy or radiculopathy, lumbosacral region: Secondary | ICD-10-CM

## 2018-05-05 DIAGNOSIS — E1169 Type 2 diabetes mellitus with other specified complication: Secondary | ICD-10-CM

## 2018-05-05 DIAGNOSIS — M25552 Pain in left hip: Secondary | ICD-10-CM

## 2018-05-05 DIAGNOSIS — I10 Essential (primary) hypertension: Secondary | ICD-10-CM

## 2018-05-05 DIAGNOSIS — J449 Chronic obstructive pulmonary disease, unspecified: Secondary | ICD-10-CM | POA: Diagnosis not present

## 2018-05-05 DIAGNOSIS — E119 Type 2 diabetes mellitus without complications: Secondary | ICD-10-CM

## 2018-05-05 DIAGNOSIS — R059 Cough, unspecified: Secondary | ICD-10-CM

## 2018-05-05 DIAGNOSIS — E785 Hyperlipidemia, unspecified: Secondary | ICD-10-CM

## 2018-05-05 DIAGNOSIS — R05 Cough: Secondary | ICD-10-CM

## 2018-05-05 DIAGNOSIS — M47816 Spondylosis without myelopathy or radiculopathy, lumbar region: Secondary | ICD-10-CM | POA: Diagnosis not present

## 2018-05-05 DIAGNOSIS — S79912A Unspecified injury of left hip, initial encounter: Secondary | ICD-10-CM | POA: Diagnosis not present

## 2018-05-05 MED ORDER — METHYLPREDNISOLONE ACETATE 80 MG/ML IJ SUSP
60.0000 mg | Freq: Once | INTRAMUSCULAR | Status: AC
  Administered 2018-05-05: 60 mg via INTRA_ARTICULAR

## 2018-05-05 NOTE — Progress Notes (Signed)
Subjective:    Patient ID: [name] [name], female    DOB: 04-26-1931, 85 y.o.   MRN: [id number]  HPI Pt here for follow up and management of chronic medical problems which includes hypertension, diabetes and hyperlipidemia. She is taking medication regularly.  The patient is doing well overall.  She does complain today of some left leg pain for 3 weeks and some cough and congestion.  She does not need any refills.  She is due to get a chest x-ray and she has had lab work done which we will review with her during the visit today.  She brings in blood sugars for review and all of these are excellent for her as the hemoglobin A1c would indicate.  These readings go back to and include December.  Recent lab work has a normal vitamin D level and she should continue with current treatment at 48.5 hemoglobin 1 A1c was excellent at 6.0.  All liver function tests were normal.  All cholesterol numbers were excellent and at goal and she should continue with current treatment and therapeutic lifestyle changes the CBC was within normal limits with a good hemoglobin and adequate platelet count.  The blood sugar was elevated at 118 the creatinine was normal as well as all of the electrolytes.  Patient has a lengthy history of respiratory issues COPD.  She also has issues with anxiety.  She has diabetes mellitus hypertension and hyperlipidemia.  Her husband has been dead for a few years and her daughters are very supportive of her and she is done an excellent job with staying in control of her health.  She has had a hip replacement in the past.  The patient today is pleasant and alert and says she is been having some left leg pain hip pain for about 3 weeks.  There was no injury associated with this.  Rest helps it and getting up in the morning is worse and it may get a little bit better during the day.  This is the hip that she had replaced several years ago.  She also complains of some congestion and coughing her CBC had a  normal white blood cell count.  I encouraged her to continue with the Mucinex and use her inhalers regularly.  She denies any chest pain pressure tightness or shortness of breath anymore than usual.  She does have occasional trouble with swallowing but this is not getting any worse and denies heartburn indigestion nausea vomiting diarrhea or blood in the stool.  She is passing her water without problems.  We reviewed her blood sugars and I will be scanned into the record.    . Patient Active Problem List   Diagnosis Date Noted  . Type 2 diabetes mellitus with hyperlipidemia ([place]) 09/26/2015  . COPD (chronic obstructive pulmonary disease) ([place]) 05/29/2015  . Dermatographia 05/16/2015  . Type 2 diabetes mellitus, controlled ([place]) 08/30/2014  . Rectal bleeding 03/07/2014  . History of total left hip arthroplasty 01/25/2014  . Elevated liver enzymes 01/13/2014  . Edema 01/02/2014  . Hypoxia 01/02/2014  . S/P total hip arthroplasty 12/27/2013  . Anxiety   . Depressive disorder, not elsewhere classified   . Rhinitis   . Atrophic vaginitis   . Postmenopausal    Outpatient Encounter Medications as of 05/05/2018  Medication Sig  . albuterol (PROVENTIL) (2.5 MG/3ML) 0.083% nebulizer solution NEBULIZE 1 VIAL EVERY 6 HOURS AS NEEDED FOR SHORTNESS OF BREATH OR WHEEZING  . ALPRAZolam (XANAX) 0.25 MG tablet TAKE 1/2  TABLET FOUR TIMES DAILY AS NEEDED  . amLODipine (NORVASC) 2.5 MG tablet TAKE 1 TABLET ONCE A DAY  . aspirin 81 MG tablet Take 81 mg by mouth every other day.  . blood glucose meter kit and supplies KIT Check BS once daily  . budesonide-formoterol (SYMBICORT) 160-4.5 MCG/ACT inhaler Inhale 2 puffs into the lungs 2 (two) times daily.  . cholecalciferol (VITAMIN D) 1000 UNITS tablet Take 2,000 Units by mouth daily.  Marland Kitchen escitalopram (LEXAPRO) 10 MG tablet TAKE 1 TABLET DAILY AS DIRECTED  . fluticasone (FLONASE) 50 MCG/ACT nasal spray USE ONE SPRAY in each nostril ONCE daily  . gabapentin  (NEURONTIN) 100 MG capsule TAKE (1) CAPSULE THREE TIMES DAILY.  Marland Kitchen glucose blood (ONETOUCH VERIO) test strip USE 1 STRIP TO CHECK GLUCOSE TWICE DAILY AS NEEDED  . ketoconazole (NIZORAL) 2 % shampoo Apply 1 application topically 2 (two) times a week.  Elmore Guise Devices (ONE TOUCH DELICA LANCING DEV) MISC Use to check BG up to twice a day. DX: E11.29 - type 2 DM  . lovastatin (MEVACOR) 40 MG tablet Take 1 Tablet by mouth once daily  . meclizine (ANTIVERT) 25 MG tablet Take 1 tablet (25 mg total) by mouth 3 (three) times daily as needed for dizziness.  . metFORMIN (GLUCOPHAGE-XR) 500 MG 24 hr tablet TAKE 1 TABLET ONCE DAILY WITH BREAKFAST  . triamcinolone ointment (KENALOG) 0.5 % Apply topically 2 (two) times daily.   No facility-administered encounter medications on file as of 05/05/2018.       Review of Systems  Constitutional: Negative.   HENT: Positive for congestion.   Eyes: Negative.   Respiratory: Positive for cough.   Cardiovascular: Negative.   Gastrointestinal: Negative.   Endocrine: Negative.   Genitourinary: Negative.   Musculoskeletal: Positive for arthralgias (left leg pain ).  Skin: Negative.   Allergic/Immunologic: Negative.   Neurological: Negative.   Hematological: Negative.   Psychiatric/Behavioral: Negative.        Objective:   Physical Exam Vitals signs and nursing note reviewed.  Constitutional:      Appearance: Normal appearance. She is well-developed. She is obese. She is not ill-appearing.  HENT:     Head: Normocephalic and atraumatic.     Right Ear: Tympanic membrane, ear canal and external ear normal. There is no impacted cerumen.     Left Ear: Tympanic membrane, ear canal and external ear normal. There is no impacted cerumen.     Nose: Nose normal. No congestion or rhinorrhea.     Mouth/Throat:     Mouth: Mucous membranes are moist.     Pharynx: Oropharynx is clear. No oropharyngeal exudate.  Eyes:     General: No scleral icterus.       Right eye:  No discharge.        Left eye: No discharge.     Extraocular Movements: Extraocular movements intact.     Conjunctiva/sclera: Conjunctivae normal.     Pupils: Pupils are equal, round, and reactive to light.     Comments: Follow-up regularly with the ophthalmologist and always make sure that he sends [place] a copy of his exam report.  Patient has return visit scheduled in March.  Neck:     Musculoskeletal: Normal range of motion and neck supple.     Thyroid: No thyromegaly.     Vascular: No carotid bruit or JVD.     Comments: No bruits thyromegaly or anterior cervical adenopathy Cardiovascular:     Rate and Rhythm: Normal rate and regular rhythm.  Pulses: Normal pulses.     Heart sounds: Normal heart sounds. No murmur. No gallop.      Comments: The heart is regular at 60/min with no edema and pedal pulses were present. Pulmonary:     Effort: Pulmonary effort is normal. No respiratory distress.     Breath sounds: Normal breath sounds. No wheezing or rales.     Comments: Patient has a dry cough without wheezes or rales. Abdominal:     General: Abdomen is flat. Bowel sounds are normal.     Palpations: Abdomen is soft. There is no mass.     Tenderness: There is no abdominal tenderness.     Comments: No bruits tenderness masses organ enlargement or inguinal adenopathy  Musculoskeletal: Normal range of motion.        General: Tenderness present.     Left lower leg: No edema.     Comments: Patient uses a 3 pronged cane for walking and is somewhat slow with movement.  She needed assistance with getting on the table and there was some discomfort in the left leg area and left lateral hip area with palpation and leg raising.  Lymphadenopathy:     Cervical: No cervical adenopathy.  Skin:    General: Skin is warm and dry.     Findings: Erythema and rash present.     Comments: Patient has her ongoing rash which today looks pretty good in the neck area.  Neurological:     General: No focal deficit  present.     Mental Status: She is alert and oriented to person, place, and time. Mental status is at baseline.     Cranial Nerves: No cranial nerve deficit.     Motor: No weakness.     Deep Tendon Reflexes: Reflexes are normal and symmetric.  Psychiatric:        Mood and Affect: Mood normal.        Behavior: Behavior normal.        Thought Content: Thought content normal.        Judgment: Judgment normal.     Comments: Mood affect and behavior for this patient are normal.    BP 136/61 (BP Location: Left Arm)   Pulse 62   Temp (!) 97.3 F (36.3 C) (Oral)   Ht '5\' 2"'$  (1.575 m)   Wt 150 lb (68 kg)   BMI 27.44 kg/m    60 of Depo-Medrol injected into trigger point left hip patient tolerated procedure well.     Assessment & Plan:  1. Essential hypertension -The pressure is good today and she will continue with current treatment - DG Chest 2 View; Future  2. Pure hypercholesterolemia -Cholesterol numbers were excellent she will continue with current treatment - DG Chest 2 View; Future  3. Vitamin D deficiency -Continue with vitamin D replacement  4. Type 2 diabetes mellitus without complication, without long-term current use of insulin ([place]) -Continue with current blood sugar management along with therapeutic lifestyle changes  5. Cough -Take Mucinex and use inhalers regularly - DG Chest 2 View; Future  6. Type 2 diabetes mellitus with hyperlipidemia ([facility]) -A1c was excellent she will continue with current treatment  7. Chronic obstructive pulmonary disease, unspecified COPD type ([place]) -Use Mucinex inhalers drink plenty of water and keep the house as cool as possible and use coolmist humidification  8. Allergic contact dermatitis due to cosmetics -Continue alternating cortisone cream and Polysporin  9. Left hip pain -X-ray hip  10. Lumbar and sacral osteoarthritis -  LS spine films  Patient Instructions                       Medicare Annual Wellness Visit  [facility] and the medical providers at [facility] strive to bring you the best medical care.  In doing so we not only want to address your current medical conditions and concerns but also to detect new conditions early and prevent illness, disease and health-related problems.    Medicare offers a yearly Wellness Visit which allows our clinical staff to assess your need for preventative services including immunizations, lifestyle education, counseling to decrease risk of preventable diseases and screening for fall risk and other medical concerns.    This visit is provided free of charge (no copay) for all Medicare recipients. The clinical pharmacists at [facility] have begun to conduct these Wellness Visits which will also include a thorough review of all your medications.    As you primary medical provider recommend that you make an appointment for your Annual Wellness Visit if you have not done so already this year.  You may set up this appointment before you leave today or you may call back ([phone]) and schedule an appointment.  Please make sure when you call that you mention that you are scheduling your Annual Wellness Visit with the clinical pharmacist so that the appointment may be made for the proper length of time.     Continue current medications. Continue good therapeutic lifestyle changes which include good diet and exercise. Fall precautions discussed with patient. If an FOBT was given today- please return it to our front desk. If you are over 63 years old - you may need Prevnar 45 or the adult Pneumonia vaccine.  **Flu shots are available--- please call and schedule a FLU-CLINIC appointment**  After your visit with [place] today you will receive a survey in the mail or online from [employer] regarding your care with [place]. Please take a moment to fill this out. Your feedback is very important to [place] as you can help [place] better understand your patient  needs as well as improve your experience and satisfaction. WE CARE ABOUT YOU!!!   Should continue to drink plenty of fluids and stay well-hydrated. She should practice good hand hygiene and respiratory protection in the midst of the winter season and the flu. She should drink plenty of water and fluids and stay well-hydrated Please remember that we gave you a shot of cortisone today to the left hip area and that this may run your blood sugar for several days and to not be alarmed if it does that just continue to watch her diet closely and those numbers should come back down. If the hip pain does not get better after 2 to 3 weeks she should call us and we will set up a visit with you to see the orthopedic surgeon. Continue with regular use Mucinex and inhalers  Arrie Senate MD

## 2018-05-05 NOTE — Addendum Note (Signed)
Addended by: Zannie Cove on: 05/05/2018 10:00 AM   Modules accepted: Orders

## 2018-05-05 NOTE — Patient Instructions (Addendum)
Medicare Annual Wellness Visit  [facility] and the medical providers at [facility] strive to bring you the best medical care.  In doing so we not only want to address your current medical conditions and concerns but also to detect new conditions early and prevent illness, disease and health-related problems.    Medicare offers a yearly Wellness Visit which allows our clinical staff to assess your need for preventative services including immunizations, lifestyle education, counseling to decrease risk of preventable diseases and screening for fall risk and other medical concerns.    This visit is provided free of charge (no copay) for all Medicare recipients. The clinical pharmacists at [facility] have begun to conduct these Wellness Visits which will also include a thorough review of all your medications.    As you primary medical provider recommend that you make an appointment for your Annual Wellness Visit if you have not done so already this year.  You may set up this appointment before you leave today or you may call back ([phone]) and schedule an appointment.  Please make sure when you call that you mention that you are scheduling your Annual Wellness Visit with the clinical pharmacist so that the appointment may be made for the proper length of time.     Continue current medications. Continue good therapeutic lifestyle changes which include good diet and exercise. Fall precautions discussed with patient. If an FOBT was given today- please return it to our front desk. If you are over 33 years old - you may need Prevnar 5 or the adult Pneumonia vaccine.  **Flu shots are available--- please call and schedule a FLU-CLINIC appointment**  After your visit with [place] today you will receive a survey in the mail or online from [employer] regarding your care with [place]. Please take a moment to fill this out. Your feedback is very  important to [place] as you can help [place] better understand your patient needs as well as improve your experience and satisfaction. WE CARE ABOUT YOU!!!   Should continue to drink plenty of fluids and stay well-hydrated. She should practice good hand hygiene and respiratory protection in the midst of the winter season and the flu. She should drink plenty of water and fluids and stay well-hydrated Please remember that we gave you a shot of cortisone today to the left hip area and that this may run your blood sugar for several days and to not be alarmed if it does that just continue to watch her diet closely and those numbers should come back down. If the hip pain does not get better after 2 to 3 weeks she should call us and we will set up a visit with you to see the orthopedic surgeon. Continue with regular use Mucinex and inhalers

## 2018-05-06 NOTE — Telephone Encounter (Signed)
Pt was seen in office yesterday with PCP, will close encounter.

## 2018-05-09 ENCOUNTER — Telehealth: Payer: Self-pay | Admitting: Family Medicine

## 2018-05-09 NOTE — Telephone Encounter (Signed)
.  wr 

## 2018-05-09 NOTE — Telephone Encounter (Signed)
Patient aware of results.

## 2018-05-23 ENCOUNTER — Other Ambulatory Visit: Payer: Self-pay | Admitting: Family Medicine

## 2018-05-25 DIAGNOSIS — E119 Type 2 diabetes mellitus without complications: Secondary | ICD-10-CM | POA: Diagnosis not present

## 2018-05-26 ENCOUNTER — Telehealth: Payer: Self-pay | Admitting: Family Medicine

## 2018-05-26 MED ORDER — KETOCONAZOLE 2 % EX SHAM: 1.0000 "application " | MEDICATED_SHAMPOO | CUTANEOUS | 0 refills | Status: AC

## 2018-05-26 NOTE — Telephone Encounter (Signed)
Refill sent to pharmacy.   

## 2018-06-29 ENCOUNTER — Other Ambulatory Visit: Payer: Self-pay | Admitting: *Deleted

## 2018-06-29 MED ORDER — LOVASTATIN 40 MG PO TABS: 40.0000 mg | ORAL_TABLET | Freq: Every day | ORAL | 1 refills | Status: DC

## 2018-07-11 ENCOUNTER — Other Ambulatory Visit: Payer: Self-pay | Admitting: Family Medicine

## 2018-07-27 ENCOUNTER — Telehealth: Payer: Self-pay | Admitting: Family Medicine

## 2018-07-27 NOTE — Telephone Encounter (Signed)
Pt aware - to cancel if not urgent at this time.

## 2018-08-01 ENCOUNTER — Other Ambulatory Visit: Payer: Self-pay

## 2018-08-01 ENCOUNTER — Telehealth: Payer: Self-pay | Admitting: Family Medicine

## 2018-08-01 ENCOUNTER — Other Ambulatory Visit: Payer: Medicare Other

## 2018-08-01 ENCOUNTER — Ambulatory Visit (INDEPENDENT_AMBULATORY_CARE_PROVIDER_SITE_OTHER): Payer: Medicare Other | Admitting: Family Medicine

## 2018-08-01 DIAGNOSIS — J449 Chronic obstructive pulmonary disease, unspecified: Secondary | ICD-10-CM

## 2018-08-01 DIAGNOSIS — N390 Urinary tract infection, site not specified: Secondary | ICD-10-CM | POA: Diagnosis not present

## 2018-08-01 DIAGNOSIS — J301 Allergic rhinitis due to pollen: Secondary | ICD-10-CM

## 2018-08-01 DIAGNOSIS — E1169 Type 2 diabetes mellitus with other specified complication: Secondary | ICD-10-CM | POA: Diagnosis not present

## 2018-08-01 DIAGNOSIS — R8290 Unspecified abnormal findings in urine: Secondary | ICD-10-CM | POA: Diagnosis not present

## 2018-08-01 DIAGNOSIS — E785 Hyperlipidemia, unspecified: Secondary | ICD-10-CM

## 2018-08-01 LAB — URINALYSIS
Bilirubin, UA: NEGATIVE
Glucose, UA: NEGATIVE
Ketones, UA: NEGATIVE
Nitrite, UA: POSITIVE — AB
Protein,UA: NEGATIVE
Specific Gravity, UA: 1.02 (ref 1.005–1.030)
Urobilinogen, Ur: 0.2 mg/dL (ref 0.2–1.0)
pH, UA: 7.5 (ref 5.0–7.5)

## 2018-08-01 MED ORDER — CEFDINIR 300 MG PO CAPS: 300.0000 mg | ORAL_CAPSULE | Freq: Two times a day (BID) | ORAL | 0 refills | Status: DC

## 2018-08-01 NOTE — Patient Instructions (Signed)
Have daughters bring a midstream clean-catch urine specimen to the office for checking a urinalysis and getting a culture and sensitivity Keep drinking plenty of water and fluids Continue with nasal saline, Flonase, Mucinex, and regular use of inhalers.

## 2018-08-01 NOTE — Addendum Note (Signed)
Addended by: Zannie Cove on: 08/01/2018 03:56 PM   Modules accepted: Orders

## 2018-08-01 NOTE — Addendum Note (Signed)
Addended by: Zannie Cove on: 08/01/2018 01:13 PM   Modules accepted: Orders

## 2018-08-01 NOTE — Progress Notes (Signed)
Virtual Visit Via telephone Note I connected with@ on 08/01/18 by telephone and verified that I am speaking with the correct person or authorized healthcare agent using two identifiers. [name] [name] is currently located at home and there are no unauthorized people in close proximity. I completed this visit while in a private location in my home .  I connected to the patient by telephone and verified that I was speaking with the correct person.  This visit type was conducted due to national recommendations for restrictions regarding the COVID-19 Pandemic (e.g. social distancing).  This format is felt to be most appropriate for this patient at this time.  All issues noted in this document were discussed and addressed.  No physical exam was performed.    I discussed the limitations, risks, security and privacy concerns of performing an evaluation and management service by telephone and the availability of in person appointments. I also discussed with the patient that there may be a patient responsible charge related to this service. The patient expressed understanding and agreed to proceed.   Date:  08/01/2018    ID:  [name] [name]      November 26, 1931        [id number]   Patient Care Team Patient Care Team: [name] Herb, MD as PCP - General (Family Medicine) [name] Minion, MD (Orthopedic Surgery) [name] Jacks, MD (Ophthalmology)  Reason for Visit: Primary Care Follow-up     History of Present Illness & Review of Systems:     [name] [name] is a 85 y.o. year old female primary care patient that presents today for a telehealth visit.  Patient was alert and said that her blood sugars at home were running well.  She checks them regularly.  She did describe noticing that her urine has a darker color and has an odor that that is normally not the case.  She denied any burning or increased frequency.  She is also had some increased head congestion that is clear in color and she has not had  any fever.  She denies any additional cough.  This is been going on for 2 to 3 days.  As far as the voiding is concerned she has not had any burning or frequency.  She did describe a fall 3 nights ago but says that she is fine as a result of this and she is not sure why she fell she may have gotten her feet tied up in her clothing and this caused her to fall.  She denies any other complaints with her GI tract cardiovascular system or GI symptoms.  Review of systems as stated otherwise negative for body systems left unmentioned.   The patient does not have symptoms concerning for COVID-19 infection (fever, chills, cough, or new shortness of breath).      Current Medications (Verified) Allergies as of 08/01/2018      Reactions   Lisinopril Cough   Chlordiazepoxide-clidinium    Can't remember   Clarithromycin    Can't remember   Erymax [erythromycin]    Can't remember   Penicillins    Can't remember   Betadine [povidone Iodine] Rash   Evista [raloxifene Hydrochloride]    Numb feeling   Hydrogen Peroxide Rash   Sertraline Hcl Other (See Comments)   Sweating and mouth irritation   Sulfa Antibiotics Nausea And Vomiting      Medication List       Accurate as of August 01, 2018  9:59 AM. Always use your most recent med list.        albuterol (2.5 MG/3ML) 0.083% nebulizer solution Commonly known as:  PROVENTIL NEBULIZE 1 VIAL EVERY 6 HOURS AS NEEDED FOR SHORTNESS OF BREATH OR WHEEZING   ALPRAZolam 0.25 MG tablet Commonly known as:  XANAX TAKE 1/2 TABLET FOUR TIMES DAILY AS NEEDED   amLODipine 2.5 MG tablet Commonly known as:  NORVASC TAKE 1 TABLET ONCE A DAY   aspirin 81 MG tablet Take 81 mg by mouth every other day.   blood glucose meter kit and supplies Kit Check BS once daily   budesonide-formoterol 160-4.5 MCG/ACT inhaler Commonly known as:  SYMBICORT Inhale 2 puffs into the lungs 2 (two) times daily.   cholecalciferol 1000 units tablet Commonly known as:  VITAMIN  D Take 2,000 Units by mouth daily.   escitalopram 10 MG tablet Commonly known as:  LEXAPRO TAKE 1 TABLET DAILY AS DIRECTED   fluticasone 50 MCG/ACT nasal spray Commonly known as:  FLONASE USE ONE SPRAY in each nostril ONCE daily   gabapentin 100 MG capsule Commonly known as:  NEURONTIN TAKE (1) CAPSULE THREE TIMES DAILY.   ketoconazole 2 % shampoo Commonly known as:  NIZORAL Apply 1 application topically 2 (two) times a week.   lovastatin 40 MG tablet Commonly known as:  MEVACOR Take 1 tablet (40 mg total) by mouth daily.   meclizine 25 MG tablet Commonly known as:  ANTIVERT Take 1 tablet (25 mg total) by mouth 3 (three) times daily as needed for dizziness.   metFORMIN 500 MG 24 hr tablet Commonly known as:  GLUCOPHAGE-XR TAKE 1 TABLET ONCE DAILY WITH BREAKFAST   ONE TOUCH DELICA LANCING DEV Misc Use to check BG up to twice a day. DX: E11.29 - type 2 DM   OneTouch Verio test strip Generic drug:  glucose blood USE ONE STRIP TO CHECK GLUCOSE TWICE DAILY AS NEEDED   triamcinolone ointment 0.5 % Commonly known as:  KENALOG Apply topically 2 (two) times daily.           Allergies (Verified)    Lisinopril; Chlordiazepoxide-clidinium; Clarithromycin; Erymax [erythromycin]; Penicillins; Betadine [povidone iodine]; Evista [raloxifene hydrochloride]; Hydrogen peroxide; Sertraline hcl; and Sulfa antibiotics  Past Medical History Past Medical History:  Diagnosis Date   Anxiety    Arthritis    Atrophic vaginitis    Cataract    COPD (chronic obstructive pulmonary disease) (HCC)    Depressive disorder, not elsewhere classified    Diabetes mellitus without complication (HCC)    Disorder of bone and cartilage, unspecified    Elevated blood sugar    Emphysema lung ([place])    Hypertension    Other and unspecified hyperlipidemia    Postmenopausal    Rhinitis      Past Surgical History:  Procedure Laterality Date   APPENDECTOMY  1957   CATARACT  EXTRACTION W/ INTRAOCULAR LENS IMPLANT Bilateral 2005   [facility]   COLONOSCOPY N/A 03/08/2014   Procedure: COLONOSCOPY;  Surgeon: Rogene Houston, MD;  [facility];  Service: Endoscopy;  Laterality: N/A;   EYE SURGERY     JOINT REPLACEMENT     TOTAL HIP ARTHROPLASTY Left 12/27/2013   Procedure: LEFT TOTAL HIP ARTHROPLASTY;  Surgeon: [name] Minion, MD;  Location: [facility];  Service: Orthopedics;  Laterality: Left;    Social History   Socioeconomic History   Marital status: Married    Spouse name: Not on file   Number of children: 2  Years of education: 10   Highest education level: 10th grade  Occupational History   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 [employer] insecurity:    Worry: Never true    Inability: Never true   Transportation needs:    Medical: No    Non-medical: No  Tobacco Use   Smoking status: Never Smoker   Smokeless tobacco: Never Used  Substance and Sexual Activity   Alcohol use: No    Alcohol/week: 0.0 standard drinks   Drug use: No   Sexual activity: Not Currently  Lifestyle   Physical activity:    Days per week: 5 days    Minutes per session: 20 min   Stress: Only a little  Relationships   Social connections:    Talks on phone: More than three times a week    Gets together: More than three times a week    Attends religious service: More than 4 times per year    Active member of club or organization: Yes    Attends meetings of clubs or organizations: More than 4 times per year    Relationship status: Widowed  Other Topics Concern   Not on file  Social History Narrative   Not on file     Family History  Problem Relation Age of Onset   Arthritis Mother    COPD Father    Diabetes Father    Aneurysm Sister    Diabetes Sister    Diabetes Sister    Hypertension Sister    COPD Daughter    Fibromyalgia Daughter    Healthy  Daughter       Labs/Other Tests and Data Reviewed:    Wt Readings from Last 3 Encounters:  05/05/18 150 lb (68 kg)  03/21/18 149 lb 12.8 oz (67.9 kg)  02/15/18 150 lb (68 kg)   Temp Readings from Last 3 Encounters:  05/05/18 (!) 97.3 F (36.3 C) (Oral)  03/21/18 (!) 97.2 F (36.2 C) (Oral)  02/15/18 98 F (36.7 C) (Oral)   BP Readings from Last 3 Encounters:  05/05/18 136/61  03/21/18 114/63  02/15/18 115/64   Pulse Readings from Last 3 Encounters:  05/05/18 62  03/21/18 67  02/15/18 72     Lab Results  Component Value Date   HGBA1C 6.0 05/02/2018   HGBA1C 6.3 12/06/2017   HGBA1C 6.5 07/12/2017   Lab Results  Component Value Date   LDLCALC 57 04/30/2018   CREATININE 0.69 04/30/2018       Chemistry      Component Value Date/Time   NA 136 04/30/2018 1000   K 4.7 04/30/2018 1000   CL 101 04/30/2018 1000   CO2 21 04/30/2018 1000   BUN 8 04/30/2018 1000   CREATININE 0.69 04/30/2018 1000   CREATININE 0.88 12/26/2012 1634      Component Value Date/Time   CALCIUM 9.4 04/30/2018 1000   ALKPHOS 68 04/30/2018 1000   AST 18 04/30/2018 1000   ALT 15 04/30/2018 1000   BILITOT 0.6 04/30/2018 1000         OBSERVATIONS/ OBJECTIVE:     The patient was alert and responded appropriately to questions asked of her.  She lives by herself.  She has been staying in because of the COVID-19 pandemic.  Her daughters are supportive of her and make sure that she is taking well care of.  The patient has otherwise been  feeling well.  She understands that we will need to check a urine specimen in the office and this should be a clean-catch midstream specimen.  Physical exam deferred due to nature of telephonic visit.  ASSESSMENT & PLAN    Time:   Today, I have spent 25 minutes with the patient via telephone discussing the above including Covid precautions.     Visit Diagnoses: 1. Chronic obstructive pulmonary disease, unspecified COPD type ([place]) -Continue with regular  inhalers and Mucinex and drinking plenty of fluids  2. Type 2 diabetes mellitus with hyperlipidemia (HCC) -Blood sugars have been running in the 92-1 06 range and this is good for her and she will continue with current treatment  3. Recurrent UTI -The patient has had a malodorous urine and a darker urine than usual but no burning pain or frequency. -Check urine and get culture and sensitivity  4.  Seasonal allergic rhinitis due to pollen -Continue with Flonase Mucinex and regular respiratory inhalers -Use nasal saline frequently -Drink plenty of water  5.  Urine malodor -Continue to drink plenty of water and stay well-hydrated -Check urinalysis and get urine culture and sensitivity  Patient Instructions  Have daughters bring a midstream clean-catch urine specimen to the office for checking a urinalysis and getting a culture and sensitivity Keep drinking plenty of water and fluids Continue with nasal saline, Flonase, Mucinex, and regular use of inhalers.        The above assessment and management plan was discussed with the patient. The patient verbalized understanding of and has agreed to the management plan. Patient is aware to call the clinic if symptoms persist or worsen. Patient is aware when to return to the clinic for a follow-up visit. Patient educated on when it is appropriate to go to the emergency department.    [name] Herb, MD [address], [place], [place] [address] Ph [phone]   Arrie Senate MD

## 2018-08-03 ENCOUNTER — Ambulatory Visit: Payer: Medicare Other | Admitting: Family Medicine

## 2018-08-03 LAB — URINE CULTURE

## 2018-08-15 ENCOUNTER — Other Ambulatory Visit: Payer: Self-pay | Admitting: Family Medicine

## 2018-08-22 ENCOUNTER — Other Ambulatory Visit: Payer: Self-pay | Admitting: *Deleted

## 2018-08-22 ENCOUNTER — Other Ambulatory Visit: Payer: Medicare Other

## 2018-08-22 ENCOUNTER — Other Ambulatory Visit: Payer: Self-pay

## 2018-08-22 DIAGNOSIS — R300 Dysuria: Secondary | ICD-10-CM | POA: Diagnosis not present

## 2018-08-22 LAB — URINALYSIS, COMPLETE
Bilirubin, UA: NEGATIVE
Glucose, UA: NEGATIVE
Ketones, UA: NEGATIVE
Nitrite, UA: NEGATIVE
Protein,UA: NEGATIVE
RBC, UA: NEGATIVE
Specific Gravity, UA: 1.02 (ref 1.005–1.030)
Urobilinogen, Ur: 0.2 mg/dL (ref 0.2–1.0)
pH, UA: 6.5 (ref 5.0–7.5)

## 2018-08-22 LAB — MICROSCOPIC EXAMINATION: Renal Epithel, UA: NONE SEEN /hpf

## 2018-08-22 MED ORDER — CEFDINIR 300 MG PO CAPS: 300.0000 mg | ORAL_CAPSULE | Freq: Two times a day (BID) | ORAL | 0 refills | Status: DC

## 2018-08-23 LAB — URINE CULTURE

## 2018-09-14 ENCOUNTER — Other Ambulatory Visit: Payer: Self-pay

## 2018-09-14 ENCOUNTER — Telehealth: Payer: Self-pay | Admitting: Family Medicine

## 2018-09-14 ENCOUNTER — Ambulatory Visit (INDEPENDENT_AMBULATORY_CARE_PROVIDER_SITE_OTHER): Payer: Medicare Other | Admitting: Family Medicine

## 2018-09-14 ENCOUNTER — Encounter: Payer: Self-pay | Admitting: Family Medicine

## 2018-09-14 DIAGNOSIS — E559 Vitamin D deficiency, unspecified: Secondary | ICD-10-CM | POA: Diagnosis not present

## 2018-09-14 DIAGNOSIS — J301 Allergic rhinitis due to pollen: Secondary | ICD-10-CM | POA: Diagnosis not present

## 2018-09-14 DIAGNOSIS — I10 Essential (primary) hypertension: Secondary | ICD-10-CM | POA: Diagnosis not present

## 2018-09-14 DIAGNOSIS — E782 Mixed hyperlipidemia: Secondary | ICD-10-CM

## 2018-09-14 DIAGNOSIS — J449 Chronic obstructive pulmonary disease, unspecified: Secondary | ICD-10-CM | POA: Diagnosis not present

## 2018-09-14 DIAGNOSIS — E119 Type 2 diabetes mellitus without complications: Secondary | ICD-10-CM

## 2018-09-14 DIAGNOSIS — F419 Anxiety disorder, unspecified: Secondary | ICD-10-CM

## 2018-09-14 DIAGNOSIS — F418 Other specified anxiety disorders: Secondary | ICD-10-CM

## 2018-09-14 MED ORDER — ESCITALOPRAM OXALATE 10 MG PO TABS: 10.0000 mg | ORAL_TABLET | Freq: Every day | ORAL | 3 refills | Status: DC

## 2018-09-14 MED ORDER — ALPRAZOLAM 0.25 MG PO TABS: ORAL_TABLET | ORAL | 5 refills | Status: DC

## 2018-09-14 MED ORDER — PREDNISONE 10 MG PO TABS: ORAL_TABLET | ORAL | 0 refills | Status: DC

## 2018-09-14 NOTE — Addendum Note (Signed)
Addended by: Chipper Herb on: 09/14/2018 04:32 PM   Modules accepted: Orders

## 2018-09-14 NOTE — Progress Notes (Signed)
Virtual Visit Via telephone Note I connected with@ on 09/14/18 by telephone and verified that I am speaking with the correct person or authorized healthcare agent using two identifiers. [name] [name] is currently located at home and there are no unauthorized people in close proximity. I completed this visit while in a private location in my home.  This visit type was conducted due to national recommendations for restrictions regarding the COVID-19 Pandemic (e.g. social distancing).  This format is felt to be most appropriate for this patient at this time.  All issues noted in this document were discussed and addressed.  No physical exam was performed.    I discussed the limitations, risks, security and privacy concerns of performing an evaluation and management service by telephone and the availability of in person appointments. I also discussed with the patient that there may be a patient responsible charge related to this service. The patient expressed understanding and agreed to proceed.   Date:  09/14/2018    ID:  [name] [name]      [birth year]        [id number]   Patient Care Team Patient Care Team: Chipper Herb, MD as PCP - General (Family Medicine) Newt Minion, MD (Orthopedic Surgery) Clent Jacks, MD (Ophthalmology)  Reason for Visit: Primary Care Follow-up     History of Present Illness & Review of Systems:     MAHAILA [name] is a 85 y.o. year old female primary care patient that presents today for a telehealth visit.  The patient is pleasant and alert and a widow.  She is done extremely well with managing her health conditions being at home with the assistance of her 2 daughters who keep a close check on her.  Today she denies any chest pain pressure or tightness.  She has had a flare and allergies as far as head congestion and breathing issues.  She has not been running any fever that she is aware of.  She is having no trouble with swallowing heartburn indigestion  nausea vomiting diarrhea or blood in the stool.  She is passing her water well.  Her blood sugars at home have been running generally between 80 and 115 she did have one that was as high as 137.  Overall her blood sugars have been under good control.  She is using her inhalers regularly.  She sees eye doctor in July.  She is requesting refills on her Xanax and Lexapro.  Review of systems as stated, otherwise negative.  The patient does not have symptoms concerning for COVID-19 infection (fever, chills, cough, or new shortness of breath).      Current Medications (Verified) Allergies as of 09/14/2018      Reactions   Lisinopril Cough   Chlordiazepoxide-clidinium    Can't remember   Clarithromycin    Can't remember   Erymax [erythromycin]    Can't remember   Penicillins    Can't remember   Betadine [povidone Iodine] Rash   Evista [raloxifene Hydrochloride]    Numb feeling   Hydrogen Peroxide Rash   Sertraline Hcl Other (See Comments)   Sweating and mouth irritation   Sulfa Antibiotics Nausea And Vomiting      Medication List       Accurate as of September 14, 2018 10:39 AM. If you have any questions, ask your nurse or doctor.        albuterol (2.5 MG/3ML) 0.083% nebulizer solution Commonly known as:  PROVENTIL NEBULIZE 1  VIAL EVERY 6 HOURS AS NEEDED FOR SHORTNESS OF BREATH OR WHEEZING   ALPRAZolam 0.25 MG tablet Commonly known as:  XANAX TAKE 1/2 TABLET FOUR TIMES DAILY AS NEEDED   amLODipine 2.5 MG tablet Commonly known as:  NORVASC TAKE 1 TABLET ONCE A DAY   aspirin 81 MG tablet Take 81 mg by mouth every other day.   blood glucose meter kit and supplies Kit Check BS once daily   budesonide-formoterol 160-4.5 MCG/ACT inhaler Commonly known as:  SYMBICORT Inhale 2 puffs into the lungs 2 (two) times daily.   cefdinir 300 MG capsule Commonly known as:  OMNICEF Take 1 capsule (300 mg total) by mouth 2 (two) times daily. 1 po BID   cefdinir 300 MG capsule Commonly  known as:  OMNICEF Take 1 capsule (300 mg total) by mouth 2 (two) times daily. 1 po BID   cholecalciferol 1000 units tablet Commonly known as:  VITAMIN D Take 2,000 Units by mouth daily.   escitalopram 10 MG tablet Commonly known as:  LEXAPRO TAKE 1 TABLET DAILY AS DIRECTED   fluticasone 50 MCG/ACT nasal spray Commonly known as:  FLONASE USE ONE SPRAY in each nostril ONCE daily   gabapentin 100 MG capsule Commonly known as:  NEURONTIN TAKE (1) CAPSULE THREE TIMES DAILY.   ketoconazole 2 % shampoo Commonly known as:  NIZORAL Apply 1 application topically 2 (two) times a week.   lovastatin 40 MG tablet Commonly known as:  MEVACOR Take 1 tablet (40 mg total) by mouth daily.   meclizine 25 MG tablet Commonly known as:  ANTIVERT Take 1 tablet (25 mg total) by mouth 3 (three) times daily as needed for dizziness.   metFORMIN 500 MG 24 hr tablet Commonly known as:  GLUCOPHAGE-XR TAKE 1 TABLET ONCE DAILY WITH BREAKFAST   ONE TOUCH DELICA LANCING DEV Misc Use to check BG up to twice a day. DX: E11.29 - type 2 DM   OneTouch Verio test strip Generic drug:  glucose blood USE ONE STRIP TO CHECK GLUCOSE TWICE DAILY AS NEEDED   triamcinolone ointment 0.5 % Commonly known as:  KENALOG Apply topically 2 (two) times daily.           Allergies (Verified)    Lisinopril; Chlordiazepoxide-clidinium; Clarithromycin; Erymax [erythromycin]; Penicillins; Betadine [povidone iodine]; Evista [raloxifene hydrochloride]; Hydrogen peroxide; Sertraline hcl; and Sulfa antibiotics  Past Medical History Past Medical History:  Diagnosis Date  . Anxiety   . Arthritis   . Atrophic vaginitis   . Cataract   . COPD (chronic obstructive pulmonary disease) ([place])   . Depressive disorder, not elsewhere classified   . Diabetes mellitus without complication ([place])   . Disorder of bone and cartilage, unspecified   . Elevated blood sugar   . Emphysema lung ([place])   . Hypertension   . Other and  unspecified hyperlipidemia   . Postmenopausal   . Rhinitis      Past Surgical History:  Procedure Laterality Date  . APPENDECTOMY  1957  . CATARACT EXTRACTION W/ INTRAOCULAR LENS IMPLANT Bilateral 2005   [facility]  . COLONOSCOPY N/A 03/08/2014   Procedure: COLONOSCOPY;  Surgeon: Rogene Houston, MD;  [facility];  Service: Endoscopy;  Laterality: N/A;  . EYE SURGERY    . JOINT REPLACEMENT    . TOTAL HIP ARTHROPLASTY Left 12/27/2013   Procedure: LEFT TOTAL HIP ARTHROPLASTY;  Surgeon: Newt Minion, MD;  Location: [facility];  Service: Orthopedics;  Laterality: Left;    Social History  Socioeconomic History  . Marital status: Married    Spouse name: Not on file  . Number of children: 2  . Years of education: 10  . Highest education level: 10th grade  Occupational History  . 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 Financial resource strain: Not hard at all  . Food insecurity:    Worry: Never true    Inability: Never true  . Transportation needs:    Medical: No    Non-medical: No  Tobacco Use  . Smoking status: Never Smoker  . Smokeless tobacco: Never Used  Substance and Sexual Activity  . Alcohol use: No    Alcohol/week: 0.0 standard drinks  . Drug use: No  . Sexual activity: Not Currently  Lifestyle  . Physical activity:    Days per week: 5 days    Minutes per session: 20 min  . Stress: Only a little  Relationships  . Social connections:    Talks on phone: More than three times a week    Gets together: More than three times a week    Attends religious service: More than 4 times per year    Active member of club or organization: Yes    Attends meetings of clubs or organizations: More than 4 times per year    Relationship status: Widowed  Other Topics Concern  . Not on file  Social History Narrative  . Not on file     Family History  Problem Relation Age of Onset  . Arthritis Mother   . COPD Father   . Diabetes  Father   . Aneurysm Sister   . Diabetes Sister   . Diabetes Sister   . Hypertension Sister   . COPD Daughter   . Fibromyalgia Daughter   . Healthy Daughter       Labs/Other Tests and Data Reviewed:    Wt Readings from Last 3 Encounters:  05/05/18 150 lb (68 kg)  03/21/18 149 lb 12.8 oz (67.9 kg)  02/15/18 150 lb (68 kg)   Temp Readings from Last 3 Encounters:  05/05/18 (!) 97.3 F (36.3 C) (Oral)  03/21/18 (!) 97.2 F (36.2 C) (Oral)  02/15/18 98 F (36.7 C) (Oral)   BP Readings from Last 3 Encounters:  05/05/18 136/61  03/21/18 114/63  02/15/18 115/64   Pulse Readings from Last 3 Encounters:  05/05/18 62  03/21/18 67  02/15/18 72     Lab Results  Component Value Date   HGBA1C 6.0 05/02/2018   HGBA1C 6.3 12/06/2017   HGBA1C 6.5 07/12/2017   Lab Results  Component Value Date   LDLCALC 57 04/30/2018   CREATININE 0.69 04/30/2018       Chemistry      Component Value Date/Time   NA 136 04/30/2018 1000   K 4.7 04/30/2018 1000   CL 101 04/30/2018 1000   CO2 21 04/30/2018 1000   BUN 8 04/30/2018 1000   CREATININE 0.69 04/30/2018 1000   CREATININE 0.88 12/26/2012 1634      Component Value Date/Time   CALCIUM 9.4 04/30/2018 1000   ALKPHOS 68 04/30/2018 1000   AST 18 04/30/2018 1000   ALT 15 04/30/2018 1000   BILITOT 0.6 04/30/2018 1000         OBSERVATIONS/ OBJECTIVE:     The patient was alert and related all of her health conditions well to me and seems to have a everything under good control with the help of her  daughters at home.  She denies any sores or infections on her feet.  Her blood sugars as mentioned already are generally running between 80 and 115 and she was told that if she takes prednisone they may run up higher for couple weeks and she should even do better with her diet during that time.  She does not have a scales to report a weight.  She does not have a blood pressure monitor for any blood pressure readings.  He has been more short  winded with the chest congestion.  She understands to use her inhalers more regularly and take her Mucinex more regularly.  Physical exam deferred due to nature of telephonic visit.  ASSESSMENT & PLAN    Time:   Today, I have spent 22 minutes with the patient via telephone discussing the above including Covid precautions.     Visit Diagnoses: 1. Chronic obstructive pulmonary disease, unspecified COPD type ([place]) -Patient is currently having an allergy and COPD exacerbation.  She does not sound that dyspneic over the phone we will go ahead and do a course of prednisone and she is going to come in tomorrow to get blood work and give her a shot of Depo-Medrol at that time.  She understands to watch her blood sugar more closely for the next couple weeks because of this.  If she starts running any fever she should get back in touch with [place].  2. Seasonal allergic rhinitis due to pollen -Continue with Flonase Symbicort and albuterol nebulizer as directed -Continue with Mucinex and drink plenty of water and fluids -Depo-Medrol IM when patient comes in to get blood work and have nurse check blood pressure  3. Vitamin D deficiency -Continue with vitamin D replacement pending results of lab work  4. Essential hypertension -Pressure by nurse and watch sodium intake  5. Type 2 diabetes mellitus without complication, without long-term current use of insulin ([place]) -Continue with current treatment pending results of lab work and hemoglobin A1c  6. Mixed hyperlipidemia -Continue with statin therapy at 40 mg daily of lovastatin  7. Depression with anxiety -Refill Xanax and Lexapro and continue with these medicines  8.  COPD exacerbation -Prednisone taper over 8 days and 60 of Depo-Medrol IM when patient comes in to get lab work  Patient Instructions  Drink plenty of water and fluids Keep blood sugar under the best control possible with diet and exercise Continue to be careful and not put self at  risk for falling Continue to practice good hand and respiratory hygiene Use inhalers regularly and continue with Mucinex  If any fever develops please call us back Stay in for the next 10 to 14 days Come and get lab work and have nurse check blood pressure We will refill Lexapro and Xanax and call in a prescription for prednisone taper and when you come to the office for the blood work wear protective equipment and we will give a shot of Depo-Medrol at that time.     The above assessment and management plan was discussed with the patient. The patient verbalized understanding of and has agreed to the management plan. Patient is aware to call the clinic if symptoms persist or worsen. Patient is aware when to return to the clinic for a follow-up visit. Patient educated on when it is appropriate to go to the emergency department.    Chipper Herb, MD [address], [place], [place] [address] Ph [phone]   Arrie Senate MD

## 2018-09-14 NOTE — Addendum Note (Signed)
Addended by: Zannie Cove on: 09/14/2018 04:13 PM   Modules accepted: Orders

## 2018-09-14 NOTE — Patient Instructions (Signed)
Drink plenty of water and fluids Keep blood sugar under the best control possible with diet and exercise Continue to be careful and not put self at risk for falling Continue to practice good hand and respiratory hygiene Use inhalers regularly and continue with Mucinex  If any fever develops please call us back Stay in for the next 10 to 14 days Come and get lab work and have nurse check blood pressure We will refill Lexapro and Xanax and call in a prescription for prednisone taper and when you come to the office for the blood work wear protective equipment and we will give a shot of Depo-Medrol at that time.

## 2018-09-14 NOTE — Addendum Note (Signed)
Addended by: Zannie Cove on: 09/14/2018 04:15 PM   Modules accepted: Orders

## 2018-09-16 ENCOUNTER — Other Ambulatory Visit: Payer: Self-pay | Admitting: Family Medicine

## 2018-09-16 ENCOUNTER — Telehealth: Payer: Self-pay | Admitting: Family Medicine

## 2018-09-16 NOTE — Telephone Encounter (Signed)
FYI      Patient is taking prednisone and it causes her blood sugar to go up.  It was at 77 yesterday so she called EMS.   She had been drinking a lot of water and checked her sugars excessively due to worrying.   EMS got a reading of 150 at seven pm when they arrived.  They reassured her and did not do anything else.   Patient was advised on different foods to avoid and to eat to help with blood sugar.   She was comforted and told the provider would be made aware.  Advised to call back if needed.

## 2018-09-19 ENCOUNTER — Other Ambulatory Visit: Payer: Self-pay | Admitting: *Deleted

## 2018-09-19 MED ORDER — ALBUTEROL SULFATE (2.5 MG/3ML) 0.083% IN NEBU: INHALATION_SOLUTION | RESPIRATORY_TRACT | 0 refills | Status: DC

## 2018-09-20 ENCOUNTER — Telehealth: Payer: Self-pay | Admitting: Family Medicine

## 2018-09-20 NOTE — Telephone Encounter (Signed)
It is protocol. It is better to be safe for her and everyone else!

## 2018-09-20 NOTE — Telephone Encounter (Signed)
Patient states that she finishes her prednisone on Thursday. She is very upset that she can not come in and get her labs. She was very adament that she speak with Dr. Laurance Flatten.

## 2018-09-21 NOTE — Telephone Encounter (Signed)
Please call patient and/or daughter if necessary and re-explained to her why she was not allowed to come in early because she had been sick and coughing.  She should be okay in another week.  Also getting blood work done when she is on prednisone would run up white blood cell count.  Please give her time to come in early next week to get routine lab work done.

## 2018-09-21 NOTE — Telephone Encounter (Signed)
Spoke with patient and she verbalizes understanding.

## 2018-09-22 ENCOUNTER — Telehealth: Payer: Self-pay | Admitting: Family Medicine

## 2018-09-22 NOTE — Telephone Encounter (Signed)
Spoke with patient. She just wanted to know if Dr. Laurance Flatten was upset with her. Patient aware Dr. Laurance Flatten is not mad or upset with her.

## 2018-09-22 NOTE — Telephone Encounter (Signed)
Discussed with [employer] today

## 2018-09-28 ENCOUNTER — Telehealth: Payer: Self-pay | Admitting: Family Medicine

## 2018-09-28 ENCOUNTER — Other Ambulatory Visit: Payer: Self-pay

## 2018-09-28 NOTE — Telephone Encounter (Signed)
Pt has finished Prednisone No current sxs Ok to come in for visit

## 2018-09-29 ENCOUNTER — Ambulatory Visit (INDEPENDENT_AMBULATORY_CARE_PROVIDER_SITE_OTHER): Payer: Medicare Other | Admitting: Family Medicine

## 2018-09-29 ENCOUNTER — Encounter: Payer: Self-pay | Admitting: Family Medicine

## 2018-09-29 ENCOUNTER — Other Ambulatory Visit: Payer: Self-pay

## 2018-09-29 VITALS — BP 147/64 | HR 76 | Temp 98.0°F | Ht 62.0 in | Wt 147.4 lb

## 2018-09-29 DIAGNOSIS — J410 Simple chronic bronchitis: Secondary | ICD-10-CM

## 2018-09-29 DIAGNOSIS — R42 Dizziness and giddiness: Secondary | ICD-10-CM

## 2018-09-29 DIAGNOSIS — E118 Type 2 diabetes mellitus with unspecified complications: Secondary | ICD-10-CM | POA: Diagnosis not present

## 2018-09-29 MED ORDER — PREDNISONE 20 MG PO TABS: ORAL_TABLET | ORAL | 0 refills | Status: DC

## 2018-09-29 NOTE — Progress Notes (Signed)
BP (!) 147/64   Pulse 76   Temp 98 F (36.7 C) (Oral)   Ht _0  (1.575 m)   Wt 147 lb 6.4 oz (66.9 kg)   BMI 26.96 kg/m    Subjective:   Patient ID: [name] [name], female    DOB: 1931-08-13, 85 y.o.   MRN: [id number]  HPI: [name] [name] is a 85 y.o. female presenting on 09/29/2018 for Dizziness (Patient states it has been on and off but worse this morning. States she has been off blanace.), Establish Care ([facility] pt- Patient would like labs done.), and Allergies   HPI Dizziness and lightheadedness related to sinuses, she gets allergies frequently for this.  She has these chronically and quite frequently and they would last for couple weeks and then go away.  She has not had any falls.  She describes it as a lightheadedness.  COPD Patient is coming in for COPD recheck today.  He is currently on Symbicort and Flonase and albuterol.  He has a mild chronic cough but denies any major coughing spells or wheezing spells.  He has 1nighttime symptoms per week and 2daytime symptoms per week currently.   Type 2 diabetes mellitus Patient comes in today for recheck of his diabetes. Patient has been currently taking metformin. Patient is not currently on an ACE inhibitor/ARB. Patient has not seen an ophthalmologist this year. Patient denies any issues with their feet.   Relevant past medical, surgical, family and social history reviewed and updated as indicated. Interim medical history since our last visit reviewed. Allergies and medications reviewed and updated.  Review of Systems  Constitutional: Negative for chills and fever.  HENT: Positive for sinus pressure. Negative for sinus pain.   Eyes: Negative for redness and visual disturbance.  Respiratory: Negative for chest tightness and shortness of breath.   Cardiovascular: Negative for chest pain and leg swelling.  Musculoskeletal: Negative for back pain and gait problem.  Skin: Negative for rash.  Neurological: Positive for dizziness and  light-headedness. Negative for headaches.  Psychiatric/Behavioral: Negative for agitation and behavioral problems.  All other systems reviewed and are negative.   Per HPI unless specifically indicated above   Allergies as of 09/29/2018      Reactions   Lisinopril Cough   Chlordiazepoxide-clidinium    Can't remember   Clarithromycin    Can't remember   Erymax [erythromycin]    Can't remember   Penicillins    Can't remember   Betadine [povidone Iodine] Rash   Evista [raloxifene Hydrochloride]    Numb feeling   Hydrogen Peroxide Rash   Sertraline Hcl Other (See Comments)   Sweating and mouth irritation   Sulfa Antibiotics Nausea And Vomiting      Medication List       Accurate as of September 29, 2018 11:54 AM. If you have any questions, ask your nurse or doctor.        STOP taking these medications   meclizine 25 MG tablet Commonly known as: ANTIVERT Stopped by: Fransisca Kaufmann Karin Pinedo, MD   predniSONE 10 MG tablet Commonly known as: DELTASONE Stopped by: Fransisca Kaufmann Jocabed Cheese, MD     TAKE these medications   albuterol (2.5 MG/3ML) 0.083% nebulizer solution Commonly known as: PROVENTIL NEBULIZE 1 VIAL EVERY 6 HOURS AS NEEDED FOR SHORTNESS OF BREATH OR WHEEZING   albuterol (2.5 MG/3ML) 0.083% nebulizer solution Commonly known as: PROVENTIL NEBULIZE 1 VIAL EVERY 6 HOURS AS NEEDED FOR SHORTNESS OF BREATH OR WHEEZING   ALPRAZolam 0.25  MG tablet Commonly known as: XANAX TAKE 1/2 TABLET FOUR TIMES DAILY AS NEEDED   amLODipine 2.5 MG tablet Commonly known as: NORVASC TAKE 1 TABLET ONCE A DAY   aspirin 81 MG tablet Take 81 mg by mouth every other day.   blood glucose meter kit and supplies Kit Check BS once daily   budesonide-formoterol 160-4.5 MCG/ACT inhaler Commonly known as: SYMBICORT Inhale 2 puffs into the lungs 2 (two) times daily.   cholecalciferol 1000 units tablet Commonly known as: VITAMIN D Take 2,000 Units by mouth daily.   escitalopram 10 MG tablet  Commonly known as: LEXAPRO Take 1 tablet (10 mg total) by mouth daily. as directed   fluticasone 50 MCG/ACT nasal spray Commonly known as: FLONASE USE ONE SPRAY in each nostril ONCE daily   gabapentin 100 MG capsule Commonly known as: NEURONTIN TAKE (1) CAPSULE THREE TIMES DAILY.   ketoconazole 2 % shampoo Commonly known as: NIZORAL Apply 1 application topically 2 (two) times a week.   lovastatin 40 MG tablet Commonly known as: MEVACOR Take 1 tablet (40 mg total) by mouth daily.   metFORMIN 500 MG 24 hr tablet Commonly known as: GLUCOPHAGE-XR TAKE 1 TABLET ONCE DAILY WITH BREAKFAST   ONE TOUCH DELICA LANCING DEV Misc Use to check BG up to twice a day. DX: E11.29 - type 2 DM   OneTouch Verio test strip Generic drug: glucose blood USE ONE STRIP TO CHECK GLUCOSE TWICE DAILY AS NEEDED   triamcinolone ointment 0.5 % Commonly known as: KENALOG Apply topically 2 (two) times daily.        Objective:   BP (!) 147/64   Pulse 76   Temp 98 F (36.7 C) (Oral)   Ht _0  (1.575 m)   Wt 147 lb 6.4 oz (66.9 kg)   BMI 26.96 kg/m   Wt Readings from Last 3 Encounters:  09/29/18 147 lb 6.4 oz (66.9 kg)  05/05/18 150 lb (68 kg)  03/21/18 149 lb 12.8 oz (67.9 kg)    Physical Exam Vitals signs and nursing note reviewed.  Constitutional:      General: She is not in acute distress.    Appearance: She is well-developed. She is not diaphoretic.  Eyes:     Conjunctiva/sclera: Conjunctivae normal.     Pupils: Pupils are equal, round, and reactive to light.  Cardiovascular:     Rate and Rhythm: Normal rate and regular rhythm.     Heart sounds: Normal heart sounds. No murmur.  Pulmonary:     Effort: Pulmonary effort is normal. No respiratory distress.     Breath sounds: Normal breath sounds. No wheezing.  Musculoskeletal: Normal range of motion.        General: No tenderness.  Skin:    General: Skin is warm and dry.     Findings: No rash.  Neurological:     Mental Status:  She is alert and oriented to person, place, and time.     Coordination: Coordination normal.  Psychiatric:        Behavior: Behavior normal.       Assessment & Plan:   Problem List Items Addressed This Visit      Respiratory   COPD (chronic obstructive pulmonary disease) ([place])   Relevant Medications   predniSONE (DELTASONE) 20 MG tablet     Endocrine   Type 2 diabetes mellitus, controlled ([place]) - Primary   Relevant Orders   CMP14+EGFR (Completed)   Lipid panel (Completed)    Other Visit Diagnoses  Dizziness          Dizziness is likely related to off-and-on chronic sinus issues and allergies, recommended treating it like such and starting to use her Flonase.  She does not have any other red flags that would make the dizziness seem like anything else except that, do not think it is COVID based on symptoms.  Will give short course of prednisone to help with symptoms.  Continue metformin and monitor closely while on the prednisone, also continue amlodipine for hypertension and lovastatin for cholesterol Follow up plan: Return in about 3 months (around 12/30/2018), or if symptoms worsen or fail to improve, for Diabetes and hypertension.  Counseling provided for all of the vaccine components No orders of the defined types were placed in this encounter.   Caryl Pina, MD [facility] 09/29/2018, 11:54 AM

## 2018-09-30 LAB — LIPID PANEL
Chol/HDL Ratio: 2.5 ratio (ref 0.0–4.4)
Cholesterol, Total: 135 mg/dL (ref 100–199)
HDL: 54 mg/dL (ref >39)
LDL Calculated: 67 mg/dL (ref 0–99)
Triglycerides: 72 mg/dL (ref 0–149)
VLDL Cholesterol Cal: 14 mg/dL (ref 5–40)

## 2018-09-30 LAB — CMP14+EGFR
ALT: 15 IU/L (ref 0–32)
AST: 16 IU/L (ref 0–40)
Albumin/Globulin Ratio: 2.1 (ref 1.2–2.2)
Albumin: 4.5 g/dL (ref 3.6–4.6)
Alkaline Phosphatase: 67 IU/L (ref 39–117)
BUN/Creatinine Ratio: 12 (ref 12–28)
BUN: 7 mg/dL — ABNORMAL LOW (ref 8–27)
Bilirubin Total: 0.4 mg/dL (ref 0.0–1.2)
CO2: 24 mmol/L (ref 20–29)
Calcium: 9.6 mg/dL (ref 8.7–10.3)
Chloride: 99 mmol/L (ref 96–106)
Creatinine, Ser: 0.6 mg/dL (ref 0.57–1.00)
GFR calc Af Amer: 97 mL/min/{1.73_m2} (ref >59)
GFR calc non Af Amer: 84 mL/min/{1.73_m2} (ref >59)
Globulin, Total: 2.1 g/dL (ref 1.5–4.5)
Glucose: 111 mg/dL — ABNORMAL HIGH (ref 65–99)
Potassium: 4.5 mmol/L (ref 3.5–5.2)
Sodium: 137 mmol/L (ref 134–144)
Total Protein: 6.6 g/dL (ref 6.0–8.5)

## 2018-11-10 ENCOUNTER — Other Ambulatory Visit: Payer: Self-pay | Admitting: *Deleted

## 2018-11-10 MED ORDER — METFORMIN HCL ER 500 MG PO TB24: ORAL_TABLET | ORAL | 0 refills | Status: DC

## 2018-11-10 MED ORDER — GABAPENTIN 100 MG PO CAPS: ORAL_CAPSULE | ORAL | 1 refills | Status: DC

## 2018-11-10 MED ORDER — AMLODIPINE BESYLATE 2.5 MG PO TABS: 2.5000 mg | ORAL_TABLET | Freq: Every day | ORAL | 0 refills | Status: DC

## 2018-11-14 DIAGNOSIS — Z961 Presence of intraocular lens: Secondary | ICD-10-CM | POA: Diagnosis not present

## 2018-11-14 DIAGNOSIS — E119 Type 2 diabetes mellitus without complications: Secondary | ICD-10-CM | POA: Diagnosis not present

## 2018-11-14 DIAGNOSIS — H40013 Open angle with borderline findings, low risk, bilateral: Secondary | ICD-10-CM | POA: Diagnosis not present

## 2018-11-14 DIAGNOSIS — H43811 Vitreous degeneration, right eye: Secondary | ICD-10-CM | POA: Diagnosis not present

## 2018-11-14 LAB — HM DIABETES EYE EXAM

## 2018-11-15 DIAGNOSIS — E119 Type 2 diabetes mellitus without complications: Secondary | ICD-10-CM | POA: Diagnosis not present

## 2018-12-12 ENCOUNTER — Encounter: Payer: Medicare Other | Admitting: *Deleted

## 2018-12-12 ENCOUNTER — Ambulatory Visit (INDEPENDENT_AMBULATORY_CARE_PROVIDER_SITE_OTHER): Payer: Medicare Other | Admitting: *Deleted

## 2018-12-12 DIAGNOSIS — Z0000 Encounter for general adult medical examination without abnormal findings: Secondary | ICD-10-CM | POA: Diagnosis not present

## 2018-12-12 NOTE — Progress Notes (Signed)
MEDICARE ANNUAL WELLNESS VISIT  12/12/2018  Telephone Visit Disclaimer This Medicare AWV was conducted by telephone due to national recommendations for restrictions regarding the COVID-19 Pandemic (e.g. social distancing).  I verified, using two identifiers, that I am speaking with [name] [name] or their authorized healthcare agent. I discussed the limitations, risks, security, and privacy concerns of performing an evaluation and management service by telephone and the potential availability of an in-person appointment in the future. The patient expressed understanding and agreed to proceed.   Subjective:  [name] [name] is a 85 y.o. female patient of Dettinger, Fransisca Kaufmann, MD who had a Medicare Annual Wellness Visit today via telephone. [name] is Retired and lives alone. she has 2 children. she reports that she is socially active and does interact with friends/family regularly. she is minimally physically active and enjoys reading.  Patient Care Team: Dettinger, Fransisca Kaufmann, MD as PCP - General (Family Medicine) Newt Minion, MD (Orthopedic Surgery) Clent Jacks, MD (Ophthalmology)  Advanced Directives 12/12/2018 12/08/2017 11/26/2016 04/11/2016 11/26/2015 10/26/2014 03/08/2014  Does Patient Have a Medical Advance Directive? No No Yes No Yes No No  Type of Advance Directive - Public librarian;Living will - [place] - -  Does patient want to make changes to medical advance directive? - - No - Patient declined - - - -  Copy of [place] in Chart? - - No - copy requested - No - copy requested - -  Would patient like information on creating a medical advance directive? No - Patient declined No - Patient declined - - - No - patient declined information -    Hospital Utilization Over the Past 12 Months: # of hospitalizations or ER visits: 0 # of surgeries: 0  Review of Systems    Patient reports that her overall health is unchanged  compared to last year.  History obtained from chart review  Patient Reported Readings (BP, Pulse, CBG, Weight, etc) none  Pain Assessment Pain : No/denies pain     Current Medications & Allergies (verified) Allergies as of 12/12/2018      Reactions   Lisinopril Cough   Chlordiazepoxide-clidinium    Can't remember   Clarithromycin    Can't remember   Erymax [erythromycin]    Can't remember   Penicillins    Can't remember   Betadine [povidone Iodine] Rash   Evista [raloxifene Hydrochloride]    Numb feeling   Hydrogen Peroxide Rash   Sertraline Hcl Other (See Comments)   Sweating and mouth irritation   Sulfa Antibiotics Nausea And Vomiting      Medication List       Accurate as of December 12, 2018 10:39 AM. If you have any questions, ask your nurse or doctor.        STOP taking these medications   predniSONE 20 MG tablet Commonly known as: DELTASONE     TAKE these medications   albuterol (2.5 MG/3ML) 0.083% nebulizer solution Commonly known as: PROVENTIL NEBULIZE 1 VIAL EVERY 6 HOURS AS NEEDED FOR SHORTNESS OF BREATH OR WHEEZING   albuterol (2.5 MG/3ML) 0.083% nebulizer solution Commonly known as: PROVENTIL NEBULIZE 1 VIAL EVERY 6 HOURS AS NEEDED FOR SHORTNESS OF BREATH OR WHEEZING   ALPRAZolam 0.25 MG tablet Commonly known as: XANAX TAKE 1/2 TABLET FOUR TIMES DAILY AS NEEDED   amLODipine 2.5 MG tablet Commonly known as: NORVASC Take 1 tablet (2.5 mg total) by mouth daily.   aspirin 81  MG tablet Take 81 mg by mouth every other day.   blood glucose meter kit and supplies Kit Check BS once daily   budesonide-formoterol 160-4.5 MCG/ACT inhaler Commonly known as: SYMBICORT Inhale 2 puffs into the lungs 2 (two) times daily.   cholecalciferol 1000 units tablet Commonly known as: VITAMIN D Take 2,000 Units by mouth daily.   escitalopram 10 MG tablet Commonly known as: LEXAPRO Take 1 tablet (10 mg total) by mouth daily. as directed   fluticasone 50  MCG/ACT nasal spray Commonly known as: FLONASE USE ONE SPRAY in each nostril ONCE daily   gabapentin 100 MG capsule Commonly known as: NEURONTIN TAKE (1) CAPSULE THREE TIMES DAILY.   ketoconazole 2 % shampoo Commonly known as: NIZORAL Apply 1 application topically 2 (two) times a week.   lovastatin 40 MG tablet Commonly known as: MEVACOR Take 1 tablet (40 mg total) by mouth daily.   metFORMIN 500 MG 24 hr tablet Commonly known as: GLUCOPHAGE-XR TAKE 1 TABLET ONCE DAILY WITH BREAKFAST   ONE TOUCH DELICA LANCING DEV Misc Use to check BG up to twice a day. DX: E11.29 - type 2 DM   OneTouch Verio test strip Generic drug: glucose blood USE ONE STRIP TO CHECK GLUCOSE TWICE DAILY AS NEEDED   triamcinolone ointment 0.5 % Commonly known as: KENALOG Apply topically 2 (two) times daily.       History (reviewed): Past Medical History:  Diagnosis Date  . Anxiety   . Arthritis   . Atrophic vaginitis   . Cataract   . COPD (chronic obstructive pulmonary disease) ([place])   . Depressive disorder, not elsewhere classified   . Diabetes mellitus without complication ([place])   . Disorder of bone and cartilage, unspecified   . Elevated blood sugar   . Emphysema lung ([place])   . Hypertension   . Other and unspecified hyperlipidemia   . Postmenopausal   . Rhinitis    Past Surgical History:  Procedure Laterality Date  . APPENDECTOMY  1957  . CATARACT EXTRACTION W/ INTRAOCULAR LENS IMPLANT Bilateral 2005   [place]  . COLONOSCOPY N/A 03/08/2014   Procedure: COLONOSCOPY;  Surgeon: Rogene Houston, MD;  [facility];  Service: Endoscopy;  Laterality: N/A;  . EYE SURGERY    . JOINT REPLACEMENT    . TOTAL HIP ARTHROPLASTY Left 12/27/2013   Procedure: LEFT TOTAL HIP ARTHROPLASTY;  Surgeon: Newt Minion, MD;  Location: [facility];  Service: Orthopedics;  Laterality: Left;   Family History  Problem Relation Age of Onset  . Arthritis Mother   . COPD Father   . Diabetes Father   .  Aneurysm Sister   . Diabetes Sister   . Diabetes Sister   . Hypertension Sister   . COPD Daughter   . Fibromyalgia Daughter   . Healthy Daughter    Social History   Socioeconomic History  . Marital status: Widowed    Spouse name: Not on file  . Number of children: 2  . Years of education: 10  . Highest education level: 10th grade  Occupational History  . 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 Financial resource strain: Not hard at all  . Food insecurity    Worry: Never true    Inability: Never true  . Transportation needs    Medical: No    Non-medical: No  Tobacco Use  . Smoking status: Never Smoker  . Smokeless tobacco: Never Used  Substance  and Sexual Activity  . Alcohol use: No    Alcohol/week: 0.0 standard drinks  . Drug use: No  . Sexual activity: Not Currently  Lifestyle  . Physical activity    Days per week: 0 days    Minutes per session: 0 min  . Stress: Only a little  Relationships  . Social connections    Talks on phone: More than three times a week    Gets together: More than three times a week    Attends religious service: More than 4 times per year    Active member of club or organization: Yes    Attends meetings of clubs or organizations: More than 4 times per year    Relationship status: Widowed  Other Topics Concern  . Not on file  Social History Narrative  . Not on file    Activities of Daily Living In your present state of health, do you have any difficulty performing the following activities: 12/12/2018  Hearing? Y  Comment pt states she isn't going to buy hearing aids  Vision? N  Comment pt does her yearly eye exam  Difficulty concentrating or making decisions? N  Walking or climbing stairs? N  Comment pt doesn't have stairs at her home  Dressing or bathing? N  Doing errands, shopping? Y  Comment she has 2 daughters and they do her grocery shopping and bring her to appointments  Preparing  Food and eating ? N  Using the Toilet? N  In the past six months, have you accidently leaked urine? N  Do you have problems with loss of bowel control? N  Managing your Medications? N  Comment her pharmacy delivers her medications to her home  Managing your Finances? N  Housekeeping or managing your Housekeeping? N  Some recent data might be hidden    Patient Education/ Literacy How often do you need to have someone help you when you read instructions, pamphlets, or other written materials from your doctor or pharmacy?: 1 - Never What is the last grade level you completed in school?: 10th grade  Exercise Current Exercise Habits: The patient does not participate in regular exercise at present, Exercise limited by: None identified  Diet Patient reports consuming 3 meals a day and 1 snack(s) a day Patient reports that her primary diet is: Regular Patient reports that she does have regular access to food.   Depression Screen PHQ 2/9 Scores 12/12/2018 09/29/2018 05/05/2018 03/21/2018 02/15/2018 12/17/2017 12/08/2017  PHQ - 2 Score 0 4 0 0 0 0 0  PHQ- 9 Score - 7 - - - - -     Fall Risk Fall Risk  12/12/2018 09/29/2018 05/05/2018 05/05/2018 03/21/2018  Falls in the past year? 0 1 0 0 0  Number falls in past yr: 0 1 - - -  Injury with Fall? 0 0 - - -  Comment - - - - -  Risk Factor Category  - - - - -  Risk for fall due to : - - - - -  Follow up Falls prevention discussed - - - -  Comment get rid of all throw rugs in the house, adequate lighting in walkways and grab bars in the bathroom - - - -     Objective:  [name] [name] seemed alert and oriented and she participated appropriately during our telephone visit.  Blood Pressure Weight BMI  BP Readings from Last 3 Encounters:  09/29/18 (!) 147/64  05/05/18 136/61  03/21/18 114/63   Wt  Readings from Last 3 Encounters:  09/29/18 147 lb 6.4 oz (66.9 kg)  05/05/18 150 lb (68 kg)  03/21/18 149 lb 12.8 oz (67.9 kg)   BMI Readings from Last  1 Encounters:  09/29/18 26.96 kg/m    *Unable to obtain current vital signs, weight, and BMI due to telephone visit type  Hearing/Vision  . Syrina did not seem to have difficulty with hearing/understanding during the telephone conversation . Reports that she has had a formal eye exam by an eye care professional within the past year . Reports that she has not had a formal hearing evaluation within the past year *Unable to fully assess hearing and vision during telephone visit type  Cognitive Function: 6CIT Screen 12/12/2018  What Year? 0 points  What month? 0 points  What time? 0 points  Count back from 20 0 points  Months in reverse 0 points  Repeat phrase 0 points  Total Score 0   (Normal:0-7, Significant for Dysfunction: >8)  Normal Cognitive Function Screening: Yes   Immunization & Health Maintenance Record Immunization History  Administered Date(s) Administered  . Influenza Whole 12/12/2009  . Influenza, High Dose Seasonal PF 01/01/2016, 01/04/2017, 01/17/2018  . Influenza,inj,Quad PF,6+ Mos 02/01/2013, 12/29/2013, 01/10/2015  . Pneumococcal Conjugate-13 04/24/2013  . Pneumococcal Polysaccharide-23 01/12/2003  . Td 04/13/2006  . Zoster 03/13/2006    Health Maintenance  Topic Date Due  . TETANUS/TDAP  04/13/2016  . HEMOGLOBIN A1C  10/31/2018  . INFLUENZA VACCINE  11/12/2018  . URINE MICROALBUMIN  12/18/2018  . MAMMOGRAM  12/18/2018 (Originally 12/09/2017)  . FOOT EXAM  05/06/2019  . OPHTHALMOLOGY EXAM  11/14/2019  . DEXA SCAN  08/20/2022  . PNA vac Low Risk Adult  Completed       Assessment  This is a routine wellness examination for [name] [name].  Health Maintenance: Due or Overdue Health Maintenance Due  Topic Date Due  . TETANUS/TDAP  04/13/2016  . HEMOGLOBIN A1C  10/31/2018  . INFLUENZA VACCINE  11/12/2018  . URINE MICROALBUMIN  12/18/2018    [name] [name] does not need a referral for Community Assistance: Care Management:   no Social Work:     no Prescription Assistance:  no Nutrition/Diabetes Education:  no   Plan:  Personalized Goals Goals Addressed            This Visit's Progress   . DIET - INCREASE WATER INTAKE       Try to drink 6-8 glasses of water daily.      Personalized Health Maintenance & Screening Recommendations  Influenza vaccine Td vaccine  Lung Cancer Screening Recommended: no (Low Dose CT Chest recommended if Age 16-80 years, 30 pack-year currently smoking OR have quit w/in past 15 years) Hepatitis C Screening recommended: no HIV Screening recommended: no  Advanced Directives: Written information was not prepared per patient's request.  Referrals & Orders No orders of the defined types were placed in this encounter.   Follow-up Plan . Follow-up with Dettinger, Fransisca Kaufmann, MD as planned . Consider Flu and TDAP vaccines at your next visit with your PCP   I have personally reviewed and noted the following in the patient's chart:   . Medical and social history . Use of alcohol, tobacco or illicit drugs  . Current medications and supplements . Functional ability and status . Nutritional status . Physical activity . Advanced directives . List of other physicians . Hospitalizations, surgeries, and ER visits in previous 12 months . Vitals . Screenings to include  cognitive, depression, and falls . Referrals and appointments  In addition, I have reviewed and discussed with [name] [name] certain preventive protocols, quality metrics, and best practice recommendations. A written personalized care plan for preventive services as well as general preventive health recommendations is available and can be mailed to the patient at her request.      Marylin Crosby, LPN  [id number]

## 2018-12-12 NOTE — Patient Instructions (Signed)
Preventive Care 83 Years and Older, Female Preventive care refers to lifestyle choices and visits with your health care provider that can promote health and wellness. This includes:  A yearly physical exam. This is also called an annual well check.  Regular dental and eye exams.  Immunizations.  Screening for certain conditions.  Healthy lifestyle choices, such as diet and exercise. What can I expect for my preventive care visit? Physical exam Your health care provider will check:  Height and weight. These may be used to calculate body mass index (BMI), which is a measurement that tells if you are at a healthy weight.  Heart rate and blood pressure.  Your skin for abnormal spots. Counseling Your health care provider may ask you questions about:  Alcohol, tobacco, and drug use.  Emotional well-being.  Home and relationship well-being.  Sexual activity.  Eating habits.  History of falls.  Memory and ability to understand (cognition).  Work and work Statistician.  Pregnancy and menstrual history. What immunizations do I need?  Influenza (flu) vaccine  This is recommended every year. Tetanus, diphtheria, and pertussis (Tdap) vaccine  You may need a Td booster every 10 years. Varicella (chickenpox) vaccine  You may need this vaccine if you have not already been vaccinated. Zoster (shingles) vaccine  You may need this after age 87. Pneumococcal conjugate (PCV13) vaccine  One dose is recommended after age 60. Pneumococcal polysaccharide (PPSV23) vaccine  One dose is recommended after age 53. Measles, mumps, and rubella (MMR) vaccine  You may need at least one dose of MMR if you were born in 1957 or later. You may also need a second dose. Meningococcal conjugate (MenACWY) vaccine  You may need this if you have certain conditions. Hepatitis A vaccine  You may need this if you have certain conditions or if you travel or work in places where you may be exposed  to hepatitis A. Hepatitis B vaccine  You may need this if you have certain conditions or if you travel or work in places where you may be exposed to hepatitis B. Haemophilus influenzae type b (Hib) vaccine  You may need this if you have certain conditions. You may receive vaccines as individual doses or as more than one vaccine together in one shot (combination vaccines). Talk with your health care provider about the risks and benefits of combination vaccines. What tests do I need? Blood tests  Lipid and cholesterol levels. These may be checked every 5 years, or more frequently depending on your overall health.  Hepatitis C test.  Hepatitis B test. Screening  Lung cancer screening. You may have this screening every year starting at age 57 if you have a 30-pack-year history of smoking and currently smoke or have quit within the past 15 years.  Colorectal cancer screening. All adults should have this screening starting at age 2 and continuing until age 21. Your health care provider may recommend screening at age 59 if you are at increased risk. You will have tests every 1-10 years, depending on your results and the type of screening test.  Diabetes screening. This is done by checking your blood sugar (glucose) after you have not eaten for a while (fasting). You may have this done every 1-3 years.  Mammogram. This may be done every 1-2 years. Talk with your health care provider about how often you should have regular mammograms.  BRCA-related cancer screening. This may be done if you have a family history of breast, ovarian, tubal, or peritoneal cancers.  Other tests  Sexually transmitted disease (STD) testing.  Bone density scan. This is done to screen for osteoporosis. You may have this done starting at age 76. Follow these instructions at home: Eating and drinking  Eat a diet that includes fresh fruits and vegetables, whole grains, lean protein, and low-fat dairy products. Limit  your intake of foods with high amounts of sugar, saturated fats, and salt.  Take vitamin and mineral supplements as recommended by your health care provider.  Do not drink alcohol if your health care provider tells you not to drink.  If you drink alcohol: ? Limit how much you have to 0-1 drink a day. ? Be aware of how much alcohol is in your drink. In the [place] one drink equals one 12 oz bottle of beer (355 mL), one 5 oz glass of wine (148 mL), or one 1 oz glass of hard liquor (44 mL). Lifestyle  Take daily care of your teeth and gums.  Stay active. Exercise for at least 30 minutes on 5 or more days each week.  Do not use any products that contain nicotine or tobacco, such as cigarettes, e-cigarettes, and chewing tobacco. If you need help quitting, ask your health care provider.  If you are sexually active, practice safe sex. Use a condom or other form of protection in order to prevent STIs (sexually transmitted infections).  Talk with your health care provider about taking a low-dose aspirin or statin. What's next?  Go to your health care provider once a year for a well check visit.  Ask your health care provider how often you should have your eyes and teeth checked.  Stay up to date on all vaccines. This information is not intended to replace advice given to you by your health care provider. Make sure you discuss any questions you have with your health care provider. Document Released: 04/26/2015 Document Revised: 03/24/2018 Document Reviewed: 03/24/2018 Elsevier Patient Education  2020 [employer].

## 2018-12-15 ENCOUNTER — Ambulatory Visit (INDEPENDENT_AMBULATORY_CARE_PROVIDER_SITE_OTHER): Payer: Medicare Other | Admitting: Family Medicine

## 2018-12-15 ENCOUNTER — Other Ambulatory Visit: Payer: Self-pay

## 2018-12-15 ENCOUNTER — Telehealth: Payer: Self-pay | Admitting: Family Medicine

## 2018-12-15 ENCOUNTER — Encounter: Payer: Self-pay | Admitting: Family Medicine

## 2018-12-15 VITALS — BP 125/66 | HR 65 | Temp 97.3°F | Ht 62.0 in | Wt 149.0 lb

## 2018-12-15 DIAGNOSIS — R29898 Other symptoms and signs involving the musculoskeletal system: Secondary | ICD-10-CM | POA: Diagnosis not present

## 2018-12-15 DIAGNOSIS — Z9181 History of falling: Secondary | ICD-10-CM | POA: Diagnosis not present

## 2018-12-15 DIAGNOSIS — M6281 Muscle weakness (generalized): Secondary | ICD-10-CM | POA: Diagnosis not present

## 2018-12-15 NOTE — Progress Notes (Signed)
BP 125/66   Pulse 65   Temp (!) 97.3 F (36.3 C) (Temporal)   Ht 5' 2" (1.575 m)   Wt 149 lb (67.6 kg)   BMI 27.25 kg/m    Subjective:   Patient ID: [name] [name], female    DOB: [birth year], 85 y.o.   MRN: [id number]  HPI: [name] [name] is a 85 y.o. female presenting on 12/15/2018 for Leg Pain (Bilateral leg pain that has been going on for a week)   HPI Patient has bilateral leg pain that she is coming in complaining of.  She says it hurts in both of her hips or thighs and hurts in the muscles of both.  She denies any specific area in her joints that hurts but it does hurt more in her thighs than anywhere else.  Patient denies any fevers or chills or back pain or pain anywhere else except for in her legs.  She says she does feel more weak because she is been sitting around a lot during coronavirus.  Relevant past medical, surgical, family and social history reviewed and updated as indicated. Interim medical history since our last visit reviewed. Allergies and medications reviewed and updated.  Review of Systems  Constitutional: Negative for chills and fever.  Eyes: Negative for visual disturbance.  Respiratory: Negative for chest tightness and shortness of breath.   Cardiovascular: Negative for chest pain and leg swelling.  Musculoskeletal: Positive for arthralgias and myalgias. Negative for back pain and gait problem.  Skin: Negative for rash.  Neurological: Negative for light-headedness and headaches.  All other systems reviewed and are negative.   Per HPI unless specifically indicated above   Allergies as of 12/15/2018      Reactions   Lisinopril Cough   Chlordiazepoxide-clidinium    Can't remember   Clarithromycin    Can't remember   Erymax [erythromycin]    Can't remember   Penicillins    Can't remember   Betadine [povidone Iodine] Rash   Evista [raloxifene Hydrochloride]    Numb feeling   Hydrogen Peroxide Rash   Sertraline Hcl Other (See Comments)   Sweating and mouth irritation   Sulfa Antibiotics Nausea And Vomiting      Medication List       Accurate as of December 15, 2018 11:23 AM. If you have any questions, ask your nurse or doctor.        albuterol (2.5 MG/3ML) 0.083% nebulizer solution Commonly known as: PROVENTIL NEBULIZE 1 VIAL EVERY 6 HOURS AS NEEDED FOR SHORTNESS OF BREATH OR WHEEZING   albuterol (2.5 MG/3ML) 0.083% nebulizer solution Commonly known as: PROVENTIL NEBULIZE 1 VIAL EVERY 6 HOURS AS NEEDED FOR SHORTNESS OF BREATH OR WHEEZING   ALPRAZolam 0.25 MG tablet Commonly known as: XANAX TAKE 1/2 TABLET FOUR TIMES DAILY AS NEEDED   amLODipine 2.5 MG tablet Commonly known as: NORVASC Take 1 tablet (2.5 mg total) by mouth daily.   aspirin 81 MG tablet Take 81 mg by mouth every other day.   blood glucose meter kit and supplies Kit Check BS once daily   budesonide-formoterol 160-4.5 MCG/ACT inhaler Commonly known as: SYMBICORT Inhale 2 puffs into the lungs 2 (two) times daily.   cholecalciferol 1000 units tablet Commonly known as: VITAMIN D Take 2,000 Units by mouth daily.   escitalopram 10 MG tablet Commonly known as: LEXAPRO Take 1 tablet (10 mg total) by mouth daily. as directed   fluticasone 50 MCG/ACT nasal spray Commonly known as: FLONASE USE ONE SPRAY in  each nostril ONCE daily   gabapentin 100 MG capsule Commonly known as: NEURONTIN TAKE (1) CAPSULE THREE TIMES DAILY.   ketoconazole 2 % shampoo Commonly known as: NIZORAL Apply 1 application topically 2 (two) times a week.   lovastatin 40 MG tablet Commonly known as: MEVACOR Take 1 tablet (40 mg total) by mouth daily.   metFORMIN 500 MG 24 hr tablet Commonly known as: GLUCOPHAGE-XR TAKE 1 TABLET ONCE DAILY WITH BREAKFAST   ONE TOUCH DELICA LANCING DEV Misc Use to check BG up to twice a day. DX: E11.29 - type 2 DM   OneTouch Verio test strip Generic drug: glucose blood USE ONE STRIP TO CHECK GLUCOSE TWICE DAILY AS NEEDED    triamcinolone ointment 0.5 % Commonly known as: KENALOG Apply topically 2 (two) times daily.        Objective:   BP 125/66   Pulse 65   Temp (!) 97.3 F (36.3 C) (Temporal)   Ht 5' 2" (1.575 m)   Wt 149 lb (67.6 kg)   BMI 27.25 kg/m   Wt Readings from Last 3 Encounters:  12/15/18 149 lb (67.6 kg)  09/29/18 147 lb 6.4 oz (66.9 kg)  05/05/18 150 lb (68 kg)    Physical Exam Vitals signs and nursing note reviewed.  Constitutional:      General: She is not in acute distress.    Appearance: She is well-developed. She is not diaphoretic.  Eyes:     Conjunctiva/sclera: Conjunctivae normal.  Cardiovascular:     Rate and Rhythm: Normal rate and regular rhythm.     Heart sounds: Normal heart sounds. No murmur.  Pulmonary:     Effort: Pulmonary effort is normal. No respiratory distress.     Breath sounds: Normal breath sounds. No wheezing.  Musculoskeletal: Normal range of motion.        General: Tenderness present.  Skin:    General: Skin is warm and dry.     Findings: No rash.  Neurological:     Mental Status: She is alert and oriented to person, place, and time.     Motor: Weakness (3 out of 5 strength in bilateral lower extremities) present.     Coordination: Coordination normal.  Psychiatric:        Behavior: Behavior normal.     Results for orders placed or performed in visit on 11/22/18  HM DIABETES EYE EXAM  Result Value Ref Range   HM Diabetic Eye Exam No Retinopathy No Retinopathy    Assessment & Plan:   Problem List Items Addressed This Visit    None    Visit Diagnoses    Muscular weakness    -  Primary   Relevant Orders   Ambulatory referral to Home Health   Weakness of both lower extremities       Relevant Orders   Ambulatory referral to [place]   At high risk for injury related to fall       Relevant Orders   Ambulatory referral to [place] deconditioning       Relevant Orders   Ambulatory referral to [place]       Will do referral to home health, think that she is becoming significantly weaker due to coronavirus and being stuck at home all the time. Follow up plan: Return in about 4 weeks (around 01/12/2019), or if symptoms worsen or fail to improve, for Diabetes recheck.  Counseling provided for all of the vaccine components No orders of the  defined types were placed in this encounter.   Caryl Pina, MD [facility] 12/15/2018, 11:23 AM

## 2018-12-15 NOTE — Telephone Encounter (Signed)
FYI, patient does not want home health, does not want anyone coming to her house.

## 2018-12-15 NOTE — Telephone Encounter (Signed)
This has been noted on referral and not sent to a [facility] agency

## 2018-12-20 DIAGNOSIS — Z029 Encounter for administrative examinations, unspecified: Secondary | ICD-10-CM

## 2018-12-21 ENCOUNTER — Other Ambulatory Visit: Payer: Self-pay

## 2018-12-21 ENCOUNTER — Ambulatory Visit (INDEPENDENT_AMBULATORY_CARE_PROVIDER_SITE_OTHER): Payer: Medicare Other

## 2018-12-21 DIAGNOSIS — Z23 Encounter for immunization: Secondary | ICD-10-CM | POA: Diagnosis not present

## 2018-12-29 ENCOUNTER — Telehealth: Payer: Self-pay | Admitting: Family Medicine

## 2018-12-29 ENCOUNTER — Ambulatory Visit: Payer: Medicare Other | Admitting: Family Medicine

## 2018-12-30 NOTE — Telephone Encounter (Signed)
I do not have anything of the sort on my desk, I do not recall if I already done it but if I had I do not know for sure if it is been sent or not, can we track this down

## 2018-12-30 NOTE — Telephone Encounter (Signed)
Pt said we mailed the completed form to her. I assured her it was coming, our mail must go to [place] via carrier first.

## 2019-01-09 ENCOUNTER — Encounter: Payer: Self-pay | Admitting: Family Medicine

## 2019-01-09 ENCOUNTER — Ambulatory Visit (INDEPENDENT_AMBULATORY_CARE_PROVIDER_SITE_OTHER): Payer: Medicare Other | Admitting: Family Medicine

## 2019-01-09 DIAGNOSIS — J441 Chronic obstructive pulmonary disease with (acute) exacerbation: Secondary | ICD-10-CM

## 2019-01-09 MED ORDER — DOXYCYCLINE HYCLATE 100 MG PO TABS: 100.0000 mg | ORAL_TABLET | Freq: Two times a day (BID) | ORAL | 0 refills | Status: AC

## 2019-01-09 MED ORDER — PREDNISONE 20 MG PO TABS: ORAL_TABLET | ORAL | 0 refills | Status: DC

## 2019-01-09 MED ORDER — BENZONATATE 100 MG PO CAPS: 100.0000 mg | ORAL_CAPSULE | Freq: Three times a day (TID) | ORAL | 0 refills | Status: DC | PRN

## 2019-01-09 NOTE — Progress Notes (Signed)
Virtual Visit via telephone Note Due to COVID-19 pandemic this visit was conducted virtually. This visit type was conducted due to national recommendations for restrictions regarding the COVID-19 Pandemic (e.g. social distancing, sheltering in place) in an effort to limit this patient's exposure and mitigate transmission in our community. All issues noted in this document were discussed and addressed.  A physical exam was not performed with this format.   I connected with [name] [name] on 01/09/19 at 0955 by telephone and verified that I am speaking with the correct person using two identifiers. [name] [name] is currently located at home and no one is currently with them during visit. The provider, Monia Pouch, FNP is located in their office at time of visit.  I discussed the limitations, risks, security and privacy concerns of performing an evaluation and management service by telephone and the availability of in person appointments. I also discussed with the patient that there may be a patient responsible charge related to this service. The patient expressed understanding and agreed to proceed.  Subjective:  Patient ID: [name] [name], female    DOB: Jun 03, 1931, 85 y.o.   MRN: [id number]  Chief Complaint:  Cough   HPI: [name] [name] is a 85 y.o. female presenting on 01/09/2019 for Cough   Pt reports increased cough, exertional shortness of breath, congestion, and sputum production. Pt states this has been ongoing for a few days and does not seem to be improving. Pt states she has had to use her inhaler more frequently. Denies sick exposure. No recent travel. Has been sheltering in place. No fever, chills, or fatigue. No confusion or weaknesss.   Cough This is a new problem. The current episode started in the past 7 days. The problem has been gradually worsening. The problem occurs every few minutes. The cough is productive of brown sputum. Associated symptoms include nasal  congestion, rhinorrhea, shortness of breath and wheezing. Pertinent negatives include no chest pain, chills, ear congestion, fever, headaches, heartburn, hemoptysis, myalgias, postnasal drip, rash, sore throat, sweats or weight loss. The symptoms are aggravated by exercise. She has tried a beta-agonist inhaler and steroid inhaler for the symptoms. The treatment provided mild relief.     Relevant past medical, surgical, family, and social history reviewed and updated as indicated.  Allergies and medications reviewed and updated.   Past Medical History:  Diagnosis Date   Anxiety    Arthritis    Atrophic vaginitis    Cataract    COPD (chronic obstructive pulmonary disease) (HCC)    Depressive disorder, not elsewhere classified    Diabetes mellitus without complication (HCC)    Disorder of bone and cartilage, unspecified    Elevated blood sugar    Emphysema lung ([place])    Hypertension    Other and unspecified hyperlipidemia    Postmenopausal    Rhinitis     Past Surgical History:  Procedure Laterality Date   APPENDECTOMY  1957   CATARACT EXTRACTION W/ INTRAOCULAR LENS IMPLANT Bilateral 2005   [place]   COLONOSCOPY N/A 03/08/2014   Procedure: COLONOSCOPY;  Surgeon: Rogene Houston, MD;  Location: [facility];  Service: Endoscopy;  Laterality: N/A;   EYE SURGERY     JOINT REPLACEMENT     TOTAL HIP ARTHROPLASTY Left 12/27/2013   Procedure: LEFT TOTAL HIP ARTHROPLASTY;  Surgeon: Newt Minion, MD;  Location: [facility];  Service: Orthopedics;  Laterality: Left;    Social History   Socioeconomic History   Marital status:  Widowed    Spouse name: Not on file   Number of children: 2   Years of education: 10   Highest education level: 10th grade  Occupational History   Occupation: retired    Comment: Regulatory affairs officer and worked at [place]: Not hard at [employer] insecurity    Worry: Never true    Inability: Never true    Transportation needs    Medical: No    Non-medical: No  Tobacco Use   Smoking status: Never Smoker   Smokeless tobacco: Never Used  Substance and Sexual Activity   Alcohol use: No    Alcohol/week: 0.0 standard drinks   Drug use: No   Sexual activity: Not Currently  Lifestyle   Physical activity    Days per week: 0 days    Minutes per session: 0 min   Stress: Only a little  Relationships   Social connections    Talks on phone: More than three times a week    Gets together: More than three times a week    Attends religious service: More than 4 times per year    Active member of club or organization: Yes    Attends meetings of clubs or organizations: More than 4 times per year    Relationship status: Widowed   Intimate partner violence    Fear of current or ex partner: No    Emotionally abused: No    Physically abused: No    Forced sexual activity: No  Other Topics Concern   Not on file  Social History Narrative   Not on file    Outpatient Encounter Medications as of 01/09/2019  Medication Sig   albuterol (PROVENTIL) (2.5 MG/3ML) 0.083% nebulizer solution NEBULIZE 1 VIAL EVERY 6 HOURS AS NEEDED FOR SHORTNESS OF BREATH OR WHEEZING   albuterol (PROVENTIL) (2.5 MG/3ML) 0.083% nebulizer solution NEBULIZE 1 VIAL EVERY 6 HOURS AS NEEDED FOR SHORTNESS OF BREATH OR WHEEZING   ALPRAZolam (XANAX) 0.25 MG tablet TAKE 1/2 TABLET FOUR TIMES DAILY AS NEEDED   amLODipine (NORVASC) 2.5 MG tablet Take 1 tablet (2.5 mg total) by mouth daily.   aspirin 81 MG tablet Take 81 mg by mouth every other day.   benzonatate (TESSALON PERLES) 100 MG capsule Take 1 capsule (100 mg total) by mouth 3 (three) times daily as needed for cough.   blood glucose meter kit and supplies KIT Check BS once daily   budesonide-formoterol (SYMBICORT) 160-4.5 MCG/ACT inhaler Inhale 2 puffs into the lungs 2 (two) times daily.   cholecalciferol (VITAMIN D) 1000 UNITS tablet Take 2,000 Units by mouth  daily.   doxycycline (VIBRA-TABS) 100 MG tablet Take 1 tablet (100 mg total) by mouth 2 (two) times daily for 7 days. 1 po bid   escitalopram (LEXAPRO) 10 MG tablet Take 1 tablet (10 mg total) by mouth daily. as directed   fluticasone (FLONASE) 50 MCG/ACT nasal spray USE ONE SPRAY in each nostril ONCE daily   gabapentin (NEURONTIN) 100 MG capsule TAKE (1) CAPSULE THREE TIMES DAILY.   ketoconazole (NIZORAL) 2 % shampoo Apply 1 application topically 2 (two) times a week.   Lancet Devices (ONE TOUCH DELICA LANCING DEV) MISC Use to check BG up to twice a day. DX: E11.29 - type 2 DM   lovastatin (MEVACOR) 40 MG tablet Take 1 tablet (40 mg total) by mouth daily.   metFORMIN (GLUCOPHAGE-XR) 500 MG 24 hr tablet TAKE 1 TABLET ONCE DAILY WITH BREAKFAST  ONETOUCH VERIO test strip USE ONE STRIP TO CHECK GLUCOSE TWICE DAILY AS NEEDED   predniSONE (DELTASONE) 20 MG tablet 2 po at sametime daily for 5 days   triamcinolone ointment (KENALOG) 0.5 % Apply topically 2 (two) times daily.   No facility-administered encounter medications on file as of 01/09/2019.     Allergies  Allergen Reactions   Lisinopril Cough   Chlordiazepoxide-Clidinium     Can't remember   Clarithromycin     Can't remember   Erymax [Erythromycin]     Can't remember   Penicillins     Can't remember   Betadine [Povidone Iodine] Rash   Evista [Raloxifene Hydrochloride]     Numb feeling   Hydrogen Peroxide Rash   Sertraline Hcl Other (See Comments)    Sweating and mouth irritation    Sulfa Antibiotics Nausea And Vomiting    Review of Systems  Constitutional: Positive for activity change. Negative for appetite change, chills, diaphoresis, fatigue, fever, unexpected weight change and weight loss.  HENT: Positive for congestion and rhinorrhea. Negative for postnasal drip, sinus pressure, sinus pain, sore throat, trouble swallowing and voice change.   Eyes: Negative.  Negative for photophobia and visual  disturbance.  Respiratory: Positive for cough, shortness of breath and wheezing. Negative for hemoptysis and chest tightness.   Cardiovascular: Negative for chest pain, palpitations and leg swelling.  Gastrointestinal: Negative for abdominal pain, blood in stool, constipation, diarrhea, heartburn, nausea and vomiting.  Endocrine: Negative.   Genitourinary: Negative for decreased urine volume, difficulty urinating, dysuria, frequency and urgency.  Musculoskeletal: Negative for arthralgias and myalgias.  Skin: Negative.  Negative for color change, pallor and rash.  Allergic/Immunologic: Negative.   Neurological: Negative for dizziness, tremors, seizures, syncope, facial asymmetry, speech difficulty, weakness, light-headedness, numbness and headaches.  Hematological: Negative.   Psychiatric/Behavioral: Negative for confusion, hallucinations, sleep disturbance and suicidal ideas.  All other systems reviewed and are negative.        Observations/Objective: No vital signs or physical exam, this was a telephone or virtual health encounter.  Pt alert and oriented, answers all questions appropriately, and able to speak in full sentences.    Assessment and Plan: Michole was seen today for cough.  Diagnoses and all orders for this visit:  COPD exacerbation ([place]) Reported symptoms consistent with acute COPD exacerbation. Pt does not wish to go to ED for evaluation. States she has been controlling her shortness of breath and cough well with her breathing treatments at home. No weakness or confusion. Pt has increased exertional shortness of breath, cough, wheezing, and sputum production. Will place on doxycycline and prednisone. Pt to continue all other medications as prescribed. Tessalon as needed for cough. Pt aware to report any new, worsening, or persistent symptoms. Pt aware of symptoms that require emergent evaluation. Follow up in 1-2 weeks or sooner if needed. Medications as prescribed.  -      doxycycline (VIBRA-TABS) 100 MG tablet; Take 1 tablet (100 mg total) by mouth 2 (two) times daily for 7 days. 1 po bid -     predniSONE (DELTASONE) 20 MG tablet; 2 po at sametime daily for 5 days -     benzonatate (TESSALON PERLES) 100 MG capsule; Take 1 capsule (100 mg total) by mouth 3 (three) times daily as needed for cough.     Follow Up Instructions: Return in about 1 week (around 01/16/2019), or if symptoms worsen or fail to improve, for COPD exacerbation.    I discussed the assessment and treatment plan with  the patient. The patient was provided an opportunity to ask questions and all were answered. The patient agreed with the plan and demonstrated an understanding of the instructions.   The patient was advised to call back or seek an in-person evaluation if the symptoms worsen or if the condition fails to improve as anticipated.  The above assessment and management plan was discussed with the patient. The patient verbalized understanding of and has agreed to the management plan. Patient is aware to call the clinic if they develop any new symptoms or if symptoms persist or worsen. Patient is aware when to return to the clinic for a follow-up visit. Patient educated on when it is appropriate to go to the emergency department.    I provided 15 minutes of non-face-to-face time during this encounter. The call started at 0955. The call ended at 1010. The other time was used for coordination of care.    Monia Pouch, FNP-C [facility] [address] [place], [place] [address] [phone] 01/09/19

## 2019-01-18 ENCOUNTER — Telehealth: Payer: Self-pay | Admitting: Family Medicine

## 2019-01-18 NOTE — Telephone Encounter (Signed)
Spoke with patient and she stated that she could not tolerate the prednisone that she was given. It made her BS go way up and she was scared to take any more. She only took 3 days and she stopped it. She feels much better and is continuing with the breathing treatments. Advised patient if she needs anything further to contact the office.Patient verbalized understanding

## 2019-02-01 ENCOUNTER — Encounter: Payer: Self-pay | Admitting: Family Medicine

## 2019-02-01 ENCOUNTER — Ambulatory Visit (INDEPENDENT_AMBULATORY_CARE_PROVIDER_SITE_OTHER): Payer: Medicare Other | Admitting: Family Medicine

## 2019-02-01 DIAGNOSIS — J449 Chronic obstructive pulmonary disease, unspecified: Secondary | ICD-10-CM

## 2019-02-01 NOTE — Progress Notes (Signed)
Virtual Visit via telephone Note Due to COVID-19 pandemic this visit was conducted virtually. This visit type was conducted due to national recommendations for restrictions regarding the COVID-19 Pandemic (e.g. social distancing, sheltering in place) in an effort to limit this patient's exposure and mitigate transmission in our community. All issues noted in this document were discussed and addressed.  A physical exam was not performed with this format.   I connected with [name] [name] on 02/01/2019 at 1330 by telephone and verified that I am speaking with the correct person using two identifiers. [name] [name] is currently located at home and no one is currently with them during visit. The provider, Monia Pouch, FNP is located in their office at time of visit.  I discussed the limitations, risks, security and privacy concerns of performing an evaluation and management service by telephone and the availability of in person appointments. I also discussed with the patient that there may be a patient responsible charge related to this service. The patient expressed understanding and agreed to proceed.  Subjective:  Patient ID: [name] [name], female    DOB: November 30, 1931, 85 y.o.   MRN: [id number]  Chief Complaint:  COPD   HPI: [name] [name] is a 85 y.o. female presenting on 02/01/2019 for COPD   Pt following up today after COPD exacerbation treatment. Pt states she is feeling much better. States she only took a few prednisone pills and then stopped due to her elevated blood sugars. Pt states she is taking a breathing treatment every other day. States she completed the course of antibiotics. Pt reports slight exertional shortness of breath. States she continued to have a cough, slight sputum production. No fever, chills, weakness, or confusion.   COPD She complains of cough and sputum production. There is no chest tightness, difficulty breathing, frequent throat clearing, hemoptysis, hoarse  voice, shortness of breath or wheezing. This is a chronic problem. The problem has been waxing and waning. The cough is productive of sputum. Associated symptoms include dyspnea on exertion. Pertinent negatives include no appetite change, chest pain, ear congestion, ear pain, fever, headaches, heartburn, malaise/fatigue, myalgias, nasal congestion, orthopnea, PND, postnasal drip, rhinorrhea, sneezing, sore throat, sweats, trouble swallowing or weight loss. Her symptoms are aggravated by exercise and strenuous activity. Her symptoms are alleviated by beta-agonist and rest. She reports complete improvement on treatment. Her past medical history is significant for COPD.     Relevant past medical, surgical, family, and social history reviewed and updated as indicated.  Allergies and medications reviewed and updated.   Past Medical History:  Diagnosis Date  . Anxiety   . Arthritis   . Atrophic vaginitis   . Cataract   . COPD (chronic obstructive pulmonary disease) ([place])   . Depressive disorder, not elsewhere classified   . Diabetes mellitus without complication ([place])   . Disorder of bone and cartilage, unspecified   . Elevated blood sugar   . Emphysema lung ([place])   . Hypertension   . Other and unspecified hyperlipidemia   . Postmenopausal   . Rhinitis     Past Surgical History:  Procedure Laterality Date  . APPENDECTOMY  1957  . CATARACT EXTRACTION W/ INTRAOCULAR LENS IMPLANT Bilateral 2005   [place]  . COLONOSCOPY N/A 03/08/2014   Procedure: COLONOSCOPY;  Surgeon: Rogene Houston, MD;  Location: [facility];  Service: Endoscopy;  Laterality: N/A;  . EYE SURGERY    . JOINT REPLACEMENT    . TOTAL HIP ARTHROPLASTY Left 12/27/2013  Procedure: LEFT TOTAL HIP ARTHROPLASTY;  Surgeon: Newt Minion, MD;  Location: [facility];  Service: Orthopedics;  Laterality: Left;    Social History   Socioeconomic History  . Marital status: Widowed    Spouse name: Not on file  . Number of children: 2   . Years of education: 10  . Highest education level: 10th grade  Occupational History  . 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 Financial resource strain: Not hard at all  . Food insecurity    Worry: Never true    Inability: Never true  . Transportation needs    Medical: No    Non-medical: No  Tobacco Use  . Smoking status: Never Smoker  . Smokeless tobacco: Never Used  Substance and Sexual Activity  . Alcohol use: No    Alcohol/week: 0.0 standard drinks  . Drug use: No  . Sexual activity: Not Currently  Lifestyle  . Physical activity    Days per week: 0 days    Minutes per session: 0 min  . Stress: Only a little  Relationships  . Social connections    Talks on phone: More than three times a week    Gets together: More than three times a week    Attends religious service: More than 4 times per year    Active member of club or organization: Yes    Attends meetings of clubs or organizations: More than 4 times per year    Relationship status: Widowed  . Intimate partner violence    Fear of current or ex partner: No    Emotionally abused: No    Physically abused: No    Forced sexual activity: No  Other Topics Concern  . Not on file  Social History Narrative  . Not on file    Outpatient Encounter Medications as of 02/01/2019  Medication Sig  . albuterol (PROVENTIL) (2.5 MG/3ML) 0.083% nebulizer solution NEBULIZE 1 VIAL EVERY 6 HOURS AS NEEDED FOR SHORTNESS OF BREATH OR WHEEZING  . albuterol (PROVENTIL) (2.5 MG/3ML) 0.083% nebulizer solution NEBULIZE 1 VIAL EVERY 6 HOURS AS NEEDED FOR SHORTNESS OF BREATH OR WHEEZING  . ALPRAZolam (XANAX) 0.25 MG tablet TAKE 1/2 TABLET FOUR TIMES DAILY AS NEEDED  . amLODipine (NORVASC) 2.5 MG tablet Take 1 tablet (2.5 mg total) by mouth daily.  Marland Kitchen aspirin 81 MG tablet Take 81 mg by mouth every other day.  . benzonatate (TESSALON PERLES) 100 MG capsule Take 1 capsule (100 mg total) by mouth 3  (three) times daily as needed for cough.  . blood glucose meter kit and supplies KIT Check BS once daily  . budesonide-formoterol (SYMBICORT) 160-4.5 MCG/ACT inhaler Inhale 2 puffs into the lungs 2 (two) times daily.  . cholecalciferol (VITAMIN D) 1000 UNITS tablet Take 2,000 Units by mouth daily.  Marland Kitchen escitalopram (LEXAPRO) 10 MG tablet Take 1 tablet (10 mg total) by mouth daily. as directed  . fluticasone (FLONASE) 50 MCG/ACT nasal spray USE ONE SPRAY in each nostril ONCE daily  . gabapentin (NEURONTIN) 100 MG capsule TAKE (1) CAPSULE THREE TIMES DAILY.  Marland Kitchen ketoconazole (NIZORAL) 2 % shampoo Apply 1 application topically 2 (two) times a week.  Elmore Guise Devices (ONE TOUCH DELICA LANCING DEV) MISC Use to check BG up to twice a day. DX: E11.29 - type 2 DM  . lovastatin (MEVACOR) 40 MG tablet Take 1 tablet (40 mg total) by mouth daily.  . metFORMIN (GLUCOPHAGE-XR) 500 MG 24 hr  tablet TAKE 1 TABLET ONCE DAILY WITH BREAKFAST  . ONETOUCH VERIO test strip USE ONE STRIP TO CHECK GLUCOSE TWICE DAILY AS NEEDED  . predniSONE (DELTASONE) 20 MG tablet 2 po at sametime daily for 5 days  . triamcinolone ointment (KENALOG) 0.5 % Apply topically 2 (two) times daily.   No facility-administered encounter medications on file as of 02/01/2019.     Allergies  Allergen Reactions  . Lisinopril Cough  . Chlordiazepoxide-Clidinium     Can't remember  . Clarithromycin     Can't remember  . Erymax [Erythromycin]     Can't remember  . Penicillins     Can't remember  . Betadine [Povidone Iodine] Rash  . Evista [Raloxifene Hydrochloride]     Numb feeling  . Hydrogen Peroxide Rash  . Sertraline Hcl Other (See Comments)    Sweating and mouth irritation   . Sulfa Antibiotics Nausea And Vomiting    Review of Systems  Constitutional: Negative for activity change, appetite change, chills, diaphoresis, fatigue, fever, malaise/fatigue, unexpected weight change and weight loss.  HENT: Negative for congestion, ear  pain, hoarse voice, postnasal drip, rhinorrhea, sneezing, sore throat and trouble swallowing.   Respiratory: Positive for cough and sputum production. Negative for hemoptysis, chest tightness, shortness of breath and wheezing.   Cardiovascular: Positive for dyspnea on exertion. Negative for chest pain, palpitations and PND.  Gastrointestinal: Negative for heartburn.  Genitourinary: Negative for decreased urine volume and difficulty urinating.  Musculoskeletal: Negative for myalgias.  Skin: Negative for color change and pallor.  Neurological: Negative for weakness and headaches.  Psychiatric/Behavioral: Negative for confusion.  All other systems reviewed and are negative.        Observations/Objective: No vital signs or physical exam, this was a telephone or virtual health encounter.  Pt alert and oriented, answers all questions appropriately, and able to speak in full sentences.    Assessment and Plan: Yaslene was seen today for copd.  Diagnoses and all orders for this visit:  Chronic obstructive pulmonary disease ([place]) Symptoms have greatly improved. Continue medications as prescribed. Report any returned or worsening symptoms.     Follow Up Instructions: Return if symptoms worsen or fail to improve.    I discussed the assessment and treatment plan with the patient. The patient was provided an opportunity to ask questions and all were answered. The patient agreed with the plan and demonstrated an understanding of the instructions.   The patient was advised to call back or seek an in-person evaluation if the symptoms worsen or if the condition fails to improve as anticipated.  The above assessment and management plan was discussed with the patient. The patient verbalized understanding of and has agreed to the management plan. Patient is aware to call the clinic if they develop any new symptoms or if symptoms persist or worsen. Patient is aware when to return to the clinic for a  follow-up visit. Patient educated on when it is appropriate to go to the emergency department.    I provided 15 minutes of non-face-to-face time during this encounter. The call started at 1330. The call ended at 1345. The other time was used for coordination of care.    Monia Pouch, FNP-C [facility] [address] [place], [place] [address] [phone] 02/01/2019

## 2019-02-03 ENCOUNTER — Other Ambulatory Visit: Payer: Medicare Other

## 2019-02-03 ENCOUNTER — Other Ambulatory Visit: Payer: Self-pay

## 2019-02-03 ENCOUNTER — Other Ambulatory Visit: Payer: Self-pay | Admitting: Family Medicine

## 2019-02-03 DIAGNOSIS — E782 Mixed hyperlipidemia: Secondary | ICD-10-CM

## 2019-02-03 DIAGNOSIS — I10 Essential (primary) hypertension: Secondary | ICD-10-CM

## 2019-02-03 DIAGNOSIS — E559 Vitamin D deficiency, unspecified: Secondary | ICD-10-CM | POA: Diagnosis not present

## 2019-02-03 DIAGNOSIS — E118 Type 2 diabetes mellitus with unspecified complications: Secondary | ICD-10-CM | POA: Diagnosis not present

## 2019-02-03 LAB — BAYER DCA HB A1C WAIVED: HB A1C (BAYER DCA - WAIVED): 6.3 % (ref <7.0)

## 2019-02-04 LAB — CBC WITH DIFFERENTIAL/PLATELET
Basophils Absolute: 0 10*3/uL (ref 0.0–0.2)
Basos: 0 %
EOS (ABSOLUTE): 0.1 10*3/uL (ref 0.0–0.4)
Eos: 1 %
Hematocrit: 43.4 % (ref 34.0–46.6)
Hemoglobin: 14.7 g/dL (ref 11.1–15.9)
Immature Grans (Abs): 0 10*3/uL (ref 0.0–0.1)
Immature Granulocytes: 0 %
Lymphocytes Absolute: 2.3 10*3/uL (ref 0.7–3.1)
Lymphs: 33 %
MCH: 30.4 pg (ref 26.6–33.0)
MCHC: 33.9 g/dL (ref 31.5–35.7)
MCV: 90 fL (ref 79–97)
Monocytes Absolute: 0.4 10*3/uL (ref 0.1–0.9)
Monocytes: 6 %
Neutrophils Absolute: 4.2 10*3/uL (ref 1.4–7.0)
Neutrophils: 60 %
Platelets: 324 10*3/uL (ref 150–450)
RBC: 4.83 x10E6/uL (ref 3.77–5.28)
RDW: 12.8 % (ref 11.7–15.4)
WBC: 7.1 10*3/uL (ref 3.4–10.8)

## 2019-02-04 LAB — CMP14+EGFR
ALT: 15 IU/L (ref 0–32)
AST: 19 IU/L (ref 0–40)
Albumin/Globulin Ratio: 1.8 (ref 1.2–2.2)
Albumin: 4.5 g/dL (ref 3.6–4.6)
Alkaline Phosphatase: 83 IU/L (ref 39–117)
BUN/Creatinine Ratio: 13 (ref 12–28)
BUN: 9 mg/dL (ref 8–27)
Bilirubin Total: 0.6 mg/dL (ref 0.0–1.2)
CO2: 21 mmol/L (ref 20–29)
Calcium: 9.6 mg/dL (ref 8.7–10.3)
Chloride: 97 mmol/L (ref 96–106)
Creatinine, Ser: 0.69 mg/dL (ref 0.57–1.00)
GFR calc Af Amer: 92 mL/min/{1.73_m2} (ref >59)
GFR calc non Af Amer: 80 mL/min/{1.73_m2} (ref >59)
Globulin, Total: 2.5 g/dL (ref 1.5–4.5)
Glucose: 122 mg/dL — ABNORMAL HIGH (ref 65–99)
Potassium: 4.9 mmol/L (ref 3.5–5.2)
Sodium: 133 mmol/L — ABNORMAL LOW (ref 134–144)
Total Protein: 7 g/dL (ref 6.0–8.5)

## 2019-02-04 LAB — LIPID PANEL
Chol/HDL Ratio: 2.5 ratio (ref 0.0–4.4)
Cholesterol, Total: 129 mg/dL (ref 100–199)
HDL: 52 mg/dL (ref >39)
LDL Chol Calc (NIH): 61 mg/dL (ref 0–99)
Triglycerides: 79 mg/dL (ref 0–149)
VLDL Cholesterol Cal: 16 mg/dL (ref 5–40)

## 2019-02-13 ENCOUNTER — Other Ambulatory Visit: Payer: Self-pay | Admitting: Family Medicine

## 2019-02-17 ENCOUNTER — Other Ambulatory Visit: Payer: Self-pay

## 2019-02-20 ENCOUNTER — Other Ambulatory Visit: Payer: Self-pay

## 2019-02-20 ENCOUNTER — Encounter: Payer: Self-pay | Admitting: Family Medicine

## 2019-02-20 ENCOUNTER — Ambulatory Visit (INDEPENDENT_AMBULATORY_CARE_PROVIDER_SITE_OTHER): Payer: Medicare Other | Admitting: Family Medicine

## 2019-02-20 VITALS — BP 126/52 | HR 57 | Temp 96.6°F | Resp 20 | Ht 62.0 in | Wt 150.0 lb

## 2019-02-20 DIAGNOSIS — E119 Type 2 diabetes mellitus without complications: Secondary | ICD-10-CM | POA: Diagnosis not present

## 2019-02-20 DIAGNOSIS — E1159 Type 2 diabetes mellitus with other circulatory complications: Secondary | ICD-10-CM

## 2019-02-20 DIAGNOSIS — E871 Hypo-osmolality and hyponatremia: Secondary | ICD-10-CM | POA: Diagnosis not present

## 2019-02-20 DIAGNOSIS — I10 Essential (primary) hypertension: Secondary | ICD-10-CM

## 2019-02-20 DIAGNOSIS — F339 Major depressive disorder, recurrent, unspecified: Secondary | ICD-10-CM

## 2019-02-20 DIAGNOSIS — E785 Hyperlipidemia, unspecified: Secondary | ICD-10-CM

## 2019-02-20 DIAGNOSIS — J410 Simple chronic bronchitis: Secondary | ICD-10-CM | POA: Diagnosis not present

## 2019-02-20 DIAGNOSIS — E1169 Type 2 diabetes mellitus with other specified complication: Secondary | ICD-10-CM | POA: Insufficient documentation

## 2019-02-20 DIAGNOSIS — I152 Hypertension secondary to endocrine disorders: Secondary | ICD-10-CM | POA: Insufficient documentation

## 2019-02-20 DIAGNOSIS — L989 Disorder of the skin and subcutaneous tissue, unspecified: Secondary | ICD-10-CM

## 2019-02-20 MED ORDER — TRIAMCINOLONE ACETONIDE 0.1 % EX CREA: 1.0000 "application " | TOPICAL_CREAM | Freq: Two times a day (BID) | CUTANEOUS | 0 refills | Status: DC

## 2019-02-20 NOTE — Patient Instructions (Addendum)
Continue to monitor your blood sugars as we discussed and record them. Bring the log to your next appointment.  Take your medications as directed.    Goal Blood glucose:    Fasting (before meals) = 80 to 130   Within 2 hours of eating = less than 180   Understanding your Hemoglobin A1c:     Diabetes Mellitus and Nutrition    I think that you would greatly benefit from seeing a nutritionist. If this is something you are interested in, please call Dr Sykes at [phone] to schedule an appointment.   When you have diabetes (diabetes mellitus), it is very important to have healthy eating habits because your blood sugar (glucose) levels are greatly affected by what you eat and drink. Eating healthy foods in the appropriate amounts, at about the same times every day, can help you:  Control your blood glucose.  Lower your risk of heart disease.  Improve your blood pressure.  Reach or maintain a healthy weight.  Every person with diabetes is different, and each person has different needs for a meal plan. Your health care provider may recommend that you work with a diet and nutrition specialist (dietitian) to make a meal plan that is best for you. Your meal plan may vary depending on factors such as:  The calories you need.  The medicines you take.  Your weight.  Your blood glucose, blood pressure, and cholesterol levels.  Your activity level.  Other health conditions you have, such as heart or kidney disease.  How do carbohydrates affect me? Carbohydrates affect your blood glucose level more than any other type of food. Eating carbohydrates naturally increases the amount of glucose in your blood. Carbohydrate counting is a method for keeping track of how many carbohydrates you eat. Counting carbohydrates is important to keep your blood glucose at a healthy level, especially if you use insulin or take certain oral diabetes medicines. It is important to know how many  carbohydrates you can safely have in each meal. This is different for every person. Your dietitian can help you calculate how many carbohydrates you should have at each meal and for snack. Foods that contain carbohydrates include:  Bread, cereal, rice, pasta, and crackers.  Potatoes and corn.  Peas, beans, and lentils.  Milk and yogurt.  Fruit and juice.  Desserts, such as cakes, cookies, ice cream, and candy.  How does alcohol affect me? Alcohol can cause a sudden decrease in blood glucose (hypoglycemia), especially if you use insulin or take certain oral diabetes medicines. Hypoglycemia can be a life-threatening condition. Symptoms of hypoglycemia (sleepiness, dizziness, and confusion) are similar to symptoms of having too much alcohol. If your health care provider says that alcohol is safe for you, follow these guidelines:  Limit alcohol intake to no more than 1 drink per day for nonpregnant women and 2 drinks per day for men. One drink equals 12 oz of beer, 5 oz of wine, or 1 oz of hard liquor.  Do not drink on an empty stomach.  Keep yourself hydrated with water, diet soda, or unsweetened iced tea.  Keep in mind that regular soda, juice, and other mixers may contain a lot of sugar and must be counted as carbohydrates.  What are tips for following this plan?  Reading food labels  Start by checking the serving size on the label. The amount of calories, carbohydrates, fats, and other nutrients listed on the label are based on one serving of the food. Many foods   contain more than one serving per package.  Check the total grams (g) of carbohydrates in one serving. You can calculate the number of servings of carbohydrates in one serving by dividing the total carbohydrates by 15. For example, if a food has 30 g of total carbohydrates, it would be equal to 2 servings of carbohydrates.  Check the number of grams (g) of saturated and trans fats in one serving. Choose foods that have  low or no amount of these fats.  Check the number of milligrams (mg) of sodium in one serving. Most people should limit total sodium intake to less than 2,300 mg per day.  Always check the nutrition information of foods labeled as "low-fat" or "nonfat". These foods may be higher in added sugar or refined carbohydrates and should be avoided.  Talk to your dietitian to identify your daily goals for nutrients listed on the label.  Shopping  Avoid buying canned, premade, or processed foods. These foods tend to be high in fat, sodium, and added sugar.  Shop around the outside edge of the grocery store. This includes fresh fruits and vegetables, bulk grains, fresh meats, and fresh dairy.  Cooking  Use low-heat cooking methods, such as baking, instead of high-heat cooking methods like deep frying.  Cook using healthy oils, such as olive, canola, or sunflower oil.  Avoid cooking with butter, cream, or high-fat meats.  Meal planning  Eat meals and snacks regularly, preferably at the same times every day. Avoid going long periods of time without eating.  Eat foods high in fiber, such as fresh fruits, vegetables, beans, and whole grains. Talk to your dietitian about how many servings of carbohydrates you can eat at each meal.  Eat 4-6 ounces of lean protein each day, such as lean meat, chicken, fish, eggs, or tofu. 1 ounce is equal to 1 ounce of meat, chicken, or fish, 1 egg, or 1/4 cup of tofu.  Eat some foods each day that contain healthy fats, such as avocado, nuts, seeds, and fish.  Lifestyle   Check your blood glucose regularly.  Exercise at least 30 minutes 5 or more days each week, or as told by your health care provider.  Take medicines as told by your health care provider.  Do not use any products that contain nicotine or tobacco, such as cigarettes and e-cigarettes. If you need help quitting, ask your health care provider.  Work with a counselor or diabetes educator to  identify strategies to manage stress and any emotional and social challenges.   What are some questions to ask my health care provider?  Do I need to meet with a diabetes educator?  Do I need to meet with a dietitian?  What number can I call if I have questions?  When are the best times to check my blood glucose?   Where to find more information:  American Diabetes Association: diabetes.org/food-and-fitness/food  Academy of Nutrition and Dietetics: www.eatright.org/resources/health/diseases-and-conditions/diabetes  National Institute of Diabetes and Digestive and Kidney Diseases (NIH): www.niddk.nih.gov/health-information/diabetes/overview/diet-eating-physical-activity   Summary  A healthy meal plan will help you control your blood glucose and maintain a healthy lifestyle.  Working with a diet and nutrition specialist (dietitian) can help you make a meal plan that is best for you.  Keep in mind that carbohydrates and alcohol have immediate effects on your blood glucose levels. It is important to count carbohydrates and to use alcohol carefully. This information is not intended to replace advice given to you by your health care provider.   Make sure you discuss any questions you have with your health care provider. Document Released: 12/25/2004 Document Revised: 05/04/2016 Document Reviewed: 05/04/2016 Elsevier Interactive Patient Education  [employer].      It was a pleasure seeing you today, Research scientist (medical).  Information regarding what we discussed is included in this packet.  Please make an appointment to see me in 3-4 months.   In a few days you may receive a survey in the mail or online from [employer] regarding your visit with [place] today. Please take a moment to fill this out. Your feedback is very important to our office. It can help [place] better understand your needs as well as improve your experience and satisfaction. Thank you for taking your time to complete it. We care  about you.  Because of recent events of COVID-19 ("Coronavirus"), please follow CDC recommendations:   1. Wash your hand frequently 2. Avoid touching your face 3. Stay away from people who are sick 4. If you have symptoms such as fever, cough, shortness of breath then call your healthcare provider for further guidance 5. If you are sick, STAY AT HOME, unless otherwise directed by your healthcare provider. 6. Follow directions from state and national officials regarding staying safe    Please feel free to call our office if any questions or concerns arise.  Warm Regards, Monia Pouch, FNP-C [facility] [address] [place], [place] [address] [phone]

## 2019-02-20 NOTE — Progress Notes (Signed)
Subjective:  Patient ID: [name] [name], female    DOB: 11/24/1931, 85 y.o.   MRN: [id number]  Patient Care Team: Baruch Gouty, FNP as PCP - General (Family Medicine) Newt Minion, MD (Orthopedic Surgery) Clent Jacks, MD (Ophthalmology)   Chief Complaint:  Medical Management of Chronic Issues (3 mo ), Diabetes, Hyperlipidemia, and Hypertension   HPI: [name] [name] is a 85 y.o. female presenting on 02/20/2019 for Medical Management of Chronic Issues (3 mo ), Diabetes, Hyperlipidemia, and Hypertension  1. Type 2 diabetes mellitus without complication, without long-term current use of insulin (HCC) Doing well with current regimen. Checks blood sugars twice daily averaging around 100-110. No hypoglycemia or hyperglycemic episodes. No polyuria, polydipsia, or polyphagia.   2. Hypertension associated with diabetes ([place]) BP well controlled. No chest pain, palpitations, dizziness, headaches, or syncope. Does have intermittent SHOB due to COPD. Minimal lower extremity swelling at times. Compliant with medications without associated side effects.   3. Hyperlipidemia associated with type 2 diabetes mellitus ([place]) On statin therapy and tolerates very well. Does stay active. Lives alone and is able to clean and cook. Does watch what she eats and eats healthy.   4. Simple chronic bronchitis ([place]) Well controlled with current medications. Does have intermittent exertional shortness of breath. Denies increased shortness of breath, fatigue, increased sputum production, or worsening cough.   5. Hyponatremia Last Na 133. Denies dizziness, weakness, nausea, or confusion. No changes in urinary output.   6. Recurrent depression ([place]) Doing well on current medications without associated side effects. No SI or HI.  Depression screen [facility] 2/9 02/20/2019 12/15/2018 12/12/2018 09/29/2018 05/05/2018  Decreased Interest 0 0 0 3 0  Down, Depressed, Hopeless 0 0 0 1 0  PHQ - 2 Score 0 0 0 4 0  Altered  sleeping - - - 0 -  Tired, decreased energy - - - 3 -  Change in appetite - - - 0 -  Feeling bad or failure about yourself  - - - 0 -  Trouble concentrating - - - 0 -  Moving slowly or fidgety/restless - - - 0 -  Suicidal thoughts - - - 0 -  PHQ-9 Score - - - 7 -  Difficult doing work/chores - - - - -  Some recent data might be hidden        Relevant past medical, surgical, family, and social history reviewed and updated as indicated.  Allergies and medications reviewed and updated. Date reviewed: Chart in Epic.   Past Medical History:  Diagnosis Date  . Anxiety   . Arthritis   . Atrophic vaginitis   . Cataract   . COPD (chronic obstructive pulmonary disease) ([place])   . Depressive disorder, not elsewhere classified   . Diabetes mellitus without complication ([place])   . Disorder of bone and cartilage, unspecified   . Elevated blood sugar   . Emphysema lung ([place])   . Hypertension   . Other and unspecified hyperlipidemia   . Postmenopausal   . Rhinitis     Past Surgical History:  Procedure Laterality Date  . APPENDECTOMY  1957  . CATARACT EXTRACTION W/ INTRAOCULAR LENS IMPLANT Bilateral 2005   [place]  . COLONOSCOPY N/A 03/08/2014   Procedure: COLONOSCOPY;  Surgeon: Rogene Houston, MD;  [facility];  Service: Endoscopy;  Laterality: N/A;  . EYE SURGERY    . JOINT REPLACEMENT    . TOTAL HIP ARTHROPLASTY Left 12/27/2013   Procedure: LEFT TOTAL  HIP ARTHROPLASTY;  Surgeon: Newt Minion, MD;  Location: [facility];  Service: Orthopedics;  Laterality: Left;    Social History   Socioeconomic History  . Marital status: Widowed    Spouse name: Not on file  . Number of children: 2  . Years of education: 10  . Highest education level: 10th grade  Occupational History  . 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 Financial resource strain: Not hard at all  . Food insecurity    Worry: Never true    Inability: Never true  .  Transportation needs    Medical: No    Non-medical: No  Tobacco Use  . Smoking status: Never Smoker  . Smokeless tobacco: Never Used  Substance and Sexual Activity  . Alcohol use: No    Alcohol/week: 0.0 standard drinks  . Drug use: No  . Sexual activity: Not Currently  Lifestyle  . Physical activity    Days per week: 0 days    Minutes per session: 0 min  . Stress: Only a little  Relationships  . Social connections    Talks on phone: More than three times a week    Gets together: More than three times a week    Attends religious service: More than 4 times per year    Active member of club or organization: Yes    Attends meetings of clubs or organizations: More than 4 times per year    Relationship status: Widowed  . Intimate partner violence    Fear of current or ex partner: No    Emotionally abused: No    Physically abused: No    Forced sexual activity: No  Other Topics Concern  . Not on file  Social History Narrative  . Not on file    Outpatient Encounter Medications as of 02/20/2019  Medication Sig  . albuterol (PROVENTIL) (2.5 MG/3ML) 0.083% nebulizer solution NEBULIZE 1 VIAL EVERY 6 HOURS AS NEEDED FOR SHORTNESS OF BREATH OR WHEEZING  . ALPRAZolam (XANAX) 0.25 MG tablet TAKE 1/2 TABLET FOUR TIMES DAILY AS NEEDED  . amLODipine (NORVASC) 2.5 MG tablet Take 1 tablet (2.5 mg total) by mouth daily.  Marland Kitchen aspirin 81 MG tablet Take 81 mg by mouth every other day.  . blood glucose meter kit and supplies KIT Check BS once daily  . budesonide-formoterol (SYMBICORT) 160-4.5 MCG/ACT inhaler Inhale 2 puffs into the lungs 2 (two) times daily.  . cholecalciferol (VITAMIN D) 1000 UNITS tablet Take 2,000 Units by mouth daily.  Marland Kitchen escitalopram (LEXAPRO) 10 MG tablet Take 1 tablet (10 mg total) by mouth daily. as directed  . fluticasone (FLONASE) 50 MCG/ACT nasal spray USE ONE SPRAY in each nostril ONCE daily  . gabapentin (NEURONTIN) 100 MG capsule TAKE (1) CAPSULE THREE TIMES DAILY.  (Patient taking differently: Take 100 mg by mouth at bedtime. TAKE (1) CAPSULE THREE TIMES DAILY.)  . ketoconazole (NIZORAL) 2 % shampoo Apply 1 application topically 2 (two) times a week.  Elmore Guise Devices (ONE TOUCH DELICA LANCING DEV) MISC Use to check BG up to twice a day. DX: E11.29 - type 2 DM  . lovastatin (MEVACOR) 40 MG tablet Take 1 tablet (40 mg total) by mouth daily.  . metFORMIN (GLUCOPHAGE-XR) 500 MG 24 hr tablet TAKE 1 TABLET ONCE DAILY WITH BREAKFAST  . ONETOUCH VERIO test strip USE ONE STRIP TO CHECK GLUCOSE TWICE DAILY AS NEEDED  . [DISCONTINUED] triamcinolone ointment (KENALOG) 0.5 % Apply topically  2 (two) times daily.  Marland Kitchen triamcinolone cream (KENALOG) 0.1 % Apply 1 application topically 2 (two) times daily.  . [DISCONTINUED] albuterol (PROVENTIL) (2.5 MG/3ML) 0.083% nebulizer solution NEBULIZE 1 VIAL EVERY 6 HOURS AS NEEDED FOR SHORTNESS OF BREATH OR WHEEZING  . [DISCONTINUED] benzonatate (TESSALON PERLES) 100 MG capsule Take 1 capsule (100 mg total) by mouth 3 (three) times daily as needed for cough.  . [DISCONTINUED] predniSONE (DELTASONE) 20 MG tablet 2 po at sametime daily for 5 days   No facility-administered encounter medications on file as of 02/20/2019.     Allergies  Allergen Reactions  . Lisinopril Cough  . Chlordiazepoxide-Clidinium     Can't remember  . Clarithromycin     Can't remember  . Erymax [Erythromycin]     Can't remember  . Penicillins     Can't remember  . Betadine [Povidone Iodine] Rash  . Evista [Raloxifene Hydrochloride]     Numb feeling  . Hydrogen Peroxide Rash  . Sertraline Hcl Other (See Comments)    Sweating and mouth irritation   . Sulfa Antibiotics Nausea And Vomiting    Review of Systems  Constitutional: Negative for activity change, appetite change, chills, diaphoresis, fatigue, fever and unexpected weight change.  HENT: Positive for rhinorrhea. Negative for congestion, postnasal drip, sinus pressure, sinus pain, sneezing,  sore throat and tinnitus.   Eyes: Negative.  Negative for photophobia and visual disturbance.  Respiratory: Positive for cough (intermittent) and shortness of breath (intermittent, exertional). Negative for chest tightness and wheezing.   Cardiovascular: Negative for chest pain, palpitations and leg swelling.  Gastrointestinal: Negative for abdominal pain, blood in stool, constipation, diarrhea, nausea and vomiting.  Endocrine: Negative.  Negative for cold intolerance, heat intolerance, polydipsia, polyphagia and polyuria.  Genitourinary: Negative for decreased urine volume, difficulty urinating, dysuria, frequency and urgency.  Musculoskeletal: Positive for arthralgias (chronic), back pain (chronic) and gait problem (slow, unsteady, uses cane). Negative for myalgias.  Skin: Negative.   Allergic/Immunologic: Negative.   Neurological: Negative for dizziness, tremors, seizures, syncope, facial asymmetry, speech difficulty, weakness, light-headedness, numbness and headaches.  Hematological: Negative.   Psychiatric/Behavioral: Negative for confusion, hallucinations, sleep disturbance and suicidal ideas. The patient is not nervous/anxious.   All other systems reviewed and are negative.       Objective:  BP (!) 126/52   Pulse (!) 57   Temp (!) 96.6 F (35.9 C)   Resp 20   Ht 5' 2"  (1.575 m)   Wt 150 lb (68 kg)   SpO2 98%   BMI 27.44 kg/m    Wt Readings from Last 3 Encounters:  02/20/19 150 lb (68 kg)  12/15/18 149 lb (67.6 kg)  09/29/18 147 lb 6.4 oz (66.9 kg)    Physical Exam Vitals signs and nursing note reviewed.  Constitutional:      General: She is not in acute distress.    Appearance: Normal appearance. She is well-developed, well-groomed and overweight. She is not ill-appearing, toxic-appearing or diaphoretic.  HENT:     Head: Normocephalic and atraumatic.     Jaw: There is normal jaw occlusion.     Right Ear: Hearing, tympanic membrane, ear canal and external ear normal.      Left Ear: Hearing, tympanic membrane, ear canal and external ear normal.     Nose:     Right Turbinates: Enlarged.     Left Turbinates: Enlarged.     Mouth/Throat:     Lips: Pink.     Mouth: Mucous membranes are moist.  Pharynx: Oropharynx is clear. Uvula midline.  Eyes:     General: Lids are normal.     Extraocular Movements: Extraocular movements intact.     Conjunctiva/sclera: Conjunctivae normal.     Pupils: Pupils are equal, round, and reactive to light.  Neck:     Musculoskeletal: Normal range of motion and neck supple.     Thyroid: No thyroid mass, thyromegaly or thyroid tenderness.     Vascular: No carotid bruit or JVD.     Trachea: Trachea and phonation normal.  Cardiovascular:     Rate and Rhythm: Normal rate and regular rhythm.     Chest Wall: PMI is not displaced.     Pulses: Normal pulses.     Heart sounds: Normal heart sounds. No murmur. No friction rub. No gallop.   Pulmonary:     Effort: Pulmonary effort is normal. No accessory muscle usage or respiratory distress.     Breath sounds: Decreased air movement present. No decreased breath sounds, wheezing, rhonchi or rales.  Abdominal:     General: Bowel sounds are normal. There is no distension or abdominal bruit.     Palpations: Abdomen is soft. There is no hepatomegaly or splenomegaly.     Tenderness: There is no abdominal tenderness. There is no right CVA tenderness or left CVA tenderness.     Hernia: No hernia is present.  Musculoskeletal:     Right hip: Normal.     Left hip: Normal.     Cervical back: Normal.     Thoracic back: She exhibits decreased range of motion and tenderness. She exhibits no bony tenderness, no swelling, no edema, no deformity, no laceration, no pain, no spasm and normal pulse.     Lumbar back: She exhibits decreased range of motion and tenderness. She exhibits no bony tenderness, no swelling, no edema, no deformity, no laceration, no pain, no spasm and normal pulse.     Right  lower leg: No edema.     Left lower leg: No edema.  Lymphadenopathy:     Cervical: No cervical adenopathy.  Skin:    General: Skin is warm and dry.     Capillary Refill: Capillary refill takes less than 2 seconds.     Coloration: Skin is not cyanotic, jaundiced or pale.     Findings: Lesion present. No rash.       Neurological:     General: No focal deficit present.     Mental Status: She is alert and oriented to person, place, and time.     Cranial Nerves: Cranial nerves are intact.     Sensory: Sensation is intact.     Motor: Motor function is intact.     Coordination: Coordination is intact.     Gait: Gait abnormal (slow, unsteady, antalgic, uses cane).     Deep Tendon Reflexes: Reflexes are normal and symmetric.  Psychiatric:        Attention and Perception: Attention and perception normal.        Mood and Affect: Mood and affect normal.        Speech: Speech normal.        Behavior: Behavior normal. Behavior is cooperative.        Thought Content: Thought content normal.        Cognition and Memory: Cognition and memory normal.        Judgment: Judgment normal.     Results for orders placed or performed in visit on 02/03/19  CBC with Differential/Platelet  Result  Value Ref Range   WBC 7.1 3.4 - 10.8 x10E3/uL   RBC 4.83 3.77 - 5.28 x10E6/uL   Hemoglobin 14.7 11.1 - 15.9 g/dL   Hematocrit 43.4 34.0 - 46.6 %   MCV 90 79 - 97 fL   MCH 30.4 26.6 - 33.0 pg   MCHC 33.9 31.5 - 35.7 g/dL   RDW 12.8 11.7 - 15.4 %   Platelets 324 150 - 450 x10E3/uL   Neutrophils 60 Not Estab. %   Lymphs 33 Not Estab. %   Monocytes 6 Not Estab. %   Eos 1 Not Estab. %   Basos 0 Not Estab. %   Neutrophils Absolute 4.2 1.4 - 7.0 x10E3/uL   Lymphocytes Absolute 2.3 0.7 - 3.1 x10E3/uL   Monocytes Absolute 0.4 0.1 - 0.9 x10E3/uL   EOS (ABSOLUTE) 0.1 0.0 - 0.4 x10E3/uL   Basophils Absolute 0.0 0.0 - 0.2 x10E3/uL   Immature Granulocytes 0 Not Estab. %   Immature Grans (Abs) 0.0 0.0 - 0.1  x10E3/uL  CMP14+EGFR  Result Value Ref Range   Glucose 122 (H) 65 - 99 mg/dL   BUN 9 8 - 27 mg/dL   Creatinine, Ser 0.69 0.57 - 1.00 mg/dL   GFR calc non Af Amer 80 >59 mL/min/1.73   GFR calc Af Amer 92 >59 mL/min/1.73   BUN/Creatinine Ratio 13 12 - 28   Sodium 133 (L) 134 - 144 mmol/L   Potassium 4.9 3.5 - 5.2 mmol/L   Chloride 97 96 - 106 mmol/L   CO2 21 20 - 29 mmol/L   Calcium 9.6 8.7 - 10.3 mg/dL   Total Protein 7.0 6.0 - 8.5 g/dL   Albumin 4.5 3.6 - 4.6 g/dL   Globulin, Total 2.5 1.5 - 4.5 g/dL   Albumin/Globulin Ratio 1.8 1.2 - 2.2   Bilirubin Total 0.6 0.0 - 1.2 mg/dL   Alkaline Phosphatase 83 39 - 117 IU/L   AST 19 0 - 40 IU/L   ALT 15 0 - 32 IU/L  Lipid panel  Result Value Ref Range   Cholesterol, Total 129 100 - 199 mg/dL   Triglycerides 79 0 - 149 mg/dL   HDL 52 >39 mg/dL   VLDL Cholesterol Cal 16 5 - 40 mg/dL   LDL Chol Calc (NIH) 61 0 - 99 mg/dL   Chol/HDL Ratio 2.5 0.0 - 4.4 ratio  Bayer DCA Hb A1c Waived  Result Value Ref Range   HB A1C (BAYER DCA - WAIVED) 6.3 <7.0 %       Pertinent labs & imaging results that were available during my care of the patient were reviewed by me and considered in my medical decision making.  Assessment & Plan:  Sayge was seen today for medical management of chronic issues, diabetes, hyperlipidemia and hypertension.  Diagnoses and all orders for this visit:  Type 2 diabetes mellitus without complication, without long-term current use of insulin (HCC) A1C 6.3 on 02/03/2019. No changes in therapy. Will check microalbumin today. Keep up the good work. Follow up in 3 months or sooner if needed.  -     Microalbumin / creatinine urine ratio -     BMP8+EGFR  Hypertension associated with diabetes ([place]) Well controlled. DASH diet and exercise discussed. Continue current medications.  -     Microalbumin / creatinine urine ratio  Hyperlipidemia associated with type 2 diabetes mellitus ([place]) Diet and exercise encouraged. Continue  current medications.   Simple chronic bronchitis ([place]) Well controlled, no change in therapy warranted.  Hyponatremia Will recheck today and determine treatment once labs have resulted. Pt asymptomatic.  -     BMP8+EGFR  Skin lesion of neck Hyperkeratotic, pruritis lesion to posterior neck. Pt has been scratching and picking at area. Pt aware to avoid picking and scratching area. Will trial below for two weeks and if lesion does not improve, pt will make a follow up appointment with dermatology, she has seen Dr. Nevada Crane in the past.  -     triamcinolone cream (KENALOG) 0.1 %; Apply 1 application topically 2 (two) times daily.  Depression, recurrent ([place]) Well controlled. Will continue current medications. Report any new or worsening symptoms.     Continue all other maintenance medications.  Follow up plan: Return in about 3 months (around 05/23/2019), or if symptoms worsen or fail to improve, for DM.  Continue healthy lifestyle choices, including diet (rich in fruits, vegetables, and lean proteins, and low in salt and simple carbohydrates) and exercise (at least 30 minutes of moderate physical activity daily).  Educational handout given for DM, survey, COVID-19  The above assessment and management plan was discussed with the patient. The patient verbalized understanding of and has agreed to the management plan. Patient is aware to call the clinic if they develop any new symptoms or if symptoms persist or worsen. Patient is aware when to return to the clinic for a follow-up visit. Patient educated on when it is appropriate to go to the emergency department.   Monia Pouch, FNP-C [facility] [phone]

## 2019-02-21 LAB — BMP8+EGFR
BUN/Creatinine Ratio: 14 (ref 12–28)
BUN: 10 mg/dL (ref 8–27)
CO2: 22 mmol/L (ref 20–29)
Calcium: 9.8 mg/dL (ref 8.7–10.3)
Chloride: 99 mmol/L (ref 96–106)
Creatinine, Ser: 0.74 mg/dL (ref 0.57–1.00)
GFR calc Af Amer: 86 mL/min/{1.73_m2} (ref >59)
GFR calc non Af Amer: 75 mL/min/{1.73_m2} (ref >59)
Glucose: 79 mg/dL (ref 65–99)
Potassium: 5.1 mmol/L (ref 3.5–5.2)
Sodium: 137 mmol/L (ref 134–144)

## 2019-02-21 LAB — MICROALBUMIN / CREATININE URINE RATIO
Creatinine, Urine: 30 mg/dL
Microalb/Creat Ratio: 23 mg/g creat (ref 0–29)
Microalbumin, Urine: 6.9 ug/mL

## 2019-03-02 ENCOUNTER — Telehealth: Payer: Self-pay | Admitting: *Deleted

## 2019-03-02 NOTE — Telephone Encounter (Signed)
Patient aware and verbalizes understanding. 

## 2019-03-02 NOTE — Telephone Encounter (Signed)
If she continues to have cough and SHOB, I would like her to be seen in ED or UC so a CXR can be completed.

## 2019-03-02 NOTE — Telephone Encounter (Signed)
Fax RF from [place] Note from pharmacy states pt continues w/ cough Request RF on Benzonatate cap, Doxyclcline & Prednisone Please advise

## 2019-03-03 DIAGNOSIS — R911 Solitary pulmonary nodule: Secondary | ICD-10-CM | POA: Diagnosis not present

## 2019-03-03 DIAGNOSIS — J449 Chronic obstructive pulmonary disease, unspecified: Secondary | ICD-10-CM | POA: Diagnosis not present

## 2019-03-03 DIAGNOSIS — R05 Cough: Secondary | ICD-10-CM | POA: Diagnosis not present

## 2019-03-03 DIAGNOSIS — R509 Fever, unspecified: Secondary | ICD-10-CM | POA: Diagnosis not present

## 2019-03-06 ENCOUNTER — Other Ambulatory Visit: Payer: Self-pay | Admitting: Family Medicine

## 2019-03-06 ENCOUNTER — Telehealth: Payer: Self-pay | Admitting: Family Medicine

## 2019-03-06 DIAGNOSIS — R9389 Abnormal findings on diagnostic imaging of other specified body structures: Secondary | ICD-10-CM

## 2019-03-06 DIAGNOSIS — J441 Chronic obstructive pulmonary disease with (acute) exacerbation: Secondary | ICD-10-CM

## 2019-03-06 NOTE — Telephone Encounter (Signed)
Patient is scared to have a Ct scan done to COVID- please advise

## 2019-03-06 NOTE — Telephone Encounter (Signed)
Patient aware and verbalized understanding. °

## 2019-03-06 NOTE — Progress Notes (Signed)
a 

## 2019-03-06 NOTE — Telephone Encounter (Signed)
lmtcb

## 2019-03-06 NOTE — Telephone Encounter (Signed)
Patient states that she was seen at urgent care in [facility] Friday.  States she got a chest x ray and it said no pneumonia.  States that they seen a "small nodular density in the right lung and CT scan is recommended."  States that she was given benzonatate and doxy.  Patient is going to mail the Chest x ray results for Selinda Eon to review.

## 2019-03-06 NOTE — Telephone Encounter (Signed)
Will order CT scan for abnormal chest xray. Please let pt know.

## 2019-03-06 NOTE — Telephone Encounter (Signed)
I will review the xray results when I receive them and call her at that time.

## 2019-03-13 ENCOUNTER — Other Ambulatory Visit: Payer: Self-pay | Admitting: *Deleted

## 2019-03-13 MED ORDER — ONETOUCH VERIO VI STRP: ORAL_STRIP | 3 refills | Status: DC

## 2019-03-13 MED ORDER — ONETOUCH VERIO FLEX SYSTEM W/DEVICE KIT: PACK | 0 refills | Status: DC

## 2019-03-13 MED ORDER — ONETOUCH ULTRASOFT LANCETS MISC: 3 refills | Status: DC

## 2019-03-14 ENCOUNTER — Telehealth: Payer: Self-pay | Admitting: Family Medicine

## 2019-03-14 NOTE — Telephone Encounter (Signed)
Pt made aware to call them - we do not have access to their records.

## 2019-03-14 NOTE — Telephone Encounter (Signed)
Pt called = CT will be scheduled and she will be called with appt

## 2019-03-15 ENCOUNTER — Other Ambulatory Visit: Payer: Self-pay | Admitting: Family Medicine

## 2019-03-15 ENCOUNTER — Telehealth: Payer: Self-pay | Admitting: Family Medicine

## 2019-03-15 DIAGNOSIS — F419 Anxiety disorder, unspecified: Secondary | ICD-10-CM

## 2019-03-15 DIAGNOSIS — R911 Solitary pulmonary nodule: Secondary | ICD-10-CM

## 2019-03-15 DIAGNOSIS — R9389 Abnormal findings on diagnostic imaging of other specified body structures: Secondary | ICD-10-CM

## 2019-03-15 NOTE — Telephone Encounter (Signed)
Patient is scheduled   

## 2019-03-15 NOTE — Telephone Encounter (Signed)
Patient is very UPSET wants to reschedule her CT scan she already canceled this morning because of COVID. She was advised this appointment was important and she needed to have it done. Please call patient back ASAP to R/S.

## 2019-03-16 ENCOUNTER — Ambulatory Visit (HOSPITAL_COMMUNITY)
Admission: RE | Admit: 2019-03-16 | Discharge: 2019-03-16 | Disposition: A | Discharge status: Home / Self Care | Payer: Medicare Other | Source: Ambulatory Visit | Attending: Family Medicine | Admitting: Family Medicine

## 2019-03-16 ENCOUNTER — Other Ambulatory Visit: Payer: Self-pay

## 2019-03-16 ENCOUNTER — Ambulatory Visit (HOSPITAL_COMMUNITY): Payer: Medicare Other

## 2019-03-16 ENCOUNTER — Telehealth: Payer: Self-pay | Admitting: Family Medicine

## 2019-03-16 DIAGNOSIS — R079 Chest pain, unspecified: Secondary | ICD-10-CM | POA: Diagnosis not present

## 2019-03-16 DIAGNOSIS — R9389 Abnormal findings on diagnostic imaging of other specified body structures: Secondary | ICD-10-CM

## 2019-03-16 DIAGNOSIS — R918 Other nonspecific abnormal finding of lung field: Secondary | ICD-10-CM | POA: Diagnosis not present

## 2019-03-16 NOTE — Telephone Encounter (Signed)
What is the name of the medication? ALPRAZolam (XANAX) 0.25 MG tablet  Have you contacted your pharmacy to request a refill? Yes had an apt 02/13/2019  Which pharmacy would you like this sent to? [facility]   Patient notified that their request is being sent to the clinical staff for review and that they should receive a call once it is complete. If they do not receive a call within 24 hours they can check with their pharmacy or our office.

## 2019-03-16 NOTE — Telephone Encounter (Signed)
I have never filled this for her. She would have to be seen to discuss continuing this medication.

## 2019-03-16 NOTE — Telephone Encounter (Signed)
Pt had appt 02/20/19 for chronic follow up. Does she ntbs for refill on xanax?

## 2019-03-17 ENCOUNTER — Telehealth: Payer: Self-pay | Admitting: Family Medicine

## 2019-03-17 NOTE — Telephone Encounter (Signed)
I have never filled this medication for her. She was prescribed this by Dr. Laurance Flatten and she has not requested refills when I had visits with her. This has to be an in person visit.

## 2019-03-17 NOTE — Telephone Encounter (Signed)
I spoke with provider and she says to schedule pt in first available slot and if pt feels like she can't make it till her appt for the xanax she can go to the ED. Pt advised of provider feedback and of the office policy/protocol per provider and scheduled with Sharyn Lull 12/9 at 2:05.

## 2019-03-17 NOTE — Telephone Encounter (Signed)
Patient is frantic because she is out of her Xanax.  Took last one yesterday am 12/3.  Does not have anymore left and no refills.  She was seen in October and did not need refill then.

## 2019-03-17 NOTE — Telephone Encounter (Signed)
Pt is upset and says she doesn't have a way to get here. Could she do a televisit for this or does she need to come in the office?

## 2019-03-17 NOTE — Telephone Encounter (Signed)
There are no appointments available for her to see Sharyn Lull today.  I cannot override the schedule and she is requesting to be seen today.  Can you please call and help her with this.

## 2019-03-20 ENCOUNTER — Encounter: Payer: Self-pay | Admitting: Family Medicine

## 2019-03-20 ENCOUNTER — Other Ambulatory Visit: Payer: Self-pay

## 2019-03-20 ENCOUNTER — Ambulatory Visit (INDEPENDENT_AMBULATORY_CARE_PROVIDER_SITE_OTHER): Payer: Medicare Other | Admitting: Family Medicine

## 2019-03-20 VITALS — BP 164/76 | HR 101 | Temp 97.7°F | Ht 62.0 in | Wt 147.8 lb

## 2019-03-20 DIAGNOSIS — F339 Major depressive disorder, recurrent, unspecified: Secondary | ICD-10-CM | POA: Diagnosis not present

## 2019-03-20 DIAGNOSIS — F419 Anxiety disorder, unspecified: Secondary | ICD-10-CM | POA: Diagnosis not present

## 2019-03-20 DIAGNOSIS — F418 Other specified anxiety disorders: Secondary | ICD-10-CM

## 2019-03-20 MED ORDER — CLONIDINE HCL 0.1 MG PO TABS: 0.1000 mg | ORAL_TABLET | Freq: Every evening | ORAL | 0 refills | Status: DC | PRN

## 2019-03-20 MED ORDER — ESCITALOPRAM OXALATE 20 MG PO TABS: 20.0000 mg | ORAL_TABLET | Freq: Every day | ORAL | 0 refills | Status: AC

## 2019-03-20 MED ORDER — LOVASTATIN 40 MG PO TABS: 40.0000 mg | ORAL_TABLET | Freq: Every day | ORAL | 0 refills | Status: AC

## 2019-03-20 MED ORDER — METFORMIN HCL ER 500 MG PO TB24: ORAL_TABLET | ORAL | 0 refills | Status: AC

## 2019-03-20 MED ORDER — GABAPENTIN 100 MG PO CAPS: ORAL_CAPSULE | ORAL | 0 refills | Status: DC

## 2019-03-20 MED ORDER — BUSPIRONE HCL 10 MG PO TABS: 10.0000 mg | ORAL_TABLET | Freq: Three times a day (TID) | ORAL | 2 refills | Status: DC

## 2019-03-20 MED ORDER — AMLODIPINE BESYLATE 2.5 MG PO TABS: 2.5000 mg | ORAL_TABLET | Freq: Every day | ORAL | 0 refills | Status: DC

## 2019-03-20 MED ORDER — MELATONIN 5 MG PO CAPS: 5.0000 mg | ORAL_CAPSULE | Freq: Every day | ORAL | 0 refills | Status: AC

## 2019-03-20 MED ORDER — ALPRAZOLAM 0.25 MG PO TABS: 0.2500 mg | ORAL_TABLET | Freq: Every evening | ORAL | 0 refills | Status: DC | PRN

## 2019-03-20 NOTE — Progress Notes (Signed)
BP (!) 164/76    Pulse (!) 101    Temp 97.7 F (36.5 C) (Temporal)    Ht _0  (1.575 m)    Wt 147 lb 12.8 oz (67 kg)    SpO2 95%    BMI 27.03 kg/m    Subjective:   Patient ID: [name] [name], female    DOB: Jul 31, 1931, 85 y.o.   MRN: [id number]  HPI: SHAQUIA BERKLEY is a 85 y.o. female presenting on 03/20/2019 for trouble sleeping (x 3 night due to her not having her xanax )   HPI Patient is coming in today discuss trouble sleeping at night.  She has gone from Dr. Laurance Flatten 6 months ago to me being a PCP to Monia Pouch be in the PCP.  She had been on alprazolam and had been discussions about how it was not likely good for her health and how habit-forming and problematic it could be because of her age even with her having taken it previously.  She has been without it for 3 days and she feels like she started to have withdrawal and a lot of anxiety.  We discussed other safer options for anxiety and she is very hesitant to try anything.  She denies any suicidal ideations or thoughts of hurting herself.  She says she has not slept in 3 days  Relevant past medical, surgical, family and social history reviewed and updated as indicated. Interim medical history since our last visit reviewed. Allergies and medications reviewed and updated.  Review of Systems  Constitutional: Negative for chills and fever.  Eyes: Negative for visual disturbance.  Respiratory: Negative for chest tightness and shortness of breath.   Cardiovascular: Negative for chest pain and leg swelling.  Musculoskeletal: Negative for back pain and gait problem.  Skin: Negative for rash.  Neurological: Negative for light-headedness and headaches.  Psychiatric/Behavioral: Positive for decreased concentration, dysphoric mood and sleep disturbance. Negative for agitation, behavioral problems, self-injury and suicidal ideas. The patient is nervous/anxious.   All other systems reviewed and are negative.   Per HPI unless specifically  indicated above   Allergies as of 03/20/2019      Reactions   Lisinopril Cough   Chlordiazepoxide-clidinium    Can't remember   Clarithromycin    Can't remember   Penicillins    Can't remember   Betadine [povidone Iodine] Rash   Erythromycin Itching   Can't remember Can't remember   Evista [raloxifene Hydrochloride]    Numb feeling   Hydrogen Peroxide Rash   Sertraline Hcl Other (See Comments)   Sweating and mouth irritation   Sulfa Antibiotics Nausea And Vomiting      Medication List       Accurate as of March 20, 2019 11:59 PM. If you have any questions, ask your nurse or doctor.        albuterol (2.5 MG/3ML) 0.083% nebulizer solution Commonly known as: PROVENTIL NEBULIZE 1 VIAL EVERY 6 HOURS AS NEEDED FOR SHORTNESS OF BREATH OR WHEEZING   ALPRAZolam 0.25 MG tablet Commonly known as: XANAX Take 1 tablet (0.25 mg total) by mouth at bedtime as needed for anxiety. What changed:   how much to take  how to take this  when to take this  reasons to take this  additional instructions Changed by: Fransisca Kaufmann Timmie Dugue, MD   amLODipine 2.5 MG tablet Commonly known as: NORVASC Take 1 tablet (2.5 mg total) by mouth daily.   aspirin 81 MG tablet Take 81 mg by mouth every  other day.   benzonatate 100 MG capsule Commonly known as: TESSALON Take by mouth.   blood glucose meter kit and supplies Kit Check BS once daily   budesonide-formoterol 160-4.5 MCG/ACT inhaler Commonly known as: SYMBICORT Inhale 2 puffs into the lungs 2 (two) times daily.   busPIRone 10 MG tablet Commonly known as: BUSPAR Take 1 tablet (10 mg total) by mouth 3 (three) times daily. Started by: Worthy Rancher, MD   cholecalciferol 1000 units tablet Commonly known as: VITAMIN D Take 2,000 Units by mouth daily.   cloNIDine 0.1 MG tablet Commonly known as: CATAPRES Take 1 tablet (0.1 mg total) by mouth at bedtime as needed. Started by: Fransisca Kaufmann Bertie Simien, MD   escitalopram 20 MG  tablet Commonly known as: LEXAPRO Take 1 tablet (20 mg total) by mouth daily. as directed What changed:   medication strength  how much to take Changed by: Fransisca Kaufmann Mozell Hardacre, MD   fluticasone 50 MCG/ACT nasal spray Commonly known as: FLONASE USE ONE SPRAY in each nostril ONCE daily   gabapentin 100 MG capsule Commonly known as: NEURONTIN TAKE (1) CAPSULE THREE TIMES DAILY. What changed:   how much to take  how to take this  when to take this   ketoconazole 2 % shampoo Commonly known as: NIZORAL Apply 1 application topically 2 (two) times a week.   lovastatin 40 MG tablet Commonly known as: MEVACOR Take 1 tablet (40 mg total) by mouth daily.   Melatonin 5 MG Caps Take 1 capsule (5 mg total) by mouth at bedtime. Started by: Worthy Rancher, MD   metFORMIN 500 MG 24 hr tablet Commonly known as: GLUCOPHAGE-XR 1 tablet once daily What changed: See the new instructions. Changed by: Fransisca Kaufmann Stark Aguinaga, MD   ONE TOUCH DELICA LANCING DEV Misc Use to check BG up to twice a day. DX: E11.29 - type 2 DM   onetouch ultrasoft lancets CHECK GLUCOSE TWICE DAILY AS NEEDED Dx E11.9   OneTouch Verio Flex System w/Device Kit CHECK GLUCOSE TWICE DAILY AS NEEDED Dx E11.9   OneTouch Verio test strip Generic drug: glucose blood CHECK GLUCOSE TWICE DAILY AS NEEDED Dx E11.9   triamcinolone cream 0.1 % Commonly known as: KENALOG Apply 1 application topically 2 (two) times daily.        Objective:   BP (!) 164/76    Pulse (!) 101    Temp 97.7 F (36.5 C) (Temporal)    Ht _0  (1.575 m)    Wt 147 lb 12.8 oz (67 kg)    SpO2 95%    BMI 27.03 kg/m   Wt Readings from Last 3 Encounters:  03/20/19 147 lb 12.8 oz (67 kg)  02/20/19 150 lb (68 kg)  12/15/18 149 lb (67.6 kg)    Physical Exam Vitals signs and nursing note reviewed.  Constitutional:      General: She is not in acute distress.    Appearance: She is well-developed. She is not diaphoretic.  Eyes:      Conjunctiva/sclera: Conjunctivae normal.  Cardiovascular:     Rate and Rhythm: Normal rate and regular rhythm.     Heart sounds: Normal heart sounds. No murmur.  Pulmonary:     Effort: Pulmonary effort is normal. No respiratory distress.     Breath sounds: Normal breath sounds. No wheezing.  Skin:    General: Skin is warm and dry.     Findings: No rash.  Neurological:     Mental Status: She is alert and  oriented to person, place, and time.     Coordination: Coordination normal.  Psychiatric:        Mood and Affect: Mood is anxious and depressed.        Behavior: Behavior normal.        Thought Content: Thought content does not include suicidal ideation. Thought content does not include suicidal plan.       Assessment & Plan:   Problem List Items Addressed This Visit      Other   Anxiety   Relevant Medications   escitalopram (LEXAPRO) 20 MG tablet   busPIRone (BUSPAR) 10 MG tablet   ALPRAZolam (XANAX) 0.25 MG tablet   Depression with anxiety   Relevant Medications   escitalopram (LEXAPRO) 20 MG tablet   cloNIDine (CATAPRES) 0.1 MG tablet   Melatonin 5 MG CAPS   busPIRone (BUSPAR) 10 MG tablet   ALPRAZolam (XANAX) 0.25 MG tablet   Depression, recurrent (HCC) - Primary   Relevant Medications   escitalopram (LEXAPRO) 20 MG tablet   cloNIDine (CATAPRES) 0.1 MG tablet   Melatonin 5 MG CAPS   busPIRone (BUSPAR) 10 MG tablet   ALPRAZolam (XANAX) 0.25 MG tablet      Gave her 2 weeks of alprazolam for a taper for 1 a day for 2 weeks and will start clonidine for sleep and melatonin for sleep and BuSpar for anxiety and increased Lexapro to 20 mg. Follow up plan: Return if symptoms worsen or fail to improve.  Counseling provided for all of the vaccine components No orders of the defined types were placed in this encounter.   Caryl Pina, MD [facility] 03/21/2019, 4:09 PM

## 2019-03-20 NOTE — Telephone Encounter (Signed)
She is not my patient the daughter nor the mother so I do not see the reason that I would need to get involved, if they would like to speak with our practice manager interim [name] [name] then they can definitely speak with him but I am in agreement with their decision based on the treatment that I have heard.

## 2019-03-20 NOTE — Telephone Encounter (Signed)
[name] [name]'s daughter, Francee Gentile,  stopped by today to speak with me about her mom and wanted to talk with you and apologize.  She was just worried about her mom and her withdrawals from the medicine.  Would you mind giving her a call today.

## 2019-03-22 ENCOUNTER — Other Ambulatory Visit: Payer: Self-pay

## 2019-03-22 ENCOUNTER — Ambulatory Visit: Payer: Medicare Other | Admitting: Family Medicine

## 2019-03-23 NOTE — Telephone Encounter (Signed)
Pt and family aware they can not call staff pertaining to business matters when the office is closed and they can not contact office staff via personal accounts for any matters pertaining to [facility]. Pt and daughter agree to this and are aware if this happens again they will be dismissed.

## 2019-03-27 ENCOUNTER — Other Ambulatory Visit: Payer: Medicare Other

## 2019-04-03 ENCOUNTER — Other Ambulatory Visit: Payer: Self-pay | Admitting: Family Medicine

## 2019-04-03 ENCOUNTER — Other Ambulatory Visit: Payer: Medicare Other

## 2019-04-03 ENCOUNTER — Other Ambulatory Visit: Payer: Self-pay

## 2019-04-03 DIAGNOSIS — R300 Dysuria: Secondary | ICD-10-CM | POA: Diagnosis not present

## 2019-04-03 DIAGNOSIS — N3001 Acute cystitis with hematuria: Secondary | ICD-10-CM

## 2019-04-03 LAB — URINALYSIS, COMPLETE
Bilirubin, UA: NEGATIVE
Glucose, UA: NEGATIVE
Ketones, UA: NEGATIVE
Nitrite, UA: NEGATIVE
Protein,UA: NEGATIVE
Specific Gravity, UA: 1.02 (ref 1.005–1.030)
Urobilinogen, Ur: 0.2 mg/dL (ref 0.2–1.0)
pH, UA: 8.5 — ABNORMAL HIGH (ref 5.0–7.5)

## 2019-04-03 LAB — MICROSCOPIC EXAMINATION: WBC, UA: >30 /hpf — AB (ref 0–5)

## 2019-04-05 LAB — URINE CULTURE

## 2019-04-05 MED ORDER — CEPHALEXIN 500 MG PO CAPS: 500.0000 mg | ORAL_CAPSULE | Freq: Three times a day (TID) | ORAL | 0 refills | Status: AC

## 2019-04-05 NOTE — Progress Notes (Signed)
Urine culture revealed proteus mirabilis. Will treat with Keflex. Take full course of antibiotics. RX sent to pharmacy.

## 2019-04-10 ENCOUNTER — Encounter (HOSPITAL_COMMUNITY): Payer: Self-pay

## 2019-04-10 ENCOUNTER — Emergency Department (HOSPITAL_COMMUNITY)
Admission: EM | Admit: 2019-04-10 | Discharge: 2019-04-10 | Disposition: A | Discharge status: Home / Self Care | Payer: Medicare Other | Attending: Emergency Medicine | Admitting: Emergency Medicine

## 2019-04-10 ENCOUNTER — Other Ambulatory Visit: Payer: Self-pay

## 2019-04-10 ENCOUNTER — Telehealth: Payer: Self-pay | Admitting: Family Medicine

## 2019-04-10 DIAGNOSIS — E119 Type 2 diabetes mellitus without complications: Secondary | ICD-10-CM | POA: Diagnosis not present

## 2019-04-10 DIAGNOSIS — Z7982 Long term (current) use of aspirin: Secondary | ICD-10-CM | POA: Diagnosis not present

## 2019-04-10 DIAGNOSIS — J449 Chronic obstructive pulmonary disease, unspecified: Secondary | ICD-10-CM | POA: Insufficient documentation

## 2019-04-10 DIAGNOSIS — I10 Essential (primary) hypertension: Secondary | ICD-10-CM | POA: Insufficient documentation

## 2019-04-10 DIAGNOSIS — Z96652 Presence of left artificial knee joint: Secondary | ICD-10-CM | POA: Diagnosis not present

## 2019-04-10 DIAGNOSIS — Z760 Encounter for issue of repeat prescription: Secondary | ICD-10-CM | POA: Diagnosis not present

## 2019-04-10 DIAGNOSIS — G479 Sleep disorder, unspecified: Secondary | ICD-10-CM

## 2019-04-10 DIAGNOSIS — Z79899 Other long term (current) drug therapy: Secondary | ICD-10-CM | POA: Diagnosis not present

## 2019-04-10 DIAGNOSIS — Z7984 Long term (current) use of oral hypoglycemic drugs: Secondary | ICD-10-CM | POA: Diagnosis not present

## 2019-04-10 MED ORDER — ALPRAZOLAM 0.25 MG PO TABS: 0.2500 mg | ORAL_TABLET | Freq: Every evening | ORAL | 0 refills | Status: DC | PRN

## 2019-04-10 NOTE — ED Triage Notes (Signed)
Pt reports nasal and sinus congestion x 2 or 3 days.  Reports nausea, no vomiting.  Denies cough, fever, or sob.  Pt says she has been out of her xanax x3 days and has been sick and unable to sleep.  Pt says her doctor wouldn't fill her medication and pt says she doesn't know why.

## 2019-04-10 NOTE — ED Provider Notes (Signed)
[facility] EMERGENCY DEPARTMENT Provider Note   CSN: [id number] Arrival date & time: 04/10/19  0957     History Chief Complaint  Patient presents with  . Nasal Congestion  . Medication Refill    [name] [name] is a 85 y.o. female with PMHx anxiety, COPD, depression, diabetes, HTN who presents to the ED today for medication refill.  She reports that she was recently taken off of her Xanax 0.25 mg twice daily for which she has been on "30 years."  Patient reports that her PCP Dr. Laurance Flatten recently retired in June and she has been out of her medications beginning of the December.  She states that her new PCP took her off of this and she does not know why.  She reports that she has been unable to sleep and has been "sick as a dog" due to this.  Per chart review patient was seen by Dr. Warrick Parisian on 12/07 for issues with sleep.  She had been out of her Xanax for approximately 4 days at that point and had not slept since then.  There was concern for habit-forming medication as well as prescribing Xanax given patient's age.  She was given a 2-week Xanax taper with instructions to take 1 a day to sleep.  Melatonin, clonidine, BuSpar were added to her medications and her Lexapro was increased from 10 mg to 20 mg.  Patient states that she has been out of her Xanax for the past week and has been unable to sleep.  She does report going to the office last Monday for lab work as well as urinalysis but was unable to have lab work done at that time as her PCP Dr. Warrick Parisian was not in the office.  She did have a urinalysis done which showed concern for UTI and patient was prescribed 500 mg cephalexin 3 times daily.  Patient is upset because she cannot get through to see her PCP as she reports that staff at the clinic were very rude to her.  Reports that she called today to try to get an appointment but had mentioned that she had some nasal congestion as she suffers from seasonal allergies and they told her that if she  had any symptoms concerning for Covid that she could not come to the office. Pt is coming here today requesting Xanax. No SI, HI, or AVH.   The history is provided by the patient and medical records.       Past Medical History:  Diagnosis Date  . Anxiety   . Arthritis   . Atrophic vaginitis   . Cataract   . COPD (chronic obstructive pulmonary disease) ([place])   . Depressive disorder, not elsewhere classified   . Diabetes mellitus without complication ([place])   . Disorder of bone and cartilage, unspecified   . Elevated blood sugar   . Emphysema lung ([place])   . Hypertension   . Other and unspecified hyperlipidemia   . Postmenopausal   . Rhinitis     Patient Active Problem List   Diagnosis Date Noted  . Hypertension associated with diabetes ([place]) 02/20/2019  . Hyperlipidemia associated with type 2 diabetes mellitus ([place]) 02/20/2019  . Depression, recurrent ([place]) 02/20/2019  . Type 2 diabetes mellitus without complication, without long-term current use of insulin ([place]) 09/26/2015  . COPD (chronic obstructive pulmonary disease) ([place]) 05/29/2015  . Dermatographia 05/16/2015  . Rectal bleeding 03/07/2014  . History of total left hip arthroplasty 01/25/2014  . Edema 01/02/2014  . Hypoxia 01/02/2014  .  S/P total hip arthroplasty 12/27/2013  . Anxiety   . Depression with anxiety   . Rhinitis   . Atrophic vaginitis   . Postmenopausal     Past Surgical History:  Procedure Laterality Date  . APPENDECTOMY  1957  . CATARACT EXTRACTION W/ INTRAOCULAR LENS IMPLANT Bilateral 2005   [place]  . COLONOSCOPY N/A 03/08/2014   Procedure: COLONOSCOPY;  Surgeon: Rogene Houston, MD;  Location: [facility];  Service: Endoscopy;  Laterality: N/A;  . EYE SURGERY    . JOINT REPLACEMENT    . TOTAL HIP ARTHROPLASTY Left 12/27/2013   Procedure: LEFT TOTAL HIP ARTHROPLASTY;  Surgeon: Newt Minion, MD;  Location: [place];  Service: Orthopedics;  Laterality: Left;     OB History   No obstetric  history on file.     Family History  Problem Relation Age of Onset  . Arthritis Mother   . COPD Father   . Diabetes Father   . Aneurysm Sister   . Diabetes Sister   . Diabetes Sister   . Hypertension Sister   . COPD Daughter   . Fibromyalgia Daughter   . Healthy Daughter     Social History   Tobacco Use  . Smoking status: Never Smoker  . Smokeless tobacco: Never Used  Substance Use Topics  . Alcohol use: No    Alcohol/week: 0.0 standard drinks  . Drug use: No    Home Medications Prior to Admission medications   Medication Sig Start Date End Date Taking? Authorizing Provider  albuterol (PROVENTIL) (2.5 MG/3ML) 0.083% nebulizer solution NEBULIZE 1 VIAL EVERY 6 HOURS AS NEEDED FOR SHORTNESS OF BREATH OR WHEEZING 09/19/18   Hassell Done, Mary-Margaret, FNP  ALPRAZolam Duanne Moron) 0.25 MG tablet Take 1 tablet (0.25 mg total) by mouth at bedtime as needed for up to 14 days for sleep. 04/10/19 04/24/19  Alroy Bailiff, Reg Bircher, PA-C  amLODipine (NORVASC) 2.5 MG tablet Take 1 tablet (2.5 mg total) by mouth daily. 03/20/19   Dettinger, Fransisca Kaufmann, MD  aspirin 81 MG tablet Take 81 mg by mouth every other day.    [provider]  benzonatate (TESSALON) 100 MG capsule Take by mouth. 03/03/19 03/02/20  [provider]  blood glucose meter kit and supplies KIT Check BS once daily 12/12/15   Chipper Herb, MD  Blood Glucose Monitoring Suppl ([place]) w/Device KIT CHECK GLUCOSE TWICE DAILY AS NEEDED Dx E11.9 03/13/19   Baruch Gouty, FNP  budesonide-formoterol (SYMBICORT) 160-4.5 MCG/ACT inhaler Inhale 2 puffs into the lungs 2 (two) times daily. 10/05/17   Dettinger, Fransisca Kaufmann, MD  busPIRone (BUSPAR) 10 MG tablet Take 1 tablet (10 mg total) by mouth 3 (three) times daily. 03/20/19   Dettinger, Fransisca Kaufmann, MD  cephALEXin (KEFLEX) 500 MG capsule Take 1 capsule (500 mg total) by mouth 3 (three) times daily for 7 days. 04/05/19 04/12/19  Baruch Gouty, FNP  cholecalciferol (VITAMIN  D) 1000 UNITS tablet Take 2,000 Units by mouth daily.    [provider]  cloNIDine (CATAPRES) 0.1 MG tablet Take 1 tablet (0.1 mg total) by mouth at bedtime as needed. 03/20/19   Dettinger, Fransisca Kaufmann, MD  escitalopram (LEXAPRO) 20 MG tablet Take 1 tablet (20 mg total) by mouth daily. as directed 03/20/19   Dettinger, Fransisca Kaufmann, MD  fluticasone Asencion Islam) 50 MCG/ACT nasal spray USE ONE SPRAY in each nostril ONCE daily 03/09/16   Evelina Dun A, FNP  gabapentin (NEURONTIN) 100 MG capsule TAKE (1) CAPSULE THREE TIMES  DAILY. 03/20/19   Dettinger, Fransisca Kaufmann, MD  glucose blood (ONETOUCH VERIO) test strip CHECK GLUCOSE TWICE DAILY AS NEEDED Dx E11.9 03/13/19   Baruch Gouty, FNP  ketoconazole (NIZORAL) 2 % shampoo Apply 1 application topically 2 (two) times a week. 05/26/18   Chevis Pretty, FNP  Lancet Devices (ONE TOUCH DELICA LANCING DEV) MISC Use to check BG up to twice a day. DX: E11.29 - type 2 DM 09/02/16   Chipper Herb, MD  Lancets [facility] ULTRASOFT) lancets CHECK GLUCOSE TWICE DAILY AS NEEDED Dx E11.9 03/13/19   Baruch Gouty, FNP  lovastatin (MEVACOR) 40 MG tablet Take 1 tablet (40 mg total) by mouth daily. 03/20/19   Dettinger, Fransisca Kaufmann, MD  Melatonin 5 MG CAPS Take 1 capsule (5 mg total) by mouth at bedtime. 03/20/19   Dettinger, Fransisca Kaufmann, MD  metFORMIN (GLUCOPHAGE-XR) 500 MG 24 hr tablet 1 tablet once daily 03/20/19   Dettinger, Fransisca Kaufmann, MD  triamcinolone cream (KENALOG) 0.1 % Apply 1 application topically 2 (two) times daily. 02/20/19   Baruch Gouty, FNP    Allergies    Lisinopril, Chlordiazepoxide-clidinium, Clarithromycin, Penicillins, Betadine [povidone iodine], Erythromycin, Evista [raloxifene hydrochloride], Hydrogen peroxide, Sertraline hcl, and Sulfa antibiotics  Review of Systems   Review of Systems  HENT: Positive for congestion.   Psychiatric/Behavioral: Positive for sleep disturbance. Negative for suicidal ideas. The patient is nervous/anxious.   All other  systems reviewed and are negative.   Physical Exam Updated Vital Signs BP (!) 150/70 (BP Location: Left Arm)   Pulse 75   Temp (!) 97 F (36.1 C) (Temporal)   Resp 18   Ht '5\' 2"'$  (1.575 m)   Wt 67 kg   SpO2 94%   BMI 27.02 kg/m   Physical Exam Vitals and nursing note reviewed.  Constitutional:      Appearance: She is not ill-appearing.     Comments: Tearful on exam  HENT:     Head: Normocephalic and atraumatic.  Eyes:     Conjunctiva/sclera: Conjunctivae normal.  Cardiovascular:     Rate and Rhythm: Normal rate and regular rhythm.     Pulses: Normal pulses.  Pulmonary:     Effort: Pulmonary effort is normal.     Breath sounds: Normal breath sounds. No wheezing, rhonchi or rales.  Abdominal:     Palpations: Abdomen is soft.     Tenderness: There is no abdominal tenderness. There is no guarding or rebound.  Musculoskeletal:     Cervical back: Neck supple.  Skin:    General: Skin is warm and dry.  Neurological:     Mental Status: She is alert.     ED Results / Procedures / Treatments   Labs (all labs ordered are listed, but only abnormal results are displayed) Labs Reviewed - No data to display  EKG None  Radiology No results found.  Procedures Procedures (including critical care time)  Medications Ordered in ED Medications - No data to display  ED Course  I have reviewed the triage vital signs and the nursing notes.  Pertinent labs & imaging results that were available during my care of the patient were reviewed by me and considered in my medical decision making (see chart for details).  85 year old female who presents the ED today for medication refill.  Has been out of her Xanax for the past week and has not been unable to sleep.  Patient reports that she feels sick all over due to inability to sleep.  She does endorse some nasal congestion for the past 2 to 3 days and has history of seasonal allergies.  States that her PCP would not see her as they are  concerned that she could have Covid.  She is presenting today requesting med refill of her Xanax 0.25 mg.  Reports she has been on this for the past 30 years and her PCP retired in June; for 6 months worth but recently ran out of beginning of December.  Was prescribed 2 weeks worth by PCP states she has been out for a week.  Patient is tearful on exam.  She states that the staff at her PCPs office were very rude to her and they are not taking her seriously.  VS are stable today.  Patient afebrile without tachycardia or tachypnea.  Lungs are clear to auscultation bilaterally.  She is able to speak in full sentences without difficulty.  No accessory muscle use.  No abdominal tenderness.  She does not appear toxic.  She is complaining of some nasal congestion but has history of seasonal allergies.  I discussed potentially swabbing her for Covid but states she has recently been swabbed and it was negative and she does not go on or see anyone.  Do not feel she needs to be reswabbed at this time.  Will prescribe 2 weeks worth of Xanax 0.25 mg at bedtime but had lengthy discussion with patient regarding back she will need to discuss this with her PCP.  They are the ultimate deciding factor about whether or not to prescribe this to her long-term.  She is in the room with plan at this time stable for discharge home.  ,This note was prepared using Dragon voice recognition software and may include unintentional dictation errors due to the inherent limitations of voice recognition software.     MDM Rules/Calculators/A&P                       Final Clinical Impression(s) / ED Diagnoses Final diagnoses:  Medication refill  Difficulty sleeping    Rx / DC Orders ED Discharge Orders         Ordered    ALPRAZolam (XANAX) 0.25 MG tablet  At bedtime PRN     04/10/19 1332           Discharge Instructions     Please take medication as prescribed for the next 2 weeks Follow up with your PCP regarding your  difficulty sleeping        Eustaquio Maize, PA-C 04/10/19 1356    Wyvonnia Dusky, MD 04/10/19 [phone]

## 2019-04-10 NOTE — ED Notes (Signed)
Pt tearful at time of assessment. Pt states"i can't do anything without my medication, i'm not sure what happened at the doctors office and pharmacy." pt consoled and aware EDP will be in to assess. nad noted.

## 2019-04-10 NOTE — Telephone Encounter (Signed)
Patient needs to speak with the triage nurse to schedule hospital follow up visit.

## 2019-04-10 NOTE — Discharge Instructions (Signed)
Please take medication as prescribed for the next 2 weeks Follow up with your PCP regarding your difficulty sleeping

## 2019-04-11 NOTE — Telephone Encounter (Signed)
Patient has a follow up appointment scheduled. 

## 2019-04-17 ENCOUNTER — Telehealth: Payer: Self-pay | Admitting: Family Medicine

## 2019-04-17 ENCOUNTER — Other Ambulatory Visit: Payer: Self-pay | Admitting: Family Medicine

## 2019-04-17 DIAGNOSIS — F339 Major depressive disorder, recurrent, unspecified: Secondary | ICD-10-CM

## 2019-04-17 DIAGNOSIS — F418 Other specified anxiety disorders: Secondary | ICD-10-CM

## 2019-04-17 NOTE — Telephone Encounter (Signed)
Ok. Thanks!

## 2019-04-17 NOTE — Telephone Encounter (Signed)
[name] from [facility] called stating that they sent Dr Dettinger a refill request for patients Clonidine Rx and says it was denied. Was told by pt that she was under the impression when she called today that Dr Dettinger was going to approve it and send it right over but says it has not been done. Explained to pharmacist that Dr Warrick Parisian has been on vacation but will be back tomorrow and will take a look at this.

## 2019-04-17 NOTE — Telephone Encounter (Signed)
ED notes were reviewed, ED MD reported pt was stable without tachycardia, tachypnea, hypertension, or shortness of breath. Will discuss further at upcoming appointment.

## 2019-04-17 NOTE — Telephone Encounter (Signed)
Pt aware her appt is already scheduled for 04/21/19 and to keep that to discuss medications but she wants to know if she can get a refill on the clonidine 0.1mg ? She only has one left. She says she is going to die without her xanax and it has to be refilled and that when she went to the ED they were "apalled that her doctor took her off of it and if he could he would write her a long term rx for it". Pt was advised she would need to discuss this with the provider at her appt on 04/21/19.

## 2019-04-17 NOTE — Telephone Encounter (Signed)
Spoke with [name] at the pharmacy and advised that we are trying to contact the pt and that no refills will be given till pt is seen in office.

## 2019-04-17 NOTE — Telephone Encounter (Signed)
Attempted to contact patient to let her know it will be discussed at upcoming appointment.  Line busy x2

## 2019-04-20 ENCOUNTER — Other Ambulatory Visit: Payer: Self-pay

## 2019-04-21 ENCOUNTER — Encounter: Payer: Self-pay | Admitting: Family Medicine

## 2019-04-21 ENCOUNTER — Ambulatory Visit (INDEPENDENT_AMBULATORY_CARE_PROVIDER_SITE_OTHER): Payer: Medicare Other | Admitting: Family Medicine

## 2019-04-21 VITALS — BP 153/66 | HR 92 | Temp 97.5°F | Ht 62.0 in | Wt 147.6 lb

## 2019-04-21 DIAGNOSIS — H6191 Disorder of right external ear, unspecified: Secondary | ICD-10-CM

## 2019-04-21 DIAGNOSIS — F418 Other specified anxiety disorders: Secondary | ICD-10-CM

## 2019-04-21 DIAGNOSIS — F339 Major depressive disorder, recurrent, unspecified: Secondary | ICD-10-CM | POA: Diagnosis not present

## 2019-04-21 MED ORDER — BUSPIRONE HCL 15 MG PO TABS: 15.0000 mg | ORAL_TABLET | Freq: Three times a day (TID) | ORAL | 3 refills | Status: AC

## 2019-04-21 MED ORDER — CLONIDINE HCL 0.1 MG PO TABS: 0.1000 mg | ORAL_TABLET | Freq: Every evening | ORAL | 0 refills | Status: DC | PRN

## 2019-04-21 NOTE — Progress Notes (Signed)
Subjective:  Patient ID: [name] [name], female    DOB: 04-Jun-1931, 86 y.o.   MRN: [id number]  Patient Care Team: Baruch Gouty, FNP as PCP - General (Family Medicine) Newt Minion, MD (Orthopedic Surgery) Clent Jacks, MD (Ophthalmology)   Chief Complaint:  Hospitalization Follow-up   HPI: [name] [name] is a 86 y.o. female presenting on 04/21/2019 for Hospitalization Follow-up   [name] [name] is an 86 year old female who presents today for follow-up after a visit to the emergency department.  Patient was seen in the emergency department after being tapered off of her Xanax.  Patient was a former patient of Dr. Laurance Flatten.  After Dr. Laurance Flatten retired, was seen by Dr. Warrick Parisian for several visits.  Patient was not continued on Xanax at this time.  I saw patient in office on February 20, 2019.  No mention of Xanax at this visit.  Patient was on Lexapro at that time for management of her anxiety and depression.  Patient called office on March 16, 2019 pertaining to having the Xanax refilled.  Patient was made aware of the controlled substance policy at [facility].  Patient was upset with this as she states she was unaware of this policy.  An appointment was made for patient to see Dr. Warrick Parisian on March 20, 2019.  During this visit Dr. Warrick Parisian had a long discussion with her about the adverse effects of Xanax, potential for abuse, and tapering off of the medication.  Patient's Lexapro was increased at this time to 20 mg/day, she was provided clonidine and BuSpar to help with possible withdrawal symptoms.  She was provided adequate education on proper tapering of Xanax.  Patient presented to the ED on 1228 for refill on the Xanax and a 2-week prescription was provided.  Patient did not exhibit any acute symptoms of withdrawal during his ED visit.  Patient is unhappy that she is no longer receiving Xanax.  Patient reports she has to have the Xanax and does not know why she can no  longer continue the medication.  Patient is alert and able to answer questions, no tachycardia or tachypnea, no agitation or tremors.  Caregiver is with patient today.  Patient is able to ambulate and uses a cane for ambulation.  Patient does seem upset.  She is not tearful.   Relevant past medical, surgical, family, and social history reviewed and updated as indicated.  Allergies and medications reviewed and updated. Date reviewed: Chart in Epic.  Past Medical History:  Diagnosis Date  . Anxiety   . Arthritis   . Atrophic vaginitis   . Cataract   . COPD (chronic obstructive pulmonary disease) ([place])   . Depressive disorder, not elsewhere classified   . Diabetes mellitus without complication ([place])   . Disorder of bone and cartilage, unspecified   . Elevated blood sugar   . Emphysema lung ([place])   . Hypertension   . Other and unspecified hyperlipidemia   . Postmenopausal   . Rhinitis     Past Surgical History:  Procedure Laterality Date  . APPENDECTOMY  1957  . CATARACT EXTRACTION W/ INTRAOCULAR LENS IMPLANT Bilateral 2005   [place]  . COLONOSCOPY N/A 03/08/2014   Procedure: COLONOSCOPY;  Surgeon: Rogene Houston, MD;  Location: [facility];  Service: Endoscopy;  Laterality: N/A;  . EYE SURGERY    . JOINT REPLACEMENT    . TOTAL HIP ARTHROPLASTY Left 12/27/2013   Procedure: LEFT TOTAL HIP ARTHROPLASTY;  Surgeon: Beverely Low  Fernanda Drum, MD;  Location: [facility];  Service: Orthopedics;  Laterality: Left;    Social History   Socioeconomic History  . Marital status: Widowed    Spouse name: Not on file  . Number of children: 2  . Years of education: 10  . Highest education level: 10th grade  Occupational History  . 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 Smoking status: Never Smoker  . Smokeless tobacco: Never Used  Substance and Sexual Activity  . Alcohol use: No    Alcohol/week: 0.0 standard drinks  . Drug use: No  . Sexual activity: Not  Currently  Other Topics Concern  . Not on file  Social History Narrative  . Not on file   Social Determinants of Health   Financial Resource Strain:   . Difficulty of Paying Living Expenses: Not on file  Food Insecurity:   . Worried About Charity fundraiser in the Last Year: Not on file  . Ran Out of Food in the Last Year: Not on file  Transportation Needs:   . Lack of Transportation (Medical): Not on file  . Lack of Transportation (Non-Medical): Not on file  Physical Activity: Inactive  . Days of Exercise per Week: 0 days  . Minutes of Exercise per Session: 0 min  Stress:   . Feeling of Stress : Not on file  Social Connections:   . Frequency of Communication with Friends and Family: Not on file  . Frequency of Social Gatherings with Friends and Family: Not on file  . Attends Religious Services: Not on file  . Active Member of Clubs or Organizations: Not on file  . Attends Archivist Meetings: Not on file  . Marital Status: Not on file  Intimate Partner Violence:   . Fear of Current or Ex-Partner: Not on file  . Emotionally Abused: Not on file  . Physically Abused: Not on file  . Sexually Abused: Not on file    Outpatient Encounter Medications as of 04/21/2019  Medication Sig  . albuterol (PROVENTIL) (2.5 MG/3ML) 0.083% nebulizer solution NEBULIZE 1 VIAL EVERY 6 HOURS AS NEEDED FOR SHORTNESS OF BREATH OR WHEEZING  . amLODipine (NORVASC) 2.5 MG tablet Take 1 tablet (2.5 mg total) by mouth daily.  Marland Kitchen aspirin 81 MG tablet Take 81 mg by mouth every other day.  . blood glucose meter kit and supplies KIT Check BS once daily  . Blood Glucose Monitoring Suppl (ONETOUCH VERIO FLEX SYSTEM) w/Device KIT CHECK GLUCOSE TWICE DAILY AS NEEDED Dx E11.9  . budesonide-formoterol (SYMBICORT) 160-4.5 MCG/ACT inhaler Inhale 2 puffs into the lungs 2 (two) times daily.  . cholecalciferol (VITAMIN D) 1000 UNITS tablet Take 2,000 Units by mouth daily.  . cloNIDine (CATAPRES) 0.1 MG tablet  Take 1 tablet (0.1 mg total) by mouth at bedtime as needed.  Marland Kitchen escitalopram (LEXAPRO) 20 MG tablet Take 1 tablet (20 mg total) by mouth daily. as directed  . fluticasone (FLONASE) 50 MCG/ACT nasal spray USE ONE SPRAY in each nostril ONCE daily  . gabapentin (NEURONTIN) 100 MG capsule TAKE (1) CAPSULE THREE TIMES DAILY.  Marland Kitchen glucose blood (ONETOUCH VERIO) test strip CHECK GLUCOSE TWICE DAILY AS NEEDED Dx E11.9  . ketoconazole (NIZORAL) 2 % shampoo Apply 1 application topically 2 (two) times a week.  Elmore Guise Devices (ONE TOUCH DELICA LANCING DEV) MISC Use to check BG up to twice a day. DX: E11.29 - type 2 DM  . Lancets [facility]  ULTRASOFT) lancets CHECK GLUCOSE TWICE DAILY AS NEEDED Dx E11.9  . lovastatin (MEVACOR) 40 MG tablet Take 1 tablet (40 mg total) by mouth daily.  . Melatonin 5 MG CAPS Take 1 capsule (5 mg total) by mouth at bedtime.  . metFORMIN (GLUCOPHAGE-XR) 500 MG 24 hr tablet 1 tablet once daily  . Omega-3 Fatty Acids (FISH OIL) 1000 MG CAPS Take by mouth.  . triamcinolone cream (KENALOG) 0.1 % Apply 1 application topically 2 (two) times daily.  . [DISCONTINUED] busPIRone (BUSPAR) 10 MG tablet Take 1 tablet (10 mg total) by mouth 3 (three) times daily.  . [DISCONTINUED] cloNIDine (CATAPRES) 0.1 MG tablet Take 1 tablet (0.1 mg total) by mouth at bedtime as needed.  . busPIRone (BUSPAR) 15 MG tablet Take 1 tablet (15 mg total) by mouth 3 (three) times daily.  . [DISCONTINUED] ALPRAZolam (XANAX) 0.25 MG tablet Take 1 tablet (0.25 mg total) by mouth at bedtime as needed for up to 14 days for sleep. (Patient not taking: Reported on 04/21/2019)  . [DISCONTINUED] benzonatate (TESSALON) 100 MG capsule Take by mouth.  . [DISCONTINUED] MELATONIN/VITAMIN B-6 EX ST 5-1 MG TABS TAKE ONE TABLET ATABEDTIME   No facility-administered encounter medications on file as of 04/21/2019.    Allergies  Allergen Reactions  . Lisinopril Cough  . Chlordiazepoxide-Clidinium     Can't remember  .  Clarithromycin     Can't remember  . Penicillins     Can't remember  . Betadine [Povidone Iodine] Rash  . Erythromycin Itching    Can't remember Can't remember  . Evista [Raloxifene Hydrochloride]     Numb feeling  . Hydrogen Peroxide Rash  . Sertraline Hcl Other (See Comments)    Sweating and mouth irritation   . Sulfa Antibiotics Nausea And Vomiting    Review of Systems  Constitutional: Negative for activity change, appetite change, chills, diaphoresis, fatigue, fever and unexpected weight change.  HENT: Negative.   Eyes: Negative.   Respiratory: Negative for cough, chest tightness and shortness of breath.   Cardiovascular: Negative for chest pain, palpitations and leg swelling.  Gastrointestinal: Negative for abdominal pain, blood in stool, constipation, diarrhea, nausea and vomiting.  Endocrine: Negative.   Genitourinary: Negative for decreased urine volume, difficulty urinating, dysuria, frequency and urgency.  Musculoskeletal: Positive for gait problem (uses cane). Negative for arthralgias and myalgias.  Skin: Positive for color change.       Skin lesion to right ear  Allergic/Immunologic: Negative.   Neurological: Positive for weakness (generalized). Negative for dizziness and headaches.  Hematological: Negative.   Psychiatric/Behavioral: Positive for agitation, decreased concentration, dysphoric mood and sleep disturbance. Negative for behavioral problems, confusion, hallucinations, self-injury and suicidal ideas. The patient is nervous/anxious. The patient is not hyperactive.   All other systems reviewed and are negative.       Objective:  BP (!) 153/66   Pulse 92   Temp (!) 97.5 F (36.4 C) (Temporal)   Ht 5' 2"  (1.575 m)   Wt 147 lb 9.6 oz (67 kg)   SpO2 96%   BMI 27.00 kg/m    Wt Readings from Last 3 Encounters:  04/21/19 147 lb 9.6 oz (67 kg)  04/10/19 147 lb 11.3 oz (67 kg)  03/20/19 147 lb 12.8 oz (67 kg)    Physical Exam Vitals and nursing note  reviewed.  Constitutional:      General: She is not in acute distress.    Appearance: Normal appearance. She is well-developed and well-groomed. She is not ill-appearing, toxic-appearing  or diaphoretic.  HENT:     Head: Normocephalic and atraumatic.     Jaw: There is normal jaw occlusion.     Right Ear: Hearing normal.     Left Ear: Hearing normal.     Nose: Nose normal.     Mouth/Throat:     Lips: Pink.     Mouth: Mucous membranes are moist.     Pharynx: Oropharynx is clear. Uvula midline.  Eyes:     General: Lids are normal.     Extraocular Movements: Extraocular movements intact.     Conjunctiva/sclera: Conjunctivae normal.     Pupils: Pupils are equal, round, and reactive to light.  Neck:     Thyroid: No thyroid mass, thyromegaly or thyroid tenderness.     Vascular: No carotid bruit or JVD.     Trachea: Trachea and phonation normal.  Cardiovascular:     Rate and Rhythm: Normal rate and regular rhythm.     Chest Wall: PMI is not displaced.     Pulses: Normal pulses.     Heart sounds: Normal heart sounds. No murmur. No friction rub. No gallop.   Pulmonary:     Effort: Pulmonary effort is normal. No respiratory distress.     Breath sounds: Normal breath sounds. No wheezing.  Abdominal:     General: Bowel sounds are normal. There is no distension or abdominal bruit.     Palpations: Abdomen is soft. There is no hepatomegaly or splenomegaly.     Tenderness: There is no abdominal tenderness. There is no right CVA tenderness or left CVA tenderness.     Hernia: No hernia is present.  Musculoskeletal:        General: Normal range of motion.     Cervical back: Normal range of motion and neck supple.     Right lower leg: No edema.     Left lower leg: No edema.  Lymphadenopathy:     Cervical: No cervical adenopathy.  Skin:    General: Skin is warm and dry.     Capillary Refill: Capillary refill takes less than 2 seconds.     Coloration: Skin is not cyanotic, jaundiced or pale.      Findings: Lesion present. No rash.     Comments: Lesion to triangular fossa of right ear, erythematous, flat, scaly patch. No bleeding or drainage.   Neurological:     General: No focal deficit present.     Mental Status: She is alert and oriented to person, place, and time.     Cranial Nerves: Cranial nerves are intact.     Sensory: Sensation is intact.     Motor: Motor function is intact. No tremor.     Coordination: Coordination is intact.     Gait: Gait abnormal (uses cane).     Deep Tendon Reflexes: Reflexes are normal and symmetric.  Psychiatric:        Attention and Perception: Attention and perception normal.        Mood and Affect: Mood is depressed. Affect is angry. Affect is not tearful.        Speech: Speech normal.        Behavior: Behavior is agitated. Behavior is cooperative.        Thought Content: Thought content normal.        Cognition and Memory: Cognition and memory normal.        Judgment: Judgment normal.     Results for orders placed or performed in visit on 04/03/19  Urine  culture   Specimen: Urine   UR  Result Value Ref Range   Urine Culture, Routine Final report (A)    Organism ID, Bacteria Proteus mirabilis (A)    Antimicrobial Susceptibility Comment   Microscopic Examination   URINE  Result Value Ref Range   WBC, UA >30 (A) 0 - 5 /hpf   RBC 3-10 (A) 0 - 2 /hpf   Epithelial Cells (non renal) 0-10 0 - 10 /hpf   Renal Epithel, UA 0-10 (A) None seen /hpf   Bacteria, UA Many (A) None seen/Few   Yeast, UA Present None seen  urinalysis- dip and micro  Result Value Ref Range   Specific Gravity, UA 1.020 1.005 - 1.030   pH, UA 8.5 (H) 5.0 - 7.5   Color, UA Amber (A) Yellow   Appearance Ur Cloudy (A) Clear   Leukocytes,UA 3+ (A) Negative   Protein,UA Negative Negative/Trace   Glucose, UA Negative Negative   Ketones, UA Negative Negative   RBC, UA Trace (A) Negative   Bilirubin, UA Negative Negative   Urobilinogen, Ur 0.2 0.2 - 1.0 mg/dL    Nitrite, UA Negative Negative   Microscopic Examination See below:        Pertinent labs & imaging results that were available during my care of the patient were reviewed by me and considered in my medical decision making.  Assessment & Plan:  [name] [name] was seen today for hospitalization follow-up.  Diagnoses and all orders for this visit:  Skin lesion of right ear Patient has a flat, erythematous, scaly patch to the triangular fossa of her right ear.  This is concerning for possible carcinoma.  Referral made to dermatology. -     Ambulatory referral to Dermatology  Depression, recurrent [facility]) Depression with anxiety In-depth conversation with patient and caregiver today pertaining to depression and anxiety and the proper treatment of these illnesses.  Discussed the use of Xanax in detail and the proper use for Xanax.  Discussed safety issues pertaining to Xanax and patient's age.  Offered a referral to psychiatry.  Patient refused stating she she is not crazy and there is nothing wrong with her brain.  Educated patient on mental health illnesses and the significance of the role of a psychiatrist in the treatment of these illnesses.  Discussed with the patient and the caregiver about the use of SSRIs and SNRIs for the long-term treatment of anxiety and depression.  Will increase patient's BuSpar to 15 mg 3 times daily as needed today.  Will continue clonidine 0.1 mg at night as needed for sleep.  Patient to continue Lexapro as prescribed.  Will check CMP today to check for electrolyte imbalances due to Lexapro.  Patient aware to take medications as prescribed. Patient voiced that she was unhappy she was no longer receiving her Xanax and does not understand why she cannot have this medication.  Patient advised she could be referred to psychiatry for evaluation and treatment, patient continued to decline.  Patient to follow-up in 1 month or sooner if needed. -     busPIRone (BUSPAR) 15 MG tablet; Take  1 tablet (15 mg total) by mouth 3 (three) times daily. -     cloNIDine (CATAPRES) 0.1 MG tablet; Take 1 tablet (0.1 mg total) by mouth at bedtime as needed. -     CMP14+EGFR  Total time spent with patient 45 minutes.  Greater than 50% of encounter spent in coordination of care/counseling.   Continue all other maintenance medications.  Follow up  plan: Return in about 3 weeks (around 05/12/2019), or if symptoms worsen or fail to improve, for anxiety.  Continue healthy lifestyle choices, including diet (rich in fruits, vegetables, and lean proteins, and low in salt and simple carbohydrates) and exercise (at least 30 minutes of moderate physical activity daily).  The above assessment and management plan was discussed with the patient. The patient verbalized understanding of and has agreed to the management plan. Patient is aware to call the clinic if they develop any new symptoms or if symptoms persist or worsen. Patient is aware when to return to the clinic for a follow-up visit. Patient educated on when it is appropriate to go to the emergency department.   Monia Pouch, FNP-C [facility] [phone]

## 2019-04-21 NOTE — Telephone Encounter (Signed)
Patient seen today

## 2019-04-22 LAB — CMP14+EGFR
ALT: 12 IU/L (ref 0–32)
AST: 19 IU/L (ref 0–40)
Albumin/Globulin Ratio: 1.9 (ref 1.2–2.2)
Albumin: 4.5 g/dL (ref 3.6–4.6)
Alkaline Phosphatase: 82 IU/L (ref 39–117)
BUN/Creatinine Ratio: 10 — ABNORMAL LOW (ref 12–28)
BUN: 8 mg/dL (ref 8–27)
Bilirubin Total: 0.4 mg/dL (ref 0.0–1.2)
CO2: 23 mmol/L (ref 20–29)
Calcium: 9.6 mg/dL (ref 8.7–10.3)
Chloride: 98 mmol/L (ref 96–106)
Creatinine, Ser: 0.83 mg/dL (ref 0.57–1.00)
GFR calc Af Amer: 74 mL/min/{1.73_m2} (ref >59)
GFR calc non Af Amer: 65 mL/min/{1.73_m2} (ref >59)
Globulin, Total: 2.4 g/dL (ref 1.5–4.5)
Glucose: 140 mg/dL — ABNORMAL HIGH (ref 65–99)
Potassium: 5.4 mmol/L — ABNORMAL HIGH (ref 3.5–5.2)
Sodium: 136 mmol/L (ref 134–144)
Total Protein: 6.9 g/dL (ref 6.0–8.5)

## 2019-04-24 ENCOUNTER — Other Ambulatory Visit: Payer: Self-pay | Admitting: Family Medicine

## 2019-04-24 DIAGNOSIS — J0140 Acute pansinusitis, unspecified: Secondary | ICD-10-CM

## 2019-04-24 MED ORDER — DOXYCYCLINE HYCLATE 100 MG PO TABS: 100.0000 mg | ORAL_TABLET | Freq: Two times a day (BID) | ORAL | 0 refills | Status: AC

## 2019-04-28 ENCOUNTER — Emergency Department (HOSPITAL_COMMUNITY)
Admission: EM | Admit: 2019-04-28 | Discharge: 2019-04-28 | Disposition: A | Discharge status: Home / Self Care | Payer: Medicare Other | Attending: Emergency Medicine | Admitting: Emergency Medicine

## 2019-04-28 ENCOUNTER — Ambulatory Visit (INDEPENDENT_AMBULATORY_CARE_PROVIDER_SITE_OTHER): Payer: Medicare Other | Admitting: Family Medicine

## 2019-04-28 ENCOUNTER — Encounter (HOSPITAL_COMMUNITY): Payer: Self-pay | Admitting: Emergency Medicine

## 2019-04-28 ENCOUNTER — Encounter: Payer: Self-pay | Admitting: Family Medicine

## 2019-04-28 ENCOUNTER — Other Ambulatory Visit: Payer: Self-pay

## 2019-04-28 ENCOUNTER — Emergency Department (HOSPITAL_COMMUNITY): Payer: Medicare Other

## 2019-04-28 DIAGNOSIS — E119 Type 2 diabetes mellitus without complications: Secondary | ICD-10-CM | POA: Insufficient documentation

## 2019-04-28 DIAGNOSIS — R079 Chest pain, unspecified: Secondary | ICD-10-CM | POA: Diagnosis not present

## 2019-04-28 DIAGNOSIS — I10 Essential (primary) hypertension: Secondary | ICD-10-CM | POA: Diagnosis not present

## 2019-04-28 DIAGNOSIS — Z7982 Long term (current) use of aspirin: Secondary | ICD-10-CM | POA: Insufficient documentation

## 2019-04-28 DIAGNOSIS — Z96642 Presence of left artificial hip joint: Secondary | ICD-10-CM | POA: Diagnosis not present

## 2019-04-28 DIAGNOSIS — Z7984 Long term (current) use of oral hypoglycemic drugs: Secondary | ICD-10-CM | POA: Diagnosis not present

## 2019-04-28 DIAGNOSIS — R0789 Other chest pain: Secondary | ICD-10-CM

## 2019-04-28 DIAGNOSIS — Z79899 Other long term (current) drug therapy: Secondary | ICD-10-CM | POA: Diagnosis not present

## 2019-04-28 DIAGNOSIS — J449 Chronic obstructive pulmonary disease, unspecified: Secondary | ICD-10-CM | POA: Diagnosis not present

## 2019-04-28 LAB — BASIC METABOLIC PANEL
Anion gap: 12 (ref 5–15)
BUN: 14 mg/dL (ref 8–23)
CO2: 21 mmol/L — ABNORMAL LOW (ref 22–32)
Calcium: 9.4 mg/dL (ref 8.9–10.3)
Chloride: 99 mmol/L (ref 98–111)
Creatinine, Ser: 0.71 mg/dL (ref 0.44–1.00)
GFR calc Af Amer: >60 mL/min (ref >60)
GFR calc non Af Amer: >60 mL/min (ref >60)
Glucose, Bld: 130 mg/dL — ABNORMAL HIGH (ref 70–99)
Potassium: 4.3 mmol/L (ref 3.5–5.1)
Sodium: 132 mmol/L — ABNORMAL LOW (ref 135–145)

## 2019-04-28 LAB — CBC
HCT: 44.9 % (ref 36.0–46.0)
Hemoglobin: 14.8 g/dL (ref 12.0–15.0)
MCH: 30.7 pg (ref 26.0–34.0)
MCHC: 33 g/dL (ref 30.0–36.0)
MCV: 93.2 fL (ref 80.0–100.0)
Platelets: 363 10*3/uL (ref 150–400)
RBC: 4.82 MIL/uL (ref 3.87–5.11)
RDW: 13.2 % (ref 11.5–15.5)
WBC: 10.5 10*3/uL (ref 4.0–10.5)
nRBC: 0 % (ref 0.0–0.2)

## 2019-04-28 LAB — TROPONIN I (HIGH SENSITIVITY)
Troponin I (High Sensitivity): <2 ng/L (ref <18)
Troponin I (High Sensitivity): <2 ng/L (ref <18)

## 2019-04-28 MED ORDER — ALPRAZOLAM 0.5 MG PO TABS
0.2500 mg | ORAL_TABLET | Freq: Once | ORAL | Status: AC
  Administered 2019-04-28: 19:00:00 0.25 mg via ORAL
  Filled 2019-04-28: qty 1

## 2019-04-28 MED ORDER — ALPRAZOLAM 0.25 MG PO TABS: 0.2500 mg | ORAL_TABLET | Freq: Two times a day (BID) | ORAL | 0 refills | Status: DC | PRN

## 2019-04-28 NOTE — Addendum Note (Signed)
Addended by: Baruch Gouty on: 04/28/2019 02:20 PM   Modules accepted: Orders

## 2019-04-28 NOTE — ED Notes (Signed)
Pt reports she was "treated like a dog" by [facility] dr Building control surveyor as well as the NP  She reports they have removed her xanax and her anxiety is out of roof  She further reports that she has had CP as a result of this

## 2019-04-28 NOTE — ED Provider Notes (Signed)
[facility] Provider Note   CSN: [id number] Arrival date & time: 04/28/19  1534     History Chief Complaint  Patient presents with  . Chest Pain    [name] [name] is a 86 y.o. female.  Patient states that her doctor stopped her Xanax that she was taking a quarter of a milligram twice a day for a number of years.  She states that since then she has been unable to sleep and now she has this aching in her chest for many days  The history is provided by the patient. No language interpreter was used.  Chest Pain Pain location:  L chest Pain quality: aching   Pain radiates to:  Does not radiate Pain severity:  Mild Onset quality:  Gradual Timing:  Constant Progression:  Unchanged Chronicity:  New Associated symptoms: no abdominal pain, no back pain, no cough, no fatigue and no headache        Past Medical History:  Diagnosis Date  . Anxiety   . Arthritis   . Atrophic vaginitis   . Cataract   . COPD (chronic obstructive pulmonary disease) ([place])   . Depressive disorder, not elsewhere classified   . Diabetes mellitus without complication ([place])   . Disorder of bone and cartilage, unspecified   . Elevated blood sugar   . Emphysema lung ([place])   . Hypertension   . Other and unspecified hyperlipidemia   . Postmenopausal   . Rhinitis     Patient Active Problem List   Diagnosis Date Noted  . Hypertension associated with diabetes ([place]) 02/20/2019  . Hyperlipidemia associated with type 2 diabetes mellitus ([place]) 02/20/2019  . Depression, recurrent ([place]) 02/20/2019  . Type 2 diabetes mellitus without complication, without long-term current use of insulin ([place]) 09/26/2015  . COPD (chronic obstructive pulmonary disease) ([place]) 05/29/2015  . Dermatographia 05/16/2015  . Rectal bleeding 03/07/2014  . History of total left hip arthroplasty 01/25/2014  . Edema 01/02/2014  . Hypoxia 01/02/2014  . S/P total hip arthroplasty 12/27/2013  . Anxiety   .  Depression with anxiety   . Rhinitis   . Atrophic vaginitis   . Postmenopausal     Past Surgical History:  Procedure Laterality Date  . APPENDECTOMY  1957  . CATARACT EXTRACTION W/ INTRAOCULAR LENS IMPLANT Bilateral 2005   [place]  . COLONOSCOPY N/A 03/08/2014   Procedure: COLONOSCOPY;  Surgeon: Rogene Houston, MD;  [facility];  Service: Endoscopy;  Laterality: N/A;  . EYE SURGERY    . JOINT REPLACEMENT    . TOTAL HIP ARTHROPLASTY Left 12/27/2013   Procedure: LEFT TOTAL HIP ARTHROPLASTY;  Surgeon: Newt Minion, MD;  Location: [facility];  Service: Orthopedics;  Laterality: Left;     OB History   No obstetric history on file.     Family History  Problem Relation Age of Onset  . Arthritis Mother   . COPD Father   . Diabetes Father   . Aneurysm Sister   . Diabetes Sister   . Diabetes Sister   . Hypertension Sister   . COPD Daughter   . Fibromyalgia Daughter   . Healthy Daughter     Social History   Tobacco Use  . Smoking status: Never Smoker  . Smokeless tobacco: Never Used  Substance Use Topics  . Alcohol use: No    Alcohol/week: 0.0 standard drinks  . Drug use: No    Home Medications Prior to Admission medications   Medication Sig Start Date  End Date Taking? Authorizing Provider  albuterol (PROVENTIL) (2.5 MG/3ML) 0.083% nebulizer solution NEBULIZE 1 VIAL EVERY 6 HOURS AS NEEDED FOR SHORTNESS OF BREATH OR WHEEZING Patient taking differently: Take 2.5 mg by nebulization every 6 (six) hours as needed for wheezing or shortness of breath.  09/19/18  Yes Martin, Mary-Margaret, FNP  amLODipine (NORVASC) 2.5 MG tablet Take 1 tablet (2.5 mg total) by mouth daily. 03/20/19  Yes Dettinger, Fransisca Kaufmann, MD  aspirin 81 MG tablet Take 81 mg by mouth every other day.   Yes [provider]  blood glucose meter kit and supplies KIT Check BS once daily 12/12/15  Yes Chipper Herb, MD  Blood Glucose Monitoring Suppl ([place]) w/Device KIT CHECK  GLUCOSE TWICE DAILY AS NEEDED Dx E11.9 03/13/19  Yes Rakes, Connye Burkitt, FNP  budesonide-formoterol (SYMBICORT) 160-4.5 MCG/ACT inhaler Inhale 2 puffs into the lungs 2 (two) times daily. 10/05/17  Yes Dettinger, Fransisca Kaufmann, MD  busPIRone (BUSPAR) 15 MG tablet Take 1 tablet (15 mg total) by mouth 3 (three) times daily. 04/21/19 05/21/19 Yes Rakes, Connye Burkitt, FNP  cholecalciferol (VITAMIN D) 1000 UNITS tablet Take 2,000 Units by mouth daily.   Yes [provider]  cloNIDine (CATAPRES) 0.1 MG tablet Take 1 tablet (0.1 mg total) by mouth at bedtime as needed. 04/21/19  Yes Rakes, Connye Burkitt, FNP  doxycycline (VIBRA-TABS) 100 MG tablet Take 1 tablet (100 mg total) by mouth 2 (two) times daily for 10 days. 1 po bid 04/24/19 05/04/19 Yes Rakes, Connye Burkitt, FNP  fluticasone [facility]) 50 MCG/ACT nasal spray USE ONE SPRAY in each nostril ONCE daily 03/09/16  Yes Hawks, Christy A, FNP  gabapentin (NEURONTIN) 100 MG capsule TAKE (1) CAPSULE THREE TIMES DAILY. 03/20/19  Yes Dettinger, Fransisca Kaufmann, MD  glucose blood (ONETOUCH VERIO) test strip CHECK GLUCOSE TWICE DAILY AS NEEDED Dx E11.9 03/13/19  Yes Rakes, Connye Burkitt, FNP  ketoconazole (NIZORAL) 2 % shampoo Apply 1 application topically 2 (two) times a week. 05/26/18  Yes Hassell Done, Mary-Margaret, FNP  Lancet Devices (ONE TOUCH DELICA LANCING DEV) MISC Use to check BG up to twice a day. DX: E11.29 - type 2 DM 09/02/16  Yes Chipper Herb, MD  Lancets [facility] ULTRASOFT) lancets CHECK GLUCOSE TWICE DAILY AS NEEDED Dx E11.9 03/13/19  Yes Rakes, Connye Burkitt, FNP  lovastatin (MEVACOR) 40 MG tablet Take 1 tablet (40 mg total) by mouth daily. 03/20/19  Yes Dettinger, Fransisca Kaufmann, MD  Melatonin 5 MG CAPS Take 1 capsule (5 mg total) by mouth at bedtime. 03/20/19  Yes Dettinger, Fransisca Kaufmann, MD  metFORMIN (GLUCOPHAGE-XR) 500 MG 24 hr tablet 1 tablet once daily 03/20/19  Yes Dettinger, Fransisca Kaufmann, MD  Omega-3 Fatty Acids (FISH OIL) 1000 MG CAPS Take by mouth.   Yes [provider]  triamcinolone cream  (KENALOG) 0.1 % Apply 1 application topically 2 (two) times daily. 02/20/19  Yes Rakes, Connye Burkitt, FNP  ALPRAZolam Duanne Moron) 0.25 MG tablet Take 1 tablet (0.25 mg total) by mouth 2 (two) times daily as needed for anxiety. 04/28/19   Milton Ferguson, MD  escitalopram (LEXAPRO) 20 MG tablet Take 1 tablet (20 mg total) by mouth daily. as directed 03/20/19   Dettinger, Fransisca Kaufmann, MD    Allergies    Lisinopril, Chlordiazepoxide-clidinium, Clarithromycin, Penicillins, Betadine [povidone iodine], Erythromycin, Evista [raloxifene hydrochloride], Hydrogen peroxide, Sertraline hcl, and Sulfa antibiotics  Review of Systems   Review of Systems  Constitutional: Negative for appetite change and fatigue.  HENT: Negative for  congestion, ear discharge and sinus pressure.   Eyes: Negative for discharge.  Respiratory: Negative for cough.   Cardiovascular: Positive for chest pain.  Gastrointestinal: Negative for abdominal pain and diarrhea.  Genitourinary: Negative for frequency and hematuria.  Musculoskeletal: Negative for back pain.  Skin: Negative for rash.  Neurological: Negative for seizures and headaches.  Psychiatric/Behavioral: Positive for agitation. Negative for hallucinations.    Physical Exam Updated Vital Signs BP (!) 190/78 (BP Location: Right Arm)   Pulse 92   Temp 99.3 F (37.4 C) (Oral)   Resp 16   Ht 5' 2" (1.575 m)   Wt 68 kg   SpO2 95%   BMI 27.44 kg/m   Physical Exam Vitals and nursing note reviewed.  Constitutional:      Appearance: She is well-developed.  HENT:     Head: Normocephalic.     Nose: Nose normal.  Eyes:     General: No scleral icterus.    Conjunctiva/sclera: Conjunctivae normal.  Neck:     Thyroid: No thyromegaly.  Cardiovascular:     Rate and Rhythm: Normal rate and regular rhythm.     Heart sounds: No murmur. No friction rub. No gallop.   Pulmonary:     Breath sounds: No stridor. No wheezing or rales.  Chest:     Chest wall: No tenderness.  Abdominal:      General: There is no distension.     Tenderness: There is no abdominal tenderness. There is no rebound.  Musculoskeletal:        General: Normal range of motion.     Cervical back: Neck supple.  Lymphadenopathy:     Cervical: No cervical adenopathy.  Skin:    Findings: No erythema or rash.  Neurological:     Mental Status: She is oriented to person, place, and time.     Motor: No abnormal muscle tone.     Coordination: Coordination normal.     Comments: Anxious  Psychiatric:        Behavior: Behavior normal.     ED Results / Procedures / Treatments   Labs (all labs ordered are listed, but only abnormal results are displayed) Labs Reviewed  BASIC METABOLIC PANEL - Abnormal; Notable for the following components:      Result Value   Sodium 132 (*)    CO2 21 (*)    Glucose, Bld 130 (*)    All other components within normal limits  CBC  TROPONIN I (HIGH SENSITIVITY)  TROPONIN I (HIGH SENSITIVITY)    EKG EKG Interpretation  Date/Time:  Friday April 28 2019 16:12:13 EST Ventricular Rate:  96 PR Interval:  138 QRS Duration: 86 QT Interval:  356 QTC Calculation: 449 R Axis:   -46 Text Interpretation: Normal sinus rhythm Left axis deviation Anteroseptal infarct , age undetermined Abnormal ECG Confirmed by Milton Ferguson [phone]) on 04/28/2019 6:51:57 PM Also confirmed by Milton Ferguson [phone])  on 04/28/2019 6:58:02 PM   Radiology DG Chest 2 View  Result Date: 04/28/2019 CLINICAL DATA:  Chest pain for several days EXAM: CHEST - 2 VIEW COMPARISON:  Radiograph 05/29/2015 FINDINGS: Chronic hyperinflation of the lungs with flattening of the diaphragms. Biapical pleuroparenchymal scarring is similar to comparison. Mild coarsened interstitial and bronchitic changes are also similar features to comparison exam. No focal consolidation, features of edema, pneumothorax, or effusion. The aorta is calcified. The remaining cardiomediastinal contours are unremarkable. No acute osseous or  soft tissue abnormality. IMPRESSION: Stable features of hyperinflation, chronic bronchitic changes and biapical scarring.  No acute cardiopulmonary abnormality. Aortic Atherosclerosis (ICD10-I70.0). Electronically Signed   By: Lovena Le M.D.   On: 04/28/2019 17:11    Procedures Procedures (including critical care time)  Medications Ordered in ED Medications  ALPRAZolam Duanne Moron) tablet 0.25 mg (0.25 mg Oral Given 04/28/19 1922)    ED Course  I have reviewed the triage vital signs and the nursing notes.  Pertinent labs & imaging results that were available during my care of the patient were reviewed by me and considered in my medical decision making (see chart for details).    MDM Rules/Calculators/A&P                      Labs EKG unremarkable.  Patient is having atypical chest pain doubt coronary artery disease.  Patient will be placed back on her quarter milligram of Xanax twice daily as needed and will follow-up Final Clinical Impression(s) / ED Diagnoses Final diagnoses:  Atypical chest pain    Rx / DC Orders ED Discharge Orders         Ordered    ALPRAZolam (XANAX) 0.25 MG tablet  2 times daily PRN     04/28/19 2134           Milton Ferguson, MD 04/28/19 2138

## 2019-04-28 NOTE — Patient Instructions (Signed)
Pt encouraged to go to ED, pt declined.

## 2019-04-28 NOTE — Progress Notes (Signed)
Virtual Visit via telephone Note Due to COVID-19 pandemic this visit was conducted virtually. This visit type was conducted due to national recommendations for restrictions regarding the COVID-19 Pandemic (e.g. social distancing, sheltering in place) in an effort to limit this patient's exposure and mitigate transmission in our community. All issues noted in this document were discussed and addressed.  A physical exam was not performed with this format.   I connected with [name] [name] on 04/28/2019 at 0815 by telephone and verified that I am speaking with the correct person using two identifiers. [name] [name] is currently located at home and no one is currently with them during visit. The provider, Monia Pouch, FNP is located in their office at time of visit.  I discussed the limitations, risks, security and privacy concerns of performing an evaluation and management service by telephone and the availability of in person appointments. I also discussed with the patient that there may be a patient responsible charge related to this service. The patient expressed understanding and agreed to proceed.  Subjective:  Patient ID: [name] [name], female    DOB: 09/23/1931, 86 y.o.   MRN: [id number]  Chief Complaint:  Chest Pain   HPI: [name] [name] is a 86 y.o. female presenting on 04/28/2019 for Chest Pain   Chest Pain  This is a new problem. The current episode started yesterday. The onset quality is sudden. The problem occurs constantly. The problem has been unchanged. The pain is present in the substernal region (left chest). The pain is at a severity of 5/10. The pain is moderate. The pain does not radiate. Associated symptoms include malaise/fatigue and weakness. Pertinent negatives include no abdominal pain, back pain, cough, diaphoresis, dizziness, exertional chest pressure, fever, headaches, hemoptysis, leg pain, lower extremity edema, nausea, near-syncope, numbness, orthopnea,  palpitations, PND, shortness of breath, sputum production, syncope or vomiting. The pain is aggravated by nothing. She has tried nothing for the symptoms. Risk factors include post-menopausal, sedentary lifestyle, lack of exercise and being elderly.  Her past medical history is significant for COPD, diabetes, hyperlipidemia and hypertension.  Pertinent negatives for past medical history include no seizures.   Pt has recently been weaned off of her xanax. Pt states she is still having trouble sleeping and feels hot all of the time. She is taking medications as prescribed to help with this. States they are not beneficial and she needs her xanax. Pt offered referral to psychiatry and pt again refused.   Relevant past medical, surgical, family, and social history reviewed and updated as indicated.  Allergies and medications reviewed and updated.   Past Medical History:  Diagnosis Date  . Anxiety   . Arthritis   . Atrophic vaginitis   . Cataract   . COPD (chronic obstructive pulmonary disease) ([place])   . Depressive disorder, not elsewhere classified   . Diabetes mellitus without complication ([place])   . Disorder of bone and cartilage, unspecified   . Elevated blood sugar   . Emphysema lung ([place])   . Hypertension   . Other and unspecified hyperlipidemia   . Postmenopausal   . Rhinitis     Past Surgical History:  Procedure Laterality Date  . APPENDECTOMY  1957  . CATARACT EXTRACTION W/ INTRAOCULAR LENS IMPLANT Bilateral 2005   [place]  . COLONOSCOPY N/A 03/08/2014   Procedure: COLONOSCOPY;  Surgeon: Rogene Houston, MD;  [facility];  Service: Endoscopy;  Laterality: N/A;  . EYE SURGERY    . JOINT  REPLACEMENT    . TOTAL HIP ARTHROPLASTY Left 12/27/2013   Procedure: LEFT TOTAL HIP ARTHROPLASTY;  Surgeon: Newt Minion, MD;  Location: [place];  Service: Orthopedics;  Laterality: Left;    Social History   Socioeconomic History  . Marital status: Widowed    Spouse name: Not on  file  . Number of children: 2  . Years of education: 10  . Highest education level: 10th grade  Occupational History  . 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 Smoking status: Never Smoker  . Smokeless tobacco: Never Used  Substance and Sexual Activity  . Alcohol use: No    Alcohol/week: 0.0 standard drinks  . Drug use: No  . Sexual activity: Not Currently  Other Topics Concern  . Not on file  Social History Narrative  . Not on file   Social Determinants of Health   Financial Resource Strain:   . Difficulty of Paying Living Expenses: Not on file  Food Insecurity:   . Worried About Charity fundraiser in the Last Year: Not on file  . Ran Out of Food in the Last Year: Not on file  Transportation Needs:   . Lack of Transportation (Medical): Not on file  . Lack of Transportation (Non-Medical): Not on file  Physical Activity: Inactive  . Days of Exercise per Week: 0 days  . Minutes of Exercise per Session: 0 min  Stress:   . Feeling of Stress : Not on file  Social Connections:   . Frequency of Communication with Friends and Family: Not on file  . Frequency of Social Gatherings with Friends and Family: Not on file  . Attends Religious Services: Not on file  . Active Member of Clubs or Organizations: Not on file  . Attends Archivist Meetings: Not on file  . Marital Status: Not on file  Intimate Partner Violence:   . Fear of Current or Ex-Partner: Not on file  . Emotionally Abused: Not on file  . Physically Abused: Not on file  . Sexually Abused: Not on file    Outpatient Encounter Medications as of 04/28/2019  Medication Sig  . albuterol (PROVENTIL) (2.5 MG/3ML) 0.083% nebulizer solution NEBULIZE 1 VIAL EVERY 6 HOURS AS NEEDED FOR SHORTNESS OF BREATH OR WHEEZING  . amLODipine (NORVASC) 2.5 MG tablet Take 1 tablet (2.5 mg total) by mouth daily.  Marland Kitchen aspirin 81 MG tablet Take 81 mg by mouth every other day.  . blood glucose  meter kit and supplies KIT Check BS once daily  . Blood Glucose Monitoring Suppl (ONETOUCH VERIO FLEX SYSTEM) w/Device KIT CHECK GLUCOSE TWICE DAILY AS NEEDED Dx E11.9  . budesonide-formoterol (SYMBICORT) 160-4.5 MCG/ACT inhaler Inhale 2 puffs into the lungs 2 (two) times daily.  . busPIRone (BUSPAR) 15 MG tablet Take 1 tablet (15 mg total) by mouth 3 (three) times daily.  . cholecalciferol (VITAMIN D) 1000 UNITS tablet Take 2,000 Units by mouth daily.  . cloNIDine (CATAPRES) 0.1 MG tablet Take 1 tablet (0.1 mg total) by mouth at bedtime as needed.  . doxycycline (VIBRA-TABS) 100 MG tablet Take 1 tablet (100 mg total) by mouth 2 (two) times daily for 10 days. 1 po bid  . escitalopram (LEXAPRO) 20 MG tablet Take 1 tablet (20 mg total) by mouth daily. as directed  . fluticasone (FLONASE) 50 MCG/ACT nasal spray USE ONE SPRAY in each nostril ONCE daily  . gabapentin (NEURONTIN) 100 MG capsule TAKE (1) CAPSULE  THREE TIMES DAILY.  Marland Kitchen glucose blood (ONETOUCH VERIO) test strip CHECK GLUCOSE TWICE DAILY AS NEEDED Dx E11.9  . ketoconazole (NIZORAL) 2 % shampoo Apply 1 application topically 2 (two) times a week.  Elmore Guise Devices (ONE TOUCH DELICA LANCING DEV) MISC Use to check BG up to twice a day. DX: E11.29 - type 2 DM  . Lancets (ONETOUCH ULTRASOFT) lancets CHECK GLUCOSE TWICE DAILY AS NEEDED Dx E11.9  . lovastatin (MEVACOR) 40 MG tablet Take 1 tablet (40 mg total) by mouth daily.  . Melatonin 5 MG CAPS Take 1 capsule (5 mg total) by mouth at bedtime.  . metFORMIN (GLUCOPHAGE-XR) 500 MG 24 hr tablet 1 tablet once daily  . Omega-3 Fatty Acids (FISH OIL) 1000 MG CAPS Take by mouth.  . triamcinolone cream (KENALOG) 0.1 % Apply 1 application topically 2 (two) times daily.   No facility-administered encounter medications on file as of 04/28/2019.    Allergies  Allergen Reactions  . Lisinopril Cough  . Chlordiazepoxide-Clidinium     Can't remember  . Clarithromycin     Can't remember  . Penicillins       Can't remember  . Betadine [Povidone Iodine] Rash  . Erythromycin Itching    Can't remember Can't remember  . Evista [Raloxifene Hydrochloride]     Numb feeling  . Hydrogen Peroxide Rash  . Sertraline Hcl Other (See Comments)    Sweating and mouth irritation   . Sulfa Antibiotics Nausea And Vomiting    Review of Systems  Constitutional: Positive for activity change, appetite change, fatigue and malaise/fatigue. Negative for chills, diaphoresis, fever and unexpected weight change.  Eyes: Negative for photophobia and visual disturbance.  Respiratory: Positive for chest tightness. Negative for cough, hemoptysis, sputum production and shortness of breath.   Cardiovascular: Positive for chest pain. Negative for palpitations, orthopnea, syncope, PND and near-syncope.  Gastrointestinal: Negative for abdominal pain, nausea and vomiting.  Musculoskeletal: Positive for arthralgias, gait problem and myalgias. Negative for back pain.  Neurological: Positive for tremors and weakness. Negative for dizziness, seizures, syncope, facial asymmetry, speech difficulty, light-headedness, numbness and headaches.  Psychiatric/Behavioral: Positive for sleep disturbance. Negative for self-injury. The patient is nervous/anxious.   All other systems reviewed and are negative.        Observations/Objective: No vital signs or physical exam, this was a telephone or virtual health encounter.  Pt alert and oriented, answers all questions appropriately, and able to speak in full sentences.    Assessment and Plan: Renesmee was seen today for chest pain.  Diagnoses and all orders for this visit:  Chest pain in adult Pt with persistent left chest pain who refused to go to ED for further evaluation. Advised pt she needed to go due to reported symptoms and medical history. Pt refused. Pt states she would go to [facility] and have an EKG. Order placed. Will advise pt of results and treatment plan once completed.   Encouraged pt to go to the ED again, pt refused.  -     EKG 12-Lead     Follow Up Instructions: Return in about 1 week (around 05/05/2019), or if symptoms worsen or fail to improve, for anxiety.    I discussed the assessment and treatment plan with the patient. The patient was provided an opportunity to ask questions and all were answered. The patient agreed with the plan and demonstrated an understanding of the instructions.   The patient was advised to call back or seek an in-person evaluation if the symptoms worsen  or if the condition fails to improve as anticipated.  The above assessment and management plan was discussed with the patient. The patient verbalized understanding of and has agreed to the management plan. Patient is aware to call the clinic if they develop any new symptoms or if symptoms persist or worsen. Patient is aware when to return to the clinic for a follow-up visit. Patient educated on when it is appropriate to go to the emergency department.    I provided 25 minutes of non-face-to-face time during this encounter. The call started at 0815. The call ended at [place]. The other time was used for coordination of care.    Monia Pouch, FNP-C [facility] [address] [place],  [address] [phone] 04/28/2019

## 2019-04-28 NOTE — ED Triage Notes (Signed)
Pt reports chest pain for last several days. Pt sent over by [facility] and reports pt also recently weaned off of xanax. Pt tearful and reports " I need my medicine back I can't sleep without it."   Pt denies n/v/dizziness, shortness of breath.

## 2019-04-28 NOTE — Progress Notes (Signed)
Pt had EKG completed in office. ST with changed from baseline. No ST elevation. Pt complaining of shortness of breath also. Dr. Warrick Parisian went to see pt and encouraged her to go to ED as discussed. Pt agreed to allow her daughter to take her to the ED.   Monia Pouch, FNP-C [facility] [address] [place], [place] [address] [phone]

## 2019-04-28 NOTE — Discharge Instructions (Addendum)
Follow-up with your family doctor 

## 2019-04-28 NOTE — ED Notes (Signed)
Call to lab re: troponin  Now report of 10 more minutes

## 2019-05-02 ENCOUNTER — Encounter: Payer: Self-pay | Admitting: Family Medicine

## 2019-05-04 DIAGNOSIS — L820 Inflamed seborrheic keratosis: Secondary | ICD-10-CM | POA: Diagnosis not present

## 2019-05-04 DIAGNOSIS — H61001 Unspecified perichondritis of right external ear: Secondary | ICD-10-CM | POA: Diagnosis not present

## 2019-05-19 ENCOUNTER — Other Ambulatory Visit: Payer: Self-pay | Admitting: *Deleted

## 2019-05-19 DIAGNOSIS — F339 Major depressive disorder, recurrent, unspecified: Secondary | ICD-10-CM

## 2019-05-19 DIAGNOSIS — J069 Acute upper respiratory infection, unspecified: Secondary | ICD-10-CM

## 2019-05-19 DIAGNOSIS — F418 Other specified anxiety disorders: Secondary | ICD-10-CM

## 2019-05-19 MED ORDER — FLUTICASONE PROPIONATE 50 MCG/ACT NA SUSP: NASAL | 0 refills | Status: AC

## 2019-05-19 MED ORDER — CLONIDINE HCL 0.1 MG PO TABS: 0.1000 mg | ORAL_TABLET | Freq: Every evening | ORAL | 0 refills | Status: AC | PRN

## 2019-05-25 ENCOUNTER — Ambulatory Visit: Payer: Medicare Other | Admitting: Family Medicine

## 2019-05-29 DIAGNOSIS — E118 Type 2 diabetes mellitus with unspecified complications: Secondary | ICD-10-CM | POA: Diagnosis not present

## 2019-05-29 DIAGNOSIS — F419 Anxiety disorder, unspecified: Secondary | ICD-10-CM | POA: Diagnosis not present

## 2019-05-29 DIAGNOSIS — I10 Essential (primary) hypertension: Secondary | ICD-10-CM | POA: Diagnosis not present

## 2019-05-29 DIAGNOSIS — F322 Major depressive disorder, single episode, severe without psychotic features: Secondary | ICD-10-CM | POA: Diagnosis not present

## 2019-08-16 ENCOUNTER — Other Ambulatory Visit: Payer: Self-pay | Admitting: Family Medicine

## 2019-08-25 DIAGNOSIS — E118 Type 2 diabetes mellitus with unspecified complications: Secondary | ICD-10-CM | POA: Diagnosis not present

## 2019-08-25 DIAGNOSIS — I10 Essential (primary) hypertension: Secondary | ICD-10-CM | POA: Diagnosis not present

## 2019-08-25 DIAGNOSIS — M79602 Pain in left arm: Secondary | ICD-10-CM | POA: Diagnosis not present

## 2019-08-25 DIAGNOSIS — F419 Anxiety disorder, unspecified: Secondary | ICD-10-CM | POA: Diagnosis not present

## 2019-09-12 ENCOUNTER — Other Ambulatory Visit: Payer: Self-pay | Admitting: Family Medicine

## 2019-09-12 DIAGNOSIS — F339 Major depressive disorder, recurrent, unspecified: Secondary | ICD-10-CM

## 2019-09-12 DIAGNOSIS — F418 Other specified anxiety disorders: Secondary | ICD-10-CM

## 2019-09-28 DIAGNOSIS — L0231 Cutaneous abscess of buttock: Secondary | ICD-10-CM | POA: Diagnosis not present

## 2019-10-04 DIAGNOSIS — Z0120 Encounter for dental examination and cleaning without abnormal findings: Secondary | ICD-10-CM | POA: Diagnosis not present

## 2019-11-04 IMAGING — US US RENAL
1 series · 14 of 25 positions shown · non-contrast
Comparison: 09/13/2015 CT.

CLINICAL DATA: 85-year-old female with recent UTIs. Initial
encounter.

EXAM:
RENAL / URINARY TRACT ULTRASOUND COMPLETE

[Series 1: us renal · 0.23mm/px · 14 of 36 slices shown]
[im 1/36]
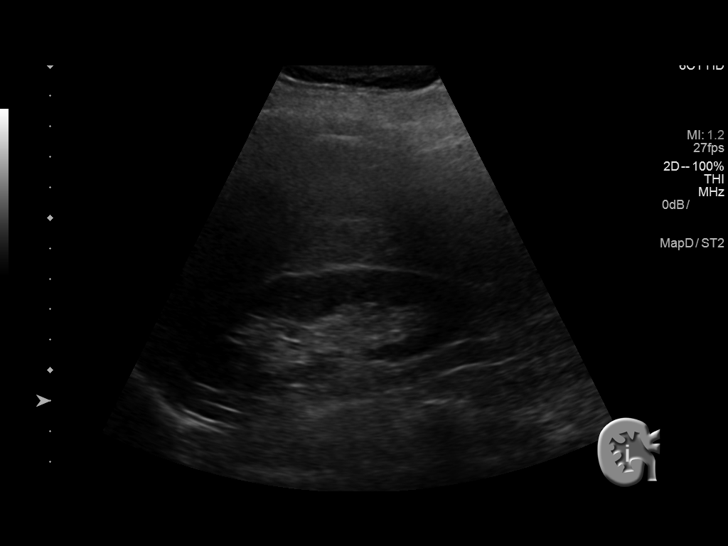
[im 3/36]
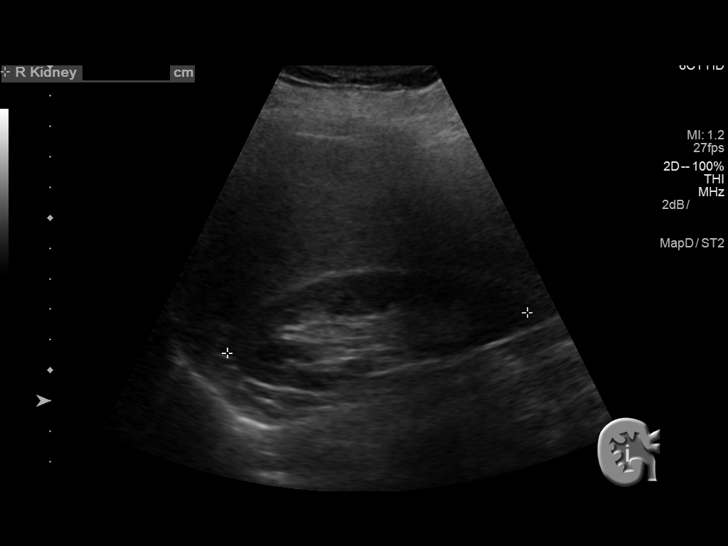
[im 6/36]
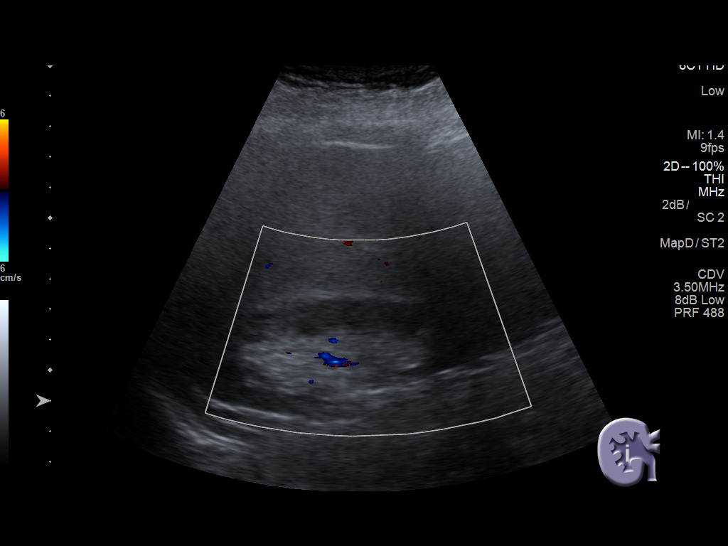
[im 9/36]
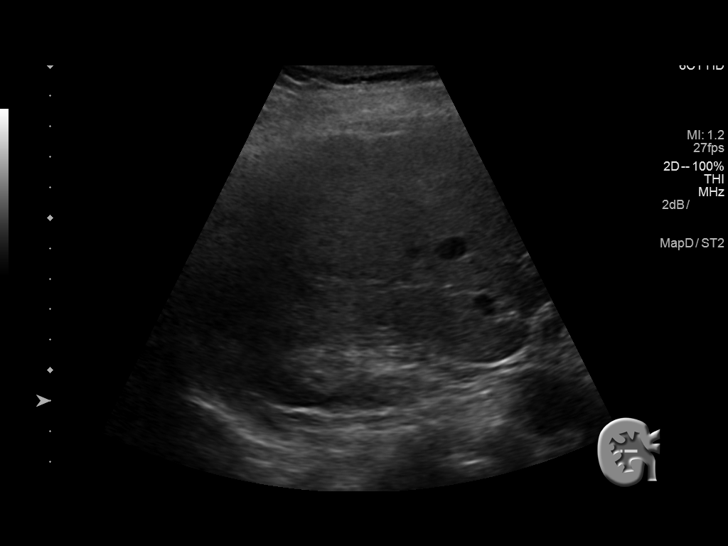
[im 12/36]
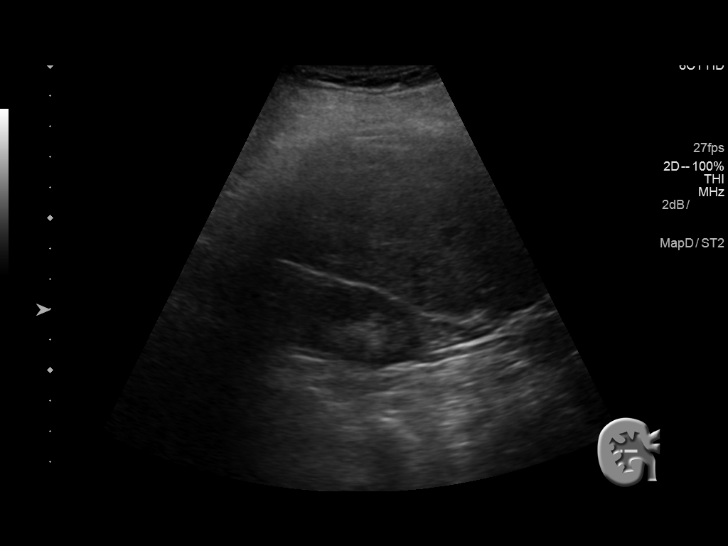
[im 14/36]
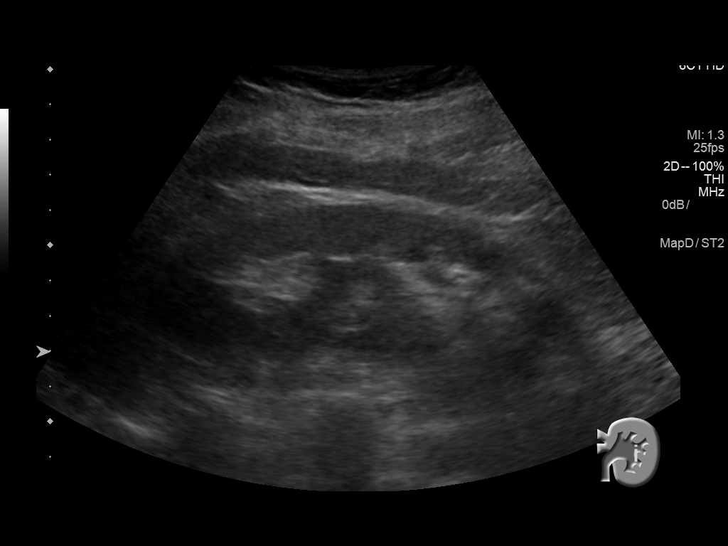
[im 17/36]
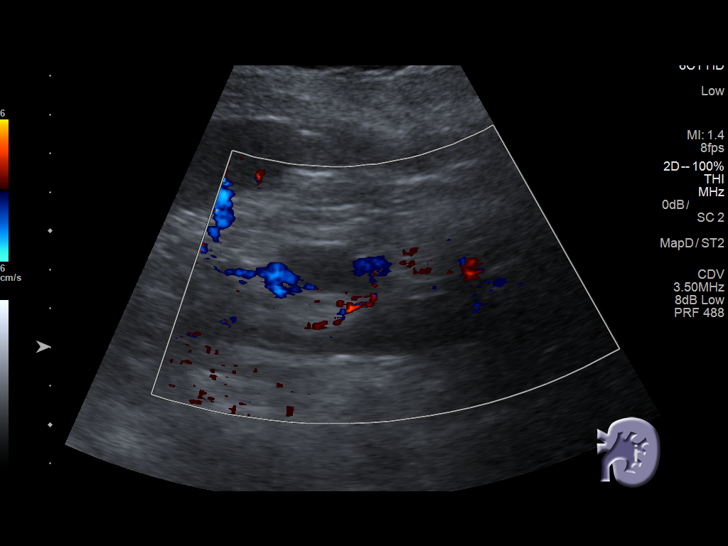
[im 19/36]
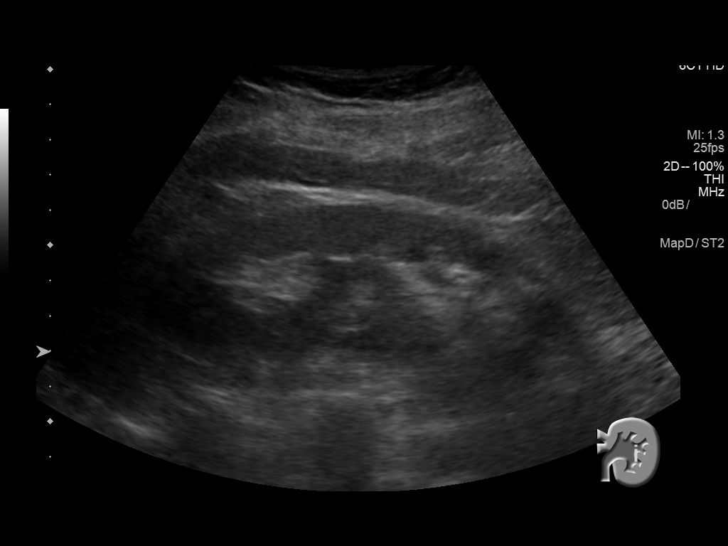
[im 22/36]
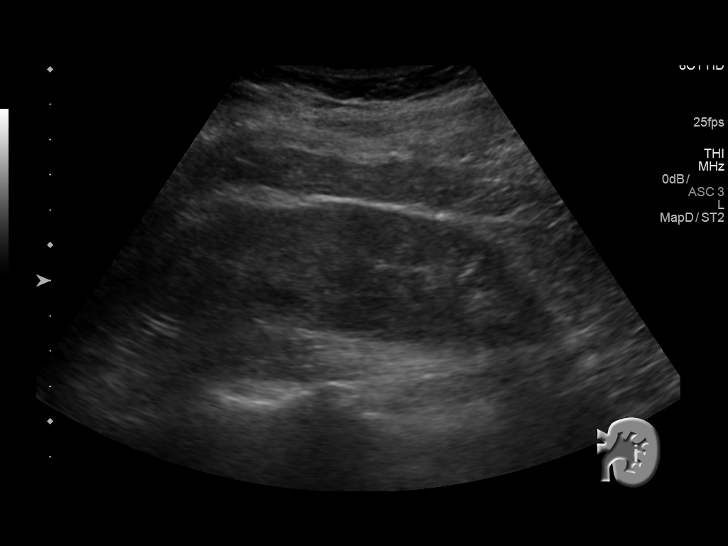
[im 24/36]
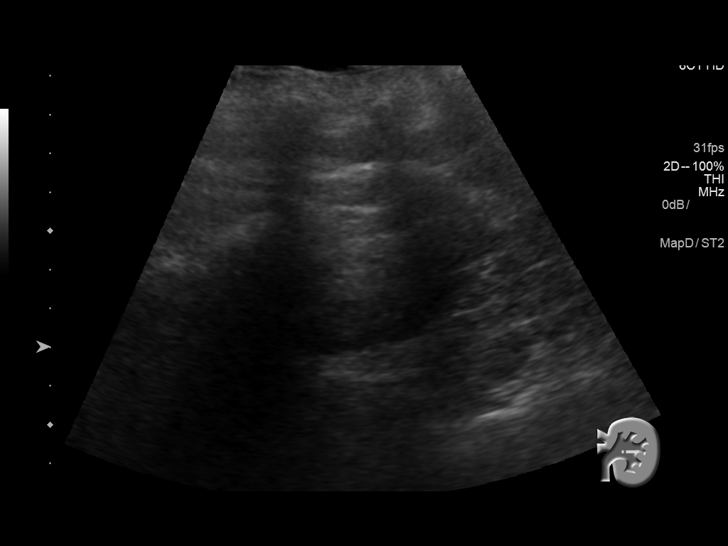
[im 27/36]
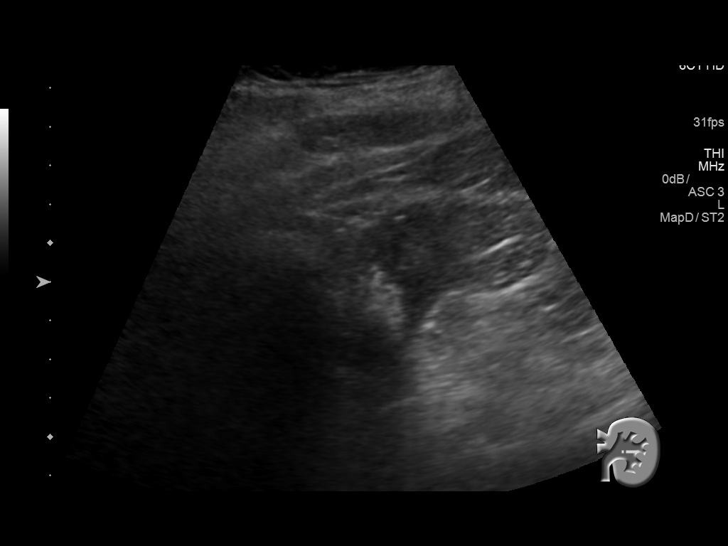
[im 30/36]
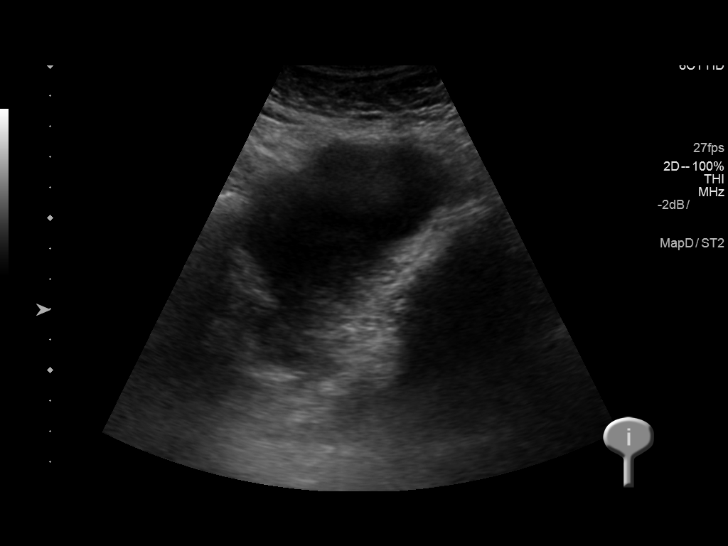
[im 33/36]
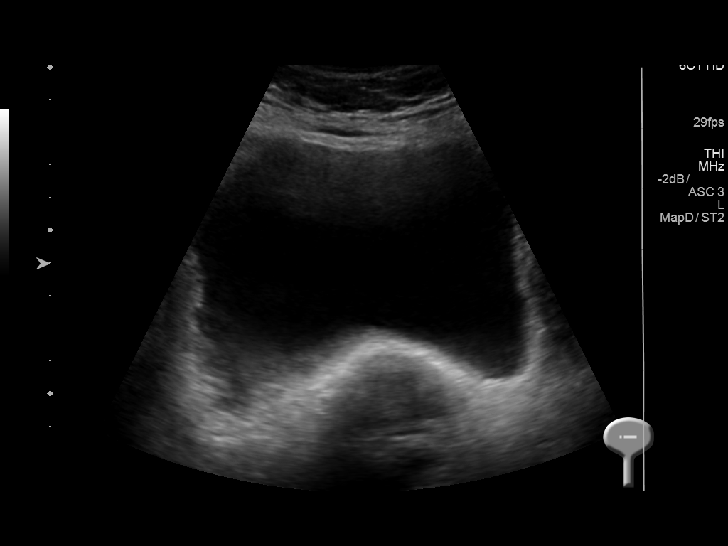
[im 36/36]
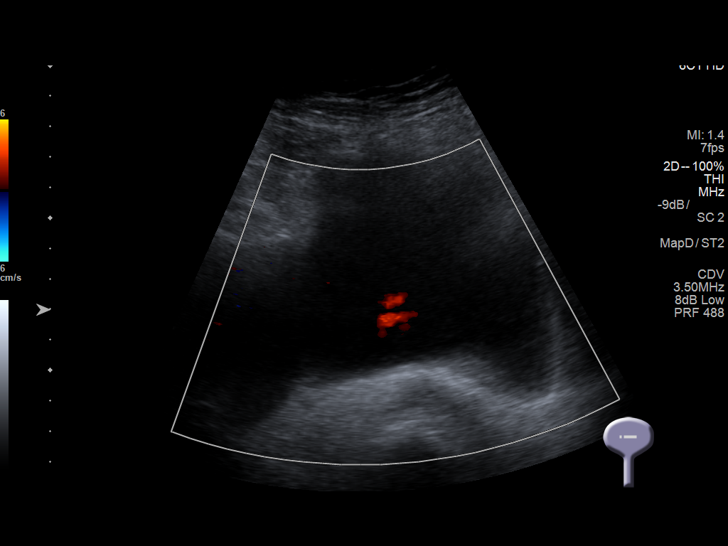

[14 of 25 positions shown; findings below may reference images not displayed]

FINDINGS: Right Kidney:

Length: 9.9 cm. Echogenicity within normal limits. No mass or
hydronephrosis visualized.

Left Kidney:

Length: 11.5 cm. Echogenicity within normal limits. Slightly rotated
left kidney. Extra renal pelvis without calyceal dilation to suggest
hydronephrosis. No renal mass noted.

Bladder:

Appears normal for degree of bladder distention.
IMPRESSION: Rotated left kidney with extrarenal pelvis.  No hydronephrosis.

## 2019-11-15 IMAGING — DX DG HIP (WITH OR WITHOUT PELVIS) 2-3V*L*
3 series · 3 of 3 positions shown · non-contrast
Comparison: None.

CLINICAL DATA: Left hip pain, no known injury, initial encounter

EXAM:
DG HIP (WITH OR WITHOUT PELVIS) 3V LEFT

[pelvis ap]
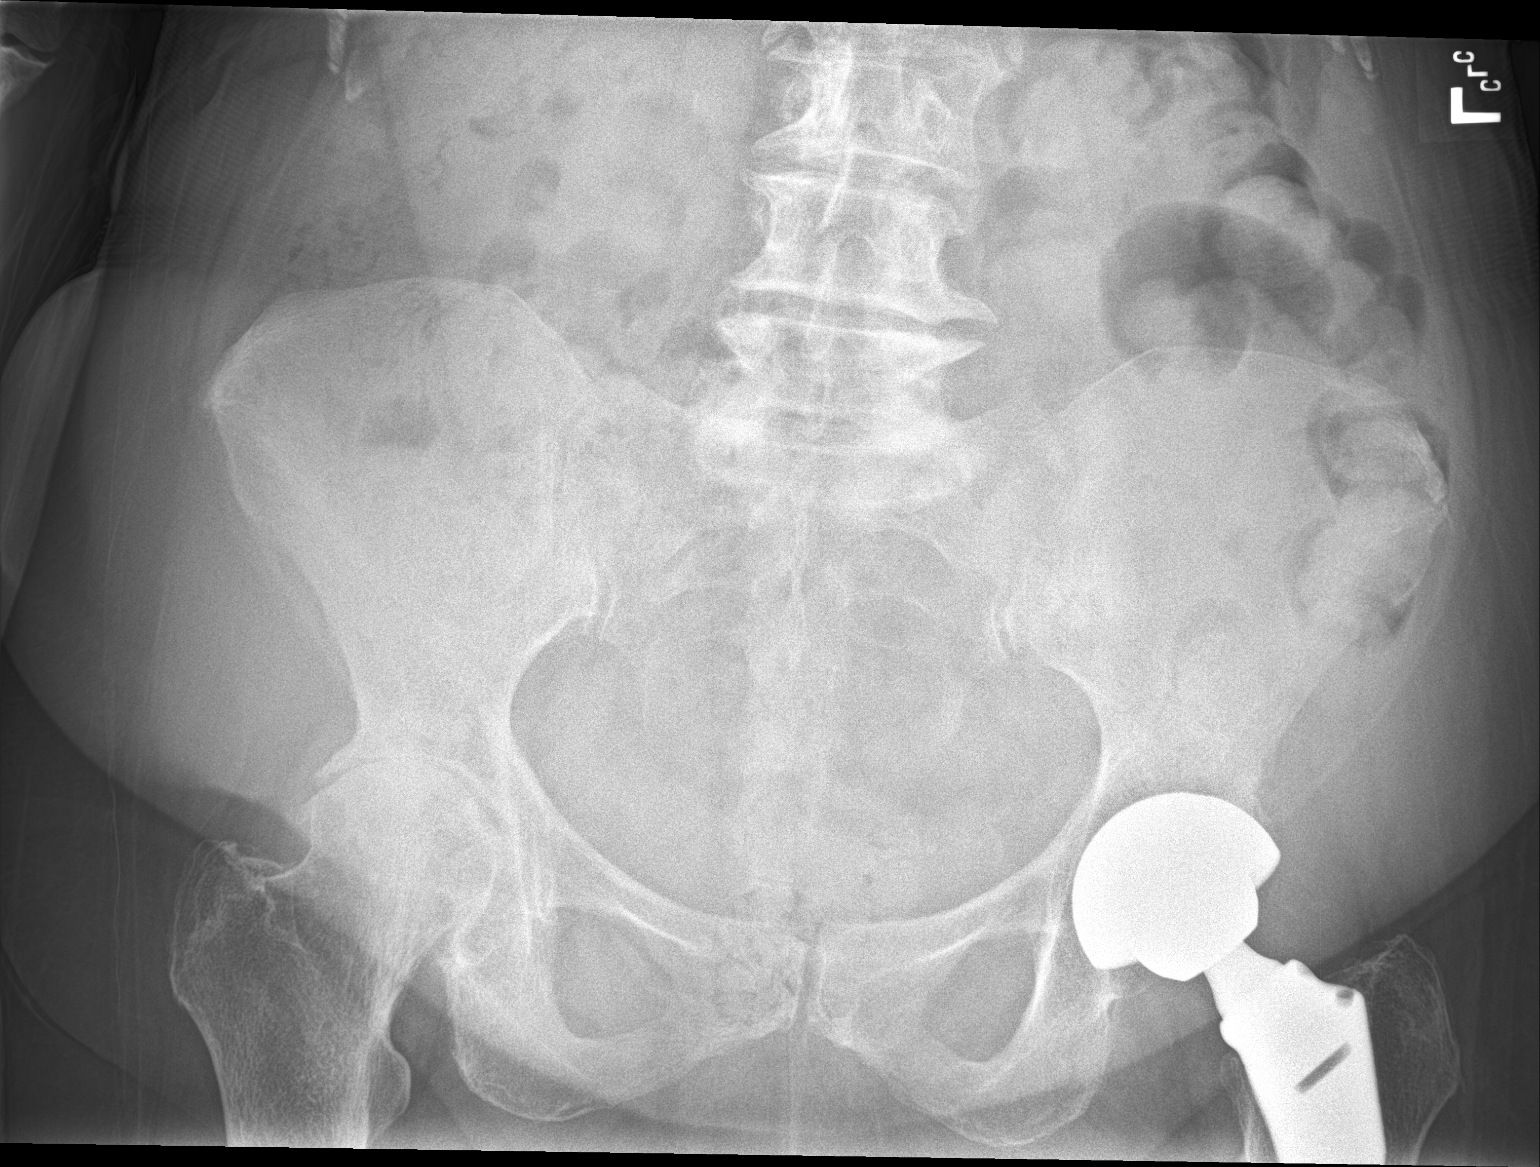

[hip ap]
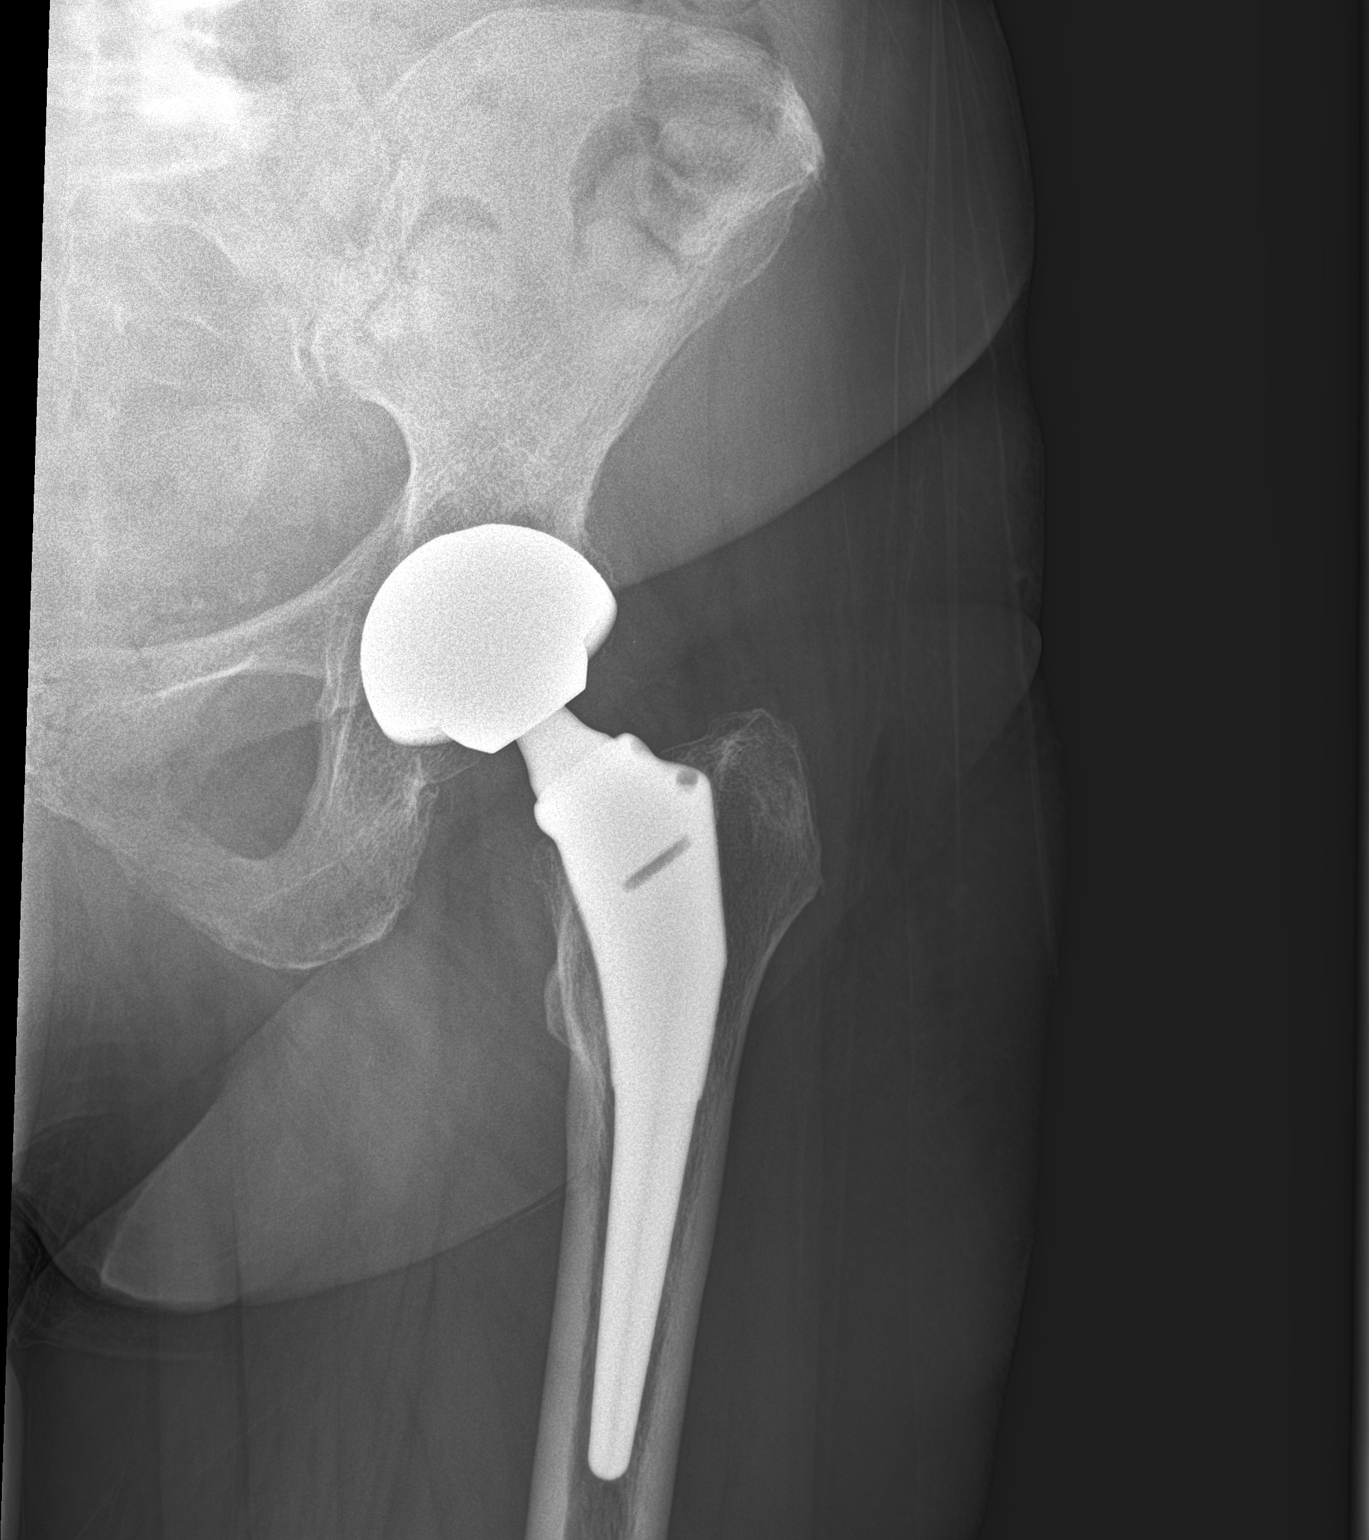

[hip lat]
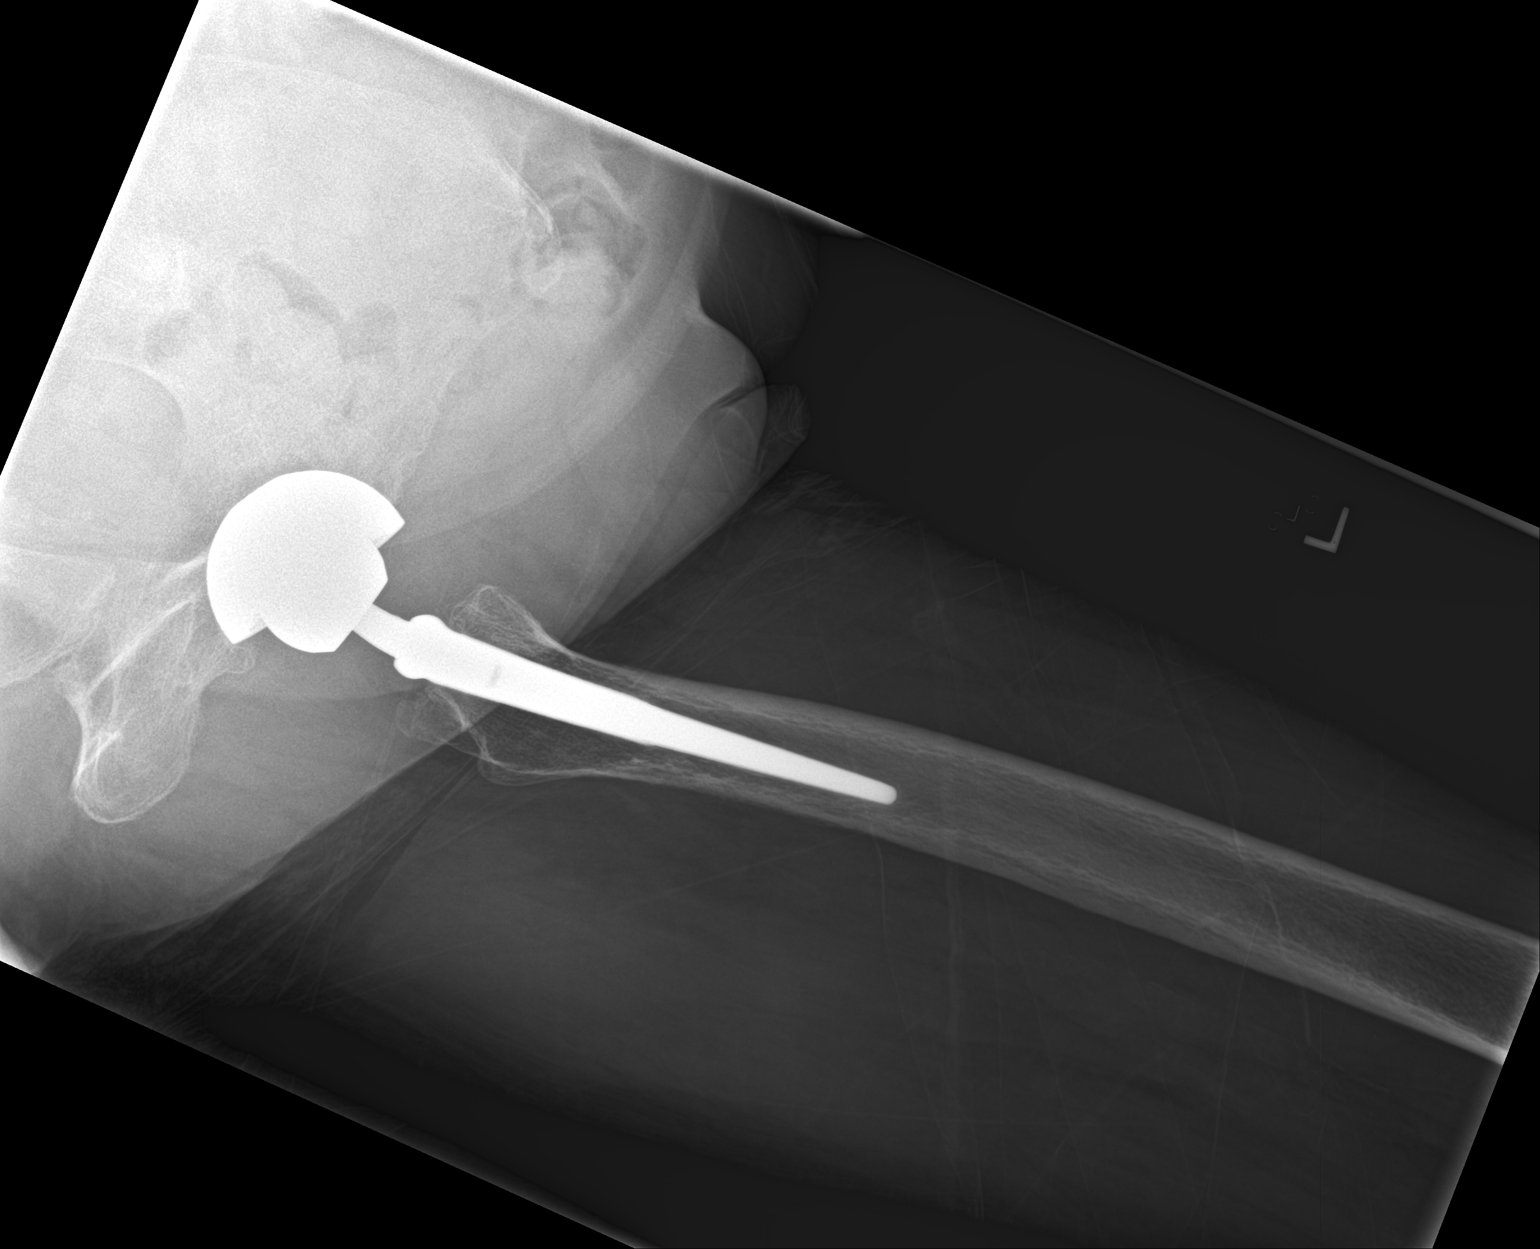

[3 of 3 positions shown; findings below may reference images not displayed]

FINDINGS: Pelvic ring is intact. Degenerative changes of the right hip joint
and lumbar spine are seen. Postsurgical changes with left hip
prosthesis are seen. No fracture or dislocation is noted. No
definitive loosening is seen. No soft tissue abnormality is noted.
IMPRESSION: Status post left hip replacement without acute abnormality.

## 2019-11-22 DIAGNOSIS — D3131 Benign neoplasm of right choroid: Secondary | ICD-10-CM | POA: Diagnosis not present

## 2019-11-22 DIAGNOSIS — H40013 Open angle with borderline findings, low risk, bilateral: Secondary | ICD-10-CM | POA: Diagnosis not present

## 2019-11-22 DIAGNOSIS — E119 Type 2 diabetes mellitus without complications: Secondary | ICD-10-CM | POA: Diagnosis not present

## 2019-11-22 DIAGNOSIS — Z961 Presence of intraocular lens: Secondary | ICD-10-CM | POA: Diagnosis not present

## 2019-11-27 DIAGNOSIS — L281 Prurigo nodularis: Secondary | ICD-10-CM | POA: Diagnosis not present

## 2019-12-12 DIAGNOSIS — I10 Essential (primary) hypertension: Secondary | ICD-10-CM | POA: Diagnosis not present

## 2019-12-12 DIAGNOSIS — E118 Type 2 diabetes mellitus with unspecified complications: Secondary | ICD-10-CM | POA: Diagnosis not present

## 2019-12-12 DIAGNOSIS — E7800 Pure hypercholesterolemia, unspecified: Secondary | ICD-10-CM | POA: Diagnosis not present

## 2019-12-12 DIAGNOSIS — F322 Major depressive disorder, single episode, severe without psychotic features: Secondary | ICD-10-CM | POA: Diagnosis not present

## 2020-02-23 DIAGNOSIS — R300 Dysuria: Secondary | ICD-10-CM | POA: Diagnosis not present

## 2020-02-23 DIAGNOSIS — F322 Major depressive disorder, single episode, severe without psychotic features: Secondary | ICD-10-CM | POA: Diagnosis not present

## 2020-02-23 DIAGNOSIS — E7800 Pure hypercholesterolemia, unspecified: Secondary | ICD-10-CM | POA: Diagnosis not present

## 2020-02-23 DIAGNOSIS — E118 Type 2 diabetes mellitus with unspecified complications: Secondary | ICD-10-CM | POA: Diagnosis not present

## 2020-02-23 DIAGNOSIS — I10 Essential (primary) hypertension: Secondary | ICD-10-CM | POA: Diagnosis not present

## 2020-02-27 ENCOUNTER — Ambulatory Visit: Payer: Medicare Other | Admitting: Orthopedic Surgery

## 2020-02-27 ENCOUNTER — Ambulatory Visit: Payer: Self-pay

## 2020-02-27 ENCOUNTER — Encounter: Payer: Self-pay | Admitting: Orthopedic Surgery

## 2020-02-27 VITALS — Ht 62.0 in | Wt 150.0 lb

## 2020-02-27 DIAGNOSIS — M25552 Pain in left hip: Secondary | ICD-10-CM

## 2020-02-27 DIAGNOSIS — M25551 Pain in right hip: Secondary | ICD-10-CM | POA: Diagnosis not present

## 2020-02-27 DIAGNOSIS — M5441 Lumbago with sciatica, right side: Secondary | ICD-10-CM | POA: Diagnosis not present

## 2020-02-27 DIAGNOSIS — G8929 Other chronic pain: Secondary | ICD-10-CM | POA: Diagnosis not present

## 2020-02-27 MED ORDER — GABAPENTIN 100 MG PO CAPS: 100.0000 mg | ORAL_CAPSULE | Freq: Every day | ORAL | 1 refills | Status: DC

## 2020-02-27 MED ORDER — PREDNISONE 10 MG PO TABS: 10.0000 mg | ORAL_TABLET | Freq: Every day | ORAL | 0 refills | Status: DC

## 2020-02-29 ENCOUNTER — Encounter: Payer: Self-pay | Admitting: Orthopedic Surgery

## 2020-02-29 NOTE — Progress Notes (Signed)
Office Visit Note   Patient: [name] [name]           Date of Birth: 01-06-1932           MRN: [id number] Visit Date: 02/27/2020              Requested by: No referring provider defined for this encounter. PCP: Baruch Gouty, FNP (Inactive)  Chief Complaint  Patient presents with  . Right Hip - Pain  . Left Hip - Pain    Hx THR 2015  . Lower Back - Pain      HPI: Patient is an 86 year old woman presents with bilateral hip pain status post a left total hip arthroplasty in 2015 she states she has radicular pain down the right lower extremity and lower back pain with pain with ambulation.  Assessment & Plan: Visit Diagnoses:  1. Bilateral hip pain   2. Chronic right-sided low back pain with right-sided sciatica     Plan: Patient has advanced arthritis of her lumbar spine and right hip.  Have recommended that she take Neurontin 100 mg at night and prednisone 10 mg with breakfast.  Follow-Up Instructions: Return in about 3 weeks (around 03/19/2020).   Ortho Exam  Patient is alert, oriented, no adenopathy, well-dressed, normal affect, normal respiratory effort. Examination patient has a negative straight leg raise bilaterally no focal motor weakness in either lower extremity she has an antalgic gait and uses assistance for walking she has essentially no range of motion of the right hip good range of motion of the left hip she has a negative straight leg raise bilaterally with motor strength symmetric in both lower extremities.  Imaging: No results found. No images are attached to the encounter.  Labs: Lab Results  Component Value Date   HGBA1C 6.3 02/03/2019   HGBA1C 6.0 05/02/2018   HGBA1C 6.3 12/06/2017   REPTSTATUS 09/05/2017 FINAL 09/03/2017   CULT (A) 09/03/2017    >=100,000 COLONIES/mL DIPHTHEROIDS(CORYNEBACTERIUM SPECIES) Standardized susceptibility testing for this organism is not available. Performed at [facility], [place] [address], [place], [place]  [address]      Lab Results  Component Value Date   ALBUMIN 4.5 04/21/2019   ALBUMIN 4.5 02/03/2019   ALBUMIN 4.5 09/29/2018    Lab Results  Component Value Date   MG 1.9 03/07/2014   Lab Results  Component Value Date   VD25OH 48.5 04/30/2018   VD25OH 50.2 12/06/2017   VD25OH 41.7 07/12/2017    No results found for: PREALBUMIN CBC EXTENDED Latest Ref Rng & Units 04/28/2019 02/03/2019 04/30/2018  WBC 4.0 - 10.5 K/uL 10.5 7.1 5.6  RBC 3.87 - 5.11 MIL/uL 4.82 4.83 4.23  HGB 12.0 - 15.0 g/dL 14.8 14.7 13.7  HCT 36 - 46 % 44.9 43.4 39.1  PLT 150 - 400 K/uL 363 324 281  NEUTROABS 1.40 - 7.00 x10E3/uL - 4.2 3.3  LYMPHSABS 0 - 3 x10E3/uL - 2.3 1.7     Body mass index is 27.44 kg/m.  Orders:  Orders Placed This Encounter  Procedures  . XR HIPS BILAT W OR W/O PELVIS 2V  . XR Lumbar Spine 2-3 Views   Meds ordered this encounter  Medications  . predniSONE (DELTASONE) 10 MG tablet    Sig: Take 1 tablet (10 mg total) by mouth daily with breakfast.    Dispense:  30 tablet    Refill:  0  . gabapentin (NEURONTIN) 100 MG capsule    Sig: Take 1 capsule (100  mg total) by mouth at bedtime.    Dispense:  30 capsule    Refill:  1     Procedures: No procedures performed  Clinical Data: No additional findings.  ROS:  All other systems negative, except as noted in the HPI. Review of Systems  Objective: Vital Signs: Ht 5\' 2"  (1.575 m)   Wt 150 lb (68 kg)   BMI 27.44 kg/m   Specialty Comments:  No specialty comments available.  PMFS History: Patient Active Problem List   Diagnosis Date Noted  . Hypertension associated with diabetes ([place]) 02/20/2019  . Hyperlipidemia associated with type 2 diabetes mellitus ([place]) 02/20/2019  . Depression, recurrent ([place]) 02/20/2019  . Type 2 diabetes mellitus without complication, without long-term current use of insulin ([place]) 09/26/2015  . COPD (chronic obstructive pulmonary disease) ([place]) 05/29/2015  . Dermatographia 05/16/2015  .  Rectal bleeding 03/07/2014  . History of total left hip arthroplasty 01/25/2014  . Edema 01/02/2014  . Hypoxia 01/02/2014  . S/P total hip arthroplasty 12/27/2013  . Anxiety   . Depression with anxiety   . Rhinitis   . Atrophic vaginitis   . Postmenopausal    Past Medical History:  Diagnosis Date  . Anxiety   . Arthritis   . Atrophic vaginitis   . Cataract   . COPD (chronic obstructive pulmonary disease) ([place])   . Depressive disorder, not elsewhere classified   . Diabetes mellitus without complication ([place])   . Disorder of bone and cartilage, unspecified   . Elevated blood sugar   . Emphysema lung ([place])   . Hypertension   . Other and unspecified hyperlipidemia   . Postmenopausal   . Rhinitis     Family History  Problem Relation Age of Onset  . Arthritis Mother   . COPD Father   . Diabetes Father   . Aneurysm Sister   . Diabetes Sister   . Diabetes Sister   . Hypertension Sister   . COPD Daughter   . Fibromyalgia Daughter   . Healthy Daughter     Past Surgical History:  Procedure Laterality Date  . APPENDECTOMY  1957  . CATARACT EXTRACTION W/ INTRAOCULAR LENS IMPLANT Bilateral 2005   [place]  . COLONOSCOPY N/A 03/08/2014   Procedure: COLONOSCOPY;  Surgeon: Rogene Houston, MD;  [facility];  Service: Endoscopy;  Laterality: N/A;  . EYE SURGERY    . JOINT REPLACEMENT    . TOTAL HIP ARTHROPLASTY Left 12/27/2013   Procedure: LEFT TOTAL HIP ARTHROPLASTY;  Surgeon: Newt Minion, MD;  Location: [facility];  Service: Orthopedics;  Laterality: Left;   Social History   Occupational History  . 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 Smoking status: Never Smoker  . Smokeless tobacco: Never Used  Vaping Use  . Vaping Use: Never used  Substance and Sexual Activity  . Alcohol use: No    Alcohol/week: 0.0 standard drinks  . Drug use: No  . Sexual activity: Not Currently

## 2020-03-01 DIAGNOSIS — E118 Type 2 diabetes mellitus with unspecified complications: Secondary | ICD-10-CM | POA: Diagnosis not present

## 2020-03-01 DIAGNOSIS — E7800 Pure hypercholesterolemia, unspecified: Secondary | ICD-10-CM | POA: Diagnosis not present

## 2020-03-01 DIAGNOSIS — F322 Major depressive disorder, single episode, severe without psychotic features: Secondary | ICD-10-CM | POA: Diagnosis not present

## 2020-03-01 DIAGNOSIS — I10 Essential (primary) hypertension: Secondary | ICD-10-CM | POA: Diagnosis not present

## 2020-03-19 ENCOUNTER — Encounter: Payer: Self-pay | Admitting: Orthopedic Surgery

## 2020-03-19 ENCOUNTER — Ambulatory Visit: Payer: Medicare Other | Admitting: Physician Assistant

## 2020-03-19 DIAGNOSIS — M25552 Pain in left hip: Secondary | ICD-10-CM | POA: Diagnosis not present

## 2020-03-19 DIAGNOSIS — M25551 Pain in right hip: Secondary | ICD-10-CM

## 2020-03-19 NOTE — Progress Notes (Signed)
Office Visit Note   Patient: [name] [name]           Date of Birth: Dec 23, 1931           MRN: [id number] Visit Date: 03/19/2020              Requested by: No referring provider defined for this encounter. PCP: Baruch Gouty, FNP (Inactive)  Chief Complaint  Patient presents with  . Lower Back - Follow-up    Says meds raised her blood sugars and she hopes that she doesn't have to take it anymore  . Right Hip - Follow-up  . Left Hip - Follow-up      HPI: Patient is a pleasant 86 year old woman who is in follow-up today on her back pain.  At her last visit she was prescribed 10 mg prednisone daily.  This was secondary to findings of advanced arthritis in her spine.  She does feel a lot better.  She does not want to stay on the prednisone however because it has significantly affected her blood sugars  Assessment & Plan: Visit Diagnoses: No diagnosis found.  Plan: Patient will wean off the prednisone in the next 4 days and have given her instructions on how to do this.  She may follow-up as needed.  We discussed that if she had return of symptoms perhaps we could try a regular anti-inflammatory.  Follow-Up Instructions: No follow-ups on file.   Ortho Exam  Patient is alert, oriented, no adenopathy, well-dressed, normal affect, normal respiratory effort. Lower back she says equal strength with plantar flexion and dorsiflexion.  Negative straight leg raise bilaterally.  Minimally to nontender to palpation.  Imaging: No results found. No images are attached to the encounter.  Labs: Lab Results  Component Value Date   HGBA1C 6.3 02/03/2019   HGBA1C 6.0 05/02/2018   HGBA1C 6.3 12/06/2017   REPTSTATUS 09/05/2017 FINAL 09/03/2017   CULT (A) 09/03/2017    >=100,000 COLONIES/mL DIPHTHEROIDS(CORYNEBACTERIUM SPECIES) Standardized susceptibility testing for this organism is not available. Performed at [facility], [place] [address], [place], [place] [address]      Lab  Results  Component Value Date   ALBUMIN 4.5 04/21/2019   ALBUMIN 4.5 02/03/2019   ALBUMIN 4.5 09/29/2018    Lab Results  Component Value Date   MG 1.9 03/07/2014   Lab Results  Component Value Date   VD25OH 48.5 04/30/2018   VD25OH 50.2 12/06/2017   VD25OH 41.7 07/12/2017    No results found for: PREALBUMIN CBC EXTENDED Latest Ref Rng & Units 04/28/2019 02/03/2019 04/30/2018  WBC 4.0 - 10.5 K/uL 10.5 7.1 5.6  RBC 3.87 - 5.11 MIL/uL 4.82 4.83 4.23  HGB 12.0 - 15.0 g/dL 14.8 14.7 13.7  HCT 36 - 46 % 44.9 43.4 39.1  PLT 150 - 400 K/uL 363 324 281  NEUTROABS 1.40 - 7.00 x10E3/uL - 4.2 3.3  LYMPHSABS 0 - 3 x10E3/uL - 2.3 1.7     There is no height or weight on file to calculate BMI.  Orders:  No orders of the defined types were placed in this encounter.  No orders of the defined types were placed in this encounter.    Procedures: No procedures performed  Clinical Data: No additional findings.  ROS:  All other systems negative, except as noted in the HPI. Review of Systems  Objective: Vital Signs: There were no vitals taken for this visit.  Specialty Comments:  No specialty comments available.  PMFS History: Patient Active  Problem List   Diagnosis Date Noted  . Hypertension associated with diabetes ([place]) 02/20/2019  . Hyperlipidemia associated with type 2 diabetes mellitus ([place]) 02/20/2019  . Depression, recurrent ([place]) 02/20/2019  . Type 2 diabetes mellitus without complication, without long-term current use of insulin ([place]) 09/26/2015  . COPD (chronic obstructive pulmonary disease) ([place]) 05/29/2015  . Dermatographia 05/16/2015  . Rectal bleeding 03/07/2014  . History of total left hip arthroplasty 01/25/2014  . Edema 01/02/2014  . Hypoxia 01/02/2014  . S/P total hip arthroplasty 12/27/2013  . Anxiety   . Depression with anxiety   . Rhinitis   . Atrophic vaginitis   . Postmenopausal    Past Medical History:  Diagnosis Date  . Anxiety   .  Arthritis   . Atrophic vaginitis   . Cataract   . COPD (chronic obstructive pulmonary disease) ([place])   . Depressive disorder, not elsewhere classified   . Diabetes mellitus without complication ([place])   . Disorder of bone and cartilage, unspecified   . Elevated blood sugar   . Emphysema lung ([place])   . Hypertension   . Other and unspecified hyperlipidemia   . Postmenopausal   . Rhinitis     Family History  Problem Relation Age of Onset  . Arthritis Mother   . COPD Father   . Diabetes Father   . Aneurysm Sister   . Diabetes Sister   . Diabetes Sister   . Hypertension Sister   . COPD Daughter   . Fibromyalgia Daughter   . Healthy Daughter     Past Surgical History:  Procedure Laterality Date  . APPENDECTOMY  1957  . CATARACT EXTRACTION W/ INTRAOCULAR LENS IMPLANT Bilateral 2005   [place]  . COLONOSCOPY N/A 03/08/2014   Procedure: COLONOSCOPY;  Surgeon: Rogene Houston, MD;  [facility];  Service: Endoscopy;  Laterality: N/A;  . EYE SURGERY    . JOINT REPLACEMENT    . TOTAL HIP ARTHROPLASTY Left 12/27/2013   Procedure: LEFT TOTAL HIP ARTHROPLASTY;  Surgeon: Newt Minion, MD;  Location: [facility];  Service: Orthopedics;  Laterality: Left;   Social History   Occupational History  . 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 Smoking status: Never Smoker  . Smokeless tobacco: Never Used  Vaping Use  . Vaping Use: Never used  Substance and Sexual Activity  . Alcohol use: No    Alcohol/week: 0.0 standard drinks  . Drug use: No  . Sexual activity: Not Currently

## 2020-04-07 DIAGNOSIS — E7800 Pure hypercholesterolemia, unspecified: Secondary | ICD-10-CM | POA: Diagnosis not present

## 2020-04-07 DIAGNOSIS — I10 Essential (primary) hypertension: Secondary | ICD-10-CM | POA: Diagnosis not present

## 2020-04-07 DIAGNOSIS — E118 Type 2 diabetes mellitus with unspecified complications: Secondary | ICD-10-CM | POA: Diagnosis not present

## 2020-04-07 DIAGNOSIS — F322 Major depressive disorder, single episode, severe without psychotic features: Secondary | ICD-10-CM | POA: Diagnosis not present

## 2020-04-25 DIAGNOSIS — E118 Type 2 diabetes mellitus with unspecified complications: Secondary | ICD-10-CM | POA: Diagnosis not present

## 2020-04-25 DIAGNOSIS — E7800 Pure hypercholesterolemia, unspecified: Secondary | ICD-10-CM | POA: Diagnosis not present

## 2020-04-25 DIAGNOSIS — I10 Essential (primary) hypertension: Secondary | ICD-10-CM | POA: Diagnosis not present

## 2020-04-25 DIAGNOSIS — F322 Major depressive disorder, single episode, severe without psychotic features: Secondary | ICD-10-CM | POA: Diagnosis not present

## 2020-06-10 DIAGNOSIS — F322 Major depressive disorder, single episode, severe without psychotic features: Secondary | ICD-10-CM | POA: Diagnosis not present

## 2020-06-10 DIAGNOSIS — I10 Essential (primary) hypertension: Secondary | ICD-10-CM | POA: Diagnosis not present

## 2020-06-10 DIAGNOSIS — E7800 Pure hypercholesterolemia, unspecified: Secondary | ICD-10-CM | POA: Diagnosis not present

## 2020-06-10 DIAGNOSIS — E118 Type 2 diabetes mellitus with unspecified complications: Secondary | ICD-10-CM | POA: Diagnosis not present

## 2020-06-30 DIAGNOSIS — I10 Essential (primary) hypertension: Secondary | ICD-10-CM | POA: Diagnosis not present

## 2020-06-30 DIAGNOSIS — E118 Type 2 diabetes mellitus with unspecified complications: Secondary | ICD-10-CM | POA: Diagnosis not present

## 2020-06-30 DIAGNOSIS — F322 Major depressive disorder, single episode, severe without psychotic features: Secondary | ICD-10-CM | POA: Diagnosis not present

## 2020-06-30 DIAGNOSIS — J449 Chronic obstructive pulmonary disease, unspecified: Secondary | ICD-10-CM | POA: Diagnosis not present

## 2020-08-04 DIAGNOSIS — E118 Type 2 diabetes mellitus with unspecified complications: Secondary | ICD-10-CM | POA: Diagnosis not present

## 2020-08-04 DIAGNOSIS — E7800 Pure hypercholesterolemia, unspecified: Secondary | ICD-10-CM | POA: Diagnosis not present

## 2020-08-04 DIAGNOSIS — I10 Essential (primary) hypertension: Secondary | ICD-10-CM | POA: Diagnosis not present

## 2020-08-04 DIAGNOSIS — F322 Major depressive disorder, single episode, severe without psychotic features: Secondary | ICD-10-CM | POA: Diagnosis not present

## 2020-08-16 DIAGNOSIS — S91109A Unspecified open wound of unspecified toe(s) without damage to nail, initial encounter: Secondary | ICD-10-CM | POA: Diagnosis not present

## 2020-08-22 DIAGNOSIS — I10 Essential (primary) hypertension: Secondary | ICD-10-CM | POA: Diagnosis not present

## 2020-08-22 DIAGNOSIS — F322 Major depressive disorder, single episode, severe without psychotic features: Secondary | ICD-10-CM | POA: Diagnosis not present

## 2020-08-22 DIAGNOSIS — J449 Chronic obstructive pulmonary disease, unspecified: Secondary | ICD-10-CM | POA: Diagnosis not present

## 2020-08-22 DIAGNOSIS — E118 Type 2 diabetes mellitus with unspecified complications: Secondary | ICD-10-CM | POA: Diagnosis not present

## 2020-09-03 DIAGNOSIS — I10 Essential (primary) hypertension: Secondary | ICD-10-CM | POA: Diagnosis not present

## 2020-09-03 DIAGNOSIS — E118 Type 2 diabetes mellitus with unspecified complications: Secondary | ICD-10-CM | POA: Diagnosis not present

## 2020-09-03 DIAGNOSIS — E7800 Pure hypercholesterolemia, unspecified: Secondary | ICD-10-CM | POA: Diagnosis not present

## 2020-09-03 DIAGNOSIS — F322 Major depressive disorder, single episode, severe without psychotic features: Secondary | ICD-10-CM | POA: Diagnosis not present

## 2020-09-23 DIAGNOSIS — E118 Type 2 diabetes mellitus with unspecified complications: Secondary | ICD-10-CM | POA: Diagnosis not present

## 2020-09-23 DIAGNOSIS — I10 Essential (primary) hypertension: Secondary | ICD-10-CM | POA: Diagnosis not present

## 2020-09-23 DIAGNOSIS — E7800 Pure hypercholesterolemia, unspecified: Secondary | ICD-10-CM | POA: Diagnosis not present

## 2020-09-23 DIAGNOSIS — J449 Chronic obstructive pulmonary disease, unspecified: Secondary | ICD-10-CM | POA: Diagnosis not present

## 2020-10-29 DIAGNOSIS — F322 Major depressive disorder, single episode, severe without psychotic features: Secondary | ICD-10-CM | POA: Diagnosis not present

## 2020-10-29 DIAGNOSIS — E118 Type 2 diabetes mellitus with unspecified complications: Secondary | ICD-10-CM | POA: Diagnosis not present

## 2020-10-29 DIAGNOSIS — E7800 Pure hypercholesterolemia, unspecified: Secondary | ICD-10-CM | POA: Diagnosis not present

## 2020-10-29 DIAGNOSIS — I10 Essential (primary) hypertension: Secondary | ICD-10-CM | POA: Diagnosis not present

## 2020-11-27 DIAGNOSIS — E7800 Pure hypercholesterolemia, unspecified: Secondary | ICD-10-CM | POA: Diagnosis not present

## 2020-11-27 DIAGNOSIS — F322 Major depressive disorder, single episode, severe without psychotic features: Secondary | ICD-10-CM | POA: Diagnosis not present

## 2020-11-27 DIAGNOSIS — E1169 Type 2 diabetes mellitus with other specified complication: Secondary | ICD-10-CM | POA: Diagnosis not present

## 2020-11-27 DIAGNOSIS — I10 Essential (primary) hypertension: Secondary | ICD-10-CM | POA: Diagnosis not present

## 2020-12-01 DIAGNOSIS — E7800 Pure hypercholesterolemia, unspecified: Secondary | ICD-10-CM | POA: Diagnosis not present

## 2020-12-01 DIAGNOSIS — I10 Essential (primary) hypertension: Secondary | ICD-10-CM | POA: Diagnosis not present

## 2020-12-01 DIAGNOSIS — E118 Type 2 diabetes mellitus with unspecified complications: Secondary | ICD-10-CM | POA: Diagnosis not present

## 2020-12-01 DIAGNOSIS — F322 Major depressive disorder, single episode, severe without psychotic features: Secondary | ICD-10-CM | POA: Diagnosis not present

## 2020-12-03 DIAGNOSIS — E119 Type 2 diabetes mellitus without complications: Secondary | ICD-10-CM | POA: Diagnosis not present

## 2020-12-03 DIAGNOSIS — H40013 Open angle with borderline findings, low risk, bilateral: Secondary | ICD-10-CM | POA: Diagnosis not present

## 2020-12-03 DIAGNOSIS — H43811 Vitreous degeneration, right eye: Secondary | ICD-10-CM | POA: Diagnosis not present

## 2020-12-03 DIAGNOSIS — H5000 Unspecified esotropia: Secondary | ICD-10-CM | POA: Diagnosis not present

## 2021-02-10 DIAGNOSIS — G4700 Insomnia, unspecified: Secondary | ICD-10-CM | POA: Diagnosis not present

## 2021-02-10 DIAGNOSIS — I10 Essential (primary) hypertension: Secondary | ICD-10-CM | POA: Diagnosis not present

## 2021-02-10 DIAGNOSIS — E118 Type 2 diabetes mellitus with unspecified complications: Secondary | ICD-10-CM | POA: Diagnosis not present

## 2021-02-10 DIAGNOSIS — E7800 Pure hypercholesterolemia, unspecified: Secondary | ICD-10-CM | POA: Diagnosis not present

## 2021-02-26 DIAGNOSIS — Z0000 Encounter for general adult medical examination without abnormal findings: Secondary | ICD-10-CM | POA: Diagnosis not present

## 2021-02-26 DIAGNOSIS — Z7984 Long term (current) use of oral hypoglycemic drugs: Secondary | ICD-10-CM | POA: Diagnosis not present

## 2021-02-26 DIAGNOSIS — E7800 Pure hypercholesterolemia, unspecified: Secondary | ICD-10-CM | POA: Diagnosis not present

## 2021-02-26 DIAGNOSIS — E1169 Type 2 diabetes mellitus with other specified complication: Secondary | ICD-10-CM | POA: Diagnosis not present

## 2021-02-26 DIAGNOSIS — I10 Essential (primary) hypertension: Secondary | ICD-10-CM | POA: Diagnosis not present

## 2021-04-04 DIAGNOSIS — E118 Type 2 diabetes mellitus with unspecified complications: Secondary | ICD-10-CM | POA: Diagnosis not present

## 2021-04-04 DIAGNOSIS — I10 Essential (primary) hypertension: Secondary | ICD-10-CM | POA: Diagnosis not present

## 2021-04-04 DIAGNOSIS — E1169 Type 2 diabetes mellitus with other specified complication: Secondary | ICD-10-CM | POA: Diagnosis not present

## 2021-04-04 DIAGNOSIS — E7800 Pure hypercholesterolemia, unspecified: Secondary | ICD-10-CM | POA: Diagnosis not present

## 2021-05-28 DIAGNOSIS — E1169 Type 2 diabetes mellitus with other specified complication: Secondary | ICD-10-CM | POA: Diagnosis not present

## 2021-05-28 DIAGNOSIS — J449 Chronic obstructive pulmonary disease, unspecified: Secondary | ICD-10-CM | POA: Diagnosis not present

## 2021-05-28 DIAGNOSIS — E7800 Pure hypercholesterolemia, unspecified: Secondary | ICD-10-CM | POA: Diagnosis not present

## 2021-05-28 DIAGNOSIS — I10 Essential (primary) hypertension: Secondary | ICD-10-CM | POA: Diagnosis not present

## 2021-06-09 DIAGNOSIS — J449 Chronic obstructive pulmonary disease, unspecified: Secondary | ICD-10-CM | POA: Diagnosis not present

## 2021-06-09 DIAGNOSIS — E1169 Type 2 diabetes mellitus with other specified complication: Secondary | ICD-10-CM | POA: Diagnosis not present

## 2021-06-09 DIAGNOSIS — I10 Essential (primary) hypertension: Secondary | ICD-10-CM | POA: Diagnosis not present

## 2021-06-09 DIAGNOSIS — E7800 Pure hypercholesterolemia, unspecified: Secondary | ICD-10-CM | POA: Diagnosis not present

## 2021-07-10 DIAGNOSIS — L03119 Cellulitis of unspecified part of limb: Secondary | ICD-10-CM | POA: Diagnosis not present

## 2021-07-23 DIAGNOSIS — L281 Prurigo nodularis: Secondary | ICD-10-CM | POA: Diagnosis not present

## 2021-07-23 DIAGNOSIS — L0100 Impetigo, unspecified: Secondary | ICD-10-CM | POA: Diagnosis not present

## 2021-08-06 DIAGNOSIS — L281 Prurigo nodularis: Secondary | ICD-10-CM | POA: Diagnosis not present

## 2021-08-27 DIAGNOSIS — I10 Essential (primary) hypertension: Secondary | ICD-10-CM | POA: Diagnosis not present

## 2021-08-27 DIAGNOSIS — E1169 Type 2 diabetes mellitus with other specified complication: Secondary | ICD-10-CM | POA: Diagnosis not present

## 2021-08-27 DIAGNOSIS — F322 Major depressive disorder, single episode, severe without psychotic features: Secondary | ICD-10-CM | POA: Diagnosis not present

## 2021-08-27 DIAGNOSIS — E7800 Pure hypercholesterolemia, unspecified: Secondary | ICD-10-CM | POA: Diagnosis not present

## 2021-09-04 DIAGNOSIS — H40033 Anatomical narrow angle, bilateral: Secondary | ICD-10-CM | POA: Diagnosis not present

## 2021-09-04 DIAGNOSIS — H524 Presbyopia: Secondary | ICD-10-CM | POA: Diagnosis not present

## 2021-09-04 DIAGNOSIS — H04123 Dry eye syndrome of bilateral lacrimal glands: Secondary | ICD-10-CM | POA: Diagnosis not present

## 2021-10-09 ENCOUNTER — Emergency Department (HOSPITAL_COMMUNITY)
Admission: EM | Admit: 2021-10-09 | Discharge: 2021-10-09 | Disposition: A | Discharge status: Home / Self Care | Payer: Medicare Other | Attending: Emergency Medicine | Admitting: Emergency Medicine

## 2021-10-09 ENCOUNTER — Other Ambulatory Visit: Payer: Self-pay

## 2021-10-09 ENCOUNTER — Emergency Department (HOSPITAL_COMMUNITY): Payer: Medicare Other

## 2021-10-09 ENCOUNTER — Encounter (HOSPITAL_COMMUNITY): Payer: Self-pay

## 2021-10-09 DIAGNOSIS — E119 Type 2 diabetes mellitus without complications: Secondary | ICD-10-CM | POA: Diagnosis not present

## 2021-10-09 DIAGNOSIS — Z7982 Long term (current) use of aspirin: Secondary | ICD-10-CM | POA: Insufficient documentation

## 2021-10-09 DIAGNOSIS — Z79899 Other long term (current) drug therapy: Secondary | ICD-10-CM | POA: Diagnosis not present

## 2021-10-09 DIAGNOSIS — R0789 Other chest pain: Secondary | ICD-10-CM | POA: Diagnosis not present

## 2021-10-09 DIAGNOSIS — Z7984 Long term (current) use of oral hypoglycemic drugs: Secondary | ICD-10-CM | POA: Insufficient documentation

## 2021-10-09 DIAGNOSIS — R079 Chest pain, unspecified: Secondary | ICD-10-CM | POA: Diagnosis not present

## 2021-10-09 LAB — CBC WITH DIFFERENTIAL/PLATELET
Abs Immature Granulocytes: 0.02 10*3/uL (ref 0.00–0.07)
Basophils Absolute: 0 10*3/uL (ref 0.0–0.1)
Basophils Relative: 0 %
Eosinophils Absolute: 0.1 10*3/uL (ref 0.0–0.5)
Eosinophils Relative: 1 %
HCT: 43.2 % (ref 36.0–46.0)
Hemoglobin: 14.4 g/dL (ref 12.0–15.0)
Immature Granulocytes: 0 %
Lymphocytes Relative: 17 %
Lymphs Abs: 1.6 10*3/uL (ref 0.7–4.0)
MCH: 31.4 pg (ref 26.0–34.0)
MCHC: 33.3 g/dL (ref 30.0–36.0)
MCV: 94.1 fL (ref 80.0–100.0)
Monocytes Absolute: 0.7 10*3/uL (ref 0.1–1.0)
Monocytes Relative: 7 %
Neutro Abs: 7 10*3/uL (ref 1.7–7.7)
Neutrophils Relative %: 75 %
Platelets: 298 10*3/uL (ref 150–400)
RBC: 4.59 MIL/uL (ref 3.87–5.11)
RDW: 13 % (ref 11.5–15.5)
WBC: 9.4 10*3/uL (ref 4.0–10.5)
nRBC: 0 % (ref 0.0–0.2)

## 2021-10-09 LAB — COMPREHENSIVE METABOLIC PANEL
ALT: 18 U/L (ref 0–44)
AST: 26 U/L (ref 15–41)
Albumin: 4.2 g/dL (ref 3.5–5.0)
Alkaline Phosphatase: 83 U/L (ref 38–126)
Anion gap: 8 (ref 5–15)
BUN: 11 mg/dL (ref 8–23)
CO2: 25 mmol/L (ref 22–32)
Calcium: 9.4 mg/dL (ref 8.9–10.3)
Chloride: 97 mmol/L — ABNORMAL LOW (ref 98–111)
Creatinine, Ser: 0.63 mg/dL (ref 0.44–1.00)
GFR, Estimated: >60 mL/min (ref >60)
Glucose, Bld: 121 mg/dL — ABNORMAL HIGH (ref 70–99)
Potassium: 4.9 mmol/L (ref 3.5–5.1)
Sodium: 130 mmol/L — ABNORMAL LOW (ref 135–145)
Total Bilirubin: 0.8 mg/dL (ref 0.3–1.2)
Total Protein: 7.6 g/dL (ref 6.5–8.1)

## 2021-10-09 LAB — TROPONIN I (HIGH SENSITIVITY)
Troponin I (High Sensitivity): 2 ng/L (ref <18)
Troponin I (High Sensitivity): <2 ng/L (ref <18)

## 2021-10-09 MED ORDER — HYDROCODONE-ACETAMINOPHEN 5-325 MG PO TABS: 1.0000 | ORAL_TABLET | Freq: Once | ORAL | Status: DC

## 2021-10-09 MED ORDER — ACETAMINOPHEN 325 MG PO TABS
650.0000 mg | ORAL_TABLET | Freq: Once | ORAL | Status: AC
  Administered 2021-10-09: 650 mg via ORAL
  Filled 2021-10-09: qty 2

## 2021-10-09 NOTE — ED Triage Notes (Signed)
Patient reports chest pain, "fluttering" in chest that started this am. States that she had similar episode yesterday am that resolved. Family reports anxiety and states that she took Xanax this am with little to no relief. Reports that she "doesn't feel right."

## 2021-10-09 NOTE — ED Provider Notes (Signed)
[facility] EMERGENCY DEPARTMENT Provider Note   CSN: [id number] Arrival date & time: 10/09/21  1010     History  Chief Complaint  Patient presents with   Chest Pain    [name] [name] is a 88 y.o. female.  Patient has a history of anxiety, dm and atypical chest discomfort.  She comes in again today for chest pain  The history is provided by the patient and medical records. No language interpreter was used.  Chest Pain Pain location:  L chest Pain quality: aching   Pain radiates to:  Does not radiate Pain severity:  Mild Onset quality:  Sudden Timing:  Constant Progression:  Waxing and waning Chronicity:  New Context: not breathing   Relieved by:  Nothing Worsened by:  Nothing Associated symptoms: no abdominal pain, no back pain, no cough, no fatigue and no headache        Home Medications Prior to Admission medications   Medication Sig Start Date End Date Taking? Authorizing Provider  albuterol (PROVENTIL) (2.5 MG/3ML) 0.083% nebulizer solution NEBULIZE 1 VIAL EVERY 6 HOURS AS NEEDED FOR SHORTNESS OF BREATH OR WHEEZING Patient taking differently: Take 2.5 mg by nebulization every 6 (six) hours as needed for wheezing or shortness of breath.  09/19/18   Hassell Done, Mary-Margaret, FNP  ALPRAZolam Duanne Moron) 0.25 MG tablet Take 1 tablet (0.25 mg total) by mouth 2 (two) times daily as needed for anxiety. 04/28/19   Milton Ferguson, MD  amLODipine (NORVASC) 2.5 MG tablet Take 1 tablet (2.5 mg total) by mouth daily. 03/20/19   Dettinger, Fransisca Kaufmann, MD  aspirin 81 MG tablet Take 81 mg by mouth every other day.    [provider]  blood glucose meter kit and supplies KIT Check BS once daily 12/12/15   Chipper Herb, MD  Blood Glucose Monitoring Suppl ([place]) w/Device KIT CHECK GLUCOSE TWICE DAILY AS NEEDED Dx E11.9 03/13/19   Baruch Gouty, FNP  budesonide-formoterol (SYMBICORT) 160-4.5 MCG/ACT inhaler Inhale 2 puffs into the lungs 2 (two) times daily.  10/05/17   Dettinger, Fransisca Kaufmann, MD  cholecalciferol (VITAMIN D) 1000 UNITS tablet Take 2,000 Units by mouth daily.    [provider]  cloNIDine (CATAPRES) 0.1 MG tablet Take 1 tablet (0.1 mg total) by mouth at bedtime as needed. 05/19/19   Baruch Gouty, FNP  escitalopram (LEXAPRO) 20 MG tablet Take 1 tablet (20 mg total) by mouth daily. as directed 03/20/19   Dettinger, Fransisca Kaufmann, MD  fluticasone Asencion Islam) 50 MCG/ACT nasal spray USE ONE SPRAY in each nostril ONCE daily 05/19/19   Rakes, Connye Burkitt, FNP  gabapentin (NEURONTIN) 100 MG capsule Take 1 capsule (100 mg total) by mouth at bedtime. 02/27/20   Persons, Bevely Palmer, PA  glucose blood (ONETOUCH VERIO) test strip CHECK GLUCOSE TWICE DAILY AS NEEDED Dx E11.9 03/13/19   Baruch Gouty, FNP  ketoconazole (NIZORAL) 2 % shampoo Apply 1 application topically 2 (two) times a week. 05/26/18   Chevis Pretty, FNP  Lancet Devices (ONE TOUCH DELICA LANCING DEV) MISC Use to check BG up to twice a day. DX: E11.29 - type 2 DM 09/02/16   Chipper Herb, MD  Lancets [facility] ULTRASOFT) lancets CHECK GLUCOSE TWICE DAILY AS NEEDED Dx E11.9 03/13/19   Baruch Gouty, FNP  lovastatin (MEVACOR) 40 MG tablet Take 1 tablet (40 mg total) by mouth daily. 03/20/19   Dettinger, Fransisca Kaufmann, MD  Melatonin 5 MG CAPS Take 1 capsule (5 mg total) by  mouth at bedtime. 03/20/19   Dettinger, Fransisca Kaufmann, MD  metFORMIN (GLUCOPHAGE-XR) 500 MG 24 hr tablet 1 tablet once daily 03/20/19   Dettinger, Fransisca Kaufmann, MD  Omega-3 Fatty Acids (FISH OIL) 1000 MG CAPS Take by mouth.    [provider]  predniSONE (DELTASONE) 10 MG tablet Take 1 tablet (10 mg total) by mouth daily with breakfast. 02/27/20   Persons, Bevely Palmer, PA  triamcinolone cream (KENALOG) 0.1 % Apply 1 application topically 2 (two) times daily. 02/20/19   Baruch Gouty, FNP      Allergies    Lisinopril, Chlordiazepoxide-clidinium, Clarithromycin, Penicillins, Betadine [povidone iodine], Erythromycin, Evista  [raloxifene hydrochloride], Hydrogen peroxide, Sertraline hcl, and Sulfa antibiotics    Review of Systems   Review of Systems  Constitutional:  Negative for appetite change and fatigue.  HENT:  Negative for congestion, ear discharge and sinus pressure.   Eyes:  Negative for discharge.  Respiratory:  Negative for cough.   Cardiovascular:  Positive for chest pain.  Gastrointestinal:  Negative for abdominal pain and diarrhea.  Genitourinary:  Negative for frequency and hematuria.  Musculoskeletal:  Negative for back pain.  Skin:  Negative for rash.  Neurological:  Negative for seizures and headaches.  Psychiatric/Behavioral:  Negative for hallucinations.     Physical Exam Updated Vital Signs BP (!) 169/78   Pulse 96   Temp 97.8 F (36.6 C)   Resp 20   Ht 5' 2"  (1.575 m)   Wt 68.9 kg   SpO2 96%   BMI 27.80 kg/m  Physical Exam Vitals and nursing note reviewed.  Constitutional:      Appearance: She is well-developed.  HENT:     Head: Normocephalic.     Nose: Nose normal.  Eyes:     General: No scleral icterus.    Conjunctiva/sclera: Conjunctivae normal.  Neck:     Thyroid: No thyromegaly.  Cardiovascular:     Rate and Rhythm: Normal rate and regular rhythm.     Heart sounds: No murmur heard.    No friction rub. No gallop.  Pulmonary:     Breath sounds: No stridor. No wheezing or rales.  Chest:     Chest wall: No tenderness.  Abdominal:     General: There is no distension.     Tenderness: There is no abdominal tenderness. There is no rebound.  Musculoskeletal:        General: Normal range of motion.     Cervical back: Neck supple.  Lymphadenopathy:     Cervical: No cervical adenopathy.  Skin:    Findings: No erythema or rash.  Neurological:     Mental Status: She is alert and oriented to person, place, and time.     Motor: No abnormal muscle tone.     Coordination: Coordination normal.  Psychiatric:        Behavior: Behavior normal.     ED Results /  Procedures / Treatments   Labs (all labs ordered are listed, but only abnormal results are displayed) Labs Reviewed  COMPREHENSIVE METABOLIC PANEL - Abnormal; Notable for the following components:      Result Value   Sodium 130 (*)    Chloride 97 (*)    Glucose, Bld 121 (*)    All other components within normal limits  CBC WITH DIFFERENTIAL/PLATELET  TROPONIN I (HIGH SENSITIVITY)  TROPONIN I (HIGH SENSITIVITY)    EKG None  Radiology DG Chest Port 1 View  Result Date: 10/09/2021 CLINICAL DATA:  Chest pain EXAM: PORTABLE  CHEST 1 VIEW COMPARISON:  04/28/2019 FINDINGS: Heart size appears within normal limits. Chronic interstitial coarsening is identified bilaterally. No pleural effusion or edema. Mild patchy densities within the lung bases may represent atelectasis or early infiltrates. Visualized osseous structures are unremarkable. IMPRESSION: Mild patchy densities within the lung bases may represent atelectasis or early infiltrates. Electronically Signed   By: Kerby Moors M.D.   On: 10/09/2021 11:15    Procedures Procedures    Medications Ordered in ED Medications  acetaminophen (TYLENOL) tablet 650 mg (650 mg Oral Given 10/09/21 1245)    ED Course/ Medical Decision Making/ A&P                           Medical Decision Making Amount and/or Complexity of Data Reviewed Labs: ordered. Radiology: ordered. ECG/medicine tests: ordered.  Risk OTC drugs.  This patient presents to the ED for concern of chest pain, this involves an extensive number of treatment options, and is a complaint that carries with it a high risk of complications and morbidity.  The differential diagnosis includes PE, MI   Co morbidities that complicate the patient evaluation  Diabetes anxiety   Additional history obtained:  Additional history obtained from patient External records from outside source obtained and reviewed including hospital record   Lab Tests:  I Ordered, and personally  interpreted labs.  The pertinent results include: Troponin x2 negative CBC normal   Imaging Studies ordered:  I ordered imaging studies including chest x-ray I independently visualized and interpreted imaging which showed atelectasis I agree with the radiologist interpretation   Cardiac Monitoring: / EKG:  The patient was maintained on a cardiac monitor.  I personally viewed and interpreted the cardiac monitored which showed an underlying rhythm of: Normal sinus rhythm   Consultations Obtained: No consult Problem List / ED Course / Critical interventions / Medication management  Chest pain I ordered medication including Tylenol Reevaluation of the patient after these medicines showed that the patient improved I have reviewed the patients home medicines and have made adjustments as needed   Social Determinants of Health:  None   Test / Admission - Considered:  CT angio considered but not needed   Patient with atypical chest pain and normal troponins.  She will follow-up with cardiology        Final Clinical Impression(s) / ED Diagnoses Final diagnoses:  Atypical chest pain    Rx / DC Orders ED Discharge Orders     None         Milton Ferguson, MD 10/10/21 1733

## 2021-10-09 NOTE — Discharge Instructions (Signed)
Follow-up with your family doctor in the next couple weeks for your leg.  Follow-up with the cardiology Dr. Harl Bowie or one of his associates in the next couple weeks for your chest discomfort.  Take Tylenol for the pain

## 2021-10-16 DIAGNOSIS — I10 Essential (primary) hypertension: Secondary | ICD-10-CM | POA: Diagnosis not present

## 2021-10-16 DIAGNOSIS — F419 Anxiety disorder, unspecified: Secondary | ICD-10-CM | POA: Diagnosis not present

## 2021-11-17 DIAGNOSIS — I10 Essential (primary) hypertension: Secondary | ICD-10-CM | POA: Diagnosis not present

## 2021-11-17 DIAGNOSIS — F419 Anxiety disorder, unspecified: Secondary | ICD-10-CM | POA: Diagnosis not present

## 2021-11-17 DIAGNOSIS — E7800 Pure hypercholesterolemia, unspecified: Secondary | ICD-10-CM | POA: Diagnosis not present

## 2021-11-17 DIAGNOSIS — R3989 Other symptoms and signs involving the genitourinary system: Secondary | ICD-10-CM | POA: Diagnosis not present

## 2021-11-17 DIAGNOSIS — Z7984 Long term (current) use of oral hypoglycemic drugs: Secondary | ICD-10-CM | POA: Diagnosis not present

## 2021-11-17 DIAGNOSIS — E1169 Type 2 diabetes mellitus with other specified complication: Secondary | ICD-10-CM | POA: Diagnosis not present

## 2021-12-08 DIAGNOSIS — I83009 Varicose veins of unspecified lower extremity with ulcer of unspecified site: Secondary | ICD-10-CM | POA: Diagnosis not present

## 2021-12-13 DIAGNOSIS — E11622 Type 2 diabetes mellitus with other skin ulcer: Secondary | ICD-10-CM | POA: Diagnosis not present

## 2021-12-13 DIAGNOSIS — L97919 Non-pressure chronic ulcer of unspecified part of right lower leg with unspecified severity: Secondary | ICD-10-CM | POA: Diagnosis not present

## 2021-12-16 ENCOUNTER — Emergency Department (HOSPITAL_COMMUNITY)
Admission: EM | Admit: 2021-12-16 | Discharge: 2021-12-16 | Disposition: A | Discharge status: Home / Self Care | Payer: Medicare Other | Attending: Emergency Medicine | Admitting: Emergency Medicine

## 2021-12-16 ENCOUNTER — Other Ambulatory Visit: Payer: Self-pay

## 2021-12-16 ENCOUNTER — Encounter (HOSPITAL_COMMUNITY): Payer: Self-pay

## 2021-12-16 ENCOUNTER — Emergency Department (HOSPITAL_COMMUNITY): Payer: Medicare Other

## 2021-12-16 DIAGNOSIS — Z4801 Encounter for change or removal of surgical wound dressing: Secondary | ICD-10-CM | POA: Diagnosis not present

## 2021-12-16 DIAGNOSIS — Z4800 Encounter for change or removal of nonsurgical wound dressing: Secondary | ICD-10-CM | POA: Insufficient documentation

## 2021-12-16 DIAGNOSIS — I10 Essential (primary) hypertension: Secondary | ICD-10-CM | POA: Diagnosis not present

## 2021-12-16 DIAGNOSIS — Z5189 Encounter for other specified aftercare: Secondary | ICD-10-CM

## 2021-12-16 DIAGNOSIS — Z7984 Long term (current) use of oral hypoglycemic drugs: Secondary | ICD-10-CM | POA: Insufficient documentation

## 2021-12-16 DIAGNOSIS — J449 Chronic obstructive pulmonary disease, unspecified: Secondary | ICD-10-CM | POA: Diagnosis not present

## 2021-12-16 DIAGNOSIS — Z79899 Other long term (current) drug therapy: Secondary | ICD-10-CM | POA: Diagnosis not present

## 2021-12-16 DIAGNOSIS — Z7982 Long term (current) use of aspirin: Secondary | ICD-10-CM | POA: Insufficient documentation

## 2021-12-16 DIAGNOSIS — E119 Type 2 diabetes mellitus without complications: Secondary | ICD-10-CM | POA: Diagnosis not present

## 2021-12-16 DIAGNOSIS — Z7951 Long term (current) use of inhaled steroids: Secondary | ICD-10-CM | POA: Insufficient documentation

## 2021-12-16 DIAGNOSIS — E871 Hypo-osmolality and hyponatremia: Secondary | ICD-10-CM | POA: Diagnosis not present

## 2021-12-16 DIAGNOSIS — S81801A Unspecified open wound, right lower leg, initial encounter: Secondary | ICD-10-CM | POA: Diagnosis not present

## 2021-12-16 LAB — COMPREHENSIVE METABOLIC PANEL
ALT: 20 U/L (ref 0–44)
AST: 23 U/L (ref 15–41)
Albumin: 4.5 g/dL (ref 3.5–5.0)
Alkaline Phosphatase: 84 U/L (ref 38–126)
Anion gap: 9 (ref 5–15)
BUN: 8 mg/dL (ref 8–23)
CO2: 25 mmol/L (ref 22–32)
Calcium: 9.4 mg/dL (ref 8.9–10.3)
Chloride: 97 mmol/L — ABNORMAL LOW (ref 98–111)
Creatinine, Ser: 0.53 mg/dL (ref 0.44–1.00)
GFR, Estimated: >60 mL/min (ref >60)
Glucose, Bld: 134 mg/dL — ABNORMAL HIGH (ref 70–99)
Potassium: 4.4 mmol/L (ref 3.5–5.1)
Sodium: 131 mmol/L — ABNORMAL LOW (ref 135–145)
Total Bilirubin: 0.8 mg/dL (ref 0.3–1.2)
Total Protein: 7.8 g/dL (ref 6.5–8.1)

## 2021-12-16 LAB — CBC WITH DIFFERENTIAL/PLATELET
Abs Immature Granulocytes: 0.02 10*3/uL (ref 0.00–0.07)
Basophils Absolute: 0 10*3/uL (ref 0.0–0.1)
Basophils Relative: 1 %
Eosinophils Absolute: 0 10*3/uL (ref 0.0–0.5)
Eosinophils Relative: 1 %
HCT: 45.1 % (ref 36.0–46.0)
Hemoglobin: 14.7 g/dL (ref 12.0–15.0)
Immature Granulocytes: 0 %
Lymphocytes Relative: 25 %
Lymphs Abs: 2.2 10*3/uL (ref 0.7–4.0)
MCH: 30.8 pg (ref 26.0–34.0)
MCHC: 32.6 g/dL (ref 30.0–36.0)
MCV: 94.5 fL (ref 80.0–100.0)
Monocytes Absolute: 0.5 10*3/uL (ref 0.1–1.0)
Monocytes Relative: 5 %
Neutro Abs: 6.1 10*3/uL (ref 1.7–7.7)
Neutrophils Relative %: 68 %
RBC: 4.77 MIL/uL (ref 3.87–5.11)
RDW: 13 % (ref 11.5–15.5)
Smear Review: ADEQUATE
WBC: 8.8 10*3/uL (ref 4.0–10.5)
nRBC: 0 % (ref 0.0–0.2)

## 2021-12-16 MED ORDER — HYDROCODONE-ACETAMINOPHEN 5-325 MG PO TABS
1.0000 | ORAL_TABLET | Freq: Once | ORAL | Status: AC
  Administered 2021-12-16: 1 via ORAL
  Filled 2021-12-16: qty 1

## 2021-12-16 MED ORDER — CEPHALEXIN 500 MG PO CAPS: 500.0000 mg | ORAL_CAPSULE | Freq: Four times a day (QID) | ORAL | 0 refills | Status: AC

## 2021-12-16 MED ORDER — DOXYCYCLINE HYCLATE 100 MG PO CAPS: 100.0000 mg | ORAL_CAPSULE | Freq: Two times a day (BID) | ORAL | 0 refills | Status: DC

## 2021-12-16 MED ORDER — BACITRACIN ZINC 500 UNIT/GM EX OINT: 1.0000 | TOPICAL_OINTMENT | Freq: Two times a day (BID) | CUTANEOUS | 0 refills | Status: DC

## 2021-12-16 NOTE — Discharge Instructions (Addendum)
Note your visit today was overall reassuring.  As we discussed, I will prescribe a second antibiotic to Keflex in addition to the doxycycline you are taking.  I given you 4 extra days of doxycycline to take once your initial prescription runs out.  As we discussed, wound care is necessary to make sure your wounds are healing well.  I also recommend getting back in with your dermatologist to get biopsies of affected wounds that look concerning for potential cancer.  I will also prescribe antibiotic ointment to put over affected ulcer twice a day.  Please do not hesitate to return to the emergency department if the worrisome signs symptoms we discussed become apparent.
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
## 2021-12-16 NOTE — ED Provider Notes (Signed)
[facility] EMERGENCY DEPARTMENT Provider Note   CSN: [id number] Arrival date & time: 12/16/21  1023     History  Chief Complaint  Patient presents with   Wound Check    [name] [name] is a 88 y.o. female.   Wound Check   88 year old female presents emergency department with complaints of ulcer on right leg.  Patient states that moods have been present for approximately past 2 to 3 months.  She notes history last week prior dermatologist who prescribed a benzyl peroxide solution to place on the wound.  She states that she had "allergic reaction" to the benzyl peroxide that has redness began to extend from the wound.  She has referral to wound care but states she has not been able to get in until 3 weeks from now.  She was seen by her primary care provider 3 days ago who placed her on doxycycline which she has been taking as prescribed.  She presents emergency department for fever that she may lose her leg.  Denies fever, chills, night sweats, weakness/sensory deficits in leg.  She states the wounds are nonpainful.  She states she has chronic right hip pain secondary to osteoarthritis that is causing her the most pain.  Past medical history significant for diabetes mellitus, COPD, hypertension, hyperlipidemia,   Home Medications Prior to Admission medications   Medication Sig Start Date End Date Taking? Authorizing Provider  bacitracin ointment Apply 1 Application topically 2 (two) times daily. 12/16/21  Yes Dion Saucier A, PA  cephALEXin (KEFLEX) 500 MG capsule Take 1 capsule (500 mg total) by mouth 4 (four) times daily for 10 days. 12/16/21 12/26/21 Yes Dion Saucier A, PA  doxycycline (VIBRAMYCIN) 100 MG capsule Take 1 capsule (100 mg total) by mouth 2 (two) times daily. 12/16/21  Yes Dion Saucier A, PA  albuterol (PROVENTIL) (2.5 MG/3ML) 0.083% nebulizer solution NEBULIZE 1 VIAL EVERY 6 HOURS AS NEEDED FOR SHORTNESS OF BREATH OR WHEEZING Patient taking differently: Take 2.5 mg by  nebulization every 6 (six) hours as needed for wheezing or shortness of breath.  09/19/18   Hassell Done, Mary-Margaret, FNP  ALPRAZolam Duanne Moron) 0.25 MG tablet Take 1 tablet (0.25 mg total) by mouth 2 (two) times daily as needed for anxiety. 04/28/19   Milton Ferguson, MD  amLODipine (NORVASC) 2.5 MG tablet Take 1 tablet (2.5 mg total) by mouth daily. 03/20/19   Dettinger, Fransisca Kaufmann, MD  aspirin 81 MG tablet Take 81 mg by mouth every other day.    [provider]  blood glucose meter kit and supplies KIT Check BS once daily 12/12/15   Chipper Herb, MD  Blood Glucose Monitoring Suppl ([place]) w/Device KIT CHECK GLUCOSE TWICE DAILY AS NEEDED Dx E11.9 03/13/19   Baruch Gouty, FNP  budesonide-formoterol (SYMBICORT) 160-4.5 MCG/ACT inhaler Inhale 2 puffs into the lungs 2 (two) times daily. 10/05/17   Dettinger, Fransisca Kaufmann, MD  cholecalciferol (VITAMIN D) 1000 UNITS tablet Take 2,000 Units by mouth daily.    [provider]  cloNIDine (CATAPRES) 0.1 MG tablet Take 1 tablet (0.1 mg total) by mouth at bedtime as needed. 05/19/19   Baruch Gouty, FNP  escitalopram (LEXAPRO) 20 MG tablet Take 1 tablet (20 mg total) by mouth daily. as directed 03/20/19   Dettinger, Fransisca Kaufmann, MD  fluticasone Asencion Islam) 50 MCG/ACT nasal spray USE ONE SPRAY in each nostril ONCE daily 05/19/19   Rakes, Connye Burkitt, FNP  gabapentin (NEURONTIN) 100 MG capsule Take 1 capsule (100  mg total) by mouth at bedtime. 02/27/20   Persons, Bevely Palmer, PA  glucose blood (ONETOUCH VERIO) test strip CHECK GLUCOSE TWICE DAILY AS NEEDED Dx E11.9 03/13/19   Baruch Gouty, FNP  ketoconazole (NIZORAL) 2 % shampoo Apply 1 application topically 2 (two) times a week. 05/26/18   Chevis Pretty, FNP  Lancet Devices (ONE TOUCH DELICA LANCING DEV) MISC Use to check BG up to twice a day. DX: E11.29 - type 2 DM 09/02/16   Chipper Herb, MD  Lancets [facility] ULTRASOFT) lancets CHECK GLUCOSE TWICE DAILY AS NEEDED Dx E11.9 03/13/19    Baruch Gouty, FNP  lovastatin (MEVACOR) 40 MG tablet Take 1 tablet (40 mg total) by mouth daily. 03/20/19   Dettinger, Fransisca Kaufmann, MD  Melatonin 5 MG CAPS Take 1 capsule (5 mg total) by mouth at bedtime. 03/20/19   Dettinger, Fransisca Kaufmann, MD  metFORMIN (GLUCOPHAGE-XR) 500 MG 24 hr tablet 1 tablet once daily 03/20/19   Dettinger, Fransisca Kaufmann, MD  Omega-3 Fatty Acids (FISH OIL) 1000 MG CAPS Take by mouth.    [provider]  predniSONE (DELTASONE) 10 MG tablet Take 1 tablet (10 mg total) by mouth daily with breakfast. 02/27/20   Persons, Bevely Palmer, PA  triamcinolone cream (KENALOG) 0.1 % Apply 1 application topically 2 (two) times daily. 02/20/19   Baruch Gouty, FNP      Allergies    Lisinopril, Chlordiazepoxide-clidinium, Clarithromycin, Penicillins, Betadine [povidone iodine], Erythromycin, Evista [raloxifene hydrochloride], Hydrogen peroxide, Sertraline hcl, and Sulfa antibiotics    Review of Systems   Review of Systems  All other systems reviewed and are negative.   Physical Exam Updated Vital Signs BP (!) 149/73   Pulse 68   Temp 98.3 F (36.8 C) (Oral)   Resp 17   Ht 5' 2"  (1.575 m)   Wt 68.9 kg   SpO2 96%   BMI 27.80 kg/m  Physical Exam Vitals and nursing note reviewed.  Constitutional:      General: She is not in acute distress.    Appearance: She is well-developed.  HENT:     Head: Normocephalic and atraumatic.  Eyes:     Conjunctiva/sclera: Conjunctivae normal.  Cardiovascular:     Rate and Rhythm: Normal rate and regular rhythm.     Heart sounds: No murmur heard. Pulmonary:     Effort: Pulmonary effort is normal. No respiratory distress.     Breath sounds: Normal breath sounds.  Abdominal:     Palpations: Abdomen is soft.     Tenderness: There is no abdominal tenderness.  Musculoskeletal:        General: No swelling.     Cervical back: Neck supple.  Skin:    General: Skin is warm and dry.     Capillary Refill: Capillary refill takes less than 2 seconds.      Comments: 3 ulcerative lesions noted on patient's lower extremities.  2 on the right lower extremity as seen in picture below.  1 on left lower extremity that is 1 cm in diameter and has a scab overlying with no surrounding erythema.  Neurological:     Mental Status: She is alert.  Psychiatric:        Mood and Affect: Mood normal.        ED Results / Procedures / Treatments   Labs (all labs ordered are listed, but only abnormal results are displayed) Labs Reviewed  COMPREHENSIVE METABOLIC PANEL - Abnormal; Notable for the following components:  Result Value   Sodium 131 (*)    Chloride 97 (*)    Glucose, Bld 134 (*)    All other components within normal limits  CBC WITH DIFFERENTIAL/PLATELET    EKG None  Radiology DG Tibia/Fibula Right  Result Date: 12/16/2021 CLINICAL DATA:  Pain, right leg wound EXAM: RIGHT TIBIA AND FIBULA - 2 VIEW COMPARISON:  None Available. FINDINGS: There is no evidence of fracture or other focal bone lesions. Small calcaneal spurs. Soft tissue defect at the lateral calf. No subcutaneous gas or radiodense foreign body. IMPRESSION: Lateral calf soft tissue defect. Calcaneal spurs. Electronically Signed   By: Lucrezia Europe M.D.   On: 12/16/2021 14:43    Procedures Procedures    Medications Ordered in ED Medications  HYDROcodone-acetaminophen (NORCO/VICODIN) 5-325 MG per tablet 1 tablet (1 tablet Oral Given 12/16/21 1432)    ED Course/ Medical Decision Making/ A&P                           Medical Decision Making Amount and/or Complexity of Data Reviewed Labs: ordered. Radiology: ordered.  Risk OTC drugs. Prescription drug management.   This patient presents to the ED for concern of wound, this involves an extensive number of treatment options, and is a complaint that carries with it a high risk of complications and morbidity.  The differential diagnosis includes ulcer, cellulitis, erysipelas, malignancy, abscess, necrotizing  fasciitis   Co morbidities that complicate the patient evaluation  See HPI   Additional history obtained:  Additional history obtained from EMR External records from outside source obtained and reviewed including urgent care visit from 12/13/2021 indicating patient antibiotic placement on doxycycline.   Lab Tests:  I Ordered, and personally interpreted labs.  The pertinent results include: Hyponatremia with a sodium 131.  This seems near patient's baseline from prior laboratory studies ordered.  Renal function within normal limits.  No transaminitis noted.  No leukocytosis noted.  No evidence of anemia or platelet dysfunction.  Imaging Studies ordered:  I ordered imaging studies including right tib-fib x-ray I independently visualized and interpreted imaging which showed Soft tissue defect on the lateral right calf with no obvious subcutaneous gas or foreign body noted.  Calcaneal spurs. I agree with the radiologist interpretation   Cardiac Monitoring: / EKG:  The patient was maintained on a cardiac monitor.  I personally viewed and interpreted the cardiac monitored which showed an underlying rhythm of: Sinus rhythm   Consultations Obtained:  N/a   Problem List / ED Course / Critical interventions / Medication management  Wound I ordered medication including Norco for pain   Reevaluation of the patient after these medicines showed that the patient improved I have reviewed the patients home medicines and have made adjustments as needed   Social Determinants of Health:  Denies tobacco, illicit drug use   Test / Admission - Considered:  Wound Vitals signs significant for hypertension with blood pressure 163/89.  Recommend close follow-up with PCP regarding elevation of blood pressure.. Otherwise within normal range and stable throughout visit. Laboratory/imaging studies significant for: See above Patient symptoms most likely secondary to chronic diabetic ulcer on right  lateral leg as well as concerning skin changes for malignancy as depicted in picture above.  No biopsy has been performed at this time.  No obvious evidence of subcutaneous gas.  Labs within normal range patient has had normal vital signs so doubt sepsis.  Doubt osteomyelitis so further imaging of MRI was  not obtained at this time.  Recommend close follow-up with wound care for continued care of affected wound as well as dermatology for biopsy of area of concern as depicted above.  Patient replaced on Keflex in addition to her doxycycline outpatient for 2 weeks as well as topical bacitracin ointment for affected wound with prompt wound care follow-up thereafter.  Treatment plan was discussed at length with patient and she knowledge understanding was agreeable to said plan. Worrisome signs and symptoms were discussed with the patient, and the patient acknowledged understanding to return to the ED if noticed. Patient was stable upon discharge.         Final Clinical Impression(s) / ED Diagnoses Final diagnoses:  Visit for wound check    Rx / DC Orders ED Discharge Orders          Ordered    doxycycline (VIBRAMYCIN) 100 MG capsule  2 times daily        12/16/21 1527    cephALEXin (KEFLEX) 500 MG capsule  4 times daily        12/16/21 1527    bacitracin ointment  2 times daily        12/16/21 1527              Wilnette Kales, [place] 12/16/21 1748    Isla Pence, MD 12/17/21 [phone]

## 2021-12-23 DIAGNOSIS — I10 Essential (primary) hypertension: Secondary | ICD-10-CM | POA: Diagnosis not present

## 2021-12-23 DIAGNOSIS — J449 Chronic obstructive pulmonary disease, unspecified: Secondary | ICD-10-CM | POA: Diagnosis not present

## 2021-12-23 DIAGNOSIS — I890 Lymphedema, not elsewhere classified: Secondary | ICD-10-CM | POA: Insufficient documentation

## 2021-12-23 DIAGNOSIS — Z881 Allergy status to other antibiotic agents status: Secondary | ICD-10-CM | POA: Diagnosis not present

## 2021-12-23 DIAGNOSIS — L97919 Non-pressure chronic ulcer of unspecified part of right lower leg with unspecified severity: Secondary | ICD-10-CM | POA: Diagnosis not present

## 2021-12-23 DIAGNOSIS — E11621 Type 2 diabetes mellitus with foot ulcer: Secondary | ICD-10-CM | POA: Diagnosis not present

## 2021-12-23 DIAGNOSIS — L97519 Non-pressure chronic ulcer of other part of right foot with unspecified severity: Secondary | ICD-10-CM | POA: Diagnosis not present

## 2021-12-23 DIAGNOSIS — E11622 Type 2 diabetes mellitus with other skin ulcer: Secondary | ICD-10-CM | POA: Diagnosis not present

## 2021-12-23 DIAGNOSIS — Z882 Allergy status to sulfonamides status: Secondary | ICD-10-CM | POA: Diagnosis not present

## 2021-12-25 DIAGNOSIS — F322 Major depressive disorder, single episode, severe without psychotic features: Secondary | ICD-10-CM | POA: Diagnosis not present

## 2021-12-25 DIAGNOSIS — E7800 Pure hypercholesterolemia, unspecified: Secondary | ICD-10-CM | POA: Diagnosis not present

## 2021-12-25 DIAGNOSIS — E1169 Type 2 diabetes mellitus with other specified complication: Secondary | ICD-10-CM | POA: Diagnosis not present

## 2021-12-25 DIAGNOSIS — I10 Essential (primary) hypertension: Secondary | ICD-10-CM | POA: Diagnosis not present

## 2021-12-26 DIAGNOSIS — J449 Chronic obstructive pulmonary disease, unspecified: Secondary | ICD-10-CM | POA: Diagnosis not present

## 2021-12-26 DIAGNOSIS — Z881 Allergy status to other antibiotic agents status: Secondary | ICD-10-CM | POA: Diagnosis not present

## 2021-12-26 DIAGNOSIS — Z882 Allergy status to sulfonamides status: Secondary | ICD-10-CM | POA: Diagnosis not present

## 2021-12-26 DIAGNOSIS — E11621 Type 2 diabetes mellitus with foot ulcer: Secondary | ICD-10-CM | POA: Diagnosis not present

## 2021-12-26 DIAGNOSIS — L97519 Non-pressure chronic ulcer of other part of right foot with unspecified severity: Secondary | ICD-10-CM | POA: Diagnosis not present

## 2021-12-26 DIAGNOSIS — I10 Essential (primary) hypertension: Secondary | ICD-10-CM | POA: Diagnosis not present

## 2021-12-29 DIAGNOSIS — Z7984 Long term (current) use of oral hypoglycemic drugs: Secondary | ICD-10-CM | POA: Diagnosis not present

## 2021-12-29 DIAGNOSIS — Z4800 Encounter for change or removal of nonsurgical wound dressing: Secondary | ICD-10-CM | POA: Diagnosis not present

## 2021-12-29 DIAGNOSIS — J449 Chronic obstructive pulmonary disease, unspecified: Secondary | ICD-10-CM | POA: Diagnosis not present

## 2021-12-29 DIAGNOSIS — E1151 Type 2 diabetes mellitus with diabetic peripheral angiopathy without gangrene: Secondary | ICD-10-CM | POA: Diagnosis not present

## 2021-12-29 DIAGNOSIS — Z7951 Long term (current) use of inhaled steroids: Secondary | ICD-10-CM | POA: Diagnosis not present

## 2021-12-29 DIAGNOSIS — L97815 Non-pressure chronic ulcer of other part of right lower leg with muscle involvement without evidence of necrosis: Secondary | ICD-10-CM | POA: Diagnosis not present

## 2021-12-29 DIAGNOSIS — I890 Lymphedema, not elsewhere classified: Secondary | ICD-10-CM | POA: Diagnosis not present

## 2021-12-29 DIAGNOSIS — F32A Depression, unspecified: Secondary | ICD-10-CM | POA: Diagnosis not present

## 2021-12-29 DIAGNOSIS — E11622 Type 2 diabetes mellitus with other skin ulcer: Secondary | ICD-10-CM | POA: Diagnosis not present

## 2021-12-29 DIAGNOSIS — I872 Venous insufficiency (chronic) (peripheral): Secondary | ICD-10-CM | POA: Diagnosis not present

## 2021-12-29 DIAGNOSIS — M7989 Other specified soft tissue disorders: Secondary | ICD-10-CM | POA: Diagnosis not present

## 2021-12-29 DIAGNOSIS — L97919 Non-pressure chronic ulcer of unspecified part of right lower leg with unspecified severity: Secondary | ICD-10-CM | POA: Diagnosis not present

## 2021-12-29 DIAGNOSIS — L97811 Non-pressure chronic ulcer of other part of right lower leg limited to breakdown of skin: Secondary | ICD-10-CM | POA: Diagnosis not present

## 2021-12-29 DIAGNOSIS — L97821 Non-pressure chronic ulcer of other part of left lower leg limited to breakdown of skin: Secondary | ICD-10-CM | POA: Diagnosis not present

## 2021-12-29 DIAGNOSIS — Z7982 Long term (current) use of aspirin: Secondary | ICD-10-CM | POA: Diagnosis not present

## 2021-12-29 DIAGNOSIS — R600 Localized edema: Secondary | ICD-10-CM | POA: Diagnosis not present

## 2021-12-29 DIAGNOSIS — F419 Anxiety disorder, unspecified: Secondary | ICD-10-CM | POA: Diagnosis not present

## 2021-12-29 DIAGNOSIS — I10 Essential (primary) hypertension: Secondary | ICD-10-CM | POA: Diagnosis not present

## 2022-01-02 DIAGNOSIS — M7989 Other specified soft tissue disorders: Secondary | ICD-10-CM | POA: Diagnosis not present

## 2022-01-02 DIAGNOSIS — M79662 Pain in left lower leg: Secondary | ICD-10-CM | POA: Diagnosis not present

## 2022-01-02 DIAGNOSIS — R52 Pain, unspecified: Secondary | ICD-10-CM | POA: Diagnosis not present

## 2022-01-02 DIAGNOSIS — R609 Edema, unspecified: Secondary | ICD-10-CM | POA: Diagnosis not present

## 2022-01-02 DIAGNOSIS — M79661 Pain in right lower leg: Secondary | ICD-10-CM | POA: Diagnosis not present

## 2022-01-02 DIAGNOSIS — L97919 Non-pressure chronic ulcer of unspecified part of right lower leg with unspecified severity: Secondary | ICD-10-CM | POA: Diagnosis not present

## 2022-01-02 DIAGNOSIS — E11622 Type 2 diabetes mellitus with other skin ulcer: Secondary | ICD-10-CM | POA: Diagnosis not present

## 2022-01-06 DIAGNOSIS — J449 Chronic obstructive pulmonary disease, unspecified: Secondary | ICD-10-CM | POA: Diagnosis not present

## 2022-01-06 DIAGNOSIS — L97919 Non-pressure chronic ulcer of unspecified part of right lower leg with unspecified severity: Secondary | ICD-10-CM | POA: Diagnosis not present

## 2022-01-06 DIAGNOSIS — Z881 Allergy status to other antibiotic agents status: Secondary | ICD-10-CM | POA: Diagnosis not present

## 2022-01-06 DIAGNOSIS — I10 Essential (primary) hypertension: Secondary | ICD-10-CM | POA: Diagnosis not present

## 2022-01-06 DIAGNOSIS — E11621 Type 2 diabetes mellitus with foot ulcer: Secondary | ICD-10-CM | POA: Diagnosis not present

## 2022-01-06 DIAGNOSIS — E11622 Type 2 diabetes mellitus with other skin ulcer: Secondary | ICD-10-CM | POA: Diagnosis not present

## 2022-01-06 DIAGNOSIS — L97519 Non-pressure chronic ulcer of other part of right foot with unspecified severity: Secondary | ICD-10-CM | POA: Diagnosis not present

## 2022-01-06 DIAGNOSIS — Z882 Allergy status to sulfonamides status: Secondary | ICD-10-CM | POA: Diagnosis not present

## 2022-01-20 DIAGNOSIS — Z882 Allergy status to sulfonamides status: Secondary | ICD-10-CM | POA: Diagnosis not present

## 2022-01-20 DIAGNOSIS — J449 Chronic obstructive pulmonary disease, unspecified: Secondary | ICD-10-CM | POA: Diagnosis not present

## 2022-01-20 DIAGNOSIS — L97919 Non-pressure chronic ulcer of unspecified part of right lower leg with unspecified severity: Secondary | ICD-10-CM | POA: Diagnosis not present

## 2022-01-20 DIAGNOSIS — I10 Essential (primary) hypertension: Secondary | ICD-10-CM | POA: Diagnosis not present

## 2022-01-20 DIAGNOSIS — L97519 Non-pressure chronic ulcer of other part of right foot with unspecified severity: Secondary | ICD-10-CM | POA: Diagnosis not present

## 2022-01-20 DIAGNOSIS — E11621 Type 2 diabetes mellitus with foot ulcer: Secondary | ICD-10-CM | POA: Diagnosis not present

## 2022-01-20 DIAGNOSIS — E11622 Type 2 diabetes mellitus with other skin ulcer: Secondary | ICD-10-CM | POA: Diagnosis not present

## 2022-01-20 DIAGNOSIS — Z881 Allergy status to other antibiotic agents status: Secondary | ICD-10-CM | POA: Diagnosis not present

## 2022-01-28 DIAGNOSIS — Z7951 Long term (current) use of inhaled steroids: Secondary | ICD-10-CM | POA: Diagnosis not present

## 2022-01-28 DIAGNOSIS — L97815 Non-pressure chronic ulcer of other part of right lower leg with muscle involvement without evidence of necrosis: Secondary | ICD-10-CM | POA: Diagnosis not present

## 2022-01-28 DIAGNOSIS — Z7982 Long term (current) use of aspirin: Secondary | ICD-10-CM | POA: Diagnosis not present

## 2022-01-28 DIAGNOSIS — L97821 Non-pressure chronic ulcer of other part of left lower leg limited to breakdown of skin: Secondary | ICD-10-CM | POA: Diagnosis not present

## 2022-01-28 DIAGNOSIS — F32A Depression, unspecified: Secondary | ICD-10-CM | POA: Diagnosis not present

## 2022-01-28 DIAGNOSIS — Z7984 Long term (current) use of oral hypoglycemic drugs: Secondary | ICD-10-CM | POA: Diagnosis not present

## 2022-01-28 DIAGNOSIS — I890 Lymphedema, not elsewhere classified: Secondary | ICD-10-CM | POA: Diagnosis not present

## 2022-01-28 DIAGNOSIS — I872 Venous insufficiency (chronic) (peripheral): Secondary | ICD-10-CM | POA: Diagnosis not present

## 2022-01-28 DIAGNOSIS — Z4800 Encounter for change or removal of nonsurgical wound dressing: Secondary | ICD-10-CM | POA: Diagnosis not present

## 2022-01-28 DIAGNOSIS — F419 Anxiety disorder, unspecified: Secondary | ICD-10-CM | POA: Diagnosis not present

## 2022-01-28 DIAGNOSIS — L97811 Non-pressure chronic ulcer of other part of right lower leg limited to breakdown of skin: Secondary | ICD-10-CM | POA: Diagnosis not present

## 2022-01-28 DIAGNOSIS — E1151 Type 2 diabetes mellitus with diabetic peripheral angiopathy without gangrene: Secondary | ICD-10-CM | POA: Diagnosis not present

## 2022-01-28 DIAGNOSIS — I10 Essential (primary) hypertension: Secondary | ICD-10-CM | POA: Diagnosis not present

## 2022-01-28 DIAGNOSIS — J449 Chronic obstructive pulmonary disease, unspecified: Secondary | ICD-10-CM | POA: Diagnosis not present

## 2022-02-03 DIAGNOSIS — Z880 Allergy status to penicillin: Secondary | ICD-10-CM | POA: Diagnosis not present

## 2022-02-03 DIAGNOSIS — Z91048 Other nonmedicinal substance allergy status: Secondary | ICD-10-CM | POA: Diagnosis not present

## 2022-02-03 DIAGNOSIS — L97518 Non-pressure chronic ulcer of other part of right foot with other specified severity: Secondary | ICD-10-CM | POA: Diagnosis not present

## 2022-02-03 DIAGNOSIS — Z882 Allergy status to sulfonamides status: Secondary | ICD-10-CM | POA: Diagnosis not present

## 2022-02-03 DIAGNOSIS — F419 Anxiety disorder, unspecified: Secondary | ICD-10-CM | POA: Diagnosis not present

## 2022-02-03 DIAGNOSIS — Z7982 Long term (current) use of aspirin: Secondary | ICD-10-CM | POA: Diagnosis not present

## 2022-02-03 DIAGNOSIS — L97919 Non-pressure chronic ulcer of unspecified part of right lower leg with unspecified severity: Secondary | ICD-10-CM | POA: Diagnosis not present

## 2022-02-03 DIAGNOSIS — J449 Chronic obstructive pulmonary disease, unspecified: Secondary | ICD-10-CM | POA: Diagnosis not present

## 2022-02-03 DIAGNOSIS — Z7984 Long term (current) use of oral hypoglycemic drugs: Secondary | ICD-10-CM | POA: Diagnosis not present

## 2022-02-03 DIAGNOSIS — E11622 Type 2 diabetes mellitus with other skin ulcer: Secondary | ICD-10-CM | POA: Diagnosis not present

## 2022-02-03 DIAGNOSIS — Z7951 Long term (current) use of inhaled steroids: Secondary | ICD-10-CM | POA: Diagnosis not present

## 2022-02-03 DIAGNOSIS — I10 Essential (primary) hypertension: Secondary | ICD-10-CM | POA: Diagnosis not present

## 2022-02-03 DIAGNOSIS — I890 Lymphedema, not elsewhere classified: Secondary | ICD-10-CM | POA: Diagnosis not present

## 2022-02-03 DIAGNOSIS — Z79899 Other long term (current) drug therapy: Secondary | ICD-10-CM | POA: Diagnosis not present

## 2022-02-03 DIAGNOSIS — E11621 Type 2 diabetes mellitus with foot ulcer: Secondary | ICD-10-CM | POA: Diagnosis not present

## 2022-02-03 DIAGNOSIS — F32A Depression, unspecified: Secondary | ICD-10-CM | POA: Diagnosis not present

## 2022-02-16 DIAGNOSIS — I10 Essential (primary) hypertension: Secondary | ICD-10-CM | POA: Diagnosis not present

## 2022-02-16 DIAGNOSIS — F322 Major depressive disorder, single episode, severe without psychotic features: Secondary | ICD-10-CM | POA: Diagnosis not present

## 2022-02-16 DIAGNOSIS — E1169 Type 2 diabetes mellitus with other specified complication: Secondary | ICD-10-CM | POA: Diagnosis not present

## 2022-02-16 DIAGNOSIS — F419 Anxiety disorder, unspecified: Secondary | ICD-10-CM | POA: Diagnosis not present

## 2022-02-17 DIAGNOSIS — Z79899 Other long term (current) drug therapy: Secondary | ICD-10-CM | POA: Diagnosis not present

## 2022-02-17 DIAGNOSIS — L97518 Non-pressure chronic ulcer of other part of right foot with other specified severity: Secondary | ICD-10-CM | POA: Diagnosis not present

## 2022-02-17 DIAGNOSIS — L97919 Non-pressure chronic ulcer of unspecified part of right lower leg with unspecified severity: Secondary | ICD-10-CM | POA: Diagnosis not present

## 2022-02-17 DIAGNOSIS — E11621 Type 2 diabetes mellitus with foot ulcer: Secondary | ICD-10-CM | POA: Diagnosis not present

## 2022-02-17 DIAGNOSIS — Z882 Allergy status to sulfonamides status: Secondary | ICD-10-CM | POA: Diagnosis not present

## 2022-02-17 DIAGNOSIS — I87311 Chronic venous hypertension (idiopathic) with ulcer of right lower extremity: Secondary | ICD-10-CM | POA: Diagnosis not present

## 2022-02-17 DIAGNOSIS — Z91048 Other nonmedicinal substance allergy status: Secondary | ICD-10-CM | POA: Diagnosis not present

## 2022-02-17 DIAGNOSIS — Z7951 Long term (current) use of inhaled steroids: Secondary | ICD-10-CM | POA: Diagnosis not present

## 2022-02-17 DIAGNOSIS — E11622 Type 2 diabetes mellitus with other skin ulcer: Secondary | ICD-10-CM | POA: Diagnosis not present

## 2022-02-17 DIAGNOSIS — Z880 Allergy status to penicillin: Secondary | ICD-10-CM | POA: Diagnosis not present

## 2022-02-17 DIAGNOSIS — F32A Depression, unspecified: Secondary | ICD-10-CM | POA: Diagnosis not present

## 2022-02-17 DIAGNOSIS — I890 Lymphedema, not elsewhere classified: Secondary | ICD-10-CM | POA: Diagnosis not present

## 2022-02-17 DIAGNOSIS — Z7982 Long term (current) use of aspirin: Secondary | ICD-10-CM | POA: Diagnosis not present

## 2022-02-17 DIAGNOSIS — Z7984 Long term (current) use of oral hypoglycemic drugs: Secondary | ICD-10-CM | POA: Diagnosis not present

## 2022-02-17 DIAGNOSIS — I10 Essential (primary) hypertension: Secondary | ICD-10-CM | POA: Diagnosis not present

## 2022-02-17 DIAGNOSIS — J449 Chronic obstructive pulmonary disease, unspecified: Secondary | ICD-10-CM | POA: Diagnosis not present

## 2022-02-17 DIAGNOSIS — F419 Anxiety disorder, unspecified: Secondary | ICD-10-CM | POA: Diagnosis not present

## 2022-02-19 DIAGNOSIS — E7800 Pure hypercholesterolemia, unspecified: Secondary | ICD-10-CM | POA: Diagnosis not present

## 2022-02-19 DIAGNOSIS — E118 Type 2 diabetes mellitus with unspecified complications: Secondary | ICD-10-CM | POA: Diagnosis not present

## 2022-02-19 DIAGNOSIS — I10 Essential (primary) hypertension: Secondary | ICD-10-CM | POA: Diagnosis not present

## 2022-02-27 DIAGNOSIS — E1151 Type 2 diabetes mellitus with diabetic peripheral angiopathy without gangrene: Secondary | ICD-10-CM | POA: Diagnosis not present

## 2022-02-27 DIAGNOSIS — Z7982 Long term (current) use of aspirin: Secondary | ICD-10-CM | POA: Diagnosis not present

## 2022-02-27 DIAGNOSIS — Z79899 Other long term (current) drug therapy: Secondary | ICD-10-CM | POA: Diagnosis not present

## 2022-02-27 DIAGNOSIS — Z7951 Long term (current) use of inhaled steroids: Secondary | ICD-10-CM | POA: Diagnosis not present

## 2022-02-27 DIAGNOSIS — F419 Anxiety disorder, unspecified: Secondary | ICD-10-CM | POA: Diagnosis not present

## 2022-02-27 DIAGNOSIS — L97815 Non-pressure chronic ulcer of other part of right lower leg with muscle involvement without evidence of necrosis: Secondary | ICD-10-CM | POA: Diagnosis not present

## 2022-02-27 DIAGNOSIS — I890 Lymphedema, not elsewhere classified: Secondary | ICD-10-CM | POA: Diagnosis not present

## 2022-02-27 DIAGNOSIS — J449 Chronic obstructive pulmonary disease, unspecified: Secondary | ICD-10-CM | POA: Diagnosis not present

## 2022-02-27 DIAGNOSIS — F32A Depression, unspecified: Secondary | ICD-10-CM | POA: Diagnosis not present

## 2022-02-27 DIAGNOSIS — I872 Venous insufficiency (chronic) (peripheral): Secondary | ICD-10-CM | POA: Diagnosis not present

## 2022-02-27 DIAGNOSIS — Z7984 Long term (current) use of oral hypoglycemic drugs: Secondary | ICD-10-CM | POA: Diagnosis not present

## 2022-02-27 DIAGNOSIS — Z4800 Encounter for change or removal of nonsurgical wound dressing: Secondary | ICD-10-CM | POA: Diagnosis not present

## 2022-02-27 DIAGNOSIS — I10 Essential (primary) hypertension: Secondary | ICD-10-CM | POA: Diagnosis not present

## 2022-03-24 DIAGNOSIS — Z7982 Long term (current) use of aspirin: Secondary | ICD-10-CM | POA: Diagnosis not present

## 2022-03-24 DIAGNOSIS — J449 Chronic obstructive pulmonary disease, unspecified: Secondary | ICD-10-CM | POA: Diagnosis not present

## 2022-03-24 DIAGNOSIS — Z79899 Other long term (current) drug therapy: Secondary | ICD-10-CM | POA: Diagnosis not present

## 2022-03-24 DIAGNOSIS — L97919 Non-pressure chronic ulcer of unspecified part of right lower leg with unspecified severity: Secondary | ICD-10-CM | POA: Diagnosis not present

## 2022-03-24 DIAGNOSIS — E11622 Type 2 diabetes mellitus with other skin ulcer: Secondary | ICD-10-CM | POA: Diagnosis not present

## 2022-03-24 DIAGNOSIS — Z881 Allergy status to other antibiotic agents status: Secondary | ICD-10-CM | POA: Diagnosis not present

## 2022-03-24 DIAGNOSIS — I10 Essential (primary) hypertension: Secondary | ICD-10-CM | POA: Diagnosis not present

## 2022-03-24 DIAGNOSIS — Z7984 Long term (current) use of oral hypoglycemic drugs: Secondary | ICD-10-CM | POA: Diagnosis not present

## 2022-03-24 DIAGNOSIS — Z882 Allergy status to sulfonamides status: Secondary | ICD-10-CM | POA: Diagnosis not present

## 2022-03-29 DIAGNOSIS — I10 Essential (primary) hypertension: Secondary | ICD-10-CM | POA: Diagnosis not present

## 2022-03-29 DIAGNOSIS — J449 Chronic obstructive pulmonary disease, unspecified: Secondary | ICD-10-CM | POA: Diagnosis not present

## 2022-03-29 DIAGNOSIS — I890 Lymphedema, not elsewhere classified: Secondary | ICD-10-CM | POA: Diagnosis not present

## 2022-03-29 DIAGNOSIS — Z4800 Encounter for change or removal of nonsurgical wound dressing: Secondary | ICD-10-CM | POA: Diagnosis not present

## 2022-03-29 DIAGNOSIS — Z7951 Long term (current) use of inhaled steroids: Secondary | ICD-10-CM | POA: Diagnosis not present

## 2022-03-29 DIAGNOSIS — Z79899 Other long term (current) drug therapy: Secondary | ICD-10-CM | POA: Diagnosis not present

## 2022-03-29 DIAGNOSIS — L97815 Non-pressure chronic ulcer of other part of right lower leg with muscle involvement without evidence of necrosis: Secondary | ICD-10-CM | POA: Diagnosis not present

## 2022-03-29 DIAGNOSIS — E1151 Type 2 diabetes mellitus with diabetic peripheral angiopathy without gangrene: Secondary | ICD-10-CM | POA: Diagnosis not present

## 2022-03-29 DIAGNOSIS — F32A Depression, unspecified: Secondary | ICD-10-CM | POA: Diagnosis not present

## 2022-03-29 DIAGNOSIS — Z7984 Long term (current) use of oral hypoglycemic drugs: Secondary | ICD-10-CM | POA: Diagnosis not present

## 2022-03-29 DIAGNOSIS — I872 Venous insufficiency (chronic) (peripheral): Secondary | ICD-10-CM | POA: Diagnosis not present

## 2022-03-29 DIAGNOSIS — Z7982 Long term (current) use of aspirin: Secondary | ICD-10-CM | POA: Diagnosis not present

## 2022-03-29 DIAGNOSIS — F419 Anxiety disorder, unspecified: Secondary | ICD-10-CM | POA: Diagnosis not present

## 2022-04-10 DIAGNOSIS — E118 Type 2 diabetes mellitus with unspecified complications: Secondary | ICD-10-CM | POA: Diagnosis not present

## 2022-04-10 DIAGNOSIS — E7800 Pure hypercholesterolemia, unspecified: Secondary | ICD-10-CM | POA: Diagnosis not present

## 2022-04-10 DIAGNOSIS — J449 Chronic obstructive pulmonary disease, unspecified: Secondary | ICD-10-CM | POA: Diagnosis not present

## 2022-04-10 DIAGNOSIS — I10 Essential (primary) hypertension: Secondary | ICD-10-CM | POA: Diagnosis not present

## 2022-05-18 DIAGNOSIS — G4700 Insomnia, unspecified: Secondary | ICD-10-CM | POA: Diagnosis not present

## 2022-05-18 DIAGNOSIS — R54 Age-related physical debility: Secondary | ICD-10-CM | POA: Diagnosis not present

## 2022-05-18 DIAGNOSIS — J069 Acute upper respiratory infection, unspecified: Secondary | ICD-10-CM | POA: Diagnosis not present

## 2022-08-17 DIAGNOSIS — I10 Essential (primary) hypertension: Secondary | ICD-10-CM | POA: Diagnosis not present

## 2022-08-17 DIAGNOSIS — R8290 Unspecified abnormal findings in urine: Secondary | ICD-10-CM | POA: Diagnosis not present

## 2022-08-17 DIAGNOSIS — E7800 Pure hypercholesterolemia, unspecified: Secondary | ICD-10-CM | POA: Diagnosis not present

## 2022-08-17 DIAGNOSIS — F419 Anxiety disorder, unspecified: Secondary | ICD-10-CM | POA: Diagnosis not present

## 2022-08-17 DIAGNOSIS — E119 Type 2 diabetes mellitus without complications: Secondary | ICD-10-CM | POA: Diagnosis not present

## 2022-08-17 DIAGNOSIS — E1169 Type 2 diabetes mellitus with other specified complication: Secondary | ICD-10-CM | POA: Diagnosis not present

## 2022-08-17 DIAGNOSIS — I700 Atherosclerosis of aorta: Secondary | ICD-10-CM | POA: Diagnosis not present

## 2022-08-17 DIAGNOSIS — R54 Age-related physical debility: Secondary | ICD-10-CM | POA: Diagnosis not present

## 2022-08-17 DIAGNOSIS — J309 Allergic rhinitis, unspecified: Secondary | ICD-10-CM | POA: Diagnosis not present

## 2022-08-17 DIAGNOSIS — J449 Chronic obstructive pulmonary disease, unspecified: Secondary | ICD-10-CM | POA: Diagnosis not present

## 2022-10-12 DIAGNOSIS — E119 Type 2 diabetes mellitus without complications: Secondary | ICD-10-CM | POA: Diagnosis not present

## 2023-01-11 ENCOUNTER — Emergency Department (HOSPITAL_COMMUNITY): Payer: Medicare Other

## 2023-01-11 ENCOUNTER — Inpatient Hospital Stay (HOSPITAL_COMMUNITY)
Admission: EM | Admit: 2023-01-11 | Discharge: 2023-01-15 | DRG: 557 | Disposition: A | Discharge status: Skilled Nursing Facility | Payer: Medicare Other | Attending: Internal Medicine | Admitting: Internal Medicine

## 2023-01-11 DIAGNOSIS — M1990 Unspecified osteoarthritis, unspecified site: Secondary | ICD-10-CM | POA: Diagnosis not present

## 2023-01-11 DIAGNOSIS — S06339D Contusion and laceration of cerebrum, unspecified, with loss of consciousness of unspecified duration, subsequent encounter: Secondary | ICD-10-CM | POA: Diagnosis not present

## 2023-01-11 DIAGNOSIS — R41841 Cognitive communication deficit: Secondary | ICD-10-CM | POA: Diagnosis not present

## 2023-01-11 DIAGNOSIS — E876 Hypokalemia: Secondary | ICD-10-CM | POA: Diagnosis not present

## 2023-01-11 DIAGNOSIS — S8992XA Unspecified injury of left lower leg, initial encounter: Secondary | ICD-10-CM | POA: Diagnosis not present

## 2023-01-11 DIAGNOSIS — R7401 Elevation of levels of liver transaminase levels: Secondary | ICD-10-CM | POA: Diagnosis not present

## 2023-01-11 DIAGNOSIS — M7732 Calcaneal spur, left foot: Secondary | ICD-10-CM | POA: Diagnosis not present

## 2023-01-11 DIAGNOSIS — W19XXXA Unspecified fall, initial encounter: Principal | ICD-10-CM

## 2023-01-11 DIAGNOSIS — W19XXXD Unspecified fall, subsequent encounter: Secondary | ICD-10-CM | POA: Diagnosis not present

## 2023-01-11 DIAGNOSIS — Z8261 Family history of arthritis: Secondary | ICD-10-CM | POA: Diagnosis not present

## 2023-01-11 DIAGNOSIS — Z96642 Presence of left artificial hip joint: Secondary | ICD-10-CM | POA: Diagnosis not present

## 2023-01-11 DIAGNOSIS — R0981 Nasal congestion: Secondary | ICD-10-CM | POA: Diagnosis present

## 2023-01-11 DIAGNOSIS — Z888 Allergy status to other drugs, medicaments and biological substances status: Secondary | ICD-10-CM

## 2023-01-11 DIAGNOSIS — E1159 Type 2 diabetes mellitus with other circulatory complications: Secondary | ICD-10-CM | POA: Diagnosis present

## 2023-01-11 DIAGNOSIS — Z7982 Long term (current) use of aspirin: Secondary | ICD-10-CM

## 2023-01-11 DIAGNOSIS — E871 Hypo-osmolality and hyponatremia: Secondary | ICD-10-CM | POA: Diagnosis not present

## 2023-01-11 DIAGNOSIS — E785 Hyperlipidemia, unspecified: Secondary | ICD-10-CM | POA: Diagnosis not present

## 2023-01-11 DIAGNOSIS — R0789 Other chest pain: Secondary | ICD-10-CM | POA: Diagnosis present

## 2023-01-11 DIAGNOSIS — S062XAA Diffuse traumatic brain injury with loss of consciousness status unknown, initial encounter: Secondary | ICD-10-CM

## 2023-01-11 DIAGNOSIS — N390 Urinary tract infection, site not specified: Secondary | ICD-10-CM | POA: Diagnosis not present

## 2023-01-11 DIAGNOSIS — M25462 Effusion, left knee: Secondary | ICD-10-CM | POA: Diagnosis not present

## 2023-01-11 DIAGNOSIS — J309 Allergic rhinitis, unspecified: Secondary | ICD-10-CM | POA: Diagnosis not present

## 2023-01-11 DIAGNOSIS — G9341 Metabolic encephalopathy: Secondary | ICD-10-CM | POA: Diagnosis present

## 2023-01-11 DIAGNOSIS — R2681 Unsteadiness on feet: Secondary | ICD-10-CM | POA: Diagnosis not present

## 2023-01-11 DIAGNOSIS — Z7984 Long term (current) use of oral hypoglycemic drugs: Secondary | ICD-10-CM | POA: Diagnosis not present

## 2023-01-11 DIAGNOSIS — F419 Anxiety disorder, unspecified: Secondary | ICD-10-CM | POA: Diagnosis not present

## 2023-01-11 DIAGNOSIS — W08XXXA Fall from other furniture, initial encounter: Secondary | ICD-10-CM | POA: Diagnosis present

## 2023-01-11 DIAGNOSIS — Z043 Encounter for examination and observation following other accident: Secondary | ICD-10-CM | POA: Diagnosis not present

## 2023-01-11 DIAGNOSIS — M6282 Rhabdomyolysis: Principal | ICD-10-CM | POA: Diagnosis present

## 2023-01-11 DIAGNOSIS — I152 Hypertension secondary to endocrine disorders: Secondary | ICD-10-CM | POA: Diagnosis present

## 2023-01-11 DIAGNOSIS — J439 Emphysema, unspecified: Secondary | ICD-10-CM | POA: Diagnosis not present

## 2023-01-11 DIAGNOSIS — M160 Bilateral primary osteoarthritis of hip: Secondary | ICD-10-CM | POA: Diagnosis present

## 2023-01-11 DIAGNOSIS — F418 Other specified anxiety disorders: Secondary | ICD-10-CM | POA: Diagnosis not present

## 2023-01-11 DIAGNOSIS — S062X0A Diffuse traumatic brain injury without loss of consciousness, initial encounter: Secondary | ICD-10-CM | POA: Diagnosis not present

## 2023-01-11 DIAGNOSIS — Z833 Family history of diabetes mellitus: Secondary | ICD-10-CM | POA: Diagnosis not present

## 2023-01-11 DIAGNOSIS — Z882 Allergy status to sulfonamides status: Secondary | ICD-10-CM

## 2023-01-11 DIAGNOSIS — F32A Depression, unspecified: Secondary | ICD-10-CM | POA: Diagnosis present

## 2023-01-11 DIAGNOSIS — G8929 Other chronic pain: Secondary | ICD-10-CM | POA: Diagnosis present

## 2023-01-11 DIAGNOSIS — Z8249 Family history of ischemic heart disease and other diseases of the circulatory system: Secondary | ICD-10-CM

## 2023-01-11 DIAGNOSIS — N3000 Acute cystitis without hematuria: Secondary | ICD-10-CM | POA: Diagnosis not present

## 2023-01-11 DIAGNOSIS — T796XXA Traumatic ischemia of muscle, initial encounter: Secondary | ICD-10-CM | POA: Diagnosis not present

## 2023-01-11 DIAGNOSIS — M1611 Unilateral primary osteoarthritis, right hip: Secondary | ICD-10-CM | POA: Diagnosis not present

## 2023-01-11 DIAGNOSIS — I610 Nontraumatic intracerebral hemorrhage in hemisphere, subcortical: Secondary | ICD-10-CM | POA: Diagnosis not present

## 2023-01-11 DIAGNOSIS — E860 Dehydration: Secondary | ICD-10-CM | POA: Diagnosis not present

## 2023-01-11 DIAGNOSIS — Z79899 Other long term (current) drug therapy: Secondary | ICD-10-CM

## 2023-01-11 DIAGNOSIS — J310 Chronic rhinitis: Secondary | ICD-10-CM | POA: Diagnosis present

## 2023-01-11 DIAGNOSIS — E119 Type 2 diabetes mellitus without complications: Secondary | ICD-10-CM | POA: Diagnosis not present

## 2023-01-11 DIAGNOSIS — R519 Headache, unspecified: Secondary | ICD-10-CM | POA: Diagnosis not present

## 2023-01-11 DIAGNOSIS — M25569 Pain in unspecified knee: Secondary | ICD-10-CM | POA: Diagnosis not present

## 2023-01-11 DIAGNOSIS — S0990XA Unspecified injury of head, initial encounter: Secondary | ICD-10-CM | POA: Diagnosis not present

## 2023-01-11 DIAGNOSIS — M1712 Unilateral primary osteoarthritis, left knee: Secondary | ICD-10-CM | POA: Diagnosis not present

## 2023-01-11 DIAGNOSIS — S199XXA Unspecified injury of neck, initial encounter: Secondary | ICD-10-CM | POA: Diagnosis not present

## 2023-01-11 DIAGNOSIS — I10 Essential (primary) hypertension: Secondary | ICD-10-CM | POA: Diagnosis not present

## 2023-01-11 DIAGNOSIS — E1169 Type 2 diabetes mellitus with other specified complication: Secondary | ICD-10-CM | POA: Diagnosis not present

## 2023-01-11 DIAGNOSIS — E43 Unspecified severe protein-calorie malnutrition: Secondary | ICD-10-CM | POA: Diagnosis present

## 2023-01-11 DIAGNOSIS — J449 Chronic obstructive pulmonary disease, unspecified: Secondary | ICD-10-CM | POA: Diagnosis not present

## 2023-01-11 DIAGNOSIS — I700 Atherosclerosis of aorta: Secondary | ICD-10-CM | POA: Diagnosis not present

## 2023-01-11 DIAGNOSIS — R233 Spontaneous ecchymoses: Secondary | ICD-10-CM | POA: Diagnosis present

## 2023-01-11 DIAGNOSIS — Z880 Allergy status to penicillin: Secondary | ICD-10-CM

## 2023-01-11 DIAGNOSIS — G319 Degenerative disease of nervous system, unspecified: Secondary | ICD-10-CM | POA: Diagnosis not present

## 2023-01-11 DIAGNOSIS — R1312 Dysphagia, oropharyngeal phase: Secondary | ICD-10-CM | POA: Diagnosis not present

## 2023-01-11 DIAGNOSIS — Y92009 Unspecified place in unspecified non-institutional (private) residence as the place of occurrence of the external cause: Secondary | ICD-10-CM

## 2023-01-11 DIAGNOSIS — R600 Localized edema: Secondary | ICD-10-CM | POA: Diagnosis not present

## 2023-01-11 DIAGNOSIS — S06339A Contusion and laceration of cerebrum, unspecified, with loss of consciousness of unspecified duration, initial encounter: Secondary | ICD-10-CM

## 2023-01-11 DIAGNOSIS — Z825 Family history of asthma and other chronic lower respiratory diseases: Secondary | ICD-10-CM | POA: Diagnosis not present

## 2023-01-11 DIAGNOSIS — K5900 Constipation, unspecified: Secondary | ICD-10-CM | POA: Diagnosis not present

## 2023-01-11 DIAGNOSIS — Z881 Allergy status to other antibiotic agents status: Secondary | ICD-10-CM

## 2023-01-11 DIAGNOSIS — R4182 Altered mental status, unspecified: Secondary | ICD-10-CM | POA: Diagnosis not present

## 2023-01-11 DIAGNOSIS — S06330A Contusion and laceration of cerebrum, unspecified, without loss of consciousness, initial encounter: Secondary | ICD-10-CM | POA: Diagnosis not present

## 2023-01-11 LAB — CBC WITH DIFFERENTIAL/PLATELET
Abs Immature Granulocytes: 0.05 10*3/uL (ref 0.00–0.07)
Basophils Absolute: 0 10*3/uL (ref 0.0–0.1)
Basophils Relative: 0 %
Eosinophils Absolute: 0 10*3/uL (ref 0.0–0.5)
Eosinophils Relative: 0 %
HCT: 40.9 % (ref 36.0–46.0)
Hemoglobin: 14.1 g/dL (ref 12.0–15.0)
Immature Granulocytes: 0 %
Lymphocytes Relative: 11 %
Lymphs Abs: 1.6 10*3/uL (ref 0.7–4.0)
MCH: 32.4 pg (ref 26.0–34.0)
MCHC: 34.5 g/dL (ref 30.0–36.0)
MCV: 94 fL (ref 80.0–100.0)
Monocytes Absolute: 1 10*3/uL (ref 0.1–1.0)
Monocytes Relative: 7 %
Neutro Abs: 11.1 10*3/uL — ABNORMAL HIGH (ref 1.7–7.7)
Neutrophils Relative %: 82 %
Platelets: 286 10*3/uL (ref 150–400)
RBC: 4.35 MIL/uL (ref 3.87–5.11)
RDW: 12.9 % (ref 11.5–15.5)
WBC: 13.8 10*3/uL — ABNORMAL HIGH (ref 4.0–10.5)
nRBC: 0 % (ref 0.0–0.2)

## 2023-01-11 MED ORDER — SODIUM CHLORIDE 0.9 % IV SOLN: INTRAVENOUS | Status: DC

## 2023-01-11 NOTE — ED Triage Notes (Signed)
Pt brought in by [facility] for c/o altered mental status and recent falls. EMS states pt had a fall last night and family found her this morning and after they helped her up she was sitting on the couch and fell again

## 2023-01-11 NOTE — ED Provider Notes (Addendum)
[place] EMERGENCY DEPARTMENT AT [facility] Provider Note   CSN: [id number] Arrival date & time: 01/11/23  5621     History  Chief Complaint  Patient presents with   [name] [name] is a 89 y.o. female.  Patient brought in by family members.  Patient had 2 falls.  Patient had lay on the floor overnight.  Family brought her in.  They are concerned that she is falling too frequently.  She does live by herself.  Was brought in by EMS.  They are concerned about altered mental status and the frequent falls.  Patient with complaint of some leg pain hip pain.  Family found her this morning after they helped her up and she was sitting on the couch and fell again.  They did not describe any loss of consciousness.  Pat temp here is 98.3 blood pressure 134/55 oxygen saturations on room air 98% heart rate 67 respirations 18.  Patient is not on any blood thinners.  Past medical history significant for depressive disorder hypertension COPD diabetes.  Patient is never used tobacco products.  Last seen by [place] in September 2023 for a wound check.     Home Medications Prior to Admission medications   Medication Sig Start Date End Date Taking? Authorizing Provider  albuterol (PROVENTIL) (2.5 MG/3ML) 0.083% nebulizer solution NEBULIZE 1 VIAL EVERY 6 HOURS AS NEEDED FOR SHORTNESS OF BREATH OR WHEEZING Patient taking differently: Take 2.5 mg by nebulization every 6 (six) hours as needed for wheezing or shortness of breath.  09/19/18   Daphine Deutscher, Mary-Margaret, FNP  ALPRAZolam Prudy Feeler) 0.25 MG tablet Take 1 tablet (0.25 mg total) by mouth 2 (two) times daily as needed for anxiety. 04/28/19   Bethann Berkshire, MD  amLODipine (NORVASC) 2.5 MG tablet Take 1 tablet (2.5 mg total) by mouth daily. 03/20/19   Dettinger, Elige Radon, MD  aspirin 81 MG tablet Take 81 mg by mouth every other day.    [provider]  bacitracin ointment Apply 1 Application topically 2 (two) times daily. 12/16/21    Peter Garter, PA  blood glucose meter kit and supplies KIT Check BS once daily 12/12/15   Ernestina Penna, MD  Blood Glucose Monitoring Suppl [facility] VERIO FLEX SYSTEM) w/Device KIT CHECK GLUCOSE TWICE DAILY AS NEEDED Dx E11.9 03/13/19   Sonny Masters, FNP  budesonide-formoterol (SYMBICORT) 160-4.5 MCG/ACT inhaler Inhale 2 puffs into the lungs 2 (two) times daily. 10/05/17   Dettinger, Elige Radon, MD  cholecalciferol (VITAMIN D) 1000 UNITS tablet Take 2,000 Units by mouth daily.    [provider]  cloNIDine (CATAPRES) 0.1 MG tablet Take 1 tablet (0.1 mg total) by mouth at bedtime as needed. 05/19/19   Sonny Masters, FNP  doxycycline (VIBRAMYCIN) 100 MG capsule Take 1 capsule (100 mg total) by mouth 2 (two) times daily. 12/16/21   Sherian Maroon A, PA  escitalopram (LEXAPRO) 20 MG tablet Take 1 tablet (20 mg total) by mouth daily. as directed 03/20/19   Dettinger, Elige Radon, MD  fluticasone Aleda Grana) 50 MCG/ACT nasal spray USE ONE SPRAY in each nostril ONCE daily 05/19/19   Rakes, Doralee Albino, FNP  gabapentin (NEURONTIN) 100 MG capsule Take 1 capsule (100 mg total) by mouth at bedtime. 02/27/20   Persons, [place], PA  glucose blood (ONETOUCH VERIO) test strip CHECK GLUCOSE TWICE DAILY AS NEEDED Dx E11.9 03/13/19   Sonny Masters, FNP  ketoconazole (NIZORAL) 2 % shampoo Apply 1 application topically  2 (two) times a week. 05/26/18   Bennie Pierini, FNP  Lancet Devices (ONE TOUCH DELICA LANCING DEV) MISC Use to check BG up to twice a day. DX: E11.29 - type 2 DM 09/02/16   Ernestina Penna, MD  Lancets [facility] ULTRASOFT) lancets CHECK GLUCOSE TWICE DAILY AS NEEDED Dx E11.9 03/13/19   Sonny Masters, FNP  lovastatin (MEVACOR) 40 MG tablet Take 1 tablet (40 mg total) by mouth daily. 03/20/19   Dettinger, Elige Radon, MD  Melatonin 5 MG CAPS Take 1 capsule (5 mg total) by mouth at bedtime. 03/20/19   Dettinger, Elige Radon, MD  metFORMIN (GLUCOPHAGE-XR) 500 MG 24 hr tablet 1 tablet once daily 03/20/19    Dettinger, Elige Radon, MD  Omega-3 Fatty Acids (FISH OIL) 1000 MG CAPS Take by mouth.    [provider]  predniSONE (DELTASONE) 10 MG tablet Take 1 tablet (10 mg total) by mouth daily with breakfast. 02/27/20   Persons, [place], PA  triamcinolone cream (KENALOG) 0.1 % Apply 1 application topically 2 (two) times daily. 02/20/19   Sonny Masters, FNP      Allergies    Lisinopril, Chlordiazepoxide-clidinium, Clarithromycin, Penicillins, Betadine [povidone iodine], Erythromycin, Evista [raloxifene hydrochloride], Hydrogen peroxide, Sertraline hcl, and Sulfa antibiotics    Review of Systems   Review of Systems  Unable to perform ROS: Mental status change    Physical Exam Updated Vital Signs BP (!) 137/48   Pulse 64   Temp 98.3 F (36.8 C)   Resp 15   SpO2 94%  Physical Exam Vitals and nursing note reviewed.  Constitutional:      General: She is not in acute distress.    Appearance: Normal appearance. She is well-developed. She is not ill-appearing.  HENT:     Head: Normocephalic and atraumatic.     Mouth/Throat:     Mouth: Mucous membranes are dry.  Eyes:     Extraocular Movements: Extraocular movements intact.     Conjunctiva/sclera: Conjunctivae normal.     Pupils: Pupils are equal, round, and reactive to light.  Neck:     Comments: No posterior tenderness to cervical spine. Cardiovascular:     Rate and Rhythm: Normal rate and regular rhythm.     Heart sounds: No murmur heard. Pulmonary:     Effort: Pulmonary effort is normal. No respiratory distress.     Breath sounds: Normal breath sounds.  Abdominal:     General: There is no distension.     Palpations: Abdomen is soft.     Tenderness: There is no abdominal tenderness.  Musculoskeletal:        General: Signs of injury present. No swelling.     Cervical back: Neck supple. No rigidity or tenderness.     Comments: Patient will be a deformity to the left knee.'s have some bruising and swelling to the left knee.   Also lateral aspect of the ankle has a bruise on it.  And some swelling.  Neurovascularly intact distally on upper extremities and and lower extremities.  Also some discomfort to the right knee area.  And seems to be some bilateral discomfort to movement of both hips but no obvious deformity or leg shortening or rotation.  Upper extremity seemed fine.  Palpation to the thoracic and lumbar back without any tenderness.  Skin:    General: Skin is warm and dry.     Capillary Refill: Capillary refill takes less than 2 seconds.  Neurological:     Mental Status: She  is alert.     Comments: Patient is awake and alert and will follow commands.  But does seem to have some degree of confusion.  Psychiatric:        Mood and Affect: Mood normal.     ED Results / Procedures / Treatments   Labs (all labs ordered are listed, but only abnormal results are displayed) Labs Reviewed  CBC WITH DIFFERENTIAL/PLATELET - Abnormal; Notable for the following components:      Result Value   WBC 13.8 (*)    Neutro Abs 11.1 (*)    All other components within normal limits  CK  COMPREHENSIVE METABOLIC PANEL  URINALYSIS, ROUTINE W REFLEX MICROSCOPIC    EKG EKG Interpretation Date/Time:  Monday January 11 2023 19:00:29 EDT Ventricular Rate:  68 PR Interval:  169 QRS Duration:  97 QT Interval:  445 QTC Calculation: 474 R Axis:   -53  Text Interpretation: No significant change since last tracing Sinus rhythm Left anterior fascicular block Anterior infarct, old Confirmed by Vanetta Mulders [id number]) on 01/11/2023 10:35:17 PM  Radiology DG Chest Port 1 View  Result Date: 01/11/2023 CLINICAL DATA:  Fall. EXAM: PORTABLE CHEST 1 VIEW COMPARISON:  Chest radiograph dated 10/09/2021. FINDINGS: There is background of emphysema and mild chronic interstitial coarsening. No focal consolidation, pleural effusion, or pneumothorax. The cardiac silhouette is within limits. Atherosclerotic calcification of the aorta.  Degenerative changes of spine. No acute osseous pathology IMPRESSION: No active disease. Electronically Signed   By: Elgie Collard M.D.   On: 01/11/2023 23:34    Procedures Procedures    Medications Ordered in ED Medications  0.9 %  sodium chloride infusion (has no administration in time range)    ED Course/ Medical Decision Making/ A&P                                 Medical Decision Making Amount and/or Complexity of Data Reviewed Labs: ordered. Radiology: ordered.  Risk Prescription drug management.   Chest x-ray without any acute findings.  CBC white count up a little bit at 13.8 hemoglobin is 14.1 platelets are normal at 286.  Complete metabolic panel pending.  CT head and CT cervical spine is pending.  As well as x-rays of the left knee left ankle and right knee and both hips.  Patient also has CK that is pending.  And urinalysis that is pending.  Will give IV fluids.  Will have the overnight physician follow-up with patient x-rays and rest the lab results.  And then reassess.  Patient certainly at risk for the CK being markedly elevated.  In summary CT head and neck is pending x-rays of both hips and pelvis pending x-ray of both knees pending an x-ray of left ankle pending.  Labs that are pending her CK complete metabolic panel urinalysis.   Final Clinical Impression(s) / ED Diagnoses Final diagnoses:  Fall, initial encounter    Rx / DC Orders ED Discharge Orders     None         Vanetta Mulders, MD 01/12/23 Salley Hews    Vanetta Mulders, MD 01/12/23 [phone]

## 2023-01-11 NOTE — ED Provider Notes (Incomplete)
[place] EMERGENCY DEPARTMENT AT [facility] Provider Note   CSN: [id number] Arrival date & time: 01/11/23  7829     History {Add pertinent medical, surgical, social history, OB history to HPI:1} Chief Complaint  Patient presents with   [name] [name] is a 89 y.o. female.  HPI     Home Medications Prior to Admission medications   Medication Sig Start Date End Date Taking? Authorizing Provider  albuterol (PROVENTIL) (2.5 MG/3ML) 0.083% nebulizer solution NEBULIZE 1 VIAL EVERY 6 HOURS AS NEEDED FOR SHORTNESS OF BREATH OR WHEEZING Patient taking differently: Take 2.5 mg by nebulization every 6 (six) hours as needed for wheezing or shortness of breath.  09/19/18   Daphine Deutscher, Mary-Margaret, FNP  ALPRAZolam Prudy Feeler) 0.25 MG tablet Take 1 tablet (0.25 mg total) by mouth 2 (two) times daily as needed for anxiety. 04/28/19   Bethann Berkshire, MD  amLODipine (NORVASC) 2.5 MG tablet Take 1 tablet (2.5 mg total) by mouth daily. 03/20/19   Dettinger, Elige Radon, MD  aspirin 81 MG tablet Take 81 mg by mouth every other day.    [provider]  bacitracin ointment Apply 1 Application topically 2 (two) times daily. 12/16/21   Peter Garter, PA  blood glucose meter kit and supplies KIT Check BS once daily 12/12/15   Ernestina Penna, MD  Blood Glucose Monitoring Suppl [facility] VERIO FLEX SYSTEM) w/Device KIT CHECK GLUCOSE TWICE DAILY AS NEEDED Dx E11.9 03/13/19   Sonny Masters, FNP  budesonide-formoterol (SYMBICORT) 160-4.5 MCG/ACT inhaler Inhale 2 puffs into the lungs 2 (two) times daily. 10/05/17   Dettinger, Elige Radon, MD  cholecalciferol (VITAMIN D) 1000 UNITS tablet Take 2,000 Units by mouth daily.    [provider]  cloNIDine (CATAPRES) 0.1 MG tablet Take 1 tablet (0.1 mg total) by mouth at bedtime as needed. 05/19/19   Sonny Masters, FNP  doxycycline (VIBRAMYCIN) 100 MG capsule Take 1 capsule (100 mg total) by mouth 2 (two) times daily. 12/16/21   Sherian Maroon A,  PA  escitalopram (LEXAPRO) 20 MG tablet Take 1 tablet (20 mg total) by mouth daily. as directed 03/20/19   Dettinger, Elige Radon, MD  fluticasone Aleda Grana) 50 MCG/ACT nasal spray USE ONE SPRAY in each nostril ONCE daily 05/19/19   Rakes, Doralee Albino, FNP  gabapentin (NEURONTIN) 100 MG capsule Take 1 capsule (100 mg total) by mouth at bedtime. 02/27/20   Persons, [place], PA  glucose blood (ONETOUCH VERIO) test strip CHECK GLUCOSE TWICE DAILY AS NEEDED Dx E11.9 03/13/19   Sonny Masters, FNP  ketoconazole (NIZORAL) 2 % shampoo Apply 1 application topically 2 (two) times a week. 05/26/18   Bennie Pierini, FNP  Lancet Devices (ONE TOUCH DELICA LANCING DEV) MISC Use to check BG up to twice a day. DX: E11.29 - type 2 DM 09/02/16   Ernestina Penna, MD  Lancets [facility] ULTRASOFT) lancets CHECK GLUCOSE TWICE DAILY AS NEEDED Dx E11.9 03/13/19   Sonny Masters, FNP  lovastatin (MEVACOR) 40 MG tablet Take 1 tablet (40 mg total) by mouth daily. 03/20/19   Dettinger, Elige Radon, MD  Melatonin 5 MG CAPS Take 1 capsule (5 mg total) by mouth at bedtime. 03/20/19   Dettinger, Elige Radon, MD  metFORMIN (GLUCOPHAGE-XR) 500 MG 24 hr tablet 1 tablet once daily 03/20/19   Dettinger, Elige Radon, MD  Omega-3 Fatty Acids (FISH OIL) 1000 MG CAPS Take by mouth.    [provider]  predniSONE (DELTASONE)  10 MG tablet Take 1 tablet (10 mg total) by mouth daily with breakfast. 02/27/20   Persons, [place], PA  triamcinolone cream (KENALOG) 0.1 % Apply 1 application topically 2 (two) times daily. 02/20/19   Sonny Masters, FNP      Allergies    Lisinopril, Chlordiazepoxide-clidinium, Clarithromycin, Penicillins, Betadine [povidone iodine], Erythromycin, Evista [raloxifene hydrochloride], Hydrogen peroxide, Sertraline hcl, and Sulfa antibiotics    Review of Systems   Review of Systems  Physical Exam Updated Vital Signs BP (!) 135/53   Pulse 66   Temp 98.3 F (36.8 C)   Resp 15   SpO2 100%  Physical Exam  ED Results  / Procedures / Treatments   Labs (all labs ordered are listed, but only abnormal results are displayed) Labs Reviewed  CK  CBC WITH DIFFERENTIAL/PLATELET  COMPREHENSIVE METABOLIC PANEL  URINALYSIS, ROUTINE W REFLEX MICROSCOPIC    EKG EKG Interpretation Date/Time:  Monday January 11 2023 19:00:29 EDT Ventricular Rate:  68 PR Interval:  169 QRS Duration:  97 QT Interval:  445 QTC Calculation: 474 R Axis:   -53  Text Interpretation: No significant change since last tracing Sinus rhythm Left anterior fascicular block Anterior infarct, old Confirmed by Vanetta Mulders [phone]) on 01/11/2023 10:35:17 PM  Radiology No results found.  Procedures Procedures  {Document cardiac monitor, telemetry assessment procedure when appropriate:1}  Medications Ordered in ED Medications  0.9 %  sodium chloride infusion (has no administration in time range)    ED Course/ Medical Decision Making/ A&P   {   Click here for ABCD2, HEART and other calculatorsREFRESH Note before signing :1}                              Medical Decision Making Amount and/or Complexity of Data Reviewed Labs: ordered. Radiology: ordered.  Risk Prescription drug management.   ***  {Document critical care time when appropriate:1} {Document review of labs and clinical decision tools ie heart score, Chads2Vasc2 etc:1}  {Document your independent review of radiology images, and any outside records:1} {Document your discussion with family members, caretakers, and with consultants:1} {Document social determinants of health affecting pt's care:1} {Document your decision making why or why not admission, treatments were needed:1} Final Clinical Impression(s) / ED Diagnoses Final diagnoses:  None    Rx / DC Orders ED Discharge Orders     None

## 2023-01-12 ENCOUNTER — Inpatient Hospital Stay (HOSPITAL_COMMUNITY): Payer: Medicare Other

## 2023-01-12 ENCOUNTER — Other Ambulatory Visit: Payer: Self-pay

## 2023-01-12 ENCOUNTER — Emergency Department (HOSPITAL_COMMUNITY): Payer: Medicare Other

## 2023-01-12 DIAGNOSIS — T796XXA Traumatic ischemia of muscle, initial encounter: Secondary | ICD-10-CM | POA: Diagnosis not present

## 2023-01-12 DIAGNOSIS — S062X0A Diffuse traumatic brain injury without loss of consciousness, initial encounter: Secondary | ICD-10-CM | POA: Diagnosis present

## 2023-01-12 DIAGNOSIS — N390 Urinary tract infection, site not specified: Secondary | ICD-10-CM | POA: Diagnosis present

## 2023-01-12 DIAGNOSIS — I152 Hypertension secondary to endocrine disorders: Secondary | ICD-10-CM

## 2023-01-12 DIAGNOSIS — Y92009 Unspecified place in unspecified non-institutional (private) residence as the place of occurrence of the external cause: Secondary | ICD-10-CM | POA: Diagnosis not present

## 2023-01-12 DIAGNOSIS — Z7984 Long term (current) use of oral hypoglycemic drugs: Secondary | ICD-10-CM | POA: Diagnosis not present

## 2023-01-12 DIAGNOSIS — Z8249 Family history of ischemic heart disease and other diseases of the circulatory system: Secondary | ICD-10-CM | POA: Diagnosis not present

## 2023-01-12 DIAGNOSIS — S062XAA Diffuse traumatic brain injury with loss of consciousness status unknown, initial encounter: Secondary | ICD-10-CM

## 2023-01-12 DIAGNOSIS — F418 Other specified anxiety disorders: Secondary | ICD-10-CM

## 2023-01-12 DIAGNOSIS — E43 Unspecified severe protein-calorie malnutrition: Secondary | ICD-10-CM | POA: Diagnosis present

## 2023-01-12 DIAGNOSIS — E871 Hypo-osmolality and hyponatremia: Secondary | ICD-10-CM | POA: Diagnosis present

## 2023-01-12 DIAGNOSIS — M6282 Rhabdomyolysis: Secondary | ICD-10-CM | POA: Diagnosis present

## 2023-01-12 DIAGNOSIS — J439 Emphysema, unspecified: Secondary | ICD-10-CM | POA: Diagnosis present

## 2023-01-12 DIAGNOSIS — W19XXXA Unspecified fall, initial encounter: Secondary | ICD-10-CM | POA: Diagnosis not present

## 2023-01-12 DIAGNOSIS — M160 Bilateral primary osteoarthritis of hip: Secondary | ICD-10-CM | POA: Diagnosis present

## 2023-01-12 DIAGNOSIS — G9341 Metabolic encephalopathy: Secondary | ICD-10-CM | POA: Diagnosis present

## 2023-01-12 DIAGNOSIS — G8929 Other chronic pain: Secondary | ICD-10-CM | POA: Diagnosis present

## 2023-01-12 DIAGNOSIS — K5900 Constipation, unspecified: Secondary | ICD-10-CM | POA: Diagnosis present

## 2023-01-12 DIAGNOSIS — E1159 Type 2 diabetes mellitus with other circulatory complications: Secondary | ICD-10-CM

## 2023-01-12 DIAGNOSIS — J310 Chronic rhinitis: Secondary | ICD-10-CM | POA: Diagnosis present

## 2023-01-12 DIAGNOSIS — E1169 Type 2 diabetes mellitus with other specified complication: Secondary | ICD-10-CM | POA: Diagnosis present

## 2023-01-12 DIAGNOSIS — N3000 Acute cystitis without hematuria: Secondary | ICD-10-CM

## 2023-01-12 DIAGNOSIS — E119 Type 2 diabetes mellitus without complications: Secondary | ICD-10-CM

## 2023-01-12 DIAGNOSIS — E876 Hypokalemia: Secondary | ICD-10-CM | POA: Diagnosis present

## 2023-01-12 DIAGNOSIS — E785 Hyperlipidemia, unspecified: Secondary | ICD-10-CM | POA: Diagnosis present

## 2023-01-12 DIAGNOSIS — F419 Anxiety disorder, unspecified: Secondary | ICD-10-CM | POA: Diagnosis present

## 2023-01-12 DIAGNOSIS — Z8261 Family history of arthritis: Secondary | ICD-10-CM | POA: Diagnosis not present

## 2023-01-12 DIAGNOSIS — Z7982 Long term (current) use of aspirin: Secondary | ICD-10-CM | POA: Diagnosis not present

## 2023-01-12 DIAGNOSIS — W08XXXA Fall from other furniture, initial encounter: Secondary | ICD-10-CM | POA: Diagnosis present

## 2023-01-12 DIAGNOSIS — R7401 Elevation of levels of liver transaminase levels: Secondary | ICD-10-CM | POA: Insufficient documentation

## 2023-01-12 DIAGNOSIS — F32A Depression, unspecified: Secondary | ICD-10-CM | POA: Diagnosis present

## 2023-01-12 DIAGNOSIS — Z833 Family history of diabetes mellitus: Secondary | ICD-10-CM | POA: Diagnosis not present

## 2023-01-12 DIAGNOSIS — E860 Dehydration: Secondary | ICD-10-CM | POA: Diagnosis present

## 2023-01-12 DIAGNOSIS — Z825 Family history of asthma and other chronic lower respiratory diseases: Secondary | ICD-10-CM | POA: Diagnosis not present

## 2023-01-12 LAB — URINALYSIS, ROUTINE W REFLEX MICROSCOPIC
Bilirubin Urine: NEGATIVE
Glucose, UA: NEGATIVE mg/dL
Ketones, ur: 20 mg/dL — AB
Leukocytes,Ua: NEGATIVE
Nitrite: NEGATIVE
Protein, ur: 100 mg/dL — AB
Specific Gravity, Urine: 1.018 (ref 1.005–1.030)
pH: 6 (ref 5.0–8.0)

## 2023-01-12 LAB — CBC WITH DIFFERENTIAL/PLATELET
Abs Immature Granulocytes: 0.04 10*3/uL (ref 0.00–0.07)
Basophils Absolute: 0 10*3/uL (ref 0.0–0.1)
Basophils Relative: 0 %
Eosinophils Absolute: 0 10*3/uL (ref 0.0–0.5)
Eosinophils Relative: 0 %
HCT: 41.4 % (ref 36.0–46.0)
Hemoglobin: 13.5 g/dL (ref 12.0–15.0)
Immature Granulocytes: 0 %
Lymphocytes Relative: 13 %
Lymphs Abs: 1.4 10*3/uL (ref 0.7–4.0)
MCH: 31.5 pg (ref 26.0–34.0)
MCHC: 32.6 g/dL (ref 30.0–36.0)
MCV: 96.7 fL (ref 80.0–100.0)
Monocytes Absolute: 0.7 10*3/uL (ref 0.1–1.0)
Monocytes Relative: 6 %
Neutro Abs: 8.6 10*3/uL — ABNORMAL HIGH (ref 1.7–7.7)
Neutrophils Relative %: 81 %
Platelets: 246 10*3/uL (ref 150–400)
RBC: 4.28 MIL/uL (ref 3.87–5.11)
RDW: 13.2 % (ref 11.5–15.5)
WBC: 10.8 10*3/uL — ABNORMAL HIGH (ref 4.0–10.5)
nRBC: 0 % (ref 0.0–0.2)

## 2023-01-12 LAB — CK: Total CK: 4853 U/L — ABNORMAL HIGH (ref 38–234)

## 2023-01-12 LAB — PROCALCITONIN: Procalcitonin: <0.1 ng/mL

## 2023-01-12 LAB — COMPREHENSIVE METABOLIC PANEL
ALT: 41 U/L (ref 0–44)
ALT: 41 U/L (ref 0–44)
AST: 153 U/L — ABNORMAL HIGH (ref 15–41)
AST: 139 U/L — ABNORMAL HIGH (ref 15–41)
Albumin: 4.1 g/dL (ref 3.5–5.0)
Albumin: 3.6 g/dL (ref 3.5–5.0)
Alkaline Phosphatase: 52 U/L (ref 38–126)
Alkaline Phosphatase: 49 U/L (ref 38–126)
Anion gap: 10 (ref 5–15)
Anion gap: 9 (ref 5–15)
BUN: 25 mg/dL — ABNORMAL HIGH (ref 8–23)
BUN: 23 mg/dL (ref 8–23)
CO2: 25 mmol/L (ref 22–32)
CO2: 23 mmol/L (ref 22–32)
Calcium: 9.4 mg/dL (ref 8.9–10.3)
Calcium: 8.5 mg/dL — ABNORMAL LOW (ref 8.9–10.3)
Chloride: 96 mmol/L — ABNORMAL LOW (ref 98–111)
Chloride: 100 mmol/L (ref 98–111)
Creatinine, Ser: 0.79 mg/dL (ref 0.44–1.00)
Creatinine, Ser: 0.63 mg/dL (ref 0.44–1.00)
GFR, Estimated: >60 mL/min (ref >60)
GFR, Estimated: >60 mL/min (ref >60)
Glucose, Bld: 132 mg/dL — ABNORMAL HIGH (ref 70–99)
Glucose, Bld: 112 mg/dL — ABNORMAL HIGH (ref 70–99)
Potassium: 4.6 mmol/L (ref 3.5–5.1)
Potassium: 3.9 mmol/L (ref 3.5–5.1)
Sodium: 131 mmol/L — ABNORMAL LOW (ref 135–145)
Sodium: 132 mmol/L — ABNORMAL LOW (ref 135–145)
Total Bilirubin: 1.5 mg/dL — ABNORMAL HIGH (ref 0.3–1.2)
Total Bilirubin: 1 mg/dL (ref 0.3–1.2)
Total Protein: 7 g/dL (ref 6.5–8.1)
Total Protein: 6.2 g/dL — ABNORMAL LOW (ref 6.5–8.1)

## 2023-01-12 LAB — GLUCOSE, CAPILLARY
Glucose-Capillary: 111 mg/dL — ABNORMAL HIGH (ref 70–99)
Glucose-Capillary: 132 mg/dL — ABNORMAL HIGH (ref 70–99)
Glucose-Capillary: 121 mg/dL — ABNORMAL HIGH (ref 70–99)
Glucose-Capillary: 140 mg/dL — ABNORMAL HIGH (ref 70–99)

## 2023-01-12 LAB — AMMONIA: Ammonia: <10 umol/L (ref 9–35)

## 2023-01-12 LAB — TSH: TSH: 1.633 u[IU]/mL (ref 0.350–4.500)

## 2023-01-12 LAB — HEPATITIS PANEL, ACUTE
HCV Ab: NONREACTIVE
Hep A IgM: NONREACTIVE
Hep B C IgM: NONREACTIVE
Hepatitis B Surface Ag: NONREACTIVE

## 2023-01-12 LAB — HEMOGLOBIN A1C
Hgb A1c MFr Bld: 6.4 % — ABNORMAL HIGH (ref 4.8–5.6)
Mean Plasma Glucose: 136.98 mg/dL

## 2023-01-12 LAB — MAGNESIUM: Magnesium: 2 mg/dL (ref 1.7–2.4)

## 2023-01-12 MED ORDER — ACETAMINOPHEN 325 MG PO TABS: 650.0000 mg | ORAL_TABLET | Freq: Four times a day (QID) | ORAL | Status: DC | PRN

## 2023-01-12 MED ORDER — ASPIRIN 81 MG PO TBEC
81.0000 mg | DELAYED_RELEASE_TABLET | ORAL | Status: DC
  Administered 2023-01-12: 81 mg via ORAL
  Filled 2023-01-12: qty 1

## 2023-01-12 MED ORDER — PRAVASTATIN SODIUM 10 MG PO TABS
10.0000 mg | ORAL_TABLET | Freq: Every day | ORAL | Status: DC
  Administered 2023-01-12 – 2023-01-14 (×3): 10 mg via ORAL
  Filled 2023-01-12 (×3): qty 1

## 2023-01-12 MED ORDER — ALPRAZOLAM 0.25 MG PO TABS
0.2500 mg | ORAL_TABLET | Freq: Two times a day (BID) | ORAL | Status: DC | PRN
  Administered 2023-01-12 – 2023-01-14 (×5): 0.25 mg via ORAL
  Filled 2023-01-12 (×6): qty 1

## 2023-01-12 MED ORDER — SODIUM CHLORIDE 0.9 % IV BOLUS
500.0000 mL | Freq: Once | INTRAVENOUS | Status: AC
  Administered 2023-01-12: 500 mL via INTRAVENOUS

## 2023-01-12 MED ORDER — INSULIN ASPART 100 UNIT/ML IJ SOLN: 0.0000 [IU] | Freq: Every day | INTRAMUSCULAR | Status: DC

## 2023-01-12 MED ORDER — ACETAMINOPHEN 650 MG RE SUPP: 650.0000 mg | Freq: Four times a day (QID) | RECTAL | Status: DC | PRN

## 2023-01-12 MED ORDER — ONDANSETRON HCL 4 MG/2ML IJ SOLN: 4.0000 mg | Freq: Four times a day (QID) | INTRAMUSCULAR | Status: DC | PRN

## 2023-01-12 MED ORDER — ENSURE ENLIVE PO LIQD
237.0000 mL | Freq: Two times a day (BID) | ORAL | Status: DC
  Administered 2023-01-12 – 2023-01-15 (×6): 237 mL via ORAL

## 2023-01-12 MED ORDER — ONDANSETRON HCL 4 MG PO TABS: 4.0000 mg | ORAL_TABLET | Freq: Four times a day (QID) | ORAL | Status: DC | PRN

## 2023-01-12 MED ORDER — GABAPENTIN 100 MG PO CAPS
100.0000 mg | ORAL_CAPSULE | Freq: Every day | ORAL | Status: DC
  Administered 2023-01-12 – 2023-01-14 (×3): 100 mg via ORAL
  Filled 2023-01-12 (×3): qty 1

## 2023-01-12 MED ORDER — SODIUM CHLORIDE 0.9 % IV SOLN
1.0000 g | INTRAVENOUS | Status: DC
  Administered 2023-01-12 – 2023-01-14 (×3): 1 g via INTRAVENOUS
  Filled 2023-01-12 (×3): qty 10

## 2023-01-12 MED ORDER — SODIUM CHLORIDE 0.9 % IV BOLUS
1000.0000 mL | Freq: Once | INTRAVENOUS | Status: AC
  Administered 2023-01-12: 1000 mL via INTRAVENOUS

## 2023-01-12 MED ORDER — ESCITALOPRAM OXALATE 10 MG PO TABS
20.0000 mg | ORAL_TABLET | Freq: Every day | ORAL | Status: DC
  Administered 2023-01-13 – 2023-01-15 (×3): 20 mg via ORAL
  Filled 2023-01-12 (×3): qty 2

## 2023-01-12 MED ORDER — INSULIN ASPART 100 UNIT/ML IJ SOLN
0.0000 [IU] | Freq: Three times a day (TID) | INTRAMUSCULAR | Status: DC
  Administered 2023-01-13 (×2): 1 [IU] via SUBCUTANEOUS
  Administered 2023-01-14: 2 [IU] via SUBCUTANEOUS
  Administered 2023-01-14: 3 [IU] via SUBCUTANEOUS
  Administered 2023-01-14: 2 [IU] via SUBCUTANEOUS
  Administered 2023-01-15: 1 [IU] via SUBCUTANEOUS

## 2023-01-12 MED ORDER — ACETAMINOPHEN 325 MG PO TABS
650.0000 mg | ORAL_TABLET | ORAL | Status: DC | PRN
  Administered 2023-01-13 – 2023-01-14 (×2): 650 mg via ORAL
  Filled 2023-01-12 (×2): qty 2

## 2023-01-12 MED ORDER — OXYCODONE HCL 5 MG PO TABS
5.0000 mg | ORAL_TABLET | ORAL | Status: DC | PRN
  Administered 2023-01-13 – 2023-01-14 (×2): 5 mg via ORAL
  Filled 2023-01-12 (×2): qty 1

## 2023-01-12 MED ORDER — MOMETASONE FURO-FORMOTEROL FUM 200-5 MCG/ACT IN AERO
2.0000 | INHALATION_SPRAY | Freq: Two times a day (BID) | RESPIRATORY_TRACT | Status: DC
  Administered 2023-01-12 – 2023-01-15 (×6): 2 via RESPIRATORY_TRACT
  Filled 2023-01-12: qty 8.8

## 2023-01-12 MED ORDER — LORATADINE 10 MG PO TABS
10.0000 mg | ORAL_TABLET | Freq: Every day | ORAL | Status: DC
  Administered 2023-01-12 – 2023-01-15 (×4): 10 mg via ORAL
  Filled 2023-01-12 (×4): qty 1

## 2023-01-12 MED ORDER — AMLODIPINE BESYLATE 5 MG PO TABS
2.5000 mg | ORAL_TABLET | Freq: Every day | ORAL | Status: DC
  Administered 2023-01-12 – 2023-01-15 (×4): 2.5 mg via ORAL
  Filled 2023-01-12 (×4): qty 1

## 2023-01-12 NOTE — TOC Initial Note (Signed)
Transition of Care [facility]) - Initial/Assessment Note    Patient Details  Name: [name] [name] MRN: [id number] Date of Birth: [birth year]  Transition of Care [facility]) CM/SW Contact:    Leitha Bleak, RN Phone Number: 01/12/2023, 3:12 PM  Clinical Narrative:     Patient admitted with Rhabdomyolysis. Patient lives at home alone. PT is recommending SNF. She is agreeable for TOC to send out, requesting  [facility]. She state she hopes to get stronger and go home with HHPT.  CM also spoke with her daughter, [name]. She ie requesting [facility]. She stated she needs SNF. FL2 completed and sent out for bed offers to discuss with patient.               Expected Discharge Plan: Skilled Nursing Facility Barriers to Discharge: Continued Medical Work up  Patient Goals and CMS Choice Patient states their goals for this hospitalization and ongoing recovery are:: to get stronger and go home. CMS Medicare.gov Compare Post Acute Care list provided to:: Patient Choice offered to / list presented to : Patient      Expected Discharge Plan and Services     Living arrangements for the past 2 months: Single Family Home                    Prior Living Arrangements/Services Living arrangements for the past 2 months: Single Family Home Lives with:: Self           Activities of Daily Living   ADL Screening (condition at time of admission) Does the patient have a NEW difficulty with bathing/dressing/toileting/self-feeding that is expected to last >3 days?: Yes (Initiates electronic notice to provider for possible OT consult) Does the patient have a NEW difficulty with getting in/out of bed, walking, or climbing stairs that is expected to last >3 days?: Yes (Initiates electronic notice to provider for possible PT consult) Does the patient have a NEW difficulty with communication that is expected to last >3 days?: No Is the patient deaf or have difficulty hearing?: No Does the patient have difficulty  seeing, even when wearing glasses/contacts?: No Does the patient have difficulty concentrating, remembering, or making decisions?: No  Permission Sought/Granted    Emotional Assessment     Affect (typically observed): Accepting Orientation: : Oriented to Self, Oriented to Place, Oriented to Situation Alcohol / Substance Use: Not Applicable Psych Involvement: No (comment)  Admission diagnosis:  Rhabdomyolysis [M62.82] Fall, initial encounter [W19.XXXA] Non-traumatic rhabdomyolysis [M62.82] Contusion of cerebrum with loss of consciousness, unspecified laterality, initial encounter [facility]) [S06.339A] Patient Active Problem List   Diagnosis Date Noted   Rhabdomyolysis 01/12/2023   Brain contusion (HCC) 01/12/2023   Fall at home, initial encounter 01/12/2023   Transaminitis 01/12/2023   UTI (urinary tract infection) 01/12/2023   Hypertension associated with diabetes (HCC) 02/20/2019   Hyperlipidemia associated with type 2 diabetes mellitus (HCC) 02/20/2019   Depression, recurrent (HCC) 02/20/2019   Type 2 diabetes mellitus without complication, without long-term current use of insulin (HCC) 09/26/2015   COPD (chronic obstructive pulmonary disease) (HCC) 05/29/2015   Dermatographia 05/16/2015   Rectal bleeding 03/07/2014   History of total left hip arthroplasty 01/25/2014   Edema 01/02/2014   Hypoxia 01/02/2014   S/P total hip arthroplasty 12/27/2013   Anxiety    Depression with anxiety    Rhinitis    Atrophic vaginitis    Postmenopausal    PCP:  Joycelyn Rua, MD Pharmacy:   [facility] [place], [place] -  [address] [place] [address] Phone: [phone] Fax: [phone]     Social Determinants of Health (SDOH) Social History: SDOH Screenings   Food Insecurity: No Food Insecurity (01/12/2023)  Housing: Low Risk  (01/12/2023)  Transportation Needs: No Transportation Needs (01/12/2023)  Utilities: Not At Risk (01/12/2023)   Depression (PHQ2-9): Medium Risk (04/21/2019)  Financial Resource Strain: Low Risk  (12/08/2017)  Physical Activity: Inactive (12/12/2018)  Social Connections: Moderately Integrated (12/08/2017)  Stress: No Stress Concern Present (12/08/2017)  Tobacco Use: Low Risk  (01/06/2022)   Received from [facility]   SDOH Interventions:    Readmission Risk Interventions    01/12/2023    3:12 PM  Readmission Risk Prevention Plan  Post Dischage Appt Not Complete  Medication Screening Complete  Transportation Screening Complete

## 2023-01-12 NOTE — Assessment & Plan Note (Signed)
Continue statin. 

## 2023-01-12 NOTE — Assessment & Plan Note (Signed)
Continue Norvasc

## 2023-01-12 NOTE — Assessment & Plan Note (Signed)
-   UA indicative of UTI - Leukocytosis at 13.8 - Altered mental status - Start Rocephin - Urine culture pending - Continue to monitor

## 2023-01-12 NOTE — ED Notes (Signed)
Patient transported to CT 

## 2023-01-12 NOTE — Assessment & Plan Note (Signed)
-   CPK is greater than 4000 - Patient was found down for an unknown amount of time - Continue fluids - Trend in the a.m.

## 2023-01-12 NOTE — Assessment & Plan Note (Signed)
-   Unknown mechanism - Found down after an unknown time interval - CT C-spine showed no acute findings - CT head shows 5 mm left frontal lobe subcortical petechial bleed - X-ray left ankle has no acute findings - X-ray hip shows osteoarthritis without acute findings - X-ray knee shows an abnormal tibial plateau - CT knee shows an old fracture of the tibial plateau but nothing acute - Chest x-ray shows no active disease - EKG shows a heart rate of 68, QTc 474 - Subsequently has rhabdomyolysis - Continue fluids - PT eval and treat

## 2023-01-12 NOTE — Assessment & Plan Note (Signed)
Continue Xanax and Lexapro

## 2023-01-12 NOTE — Plan of Care (Signed)
  Problem: Education: Goal: Ability to describe self-care measures that may prevent or decrease complications (Diabetes Survival Skills Education) will improve Outcome: Progressing Goal: Individualized Educational Video(s) Outcome: Progressing   Problem: Coping: Goal: Ability to adjust to condition or change in health will improve Outcome: Progressing   

## 2023-01-12 NOTE — Assessment & Plan Note (Signed)
-   AST elevated at 153 - Previously normal - No tenderness over palpation of liver - Likely related to rhabdo/dehydration - Continue fluids and recheck in the a.m. - Hepatitis panel for completeness

## 2023-01-12 NOTE — H&P (Signed)
History and Physical    Patient: [name] [name] ZOX:[id number] DOB: 05/29/1931 DOA: 01/11/2023 DOS: the patient was seen and examined on 01/12/2023 PCP: Joycelyn Rua, MD  Patient coming from: Home  Chief Complaint:  Chief Complaint  Patient presents with   Fall   HPI: [name] [name] is a 89 y.o. female with medical history significant of anxiety, depression, COPD, diabetes mellitus type 2, hypertension, hyperlipidemia, rhinitis, and more presents the ED with a chief complaint of being found down.  Unfortunately patient does not recall the event at all.  She tells me she does not know why she is here.  She reports that at the time of my exam she is having no pain.  She is not short of breath.  She has no nausea.  She reports she does have some nasal congestion that is chronic.  Looks like she was recently put on Doxy for it.  She reports that her PCP put her on antibiotic once, and then when she asked for it again he did not want to do it again.  She reports she has been using nasal spray for this at home.  Patient is alert and oriented to person and place, but not to time.  She has no other complaints at this time.  Patient does not drink and does not smoke.  CODE STATUS is not addressed with this patient and she is made full code by default because she is not at her baseline mentation at this time. Review of Systems: unable to review all systems due to the inability of the patient to answer questions. Past Medical History:  Diagnosis Date   Anxiety    Arthritis    Atrophic vaginitis    Cataract    COPD (chronic obstructive pulmonary disease) (HCC)    Depressive disorder, not elsewhere classified    Diabetes mellitus without complication (HCC)    Disorder of bone and cartilage, unspecified    Elevated blood sugar    Emphysema lung (HCC)    Hypertension    Other and unspecified hyperlipidemia    Postmenopausal    Rhinitis    Past Surgical History:  Procedure Laterality Date    APPENDECTOMY  1957   CATARACT EXTRACTION W/ INTRAOCULAR LENS IMPLANT Bilateral 2005   [place]   COLONOSCOPY N/A 03/08/2014   Procedure: COLONOSCOPY;  Surgeon: Malissa Hippo, MD;  Location: [facility] SUITE;  Service: Endoscopy;  Laterality: N/A;   EYE SURGERY     JOINT REPLACEMENT     TOTAL HIP ARTHROPLASTY Left 12/27/2013   Procedure: LEFT TOTAL HIP ARTHROPLASTY;  Surgeon: Nadara Mustard, MD;  Location: [facility] OR;  Service: Orthopedics;  Laterality: Left;   Social History:  reports that she has never smoked. She has never used smokeless tobacco. She reports that she does not drink alcohol and does not use drugs.  Allergies  Allergen Reactions   Lisinopril Cough   Chlordiazepoxide-Clidinium     Can't remember   Clarithromycin     Can't remember   Penicillins     Can't remember   Betadine [Povidone Iodine] Rash   Erythromycin Itching    Can't remember Can't remember   Evista [Raloxifene Hydrochloride]     Numb feeling   Hydrogen Peroxide Rash   Sertraline Hcl Other (See Comments)    Sweating and mouth irritation    Sulfa Antibiotics Nausea And Vomiting    Family History  Problem Relation Age of Onset   Arthritis Mother    COPD Father  Diabetes Father    Aneurysm Sister    Diabetes Sister    Diabetes Sister    Hypertension Sister    COPD Daughter    Fibromyalgia Daughter    Healthy Daughter     Prior to Admission medications   Medication Sig Start Date End Date Taking? Authorizing Provider  albuterol (PROVENTIL) (2.5 MG/3ML) 0.083% nebulizer solution NEBULIZE 1 VIAL EVERY 6 HOURS AS NEEDED FOR SHORTNESS OF BREATH OR WHEEZING Patient taking differently: Take 2.5 mg by nebulization every 6 (six) hours as needed for wheezing or shortness of breath.  09/19/18   Daphine Deutscher, Mary-Margaret, FNP  ALPRAZolam Prudy Feeler) 0.25 MG tablet Take 1 tablet (0.25 mg total) by mouth 2 (two) times daily as needed for anxiety. 04/28/19   Bethann Berkshire, MD  amLODipine (NORVASC) 2.5 MG tablet Take 1  tablet (2.5 mg total) by mouth daily. 03/20/19   Dettinger, Elige Radon, MD  aspirin 81 MG tablet Take 81 mg by mouth every other day.    [provider]  bacitracin ointment Apply 1 Application topically 2 (two) times daily. 12/16/21   Peter Garter, PA  blood glucose meter kit and supplies KIT Check BS once daily 12/12/15   Ernestina Penna, MD  Blood Glucose Monitoring Suppl [facility] VERIO FLEX SYSTEM) w/Device KIT CHECK GLUCOSE TWICE DAILY AS NEEDED Dx E11.9 03/13/19   Sonny Masters, FNP  budesonide-formoterol (SYMBICORT) 160-4.5 MCG/ACT inhaler Inhale 2 puffs into the lungs 2 (two) times daily. 10/05/17   Dettinger, Elige Radon, MD  cholecalciferol (VITAMIN D) 1000 UNITS tablet Take 2,000 Units by mouth daily.    [provider]  cloNIDine (CATAPRES) 0.1 MG tablet Take 1 tablet (0.1 mg total) by mouth at bedtime as needed. 05/19/19   Sonny Masters, FNP  doxycycline (VIBRAMYCIN) 100 MG capsule Take 1 capsule (100 mg total) by mouth 2 (two) times daily. 12/16/21   Sherian Maroon A, PA  escitalopram (LEXAPRO) 20 MG tablet Take 1 tablet (20 mg total) by mouth daily. as directed 03/20/19   Dettinger, Elige Radon, MD  fluticasone Aleda Grana) 50 MCG/ACT nasal spray USE ONE SPRAY in each nostril ONCE daily 05/19/19   Rakes, Doralee Albino, FNP  gabapentin (NEURONTIN) 100 MG capsule Take 1 capsule (100 mg total) by mouth at bedtime. 02/27/20   Persons, [place], PA  glucose blood (ONETOUCH VERIO) test strip CHECK GLUCOSE TWICE DAILY AS NEEDED Dx E11.9 03/13/19   Sonny Masters, FNP  ketoconazole (NIZORAL) 2 % shampoo Apply 1 application topically 2 (two) times a week. 05/26/18   Bennie Pierini, FNP  Lancet Devices (ONE TOUCH DELICA LANCING DEV) MISC Use to check BG up to twice a day. DX: E11.29 - type 2 DM 09/02/16   Ernestina Penna, MD  Lancets [facility] ULTRASOFT) lancets CHECK GLUCOSE TWICE DAILY AS NEEDED Dx E11.9 03/13/19   Sonny Masters, FNP  lovastatin (MEVACOR) 40 MG tablet Take 1 tablet (40 mg  total) by mouth daily. 03/20/19   Dettinger, Elige Radon, MD  Melatonin 5 MG CAPS Take 1 capsule (5 mg total) by mouth at bedtime. 03/20/19   Dettinger, Elige Radon, MD  metFORMIN (GLUCOPHAGE-XR) 500 MG 24 hr tablet 1 tablet once daily 03/20/19   Dettinger, Elige Radon, MD  Omega-3 Fatty Acids (FISH OIL) 1000 MG CAPS Take by mouth.    [provider]  predniSONE (DELTASONE) 10 MG tablet Take 1 tablet (10 mg total) by mouth daily with breakfast. 02/27/20   Persons, [place], [place]  triamcinolone cream (KENALOG) 0.1 % Apply 1 application topically 2 (two) times daily. 02/20/19   Sonny Masters, FNP    Physical Exam: Vitals:   01/11/23 2200 01/11/23 2300 01/12/23 0119 01/12/23 0330  BP: (!) 135/53 (!) 137/48 (!) 140/48 (!) 147/58  Pulse: 66 64 70 66  Resp: 15  19 12   Temp:      SpO2: 100% 94% 96% 98%   1.  General: Patient lying supine in bed,  no acute distress   2. Psychiatric: Alert and oriented x person and place, mood and behavior normal for situation, pleasant and cooperative with exam   3. Neurologic: Speech and language are normal, face is symmetric, moves all 4 extremities voluntarily, at baseline without acute deficits on limited exam   4. HEENMT:  Head is atraumatic, normocephalic, pupils reactive to light, neck is supple, trachea is midline, mucous membranes are moist   5. Respiratory : Lungs are clear to auscultation bilaterally without wheezing, rhonchi, rales, no cyanosis, no increase in work of breathing or accessory muscle use   6. Cardiovascular : Heart rate normal, rhythm is regular, no murmurs, rubs or gallops, unilateral left peripheral edema, peripheral pulses palpated   7. Gastrointestinal:  Abdomen is soft, nondistended, nontender to palpation bowel sounds active, no masses or organomegaly palpated   8. Skin:  Skin is warm, dry and intact without rashes, acute lesions, or ulcers on limited exam   9.Musculoskeletal:  No acute deformities or trauma, no asymmetry  in tone, unilateral left peripheral edema, peripheral pulses palpated, no tenderness to palpation in the extremities  Data Reviewed: In the ED Temp 98.3, heart rate 64-70, respiratory rate 15-19, blood pressure 124/48-140/59, satting 94-100% Leukocytosis at 13.8, hemoglobin 14.1 Slight hyponatremia 131 AST 853 CPK greater than 4100 CT C-spine shows no acute fracture CT head shows a 5 mm left frontal lobe subcortical petechial bleed X-ray left ankle no acute findings X-ray hips osteoarthritis without acute findings X-ray knee abnormality of the tibial plateau CT knee abnormality of the tibial plateau without acute fracture Chest x-ray shows no active disease EKG shows a heart rate of 68, sinus rhythm, QTc 4 and 74 1 L bolus given in the ED followed by 500 mL bolus Continue IV fluids Continue to monitor  Assessment and Plan: * Rhabdomyolysis - CPK is greater than 4000 - Patient was found down for an unknown amount of time - Continue fluids - Trend in the a.m.  UTI (urinary tract infection) - UA indicative of UTI - Leukocytosis at 13.8 - Altered mental status - Start Rocephin - Urine culture pending - Continue to monitor  Transaminitis - AST elevated at 153 - Previously normal - No tenderness over palpation of liver - Likely related to rhabdo/dehydration - Continue fluids and recheck in the a.m. - Hepatitis panel for completeness  Fall at home, initial encounter - Unknown mechanism - Found down after an unknown time interval - CT C-spine showed no acute findings - CT head shows 5 mm left frontal lobe subcortical petechial bleed - X-ray left ankle has no acute findings - X-ray hip shows osteoarthritis without acute findings - X-ray knee shows an abnormal tibial plateau - CT knee shows an old fracture of the tibial plateau but nothing acute - Chest x-ray shows no active disease - EKG shows a heart rate of 68, QTc 474 - Subsequently has rhabdomyolysis - Continue  fluids - PT eval and treat  Brain contusion (HCC) - CT head shows a 5 mm left  frontal lobe subcortical petechial bleed - Neurosurgery consulted and recommended nothing further to do, not even repeat imaging - Neurochecks - Holding pharmaceutical DVT prophylaxis - Continue to monitor  Hyperlipidemia associated with type 2 diabetes mellitus (HCC) - Continue statin  Hypertension associated with diabetes (HCC) - Continue Norvasc  Type 2 diabetes mellitus without complication, without long-term current use of insulin (HCC) - Monitor CBG - Sensitive sliding scale coverage - Hemoglobin A1c in the a.m.  Depression with anxiety - Continue Xanax and Lexapro      Advance Care Planning:   Code Status: Full Code  Consults: None at this time  Family Communication: No family at bedside  Severity of Illness: The appropriate patient status for this patient is INPATIENT. Inpatient status is judged to be reasonable and necessary in order to provide the required intensity of service to ensure the patient's safety. The patient's presenting symptoms, physical exam findings, and initial radiographic and laboratory data in the context of their chronic comorbidities is felt to place them at high risk for further clinical deterioration. Furthermore, it is not anticipated that the patient will be medically stable for discharge from the hospital within 2 midnights of admission.   * I certify that at the point of admission it is my clinical judgment that the patient will require inpatient hospital care spanning beyond 2 midnights from the point of admission due to high intensity of service, high risk for further deterioration and high frequency of surveillance required.*  Author: Lilyan Gilford, DO 01/12/2023 5:09 AM  For on call review [email or web address].

## 2023-01-12 NOTE — Plan of Care (Signed)
  Problem: Acute Rehab PT Goals(only PT should resolve) Goal: Pt Will Go Supine/Side To Sit Outcome: Progressing Flowsheets (Taken 01/12/2023 1401) Pt will go Supine/Side to Sit:  with minimal assist  with contact guard assist Goal: Patient Will Transfer Sit To/From Stand Outcome: Progressing Flowsheets (Taken 01/12/2023 1401) Patient will transfer sit to/from stand:  with contact guard assist  with minimal assist Goal: Pt Will Transfer Bed To Chair/Chair To Bed Outcome: Progressing Flowsheets (Taken 01/12/2023 1401) Pt will Transfer Bed to Chair/Chair to Bed: with min assist Goal: Pt Will Ambulate Outcome: Progressing Flowsheets (Taken 01/12/2023 1401) Pt will Ambulate:  25 feet  with minimal assist  with moderate assist  with rolling walker   2:01 PM, 01/12/23 Ocie Bob, MPT Physical Therapist with [facility] [phone] office [phone] mobile phone

## 2023-01-12 NOTE — [place] FL2 (Signed)
[place] MEDICAID FL2 LEVEL OF CARE FORM     IDENTIFICATION  Patient Name: [name] [name] Birthdate: 1931-09-26 Sex: female Admission Date (Current Location): 01/11/2023  [facility] and [place] Number:  [employer] and Address:  [facility],  [address] [place] [address]      Provider Number: [id number]  Attending Physician Name and Address:  Lewie Chamber, MD  Relative Name and Phone Number:  Burnett Harry (Daughter)  [phone]    Current Level of Care: Hospital Recommended Level of Care: Skilled Nursing Facility Prior Approval Number:    Date Approved/Denied:   PASRR Number: [id number]  Discharge Plan: SNF    Current Diagnoses: Patient Active Problem List   Diagnosis Date Noted   Rhabdomyolysis 01/12/2023   Brain contusion (HCC) 01/12/2023   Fall at home, initial encounter 01/12/2023   Transaminitis 01/12/2023   UTI (urinary tract infection) 01/12/2023   Hypertension associated with diabetes (HCC) 02/20/2019   Hyperlipidemia associated with type 2 diabetes mellitus (HCC) 02/20/2019   Depression, recurrent (HCC) 02/20/2019   Type 2 diabetes mellitus without complication, without long-term current use of insulin (HCC) 09/26/2015   COPD (chronic obstructive pulmonary disease) (HCC) 05/29/2015   Dermatographia 05/16/2015   Rectal bleeding 03/07/2014   History of total left hip arthroplasty 01/25/2014   Edema 01/02/2014   Hypoxia 01/02/2014   S/P total hip arthroplasty 12/27/2013   Anxiety    Depression with anxiety    Rhinitis    Atrophic vaginitis    Postmenopausal     Orientation RESPIRATION BLADDER Height & Weight     Self, Situation, Place  Normal Continent Weight:   Height:     BEHAVIORAL SYMPTOMS/MOOD NEUROLOGICAL BOWEL NUTRITION STATUS      Continent Diet (See DC Summary)  AMBULATORY STATUS COMMUNICATION OF NEEDS Skin   Extensive Assist Verbally Normal                       Personal Care Assistance Level of  Assistance  Bathing, Feeding, Dressing Bathing Assistance: Maximum assistance Feeding assistance: Limited assistance Dressing Assistance: Maximum assistance     Functional Limitations Info  Sight, Hearing, Speech Sight Info: Impaired Hearing Info: Impaired Speech Info: Adequate    SPECIAL CARE FACTORS FREQUENCY  PT (By licensed PT)     PT Frequency: 5 TImes a week              Contractures Contractures Info: Not present    Additional Factors Info  Code Status, Allergies Code Status Info: FULL Allergies Info: Lisinopril  Chlordiazepoxide-clidinium  Clarithromycin  Penicillins  Betadine (Povidone Iodine)  Erythromycin  Evista (Raloxifene Hydrochloride)  Hydrogen Peroxide  Sertraline Hcl  Sulfa Antibiotics           Current Medications (01/12/2023):  This is the current hospital active medication list Current Facility-Administered Medications  Medication Dose Route Frequency Provider Last Rate Last Admin   0.9 %  sodium chloride infusion   Intravenous Continuous Zierle-Ghosh, Asia B, DO 150 mL/hr at 01/12/23 0937 New Bag at 01/12/23 8469   acetaminophen (TYLENOL) tablet 650 mg  650 mg Oral Q4H PRN Lewie Chamber, MD       Or   acetaminophen (TYLENOL) suppository 650 mg  650 mg Rectal Q6H PRN Lewie Chamber, MD       ALPRAZolam Prudy Feeler) tablet 0.25 mg  0.25 mg Oral BID PRN Zierle-Ghosh, Asia B, DO   0.25 mg at 01/12/23 1008   amLODipine (NORVASC) tablet 2.5 mg  2.5 mg Oral Daily Zierle-Ghosh, Asia B, DO   2.5 mg at 01/12/23 1610   aspirin EC tablet 81 mg  81 mg Oral QODAY Zierle-Ghosh, Asia B, DO   81 mg at 01/12/23 0954   cefTRIAXone (ROCEPHIN) 1 g in sodium chloride 0.9 % 100 mL IVPB  1 g Intravenous Q24H Zierle-Ghosh, Asia B, DO   Stopped at 01/12/23 9604   escitalopram (LEXAPRO) tablet 20 mg  20 mg Oral Daily Zierle-Ghosh, Asia B, DO       feeding supplement (ENSURE ENLIVE / ENSURE PLUS) liquid 237 mL  237 mL Oral BID BM Lewie Chamber, MD       gabapentin (NEURONTIN)  capsule 100 mg  100 mg Oral QHS Zierle-Ghosh, Asia B, DO       insulin aspart (novoLOG) injection 0-5 Units  0-5 Units Subcutaneous QHS Zierle-Ghosh, Asia B, DO       insulin aspart (novoLOG) injection 0-9 Units  0-9 Units Subcutaneous TID WC Zierle-Ghosh, Asia B, DO       loratadine (CLARITIN) tablet 10 mg  10 mg Oral Daily Zierle-Ghosh, Asia B, DO   10 mg at 01/12/23 0954   mometasone-formoterol (DULERA) 200-5 MCG/ACT inhaler 2 puff  2 puff Inhalation BID Zierle-Ghosh, Asia B, DO       ondansetron (ZOFRAN) tablet 4 mg  4 mg Oral Q6H PRN Zierle-Ghosh, Asia B, DO       Or   ondansetron (ZOFRAN) injection 4 mg  4 mg Intravenous Q6H PRN Zierle-Ghosh, Asia B, DO       oxyCODONE (Oxy IR/ROXICODONE) immediate release tablet 5 mg  5 mg Oral Q4H PRN Zierle-Ghosh, Asia B, DO       pravastatin (PRAVACHOL) tablet 10 mg  10 mg Oral q1800 Zierle-Ghosh, Asia B, DO         Discharge Medications: Please see discharge summary for a list of discharge medications.  Relevant Imaging Results:  Relevant Lab Results:   Additional Information SS# [id number]  Leitha Bleak, RN

## 2023-01-12 NOTE — Progress Notes (Signed)
   01/12/23 0934  Vitals  Temp 98.1 F (36.7 C)  BP (!) 164/65  MAP (mmHg) 92  BP Location Right Arm  Pulse Rate 78  Resp 20  MEWS COLOR  MEWS Score Color Green  Oxygen Therapy  SpO2 98 %  O2 Device Room Air  MEWS Score  MEWS Temp 0  MEWS Systolic 0  MEWS Pulse 0  MEWS RR 0  MEWS LOC 0  MEWS Score 0

## 2023-01-12 NOTE — Evaluation (Signed)
Physical Therapy Evaluation Patient Details Name: [name] [name] MRN: [id number] DOB: [birth year] Today's Date: 01/12/2023  History of Present Illness  [name] [name] is a 89 y.o. female with medical history significant of anxiety, depression, COPD, diabetes mellitus type 2, hypertension, hyperlipidemia, rhinitis, and more presents the ED with a chief complaint of being found down.  Unfortunately patient does not recall the event at all.  She tells me she does not know why she is here.  She reports that at the time of my exam she is having no pain.  She is not short of breath.  She has no nausea.  She reports she does have some nasal congestion that is chronic.  Looks like she was recently put on Doxy for it.  She reports that her PCP put her on antibiotic once, and then when she asked for it again he did not want to do it again.  She reports she has been using nasal spray for this at home.  Patient is alert and oriented to person and place, but not to time.  She has no other complaints at this time.   Clinical Impression  Patient demonstrates slow labored movement for sitting up at bedside, has difficulty completing sit to stands due to weakness, limited to a few steps at bedside before having to sit due to fall risk and c/o fatigue.  Patient tolerated sitting up in chair after therapy - nursing staff notified.  Patient will benefit from continued skilled physical therapy in hospital and recommended venue below to increase strength, balance, endurance for safe ADLs and gait.           If plan is discharge home, recommend the following: A lot of help with bathing/dressing/bathroom;A lot of help with walking and/or transfers;Help with stairs or ramp for entrance;Assistance with cooking/housework   Can travel by private vehicle   Yes    Equipment Recommendations None recommended by PT  Recommendations for Other Services       Functional Status Assessment Patient has had a recent decline in  their functional status and demonstrates the ability to make significant improvements in function in a reasonable and predictable amount of time.     Precautions / Restrictions Precautions Precautions: Fall Restrictions Weight Bearing Restrictions: No      Mobility  Bed Mobility Overal bed mobility: Needs Assistance Bed Mobility: Supine to Sit     Supine to sit: Min assist, Mod assist     General bed mobility comments: increased time, labored movement    Transfers Overall transfer level: Needs assistance Equipment used: Rolling walker (2 wheels) Transfers: Sit to/from Stand, Bed to chair/wheelchair/BSC Sit to Stand: Mod assist   Step pivot transfers: Mod assist       General transfer comment: increased time due to weakness    Ambulation/Gait Ambulation/Gait assistance: Mod assist Gait Distance (Feet): 5 Feet Assistive device: Rolling walker (2 wheels) Gait Pattern/deviations: Decreased step length - right, Decreased step length - left, Decreased stride length, Knees buckling Gait velocity: slow     General Gait Details: limited to a few steps at bedside before having to sit due to BLE weakness, fall risk  Stairs            Wheelchair Mobility     Tilt Bed    Modified Rankin (Stroke Patients Only)       Balance Overall balance assessment: Needs assistance Sitting-balance support: Feet supported, No upper extremity supported Sitting balance-Leahy Scale: Fair Sitting balance - Comments: seated  at EOB   Standing balance support: Reliant on assistive device for balance, During functional activity, Bilateral upper extremity supported Standing balance-Leahy Scale: Poor Standing balance comment: using RW                             Pertinent Vitals/Pain Pain Assessment Pain Assessment: 0-10 Pain Score: 2  Pain Location: BLE Pain Descriptors / Indicators: Sore    Home Living Family/patient expects to be discharged to:: Private  residence Living Arrangements: Alone Available Help at Discharge: Family;Available PRN/intermittently Type of Home: House Home Access: Stairs to enter Entrance Stairs-Rails: Right;Left;Can reach both Entrance Stairs-Number of Steps: 2-3   Home Layout: One level Home Equipment: Agricultural consultant (2 wheels);Shower seat;Grab bars - tub/shower      Prior Function Prior Level of Function : Independent/Modified Independent             Mobility Comments: household and short distanced 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, does not drive ADLs Comments: Assisted by family     Extremity/Trunk Assessment   Upper Extremity Assessment Upper Extremity Assessment: Generalized weakness    Lower Extremity Assessment Lower Extremity Assessment: Generalized weakness    Cervical / Trunk Assessment Cervical / Trunk Assessment: Normal  Communication   Communication Communication: Hearing impairment  Cognition Arousal: Alert Behavior During Therapy: WFL for tasks assessed/performed Overall Cognitive Status: Within Functional Limits for tasks assessed                                          General Comments      Exercises     Assessment/Plan    PT Assessment Patient needs continued PT services  PT Problem List Decreased strength;Decreased activity tolerance;Decreased balance;Decreased mobility       PT Treatment Interventions DME instruction;Gait training;Stair training;Functional mobility training;Therapeutic activities;Therapeutic exercise;Balance training;Patient/family education    PT Goals (Current goals can be found in the Care Plan section)  Acute Rehab PT Goals Patient Stated Goal: return home after rehab PT Goal Formulation: With patient Time For Goal Achievement: 01/26/23 Potential to Achieve Goals: Good    Frequency Min 3X/week     Co-evaluation               AM-PAC PT "6 Clicks" Mobility  Outcome Measure Help needed turning from your back to  your side while in a flat bed without using bedrails?: A Lot Help needed moving from lying on your back to sitting on the side of a flat bed without using bedrails?: A Lot Help needed moving to and from a bed to a chair (including a wheelchair)?: A Lot Help needed standing up from a chair using your arms (e.g., wheelchair or bedside chair)?: A Lot Help needed to walk in hospital room?: A Lot Help needed climbing 3-5 steps with a railing? : A Lot 6 Click Score: 12    End of Session   Activity Tolerance: Patient tolerated treatment well;Patient limited by fatigue Patient left: in chair;with call bell/phone within reach;with chair alarm set Nurse Communication: Mobility status PT Visit Diagnosis: Unsteadiness on feet (R26.81);Other abnormalities of gait and mobility (R26.89);Muscle weakness (generalized) (M62.81)    Time: 2956-2130 PT Time Calculation (min) (ACUTE ONLY): 31 min   Charges:   PT Evaluation $PT Eval Moderate Complexity: 1 Mod PT Treatments $Therapeutic Activity: 23-37 mins PT General Charges $$ ACUTE PT VISIT: 1  Visit         2:00 PM, 01/12/23 Ocie Bob, MPT Physical Therapist with [facility] [phone] office [phone] mobile phone

## 2023-01-12 NOTE — Assessment & Plan Note (Signed)
-   Monitor CBG - Sensitive sliding scale coverage - Hemoglobin A1c in the a.m.

## 2023-01-12 NOTE — Plan of Care (Signed)
Patient seen and rounded on this morning after arriving to her room from ER.  89 year old female with PMH COPD, HTN, HLD, anxiety, emphysema who presented after reportedly falling out of her bed.  She states she felt like she was on the edge of her bed and fell out during the night and knocking over her nightstand.  She eventually crawled on the floor and was able to phone for help. On workup she was found to have rhabdomyolysis.  She was started on fluids and admitted for further workup. Because of confusion on admission she was also diagnosed with UTI given UA showing bacteria and 21-50 WBC.  Rocephin initiated and urine culture sent.  CT head showed 5 mm left frontal subcortical petechial bleed.  Findings discussed with neurosurgery with no recommendations for further workup nor monitoring. Left knee imaging showed effusion.  Plan: -No further head imaging unless does develop neurologic changes -Continue supportive care for left knee.  If were to further worsen in size or become more symptomatic, could consider arthrocentesis -Continue PT/OT evals - Continue UTI treatment and follow-up urine culture - Continue fluids and trend CK  Lewie Chamber, MD [facility] 01/12/2023, 1:13 PM

## 2023-01-12 NOTE — ED Provider Notes (Signed)
I took over patient's care at signout.  Plan was to follow-up on labs and imaging.  Labs consistent with acute rhabdomyolysis, likely due to her lying on the floor overnight. I reviewed CT imaging with radiology and she has a small cerebral contusion Patient does not appear to be on anticoagulation, this was confirmed with family Discussed with on-call neurosurgery APP Given the patient is awake alert and no neurologic deterioration and not on anticoagulation, she does not need further neurosurgical evaluation or CT imaging. Patient will need further neuroimaging if her mental status changes  Discussed with Dr. Dorthula Perfect for admission IV fluids been ordered for rhabdomyolysis  .Critical Care  Performed by: Zadie Rhine, MD Authorized by: Zadie Rhine, MD   Critical care provider statement:    Critical care time (minutes):  35   Critical care start time:  01/12/2023 1:30 AM   Critical care end time:  01/12/2023 2:05 AM   Critical care time was exclusive of:  Separately billable procedures and treating other patients   Critical care was necessary to treat or prevent imminent or life-threatening deterioration of the following conditions:  Shock, renal failure and trauma   Critical care was time spent personally by me on the following activities:  Examination of patient, ordering and review of radiographic studies, ordering and review of laboratory studies, ordering and performing treatments and interventions and obtaining history from patient or surrogate   I assumed direction of critical care for this patient from another provider in my specialty: no     Care discussed with: admitting provider       Zadie Rhine, MD 01/12/23 [phone]

## 2023-01-12 NOTE — Assessment & Plan Note (Signed)
-   CT head shows a 5 mm left frontal lobe subcortical petechial bleed - Neurosurgery consulted and recommended nothing further to do, not even repeat imaging - Neurochecks - Holding pharmaceutical DVT prophylaxis - Continue to monitor

## 2023-01-13 DIAGNOSIS — T796XXA Traumatic ischemia of muscle, initial encounter: Secondary | ICD-10-CM | POA: Diagnosis not present

## 2023-01-13 LAB — GLUCOSE, CAPILLARY
Glucose-Capillary: 104 mg/dL — ABNORMAL HIGH (ref 70–99)
Glucose-Capillary: 132 mg/dL — ABNORMAL HIGH (ref 70–99)
Glucose-Capillary: 141 mg/dL — ABNORMAL HIGH (ref 70–99)
Glucose-Capillary: 114 mg/dL — ABNORMAL HIGH (ref 70–99)

## 2023-01-13 LAB — BASIC METABOLIC PANEL
Anion gap: 7 (ref 5–15)
BUN: 13 mg/dL (ref 8–23)
CO2: 24 mmol/L (ref 22–32)
Calcium: 7.8 mg/dL — ABNORMAL LOW (ref 8.9–10.3)
Chloride: 102 mmol/L (ref 98–111)
Creatinine, Ser: 0.45 mg/dL (ref 0.44–1.00)
GFR, Estimated: >60 mL/min (ref >60)
Glucose, Bld: 109 mg/dL — ABNORMAL HIGH (ref 70–99)
Potassium: 3 mmol/L — ABNORMAL LOW (ref 3.5–5.1)
Sodium: 133 mmol/L — ABNORMAL LOW (ref 135–145)

## 2023-01-13 LAB — MAGNESIUM: Magnesium: 1.7 mg/dL (ref 1.7–2.4)

## 2023-01-13 LAB — TROPONIN I (HIGH SENSITIVITY)
Troponin I (High Sensitivity): 34 ng/L — ABNORMAL HIGH (ref <18)
Troponin I (High Sensitivity): 28 ng/L — ABNORMAL HIGH (ref <18)

## 2023-01-13 LAB — CBC WITH DIFFERENTIAL/PLATELET
Abs Immature Granulocytes: 0.03 10*3/uL (ref 0.00–0.07)
Basophils Absolute: 0 10*3/uL (ref 0.0–0.1)
Basophils Relative: 0 %
Eosinophils Absolute: 0.1 10*3/uL (ref 0.0–0.5)
Eosinophils Relative: 1 %
HCT: 36.7 % (ref 36.0–46.0)
Hemoglobin: 12 g/dL (ref 12.0–15.0)
Immature Granulocytes: 0 %
Lymphocytes Relative: 16 %
Lymphs Abs: 1.5 10*3/uL (ref 0.7–4.0)
MCH: 31.6 pg (ref 26.0–34.0)
MCHC: 32.7 g/dL (ref 30.0–36.0)
MCV: 96.6 fL (ref 80.0–100.0)
Monocytes Absolute: 0.8 10*3/uL (ref 0.1–1.0)
Monocytes Relative: 8 %
Neutro Abs: 6.8 10*3/uL (ref 1.7–7.7)
Neutrophils Relative %: 75 %
Platelets: 249 10*3/uL (ref 150–400)
RBC: 3.8 MIL/uL — ABNORMAL LOW (ref 3.87–5.11)
RDW: 13.2 % (ref 11.5–15.5)
WBC: 9.2 10*3/uL (ref 4.0–10.5)
nRBC: 0 % (ref 0.0–0.2)

## 2023-01-13 LAB — URINE CULTURE: Culture: 80000 — AB

## 2023-01-13 LAB — CK
Total CK: >6646 U/L — ABNORMAL HIGH (ref 38–234)
Total CK: 2081 U/L — ABNORMAL HIGH (ref 38–234)

## 2023-01-13 MED ORDER — ALUM & MAG HYDROXIDE-SIMETH 200-200-20 MG/5ML PO SUSP: 30.0000 mL | Freq: Four times a day (QID) | ORAL | Status: DC | PRN

## 2023-01-13 MED ORDER — ALUM & MAG HYDROXIDE-SIMETH 200-200-20 MG/5ML PO SUSP
30.0000 mL | ORAL | Status: AC
  Administered 2023-01-13: 30 mL via ORAL
  Filled 2023-01-13: qty 30

## 2023-01-13 MED ORDER — POTASSIUM CHLORIDE CRYS ER 20 MEQ PO TBCR
40.0000 meq | EXTENDED_RELEASE_TABLET | Freq: Once | ORAL | Status: AC
  Administered 2023-01-13: 40 meq via ORAL
  Filled 2023-01-13: qty 2

## 2023-01-13 MED ORDER — BISACODYL 10 MG RE SUPP
10.0000 mg | Freq: Once | RECTAL | Status: AC
  Administered 2023-01-13: 10 mg via RECTAL
  Filled 2023-01-13: qty 1

## 2023-01-13 NOTE — Progress Notes (Signed)
Mobility Specialist Progress Note:    01/13/23 1145  Mobility  Activity Refused mobility   Pt refused mobility d/t having "a rough morning". All needs met.   Lawerance Bach Mobility Specialist Please contact via Special educational needs teacher or  Rehab office at [phone]

## 2023-01-13 NOTE — Progress Notes (Signed)
Corrected lab value   CK total on 01/11/2023 was indicated as >4,100 and it needs to be corrected to 6,646. Notified Dr. Novella Olive

## 2023-01-13 NOTE — Plan of Care (Signed)

## 2023-01-13 NOTE — Hospital Course (Signed)
89 y.o.f w/ anxiety/depression, COPD, diabetes mellitus type 2, hypertension, hyperlipidemia, rhinitis, and more presented to ED with a chief complaint of being found down.  Unfortunately patient does not recall the event at all.  She could not tell why she was brought to the ED, was alert oriented to person place but not to time and had no complaints.  Was having nasal congestion and was recently placed on doxycycline for that by PCP. In the ED: Afebrile vitals are stable not hypoxic. Labs showed leukocytosis 13.8 stable hemoglobin sodium 131 AST 853 CK >4000, CT C-spine shows no acute fracture CT head shows a 5 mm left frontal lobe subcortical petechial bleed X-ray left ankle no acute findings X-ray hips osteoarthritis without acute findings X-ray knee abnormality of the tibial plateau CT knee abnormality of the tibial plateau without acute fracture Chest x-ray shows no active disease EKG shows a heart rate of 68, sinus rhythm, QTc 4 and 74 1 L bolus given in the ED followed by 500 mL bolus. Neurosurgery consulted and admitted,CK in 600 on admisison> it has nicely improved, renal function stable management IV fluids now tolerating diet.  She was treated for UTI completing antibiotics x 3 days, other issues with constipation gaseous sensation that improved with suppository and MiraLAX had chest discomfort with flat troponin no delta in the setting of demand ischemia. She was managed conservatively and she has improved. She will cont ptot and snf placement

## 2023-01-13 NOTE — TOC Progression Note (Signed)
Transition of Care [facility]) - Progression Note    Patient Details  Name: [name] [name] MRN: [id number] Date of Birth: 12-18-1931  [facility]) CM/SW Contact  Erin Sons, [place] Phone Number: 01/13/2023, 2:25 PM  Clinical Narrative:     [facility] reviewed referral and extended bed offer. CSW initiated snf auth in online portal. Status is pending. XLK#[id number]. TOC will continue to follow.    Expected Discharge Plan: Skilled Nursing Facility Barriers to Discharge: Continued Medical Work up  Expected Discharge Plan and Services       Living arrangements for the past 2 months: Single Family Home                                       Social Determinants of Health (SDOH) Interventions SDOH Screenings   Food Insecurity: No Food Insecurity (01/12/2023)  Housing: Low Risk  (01/12/2023)  Transportation Needs: No Transportation Needs (01/12/2023)  Utilities: Not At Risk (01/12/2023)  Depression (PHQ2-9): Medium Risk (04/21/2019)  Financial Resource Strain: Low Risk  (12/08/2017)  Physical Activity: Inactive (12/12/2018)  Social Connections: Moderately Integrated (12/08/2017)  Stress: No Stress Concern Present (12/08/2017)  Tobacco Use: Low Risk  (01/06/2022)   Received from [facility]    Readmission Risk Interventions    01/12/2023    3:12 PM  Readmission Risk Prevention Plan  Post Dischage Appt Not Complete  Medication Screening Complete  Transportation Screening Complete

## 2023-01-13 NOTE — Progress Notes (Signed)
When this RN entered patient room. Patient c/o left sided chest pain. She said it felt like pressure and wasn't spreading. Notified Dr. Jonathon Bellows and Hulen Shouts, charge RN. See new orders. VS completed. EKG completed. PRN xanax, maalox and tylenol given to patient.     01/13/23 0845  Vitals  BP (!) 187/78  MAP (mmHg) 102  BP Location Left Arm  BP Method Automatic  Patient Position (if appropriate) Lying  Pulse Rate 83  Pulse Rate Source Dinamap  Resp 20  MEWS COLOR  MEWS Score Color Green  Oxygen Therapy  SpO2 99 %  O2 Device Room Air  Pain Assessment  Pain Scale 0-10  Pain Score 10  Pain Type Acute pain  Pain Location Chest  Pain Orientation Left  Pain Radiating Towards none  Pain Descriptors / Indicators Pressure  Pain Frequency Constant  Pain Onset Gradual  Patients Stated Pain Goal 0  Pain Intervention(s) Medication (See eMAR)  Complaints & Interventions  Complains of Anxiety  Neuro symptoms relieved by Anti-anxiety medication  MEWS Score  MEWS Temp 0  MEWS Systolic 0  MEWS Pulse 0  MEWS RR 0  MEWS LOC 0  MEWS Score 0

## 2023-01-13 NOTE — Progress Notes (Signed)
Pt is anxious and required PRN med overnight. Tremors present per baseline. She required reorientation several times  regarding her stay here in the hospital. She is oriented to self,time, place and disoriented to situation.  She ambulated to the restroom with this RN and assistance from standard walker. Reminders to take the walker with her when she steps were provided. No acute events over night. [place] RN

## 2023-01-13 NOTE — Progress Notes (Signed)
PROGRESS NOTE [name] [name]  AYT:[id number] DOB: 1931/08/08 DOA: 01/11/2023 PCP: Joycelyn Rua, MD  Brief Narrative/Hospital Course:  89 y.o.f w/ anxiety/depression, COPD, diabetes mellitus type 2, hypertension, hyperlipidemia, rhinitis, and more presented to ED with a chief complaint of being found down.  Unfortunately patient does not recall the event at all.  She could not tell why she was brought to the ED, was alert oriented to person place but not to time and had no complaints.  Was having nasal congestion and was recently placed on doxycycline for that by PCP. In the ED: Afebrile vitals are stable not hypoxic. Labs showed leukocytosis 13.8 stable hemoglobin sodium 131 AST 853 CK >4000, CT C-spine shows no acute fracture CT head shows a 5 mm left frontal lobe subcortical petechial bleed X-ray left ankle no acute findings X-ray hips osteoarthritis without acute findings X-ray knee abnormality of the tibial plateau CT knee abnormality of the tibial plateau without acute fracture Chest x-ray shows no active disease EKG shows a heart rate of 68, sinus rhythm, QTc 4 and 74 1 L bolus given in the ED followed by 500 mL bolus. Neurosurgery consulted and admitted at any pain on IV antibiotics      Subjective: Patient seen and examined this morning Overnight patient has been afebrile, BP in 170s to 190s on higher side Overnight per nursing she had been anxious requiring as needed medication tremors at baseline needed reorientation several times oriented to self place time but disoriented to situation. Labs reviewed this morning with hyponatremia mild hypokalemia CK down to 281 CBC stable See if reports she feels gassy,also having left-sided chest discomfort and appears anxious Does not member when was her last bowel movement  Assessment and Plan: Principal Problem:   Rhabdomyolysis Active Problems:   Brain contusion (HCC)   Depression with anxiety   Type 2 diabetes mellitus without  complication, without long-term current use of insulin (HCC)   Hypertension associated with diabetes (HCC)   Hyperlipidemia associated with type 2 diabetes mellitus (HCC)   Fall at home, initial encounter   Transaminitis   UTI (urinary tract infection)   Rhabdomyolysis w/ ck > 4000: Likely traumatic found down for unknown amount of time continue IV fluid hydration CK improving, trend labs. Cont PT OT.  Encourage hydration.     Recent Labs  Lab 01/11/23 2205 01/12/23 0546 01/13/23 0425  CKTOTAL 3,557* 4,853* 2,081*    Acute encephalopathy with confusion on admission. CT head shows a 5 mm left frontal lobe subcortical petechial bleed> Neurosurgery was consulted and recommended nothing further to do, not even repeat imaging, monitoring neurocheck, holding pharmacologic prophylaxis for DVT continue PT OT.  Continue underlying UTI treatment   UTI: Urine culture pending continue ceftriaxone  Hypokalemia: Will replace  Constipation Gaseous sensation: Added Maalox, likely having constipation, will order Dulcolax suppository  Chest discomfort; EKG nonischemic, doing serial troponin check if significant delta will consult cardiology, Holding aspirin for now in the setting of patient's subcortical petechial bleed in the CT  Transaminitis: AST elevated at 153.  Previously normal likely in the setting of rhabdomyolysis continue to monitor. Hepatitis panel negative  Fall at home, initial encounter: Unknown etiology/mechanism she was found down, extensive imaging as below - CT C-spine showed no acute findings - CT head shows 5 mm left frontal lobe subcortical petechial bleed - X-ray left ankle has no acute findings - X-ray hip shows osteoarthritis without acute findings - X-ray knee shows an abnormal tibial plateau - CT knee shows  an old fracture of the tibial plateau but nothing acute - Chest x-ray shows no active disease - EKG shows a heart rate of 68, QTc 474 Continue to treat  rhabdomyolysis continue PT OT fall precaution Plan for skilled nursing facility upon discharge  HLD: On statin.monitor cpk  Hypertension Blood pressure on higher side, cont low-dose amlodipine and uptitrate as needed.  T2DM: A1c at 6.4 blood sugar controlled. Continue sliding scale insulin Recent Labs  Lab 01/12/23 0546 01/12/23 1137 01/12/23 1143 01/12/23 1642 01/12/23 2104 01/13/23 0743 01/13/23 1125  GLUCAP  --    < > 111* 121* 140* 104* 132*  HGBA1C 6.4*  --   --   --   --   --   --    < > = values in this interval not displayed.   Anxiety/depression: Continue home Xanax and Lexapro.  DVT prophylaxis: SCDs Start: 01/12/23 0506 Code Status:   Code Status: Full Code Family Communication: plan of care discussed with patient at bedside. Patient status is: Inpatient because of pending snf Level of care: Telemetry   Dispo: The patient is from: home            Anticipated disposition: SNF ONCE available Objective: Vitals last 24 hrs: Vitals:   01/12/23 2358 01/13/23 0547 01/13/23 0720 01/13/23 0845  BP: (!) 171/66 (!) 177/69  (!) 187/78  Pulse:  79  83  Resp: 20 18  20   Temp:  97.9 F (36.6 C)    TempSrc:      SpO2: 98% 96% 96% 99%   Weight change:   Physical Examination: General exam: alert awake, older than stated age HEENT:Oral mucosa moist, Ear/Nose WNL grossly Respiratory system: bilaterally clear BS, no use of accessory muscle Cardiovascular system: S1 & S2 +, No JVD. Gastrointestinal system: Abdomen soft,NT,ND, BS+ Nervous System:Alert, awake, moving extremities. Extremities: LE edema neg,distal peripheral pulses palpable.  Skin: No rashes,no icterus. MSK: Normal muscle bulk,tone, power  Medications reviewed:  Scheduled Meds:  amLODipine  2.5 mg Oral Daily   escitalopram  20 mg Oral Daily   feeding supplement  237 mL Oral BID BM   gabapentin  100 mg Oral QHS   insulin aspart  0-5 Units Subcutaneous QHS   insulin aspart  0-9 Units Subcutaneous TID  WC   loratadine  10 mg Oral Daily   mometasone-formoterol  2 puff Inhalation BID   pravastatin  10 mg Oral q1800   Continuous Infusions:  sodium chloride 150 mL/hr at 01/13/23 0616   cefTRIAXone (ROCEPHIN)  IV 1 g (01/13/23 0617)      Diet Order             Diet Heart Room service appropriate? Yes; Fluid consistency: Thin  Diet effective now                  Intake/Output Summary (Last 24 hours) at 01/13/2023 1033 Last data filed at 01/13/2023 0942 Gross per 24 hour  Intake 4658.66 ml  Output 1950 ml  Net 2708.66 ml   Net IO Since Admission: 2,908.66 mL [01/13/23 1033]  Wt Readings from Last 3 Encounters:  12/16/21 68.9 kg  10/09/21 68.9 kg  02/27/20 68 kg     Unresulted Labs (From admission, onward)     Start     Ordered   01/13/23 0852  C Difficile Quick Screen w PCR reflex  (C Difficile quick screen w PCR reflex panel )  Once, for 24 hours,   TIMED  References:    CDiff Information Tool   01/13/23 0851   01/13/23 0500  Basic metabolic panel  Daily,   R     Question:  Specimen collection method  Answer:  Lab=Lab collect   01/12/23 1450   01/13/23 0500  CBC with Differential/Platelet  Daily,   R     Question:  Specimen collection method  Answer:  Lab=Lab collect   01/12/23 1450   01/13/23 0500  Magnesium  Daily,   R     Question:  Specimen collection method  Answer:  Lab=Lab collect   01/12/23 1450   01/13/23 0500  CK  Daily,   R     Question:  Specimen collection method  Answer:  Lab=Lab collect   01/12/23 1450   01/12/23 0503  Urine Culture (for pregnant, neutropenic or urologic patients or patients with an indwelling urinary catheter)  (Urine Labs)  Add-on,   AD       Question:  Indication  Answer:  Altered mental status (if no other cause identified)   01/12/23 0502          Data Reviewed: I have personally reviewed following labs and imaging studies CBC: Recent Labs  Lab 01/11/23 2205 01/12/23 0546 01/13/23 0425  WBC 13.8* 10.8* 9.2   NEUTROABS 11.1* 8.6* 6.8  HGB 14.1 13.5 12.0  HCT 40.9 41.4 36.7  MCV 94.0 96.7 96.6  PLT 286 246 249   Basic Metabolic Panel: Recent Labs  Lab 01/11/23 2205 01/12/23 0546 01/13/23 0425  NA 131* 132* 133*  K 4.6 3.9 3.0*  CL 96* 100 102  CO2 25 23 24   GLUCOSE 132* 112* 109*  BUN 25* 23 13  CREATININE 0.79 0.63 0.45  CALCIUM 9.4 8.5* 7.8*  MG  --  2.0 1.7   GFR: CrCl cannot be calculated (Unknown ideal weight.). Liver Function Tests: Recent Labs  Lab 01/11/23 2205 01/12/23 0546  AST 153* 139*  ALT 41 41  ALKPHOS 52 49  BILITOT 1.5* 1.0  PROT 7.0 6.2*  ALBUMIN 4.1 3.6   No results for input(s): "LIPASE", "AMYLASE" in the last 168 hours. Recent Labs  Lab 01/12/23 0546  AMMONIA <10   Coagulation Profile: No results for input(s): "INR", "PROTIME" in the last 168 hours. BNP (last 3 results) No results for input(s): "PROBNP" in the last 8760 hours. HbA1C: Recent Labs    01/12/23 0546  HGBA1C 6.4*   CBG: Recent Labs  Lab 01/12/23 1137 01/12/23 1143 01/12/23 1642 01/12/23 2104 01/13/23 0743  GLUCAP 132* 111* 121* 140* 104*   Lipid Profile: No results for input(s): "CHOL", "HDL", "LDLCALC", "TRIG", "CHOLHDL", "LDLDIRECT" in the last 72 hours. Thyroid Function Tests: Recent Labs    01/12/23 0546  TSH 1.633   Sepsis Labs: Recent Labs  Lab 01/12/23 0546  PROCALCITON <0.10    No results found for this or any previous visit (from the past 240 hour(s)).  Antimicrobials: Anti-infectives (From admission, onward)    Start     Dose/Rate Route Frequency Ordered Stop   01/12/23 0515  cefTRIAXone (ROCEPHIN) 1 g in sodium chloride 0.9 % 100 mL IVPB        1 g 200 mL/hr over 30 Minutes Intravenous Every 24 hours 01/12/23 0502        Culture/Microbiology    Component Value Date/Time   SDES  09/03/2017 1725    URINE, CLEAN CATCH Performed at [facility], [address], [place], [place] [address]    [facility]  09/03/2017 1725  NONE Performed  at [facility], [address], [place], [place] [address]    CULT (A) 09/03/2017 1725    >=100,000 COLONIES/mL DIPHTHEROIDS(CORYNEBACTERIUM SPECIES) Standardized susceptibility testing for this organism is not available. Performed at [facility], [address], [place], [place] [address]    REPTSTATUS 09/05/2017 FINAL 09/03/2017 1725   Radiology Studies: US Venous Img Lower Unilateral Left (DVT)  Result Date: 01/12/2023 CLINICAL DATA:  89 year old female with a history of edema EXAM: LEFT LOWER EXTREMITY VENOUS DOPPLER ULTRASOUND TECHNIQUE: Gray-scale sonography with graded compression, as well as color Doppler and duplex ultrasound were performed to evaluate the lower extremity deep venous systems from the level of the common femoral vein and including the common femoral, femoral, profunda femoral, popliteal and calf veins including the posterior tibial, peroneal and gastrocnemius veins when visible. The superficial great saphenous vein was also interrogated. Spectral Doppler was utilized to evaluate flow at rest and with distal augmentation maneuvers in the common femoral, femoral and popliteal veins. COMPARISON:  None Available. FINDINGS: Contralateral Common Femoral Vein: Respiratory phasicity is normal and symmetric with the symptomatic side. No evidence of thrombus. Normal compressibility. Common Femoral Vein: No evidence of thrombus. Normal compressibility, respiratory phasicity and response to augmentation. Saphenofemoral Junction: No evidence of thrombus. Normal compressibility and flow on color Doppler imaging. Profunda Femoral Vein: No evidence of thrombus. Normal compressibility and flow on color Doppler imaging. Femoral Vein: No evidence of thrombus. Normal compressibility, respiratory phasicity and response to augmentation. Popliteal Vein: No evidence of thrombus. Normal compressibility, respiratory phasicity and response to augmentation. Calf Veins: No evidence of thrombus. Normal  compressibility and flow on color Doppler imaging. Superficial Great Saphenous Vein: No evidence of thrombus. Normal compressibility and flow on color Doppler imaging. Other Findings:  None. IMPRESSION: Directed duplex of the left lower extremity negative for DVT Signed, Yvone Neu. Miachel Roux, RPVI [facility] Radiology Electronically Signed   By: Gilmer Mor D.O.   On: 01/12/2023 10:41   CT Knee Left Wo Contrast  Result Date: 01/12/2023 CLINICAL DATA:  Knee trauma due to a fall. Internal derangement is suspected. Abnormal radiographs. EXAM: CT OF THE LEFT KNEE WITHOUT CONTRAST TECHNIQUE: Multidetector CT imaging of the left knee was performed according to the standard protocol. Multiplanar CT image reconstructions were also generated. RADIATION DOSE REDUCTION: This exam was performed according to the departmental dose-optimization program which includes automated exposure control, adjustment of the mA and/or kV according to patient size and/or use of iterative reconstruction technique. COMPARISON:  Left knee radiographs 01/11/2023 FINDINGS: Bones/Joint/Cartilage Diffuse bone demineralization. Prominent degenerative changes in all 3 compartments with joint space narrowing, osteophyte formation, and multiple subcortical cysts. Deformity of the lateral tibial plateau appears to be chronic, likely representing old fracture deformity and/or degenerative changes. No acute fractures are demonstrated. There is a large effusion. Ligaments Suboptimally assessed by CT. Muscles and Tendons No intramuscular mass or infiltration demonstrated. Soft tissues No soft tissue swelling or infiltration.  Vascular calcifications. IMPRESSION: 1. No acute fractures are identified. 2. Severe tricompartment degenerative changes. 3. Deformity of the lateral tibial plateau appears old, likely representing old fracture deformity and/or degenerative change. 4. Large left knee effusion.  Electronically Signed   By: Burman Nieves M.D.   On: 01/12/2023 02:36   CT Head Wo Contrast  Addendum Date: 01/12/2023   ADDENDUM REPORT: 01/12/2023 01:46 ADDENDUM: Critical Value/emergent results were called by telephone at the time of interpretation on 01/12/2023 at 12:55 am to provider  Dr. Bebe Shaggy, who verbally acknowledged these results. Electronically Signed   By: Almira Bar M.D.   On: 01/12/2023 01:46   Result Date: 01/12/2023 CLINICAL DATA:  Blunt polytrauma with recent falls, including both last night and today, with head and neck pain. EXAM: CT HEAD WITHOUT CONTRAST CT CERVICAL SPINE WITHOUT CONTRAST TECHNIQUE: Multidetector CT imaging of the head and cervical spine was performed following the standard protocol without intravenous contrast. Multiplanar CT image reconstructions of the cervical spine were also generated. RADIATION DOSE REDUCTION: This exam was performed according to the departmental dose-optimization program which includes automated exposure control, adjustment of the mA and/or kV according to patient size and/or use of iterative reconstruction technique. COMPARISON:  CT scan head 04/11/2016. Cervical spine plain film series 03/02/2017. No prior cervical spine CT. FINDINGS: CT HEAD FINDINGS Brain: There is a 5 mm left frontal lobe subcortical petechial bleed, not well seen on the axial images but is visible on coronal reconstruction series 4, image 30 and sagittal reconstruction series 5, image 40. No extra-axial or other bleeds are seen or associated mass effect. This was not present in 2017. There is mild global atrophy, mild atrophic ventriculomegaly and small-vessel disease. Senescent mineralization again noted both basal ganglia. No cortical based infarct is evident, no mass lesion or mass effect is seen and no midline shift. Basal cisterns are clear. Vascular: There is calcification of the carotid siphons and distal vertebral arteries. There are no hyperdense central  vessels. Skull: Negative for fractures or focal lesions. There is no visible scalp hematoma. Sinuses/Orbits: Negative orbits except for lens replacements. Clear paranasal sinuses and mastoids. Mild deviation of the nasal septum to the right. Other: Traumatic Brain Injury Risk Stratification Skull Fracture: No - Low/mBIG 1 Subdural Hematoma (SDH): No - Low Subarachnoid Hemorrhage [facility]): No Epidural Hematoma (EDH): No - Low/mBIG 1 Cerebral contusion, intra-axial, intraparenchymal Hemorrhage (IPH): Yes. 5 mm left frontal lobe subcortical petechial bleed with no other hemorrhage and no mass effect. Intraventricular Hemorrhage (IVH): No - Low/mBIG 1 Midline Shift > 1mm or Edema/effacement of sulci/vents: No - Low/mBIG 1 ---------------------------------------------------- CT CERVICAL SPINE FINDINGS Alignment: Slight cervical dextroscoliosis. No evidence of traumatic listhesis. There is bone-on-bone anterior atlantodental joint space loss with spurring. Skull base and vertebrae: No acute fracture is evident. There is mild osteopenia. No primary bone lesion or focal pathologic process. Left anterolateral bridging enthesopathy is noted C5-T1 and T2-3. Soft tissues and spinal canal: No prevertebral fluid or swelling. No visible canal hematoma. Minimal calcification both carotid bifurcations, very little for age. No thyroid mass. No laryngeal mass. Disc levels: There is moderate disc space loss with bidirectional osteophytes at C5-6 and C6-7, with posterior disc osteophyte complexes partially effacing the anterior CSF at these levels. There is no significant cord mass effect or spinal stenosis. The other discs are normal in heights with small endplate spurring. There are minimal posterior disc osteophyte complexes at the other cervical levels. At C6-7 there is also a small calcified central disc extrusion but it is nonstenosing. Uncinate joint and facet spurring is present multiple levels. Acquired foraminal stenosis is  moderate on the right at C3-4, bilaterally mild C4-5, bilaterally mild C5-6. Upper chest: There is biapical pleural-parenchymal scarring and bronchiolectasis. Other: None. IMPRESSION: 1. 5 mm left frontal lobe subcortical petechial bleed with no other acute intracranial CT findings or depressed skull fractures (mBIG 1-2), no associated positive mass effect. 2. Atrophy and small-vessel disease. 3. Osteopenia and degenerative change without evidence of cervical fractures or traumatic listhesis. 4.  Biapical pleural-parenchymal scarring and bronchiolectasis. 5. PRA is attempting to reach ordering provider for stat notification at time of signing. This report will be addended when contact has been made. Electronically Signed: By: Almira Bar M.D. On: 01/12/2023 00:54   CT Cervical Spine Wo Contrast  Addendum Date: 01/12/2023   ADDENDUM REPORT: 01/12/2023 01:46 ADDENDUM: Critical Value/emergent results were called by telephone at the time of interpretation on 01/12/2023 at 12:55 am to provider Dr. Bebe Shaggy, who verbally acknowledged these results. Electronically Signed   By: Almira Bar M.D.   On: 01/12/2023 01:46   Result Date: 01/12/2023 CLINICAL DATA:  Blunt polytrauma with recent falls, including both last night and today, with head and neck pain. EXAM: CT HEAD WITHOUT CONTRAST CT CERVICAL SPINE WITHOUT CONTRAST TECHNIQUE: Multidetector CT imaging of the head and cervical spine was performed following the standard protocol without intravenous contrast. Multiplanar CT image reconstructions of the cervical spine were also generated. RADIATION DOSE REDUCTION: This exam was performed according to the departmental dose-optimization program which includes automated exposure control, adjustment of the mA and/or kV according to patient size and/or use of iterative reconstruction technique. COMPARISON:  CT scan head 04/11/2016. Cervical spine plain film series 03/02/2017. No prior cervical spine CT. FINDINGS: CT  HEAD FINDINGS Brain: There is a 5 mm left frontal lobe subcortical petechial bleed, not well seen on the axial images but is visible on coronal reconstruction series 4, image 30 and sagittal reconstruction series 5, image 40. No extra-axial or other bleeds are seen or associated mass effect. This was not present in 2017. There is mild global atrophy, mild atrophic ventriculomegaly and small-vessel disease. Senescent mineralization again noted both basal ganglia. No cortical based infarct is evident, no mass lesion or mass effect is seen and no midline shift. Basal cisterns are clear. Vascular: There is calcification of the carotid siphons and distal vertebral arteries. There are no hyperdense central vessels. Skull: Negative for fractures or focal lesions. There is no visible scalp hematoma. Sinuses/Orbits: Negative orbits except for lens replacements. Clear paranasal sinuses and mastoids. Mild deviation of the nasal septum to the right. Other: Traumatic Brain Injury Risk Stratification Skull Fracture: No - Low/mBIG 1 Subdural Hematoma (SDH): No - Low Subarachnoid Hemorrhage [facility]): No Epidural Hematoma (EDH): No - Low/mBIG 1 Cerebral contusion, intra-axial, intraparenchymal Hemorrhage (IPH): Yes. 5 mm left frontal lobe subcortical petechial bleed with no other hemorrhage and no mass effect. Intraventricular Hemorrhage (IVH): No - Low/mBIG 1 Midline Shift > 1mm or Edema/effacement of sulci/vents: No - Low/mBIG 1 ---------------------------------------------------- CT CERVICAL SPINE FINDINGS Alignment: Slight cervical dextroscoliosis. No evidence of traumatic listhesis. There is bone-on-bone anterior atlantodental joint space loss with spurring. Skull base and vertebrae: No acute fracture is evident. There is mild osteopenia. No primary bone lesion or focal pathologic process. Left anterolateral bridging enthesopathy is noted C5-T1 and T2-3. Soft tissues and spinal canal: No prevertebral fluid or swelling. No visible  canal hematoma. Minimal calcification both carotid bifurcations, very little for age. No thyroid mass. No laryngeal mass. Disc levels: There is moderate disc space loss with bidirectional osteophytes at C5-6 and C6-7, with posterior disc osteophyte complexes partially effacing the anterior CSF at these levels. There is no significant cord mass effect or spinal stenosis. The other discs are normal in heights with small endplate spurring. There are minimal posterior disc osteophyte complexes at the other cervical levels. At C6-7 there is also a small calcified central disc extrusion but it is nonstenosing. Uncinate joint and facet spurring is  present multiple levels. Acquired foraminal stenosis is moderate on the right at C3-4, bilaterally mild C4-5, bilaterally mild C5-6. Upper chest: There is biapical pleural-parenchymal scarring and bronchiolectasis. Other: None. IMPRESSION: 1. 5 mm left frontal lobe subcortical petechial bleed with no other acute intracranial CT findings or depressed skull fractures (mBIG 1-2), no associated positive mass effect. 2. Atrophy and small-vessel disease. 3. Osteopenia and degenerative change without evidence of cervical fractures or traumatic listhesis. 4. Biapical pleural-parenchymal scarring and bronchiolectasis. 5. PRA is attempting to reach ordering provider for stat notification at time of signing. This report will be addended when contact has been made. Electronically Signed: By: Almira Bar M.D. On: 01/12/2023 00:54   DG Hips Bilat W or Wo Pelvis 3-4 Views  Result Date: 01/12/2023 CLINICAL DATA:  Fall EXAM: DG HIP (WITH OR WITHOUT PELVIS) 3-4V BILAT COMPARISON:  02/27/2020 FINDINGS: Severe osteoarthritic changes in the right hip with complete joint space loss, sclerosis, osteophyte formation, and flattening of the femoral head. Changes of prior left hip replacement. No hardware complicating feature. No acute fracture, subluxation or dislocation. IMPRESSION: Prior left hip  replacement. Severe osteoarthritis within the right hip. No acute bony abnormality. Electronically Signed   By: Charlett Nose M.D.   On: 01/12/2023 01:06   DG Ankle Complete Left  Result Date: 01/12/2023 CLINICAL DATA:  Fall EXAM: LEFT ANKLE COMPLETE - 3+ VIEW COMPARISON:  None Available. FINDINGS: No acute bony abnormality. Specifically, no fracture, subluxation, or dislocation. Plantar calcaneal spur. Soft tissues are intact. IMPRESSION: No acute bony abnormality. Electronically Signed   By: Charlett Nose M.D.   On: 01/12/2023 01:04   DG Knee Complete 4 Views Left  Result Date: 01/12/2023 CLINICAL DATA:  Fall EXAM: LEFT KNEE - COMPLETE 4+ VIEW COMPARISON:  None Available. FINDINGS: Depressed appearance of the lateral tibial plateau with associated sclerosis in the lateral tibial plateau and the lateral femoral condyle. I favor this is degenerative in nature, but given the depressed appearance, this could be further evaluated with CT. Moderate joint effusion. Degenerative changes throughout the left knee with joint space narrowing and spurring. No subluxation or dislocation. IMPRESSION: Depressed appearance of the lateral tibial plateau. While there is associated sclerosis within the lateral tibial plateau and the lateral femoral condyle suggesting degenerative changes, given the depressed appearance, recommend further evaluation with CT. Moderate joint effusion. Electronically Signed   By: Charlett Nose M.D.   On: 01/12/2023 01:03   DG Knee Complete 4 Views Right  Result Date: 01/12/2023 CLINICAL DATA:  Fall EXAM: RIGHT KNEE - COMPLETE 4+ VIEW COMPARISON:  12/16/2021 FINDINGS: No acute bony abnormality. Specifically, no fracture, subluxation, or dislocation. No joint effusion. Slight joint space narrowing in the medial compartment. Soft tissues intact. IMPRESSION: No acute bony abnormality. Electronically Signed   By: Charlett Nose M.D.   On: 01/12/2023 01:01   DG Chest Port 1 View  Result Date:  01/11/2023 CLINICAL DATA:  Fall. EXAM: PORTABLE CHEST 1 VIEW COMPARISON:  Chest radiograph dated 10/09/2021. FINDINGS: There is background of emphysema and mild chronic interstitial coarsening. No focal consolidation, pleural effusion, or pneumothorax. The cardiac silhouette is within limits. Atherosclerotic calcification of the aorta. Degenerative changes of spine. No acute osseous pathology IMPRESSION: No active disease. Electronically Signed   By: Elgie Collard M.D.   On: 01/11/2023 23:34     LOS: 1 day   Lanae Boast, MD [facility]  01/13/2023, 10:33 AM

## 2023-01-13 NOTE — Plan of Care (Signed)
  Problem: Fluid Volume: Goal: Ability to maintain a balanced intake and output will improve Outcome: Progressing   

## 2023-01-14 DIAGNOSIS — E43 Unspecified severe protein-calorie malnutrition: Secondary | ICD-10-CM | POA: Insufficient documentation

## 2023-01-14 DIAGNOSIS — T796XXA Traumatic ischemia of muscle, initial encounter: Secondary | ICD-10-CM | POA: Diagnosis not present

## 2023-01-14 LAB — CBC WITH DIFFERENTIAL/PLATELET
Abs Immature Granulocytes: 0.05 10*3/uL (ref 0.00–0.07)
Basophils Absolute: 0 10*3/uL (ref 0.0–0.1)
Basophils Relative: 0 %
Eosinophils Absolute: 0.1 10*3/uL (ref 0.0–0.5)
Eosinophils Relative: 1 %
HCT: 40 % (ref 36.0–46.0)
Hemoglobin: 12.9 g/dL (ref 12.0–15.0)
Immature Granulocytes: 1 %
Lymphocytes Relative: 18 %
Lymphs Abs: 1.6 10*3/uL (ref 0.7–4.0)
MCH: 31.2 pg (ref 26.0–34.0)
MCHC: 32.3 g/dL (ref 30.0–36.0)
MCV: 96.9 fL (ref 80.0–100.0)
Monocytes Absolute: 0.8 10*3/uL (ref 0.1–1.0)
Monocytes Relative: 9 %
Neutro Abs: 6.5 10*3/uL (ref 1.7–7.7)
Neutrophils Relative %: 71 %
Platelets: 264 10*3/uL (ref 150–400)
RBC: 4.13 MIL/uL (ref 3.87–5.11)
RDW: 13.1 % (ref 11.5–15.5)
WBC: 9.2 10*3/uL (ref 4.0–10.5)
nRBC: 0 % (ref 0.0–0.2)

## 2023-01-14 LAB — BASIC METABOLIC PANEL
Anion gap: 8 (ref 5–15)
BUN: 7 mg/dL — ABNORMAL LOW (ref 8–23)
CO2: 24 mmol/L (ref 22–32)
Calcium: 8.2 mg/dL — ABNORMAL LOW (ref 8.9–10.3)
Chloride: 101 mmol/L (ref 98–111)
Creatinine, Ser: 0.42 mg/dL — ABNORMAL LOW (ref 0.44–1.00)
GFR, Estimated: >60 mL/min (ref >60)
Glucose, Bld: 123 mg/dL — ABNORMAL HIGH (ref 70–99)
Potassium: 3.5 mmol/L (ref 3.5–5.1)
Sodium: 133 mmol/L — ABNORMAL LOW (ref 135–145)

## 2023-01-14 LAB — MAGNESIUM: Magnesium: 1.8 mg/dL (ref 1.7–2.4)

## 2023-01-14 LAB — GLUCOSE, CAPILLARY
Glucose-Capillary: 146 mg/dL — ABNORMAL HIGH (ref 70–99)
Glucose-Capillary: 226 mg/dL — ABNORMAL HIGH (ref 70–99)
Glucose-Capillary: 198 mg/dL — ABNORMAL HIGH (ref 70–99)
Glucose-Capillary: 168 mg/dL — ABNORMAL HIGH (ref 70–99)

## 2023-01-14 LAB — CK
Total CK: 965 U/L — ABNORMAL HIGH (ref 38–234)
Total CK: 733 U/L — ABNORMAL HIGH (ref 38–234)

## 2023-01-14 MED ORDER — ADULT MULTIVITAMIN W/MINERALS CH
1.0000 | ORAL_TABLET | Freq: Every day | ORAL | Status: DC
  Administered 2023-01-14 – 2023-01-15 (×2): 1 via ORAL
  Filled 2023-01-14 (×2): qty 1

## 2023-01-14 NOTE — TOC Progression Note (Signed)
Transition of Care [facility]) - Progression Note    Patient Details  Name: BRINLYN CENA MRN: [id number] Date of Birth: 10-Oct-1931  Transition of Care [facility] CM/SW Contact  Erin Sons, [place] Phone Number: 01/14/2023, 3:11 PM  Clinical Narrative:     Pt's SNF auth approved 10/3- 10/7. Ref# [id number]  [facility] can admit pt tomorrow.    Expected Discharge Plan: Skilled Nursing Facility Barriers to Discharge: Continued Medical Work up  Expected Discharge Plan and Services       Living arrangements for the past 2 months: Single Family Home                                       Social Determinants of Health (SDOH) Interventions SDOH Screenings   Food Insecurity: No Food Insecurity (01/12/2023)  Housing: Low Risk  (01/12/2023)  Transportation Needs: No Transportation Needs (01/12/2023)  Utilities: Not At Risk (01/12/2023)  Depression (PHQ2-9): Medium Risk (04/21/2019)  Financial Resource Strain: Low Risk  (12/08/2017)  Physical Activity: Inactive (12/12/2018)  Social Connections: Moderately Integrated (12/08/2017)  Stress: No Stress Concern Present (12/08/2017)  Tobacco Use: Low Risk  (01/06/2022)   Received from [facility]    Readmission Risk Interventions    01/12/2023    3:12 PM  Readmission Risk Prevention Plan  Post Dischage Appt Not Complete  Medication Screening Complete  Transportation Screening Complete

## 2023-01-14 NOTE — Evaluation (Signed)
Occupational Therapy Evaluation Patient Details Name: [name] [name] MRN: [id number] DOB: 1931/07/15 Today's Date: 01/14/2023   History of Present Illness [name] [name] is a 89 y.o. female with medical history significant of anxiety, depression, COPD, diabetes mellitus type 2, hypertension, hyperlipidemia, rhinitis, and more presents the ED with a chief complaint of being found down.  Unfortunately patient does not recall the event at all.  She tells me she does not know why she is here.  She reports that at the time of my exam she is having no pain.  She is not short of breath.  She has no nausea.  She reports she does have some nasal congestion that is chronic.  Looks like she was recently put on Doxy for it.  She reports that her PCP put her on antibiotic once, and then when she asked for it again he did not want to do it again.  She reports she has been using nasal spray for this at home.  Patient is alert and oriented to person and place, but not to time.  She has no other complaints at this time.   Clinical Impression   Pt agreeable to OT evaluation. Pt seemed mildly confused but followed commands well. Pt demonstrates B UE weakness with mild limitations in shoulder range of motion. Pt required min A for bed mobility and min to mod A for transfer to the chair. Much assist needed for lower body ADL's at this time. Pt was left in the chair with call bell within reach. Pt will benefit from continued OT in the hospital and recommended venue below to increase strength, balance, and endurance for safe ADL's.          If plan is discharge home, recommend the following: A lot of help with walking and/or transfers;A lot of help with bathing/dressing/bathroom;Assistance with cooking/housework;Assist for transportation;Help with stairs or ramp for entrance;Direct supervision/assist for medications management    Functional Status Assessment  Patient has had a recent decline in their functional status  and demonstrates the ability to make significant improvements in function in a reasonable and predictable amount of time.  Equipment Recommendations  None recommended by OT           Precautions / Restrictions Precautions Precautions: Fall Restrictions Weight Bearing Restrictions: No      Mobility Bed Mobility Overal bed mobility: Needs Assistance Bed Mobility: Supine to Sit     Supine to sit: Min assist     General bed mobility comments: increased time, labored movement    Transfers Overall transfer level: Needs assistance Equipment used: Rolling walker (2 wheels) Transfers: Sit to/from Stand, Bed to chair/wheelchair/BSC Sit to Stand: Min assist, Mod assist     Step pivot transfers: Min assist, Mod assist     General transfer comment: increased time      Balance Overall balance assessment: Needs assistance Sitting-balance support: Feet supported, No upper extremity supported Sitting balance-Leahy Scale: Fair Sitting balance - Comments: seated at EOB   Standing balance support: Reliant on assistive device for balance, During functional activity, Bilateral upper extremity supported Standing balance-Leahy Scale: Poor Standing balance comment: poor to fair with RW                           ADL either performed or assessed with clinical judgement   ADL Overall ADL's : Needs assistance/impaired     Grooming: Set up;Minimal assistance;Sitting   Upper Body Bathing: Set up;Minimal assistance;Sitting  Lower Body Bathing: Maximal assistance;Sitting/lateral leans   Upper Body Dressing : Set up;Minimal assistance;Sitting   Lower Body Dressing: Maximal assistance;Sitting/lateral leans Lower Body Dressing Details (indicate cue type and reason): Pt unable to doff socks seated in chair. Toilet Transfer: Minimal assistance;Moderate assistance;Rolling walker (2 wheels);Stand-pivot Statistician Details (indicate cue type and reason): Simulated via EOB to  chair transfer Toileting- Clothing Manipulation and Hygiene: Maximal assistance;Sitting/lateral lean;Sit to/from stand       Functional mobility during ADLs: Moderate assistance;Rolling walker (2 wheels)       Vision Baseline Vision/History: 1 Wears glasses Ability to See in Adequate Light: 1 Impaired Patient Visual Report: No change from baseline Vision Assessment?: No apparent visual deficits     Perception Perception: Not tested       Praxis Praxis: Not tested       Pertinent Vitals/Pain Pain Assessment Pain Assessment: Faces Faces Pain Scale: Hurts a little bit Pain Location: R LE Pain Descriptors / Indicators: Sore Pain Intervention(s): Limited activity within patient's tolerance, Monitored during session, Repositioned     Extremity/Trunk Assessment Upper Extremity Assessment Upper Extremity Assessment: Generalized weakness (Limited to ~75% of available range for shoulder flexion with mild increase with P/ROM but still not full range.)   Lower Extremity Assessment Lower Extremity Assessment: Defer to PT evaluation   Cervical / Trunk Assessment Cervical / Trunk Assessment: Kyphotic   Communication Communication Communication: Hearing impairment   Cognition Arousal: Alert Behavior During Therapy: WFL for tasks assessed/performed Overall Cognitive Status: History of cognitive impairments - at baseline                                                        Home Living Family/patient expects to be discharged to:: Private residence Living Arrangements: Alone Available Help at Discharge: Family;Available PRN/intermittently Type of Home: House Home Access: Stairs to enter [employer] of Steps: 2-3 Entrance Stairs-Rails: Right;Left;Can reach both Home Layout: One level     Bathroom Shower/Tub: Chief Strategy Officer: Standard Bathroom Accessibility: Yes   Home Equipment: Agricultural consultant (2 wheels);Shower  seat;Grab bars - tub/shower   Additional Comments: per chart review      Prior Functioning/Environment Prior Level of Function : Independent/Modified Independent             Mobility Comments: household and short distanced comunity ambulator without AD, does not drive ADLs Comments: Pt reports independence for ADL's with assist for IADL's.        OT Problem List: Decreased strength;Decreased range of motion;Decreased activity tolerance;Impaired balance (sitting and/or standing)      OT Treatment/Interventions: Self-care/ADL training;Therapeutic exercise;Therapeutic activities;Patient/family education;Balance training    OT Goals(Current goals can be found in the care plan section) Acute Rehab OT Goals Patient Stated Goal: Improve function. OT Goal Formulation: With patient Time For Goal Achievement: 01/28/23 Potential to Achieve Goals: Good  OT Frequency: Min 2X/week                                   End of Session Equipment Utilized During Treatment: Rolling walker (2 wheels)  Activity Tolerance: Patient tolerated treatment well Patient left: in chair;with chair alarm set;with call bell/phone within reach;Other (comment) (MD in the room with pt)  OT Visit Diagnosis: Unsteadiness on  feet (R26.81);Other abnormalities of gait and mobility (R26.89);Muscle weakness (generalized) (M62.81)                Time: 1610-9604 OT Time Calculation (min): 16 min Charges:  OT General Charges $OT Visit: 1 Visit OT Evaluation $OT Eval Low Complexity: 1 Low  Ashton Belote OT, MOT  Danie Chandler 01/14/2023, 9:32 AM

## 2023-01-14 NOTE — Progress Notes (Signed)
Physical Therapy Treatment Patient Details Name: [name] [name] MRN: [id number] DOB: 01-12-1932 Today's Date: 01/14/2023   History of Present Illness [name] [name] is a 89 y.o. female with medical history significant of anxiety, depression, COPD, diabetes mellitus type 2, hypertension, hyperlipidemia, rhinitis, and more presents the ED with a chief complaint of being found down.  Unfortunately patient does not recall the event at all.  She tells me she does not know why she is here.  She reports that at the time of my exam she is having no pain.  She is not short of breath.  She has no nausea.  She reports she does have some nasal congestion that is chronic.  Looks like she was recently put on Doxy for it.  She reports that her PCP put her on antibiotic once, and then when she asked for it again he did not want to do it again.  She reports she has been using nasal spray for this at home.  Patient is alert and oriented to person and place, but not to time.  She has no other complaints at this time.    PT Comments  Patient presents seated in chair (assisted by nursing staff) and agreeable for therapy.  Patient requires frequent repeated verbal cueing while completing exercises mostly due to [facility], requested to ambulate to bathroom to attempt a bowel movement, but very unsteady and shaky when standing and limited to a few steps at bedside before having to sit due to fall risk.  Patient had to transfer to [facility] to attempt bowel movement - nursing staff notified.  Patient will benefit from continued skilled physical therapy in hospital and recommended venue below to increase strength, balance, endurance for safe ADLs and gait.     If plan is discharge home, recommend the following: A lot of help with bathing/dressing/bathroom;A lot of help with walking and/or transfers;Help with stairs or ramp for entrance;Assistance with cooking/housework   Can travel by private vehicle     Yes  Equipment Recommendations   None recommended by PT    Recommendations for Other Services       Precautions / Restrictions Precautions Precautions: Fall Restrictions Weight Bearing Restrictions: No     Mobility  Bed Mobility               General bed mobility comments: Patient presents seated in chair (assisted by nursing staff)    Transfers Overall transfer level: Needs assistance Equipment used: Rolling walker (2 wheels) Transfers: Sit to/from Stand, Bed to chair/wheelchair/BSC Sit to Stand: Min assist, Mod assist   Step pivot transfers: Mod assist       General transfer comment: unsteady labored shaky movement    Ambulation/Gait Ambulation/Gait assistance: Min assist Gait Distance (Feet): 4 Feet Assistive device: Rolling walker (2 wheels) Gait Pattern/deviations: Decreased step length - right, Decreased step length - left, Decreased stride length, Knees buckling Gait velocity: slow     General Gait Details: limited to a few shaky unsteady side steps before having to sit due to fall risk, weakness   Stairs             Wheelchair Mobility     Tilt Bed    Modified Rankin (Stroke Patients Only)       Balance Overall balance assessment: Needs assistance Sitting-balance support: Feet supported, No upper extremity supported Sitting balance-Leahy Scale: Fair Sitting balance - Comments: fair/good seated in chair   Standing balance support: Reliant on assistive device for balance, No upper  extremity supported, Bilateral upper extremity supported Standing balance-Leahy Scale: Poor Standing balance comment: using RW                            Cognition Arousal: Alert Behavior During Therapy: WFL for tasks assessed/performed Overall Cognitive Status: Within Functional Limits for tasks assessed                                          Exercises General Exercises - Lower Extremity Long Arc Quad: Seated, AROM, Strengthening, Both, 10 reps Toe  Raises: Seated, AROM, Strengthening, Both, 10 reps Heel Raises: Seated, AROM, Strengthening, Both, 10 reps    General Comments        Pertinent Vitals/Pain Pain Assessment Pain Assessment: No/denies pain    Home Living                          Prior Function            PT Goals (current goals can now be found in the care plan section) Acute Rehab PT Goals Patient Stated Goal: return home after rehab PT Goal Formulation: With patient Time For Goal Achievement: 01/26/23 Potential to Achieve Goals: Good Progress towards PT goals: Progressing toward goals    Frequency    Min 3X/week      PT Plan      Co-evaluation              AM-PAC PT "6 Clicks" Mobility   Outcome Measure  Help needed turning from your back to your side while in a flat bed without using bedrails?: A Lot Help needed moving from lying on your back to sitting on the side of a flat bed without using bedrails?: A Lot Help needed moving to and from a bed to a chair (including a wheelchair)?: A Lot Help needed standing up from a chair using your arms (e.g., wheelchair or bedside chair)?: A Lot Help needed to walk in hospital room?: A Lot Help needed climbing 3-5 steps with a railing? : A Lot 6 Click Score: 12    End of Session   Activity Tolerance: Patient tolerated treatment well;Patient limited by fatigue Patient left: with call bell/phone within reach;Other (comment) (Patient left sitting on [facility]) Nurse Communication: Mobility status PT Visit Diagnosis: Unsteadiness on feet (R26.81);Other abnormalities of gait and mobility (R26.89);Muscle weakness (generalized) (M62.81)     Time: 1610-9604 PT Time Calculation (min) (ACUTE ONLY): 21 min  Charges:    $Therapeutic Exercise: 8-22 mins $Therapeutic Activity: 8-22 mins PT General Charges $$ ACUTE PT VISIT: 1 Visit                     1:49 PM, 01/14/23 Ocie Bob, MPT Physical Therapist with [facility] 336 [phone] office [phone] mobile phone

## 2023-01-14 NOTE — Progress Notes (Signed)
Initial Nutrition Assessment  DOCUMENTATION CODES:   Severe malnutrition in context of chronic illness  INTERVENTION:   Ensure Enlive po BID, each supplement provides 350 kcal and 20 grams of protein. MVI with minerals daily. Magic cup TID with meals, each supplement provides 290 kcal and 9 grams of protein.  NUTRITION DIAGNOSIS:   Severe Malnutrition related to chronic illness (COPD) as evidenced by energy intake < or equal to 75% for > or equal to 1 month, severe muscle depletion.  GOAL:   Patient will meet greater than or equal to 90% of their needs  MONITOR:   PO intake, Supplement acceptance  REASON FOR ASSESSMENT:   Malnutrition Screening Tool    ASSESSMENT:   89 yo female admitted with rhabdomyolysis after being found down at home, UTI. PMH includes DM, HTN, HLD, COPD, anxiety.  CT head showed left frontal lobe subcortical petechial bleed. No further work-up planned.   Patient states that she has been eating poorly for about 2 months d/t no appetite. She just doesn't feel like eating. She has been drinking Ensure supplements at home, one carton per day. She likes the chocolate flavor. She says she is going to rehab in [facility] after dinner today. Encouraged patient to drink at least 2 cartons of Ensure per day while in rehab.    Labs reviewed. Na 133 CBG: [phone]  Medications reviewed and include Novolog SSI QID, IV Rocephin. IVF: NS at 100 ml/h  Weight history reviewed. No significant weight changes noted from June 2020 to September 2023. Patient sitting in chair, so unable to obtain a bed weight at this time.   NUTRITION - FOCUSED PHYSICAL EXAM:  Flowsheet Row Most Recent Value  Orbital Region Moderate depletion  Upper Arm Region Mild depletion  Thoracic and Lumbar Region Mild depletion  Buccal Region Moderate depletion  Temple Region Severe depletion  Clavicle Bone Region Severe depletion  Clavicle and Acromion Bone Region Moderate depletion   Scapular Bone Region Moderate depletion  Dorsal Hand Severe depletion  Patellar Region Unable to assess  Anterior Thigh Region Unable to assess  Posterior Calf Region Unable to assess  Edema (RD Assessment) Unable to assess  Hair Reviewed  Eyes Reviewed  Mouth Reviewed  Skin Reviewed  Nails Reviewed       Diet Order:   Diet Order             Diet Heart Room service appropriate? Yes; Fluid consistency: Thin  Diet effective now                   EDUCATION NEEDS:   Education needs have been addressed  Skin:  Skin Assessment: Reviewed RN Assessment  Last BM:  10/2  Height:   Ht Readings from Last 1 Encounters:  12/16/21 5\' 2"  (1.575 m)    Weight:   Wt Readings from Last 1 Encounters:  12/16/21 68.9 kg    BMI:  There is no current height or weight on file to calculate BMI.  Estimated Nutritional Needs:   Kcal:  1600-1800  Protein:  75-85 gm  Fluid:  1.6-1.8 L   Gabriel Rainwater RD, LDN, CNSC Please refer to Amion for contact information.

## 2023-01-14 NOTE — Progress Notes (Signed)
PROGRESS NOTE [name] [name]  ZOX:[id number] DOB: March 22, 1932 DOA: 01/11/2023 PCP: Joycelyn Rua, MD  Brief Narrative/Hospital Course:  89 y.o.f w/ anxiety/depression, COPD, diabetes mellitus type 2, hypertension, hyperlipidemia, rhinitis, and more presented to ED with a chief complaint of being found down.  Unfortunately patient does not recall the event at all.  She could not tell why she was brought to the ED, was alert oriented to person place but not to time and had no complaints.  Was having nasal congestion and was recently placed on doxycycline for that by PCP. In the ED: Afebrile vitals are stable not hypoxic. Labs showed leukocytosis 13.8 stable hemoglobin sodium 131 AST 853 CK >4000, CT C-spine shows no acute fracture CT head shows a 5 mm left frontal lobe subcortical petechial bleed X-ray left ankle no acute findings X-ray hips osteoarthritis without acute findings X-ray knee abnormality of the tibial plateau CT knee abnormality of the tibial plateau without acute fracture Chest x-ray shows no active disease EKG shows a heart rate of 68, sinus rhythm, QTc 4 and 74 1 L bolus given in the ED followed by 500 mL bolus. Neurosurgery consulted and admitted,CK in 600 on admisison> it has nicely improved, renal function stable management IV fluids now tolerating diet.  She was treated for UTI completing antibiotics x 3 days, other issues with constipation gaseous sensation that improved with suppository and MiraLAX had chest discomfort with flat troponin no delta in the setting of demand ischemia. She was managed conservatively and she has improved. She will cont ptot and snf placement      Subjective: Patient seen and examined this morning resting comfortably work with PT OT and moved from bed to the bedside chair, has significant chronic  No acute events overnight.   Yesterday she had a good bowel movement with his no gaseous issues or chest discomfort issues  Assessment and  Plan: Principal Problem:   Rhabdomyolysis Active Problems:   Brain contusion (HCC)   Depression with anxiety   Type 2 diabetes mellitus without complication, without long-term current use of insulin (HCC)   Hypertension associated with diabetes (HCC)   Hyperlipidemia associated with type 2 diabetes mellitus (HCC)   Fall at home, initial encounter   Transaminitis   UTI (urinary tract infection)   Protein-calorie malnutrition, severe   Rhabdomyolysis w/ ck > 4000: Likely traumatic found down for unknown amount of time continue IV fluid hydration gently Eliphos remained stable CK is nicely improving. Cont PT OT.  Encourage hydration.     Recent Labs  Lab 01/11/23 2205 01/12/23 0546 01/13/23 0425 01/14/23 0435  CKTOTAL 8,119* 4,853* 2,081* 965*    Acute metabolic encephalopathy with confusion on admission. CT head shows a 5 mm left frontal lobe subcortical petechial bleed> Neurosurgery was consulted and recommended nothing further to do, not even repeat imaging, monitoring neurocheck, holding pharmacologic prophylaxis for DVT continue PT OT.  Continue underlying UTI treatment   UTI: Complete antibiotics x 3 days today   Hypokalemia: Resolved  Constipation Gaseous sensation: Constipation relieved with Dulcolax, continue maalox prn  Chest discomfort; EKG nonischemic, serial troponins flat and no delta .Holding aspirin for now in the setting of patient's subcortical petechial bleed in the CT  Transaminitis: AST elevated at 153.  Previously normal likely in the setting of rhabdomyolysis continue to monitor. Hepatitis panel negative  Fall at home, initial encounter: Unknown etiology/mechanism she was found down, extensive imaging as below - CT C-spine showed no acute findings - CT head shows 5  mm left frontal lobe subcortical petechial bleed - X-ray left ankle has no acute findings - X-ray hip shows osteoarthritis without acute findings - X-ray knee shows an abnormal tibial  plateau - CT knee shows an old fracture of the tibial plateau but nothing acute - Chest x-ray shows no active disease - EKG shows a heart rate of 68, QTc 474 Continue to treat rhabdomyolysis continue PT OT fall precaution Plan for skilled nursing facility upon discharge  HLD: On statin.monitor cpk  Hypertension Blood pressure remains on higher side, cont low-dose amlodipine, add as needed hydralazine, BP does fluctuate up to 120s- 190s  T2DM: A1c at 6.4 blood sugar controlled. Continue sliding scale insulin Recent Labs  Lab 01/12/23 0546 01/12/23 1137 01/13/23 1125 01/13/23 1634 01/13/23 2143 01/14/23 0741 01/14/23 1121  GLUCAP  --    < > 132* 141* 114* 146* 226*  HGBA1C 6.4*  --   --   --   --   --   --    < > = values in this interval not displayed.   Anxiety/depression: Continue home Xanax and Lexapro.  DVT prophylaxis: SCDs Start: 01/12/23 0506 Code Status:   Code Status: Full Code Family Communication: plan of care discussed with patient at bedside. Patient status is: Inpatient because of pending snf Level of care: Telemetry   Dispo: The patient is from: home            Anticipated disposition: SNF tomorrow  Objective: Vitals last 24 hrs: Vitals:   01/13/23 2256 01/14/23 0512 01/14/23 0725 01/14/23 0806  BP: (!) 156/64 (!) 193/107 (!) 175/62   Pulse: 83 97 78   Resp:  16    Temp:  (!) 97.5 F (36.4 C) 97.8 F (36.6 C)   TempSrc:  Oral Oral   SpO2:  98% 99% 99%   Weight change:   Physical Examination: General exam: alert awake, oriented  HEENT:Oral mucosa moist, Ear/Nose WNL grossly Respiratory system: Bilaterally clear BS,no use of accessory muscle Cardiovascular system: S1 & S2 +, No JVD. Gastrointestinal system: Abdomen soft,NT,ND, BS+ Nervous System: Alert, awake, moving all extremities,and following commands. Extremities: LE edema neg,distal peripheral pulses palpable and warm.  Skin: No rashes,no icterus. MSK: Normal muscle bulk,tone, power    Medications reviewed:  Scheduled Meds:  amLODipine  2.5 mg Oral Daily   escitalopram  20 mg Oral Daily   feeding supplement  237 mL Oral BID BM   gabapentin  100 mg Oral QHS   insulin aspart  0-5 Units Subcutaneous QHS   insulin aspart  0-9 Units Subcutaneous TID WC   loratadine  10 mg Oral Daily   mometasone-formoterol  2 puff Inhalation BID   multivitamin with minerals  1 tablet Oral Daily   pravastatin  10 mg Oral q1800   Continuous Infusions:  sodium chloride 100 mL/hr at 01/14/23 1016      Diet Order             Diet Heart Room service appropriate? Yes; Fluid consistency: Thin  Diet effective now                  Intake/Output Summary (Last 24 hours) at 01/14/2023 1314 Last data filed at 01/14/2023 1100 Gross per 24 hour  Intake 1539.03 ml  Output 4050 ml  Net -2510.97 ml   Net IO Since Admission: 397.69 mL [01/14/23 1314]  Wt Readings from Last 3 Encounters:  12/16/21 68.9 kg  10/09/21 68.9 kg  02/27/20 68 kg  Unresulted Labs (From admission, onward)     Start     Ordered   01/14/23 1200  CK  Once,   R       Question:  Specimen collection method  Answer:  Lab=Lab collect   01/14/23 0921   01/13/23 0500  Basic metabolic panel  Daily,   R     Question:  Specimen collection method  Answer:  Lab=Lab collect   01/12/23 1450   01/13/23 0500  CBC with Differential/Platelet  Daily,   R     Question:  Specimen collection method  Answer:  Lab=Lab collect   01/12/23 1450   01/13/23 0500  Magnesium  Daily,   R     Question:  Specimen collection method  Answer:  Lab=Lab collect   01/12/23 1450   01/13/23 0500  CK  Daily,   R     Question:  Specimen collection method  Answer:  Lab=Lab collect   01/12/23 1450          Data Reviewed: I have personally reviewed following labs and imaging studies CBC: Recent Labs  Lab 01/11/23 2205 01/12/23 0546 01/13/23 0425 01/14/23 0435  WBC 13.8* 10.8* 9.2 9.2  NEUTROABS 11.1* 8.6* 6.8 6.5  HGB 14.1 13.5 12.0  12.9  HCT 40.9 41.4 36.7 40.0  MCV 94.0 96.7 96.6 96.9  PLT 286 246 249 264   Basic Metabolic Panel: Recent Labs  Lab 01/11/23 2205 01/12/23 0546 01/13/23 0425 01/14/23 0435  NA 131* 132* 133* 133*  K 4.6 3.9 3.0* 3.5  CL 96* 100 102 101  CO2 25 23 24 24   GLUCOSE 132* 112* 109* 123*  BUN 25* 23 13 7*  CREATININE 0.79 0.63 0.45 0.42*  CALCIUM 9.4 8.5* 7.8* 8.2*  MG  --  2.0 1.7 1.8   GFR: CrCl cannot be calculated (Unknown ideal weight.). Liver Function Tests: Recent Labs  Lab 01/11/23 2205 01/12/23 0546  AST 153* 139*  ALT 41 41  ALKPHOS 52 49  BILITOT 1.5* 1.0  PROT 7.0 6.2*  ALBUMIN 4.1 3.6   No results for input(s): "LIPASE", "AMYLASE" in the last 168 hours. Recent Labs  Lab 01/12/23 0546  AMMONIA <10   Coagulation Profile: No results for input(s): "INR", "PROTIME" in the last 168 hours. BNP (last 3 results) No results for input(s): "PROBNP" in the last 8760 hours. HbA1C: Recent Labs    01/12/23 0546  HGBA1C 6.4*   CBG: Recent Labs  Lab 01/13/23 1125 01/13/23 1634 01/13/23 2143 01/14/23 0741 01/14/23 1121  GLUCAP 132* 141* 114* 146* 226*   Lipid Profile: No results for input(s): "CHOL", "HDL", "LDLCALC", "TRIG", "CHOLHDL", "LDLDIRECT" in the last 72 hours. Thyroid Function Tests: Recent Labs    01/12/23 0546  TSH 1.633   Sepsis Labs: Recent Labs  Lab 01/12/23 0546  PROCALCITON <0.10    Recent Results (from the past 240 hour(s))  Urine Culture (for pregnant, neutropenic or urologic patients or patients with an indwelling urinary catheter)     Status: Abnormal   Collection Time: 01/12/23  3:25 AM   Specimen: Urine, Clean Catch  Result Value Ref Range Status   Specimen Description   Final    URINE, CLEAN CATCH Performed at [facility], [address] [place], [place] [address]    Special Requests   Final    NONE Performed at [facility], [address] [place], [place] [address]    Culture (A)  Final    80,000 COLONIES/mL  AEROCOCCUS SPECIES Standardized  susceptibility testing for this organism is not available. Performed at [facility], [address], [place], [place] [address]    Report Status 01/13/2023 FINAL  Final    Antimicrobials: Anti-infectives (From admission, onward)    Start     Dose/Rate Route Frequency Ordered Stop   01/12/23 0515  cefTRIAXone (ROCEPHIN) 1 g in sodium chloride 0.9 % 100 mL IVPB  Status:  Discontinued        1 g 200 mL/hr over 30 Minutes Intravenous Every 24 hours 01/12/23 0502 01/14/23 1227      Culture/Microbiology    Component Value Date/Time   SDES  01/12/2023 0325    URINE, CLEAN CATCH Performed at [facility], [address] [place], [place] [address]    [facility]  01/12/2023 0325    NONE Performed at [facility], [address], [place], [place] [address]    CULT (A) 01/12/2023 0325    80,000 COLONIES/mL AEROCOCCUS SPECIES Standardized susceptibility testing for this organism is not available. Performed at [facility], [address] [place], [place] [address]    REPTSTATUS 01/13/2023 FINAL 01/12/2023 2440   Radiology Studies: No results found.   LOS: 2 days   Lanae Boast, MD [facility]  01/14/2023, 1:14 PM

## 2023-01-14 NOTE — Plan of Care (Signed)
  Problem: Acute Rehab OT Goals (only OT should resolve) Goal: Pt. Will Perform Grooming Flowsheets (Taken 01/14/2023 0934) Pt Will Perform Grooming:  with supervision  standing Goal: Pt. Will Perform Lower Body Bathing Flowsheets (Taken 01/14/2023 0934) Pt Will Perform Lower Body Bathing:  with min assist  with contact guard assist  sitting/lateral leans Goal: Pt. Will Perform Upper Body Dressing Flowsheets (Taken 01/14/2023 0934) Pt Will Perform Upper Body Dressing:  with modified independence  sitting Goal: Pt. Will Perform Lower Body Dressing Flowsheets (Taken 01/14/2023 0934) Pt Will Perform Lower Body Dressing:  with contact guard assist  with min assist  sitting/lateral leans Goal: Pt. Will Transfer To Toilet Flowsheets (Taken 01/14/2023 [phone]) Pt Will Transfer to Toilet:  with modified independence  stand pivot transfer Goal: Pt. Will Perform Toileting-Clothing Manipulation Flowsheets (Taken 01/14/2023 0934) Pt Will Perform Toileting - Clothing Manipulation and hygiene:  with modified independence  sitting/lateral leans Goal: Pt/Caregiver Will Perform Home Exercise Program Flowsheets (Taken 01/14/2023 [phone]) Pt/caregiver will Perform Home Exercise Program:  Increased ROM  Increased strength  Both right and left upper extremity  Independently  Veora Fonte OT, MOT

## 2023-01-14 NOTE — Plan of Care (Signed)
  Problem: Coping: Goal: Ability to adjust to condition or change in health will improve Outcome: Progressing   Problem: Fluid Volume: Goal: Ability to maintain a balanced intake and output will improve Outcome: Progressing   Problem: Health Behavior/Discharge Planning: Goal: Ability to manage health-related needs will improve Outcome: Progressing   Problem: Nutritional: Goal: Maintenance of adequate nutrition will improve Outcome: Progressing   

## 2023-01-15 DIAGNOSIS — R5381 Other malaise: Secondary | ICD-10-CM | POA: Diagnosis not present

## 2023-01-15 DIAGNOSIS — M1990 Unspecified osteoarthritis, unspecified site: Secondary | ICD-10-CM | POA: Diagnosis not present

## 2023-01-15 DIAGNOSIS — K5900 Constipation, unspecified: Secondary | ICD-10-CM | POA: Diagnosis not present

## 2023-01-15 DIAGNOSIS — R339 Retention of urine, unspecified: Secondary | ICD-10-CM | POA: Diagnosis not present

## 2023-01-15 DIAGNOSIS — E785 Hyperlipidemia, unspecified: Secondary | ICD-10-CM | POA: Diagnosis not present

## 2023-01-15 DIAGNOSIS — R1312 Dysphagia, oropharyngeal phase: Secondary | ICD-10-CM | POA: Diagnosis not present

## 2023-01-15 DIAGNOSIS — W19XXXD Unspecified fall, subsequent encounter: Secondary | ICD-10-CM | POA: Diagnosis not present

## 2023-01-15 DIAGNOSIS — E43 Unspecified severe protein-calorie malnutrition: Secondary | ICD-10-CM | POA: Diagnosis not present

## 2023-01-15 DIAGNOSIS — R7401 Elevation of levels of liver transaminase levels: Secondary | ICD-10-CM | POA: Diagnosis not present

## 2023-01-15 DIAGNOSIS — J9691 Respiratory failure, unspecified with hypoxia: Secondary | ICD-10-CM | POA: Diagnosis not present

## 2023-01-15 DIAGNOSIS — E1169 Type 2 diabetes mellitus with other specified complication: Secondary | ICD-10-CM | POA: Diagnosis not present

## 2023-01-15 DIAGNOSIS — M6282 Rhabdomyolysis: Secondary | ICD-10-CM | POA: Diagnosis not present

## 2023-01-15 DIAGNOSIS — F419 Anxiety disorder, unspecified: Secondary | ICD-10-CM | POA: Diagnosis not present

## 2023-01-15 DIAGNOSIS — R0602 Shortness of breath: Secondary | ICD-10-CM | POA: Diagnosis not present

## 2023-01-15 DIAGNOSIS — J309 Allergic rhinitis, unspecified: Secondary | ICD-10-CM | POA: Diagnosis not present

## 2023-01-15 DIAGNOSIS — J449 Chronic obstructive pulmonary disease, unspecified: Secondary | ICD-10-CM | POA: Diagnosis not present

## 2023-01-15 DIAGNOSIS — R110 Nausea: Secondary | ICD-10-CM | POA: Diagnosis not present

## 2023-01-15 DIAGNOSIS — I610 Nontraumatic intracerebral hemorrhage in hemisphere, subcortical: Secondary | ICD-10-CM | POA: Diagnosis not present

## 2023-01-15 DIAGNOSIS — R41841 Cognitive communication deficit: Secondary | ICD-10-CM | POA: Diagnosis not present

## 2023-01-15 DIAGNOSIS — G8929 Other chronic pain: Secondary | ICD-10-CM | POA: Diagnosis not present

## 2023-01-15 DIAGNOSIS — R2681 Unsteadiness on feet: Secondary | ICD-10-CM | POA: Diagnosis not present

## 2023-01-15 DIAGNOSIS — E1159 Type 2 diabetes mellitus with other circulatory complications: Secondary | ICD-10-CM | POA: Diagnosis not present

## 2023-01-15 DIAGNOSIS — M25569 Pain in unspecified knee: Secondary | ICD-10-CM | POA: Diagnosis not present

## 2023-01-15 DIAGNOSIS — S06339D Contusion and laceration of cerebrum, unspecified, with loss of consciousness of unspecified duration, subsequent encounter: Secondary | ICD-10-CM | POA: Diagnosis not present

## 2023-01-15 DIAGNOSIS — I10 Essential (primary) hypertension: Secondary | ICD-10-CM | POA: Diagnosis not present

## 2023-01-15 DIAGNOSIS — G9341 Metabolic encephalopathy: Secondary | ICD-10-CM | POA: Diagnosis not present

## 2023-01-15 DIAGNOSIS — R601 Generalized edema: Secondary | ICD-10-CM | POA: Diagnosis not present

## 2023-01-15 DIAGNOSIS — F32A Depression, unspecified: Secondary | ICD-10-CM | POA: Diagnosis not present

## 2023-01-15 DIAGNOSIS — E119 Type 2 diabetes mellitus without complications: Secondary | ICD-10-CM | POA: Diagnosis not present

## 2023-01-15 DIAGNOSIS — N390 Urinary tract infection, site not specified: Secondary | ICD-10-CM | POA: Diagnosis not present

## 2023-01-15 DIAGNOSIS — R109 Unspecified abdominal pain: Secondary | ICD-10-CM | POA: Diagnosis not present

## 2023-01-15 DIAGNOSIS — T796XXA Traumatic ischemia of muscle, initial encounter: Secondary | ICD-10-CM | POA: Diagnosis not present

## 2023-01-15 LAB — GLUCOSE, CAPILLARY
Glucose-Capillary: 125 mg/dL — ABNORMAL HIGH (ref 70–99)
Glucose-Capillary: 117 mg/dL — ABNORMAL HIGH (ref 70–99)

## 2023-01-15 LAB — CK: Total CK: 422 U/L — ABNORMAL HIGH (ref 38–234)

## 2023-01-15 MED ORDER — OXYCODONE HCL 5 MG PO TABS: 5.0000 mg | ORAL_TABLET | Freq: Three times a day (TID) | ORAL | 0 refills | Status: DC | PRN

## 2023-01-15 MED ORDER — ALPRAZOLAM 0.25 MG PO TABS: 0.2500 mg | ORAL_TABLET | Freq: Two times a day (BID) | ORAL | 0 refills | Status: DC | PRN

## 2023-01-15 MED ORDER — ENSURE ENLIVE PO LIQD: 237.0000 mL | Freq: Two times a day (BID) | ORAL | Status: DC

## 2023-01-15 MED ORDER — ADULT MULTIVITAMIN W/MINERALS CH: 1.0000 | ORAL_TABLET | Freq: Every day | ORAL | Status: AC

## 2023-01-15 NOTE — Progress Notes (Signed)
Report given to RN at [facility] [employer]

## 2023-01-15 NOTE — Care Management Important Message (Signed)
Important Message  Patient Details  Name: [name] [name] MRN: [id number] Date of Birth: 30-Dec-1931   Important Message Given:        Corey Harold 01/15/2023, 10:33 AM

## 2023-01-15 NOTE — Discharge Summary (Signed)
Physician Discharge Summary  [name] [name] ZOX:[id number] DOB: 1931/10/10 DOA: 01/11/2023  PCP: Joycelyn Rua, MD  Admit date: 01/11/2023 Discharge date: 01/15/2023 Recommendations for Outpatient Follow-up:  Follow up with PCP in 1 weeks-call for appointment Please obtain BMP/CBC in one week  Discharge Dispo: SNF Discharge Condition: Stable Code Status:   Code Status: Full Code Diet recommendation:  Diet Order             Diet Heart Room service appropriate? Yes; Fluid consistency: Thin  Diet effective now                    Brief/Interim Summary:  89 y.o.f w/ anxiety/depression, COPD, diabetes mellitus type 2, hypertension, hyperlipidemia, rhinitis, and more presented to ED with a chief complaint of being found down.  Unfortunately patient does not recall the event at all.  She could not tell why she was brought to the ED, was alert oriented to person place but not to time and had no complaints.  Was having nasal congestion and was recently placed on doxycycline for that by PCP. In the ED: Afebrile vitals are stable not hypoxic. Labs showed leukocytosis 13.8 stable hemoglobin sodium 131 AST 853 CK >4000, CT C-spine shows no acute fracture CT head shows a 5 mm left frontal lobe subcortical petechial bleed X-ray left ankle no acute findings X-ray hips osteoarthritis without acute findings X-ray knee abnormality of the tibial plateau CT knee abnormality of the tibial plateau without acute fracture Chest x-ray shows no active disease EKG shows a heart rate of 68, sinus rhythm, QTc 4 and 74 1 L bolus given in the ED followed by 500 mL bolus. Neurosurgery consulted and admitted,CK in 600 on admisison> it has nicely improved, renal function stable management IV fluids now tolerating diet.  She was treated for UTI completing antibiotics x 3 days, other issues with constipation gaseous sensation that improved with suppository and MiraLAX had chest discomfort with flat troponin no delta  in the setting of demand ischemia. She was managed conservatively and she has improved. She will cont ptot and snf placement    Discharge Diagnoses:  Principal Problem:   Rhabdomyolysis Active Problems:   Brain contusion (HCC)   Depression with anxiety   Type 2 diabetes mellitus without complication, without long-term current use of insulin (HCC)   Hypertension associated with diabetes (HCC)   Hyperlipidemia associated with type 2 diabetes mellitus (HCC)   Fall at home, initial encounter   Transaminitis   UTI (urinary tract infection)   Protein-calorie malnutrition, severe  Rhabdomyolysis w/ ck > 4000: Likely traumatic found down for unknown amount of time continue IV fluid hydration gently Eliphos remained stable CK is nicely improving. Cont PT OT.  Encourage hydration.     Recent Labs  Lab 01/11/23 2205 01/12/23 0546 01/13/23 0425 01/14/23 0435 01/14/23 1239 01/15/23 0417  CKTOTAL 6,646* 4,853* 2,081* 965* 733* 422*    Acute metabolic encephalopathy with confusion on admission. CT head shows a 5 mm left frontal lobe subcortical petechial bleed> Neurosurgery was consulted and recommended nothing further to do, not even repeat imaging, monitoring neurocheck, holding pharmacologic prophylaxis for DVT continue PT OT.  Continue underlying UTI treatment    UTI: Complete antibiotics x 3 days today    Chronic knee pain and arthritis: Cont tylenolol and oxycodone prn  Hypokalemia: Resolved   Constipation Gaseous sensation: Constipation relieved with Dulcolax, continue maalox prn   Epigastric and chest discomfort: EKG nonischemic, serial troponins flat and no delta .Holding  aspirin for now in the setting of patient's subcortical petechial bleed in the CT. Pain resolved with ppi and maalox.   Transaminitis: AST elevated at 153.  Previously normal likely in the setting of rhabdomyolysis continue to monitor. Hepatitis panel negative.   Fall at home, initial encounter: Unknown  etiology/mechanism she was found down, extensive imaging as below - CT C-spine showed no acute findings - CT head shows 5 mm left frontal lobe subcortical petechial bleed - X-ray left ankle has no acute findings - X-ray hip shows osteoarthritis without acute findings - X-ray knee shows an abnormal tibial plateau - CT knee shows an old fracture of the tibial plateau but nothing acute - Chest x-ray shows no active disease - EKG shows a heart rate of 68, QTc 474 Planning for SNF   HLD: On statin.monitor cpk   Hypertension Blood pressure remains on higher side, cont low-dose amlodipine, add as needed hydralazine, BP does fluctuate up to 120s- 190s   T2DM: A1c at 6.4 blood sugar controlled. Continue sliding scale insulin Recent Labs  Lab 01/12/23 0546 01/12/23 1137 01/14/23 0741 01/14/23 1121 01/14/23 1555 01/14/23 2029 01/15/23 0728  GLUCAP  --    < > 146* 226* 198* 168* 125*  HGBA1C 6.4*  --   --   --   --   --   --    < > = values in this interval not displayed.    Anxiety/depression: Continue home Xanax and Lexapro  Consults: Neurosurgery  Subjective: Alert awake, has chronic pain on knee but no complaints  Discharge Exam: Vitals:   01/14/23 2026 01/15/23 0358  BP: (!) 152/57 (!) 154/57  Pulse: 80 73  Resp: 20 17  Temp: 99.2 F (37.3 C) 97.8 F (36.6 C)  SpO2: 96% 95%   General: Pt is alert, awake, not in acute distress Cardiovascular: RRR, S1/S2 +, no rubs, no gallops Respiratory: CTA bilaterally, no wheezing, no rhonchi Abdominal: Soft, NT, ND, bowel sounds + Extremities: no edema, no cyanosis  Discharge Instructions  Discharge Instructions     Discharge instructions   Complete by: As directed    Please call call MD or return to ER for similar or worsening recurring problem that brought you to hospital or if any fever,nausea/vomiting,abdominal pain, uncontrolled pain, chest pain,  shortness of breath or any other alarming symptoms.  Please follow-up  your doctor as instructed in a week time and call the office for appointment.  Please avoid alcohol, smoking, or any other illicit substance and maintain healthy habits including taking your regular medications as prescribed.  You were cared for by a hospitalist during your hospital stay. If you have any questions about your discharge medications or the care you received while you were in the hospital after you are discharged, you can call the unit and ask to speak with the hospitalist on call if the hospitalist that took care of you is not available.  Once you are discharged, your primary care physician will handle any further medical issues. Please note that NO REFILLS for any discharge medications will be authorized once you are discharged, as it is imperative that you return to your primary care physician (or establish a relationship with a primary care physician if you do not have one) for your aftercare needs so that they can reassess your need for medications and monitor your lab values   Increase activity slowly   Complete by: As directed       Allergies as of 01/15/2023  Reactions   Lisinopril Cough, Other (See Comments)   Chlordiazepoxide Hcl Other (See Comments)   Chlordiazepoxide-clidinium    Can't remember   Clarithromycin    Can't remember   Clarithromycin Other (See Comments)   Penicillin G Other (See Comments)   Penicillins    Can't remember   Povidone-iodine Other (See Comments)   Raloxifene Hydrochloride Other (See Comments)   Sertraline Hcl Other (See Comments)   Sulfacetamide Sodium Nausea And Vomiting   Sulfamethoxazole-trimethoprim    Betadine [povidone Iodine] Rash   Erythromycin Itching, Other (See Comments)   Can't remember   Hydrogen Peroxide Rash, Other (See Comments)   Sertraline Hcl Other (See Comments)   Sweating and mouth irritation   Sulfa Antibiotics Nausea And Vomiting, Other (See Comments)        Medication List     TAKE these  medications    acetaminophen 500 MG tablet Commonly known as: TYLENOL Take 500 mg by mouth every 6 (six) hours as needed for moderate pain.   Ventolin HFA 108 (90 Base) MCG/ACT inhaler Generic drug: albuterol Inhale 2 puffs into the lungs every 6 (six) hours as needed for wheezing or shortness of breath. What changed: Another medication with the same name was changed. Make sure you understand how and when to take each.   albuterol (2.5 MG/3ML) 0.083% nebulizer solution Commonly known as: PROVENTIL NEBULIZE 1 VIAL EVERY 6 HOURS AS NEEDED FOR SHORTNESS OF BREATH OR WHEEZING What changed: See the new instructions.   ALPRAZolam 0.25 MG tablet Commonly known as: XANAX Take 1 tablet (0.25 mg total) by mouth 2 (two) times daily as needed for anxiety.   amLODipine 2.5 MG tablet Commonly known as: NORVASC Take 1 tablet (2.5 mg total) by mouth daily.   atenolol 25 MG tablet Commonly known as: TENORMIN Take 25 mg by mouth 2 (two) times daily.   bacitracin ointment Apply 1 Application topically 2 (two) times daily.   blood glucose meter kit and supplies Kit Check BS once daily   budesonide-formoterol 160-4.5 MCG/ACT inhaler Commonly known as: SYMBICORT Inhale 2 puffs into the lungs 2 (two) times daily.   cholecalciferol 1000 units tablet Commonly known as: VITAMIN D Take 2,000 Units by mouth daily.   cloNIDine 0.1 MG tablet Commonly known as: CATAPRES Take 1 tablet (0.1 mg total) by mouth at bedtime as needed.   escitalopram 20 MG tablet Commonly known as: LEXAPRO Take 1 tablet (20 mg total) by mouth daily. as directed   feeding supplement Liqd Take 237 mLs by mouth 2 (two) times daily between meals.   Fish Oil 1000 MG Caps Take 1 capsule by mouth daily.   fluticasone 50 MCG/ACT nasal spray Commonly known as: FLONASE USE ONE SPRAY in each nostril ONCE daily   gabapentin 100 MG capsule Commonly known as: NEURONTIN Take 1 capsule (100 mg total) by mouth at bedtime.    ketoconazole 2 % shampoo Commonly known as: NIZORAL Apply 1 application topically 2 (two) times a week.   losartan 25 MG tablet Commonly known as: COZAAR Take 25 mg by mouth daily.   lovastatin 40 MG tablet Commonly known as: MEVACOR Take 1 tablet (40 mg total) by mouth daily.   Melatonin 5 MG Caps Take 1 capsule (5 mg total) by mouth at bedtime.   metFORMIN 500 MG 24 hr tablet Commonly known as: GLUCOPHAGE-XR 1 tablet once daily What changed:  how much to take how to take this when to take this   multivitamin with minerals Tabs  tablet Take 1 tablet by mouth daily.   neomycin-bacitracin-polymyxin [phone] ointment Apply 1 Application topically 4 (four) times daily.   ONE TOUCH DELICA LANCING DEV Misc Use to check BG up to twice a day. DX: E11.29 - type 2 DM   onetouch ultrasoft lancets CHECK GLUCOSE TWICE DAILY AS NEEDED Dx E11.9   OneTouch Verio Flex System w/Device Kit CHECK GLUCOSE TWICE DAILY AS NEEDED Dx E11.9   OneTouch Verio test strip Generic drug: glucose blood CHECK GLUCOSE TWICE DAILY AS NEEDED Dx E11.9   oxyCODONE 5 MG immediate release tablet Commonly known as: Oxy IR/ROXICODONE Take 1 tablet (5 mg total) by mouth every 8 (eight) hours as needed for up to 2 doses for moderate pain or severe pain.   Super Calcium 1500 (600 Ca) MG Tabs tablet Generic drug: calcium carbonate Take by mouth.   triamcinolone cream 0.1 % Commonly known as: KENALOG Apply 1 application topically 2 (two) times daily.        Follow-up Information     Joycelyn Rua, MD Follow up in 1 week(s).   Specialty: Family Medicine Contact information: [address] [place] [address] [phone]                Allergies  Allergen Reactions   Lisinopril Cough and Other (See Comments)   Chlordiazepoxide Hcl Other (See Comments)   Chlordiazepoxide-Clidinium     Can't remember   Clarithromycin     Can't remember   Clarithromycin Other (See  Comments)   Penicillin G Other (See Comments)   Penicillins     Can't remember   Povidone-Iodine Other (See Comments)   Raloxifene Hydrochloride Other (See Comments)   Sertraline Hcl Other (See Comments)   Sulfacetamide Sodium Nausea And Vomiting   Sulfamethoxazole-Trimethoprim    Betadine [Povidone Iodine] Rash   Erythromycin Itching and Other (See Comments)    Can't remember   Hydrogen Peroxide Rash and Other (See Comments)   Sertraline Hcl Other (See Comments)    Sweating and mouth irritation    Sulfa Antibiotics Nausea And Vomiting and Other (See Comments)    The results of significant diagnostics from this hospitalization (including imaging, microbiology, ancillary and laboratory) are listed below for reference.    Microbiology: Recent Results (from the past 240 hour(s))  Urine Culture (for pregnant, neutropenic or urologic patients or patients with an indwelling urinary catheter)     Status: Abnormal   Collection Time: 01/12/23  3:25 AM   Specimen: Urine, Clean Catch  Result Value Ref Range Status   Specimen Description   Final    URINE, CLEAN CATCH Performed at [facility], [address] [place], [place] [address]    Special Requests   Final    NONE Performed at [facility], [address] [place], [place] [address]    Culture (A)  Final    80,000 COLONIES/mL AEROCOCCUS SPECIES Standardized susceptibility testing for this organism is not available. Performed at [facility], [address] [place], [place] [address]    Report Status 01/13/2023 FINAL  Final    Procedures/Studies: US Venous Img Lower Unilateral Left (DVT)  Result Date: 01/12/2023 CLINICAL DATA:  89 year old female with a history of edema EXAM: LEFT LOWER EXTREMITY VENOUS DOPPLER ULTRASOUND TECHNIQUE: Gray-scale sonography with graded compression, as well as color Doppler and duplex ultrasound were performed to evaluate the lower extremity deep venous systems from the level of the common  femoral vein and including the common femoral, femoral, profunda  femoral, popliteal and calf veins including the posterior tibial, peroneal and gastrocnemius veins when visible. The superficial great saphenous vein was also interrogated. Spectral Doppler was utilized to evaluate flow at rest and with distal augmentation maneuvers in the common femoral, femoral and popliteal veins. COMPARISON:  None Available. FINDINGS: Contralateral Common Femoral Vein: Respiratory phasicity is normal and symmetric with the symptomatic side. No evidence of thrombus. Normal compressibility. Common Femoral Vein: No evidence of thrombus. Normal compressibility, respiratory phasicity and response to augmentation. Saphenofemoral Junction: No evidence of thrombus. Normal compressibility and flow on color Doppler imaging. Profunda Femoral Vein: No evidence of thrombus. Normal compressibility and flow on color Doppler imaging. Femoral Vein: No evidence of thrombus. Normal compressibility, respiratory phasicity and response to augmentation. Popliteal Vein: No evidence of thrombus. Normal compressibility, respiratory phasicity and response to augmentation. Calf Veins: No evidence of thrombus. Normal compressibility and flow on color Doppler imaging. Superficial Great Saphenous Vein: No evidence of thrombus. Normal compressibility and flow on color Doppler imaging. Other Findings:  None. IMPRESSION: Directed duplex of the left lower extremity negative for DVT Signed, Yvone Neu. Miachel Roux, RPVI [facility] Electronically Signed   By: Gilmer Mor D.O.   On: 01/12/2023 10:41   CT Knee Left Wo Contrast  Result Date: 01/12/2023 CLINICAL DATA:  Knee trauma due to a fall. Internal derangement is suspected. Abnormal radiographs. EXAM: CT OF THE LEFT KNEE WITHOUT CONTRAST TECHNIQUE: Multidetector CT imaging of the left knee was performed according to the standard protocol.  Multiplanar CT image reconstructions were also generated. RADIATION DOSE REDUCTION: This exam was performed according to the departmental dose-optimization program which includes automated exposure control, adjustment of the mA and/or kV according to patient size and/or use of iterative reconstruction technique. COMPARISON:  Left knee radiographs 01/11/2023 FINDINGS: Bones/Joint/Cartilage Diffuse bone demineralization. Prominent degenerative changes in all 3 compartments with joint space narrowing, osteophyte formation, and multiple subcortical cysts. Deformity of the lateral tibial plateau appears to be chronic, likely representing old fracture deformity and/or degenerative changes. No acute fractures are demonstrated. There is a large effusion. Ligaments Suboptimally assessed by CT. Muscles and Tendons No intramuscular mass or infiltration demonstrated. Soft tissues No soft tissue swelling or infiltration.  Vascular calcifications. IMPRESSION: 1. No acute fractures are identified. 2. Severe tricompartment degenerative changes. 3. Deformity of the lateral tibial plateau appears old, likely representing old fracture deformity and/or degenerative change. 4. Large left knee effusion. Electronically Signed   By: Burman Nieves M.D.   On: 01/12/2023 02:36   CT Head Wo Contrast  Addendum Date: 01/12/2023   ADDENDUM REPORT: 01/12/2023 01:46 ADDENDUM: Critical Value/emergent results were called by telephone at the time of interpretation on 01/12/2023 at 12:55 am to provider Dr. Bebe Shaggy, who verbally acknowledged these results. Electronically Signed   By: Almira Bar M.D.   On: 01/12/2023 01:46   Result Date: 01/12/2023 CLINICAL DATA:  Blunt polytrauma with recent falls, including both last night and today, with head and neck pain. EXAM: CT HEAD WITHOUT CONTRAST CT CERVICAL SPINE WITHOUT CONTRAST TECHNIQUE: Multidetector CT imaging of the head and cervical spine was performed following the standard protocol  without intravenous contrast. Multiplanar CT image reconstructions of the cervical spine were also generated. RADIATION DOSE REDUCTION: This exam was performed according to the departmental dose-optimization program which includes automated exposure control, adjustment of the mA and/or kV according to patient size and/or use of iterative reconstruction technique. COMPARISON:  CT scan head 04/11/2016. Cervical spine plain  film series 03/02/2017. No prior cervical spine CT. FINDINGS: CT HEAD FINDINGS Brain: There is a 5 mm left frontal lobe subcortical petechial bleed, not well seen on the axial images but is visible on coronal reconstruction series 4, image 30 and sagittal reconstruction series 5, image 40. No extra-axial or other bleeds are seen or associated mass effect. This was not present in 2017. There is mild global atrophy, mild atrophic ventriculomegaly and small-vessel disease. Senescent mineralization again noted both basal ganglia. No cortical based infarct is evident, no mass lesion or mass effect is seen and no midline shift. Basal cisterns are clear. Vascular: There is calcification of the carotid siphons and distal vertebral arteries. There are no hyperdense central vessels. Skull: Negative for fractures or focal lesions. There is no visible scalp hematoma. Sinuses/Orbits: Negative orbits except for lens replacements. Clear paranasal sinuses and mastoids. Mild deviation of the nasal septum to the right. Other: Traumatic Brain Injury Risk Stratification Skull Fracture: No - Low/mBIG 1 Subdural Hematoma (SDH): No - Low Subarachnoid Hemorrhage [facility]): No Epidural Hematoma (EDH): No - Low/mBIG 1 Cerebral contusion, intra-axial, intraparenchymal Hemorrhage (IPH): Yes. 5 mm left frontal lobe subcortical petechial bleed with no other hemorrhage and no mass effect. Intraventricular Hemorrhage (IVH): No - Low/mBIG 1 Midline Shift > 1mm or Edema/effacement of sulci/vents: No - Low/mBIG 1  ---------------------------------------------------- CT CERVICAL SPINE FINDINGS Alignment: Slight cervical dextroscoliosis. No evidence of traumatic listhesis. There is bone-on-bone anterior atlantodental joint space loss with spurring. Skull base and vertebrae: No acute fracture is evident. There is mild osteopenia. No primary bone lesion or focal pathologic process. Left anterolateral bridging enthesopathy is noted C5-T1 and T2-3. Soft tissues and spinal canal: No prevertebral fluid or swelling. No visible canal hematoma. Minimal calcification both carotid bifurcations, very little for age. No thyroid mass. No laryngeal mass. Disc levels: There is moderate disc space loss with bidirectional osteophytes at C5-6 and C6-7, with posterior disc osteophyte complexes partially effacing the anterior CSF at these levels. There is no significant cord mass effect or spinal stenosis. The other discs are normal in heights with small endplate spurring. There are minimal posterior disc osteophyte complexes at the other cervical levels. At C6-7 there is also a small calcified central disc extrusion but it is nonstenosing. Uncinate joint and facet spurring is present multiple levels. Acquired foraminal stenosis is moderate on the right at C3-4, bilaterally mild C4-5, bilaterally mild C5-6. Upper chest: There is biapical pleural-parenchymal scarring and bronchiolectasis. Other: None. IMPRESSION: 1. 5 mm left frontal lobe subcortical petechial bleed with no other acute intracranial CT findings or depressed skull fractures (mBIG 1-2), no associated positive mass effect. 2. Atrophy and small-vessel disease. 3. Osteopenia and degenerative change without evidence of cervical fractures or traumatic listhesis. 4. Biapical pleural-parenchymal scarring and bronchiolectasis. 5. PRA is attempting to reach ordering provider for stat notification at time of signing. This report will be addended when contact has been made. Electronically Signed:  By: Almira Bar M.D. On: 01/12/2023 00:54   CT Cervical Spine Wo Contrast  Addendum Date: 01/12/2023   ADDENDUM REPORT: 01/12/2023 01:46 ADDENDUM: Critical Value/emergent results were called by telephone at the time of interpretation on 01/12/2023 at 12:55 am to provider Dr. Bebe Shaggy, who verbally acknowledged these results. Electronically Signed   By: Almira Bar M.D.   On: 01/12/2023 01:46   Result Date: 01/12/2023 CLINICAL DATA:  Blunt polytrauma with recent falls, including both last night and today, with head and neck pain. EXAM: CT HEAD WITHOUT CONTRAST CT  CERVICAL SPINE WITHOUT CONTRAST TECHNIQUE: Multidetector CT imaging of the head and cervical spine was performed following the standard protocol without intravenous contrast. Multiplanar CT image reconstructions of the cervical spine were also generated. RADIATION DOSE REDUCTION: This exam was performed according to the departmental dose-optimization program which includes automated exposure control, adjustment of the mA and/or kV according to patient size and/or use of iterative reconstruction technique. COMPARISON:  CT scan head 04/11/2016. Cervical spine plain film series 03/02/2017. No prior cervical spine CT. FINDINGS: CT HEAD FINDINGS Brain: There is a 5 mm left frontal lobe subcortical petechial bleed, not well seen on the axial images but is visible on coronal reconstruction series 4, image 30 and sagittal reconstruction series 5, image 40. No extra-axial or other bleeds are seen or associated mass effect. This was not present in 2017. There is mild global atrophy, mild atrophic ventriculomegaly and small-vessel disease. Senescent mineralization again noted both basal ganglia. No cortical based infarct is evident, no mass lesion or mass effect is seen and no midline shift. Basal cisterns are clear. Vascular: There is calcification of the carotid siphons and distal vertebral arteries. There are no hyperdense central vessels. Skull: Negative  for fractures or focal lesions. There is no visible scalp hematoma. Sinuses/Orbits: Negative orbits except for lens replacements. Clear paranasal sinuses and mastoids. Mild deviation of the nasal septum to the right. Other: Traumatic Brain Injury Risk Stratification Skull Fracture: No - Low/mBIG 1 Subdural Hematoma (SDH): No - Low Subarachnoid Hemorrhage [facility]): No Epidural Hematoma (EDH): No - Low/mBIG 1 Cerebral contusion, intra-axial, intraparenchymal Hemorrhage (IPH): Yes. 5 mm left frontal lobe subcortical petechial bleed with no other hemorrhage and no mass effect. Intraventricular Hemorrhage (IVH): No - Low/mBIG 1 Midline Shift > 1mm or Edema/effacement of sulci/vents: No - Low/mBIG 1 ---------------------------------------------------- CT CERVICAL SPINE FINDINGS Alignment: Slight cervical dextroscoliosis. No evidence of traumatic listhesis. There is bone-on-bone anterior atlantodental joint space loss with spurring. Skull base and vertebrae: No acute fracture is evident. There is mild osteopenia. No primary bone lesion or focal pathologic process. Left anterolateral bridging enthesopathy is noted C5-T1 and T2-3. Soft tissues and spinal canal: No prevertebral fluid or swelling. No visible canal hematoma. Minimal calcification both carotid bifurcations, very little for age. No thyroid mass. No laryngeal mass. Disc levels: There is moderate disc space loss with bidirectional osteophytes at C5-6 and C6-7, with posterior disc osteophyte complexes partially effacing the anterior CSF at these levels. There is no significant cord mass effect or spinal stenosis. The other discs are normal in heights with small endplate spurring. There are minimal posterior disc osteophyte complexes at the other cervical levels. At C6-7 there is also a small calcified central disc extrusion but it is nonstenosing. Uncinate joint and facet spurring is present multiple levels. Acquired foraminal stenosis is moderate on the right at C3-4,  bilaterally mild C4-5, bilaterally mild C5-6. Upper chest: There is biapical pleural-parenchymal scarring and bronchiolectasis. Other: None. IMPRESSION: 1. 5 mm left frontal lobe subcortical petechial bleed with no other acute intracranial CT findings or depressed skull fractures (mBIG 1-2), no associated positive mass effect. 2. Atrophy and small-vessel disease. 3. Osteopenia and degenerative change without evidence of cervical fractures or traumatic listhesis. 4. Biapical pleural-parenchymal scarring and bronchiolectasis. 5. PRA is attempting to reach ordering provider for stat notification at time of signing. This report will be addended when contact has been made. Electronically Signed: By: Almira Bar M.D. On: 01/12/2023 00:54   DG Hips Bilat W or Wo Pelvis 3-4 Views  Result Date: 01/12/2023 CLINICAL DATA:  Fall EXAM: DG HIP (WITH OR WITHOUT PELVIS) 3-4V BILAT COMPARISON:  02/27/2020 FINDINGS: Severe osteoarthritic changes in the right hip with complete joint space loss, sclerosis, osteophyte formation, and flattening of the femoral head. Changes of prior left hip replacement. No hardware complicating feature. No acute fracture, subluxation or dislocation. IMPRESSION: Prior left hip replacement. Severe osteoarthritis within the right hip. No acute bony abnormality. Electronically Signed   By: Charlett Nose M.D.   On: 01/12/2023 01:06   DG Ankle Complete Left  Result Date: 01/12/2023 CLINICAL DATA:  Fall EXAM: LEFT ANKLE COMPLETE - 3+ VIEW COMPARISON:  None Available. FINDINGS: No acute bony abnormality. Specifically, no fracture, subluxation, or dislocation. Plantar calcaneal spur. Soft tissues are intact. IMPRESSION: No acute bony abnormality. Electronically Signed   By: Charlett Nose M.D.   On: 01/12/2023 01:04   DG Knee Complete 4 Views Left  Result Date: 01/12/2023 CLINICAL DATA:  Fall EXAM: LEFT KNEE - COMPLETE 4+ VIEW COMPARISON:  None Available. FINDINGS: Depressed appearance of the lateral  tibial plateau with associated sclerosis in the lateral tibial plateau and the lateral femoral condyle. I favor this is degenerative in nature, but given the depressed appearance, this could be further evaluated with CT. Moderate joint effusion. Degenerative changes throughout the left knee with joint space narrowing and spurring. No subluxation or dislocation. IMPRESSION: Depressed appearance of the lateral tibial plateau. While there is associated sclerosis within the lateral tibial plateau and the lateral femoral condyle suggesting degenerative changes, given the depressed appearance, recommend further evaluation with CT. Moderate joint effusion. Electronically Signed   By: Charlett Nose M.D.   On: 01/12/2023 01:03   DG Knee Complete 4 Views Right  Result Date: 01/12/2023 CLINICAL DATA:  Fall EXAM: RIGHT KNEE - COMPLETE 4+ VIEW COMPARISON:  12/16/2021 FINDINGS: No acute bony abnormality. Specifically, no fracture, subluxation, or dislocation. No joint effusion. Slight joint space narrowing in the medial compartment. Soft tissues intact. IMPRESSION: No acute bony abnormality. Electronically Signed   By: Charlett Nose M.D.   On: 01/12/2023 01:01   DG Chest Port 1 View  Result Date: 01/11/2023 CLINICAL DATA:  Fall. EXAM: PORTABLE CHEST 1 VIEW COMPARISON:  Chest radiograph dated 10/09/2021. FINDINGS: There is background of emphysema and mild chronic interstitial coarsening. No focal consolidation, pleural effusion, or pneumothorax. The cardiac silhouette is within limits. Atherosclerotic calcification of the aorta. Degenerative changes of spine. No acute osseous pathology IMPRESSION: No active disease. Electronically Signed   By: Elgie Collard M.D.   On: 01/11/2023 23:34    Labs: BNP (last 3 results) No results for input(s): "BNP" in the last 8760 hours. Basic Metabolic Panel: Recent Labs  Lab 01/11/23 2205 01/12/23 0546 01/13/23 0425 01/14/23 0435  NA 131* 132* 133* 133*  K 4.6 3.9 3.0* 3.5   CL 96* 100 102 101  CO2 25 23 24 24   GLUCOSE 132* 112* 109* 123*  BUN 25* 23 13 7*  CREATININE 0.79 0.63 0.45 0.42*  CALCIUM 9.4 8.5* 7.8* 8.2*  MG  --  2.0 1.7 1.8   Liver Function Tests: Recent Labs  Lab 01/11/23 2205 01/12/23 0546  AST 153* 139*  ALT 41 41  ALKPHOS 52 49  BILITOT 1.5* 1.0  PROT 7.0 6.2*  ALBUMIN 4.1 3.6   No results for input(s): "LIPASE", "AMYLASE" in the last 168 hours. Recent Labs  Lab 01/12/23 0546  AMMONIA <10   CBC: Recent Labs  Lab 01/11/23 2205 01/12/23 0546 01/13/23 0425  01/14/23 0435  WBC 13.8* 10.8* 9.2 9.2  NEUTROABS 11.1* 8.6* 6.8 6.5  HGB 14.1 13.5 12.0 12.9  HCT 40.9 41.4 36.7 40.0  MCV 94.0 96.7 96.6 96.9  PLT 286 246 249 264   Cardiac Enzymes: Recent Labs  Lab 01/12/23 0546 01/13/23 0425 01/14/23 0435 01/14/23 1239 01/15/23 0417  CKTOTAL 4,853* 2,081* 965* 733* 422*   BNP: Invalid input(s): "POCBNP" CBG: Recent Labs  Lab 01/14/23 0741 01/14/23 1121 01/14/23 1555 01/14/23 2029 01/15/23 0728  GLUCAP 146* 226* 198* 168* 125*   No results for input(s): "VITAMINB12", "FOLATE", "FERRITIN", "TIBC", "IRON", "RETICCTPCT" in the last 72 hours. Urinalysis    Component Value Date/Time   COLORURINE AMBER (A) 01/12/2023 0325   APPEARANCEUR CLOUDY (A) 01/12/2023 0325   APPEARANCEUR Cloudy (A) 04/03/2019 0859   LABSPEC 1.018 01/12/2023 0325   PHURINE 6.0 01/12/2023 0325   GLUCOSEU NEGATIVE 01/12/2023 0325   HGBUR MODERATE (A) 01/12/2023 0325   BILIRUBINUR NEGATIVE 01/12/2023 0325   BILIRUBINUR Negative 04/03/2019 0859   KETONESUR 20 (A) 01/12/2023 0325   PROTEINUR 100 (A) 01/12/2023 0325   UROBILINOGEN negative 05/01/2015 1520   UROBILINOGEN 0.2 04/13/2010 1200   NITRITE NEGATIVE 01/12/2023 0325   LEUKOCYTESUR NEGATIVE 01/12/2023 0325   Sepsis Labs Recent Labs  Lab 01/11/23 2205 01/12/23 0546 01/13/23 0425 01/14/23 0435  WBC 13.8* 10.8* 9.2 9.2   Microbiology Recent Results (from the past 240 hour(s))   Urine Culture (for pregnant, neutropenic or urologic patients or patients with an indwelling urinary catheter)     Status: Abnormal   Collection Time: 01/12/23  3:25 AM   Specimen: Urine, Clean Catch  Result Value Ref Range Status   Specimen Description   Final    URINE, CLEAN CATCH Performed at [facility], [address], [place], [place] [address]    Special Requests   Final    NONE Performed at [facility], [address], [place], [place] [address]    Culture (A)  Final    80,000 COLONIES/mL AEROCOCCUS SPECIES Standardized susceptibility testing for this organism is not available. Performed at [facility], [address], [place], [place] [address]    Report Status 01/13/2023 FINAL  Final   Time coordinating discharge: 35 minutes  SIGNED: Lanae Boast, MD  [facility] 01/15/2023, 9:53 AM  If 7PM-7AM, please contact night-coverage [email or web address]

## 2023-01-15 NOTE — TOC Transition Note (Signed)
Transition of Care [facility]) - CM/SW Discharge Note   Patient Details  Name: [name] [name] MRN: [id number] Date of Birth: 11-17-1931  Transition of Care [facility]) CM/SW Contact:  Erin Sons, LCSW Phone Number: 01/15/2023, 11:03 AM   Clinical Narrative:     Per MD patient ready for DC to [facility]. RN, patient, patient's family, and facility notified of DC. Discharge Summary and FL2 sent to facility. RN to call report prior to discharge [phone]. ). DC packet on chart. Daughter [name] will transport pt at DC.   CSW will sign off for now as social work intervention is no longer needed. Please consult [place] again if new needs arise.   Final next level of care: Skilled Nursing Facility Barriers to Discharge: No Barriers Identified   Patient Goals and CMS Choice CMS Medicare.gov Compare Post Acute Care list provided to:: Patient Choice offered to / list presented to : Patient  Discharge Placement                Patient chooses bed at: [facility] Patient to be transferred to facility by: [name] Name of family member notified: Daughter [name] Patient and family notified of of transfer: 01/15/23  Discharge Plan and Services Additional resources added to the After Visit Summary for                                       Social Determinants of Health (SDOH) Interventions SDOH Screenings   Food Insecurity: No Food Insecurity (01/12/2023)  Housing: Low Risk  (01/12/2023)  Transportation Needs: No Transportation Needs (01/12/2023)  Utilities: Not At Risk (01/12/2023)  Depression (PHQ2-9): Medium Risk (04/21/2019)  Financial Resource Strain: Low Risk  (12/08/2017)  Physical Activity: Inactive (12/12/2018)  Social Connections: Moderately Integrated (12/08/2017)  Stress: No Stress Concern Present (12/08/2017)  Tobacco Use: Low Risk  (01/06/2022)   Received from [facility]     Readmission Risk Interventions    01/12/2023    3:12 PM  Readmission Risk Prevention  Plan  Post Dischage Appt Not Complete  Medication Screening Complete  Transportation Screening Complete

## 2023-01-20 DIAGNOSIS — M6282 Rhabdomyolysis: Secondary | ICD-10-CM | POA: Diagnosis not present

## 2023-01-20 DIAGNOSIS — N390 Urinary tract infection, site not specified: Secondary | ICD-10-CM | POA: Diagnosis not present

## 2023-01-20 DIAGNOSIS — R5381 Other malaise: Secondary | ICD-10-CM | POA: Diagnosis not present

## 2023-01-20 DIAGNOSIS — M1990 Unspecified osteoarthritis, unspecified site: Secondary | ICD-10-CM | POA: Diagnosis not present

## 2023-01-25 DIAGNOSIS — I10 Essential (primary) hypertension: Secondary | ICD-10-CM | POA: Diagnosis not present

## 2023-01-25 DIAGNOSIS — R110 Nausea: Secondary | ICD-10-CM | POA: Diagnosis not present

## 2023-01-25 DIAGNOSIS — R5381 Other malaise: Secondary | ICD-10-CM | POA: Diagnosis not present

## 2023-01-25 DIAGNOSIS — R339 Retention of urine, unspecified: Secondary | ICD-10-CM | POA: Diagnosis not present

## 2023-01-28 DIAGNOSIS — E119 Type 2 diabetes mellitus without complications: Secondary | ICD-10-CM | POA: Diagnosis not present

## 2023-01-28 DIAGNOSIS — J9691 Respiratory failure, unspecified with hypoxia: Secondary | ICD-10-CM | POA: Diagnosis not present

## 2023-01-28 DIAGNOSIS — M1990 Unspecified osteoarthritis, unspecified site: Secondary | ICD-10-CM | POA: Diagnosis not present

## 2023-01-28 DIAGNOSIS — J449 Chronic obstructive pulmonary disease, unspecified: Secondary | ICD-10-CM | POA: Diagnosis not present

## 2023-02-01 DIAGNOSIS — R339 Retention of urine, unspecified: Secondary | ICD-10-CM | POA: Diagnosis not present

## 2023-02-01 DIAGNOSIS — I10 Essential (primary) hypertension: Secondary | ICD-10-CM | POA: Diagnosis not present

## 2023-02-01 DIAGNOSIS — J309 Allergic rhinitis, unspecified: Secondary | ICD-10-CM | POA: Diagnosis not present

## 2023-02-01 DIAGNOSIS — J449 Chronic obstructive pulmonary disease, unspecified: Secondary | ICD-10-CM | POA: Diagnosis not present

## 2023-02-08 DIAGNOSIS — J449 Chronic obstructive pulmonary disease, unspecified: Secondary | ICD-10-CM | POA: Diagnosis not present

## 2023-02-08 DIAGNOSIS — I10 Essential (primary) hypertension: Secondary | ICD-10-CM | POA: Diagnosis not present

## 2023-02-08 DIAGNOSIS — R5381 Other malaise: Secondary | ICD-10-CM | POA: Diagnosis not present

## 2023-02-08 DIAGNOSIS — R339 Retention of urine, unspecified: Secondary | ICD-10-CM | POA: Diagnosis not present

## 2023-02-10 DIAGNOSIS — J449 Chronic obstructive pulmonary disease, unspecified: Secondary | ICD-10-CM | POA: Diagnosis not present

## 2023-02-10 DIAGNOSIS — G9341 Metabolic encephalopathy: Secondary | ICD-10-CM | POA: Diagnosis not present

## 2023-02-10 DIAGNOSIS — R54 Age-related physical debility: Secondary | ICD-10-CM | POA: Diagnosis not present

## 2023-02-10 DIAGNOSIS — R1312 Dysphagia, oropharyngeal phase: Secondary | ICD-10-CM | POA: Diagnosis not present

## 2023-02-13 DIAGNOSIS — J449 Chronic obstructive pulmonary disease, unspecified: Secondary | ICD-10-CM | POA: Diagnosis not present

## 2023-02-13 DIAGNOSIS — I610 Nontraumatic intracerebral hemorrhage in hemisphere, subcortical: Secondary | ICD-10-CM | POA: Diagnosis not present

## 2023-02-13 DIAGNOSIS — I10 Essential (primary) hypertension: Secondary | ICD-10-CM | POA: Diagnosis not present

## 2023-02-13 DIAGNOSIS — F419 Anxiety disorder, unspecified: Secondary | ICD-10-CM | POA: Diagnosis not present

## 2023-02-13 DIAGNOSIS — F32A Depression, unspecified: Secondary | ICD-10-CM | POA: Diagnosis not present

## 2023-02-13 DIAGNOSIS — J9691 Respiratory failure, unspecified with hypoxia: Secondary | ICD-10-CM | POA: Diagnosis not present

## 2023-02-13 DIAGNOSIS — Z7984 Long term (current) use of oral hypoglycemic drugs: Secondary | ICD-10-CM | POA: Diagnosis not present

## 2023-02-13 DIAGNOSIS — E559 Vitamin D deficiency, unspecified: Secondary | ICD-10-CM | POA: Diagnosis not present

## 2023-02-13 DIAGNOSIS — M1611 Unilateral primary osteoarthritis, right hip: Secondary | ICD-10-CM | POA: Diagnosis not present

## 2023-02-13 DIAGNOSIS — K5900 Constipation, unspecified: Secondary | ICD-10-CM | POA: Diagnosis not present

## 2023-02-13 DIAGNOSIS — Z96642 Presence of left artificial hip joint: Secondary | ICD-10-CM | POA: Diagnosis not present

## 2023-02-13 DIAGNOSIS — E119 Type 2 diabetes mellitus without complications: Secondary | ICD-10-CM | POA: Diagnosis not present

## 2023-02-13 DIAGNOSIS — R339 Retention of urine, unspecified: Secondary | ICD-10-CM | POA: Diagnosis not present

## 2023-02-13 DIAGNOSIS — Z9181 History of falling: Secondary | ICD-10-CM | POA: Diagnosis not present

## 2023-02-13 DIAGNOSIS — J309 Allergic rhinitis, unspecified: Secondary | ICD-10-CM | POA: Diagnosis not present

## 2023-02-13 DIAGNOSIS — Z87440 Personal history of urinary (tract) infections: Secondary | ICD-10-CM | POA: Diagnosis not present

## 2023-02-13 DIAGNOSIS — M1711 Unilateral primary osteoarthritis, right knee: Secondary | ICD-10-CM | POA: Diagnosis not present

## 2023-02-13 DIAGNOSIS — E7800 Pure hypercholesterolemia, unspecified: Secondary | ICD-10-CM | POA: Diagnosis not present

## 2023-02-13 DIAGNOSIS — G4700 Insomnia, unspecified: Secondary | ICD-10-CM | POA: Diagnosis not present

## 2023-02-13 DIAGNOSIS — Z7951 Long term (current) use of inhaled steroids: Secondary | ICD-10-CM | POA: Diagnosis not present

## 2023-02-13 DIAGNOSIS — G9341 Metabolic encephalopathy: Secondary | ICD-10-CM | POA: Diagnosis not present

## 2023-02-13 DIAGNOSIS — M6282 Rhabdomyolysis: Secondary | ICD-10-CM | POA: Diagnosis not present

## 2023-02-15 DIAGNOSIS — G4700 Insomnia, unspecified: Secondary | ICD-10-CM | POA: Diagnosis not present

## 2023-02-15 DIAGNOSIS — E559 Vitamin D deficiency, unspecified: Secondary | ICD-10-CM | POA: Diagnosis not present

## 2023-02-15 DIAGNOSIS — M1611 Unilateral primary osteoarthritis, right hip: Secondary | ICD-10-CM | POA: Diagnosis not present

## 2023-02-15 DIAGNOSIS — M1711 Unilateral primary osteoarthritis, right knee: Secondary | ICD-10-CM | POA: Diagnosis not present

## 2023-02-15 DIAGNOSIS — F32A Depression, unspecified: Secondary | ICD-10-CM | POA: Diagnosis not present

## 2023-02-15 DIAGNOSIS — M6282 Rhabdomyolysis: Secondary | ICD-10-CM | POA: Diagnosis not present

## 2023-02-15 DIAGNOSIS — F419 Anxiety disorder, unspecified: Secondary | ICD-10-CM | POA: Diagnosis not present

## 2023-02-15 DIAGNOSIS — E119 Type 2 diabetes mellitus without complications: Secondary | ICD-10-CM | POA: Diagnosis not present

## 2023-02-15 DIAGNOSIS — E7800 Pure hypercholesterolemia, unspecified: Secondary | ICD-10-CM | POA: Diagnosis not present

## 2023-02-15 DIAGNOSIS — K5900 Constipation, unspecified: Secondary | ICD-10-CM | POA: Diagnosis not present

## 2023-02-15 DIAGNOSIS — J9691 Respiratory failure, unspecified with hypoxia: Secondary | ICD-10-CM | POA: Diagnosis not present

## 2023-02-15 DIAGNOSIS — I10 Essential (primary) hypertension: Secondary | ICD-10-CM | POA: Diagnosis not present

## 2023-02-15 DIAGNOSIS — G9341 Metabolic encephalopathy: Secondary | ICD-10-CM | POA: Diagnosis not present

## 2023-02-15 DIAGNOSIS — J309 Allergic rhinitis, unspecified: Secondary | ICD-10-CM | POA: Diagnosis not present

## 2023-02-15 DIAGNOSIS — J449 Chronic obstructive pulmonary disease, unspecified: Secondary | ICD-10-CM | POA: Diagnosis not present

## 2023-02-15 DIAGNOSIS — I610 Nontraumatic intracerebral hemorrhage in hemisphere, subcortical: Secondary | ICD-10-CM | POA: Diagnosis not present

## 2023-02-16 DIAGNOSIS — J309 Allergic rhinitis, unspecified: Secondary | ICD-10-CM | POA: Diagnosis not present

## 2023-02-16 DIAGNOSIS — E7800 Pure hypercholesterolemia, unspecified: Secondary | ICD-10-CM | POA: Diagnosis not present

## 2023-02-16 DIAGNOSIS — E559 Vitamin D deficiency, unspecified: Secondary | ICD-10-CM | POA: Diagnosis not present

## 2023-02-16 DIAGNOSIS — J9691 Respiratory failure, unspecified with hypoxia: Secondary | ICD-10-CM | POA: Diagnosis not present

## 2023-02-16 DIAGNOSIS — G4700 Insomnia, unspecified: Secondary | ICD-10-CM | POA: Diagnosis not present

## 2023-02-16 DIAGNOSIS — M1711 Unilateral primary osteoarthritis, right knee: Secondary | ICD-10-CM | POA: Diagnosis not present

## 2023-02-16 DIAGNOSIS — M6282 Rhabdomyolysis: Secondary | ICD-10-CM | POA: Diagnosis not present

## 2023-02-16 DIAGNOSIS — F419 Anxiety disorder, unspecified: Secondary | ICD-10-CM | POA: Diagnosis not present

## 2023-02-16 DIAGNOSIS — M1611 Unilateral primary osteoarthritis, right hip: Secondary | ICD-10-CM | POA: Diagnosis not present

## 2023-02-16 DIAGNOSIS — J449 Chronic obstructive pulmonary disease, unspecified: Secondary | ICD-10-CM | POA: Diagnosis not present

## 2023-02-16 DIAGNOSIS — E119 Type 2 diabetes mellitus without complications: Secondary | ICD-10-CM | POA: Diagnosis not present

## 2023-02-16 DIAGNOSIS — F32A Depression, unspecified: Secondary | ICD-10-CM | POA: Diagnosis not present

## 2023-02-16 DIAGNOSIS — K5900 Constipation, unspecified: Secondary | ICD-10-CM | POA: Diagnosis not present

## 2023-02-16 DIAGNOSIS — I10 Essential (primary) hypertension: Secondary | ICD-10-CM | POA: Diagnosis not present

## 2023-02-16 DIAGNOSIS — G9341 Metabolic encephalopathy: Secondary | ICD-10-CM | POA: Diagnosis not present

## 2023-02-16 DIAGNOSIS — I610 Nontraumatic intracerebral hemorrhage in hemisphere, subcortical: Secondary | ICD-10-CM | POA: Diagnosis not present

## 2023-02-19 DIAGNOSIS — I10 Essential (primary) hypertension: Secondary | ICD-10-CM | POA: Diagnosis not present

## 2023-02-19 DIAGNOSIS — G9341 Metabolic encephalopathy: Secondary | ICD-10-CM | POA: Diagnosis not present

## 2023-02-19 DIAGNOSIS — I610 Nontraumatic intracerebral hemorrhage in hemisphere, subcortical: Secondary | ICD-10-CM | POA: Diagnosis not present

## 2023-02-19 DIAGNOSIS — J449 Chronic obstructive pulmonary disease, unspecified: Secondary | ICD-10-CM | POA: Diagnosis not present

## 2023-02-19 DIAGNOSIS — J9691 Respiratory failure, unspecified with hypoxia: Secondary | ICD-10-CM | POA: Diagnosis not present

## 2023-02-19 DIAGNOSIS — E119 Type 2 diabetes mellitus without complications: Secondary | ICD-10-CM | POA: Diagnosis not present

## 2023-02-19 DIAGNOSIS — J309 Allergic rhinitis, unspecified: Secondary | ICD-10-CM | POA: Diagnosis not present

## 2023-02-19 DIAGNOSIS — F419 Anxiety disorder, unspecified: Secondary | ICD-10-CM | POA: Diagnosis not present

## 2023-02-19 DIAGNOSIS — E559 Vitamin D deficiency, unspecified: Secondary | ICD-10-CM | POA: Diagnosis not present

## 2023-02-19 DIAGNOSIS — G4700 Insomnia, unspecified: Secondary | ICD-10-CM | POA: Diagnosis not present

## 2023-02-19 DIAGNOSIS — M1711 Unilateral primary osteoarthritis, right knee: Secondary | ICD-10-CM | POA: Diagnosis not present

## 2023-02-19 DIAGNOSIS — K5900 Constipation, unspecified: Secondary | ICD-10-CM | POA: Diagnosis not present

## 2023-02-19 DIAGNOSIS — F32A Depression, unspecified: Secondary | ICD-10-CM | POA: Diagnosis not present

## 2023-02-19 DIAGNOSIS — E7800 Pure hypercholesterolemia, unspecified: Secondary | ICD-10-CM | POA: Diagnosis not present

## 2023-02-19 DIAGNOSIS — M6282 Rhabdomyolysis: Secondary | ICD-10-CM | POA: Diagnosis not present

## 2023-02-19 DIAGNOSIS — M1611 Unilateral primary osteoarthritis, right hip: Secondary | ICD-10-CM | POA: Diagnosis not present

## 2023-02-22 DIAGNOSIS — F32A Depression, unspecified: Secondary | ICD-10-CM | POA: Diagnosis not present

## 2023-02-22 DIAGNOSIS — J309 Allergic rhinitis, unspecified: Secondary | ICD-10-CM | POA: Diagnosis not present

## 2023-02-22 DIAGNOSIS — J449 Chronic obstructive pulmonary disease, unspecified: Secondary | ICD-10-CM | POA: Diagnosis not present

## 2023-02-22 DIAGNOSIS — K5900 Constipation, unspecified: Secondary | ICD-10-CM | POA: Diagnosis not present

## 2023-02-22 DIAGNOSIS — E1142 Type 2 diabetes mellitus with diabetic polyneuropathy: Secondary | ICD-10-CM | POA: Diagnosis not present

## 2023-02-22 DIAGNOSIS — M6282 Rhabdomyolysis: Secondary | ICD-10-CM | POA: Diagnosis not present

## 2023-02-22 DIAGNOSIS — G9341 Metabolic encephalopathy: Secondary | ICD-10-CM | POA: Diagnosis not present

## 2023-02-22 DIAGNOSIS — I610 Nontraumatic intracerebral hemorrhage in hemisphere, subcortical: Secondary | ICD-10-CM | POA: Diagnosis not present

## 2023-02-22 DIAGNOSIS — Z0000 Encounter for general adult medical examination without abnormal findings: Secondary | ICD-10-CM | POA: Diagnosis not present

## 2023-02-22 DIAGNOSIS — M1711 Unilateral primary osteoarthritis, right knee: Secondary | ICD-10-CM | POA: Diagnosis not present

## 2023-02-22 DIAGNOSIS — M1611 Unilateral primary osteoarthritis, right hip: Secondary | ICD-10-CM | POA: Diagnosis not present

## 2023-02-22 DIAGNOSIS — E119 Type 2 diabetes mellitus without complications: Secondary | ICD-10-CM | POA: Diagnosis not present

## 2023-02-22 DIAGNOSIS — R54 Age-related physical debility: Secondary | ICD-10-CM | POA: Diagnosis not present

## 2023-02-22 DIAGNOSIS — G4700 Insomnia, unspecified: Secondary | ICD-10-CM | POA: Diagnosis not present

## 2023-02-22 DIAGNOSIS — F419 Anxiety disorder, unspecified: Secondary | ICD-10-CM | POA: Diagnosis not present

## 2023-02-22 DIAGNOSIS — E7800 Pure hypercholesterolemia, unspecified: Secondary | ICD-10-CM | POA: Diagnosis not present

## 2023-02-22 DIAGNOSIS — E559 Vitamin D deficiency, unspecified: Secondary | ICD-10-CM | POA: Diagnosis not present

## 2023-02-22 DIAGNOSIS — J9691 Respiratory failure, unspecified with hypoxia: Secondary | ICD-10-CM | POA: Diagnosis not present

## 2023-02-22 DIAGNOSIS — I10 Essential (primary) hypertension: Secondary | ICD-10-CM | POA: Diagnosis not present

## 2023-02-24 DIAGNOSIS — J449 Chronic obstructive pulmonary disease, unspecified: Secondary | ICD-10-CM | POA: Diagnosis not present

## 2023-02-24 DIAGNOSIS — M6282 Rhabdomyolysis: Secondary | ICD-10-CM | POA: Diagnosis not present

## 2023-02-24 DIAGNOSIS — F419 Anxiety disorder, unspecified: Secondary | ICD-10-CM | POA: Diagnosis not present

## 2023-02-24 DIAGNOSIS — E119 Type 2 diabetes mellitus without complications: Secondary | ICD-10-CM | POA: Diagnosis not present

## 2023-02-24 DIAGNOSIS — M1711 Unilateral primary osteoarthritis, right knee: Secondary | ICD-10-CM | POA: Diagnosis not present

## 2023-02-24 DIAGNOSIS — E559 Vitamin D deficiency, unspecified: Secondary | ICD-10-CM | POA: Diagnosis not present

## 2023-02-24 DIAGNOSIS — E7800 Pure hypercholesterolemia, unspecified: Secondary | ICD-10-CM | POA: Diagnosis not present

## 2023-02-24 DIAGNOSIS — M1611 Unilateral primary osteoarthritis, right hip: Secondary | ICD-10-CM | POA: Diagnosis not present

## 2023-02-24 DIAGNOSIS — I610 Nontraumatic intracerebral hemorrhage in hemisphere, subcortical: Secondary | ICD-10-CM | POA: Diagnosis not present

## 2023-02-24 DIAGNOSIS — J309 Allergic rhinitis, unspecified: Secondary | ICD-10-CM | POA: Diagnosis not present

## 2023-02-24 DIAGNOSIS — J9691 Respiratory failure, unspecified with hypoxia: Secondary | ICD-10-CM | POA: Diagnosis not present

## 2023-02-24 DIAGNOSIS — G9341 Metabolic encephalopathy: Secondary | ICD-10-CM | POA: Diagnosis not present

## 2023-02-24 DIAGNOSIS — G4700 Insomnia, unspecified: Secondary | ICD-10-CM | POA: Diagnosis not present

## 2023-02-24 DIAGNOSIS — K5900 Constipation, unspecified: Secondary | ICD-10-CM | POA: Diagnosis not present

## 2023-02-24 DIAGNOSIS — I10 Essential (primary) hypertension: Secondary | ICD-10-CM | POA: Diagnosis not present

## 2023-02-24 DIAGNOSIS — F32A Depression, unspecified: Secondary | ICD-10-CM | POA: Diagnosis not present

## 2023-02-25 DIAGNOSIS — I610 Nontraumatic intracerebral hemorrhage in hemisphere, subcortical: Secondary | ICD-10-CM | POA: Diagnosis not present

## 2023-02-25 DIAGNOSIS — E559 Vitamin D deficiency, unspecified: Secondary | ICD-10-CM | POA: Diagnosis not present

## 2023-02-25 DIAGNOSIS — F32A Depression, unspecified: Secondary | ICD-10-CM | POA: Diagnosis not present

## 2023-02-25 DIAGNOSIS — G9341 Metabolic encephalopathy: Secondary | ICD-10-CM | POA: Diagnosis not present

## 2023-02-25 DIAGNOSIS — M6282 Rhabdomyolysis: Secondary | ICD-10-CM | POA: Diagnosis not present

## 2023-02-25 DIAGNOSIS — M1611 Unilateral primary osteoarthritis, right hip: Secondary | ICD-10-CM | POA: Diagnosis not present

## 2023-02-25 DIAGNOSIS — G4700 Insomnia, unspecified: Secondary | ICD-10-CM | POA: Diagnosis not present

## 2023-02-25 DIAGNOSIS — I10 Essential (primary) hypertension: Secondary | ICD-10-CM | POA: Diagnosis not present

## 2023-02-25 DIAGNOSIS — E119 Type 2 diabetes mellitus without complications: Secondary | ICD-10-CM | POA: Diagnosis not present

## 2023-02-25 DIAGNOSIS — J309 Allergic rhinitis, unspecified: Secondary | ICD-10-CM | POA: Diagnosis not present

## 2023-02-25 DIAGNOSIS — E7800 Pure hypercholesterolemia, unspecified: Secondary | ICD-10-CM | POA: Diagnosis not present

## 2023-02-25 DIAGNOSIS — M1711 Unilateral primary osteoarthritis, right knee: Secondary | ICD-10-CM | POA: Diagnosis not present

## 2023-02-25 DIAGNOSIS — J449 Chronic obstructive pulmonary disease, unspecified: Secondary | ICD-10-CM | POA: Diagnosis not present

## 2023-02-25 DIAGNOSIS — K5900 Constipation, unspecified: Secondary | ICD-10-CM | POA: Diagnosis not present

## 2023-02-25 DIAGNOSIS — F419 Anxiety disorder, unspecified: Secondary | ICD-10-CM | POA: Diagnosis not present

## 2023-02-25 DIAGNOSIS — J9691 Respiratory failure, unspecified with hypoxia: Secondary | ICD-10-CM | POA: Diagnosis not present

## 2023-02-26 DIAGNOSIS — I10 Essential (primary) hypertension: Secondary | ICD-10-CM | POA: Diagnosis not present

## 2023-02-26 DIAGNOSIS — M6282 Rhabdomyolysis: Secondary | ICD-10-CM | POA: Diagnosis not present

## 2023-02-26 DIAGNOSIS — M1711 Unilateral primary osteoarthritis, right knee: Secondary | ICD-10-CM | POA: Diagnosis not present

## 2023-02-26 DIAGNOSIS — J449 Chronic obstructive pulmonary disease, unspecified: Secondary | ICD-10-CM | POA: Diagnosis not present

## 2023-02-26 DIAGNOSIS — F419 Anxiety disorder, unspecified: Secondary | ICD-10-CM | POA: Diagnosis not present

## 2023-02-26 DIAGNOSIS — M1611 Unilateral primary osteoarthritis, right hip: Secondary | ICD-10-CM | POA: Diagnosis not present

## 2023-02-26 DIAGNOSIS — K5900 Constipation, unspecified: Secondary | ICD-10-CM | POA: Diagnosis not present

## 2023-02-26 DIAGNOSIS — E559 Vitamin D deficiency, unspecified: Secondary | ICD-10-CM | POA: Diagnosis not present

## 2023-02-26 DIAGNOSIS — E119 Type 2 diabetes mellitus without complications: Secondary | ICD-10-CM | POA: Diagnosis not present

## 2023-02-26 DIAGNOSIS — G4700 Insomnia, unspecified: Secondary | ICD-10-CM | POA: Diagnosis not present

## 2023-02-26 DIAGNOSIS — J9691 Respiratory failure, unspecified with hypoxia: Secondary | ICD-10-CM | POA: Diagnosis not present

## 2023-02-26 DIAGNOSIS — J309 Allergic rhinitis, unspecified: Secondary | ICD-10-CM | POA: Diagnosis not present

## 2023-02-26 DIAGNOSIS — I610 Nontraumatic intracerebral hemorrhage in hemisphere, subcortical: Secondary | ICD-10-CM | POA: Diagnosis not present

## 2023-02-26 DIAGNOSIS — E7800 Pure hypercholesterolemia, unspecified: Secondary | ICD-10-CM | POA: Diagnosis not present

## 2023-02-26 DIAGNOSIS — G9341 Metabolic encephalopathy: Secondary | ICD-10-CM | POA: Diagnosis not present

## 2023-02-26 DIAGNOSIS — F32A Depression, unspecified: Secondary | ICD-10-CM | POA: Diagnosis not present

## 2023-03-03 DIAGNOSIS — M1711 Unilateral primary osteoarthritis, right knee: Secondary | ICD-10-CM | POA: Diagnosis not present

## 2023-03-03 DIAGNOSIS — I610 Nontraumatic intracerebral hemorrhage in hemisphere, subcortical: Secondary | ICD-10-CM | POA: Diagnosis not present

## 2023-03-03 DIAGNOSIS — J449 Chronic obstructive pulmonary disease, unspecified: Secondary | ICD-10-CM | POA: Diagnosis not present

## 2023-03-03 DIAGNOSIS — F419 Anxiety disorder, unspecified: Secondary | ICD-10-CM | POA: Diagnosis not present

## 2023-03-03 DIAGNOSIS — G4700 Insomnia, unspecified: Secondary | ICD-10-CM | POA: Diagnosis not present

## 2023-03-03 DIAGNOSIS — M1611 Unilateral primary osteoarthritis, right hip: Secondary | ICD-10-CM | POA: Diagnosis not present

## 2023-03-03 DIAGNOSIS — J9691 Respiratory failure, unspecified with hypoxia: Secondary | ICD-10-CM | POA: Diagnosis not present

## 2023-03-03 DIAGNOSIS — F32A Depression, unspecified: Secondary | ICD-10-CM | POA: Diagnosis not present

## 2023-03-03 DIAGNOSIS — J309 Allergic rhinitis, unspecified: Secondary | ICD-10-CM | POA: Diagnosis not present

## 2023-03-03 DIAGNOSIS — E559 Vitamin D deficiency, unspecified: Secondary | ICD-10-CM | POA: Diagnosis not present

## 2023-03-03 DIAGNOSIS — K5900 Constipation, unspecified: Secondary | ICD-10-CM | POA: Diagnosis not present

## 2023-03-03 DIAGNOSIS — E7800 Pure hypercholesterolemia, unspecified: Secondary | ICD-10-CM | POA: Diagnosis not present

## 2023-03-03 DIAGNOSIS — E119 Type 2 diabetes mellitus without complications: Secondary | ICD-10-CM | POA: Diagnosis not present

## 2023-03-03 DIAGNOSIS — G9341 Metabolic encephalopathy: Secondary | ICD-10-CM | POA: Diagnosis not present

## 2023-03-03 DIAGNOSIS — M6282 Rhabdomyolysis: Secondary | ICD-10-CM | POA: Diagnosis not present

## 2023-03-03 DIAGNOSIS — I10 Essential (primary) hypertension: Secondary | ICD-10-CM | POA: Diagnosis not present

## 2023-03-05 DIAGNOSIS — I10 Essential (primary) hypertension: Secondary | ICD-10-CM | POA: Diagnosis not present

## 2023-03-05 DIAGNOSIS — K5900 Constipation, unspecified: Secondary | ICD-10-CM | POA: Diagnosis not present

## 2023-03-05 DIAGNOSIS — J9691 Respiratory failure, unspecified with hypoxia: Secondary | ICD-10-CM | POA: Diagnosis not present

## 2023-03-05 DIAGNOSIS — E559 Vitamin D deficiency, unspecified: Secondary | ICD-10-CM | POA: Diagnosis not present

## 2023-03-05 DIAGNOSIS — G9341 Metabolic encephalopathy: Secondary | ICD-10-CM | POA: Diagnosis not present

## 2023-03-05 DIAGNOSIS — E119 Type 2 diabetes mellitus without complications: Secondary | ICD-10-CM | POA: Diagnosis not present

## 2023-03-05 DIAGNOSIS — J309 Allergic rhinitis, unspecified: Secondary | ICD-10-CM | POA: Diagnosis not present

## 2023-03-05 DIAGNOSIS — J449 Chronic obstructive pulmonary disease, unspecified: Secondary | ICD-10-CM | POA: Diagnosis not present

## 2023-03-05 DIAGNOSIS — I610 Nontraumatic intracerebral hemorrhage in hemisphere, subcortical: Secondary | ICD-10-CM | POA: Diagnosis not present

## 2023-03-05 DIAGNOSIS — M1611 Unilateral primary osteoarthritis, right hip: Secondary | ICD-10-CM | POA: Diagnosis not present

## 2023-03-05 DIAGNOSIS — E7800 Pure hypercholesterolemia, unspecified: Secondary | ICD-10-CM | POA: Diagnosis not present

## 2023-03-05 DIAGNOSIS — G4700 Insomnia, unspecified: Secondary | ICD-10-CM | POA: Diagnosis not present

## 2023-03-05 DIAGNOSIS — F32A Depression, unspecified: Secondary | ICD-10-CM | POA: Diagnosis not present

## 2023-03-05 DIAGNOSIS — M1711 Unilateral primary osteoarthritis, right knee: Secondary | ICD-10-CM | POA: Diagnosis not present

## 2023-03-05 DIAGNOSIS — M6282 Rhabdomyolysis: Secondary | ICD-10-CM | POA: Diagnosis not present

## 2023-03-05 DIAGNOSIS — F419 Anxiety disorder, unspecified: Secondary | ICD-10-CM | POA: Diagnosis not present

## 2023-03-09 DIAGNOSIS — E119 Type 2 diabetes mellitus without complications: Secondary | ICD-10-CM | POA: Diagnosis not present

## 2023-03-09 DIAGNOSIS — J449 Chronic obstructive pulmonary disease, unspecified: Secondary | ICD-10-CM | POA: Diagnosis not present

## 2023-03-09 DIAGNOSIS — I10 Essential (primary) hypertension: Secondary | ICD-10-CM | POA: Diagnosis not present

## 2023-03-09 DIAGNOSIS — J9691 Respiratory failure, unspecified with hypoxia: Secondary | ICD-10-CM | POA: Diagnosis not present

## 2023-03-09 DIAGNOSIS — E7800 Pure hypercholesterolemia, unspecified: Secondary | ICD-10-CM | POA: Diagnosis not present

## 2023-03-09 DIAGNOSIS — F419 Anxiety disorder, unspecified: Secondary | ICD-10-CM | POA: Diagnosis not present

## 2023-03-09 DIAGNOSIS — J309 Allergic rhinitis, unspecified: Secondary | ICD-10-CM | POA: Diagnosis not present

## 2023-03-09 DIAGNOSIS — I610 Nontraumatic intracerebral hemorrhage in hemisphere, subcortical: Secondary | ICD-10-CM | POA: Diagnosis not present

## 2023-03-09 DIAGNOSIS — M1611 Unilateral primary osteoarthritis, right hip: Secondary | ICD-10-CM | POA: Diagnosis not present

## 2023-03-09 DIAGNOSIS — M6282 Rhabdomyolysis: Secondary | ICD-10-CM | POA: Diagnosis not present

## 2023-03-09 DIAGNOSIS — G9341 Metabolic encephalopathy: Secondary | ICD-10-CM | POA: Diagnosis not present

## 2023-03-09 DIAGNOSIS — M1711 Unilateral primary osteoarthritis, right knee: Secondary | ICD-10-CM | POA: Diagnosis not present

## 2023-03-09 DIAGNOSIS — E559 Vitamin D deficiency, unspecified: Secondary | ICD-10-CM | POA: Diagnosis not present

## 2023-03-09 DIAGNOSIS — G4700 Insomnia, unspecified: Secondary | ICD-10-CM | POA: Diagnosis not present

## 2023-03-09 DIAGNOSIS — K5900 Constipation, unspecified: Secondary | ICD-10-CM | POA: Diagnosis not present

## 2023-03-09 DIAGNOSIS — F32A Depression, unspecified: Secondary | ICD-10-CM | POA: Diagnosis not present

## 2023-03-13 DIAGNOSIS — R1312 Dysphagia, oropharyngeal phase: Secondary | ICD-10-CM | POA: Diagnosis not present

## 2023-03-13 DIAGNOSIS — J449 Chronic obstructive pulmonary disease, unspecified: Secondary | ICD-10-CM | POA: Diagnosis not present

## 2023-03-13 DIAGNOSIS — R54 Age-related physical debility: Secondary | ICD-10-CM | POA: Diagnosis not present

## 2023-03-13 DIAGNOSIS — G9341 Metabolic encephalopathy: Secondary | ICD-10-CM | POA: Diagnosis not present

## 2023-03-16 DIAGNOSIS — M1611 Unilateral primary osteoarthritis, right hip: Secondary | ICD-10-CM | POA: Diagnosis not present

## 2023-03-16 DIAGNOSIS — Z87440 Personal history of urinary (tract) infections: Secondary | ICD-10-CM | POA: Diagnosis not present

## 2023-03-16 DIAGNOSIS — I610 Nontraumatic intracerebral hemorrhage in hemisphere, subcortical: Secondary | ICD-10-CM | POA: Diagnosis not present

## 2023-03-16 DIAGNOSIS — E7800 Pure hypercholesterolemia, unspecified: Secondary | ICD-10-CM | POA: Diagnosis not present

## 2023-03-16 DIAGNOSIS — J9691 Respiratory failure, unspecified with hypoxia: Secondary | ICD-10-CM | POA: Diagnosis not present

## 2023-03-16 DIAGNOSIS — F32A Depression, unspecified: Secondary | ICD-10-CM | POA: Diagnosis not present

## 2023-03-16 DIAGNOSIS — Z7951 Long term (current) use of inhaled steroids: Secondary | ICD-10-CM | POA: Diagnosis not present

## 2023-03-16 DIAGNOSIS — E119 Type 2 diabetes mellitus without complications: Secondary | ICD-10-CM | POA: Diagnosis not present

## 2023-03-16 DIAGNOSIS — J449 Chronic obstructive pulmonary disease, unspecified: Secondary | ICD-10-CM | POA: Diagnosis not present

## 2023-03-16 DIAGNOSIS — Z9181 History of falling: Secondary | ICD-10-CM | POA: Diagnosis not present

## 2023-03-16 DIAGNOSIS — Z7984 Long term (current) use of oral hypoglycemic drugs: Secondary | ICD-10-CM | POA: Diagnosis not present

## 2023-03-16 DIAGNOSIS — G9341 Metabolic encephalopathy: Secondary | ICD-10-CM | POA: Diagnosis not present

## 2023-03-16 DIAGNOSIS — J309 Allergic rhinitis, unspecified: Secondary | ICD-10-CM | POA: Diagnosis not present

## 2023-03-16 DIAGNOSIS — K5900 Constipation, unspecified: Secondary | ICD-10-CM | POA: Diagnosis not present

## 2023-03-16 DIAGNOSIS — R339 Retention of urine, unspecified: Secondary | ICD-10-CM | POA: Diagnosis not present

## 2023-03-16 DIAGNOSIS — F419 Anxiety disorder, unspecified: Secondary | ICD-10-CM | POA: Diagnosis not present

## 2023-03-16 DIAGNOSIS — M1711 Unilateral primary osteoarthritis, right knee: Secondary | ICD-10-CM | POA: Diagnosis not present

## 2023-03-16 DIAGNOSIS — E559 Vitamin D deficiency, unspecified: Secondary | ICD-10-CM | POA: Diagnosis not present

## 2023-03-16 DIAGNOSIS — G4700 Insomnia, unspecified: Secondary | ICD-10-CM | POA: Diagnosis not present

## 2023-03-16 DIAGNOSIS — I10 Essential (primary) hypertension: Secondary | ICD-10-CM | POA: Diagnosis not present

## 2023-03-16 DIAGNOSIS — Z96642 Presence of left artificial hip joint: Secondary | ICD-10-CM | POA: Diagnosis not present

## 2023-03-16 DIAGNOSIS — M6282 Rhabdomyolysis: Secondary | ICD-10-CM | POA: Diagnosis not present

## 2023-03-18 ENCOUNTER — Ambulatory Visit: Payer: Medicare Other | Admitting: Urology

## 2023-03-18 DIAGNOSIS — M6282 Rhabdomyolysis: Secondary | ICD-10-CM | POA: Diagnosis not present

## 2023-03-18 DIAGNOSIS — J9691 Respiratory failure, unspecified with hypoxia: Secondary | ICD-10-CM | POA: Diagnosis not present

## 2023-03-18 DIAGNOSIS — I610 Nontraumatic intracerebral hemorrhage in hemisphere, subcortical: Secondary | ICD-10-CM | POA: Diagnosis not present

## 2023-03-18 DIAGNOSIS — F32A Depression, unspecified: Secondary | ICD-10-CM | POA: Diagnosis not present

## 2023-03-18 DIAGNOSIS — F419 Anxiety disorder, unspecified: Secondary | ICD-10-CM | POA: Diagnosis not present

## 2023-03-18 DIAGNOSIS — G4700 Insomnia, unspecified: Secondary | ICD-10-CM | POA: Diagnosis not present

## 2023-03-18 DIAGNOSIS — K5900 Constipation, unspecified: Secondary | ICD-10-CM | POA: Diagnosis not present

## 2023-03-18 DIAGNOSIS — M1711 Unilateral primary osteoarthritis, right knee: Secondary | ICD-10-CM | POA: Diagnosis not present

## 2023-03-18 DIAGNOSIS — E7800 Pure hypercholesterolemia, unspecified: Secondary | ICD-10-CM | POA: Diagnosis not present

## 2023-03-18 DIAGNOSIS — G9341 Metabolic encephalopathy: Secondary | ICD-10-CM | POA: Diagnosis not present

## 2023-03-18 DIAGNOSIS — E119 Type 2 diabetes mellitus without complications: Secondary | ICD-10-CM | POA: Diagnosis not present

## 2023-03-18 DIAGNOSIS — J309 Allergic rhinitis, unspecified: Secondary | ICD-10-CM | POA: Diagnosis not present

## 2023-03-18 DIAGNOSIS — E559 Vitamin D deficiency, unspecified: Secondary | ICD-10-CM | POA: Diagnosis not present

## 2023-03-18 DIAGNOSIS — M1611 Unilateral primary osteoarthritis, right hip: Secondary | ICD-10-CM | POA: Diagnosis not present

## 2023-03-18 DIAGNOSIS — I10 Essential (primary) hypertension: Secondary | ICD-10-CM | POA: Diagnosis not present

## 2023-03-18 DIAGNOSIS — J449 Chronic obstructive pulmonary disease, unspecified: Secondary | ICD-10-CM | POA: Diagnosis not present

## 2023-03-25 DIAGNOSIS — J449 Chronic obstructive pulmonary disease, unspecified: Secondary | ICD-10-CM | POA: Diagnosis not present

## 2023-03-25 DIAGNOSIS — E119 Type 2 diabetes mellitus without complications: Secondary | ICD-10-CM | POA: Diagnosis not present

## 2023-03-25 DIAGNOSIS — I610 Nontraumatic intracerebral hemorrhage in hemisphere, subcortical: Secondary | ICD-10-CM | POA: Diagnosis not present

## 2023-03-25 DIAGNOSIS — M1611 Unilateral primary osteoarthritis, right hip: Secondary | ICD-10-CM | POA: Diagnosis not present

## 2023-03-25 DIAGNOSIS — M6282 Rhabdomyolysis: Secondary | ICD-10-CM | POA: Diagnosis not present

## 2023-03-25 DIAGNOSIS — E7800 Pure hypercholesterolemia, unspecified: Secondary | ICD-10-CM | POA: Diagnosis not present

## 2023-03-25 DIAGNOSIS — I10 Essential (primary) hypertension: Secondary | ICD-10-CM | POA: Diagnosis not present

## 2023-03-25 DIAGNOSIS — M1711 Unilateral primary osteoarthritis, right knee: Secondary | ICD-10-CM | POA: Diagnosis not present

## 2023-03-25 DIAGNOSIS — F32A Depression, unspecified: Secondary | ICD-10-CM | POA: Diagnosis not present

## 2023-03-25 DIAGNOSIS — F419 Anxiety disorder, unspecified: Secondary | ICD-10-CM | POA: Diagnosis not present

## 2023-03-25 DIAGNOSIS — J9691 Respiratory failure, unspecified with hypoxia: Secondary | ICD-10-CM | POA: Diagnosis not present

## 2023-03-25 DIAGNOSIS — J309 Allergic rhinitis, unspecified: Secondary | ICD-10-CM | POA: Diagnosis not present

## 2023-03-25 DIAGNOSIS — G4700 Insomnia, unspecified: Secondary | ICD-10-CM | POA: Diagnosis not present

## 2023-03-25 DIAGNOSIS — G9341 Metabolic encephalopathy: Secondary | ICD-10-CM | POA: Diagnosis not present

## 2023-03-25 DIAGNOSIS — E559 Vitamin D deficiency, unspecified: Secondary | ICD-10-CM | POA: Diagnosis not present

## 2023-03-25 DIAGNOSIS — K5900 Constipation, unspecified: Secondary | ICD-10-CM | POA: Diagnosis not present

## 2023-03-27 ENCOUNTER — Other Ambulatory Visit: Payer: Self-pay

## 2023-03-27 ENCOUNTER — Emergency Department (HOSPITAL_COMMUNITY)
Admission: EM | Admit: 2023-03-27 | Discharge: 2023-03-27 | Disposition: A | Discharge status: Home / Self Care | Payer: Medicare Other | Attending: Student | Admitting: Student

## 2023-03-27 ENCOUNTER — Emergency Department (HOSPITAL_COMMUNITY): Payer: Medicare Other

## 2023-03-27 ENCOUNTER — Encounter (HOSPITAL_COMMUNITY): Payer: Self-pay

## 2023-03-27 DIAGNOSIS — G4489 Other headache syndrome: Secondary | ICD-10-CM | POA: Diagnosis not present

## 2023-03-27 DIAGNOSIS — R0689 Other abnormalities of breathing: Secondary | ICD-10-CM | POA: Diagnosis not present

## 2023-03-27 DIAGNOSIS — R519 Headache, unspecified: Secondary | ICD-10-CM | POA: Diagnosis not present

## 2023-03-27 DIAGNOSIS — Z7984 Long term (current) use of oral hypoglycemic drugs: Secondary | ICD-10-CM | POA: Insufficient documentation

## 2023-03-27 DIAGNOSIS — E119 Type 2 diabetes mellitus without complications: Secondary | ICD-10-CM | POA: Diagnosis not present

## 2023-03-27 DIAGNOSIS — I10 Essential (primary) hypertension: Secondary | ICD-10-CM | POA: Diagnosis not present

## 2023-03-27 DIAGNOSIS — M1611 Unilateral primary osteoarthritis, right hip: Secondary | ICD-10-CM | POA: Diagnosis not present

## 2023-03-27 DIAGNOSIS — G238 Other specified degenerative diseases of basal ganglia: Secondary | ICD-10-CM | POA: Diagnosis not present

## 2023-03-27 DIAGNOSIS — R42 Dizziness and giddiness: Secondary | ICD-10-CM | POA: Insufficient documentation

## 2023-03-27 DIAGNOSIS — R29810 Facial weakness: Secondary | ICD-10-CM | POA: Diagnosis not present

## 2023-03-27 DIAGNOSIS — Z79899 Other long term (current) drug therapy: Secondary | ICD-10-CM | POA: Insufficient documentation

## 2023-03-27 DIAGNOSIS — R9082 White matter disease, unspecified: Secondary | ICD-10-CM | POA: Diagnosis not present

## 2023-03-27 DIAGNOSIS — Z96642 Presence of left artificial hip joint: Secondary | ICD-10-CM | POA: Diagnosis not present

## 2023-03-27 DIAGNOSIS — J449 Chronic obstructive pulmonary disease, unspecified: Secondary | ICD-10-CM | POA: Diagnosis not present

## 2023-03-27 DIAGNOSIS — Z471 Aftercare following joint replacement surgery: Secondary | ICD-10-CM | POA: Diagnosis not present

## 2023-03-27 DIAGNOSIS — G9389 Other specified disorders of brain: Secondary | ICD-10-CM | POA: Diagnosis not present

## 2023-03-27 DIAGNOSIS — Z7951 Long term (current) use of inhaled steroids: Secondary | ICD-10-CM | POA: Diagnosis not present

## 2023-03-27 DIAGNOSIS — Z043 Encounter for examination and observation following other accident: Secondary | ICD-10-CM | POA: Diagnosis not present

## 2023-03-27 LAB — URINALYSIS, ROUTINE W REFLEX MICROSCOPIC
Bacteria, UA: NONE SEEN
Bilirubin Urine: NEGATIVE
Glucose, UA: NEGATIVE mg/dL
Hgb urine dipstick: NEGATIVE
Ketones, ur: NEGATIVE mg/dL
Nitrite: POSITIVE — AB
Protein, ur: NEGATIVE mg/dL
Specific Gravity, Urine: 1.012 (ref 1.005–1.030)
pH: 7 (ref 5.0–8.0)

## 2023-03-27 LAB — COMPREHENSIVE METABOLIC PANEL
ALT: 19 U/L (ref 0–44)
AST: 21 U/L (ref 15–41)
Albumin: 4.3 g/dL (ref 3.5–5.0)
Alkaline Phosphatase: 66 U/L (ref 38–126)
Anion gap: 8 (ref 5–15)
BUN: 18 mg/dL (ref 8–23)
CO2: 24 mmol/L (ref 22–32)
Calcium: 9.5 mg/dL (ref 8.9–10.3)
Chloride: 99 mmol/L (ref 98–111)
Creatinine, Ser: 0.57 mg/dL (ref 0.44–1.00)
GFR, Estimated: >60 mL/min (ref >60)
Glucose, Bld: 135 mg/dL — ABNORMAL HIGH (ref 70–99)
Potassium: 4.6 mmol/L (ref 3.5–5.1)
Sodium: 131 mmol/L — ABNORMAL LOW (ref 135–145)
Total Bilirubin: 0.7 mg/dL (ref <1.2)
Total Protein: 7.4 g/dL (ref 6.5–8.1)

## 2023-03-27 LAB — CBC WITH DIFFERENTIAL/PLATELET
Abs Immature Granulocytes: 0.02 10*3/uL (ref 0.00–0.07)
Basophils Absolute: 0.1 10*3/uL (ref 0.0–0.1)
Basophils Relative: 1 %
Eosinophils Absolute: 0.1 10*3/uL (ref 0.0–0.5)
Eosinophils Relative: 2 %
HCT: 42.7 % (ref 36.0–46.0)
Hemoglobin: 14.2 g/dL (ref 12.0–15.0)
Immature Granulocytes: 0 %
Lymphocytes Relative: 24 %
Lymphs Abs: 1.9 10*3/uL (ref 0.7–4.0)
MCH: 32 pg (ref 26.0–34.0)
MCHC: 33.3 g/dL (ref 30.0–36.0)
MCV: 96.2 fL (ref 80.0–100.0)
Monocytes Absolute: 0.4 10*3/uL (ref 0.1–1.0)
Monocytes Relative: 6 %
Neutro Abs: 5.3 10*3/uL (ref 1.7–7.7)
Neutrophils Relative %: 67 %
Platelets: 302 10*3/uL (ref 150–400)
RBC: 4.44 MIL/uL (ref 3.87–5.11)
RDW: 12.7 % (ref 11.5–15.5)
WBC: 7.9 10*3/uL (ref 4.0–10.5)
nRBC: 0 % (ref 0.0–0.2)

## 2023-03-27 LAB — CK: Total CK: 66 U/L (ref 38–234)

## 2023-03-27 MED ORDER — MELATONIN 3 MG PO TABS: 6.0000 mg | ORAL_TABLET | Freq: Every day | ORAL | Status: DC

## 2023-03-27 MED ORDER — MECLIZINE HCL 12.5 MG PO TABS
25.0000 mg | ORAL_TABLET | Freq: Once | ORAL | Status: AC
  Administered 2023-03-27: 25 mg via ORAL
  Filled 2023-03-27: qty 2

## 2023-03-27 MED ORDER — MECLIZINE HCL 25 MG PO TABS: 25.0000 mg | ORAL_TABLET | Freq: Three times a day (TID) | ORAL | 0 refills | Status: DC | PRN

## 2023-03-27 MED ORDER — ATENOLOL 25 MG PO TABS: 25.0000 mg | ORAL_TABLET | Freq: Two times a day (BID) | ORAL | Status: DC

## 2023-03-27 MED ORDER — ALPRAZOLAM 0.5 MG PO TABS: 0.2500 mg | ORAL_TABLET | Freq: Two times a day (BID) | ORAL | Status: DC | PRN

## 2023-03-27 MED ORDER — DICLOFENAC SODIUM 1 % EX GEL
4.0000 g | Freq: Four times a day (QID) | CUTANEOUS | Status: DC
  Filled 2023-03-27: qty 100

## 2023-03-27 MED ORDER — GABAPENTIN 100 MG PO CAPS: 100.0000 mg | ORAL_CAPSULE | Freq: Every day | ORAL | Status: DC

## 2023-03-27 MED ORDER — CLONIDINE HCL 0.1 MG PO TABS: 0.1000 mg | ORAL_TABLET | Freq: Every evening | ORAL | Status: DC | PRN

## 2023-03-27 NOTE — ED Triage Notes (Signed)
Pt is HOH, better hearing on left side.

## 2023-03-27 NOTE — ED Triage Notes (Signed)
EMS called out for dizziness. Pt states she's been "dizzy" since this morning about 8am. She also states she fell this am at home (uses a walker at home) also states she did not hit her head, but c/o pain in both legs but says it could be from arthritis. Pt has chronic involuntary termor. Hx of diabetes (CBG 161 with EMS) Pt states she has a slight headache presently.

## 2023-03-27 NOTE — ED Provider Notes (Signed)
[place] EMERGENCY DEPARTMENT AT [facility] Provider Note  CSN: [id number] Arrival date & time: 03/27/23 1820  Chief Complaint(s) Dizziness  HPI [name] [name] is a 89 y.o. female with PMH anxiety, depression, COPD, T2DM, HTN, HLD who presents emergency department for evaluation of dizziness.  States that symptoms began this morning upon awakening.  Suffered a fall earlier this morning where she was caught by a family member.  No head strike or loss of consciousness.  Endorses vertigo worse with head movement.  Denies associated chest pain, shortness of breath, headache, fever or other systemic symptoms.   Past Medical History Past Medical History:  Diagnosis Date   Anxiety    Arthritis    Atrophic vaginitis    Cataract    COPD (chronic obstructive pulmonary disease) (HCC)    Depressive disorder, not elsewhere classified    Diabetes mellitus without complication (HCC)    Disorder of bone and cartilage, unspecified    Elevated blood sugar    Emphysema lung (HCC)    Hypertension    Other and unspecified hyperlipidemia    Postmenopausal    Rhinitis    Patient Active Problem List   Diagnosis Date Noted   Protein-calorie malnutrition, severe 01/14/2023   Rhabdomyolysis 01/12/2023   Brain contusion (HCC) 01/12/2023   Fall at home, initial encounter 01/12/2023   Transaminitis 01/12/2023   UTI (urinary tract infection) 01/12/2023   Hypertension associated with diabetes (HCC) 02/20/2019   Hyperlipidemia associated with type 2 diabetes mellitus (HCC) 02/20/2019   Depression, recurrent (HCC) 02/20/2019   Type 2 diabetes mellitus without complication, without long-term current use of insulin (HCC) 09/26/2015   COPD (chronic obstructive pulmonary disease) (HCC) 05/29/2015   Dermatographia 05/16/2015   Rectal bleeding 03/07/2014   History of total left hip arthroplasty 01/25/2014   Edema 01/02/2014   Hypoxia 01/02/2014   S/P total hip arthroplasty 12/27/2013    Anxiety    Depression with anxiety    Rhinitis    Atrophic vaginitis    Postmenopausal    Home Medication(s) Prior to Admission medications   Medication Sig Start Date End Date Taking? Authorizing Provider  acetaminophen (TYLENOL) 500 MG tablet Take 500 mg by mouth every 6 (six) hours as needed for moderate pain.    [provider]  albuterol (PROVENTIL) (2.5 MG/3ML) 0.083% nebulizer solution NEBULIZE 1 VIAL EVERY 6 HOURS AS NEEDED FOR SHORTNESS OF BREATH OR WHEEZING Patient taking differently: Take 2.5 mg by nebulization every 6 (six) hours as needed for wheezing or shortness of breath. 09/19/18   Daphine Deutscher, Mary-Margaret, FNP  albuterol (VENTOLIN HFA) 108 (90 Base) MCG/ACT inhaler Inhale 2 puffs into the lungs every 6 (six) hours as needed for wheezing or shortness of breath. 02/20/15   [provider]  ALPRAZolam Prudy Feeler) 0.25 MG tablet Take 1 tablet (0.25 mg total) by mouth 2 (two) times daily as needed for anxiety. 01/15/23   Lanae Boast, MD  amLODipine (NORVASC) 2.5 MG tablet Take 1 tablet (2.5 mg total) by mouth daily. 03/20/19   Dettinger, Elige Radon, MD  atenolol (TENORMIN) 25 MG tablet Take 25 mg by mouth 2 (two) times daily.    [provider]  bacitracin ointment Apply 1 Application topically 2 (two) times daily. 12/16/21   Peter Garter, PA  blood glucose meter kit and supplies KIT Check BS once daily 12/12/15   Ernestina Penna, MD  Blood Glucose Monitoring Suppl (ONETOUCH VERIO FLEX SYSTEM) w/Device KIT CHECK GLUCOSE TWICE DAILY AS NEEDED Dx  E11.9 03/13/19   Sonny Masters, FNP  budesonide-formoterol (SYMBICORT) 160-4.5 MCG/ACT inhaler Inhale 2 puffs into the lungs 2 (two) times daily. 10/05/17   Dettinger, Elige Radon, MD  calcium carbonate (SUPER CALCIUM) 1500 (600 Ca) MG TABS tablet Take by mouth. 03/11/15   [provider]  cholecalciferol (VITAMIN D) 1000 UNITS tablet Take 2,000 Units by mouth daily.    [provider]  cloNIDine (CATAPRES) 0.1  MG tablet Take 1 tablet (0.1 mg total) by mouth at bedtime as needed. 05/19/19   Sonny Masters, FNP  escitalopram (LEXAPRO) 20 MG tablet Take 1 tablet (20 mg total) by mouth daily. as directed 03/20/19   Dettinger, Elige Radon, MD  feeding supplement (ENSURE ENLIVE / ENSURE PLUS) LIQD Take 237 mLs by mouth 2 (two) times daily between meals. 01/15/23   Lanae Boast, MD  fluticasone (FLONASE) 50 MCG/ACT nasal spray USE ONE SPRAY in each nostril ONCE daily 05/19/19   Rakes, Doralee Albino, FNP  gabapentin (NEURONTIN) 100 MG capsule Take 1 capsule (100 mg total) by mouth at bedtime. 02/27/20   Persons, [place], PA  glucose blood (ONETOUCH VERIO) test strip CHECK GLUCOSE TWICE DAILY AS NEEDED Dx E11.9 03/13/19   Sonny Masters, FNP  ketoconazole (NIZORAL) 2 % shampoo Apply 1 application topically 2 (two) times a week. 05/26/18   Bennie Pierini, FNP  Lancet Devices (ONE TOUCH DELICA LANCING DEV) MISC Use to check BG up to twice a day. DX: E11.29 - type 2 DM 09/02/16   Ernestina Penna, MD  Lancets [facility] ULTRASOFT) lancets CHECK GLUCOSE TWICE DAILY AS NEEDED Dx E11.9 03/13/19   Sonny Masters, FNP  losartan (COZAAR) 25 MG tablet Take 25 mg by mouth daily. 12/07/22   [provider]  lovastatin (MEVACOR) 40 MG tablet Take 1 tablet (40 mg total) by mouth daily. 03/20/19   Dettinger, Elige Radon, MD  Melatonin 5 MG CAPS Take 1 capsule (5 mg total) by mouth at bedtime. 03/20/19   Dettinger, Elige Radon, MD  metFORMIN (GLUCOPHAGE-XR) 500 MG 24 hr tablet 1 tablet once daily Patient taking differently: Take 500 mg by mouth daily with breakfast. 1 tablet once daily 03/20/19   Dettinger, Elige Radon, MD  Multiple Vitamin (MULTIVITAMIN WITH MINERALS) TABS tablet Take 1 tablet by mouth daily. 01/15/23   Lanae Boast, MD  neomycin-bacitracin-polymyxin (NEOSPORIN) [phone] ointment Apply 1 Application topically 4 (four) times daily.    [provider]  Omega-3 Fatty Acids (FISH OIL) 1000 MG CAPS Take 1 capsule by mouth  daily.    [provider]  oxyCODONE (OXY IR/ROXICODONE) 5 MG immediate release tablet Take 1 tablet (5 mg total) by mouth every 8 (eight) hours as needed for up to 2 doses for moderate pain or severe pain. 01/15/23   Lanae Boast, MD  triamcinolone cream (KENALOG) 0.1 % Apply 1 application topically 2 (two) times daily. 02/20/19   Sonny Masters, FNP  Past Surgical History Past Surgical History:  Procedure Laterality Date   APPENDECTOMY  1957   CATARACT EXTRACTION W/ INTRAOCULAR LENS IMPLANT Bilateral 2005   [place]   COLONOSCOPY N/A 03/08/2014   Procedure: COLONOSCOPY;  Surgeon: Malissa Hippo, MD;  Location: [facility] SUITE;  Service: Endoscopy;  Laterality: N/A;   EYE SURGERY     JOINT REPLACEMENT     TOTAL HIP ARTHROPLASTY Left 12/27/2013   Procedure: LEFT TOTAL HIP ARTHROPLASTY;  Surgeon: Nadara Mustard, MD;  Location: [facility] OR;  Service: Orthopedics;  Laterality: Left;   Family History Family History  Problem Relation Age of Onset   Arthritis Mother    COPD Father    Diabetes Father    Aneurysm Sister    Diabetes Sister    Diabetes Sister    Hypertension Sister    COPD Daughter    Fibromyalgia Daughter    Healthy Daughter     Social History Social History   Tobacco Use   Smoking status: Never   Smokeless tobacco: Never  Vaping Use   Vaping status: Never Used  Substance Use Topics   Alcohol use: No    Alcohol/week: 0.0 standard drinks of alcohol   Drug use: No   Allergies Lisinopril, Chlordiazepoxide hcl, Chlordiazepoxide-clidinium, Clarithromycin, Clarithromycin, Penicillin g, Penicillins, Povidone-iodine, Raloxifene hydrochloride, Sertraline hcl, Sulfacetamide sodium, Sulfamethoxazole-trimethoprim, Betadine [povidone iodine], Erythromycin, Hydrogen peroxide, Sertraline hcl, and Sulfa antibiotics  Review of Systems Review of Systems   Neurological:  Positive for dizziness.    Physical Exam Vital Signs  I have reviewed the triage vital signs BP (!) 167/65 (BP Location: Left Arm)   Pulse 67   Temp 98.5 F (36.9 C) (Oral)   Resp (!) 29   Ht 5\' 2"  (1.575 m)   Wt 60.1 kg   SpO2 95%   BMI 24.25 kg/m   Physical Exam Vitals and nursing note reviewed.  Constitutional:      General: She is not in acute distress.    Appearance: She is well-developed.  HENT:     Head: Normocephalic and atraumatic.  Eyes:     Conjunctiva/sclera: Conjunctivae normal.  Cardiovascular:     Rate and Rhythm: Normal rate and regular rhythm.     Heart sounds: No murmur heard. Pulmonary:     Effort: Pulmonary effort is normal. No respiratory distress.     Breath sounds: Normal breath sounds.  Abdominal:     Palpations: Abdomen is soft.     Tenderness: There is no abdominal tenderness.  Musculoskeletal:        General: No swelling.     Cervical back: Neck supple.  Skin:    General: Skin is warm and dry.     Capillary Refill: Capillary refill takes less than 2 seconds.  Neurological:     Mental Status: She is alert.  Psychiatric:        Mood and Affect: Mood normal.     ED Results and Treatments Labs (all labs ordered are listed, but only abnormal results are displayed) Labs Reviewed  COMPREHENSIVE METABOLIC PANEL - Abnormal; Notable for the following components:      Result Value   Sodium 131 (*)    Glucose, Bld 135 (*)    All other components within normal limits  CBC WITH DIFFERENTIAL/PLATELET  CK  URINALYSIS, ROUTINE W REFLEX MICROSCOPIC  Radiology DG Pelvis Portable Result Date: 03/27/2023 CLINICAL DATA:  Fall EXAM: PORTABLE PELVIS 1 VIEWS COMPARISON:  None Available. FINDINGS: Deformity of the right femoral head with severe degenerative joint disease consistent with underlying AVN. No acute  fracture, dislocation or subluxation. Pelvic ring intact. Status post left hip arthroplasty. IMPRESSION: Right hip AVN with severe secondary osteoarthritis. Electronically Signed   By: Layla Maw M.D.   On: 03/27/2023 19:38   DG Chest Portable 1 View Result Date: 03/27/2023 CLINICAL DATA:  Fall EXAM: PORTABLE CHEST 1 VIEW COMPARISON:  Chest x-ray 01/11/2023 FINDINGS: The heart size and mediastinal contours are within normal limits. Both lungs are clear. The visualized skeletal structures are unremarkable. IMPRESSION: No active disease. Electronically Signed   By: Darliss Cheney M.D.   On: 03/27/2023 19:28   CT HEAD WO CONTRAST ( ) Result Date: 03/27/2023 CLINICAL DATA:  Patient reports being dizzy since 8 a.m. today. Patient fall at home without trauma to head. Headache. EXAM: CT HEAD WITHOUT CONTRAST TECHNIQUE: Contiguous axial images were obtained from the base of the skull through the vertex without intravenous contrast. RADIATION DOSE REDUCTION: This exam was performed according to the departmental dose-optimization program which includes automated exposure control, adjustment of the mA and/or kV according to patient size and/or use of iterative reconstruction technique. COMPARISON:  CT head without contrast 01/11/2023 FINDINGS: Brain: Focal subcortical calcification is again noted in the posterior left frontal lobe, seen best on axial images 43 and 4 of series 2 and coronal image 36 of series 4. The appearance is similar to the previous coronal images. Basal ganglia calcifications are present. Mild generalized atrophy and white matter disease is otherwise stable. No acute infarct, hemorrhage, or mass lesion is present. The ventricles are of normal size. No significant extraaxial fluid collection is present. The brainstem and cerebellum are within normal limits. Midline structures are within normal limits. Vascular: Atherosclerotic calcifications are present within the cavernous internal carotid  arteries bilaterally. No hyperdense vessel is present. Skull: Calvarium is intact. No focal lytic or blastic lesions are present. No significant extracranial soft tissue lesion is present. Sinuses/Orbits: The paranasal sinuses and mastoid air cells are clear. Bilateral lens replacements are noted. Globes and orbits are otherwise unremarkable. IMPRESSION: 1. No acute intracranial abnormality or significant interval change. 2. Stable focal subcortical calcification in the posterior left frontal lobe. This is nonspecific. 3. Stable mild generalized atrophy and white matter disease. This likely reflects the sequela of chronic microvascular ischemia. Electronically Signed   By: Marin Roberts M.D.   On: 03/27/2023 19:01    Pertinent labs & imaging results that were available during my care of the patient were reviewed by me and considered in my medical decision making (see MDM for details).  Medications Ordered in ED Medications  meclizine (ANTIVERT) tablet 25 mg (has no administration in time range)  Procedures Procedures  (including critical care time)  Medical Decision Making / ED Course   This patient presents to the ED for concern of dizziness, this involves an extensive number of treatment options, and is a complaint that carries with it a high risk of complications and morbidity.  The differential diagnosis includes BPPV, CVA, electrolyte abnormality, labyrinthitis/vestibular neuritis, Meniere's disease  MDM: Patient seen emergency room for evaluation of vertigo.  Physical exam with significant tenderness at the joints of lower extremities consistent with her known history of severe arthritis but neurologic exam unremarkable.  No motor or sensory deficits on exam.  No cranial nerve deficits.  Mild inducible, fatigable nystagmus worse with head movement.  No  nystagmus at rest.  Laboratory evaluation with a sodium of 131 but is otherwise unremarkable.  Urinalysis with positive nitrites and trace leuk esterase but no white blood cells and no bacteria and in the absence of urinary symptoms likely does not explain the patient's vertigo and we will hold off on antibiotics at this time.  CT head with no acute findings.  Chest x-ray, pelvis x-ray with no acute traumatic injury.  Does show known arthritis in the hip.  Patient given meclizine with mild improvement of her symptoms.  At this time, patient presentation consistent with peripheral vertigo and she currently does not meet inpatient criteria for admission.  Spoke with multiple family members and they were given return precautions of which they voiced understanding.  Patient discharged with outpatient follow-up   Additional history obtained: -Additional history obtained from multiple family members -External records from outside source obtained and reviewed including: Chart review including previous notes, labs, imaging, consultation notes   Lab Tests: -I ordered, reviewed, and interpreted labs.   The pertinent results include:   Labs Reviewed  COMPREHENSIVE METABOLIC PANEL - Abnormal; Notable for the following components:      Result Value   Sodium 131 (*)    Glucose, Bld 135 (*)    All other components within normal limits  CBC WITH DIFFERENTIAL/PLATELET  CK  URINALYSIS, ROUTINE W REFLEX MICROSCOPIC     Imaging Studies ordered: I ordered imaging studies including CT head, chest x-ray, pelvis x-ray I independently visualized and interpreted imaging. I agree with the radiologist interpretation   Medicines ordered and prescription drug management: Meds ordered this encounter  Medications   meclizine (ANTIVERT) tablet 25 mg    -I have reviewed the patients home medicines and have made adjustments as needed  Critical interventions none    Cardiac Monitoring: The patient was  maintained on a cardiac monitor.  I personally viewed and interpreted the cardiac monitored which showed an underlying rhythm of: NSR  Social Determinants of Health:  Factors impacting patients care include: none   Reevaluation: After the interventions noted above, I reevaluated the patient and found that they have :improved  Co morbidities that complicate the patient evaluation  Past Medical History:  Diagnosis Date   Anxiety    Arthritis    Atrophic vaginitis    Cataract    COPD (chronic obstructive pulmonary disease) (HCC)    Depressive disorder, not elsewhere classified    Diabetes mellitus without complication (HCC)    Disorder of bone and cartilage, unspecified    Elevated blood sugar    Emphysema lung (HCC)    Hypertension    Other and unspecified hyperlipidemia    Postmenopausal    Rhinitis       Dispostion: I considered admission for this patient, but at this time  she does not meet inpatient criteria for admission and will be discharged with outpatient follow-up     Final Clinical Impression(s) / ED Diagnoses Final diagnoses:  None     @PCDICTATION @    Glendora Score, MD 03/28/23 1258

## 2023-03-28 DIAGNOSIS — R42 Dizziness and giddiness: Secondary | ICD-10-CM | POA: Diagnosis not present

## 2023-04-01 DIAGNOSIS — K5900 Constipation, unspecified: Secondary | ICD-10-CM | POA: Diagnosis not present

## 2023-04-01 DIAGNOSIS — I10 Essential (primary) hypertension: Secondary | ICD-10-CM | POA: Diagnosis not present

## 2023-04-01 DIAGNOSIS — M6282 Rhabdomyolysis: Secondary | ICD-10-CM | POA: Diagnosis not present

## 2023-04-01 DIAGNOSIS — J449 Chronic obstructive pulmonary disease, unspecified: Secondary | ICD-10-CM | POA: Diagnosis not present

## 2023-04-01 DIAGNOSIS — E559 Vitamin D deficiency, unspecified: Secondary | ICD-10-CM | POA: Diagnosis not present

## 2023-04-01 DIAGNOSIS — J9691 Respiratory failure, unspecified with hypoxia: Secondary | ICD-10-CM | POA: Diagnosis not present

## 2023-04-01 DIAGNOSIS — E7800 Pure hypercholesterolemia, unspecified: Secondary | ICD-10-CM | POA: Diagnosis not present

## 2023-04-01 DIAGNOSIS — J309 Allergic rhinitis, unspecified: Secondary | ICD-10-CM | POA: Diagnosis not present

## 2023-04-01 DIAGNOSIS — G4700 Insomnia, unspecified: Secondary | ICD-10-CM | POA: Diagnosis not present

## 2023-04-01 DIAGNOSIS — E119 Type 2 diabetes mellitus without complications: Secondary | ICD-10-CM | POA: Diagnosis not present

## 2023-04-01 DIAGNOSIS — M1611 Unilateral primary osteoarthritis, right hip: Secondary | ICD-10-CM | POA: Diagnosis not present

## 2023-04-01 DIAGNOSIS — F32A Depression, unspecified: Secondary | ICD-10-CM | POA: Diagnosis not present

## 2023-04-01 DIAGNOSIS — G9341 Metabolic encephalopathy: Secondary | ICD-10-CM | POA: Diagnosis not present

## 2023-04-01 DIAGNOSIS — M1711 Unilateral primary osteoarthritis, right knee: Secondary | ICD-10-CM | POA: Diagnosis not present

## 2023-04-01 DIAGNOSIS — I610 Nontraumatic intracerebral hemorrhage in hemisphere, subcortical: Secondary | ICD-10-CM | POA: Diagnosis not present

## 2023-04-01 DIAGNOSIS — F419 Anxiety disorder, unspecified: Secondary | ICD-10-CM | POA: Diagnosis not present

## 2023-04-05 DIAGNOSIS — R262 Difficulty in walking, not elsewhere classified: Secondary | ICD-10-CM | POA: Diagnosis not present

## 2023-04-05 DIAGNOSIS — E559 Vitamin D deficiency, unspecified: Secondary | ICD-10-CM | POA: Diagnosis not present

## 2023-04-05 DIAGNOSIS — J449 Chronic obstructive pulmonary disease, unspecified: Secondary | ICD-10-CM | POA: Diagnosis not present

## 2023-04-05 DIAGNOSIS — J9691 Respiratory failure, unspecified with hypoxia: Secondary | ICD-10-CM | POA: Diagnosis not present

## 2023-04-05 DIAGNOSIS — M1611 Unilateral primary osteoarthritis, right hip: Secondary | ICD-10-CM | POA: Diagnosis not present

## 2023-04-05 DIAGNOSIS — M6282 Rhabdomyolysis: Secondary | ICD-10-CM | POA: Diagnosis not present

## 2023-04-05 DIAGNOSIS — F32A Depression, unspecified: Secondary | ICD-10-CM | POA: Diagnosis not present

## 2023-04-05 DIAGNOSIS — J309 Allergic rhinitis, unspecified: Secondary | ICD-10-CM | POA: Diagnosis not present

## 2023-04-05 DIAGNOSIS — G9341 Metabolic encephalopathy: Secondary | ICD-10-CM | POA: Diagnosis not present

## 2023-04-05 DIAGNOSIS — G4700 Insomnia, unspecified: Secondary | ICD-10-CM | POA: Diagnosis not present

## 2023-04-05 DIAGNOSIS — F419 Anxiety disorder, unspecified: Secondary | ICD-10-CM | POA: Diagnosis not present

## 2023-04-05 DIAGNOSIS — I610 Nontraumatic intracerebral hemorrhage in hemisphere, subcortical: Secondary | ICD-10-CM | POA: Diagnosis not present

## 2023-04-05 DIAGNOSIS — E7800 Pure hypercholesterolemia, unspecified: Secondary | ICD-10-CM | POA: Diagnosis not present

## 2023-04-05 DIAGNOSIS — M1711 Unilateral primary osteoarthritis, right knee: Secondary | ICD-10-CM | POA: Diagnosis not present

## 2023-04-05 DIAGNOSIS — E119 Type 2 diabetes mellitus without complications: Secondary | ICD-10-CM | POA: Diagnosis not present

## 2023-04-05 DIAGNOSIS — K5900 Constipation, unspecified: Secondary | ICD-10-CM | POA: Diagnosis not present

## 2023-04-05 DIAGNOSIS — I10 Essential (primary) hypertension: Secondary | ICD-10-CM | POA: Diagnosis not present

## 2023-04-12 DIAGNOSIS — K5900 Constipation, unspecified: Secondary | ICD-10-CM | POA: Diagnosis not present

## 2023-04-12 DIAGNOSIS — M6282 Rhabdomyolysis: Secondary | ICD-10-CM | POA: Diagnosis not present

## 2023-04-12 DIAGNOSIS — M1711 Unilateral primary osteoarthritis, right knee: Secondary | ICD-10-CM | POA: Diagnosis not present

## 2023-04-12 DIAGNOSIS — E7800 Pure hypercholesterolemia, unspecified: Secondary | ICD-10-CM | POA: Diagnosis not present

## 2023-04-12 DIAGNOSIS — I610 Nontraumatic intracerebral hemorrhage in hemisphere, subcortical: Secondary | ICD-10-CM | POA: Diagnosis not present

## 2023-04-12 DIAGNOSIS — G9341 Metabolic encephalopathy: Secondary | ICD-10-CM | POA: Diagnosis not present

## 2023-04-12 DIAGNOSIS — E119 Type 2 diabetes mellitus without complications: Secondary | ICD-10-CM | POA: Diagnosis not present

## 2023-04-12 DIAGNOSIS — M1611 Unilateral primary osteoarthritis, right hip: Secondary | ICD-10-CM | POA: Diagnosis not present

## 2023-04-12 DIAGNOSIS — J449 Chronic obstructive pulmonary disease, unspecified: Secondary | ICD-10-CM | POA: Diagnosis not present

## 2023-04-12 DIAGNOSIS — J9691 Respiratory failure, unspecified with hypoxia: Secondary | ICD-10-CM | POA: Diagnosis not present

## 2023-04-12 DIAGNOSIS — G4700 Insomnia, unspecified: Secondary | ICD-10-CM | POA: Diagnosis not present

## 2023-04-12 DIAGNOSIS — J309 Allergic rhinitis, unspecified: Secondary | ICD-10-CM | POA: Diagnosis not present

## 2023-04-12 DIAGNOSIS — F32A Depression, unspecified: Secondary | ICD-10-CM | POA: Diagnosis not present

## 2023-04-12 DIAGNOSIS — I10 Essential (primary) hypertension: Secondary | ICD-10-CM | POA: Diagnosis not present

## 2023-04-12 DIAGNOSIS — E559 Vitamin D deficiency, unspecified: Secondary | ICD-10-CM | POA: Diagnosis not present

## 2023-04-12 DIAGNOSIS — R1312 Dysphagia, oropharyngeal phase: Secondary | ICD-10-CM | POA: Diagnosis not present

## 2023-04-12 DIAGNOSIS — R54 Age-related physical debility: Secondary | ICD-10-CM | POA: Diagnosis not present

## 2023-04-12 DIAGNOSIS — F419 Anxiety disorder, unspecified: Secondary | ICD-10-CM | POA: Diagnosis not present

## 2023-04-14 DIAGNOSIS — M1711 Unilateral primary osteoarthritis, right knee: Secondary | ICD-10-CM | POA: Diagnosis not present

## 2023-04-14 DIAGNOSIS — J449 Chronic obstructive pulmonary disease, unspecified: Secondary | ICD-10-CM | POA: Diagnosis not present

## 2023-04-14 DIAGNOSIS — M1611 Unilateral primary osteoarthritis, right hip: Secondary | ICD-10-CM | POA: Diagnosis not present

## 2023-04-14 DIAGNOSIS — I10 Essential (primary) hypertension: Secondary | ICD-10-CM | POA: Diagnosis not present

## 2023-04-21 DIAGNOSIS — Z9181 History of falling: Secondary | ICD-10-CM | POA: Diagnosis not present

## 2023-04-21 DIAGNOSIS — G4700 Insomnia, unspecified: Secondary | ICD-10-CM | POA: Diagnosis not present

## 2023-04-21 DIAGNOSIS — E119 Type 2 diabetes mellitus without complications: Secondary | ICD-10-CM | POA: Diagnosis not present

## 2023-04-21 DIAGNOSIS — Z7984 Long term (current) use of oral hypoglycemic drugs: Secondary | ICD-10-CM | POA: Diagnosis not present

## 2023-04-21 DIAGNOSIS — E559 Vitamin D deficiency, unspecified: Secondary | ICD-10-CM | POA: Diagnosis not present

## 2023-04-21 DIAGNOSIS — R339 Retention of urine, unspecified: Secondary | ICD-10-CM | POA: Diagnosis not present

## 2023-04-21 DIAGNOSIS — F32A Depression, unspecified: Secondary | ICD-10-CM | POA: Diagnosis not present

## 2023-04-21 DIAGNOSIS — J309 Allergic rhinitis, unspecified: Secondary | ICD-10-CM | POA: Diagnosis not present

## 2023-04-21 DIAGNOSIS — E7800 Pure hypercholesterolemia, unspecified: Secondary | ICD-10-CM | POA: Diagnosis not present

## 2023-04-21 DIAGNOSIS — M1711 Unilateral primary osteoarthritis, right knee: Secondary | ICD-10-CM | POA: Diagnosis not present

## 2023-04-21 DIAGNOSIS — F419 Anxiety disorder, unspecified: Secondary | ICD-10-CM | POA: Diagnosis not present

## 2023-04-21 DIAGNOSIS — Z96642 Presence of left artificial hip joint: Secondary | ICD-10-CM | POA: Diagnosis not present

## 2023-04-21 DIAGNOSIS — Z87440 Personal history of urinary (tract) infections: Secondary | ICD-10-CM | POA: Diagnosis not present

## 2023-04-21 DIAGNOSIS — J449 Chronic obstructive pulmonary disease, unspecified: Secondary | ICD-10-CM | POA: Diagnosis not present

## 2023-04-21 DIAGNOSIS — I10 Essential (primary) hypertension: Secondary | ICD-10-CM | POA: Diagnosis not present

## 2023-04-21 DIAGNOSIS — M1611 Unilateral primary osteoarthritis, right hip: Secondary | ICD-10-CM | POA: Diagnosis not present

## 2023-04-21 DIAGNOSIS — Z7951 Long term (current) use of inhaled steroids: Secondary | ICD-10-CM | POA: Diagnosis not present

## 2023-04-23 DIAGNOSIS — F32A Depression, unspecified: Secondary | ICD-10-CM | POA: Diagnosis not present

## 2023-04-23 DIAGNOSIS — M1711 Unilateral primary osteoarthritis, right knee: Secondary | ICD-10-CM | POA: Diagnosis not present

## 2023-04-23 DIAGNOSIS — Z7984 Long term (current) use of oral hypoglycemic drugs: Secondary | ICD-10-CM | POA: Diagnosis not present

## 2023-04-23 DIAGNOSIS — E7800 Pure hypercholesterolemia, unspecified: Secondary | ICD-10-CM | POA: Diagnosis not present

## 2023-04-23 DIAGNOSIS — J309 Allergic rhinitis, unspecified: Secondary | ICD-10-CM | POA: Diagnosis not present

## 2023-04-23 DIAGNOSIS — F419 Anxiety disorder, unspecified: Secondary | ICD-10-CM | POA: Diagnosis not present

## 2023-04-23 DIAGNOSIS — E119 Type 2 diabetes mellitus without complications: Secondary | ICD-10-CM | POA: Diagnosis not present

## 2023-04-23 DIAGNOSIS — E559 Vitamin D deficiency, unspecified: Secondary | ICD-10-CM | POA: Diagnosis not present

## 2023-04-23 DIAGNOSIS — I10 Essential (primary) hypertension: Secondary | ICD-10-CM | POA: Diagnosis not present

## 2023-04-23 DIAGNOSIS — M1611 Unilateral primary osteoarthritis, right hip: Secondary | ICD-10-CM | POA: Diagnosis not present

## 2023-04-23 DIAGNOSIS — R339 Retention of urine, unspecified: Secondary | ICD-10-CM | POA: Diagnosis not present

## 2023-04-23 DIAGNOSIS — J449 Chronic obstructive pulmonary disease, unspecified: Secondary | ICD-10-CM | POA: Diagnosis not present

## 2023-04-23 DIAGNOSIS — Z96642 Presence of left artificial hip joint: Secondary | ICD-10-CM | POA: Diagnosis not present

## 2023-04-23 DIAGNOSIS — Z87440 Personal history of urinary (tract) infections: Secondary | ICD-10-CM | POA: Diagnosis not present

## 2023-04-23 DIAGNOSIS — G4700 Insomnia, unspecified: Secondary | ICD-10-CM | POA: Diagnosis not present

## 2023-04-23 DIAGNOSIS — Z7951 Long term (current) use of inhaled steroids: Secondary | ICD-10-CM | POA: Diagnosis not present

## 2023-04-28 DIAGNOSIS — F419 Anxiety disorder, unspecified: Secondary | ICD-10-CM | POA: Diagnosis not present

## 2023-04-28 DIAGNOSIS — M1711 Unilateral primary osteoarthritis, right knee: Secondary | ICD-10-CM | POA: Diagnosis not present

## 2023-04-28 DIAGNOSIS — J309 Allergic rhinitis, unspecified: Secondary | ICD-10-CM | POA: Diagnosis not present

## 2023-04-28 DIAGNOSIS — Z87440 Personal history of urinary (tract) infections: Secondary | ICD-10-CM | POA: Diagnosis not present

## 2023-04-28 DIAGNOSIS — F32A Depression, unspecified: Secondary | ICD-10-CM | POA: Diagnosis not present

## 2023-04-28 DIAGNOSIS — E7800 Pure hypercholesterolemia, unspecified: Secondary | ICD-10-CM | POA: Diagnosis not present

## 2023-04-28 DIAGNOSIS — E119 Type 2 diabetes mellitus without complications: Secondary | ICD-10-CM | POA: Diagnosis not present

## 2023-04-28 DIAGNOSIS — J449 Chronic obstructive pulmonary disease, unspecified: Secondary | ICD-10-CM | POA: Diagnosis not present

## 2023-04-28 DIAGNOSIS — Z7951 Long term (current) use of inhaled steroids: Secondary | ICD-10-CM | POA: Diagnosis not present

## 2023-04-28 DIAGNOSIS — I10 Essential (primary) hypertension: Secondary | ICD-10-CM | POA: Diagnosis not present

## 2023-04-28 DIAGNOSIS — R339 Retention of urine, unspecified: Secondary | ICD-10-CM | POA: Diagnosis not present

## 2023-04-28 DIAGNOSIS — Z7984 Long term (current) use of oral hypoglycemic drugs: Secondary | ICD-10-CM | POA: Diagnosis not present

## 2023-04-28 DIAGNOSIS — Z96642 Presence of left artificial hip joint: Secondary | ICD-10-CM | POA: Diagnosis not present

## 2023-04-28 DIAGNOSIS — M1611 Unilateral primary osteoarthritis, right hip: Secondary | ICD-10-CM | POA: Diagnosis not present

## 2023-04-28 DIAGNOSIS — G4700 Insomnia, unspecified: Secondary | ICD-10-CM | POA: Diagnosis not present

## 2023-04-28 DIAGNOSIS — E559 Vitamin D deficiency, unspecified: Secondary | ICD-10-CM | POA: Diagnosis not present

## 2023-04-30 DIAGNOSIS — E559 Vitamin D deficiency, unspecified: Secondary | ICD-10-CM | POA: Diagnosis not present

## 2023-04-30 DIAGNOSIS — J449 Chronic obstructive pulmonary disease, unspecified: Secondary | ICD-10-CM | POA: Diagnosis not present

## 2023-04-30 DIAGNOSIS — E7800 Pure hypercholesterolemia, unspecified: Secondary | ICD-10-CM | POA: Diagnosis not present

## 2023-04-30 DIAGNOSIS — Z87440 Personal history of urinary (tract) infections: Secondary | ICD-10-CM | POA: Diagnosis not present

## 2023-04-30 DIAGNOSIS — M1611 Unilateral primary osteoarthritis, right hip: Secondary | ICD-10-CM | POA: Diagnosis not present

## 2023-04-30 DIAGNOSIS — Z96642 Presence of left artificial hip joint: Secondary | ICD-10-CM | POA: Diagnosis not present

## 2023-04-30 DIAGNOSIS — R339 Retention of urine, unspecified: Secondary | ICD-10-CM | POA: Diagnosis not present

## 2023-04-30 DIAGNOSIS — J309 Allergic rhinitis, unspecified: Secondary | ICD-10-CM | POA: Diagnosis not present

## 2023-04-30 DIAGNOSIS — G4700 Insomnia, unspecified: Secondary | ICD-10-CM | POA: Diagnosis not present

## 2023-04-30 DIAGNOSIS — Z7951 Long term (current) use of inhaled steroids: Secondary | ICD-10-CM | POA: Diagnosis not present

## 2023-04-30 DIAGNOSIS — Z7984 Long term (current) use of oral hypoglycemic drugs: Secondary | ICD-10-CM | POA: Diagnosis not present

## 2023-04-30 DIAGNOSIS — M1711 Unilateral primary osteoarthritis, right knee: Secondary | ICD-10-CM | POA: Diagnosis not present

## 2023-04-30 DIAGNOSIS — I10 Essential (primary) hypertension: Secondary | ICD-10-CM | POA: Diagnosis not present

## 2023-04-30 DIAGNOSIS — F419 Anxiety disorder, unspecified: Secondary | ICD-10-CM | POA: Diagnosis not present

## 2023-04-30 DIAGNOSIS — F32A Depression, unspecified: Secondary | ICD-10-CM | POA: Diagnosis not present

## 2023-04-30 DIAGNOSIS — E119 Type 2 diabetes mellitus without complications: Secondary | ICD-10-CM | POA: Diagnosis not present

## 2023-05-05 DIAGNOSIS — J449 Chronic obstructive pulmonary disease, unspecified: Secondary | ICD-10-CM | POA: Diagnosis not present

## 2023-05-05 DIAGNOSIS — R339 Retention of urine, unspecified: Secondary | ICD-10-CM | POA: Diagnosis not present

## 2023-05-05 DIAGNOSIS — M1611 Unilateral primary osteoarthritis, right hip: Secondary | ICD-10-CM | POA: Diagnosis not present

## 2023-05-05 DIAGNOSIS — I10 Essential (primary) hypertension: Secondary | ICD-10-CM | POA: Diagnosis not present

## 2023-05-05 DIAGNOSIS — Z7951 Long term (current) use of inhaled steroids: Secondary | ICD-10-CM | POA: Diagnosis not present

## 2023-05-05 DIAGNOSIS — G4700 Insomnia, unspecified: Secondary | ICD-10-CM | POA: Diagnosis not present

## 2023-05-05 DIAGNOSIS — Z7984 Long term (current) use of oral hypoglycemic drugs: Secondary | ICD-10-CM | POA: Diagnosis not present

## 2023-05-05 DIAGNOSIS — E119 Type 2 diabetes mellitus without complications: Secondary | ICD-10-CM | POA: Diagnosis not present

## 2023-05-05 DIAGNOSIS — Z87440 Personal history of urinary (tract) infections: Secondary | ICD-10-CM | POA: Diagnosis not present

## 2023-05-05 DIAGNOSIS — E559 Vitamin D deficiency, unspecified: Secondary | ICD-10-CM | POA: Diagnosis not present

## 2023-05-05 DIAGNOSIS — F32A Depression, unspecified: Secondary | ICD-10-CM | POA: Diagnosis not present

## 2023-05-05 DIAGNOSIS — M1711 Unilateral primary osteoarthritis, right knee: Secondary | ICD-10-CM | POA: Diagnosis not present

## 2023-05-05 DIAGNOSIS — F419 Anxiety disorder, unspecified: Secondary | ICD-10-CM | POA: Diagnosis not present

## 2023-05-05 DIAGNOSIS — E7800 Pure hypercholesterolemia, unspecified: Secondary | ICD-10-CM | POA: Diagnosis not present

## 2023-05-05 DIAGNOSIS — Z96642 Presence of left artificial hip joint: Secondary | ICD-10-CM | POA: Diagnosis not present

## 2023-05-05 DIAGNOSIS — J309 Allergic rhinitis, unspecified: Secondary | ICD-10-CM | POA: Diagnosis not present

## 2023-05-07 DIAGNOSIS — R339 Retention of urine, unspecified: Secondary | ICD-10-CM | POA: Diagnosis not present

## 2023-05-07 DIAGNOSIS — J309 Allergic rhinitis, unspecified: Secondary | ICD-10-CM | POA: Diagnosis not present

## 2023-05-07 DIAGNOSIS — Z7984 Long term (current) use of oral hypoglycemic drugs: Secondary | ICD-10-CM | POA: Diagnosis not present

## 2023-05-07 DIAGNOSIS — G4700 Insomnia, unspecified: Secondary | ICD-10-CM | POA: Diagnosis not present

## 2023-05-07 DIAGNOSIS — E119 Type 2 diabetes mellitus without complications: Secondary | ICD-10-CM | POA: Diagnosis not present

## 2023-05-07 DIAGNOSIS — Z87440 Personal history of urinary (tract) infections: Secondary | ICD-10-CM | POA: Diagnosis not present

## 2023-05-07 DIAGNOSIS — E7800 Pure hypercholesterolemia, unspecified: Secondary | ICD-10-CM | POA: Diagnosis not present

## 2023-05-07 DIAGNOSIS — E559 Vitamin D deficiency, unspecified: Secondary | ICD-10-CM | POA: Diagnosis not present

## 2023-05-07 DIAGNOSIS — J449 Chronic obstructive pulmonary disease, unspecified: Secondary | ICD-10-CM | POA: Diagnosis not present

## 2023-05-07 DIAGNOSIS — I10 Essential (primary) hypertension: Secondary | ICD-10-CM | POA: Diagnosis not present

## 2023-05-07 DIAGNOSIS — Z7951 Long term (current) use of inhaled steroids: Secondary | ICD-10-CM | POA: Diagnosis not present

## 2023-05-07 DIAGNOSIS — M1611 Unilateral primary osteoarthritis, right hip: Secondary | ICD-10-CM | POA: Diagnosis not present

## 2023-05-07 DIAGNOSIS — Z96642 Presence of left artificial hip joint: Secondary | ICD-10-CM | POA: Diagnosis not present

## 2023-05-07 DIAGNOSIS — F32A Depression, unspecified: Secondary | ICD-10-CM | POA: Diagnosis not present

## 2023-05-07 DIAGNOSIS — F419 Anxiety disorder, unspecified: Secondary | ICD-10-CM | POA: Diagnosis not present

## 2023-05-07 DIAGNOSIS — M1711 Unilateral primary osteoarthritis, right knee: Secondary | ICD-10-CM | POA: Diagnosis not present

## 2023-05-10 DIAGNOSIS — F32A Depression, unspecified: Secondary | ICD-10-CM | POA: Diagnosis not present

## 2023-05-10 DIAGNOSIS — Z7951 Long term (current) use of inhaled steroids: Secondary | ICD-10-CM | POA: Diagnosis not present

## 2023-05-10 DIAGNOSIS — Z96642 Presence of left artificial hip joint: Secondary | ICD-10-CM | POA: Diagnosis not present

## 2023-05-10 DIAGNOSIS — I10 Essential (primary) hypertension: Secondary | ICD-10-CM | POA: Diagnosis not present

## 2023-05-10 DIAGNOSIS — E7800 Pure hypercholesterolemia, unspecified: Secondary | ICD-10-CM | POA: Diagnosis not present

## 2023-05-10 DIAGNOSIS — G4700 Insomnia, unspecified: Secondary | ICD-10-CM | POA: Diagnosis not present

## 2023-05-10 DIAGNOSIS — M1711 Unilateral primary osteoarthritis, right knee: Secondary | ICD-10-CM | POA: Diagnosis not present

## 2023-05-10 DIAGNOSIS — J449 Chronic obstructive pulmonary disease, unspecified: Secondary | ICD-10-CM | POA: Diagnosis not present

## 2023-05-10 DIAGNOSIS — F419 Anxiety disorder, unspecified: Secondary | ICD-10-CM | POA: Diagnosis not present

## 2023-05-10 DIAGNOSIS — E559 Vitamin D deficiency, unspecified: Secondary | ICD-10-CM | POA: Diagnosis not present

## 2023-05-10 DIAGNOSIS — J309 Allergic rhinitis, unspecified: Secondary | ICD-10-CM | POA: Diagnosis not present

## 2023-05-10 DIAGNOSIS — R339 Retention of urine, unspecified: Secondary | ICD-10-CM | POA: Diagnosis not present

## 2023-05-10 DIAGNOSIS — Z87440 Personal history of urinary (tract) infections: Secondary | ICD-10-CM | POA: Diagnosis not present

## 2023-05-10 DIAGNOSIS — E119 Type 2 diabetes mellitus without complications: Secondary | ICD-10-CM | POA: Diagnosis not present

## 2023-05-10 DIAGNOSIS — M1611 Unilateral primary osteoarthritis, right hip: Secondary | ICD-10-CM | POA: Diagnosis not present

## 2023-05-10 DIAGNOSIS — Z7984 Long term (current) use of oral hypoglycemic drugs: Secondary | ICD-10-CM | POA: Diagnosis not present

## 2023-05-13 DIAGNOSIS — J449 Chronic obstructive pulmonary disease, unspecified: Secondary | ICD-10-CM | POA: Diagnosis not present

## 2023-05-13 DIAGNOSIS — R1312 Dysphagia, oropharyngeal phase: Secondary | ICD-10-CM | POA: Diagnosis not present

## 2023-05-13 DIAGNOSIS — R54 Age-related physical debility: Secondary | ICD-10-CM | POA: Diagnosis not present

## 2023-05-13 DIAGNOSIS — G9341 Metabolic encephalopathy: Secondary | ICD-10-CM | POA: Diagnosis not present

## 2023-05-18 DIAGNOSIS — J309 Allergic rhinitis, unspecified: Secondary | ICD-10-CM | POA: Diagnosis not present

## 2023-05-18 DIAGNOSIS — Z87440 Personal history of urinary (tract) infections: Secondary | ICD-10-CM | POA: Diagnosis not present

## 2023-05-18 DIAGNOSIS — R339 Retention of urine, unspecified: Secondary | ICD-10-CM | POA: Diagnosis not present

## 2023-05-18 DIAGNOSIS — I10 Essential (primary) hypertension: Secondary | ICD-10-CM | POA: Diagnosis not present

## 2023-05-18 DIAGNOSIS — M1611 Unilateral primary osteoarthritis, right hip: Secondary | ICD-10-CM | POA: Diagnosis not present

## 2023-05-18 DIAGNOSIS — E559 Vitamin D deficiency, unspecified: Secondary | ICD-10-CM | POA: Diagnosis not present

## 2023-05-18 DIAGNOSIS — J449 Chronic obstructive pulmonary disease, unspecified: Secondary | ICD-10-CM | POA: Diagnosis not present

## 2023-05-18 DIAGNOSIS — Z9181 History of falling: Secondary | ICD-10-CM | POA: Diagnosis not present

## 2023-05-18 DIAGNOSIS — F32A Depression, unspecified: Secondary | ICD-10-CM | POA: Diagnosis not present

## 2023-05-18 DIAGNOSIS — E7800 Pure hypercholesterolemia, unspecified: Secondary | ICD-10-CM | POA: Diagnosis not present

## 2023-05-18 DIAGNOSIS — Z96642 Presence of left artificial hip joint: Secondary | ICD-10-CM | POA: Diagnosis not present

## 2023-05-18 DIAGNOSIS — G4700 Insomnia, unspecified: Secondary | ICD-10-CM | POA: Diagnosis not present

## 2023-05-18 DIAGNOSIS — Z7951 Long term (current) use of inhaled steroids: Secondary | ICD-10-CM | POA: Diagnosis not present

## 2023-05-18 DIAGNOSIS — M1711 Unilateral primary osteoarthritis, right knee: Secondary | ICD-10-CM | POA: Diagnosis not present

## 2023-05-18 DIAGNOSIS — E119 Type 2 diabetes mellitus without complications: Secondary | ICD-10-CM | POA: Diagnosis not present

## 2023-05-18 DIAGNOSIS — Z7984 Long term (current) use of oral hypoglycemic drugs: Secondary | ICD-10-CM | POA: Diagnosis not present

## 2023-05-18 DIAGNOSIS — F419 Anxiety disorder, unspecified: Secondary | ICD-10-CM | POA: Diagnosis not present

## 2023-05-21 DIAGNOSIS — E119 Type 2 diabetes mellitus without complications: Secondary | ICD-10-CM | POA: Diagnosis not present

## 2023-05-21 DIAGNOSIS — J309 Allergic rhinitis, unspecified: Secondary | ICD-10-CM | POA: Diagnosis not present

## 2023-05-21 DIAGNOSIS — Z87440 Personal history of urinary (tract) infections: Secondary | ICD-10-CM | POA: Diagnosis not present

## 2023-05-21 DIAGNOSIS — Z7984 Long term (current) use of oral hypoglycemic drugs: Secondary | ICD-10-CM | POA: Diagnosis not present

## 2023-05-21 DIAGNOSIS — Z96642 Presence of left artificial hip joint: Secondary | ICD-10-CM | POA: Diagnosis not present

## 2023-05-21 DIAGNOSIS — F419 Anxiety disorder, unspecified: Secondary | ICD-10-CM | POA: Diagnosis not present

## 2023-05-21 DIAGNOSIS — E559 Vitamin D deficiency, unspecified: Secondary | ICD-10-CM | POA: Diagnosis not present

## 2023-05-21 DIAGNOSIS — I10 Essential (primary) hypertension: Secondary | ICD-10-CM | POA: Diagnosis not present

## 2023-05-21 DIAGNOSIS — Z7951 Long term (current) use of inhaled steroids: Secondary | ICD-10-CM | POA: Diagnosis not present

## 2023-05-21 DIAGNOSIS — M1611 Unilateral primary osteoarthritis, right hip: Secondary | ICD-10-CM | POA: Diagnosis not present

## 2023-05-21 DIAGNOSIS — J449 Chronic obstructive pulmonary disease, unspecified: Secondary | ICD-10-CM | POA: Diagnosis not present

## 2023-05-21 DIAGNOSIS — M1711 Unilateral primary osteoarthritis, right knee: Secondary | ICD-10-CM | POA: Diagnosis not present

## 2023-05-21 DIAGNOSIS — E7800 Pure hypercholesterolemia, unspecified: Secondary | ICD-10-CM | POA: Diagnosis not present

## 2023-05-21 DIAGNOSIS — F32A Depression, unspecified: Secondary | ICD-10-CM | POA: Diagnosis not present

## 2023-05-21 DIAGNOSIS — G4700 Insomnia, unspecified: Secondary | ICD-10-CM | POA: Diagnosis not present

## 2023-05-21 DIAGNOSIS — R339 Retention of urine, unspecified: Secondary | ICD-10-CM | POA: Diagnosis not present

## 2023-05-27 DIAGNOSIS — J449 Chronic obstructive pulmonary disease, unspecified: Secondary | ICD-10-CM | POA: Diagnosis not present

## 2023-05-27 DIAGNOSIS — E119 Type 2 diabetes mellitus without complications: Secondary | ICD-10-CM | POA: Diagnosis not present

## 2023-05-27 DIAGNOSIS — R339 Retention of urine, unspecified: Secondary | ICD-10-CM | POA: Diagnosis not present

## 2023-05-27 DIAGNOSIS — Z7951 Long term (current) use of inhaled steroids: Secondary | ICD-10-CM | POA: Diagnosis not present

## 2023-05-27 DIAGNOSIS — F32A Depression, unspecified: Secondary | ICD-10-CM | POA: Diagnosis not present

## 2023-05-27 DIAGNOSIS — Z7984 Long term (current) use of oral hypoglycemic drugs: Secondary | ICD-10-CM | POA: Diagnosis not present

## 2023-05-27 DIAGNOSIS — E559 Vitamin D deficiency, unspecified: Secondary | ICD-10-CM | POA: Diagnosis not present

## 2023-05-27 DIAGNOSIS — Z96642 Presence of left artificial hip joint: Secondary | ICD-10-CM | POA: Diagnosis not present

## 2023-05-27 DIAGNOSIS — J309 Allergic rhinitis, unspecified: Secondary | ICD-10-CM | POA: Diagnosis not present

## 2023-05-27 DIAGNOSIS — F419 Anxiety disorder, unspecified: Secondary | ICD-10-CM | POA: Diagnosis not present

## 2023-05-27 DIAGNOSIS — Z87440 Personal history of urinary (tract) infections: Secondary | ICD-10-CM | POA: Diagnosis not present

## 2023-05-27 DIAGNOSIS — E7800 Pure hypercholesterolemia, unspecified: Secondary | ICD-10-CM | POA: Diagnosis not present

## 2023-05-27 DIAGNOSIS — M1611 Unilateral primary osteoarthritis, right hip: Secondary | ICD-10-CM | POA: Diagnosis not present

## 2023-05-27 DIAGNOSIS — M1711 Unilateral primary osteoarthritis, right knee: Secondary | ICD-10-CM | POA: Diagnosis not present

## 2023-05-27 DIAGNOSIS — G4700 Insomnia, unspecified: Secondary | ICD-10-CM | POA: Diagnosis not present

## 2023-05-27 DIAGNOSIS — I10 Essential (primary) hypertension: Secondary | ICD-10-CM | POA: Diagnosis not present

## 2023-06-03 DIAGNOSIS — J309 Allergic rhinitis, unspecified: Secondary | ICD-10-CM | POA: Diagnosis not present

## 2023-06-03 DIAGNOSIS — G4700 Insomnia, unspecified: Secondary | ICD-10-CM | POA: Diagnosis not present

## 2023-06-03 DIAGNOSIS — E7800 Pure hypercholesterolemia, unspecified: Secondary | ICD-10-CM | POA: Diagnosis not present

## 2023-06-03 DIAGNOSIS — E119 Type 2 diabetes mellitus without complications: Secondary | ICD-10-CM | POA: Diagnosis not present

## 2023-06-03 DIAGNOSIS — Z7984 Long term (current) use of oral hypoglycemic drugs: Secondary | ICD-10-CM | POA: Diagnosis not present

## 2023-06-03 DIAGNOSIS — J449 Chronic obstructive pulmonary disease, unspecified: Secondary | ICD-10-CM | POA: Diagnosis not present

## 2023-06-03 DIAGNOSIS — Z7951 Long term (current) use of inhaled steroids: Secondary | ICD-10-CM | POA: Diagnosis not present

## 2023-06-03 DIAGNOSIS — Z87440 Personal history of urinary (tract) infections: Secondary | ICD-10-CM | POA: Diagnosis not present

## 2023-06-03 DIAGNOSIS — F419 Anxiety disorder, unspecified: Secondary | ICD-10-CM | POA: Diagnosis not present

## 2023-06-03 DIAGNOSIS — F32A Depression, unspecified: Secondary | ICD-10-CM | POA: Diagnosis not present

## 2023-06-03 DIAGNOSIS — M1711 Unilateral primary osteoarthritis, right knee: Secondary | ICD-10-CM | POA: Diagnosis not present

## 2023-06-03 DIAGNOSIS — R339 Retention of urine, unspecified: Secondary | ICD-10-CM | POA: Diagnosis not present

## 2023-06-03 DIAGNOSIS — Z96642 Presence of left artificial hip joint: Secondary | ICD-10-CM | POA: Diagnosis not present

## 2023-06-03 DIAGNOSIS — M1611 Unilateral primary osteoarthritis, right hip: Secondary | ICD-10-CM | POA: Diagnosis not present

## 2023-06-03 DIAGNOSIS — I10 Essential (primary) hypertension: Secondary | ICD-10-CM | POA: Diagnosis not present

## 2023-06-03 DIAGNOSIS — E559 Vitamin D deficiency, unspecified: Secondary | ICD-10-CM | POA: Diagnosis not present

## 2023-06-11 DIAGNOSIS — J449 Chronic obstructive pulmonary disease, unspecified: Secondary | ICD-10-CM | POA: Diagnosis not present

## 2023-06-11 DIAGNOSIS — R54 Age-related physical debility: Secondary | ICD-10-CM | POA: Diagnosis not present

## 2023-06-11 DIAGNOSIS — G9341 Metabolic encephalopathy: Secondary | ICD-10-CM | POA: Diagnosis not present

## 2023-06-11 DIAGNOSIS — R1312 Dysphagia, oropharyngeal phase: Secondary | ICD-10-CM | POA: Diagnosis not present

## 2023-06-15 DIAGNOSIS — E1169 Type 2 diabetes mellitus with other specified complication: Secondary | ICD-10-CM | POA: Diagnosis not present

## 2023-06-15 DIAGNOSIS — F419 Anxiety disorder, unspecified: Secondary | ICD-10-CM | POA: Diagnosis not present

## 2023-06-15 DIAGNOSIS — I10 Essential (primary) hypertension: Secondary | ICD-10-CM | POA: Diagnosis not present

## 2023-06-15 DIAGNOSIS — F322 Major depressive disorder, single episode, severe without psychotic features: Secondary | ICD-10-CM | POA: Diagnosis not present

## 2023-06-30 DIAGNOSIS — J449 Chronic obstructive pulmonary disease, unspecified: Secondary | ICD-10-CM | POA: Diagnosis not present

## 2023-06-30 DIAGNOSIS — G4700 Insomnia, unspecified: Secondary | ICD-10-CM | POA: Diagnosis not present

## 2023-06-30 DIAGNOSIS — Z7951 Long term (current) use of inhaled steroids: Secondary | ICD-10-CM | POA: Diagnosis not present

## 2023-06-30 DIAGNOSIS — E1151 Type 2 diabetes mellitus with diabetic peripheral angiopathy without gangrene: Secondary | ICD-10-CM | POA: Diagnosis not present

## 2023-06-30 DIAGNOSIS — E1159 Type 2 diabetes mellitus with other circulatory complications: Secondary | ICD-10-CM | POA: Diagnosis not present

## 2023-06-30 DIAGNOSIS — M21062 Valgus deformity, not elsewhere classified, left knee: Secondary | ICD-10-CM | POA: Diagnosis not present

## 2023-06-30 DIAGNOSIS — I700 Atherosclerosis of aorta: Secondary | ICD-10-CM | POA: Diagnosis not present

## 2023-06-30 DIAGNOSIS — E7800 Pure hypercholesterolemia, unspecified: Secondary | ICD-10-CM | POA: Diagnosis not present

## 2023-06-30 DIAGNOSIS — M1712 Unilateral primary osteoarthritis, left knee: Secondary | ICD-10-CM | POA: Diagnosis not present

## 2023-06-30 DIAGNOSIS — E1142 Type 2 diabetes mellitus with diabetic polyneuropathy: Secondary | ICD-10-CM | POA: Diagnosis not present

## 2023-06-30 DIAGNOSIS — Z9181 History of falling: Secondary | ICD-10-CM | POA: Diagnosis not present

## 2023-06-30 DIAGNOSIS — E1169 Type 2 diabetes mellitus with other specified complication: Secondary | ICD-10-CM | POA: Diagnosis not present

## 2023-06-30 DIAGNOSIS — Z7984 Long term (current) use of oral hypoglycemic drugs: Secondary | ICD-10-CM | POA: Diagnosis not present

## 2023-06-30 DIAGNOSIS — Z79899 Other long term (current) drug therapy: Secondary | ICD-10-CM | POA: Diagnosis not present

## 2023-06-30 DIAGNOSIS — F419 Anxiety disorder, unspecified: Secondary | ICD-10-CM | POA: Diagnosis not present

## 2023-06-30 DIAGNOSIS — I152 Hypertension secondary to endocrine disorders: Secondary | ICD-10-CM | POA: Diagnosis not present

## 2023-07-05 DIAGNOSIS — G4700 Insomnia, unspecified: Secondary | ICD-10-CM | POA: Diagnosis not present

## 2023-07-05 DIAGNOSIS — I700 Atherosclerosis of aorta: Secondary | ICD-10-CM | POA: Diagnosis not present

## 2023-07-05 DIAGNOSIS — E1159 Type 2 diabetes mellitus with other circulatory complications: Secondary | ICD-10-CM | POA: Diagnosis not present

## 2023-07-05 DIAGNOSIS — Z79899 Other long term (current) drug therapy: Secondary | ICD-10-CM | POA: Diagnosis not present

## 2023-07-05 DIAGNOSIS — E1142 Type 2 diabetes mellitus with diabetic polyneuropathy: Secondary | ICD-10-CM | POA: Diagnosis not present

## 2023-07-05 DIAGNOSIS — I152 Hypertension secondary to endocrine disorders: Secondary | ICD-10-CM | POA: Diagnosis not present

## 2023-07-05 DIAGNOSIS — M21062 Valgus deformity, not elsewhere classified, left knee: Secondary | ICD-10-CM | POA: Diagnosis not present

## 2023-07-05 DIAGNOSIS — E7800 Pure hypercholesterolemia, unspecified: Secondary | ICD-10-CM | POA: Diagnosis not present

## 2023-07-05 DIAGNOSIS — E1151 Type 2 diabetes mellitus with diabetic peripheral angiopathy without gangrene: Secondary | ICD-10-CM | POA: Diagnosis not present

## 2023-07-05 DIAGNOSIS — Z9181 History of falling: Secondary | ICD-10-CM | POA: Diagnosis not present

## 2023-07-05 DIAGNOSIS — J449 Chronic obstructive pulmonary disease, unspecified: Secondary | ICD-10-CM | POA: Diagnosis not present

## 2023-07-05 DIAGNOSIS — Z7984 Long term (current) use of oral hypoglycemic drugs: Secondary | ICD-10-CM | POA: Diagnosis not present

## 2023-07-05 DIAGNOSIS — M1712 Unilateral primary osteoarthritis, left knee: Secondary | ICD-10-CM | POA: Diagnosis not present

## 2023-07-05 DIAGNOSIS — F419 Anxiety disorder, unspecified: Secondary | ICD-10-CM | POA: Diagnosis not present

## 2023-07-05 DIAGNOSIS — E1169 Type 2 diabetes mellitus with other specified complication: Secondary | ICD-10-CM | POA: Diagnosis not present

## 2023-07-05 DIAGNOSIS — Z7951 Long term (current) use of inhaled steroids: Secondary | ICD-10-CM | POA: Diagnosis not present

## 2023-07-09 DIAGNOSIS — Z79899 Other long term (current) drug therapy: Secondary | ICD-10-CM | POA: Diagnosis not present

## 2023-07-09 DIAGNOSIS — I152 Hypertension secondary to endocrine disorders: Secondary | ICD-10-CM | POA: Diagnosis not present

## 2023-07-09 DIAGNOSIS — J449 Chronic obstructive pulmonary disease, unspecified: Secondary | ICD-10-CM | POA: Diagnosis not present

## 2023-07-09 DIAGNOSIS — E1169 Type 2 diabetes mellitus with other specified complication: Secondary | ICD-10-CM | POA: Diagnosis not present

## 2023-07-09 DIAGNOSIS — G9341 Metabolic encephalopathy: Secondary | ICD-10-CM | POA: Diagnosis not present

## 2023-07-09 DIAGNOSIS — E1159 Type 2 diabetes mellitus with other circulatory complications: Secondary | ICD-10-CM | POA: Diagnosis not present

## 2023-07-09 DIAGNOSIS — Z9181 History of falling: Secondary | ICD-10-CM | POA: Diagnosis not present

## 2023-07-09 DIAGNOSIS — M21062 Valgus deformity, not elsewhere classified, left knee: Secondary | ICD-10-CM | POA: Diagnosis not present

## 2023-07-09 DIAGNOSIS — R1312 Dysphagia, oropharyngeal phase: Secondary | ICD-10-CM | POA: Diagnosis not present

## 2023-07-09 DIAGNOSIS — E1142 Type 2 diabetes mellitus with diabetic polyneuropathy: Secondary | ICD-10-CM | POA: Diagnosis not present

## 2023-07-09 DIAGNOSIS — I700 Atherosclerosis of aorta: Secondary | ICD-10-CM | POA: Diagnosis not present

## 2023-07-09 DIAGNOSIS — Z7951 Long term (current) use of inhaled steroids: Secondary | ICD-10-CM | POA: Diagnosis not present

## 2023-07-09 DIAGNOSIS — E1151 Type 2 diabetes mellitus with diabetic peripheral angiopathy without gangrene: Secondary | ICD-10-CM | POA: Diagnosis not present

## 2023-07-09 DIAGNOSIS — Z7984 Long term (current) use of oral hypoglycemic drugs: Secondary | ICD-10-CM | POA: Diagnosis not present

## 2023-07-09 DIAGNOSIS — R54 Age-related physical debility: Secondary | ICD-10-CM | POA: Diagnosis not present

## 2023-07-09 DIAGNOSIS — E7800 Pure hypercholesterolemia, unspecified: Secondary | ICD-10-CM | POA: Diagnosis not present

## 2023-07-09 DIAGNOSIS — G4700 Insomnia, unspecified: Secondary | ICD-10-CM | POA: Diagnosis not present

## 2023-07-09 DIAGNOSIS — F419 Anxiety disorder, unspecified: Secondary | ICD-10-CM | POA: Diagnosis not present

## 2023-07-09 DIAGNOSIS — M1712 Unilateral primary osteoarthritis, left knee: Secondary | ICD-10-CM | POA: Diagnosis not present

## 2023-07-12 DIAGNOSIS — E1159 Type 2 diabetes mellitus with other circulatory complications: Secondary | ICD-10-CM | POA: Diagnosis not present

## 2023-07-12 DIAGNOSIS — F419 Anxiety disorder, unspecified: Secondary | ICD-10-CM | POA: Diagnosis not present

## 2023-07-12 DIAGNOSIS — Z7951 Long term (current) use of inhaled steroids: Secondary | ICD-10-CM | POA: Diagnosis not present

## 2023-07-12 DIAGNOSIS — M1712 Unilateral primary osteoarthritis, left knee: Secondary | ICD-10-CM | POA: Diagnosis not present

## 2023-07-12 DIAGNOSIS — I152 Hypertension secondary to endocrine disorders: Secondary | ICD-10-CM | POA: Diagnosis not present

## 2023-07-12 DIAGNOSIS — M21062 Valgus deformity, not elsewhere classified, left knee: Secondary | ICD-10-CM | POA: Diagnosis not present

## 2023-07-12 DIAGNOSIS — E7800 Pure hypercholesterolemia, unspecified: Secondary | ICD-10-CM | POA: Diagnosis not present

## 2023-07-12 DIAGNOSIS — Z9181 History of falling: Secondary | ICD-10-CM | POA: Diagnosis not present

## 2023-07-12 DIAGNOSIS — I700 Atherosclerosis of aorta: Secondary | ICD-10-CM | POA: Diagnosis not present

## 2023-07-12 DIAGNOSIS — Z7984 Long term (current) use of oral hypoglycemic drugs: Secondary | ICD-10-CM | POA: Diagnosis not present

## 2023-07-12 DIAGNOSIS — J449 Chronic obstructive pulmonary disease, unspecified: Secondary | ICD-10-CM | POA: Diagnosis not present

## 2023-07-12 DIAGNOSIS — E1151 Type 2 diabetes mellitus with diabetic peripheral angiopathy without gangrene: Secondary | ICD-10-CM | POA: Diagnosis not present

## 2023-07-12 DIAGNOSIS — E1169 Type 2 diabetes mellitus with other specified complication: Secondary | ICD-10-CM | POA: Diagnosis not present

## 2023-07-12 DIAGNOSIS — E1142 Type 2 diabetes mellitus with diabetic polyneuropathy: Secondary | ICD-10-CM | POA: Diagnosis not present

## 2023-07-12 DIAGNOSIS — Z79899 Other long term (current) drug therapy: Secondary | ICD-10-CM | POA: Diagnosis not present

## 2023-07-12 DIAGNOSIS — G4700 Insomnia, unspecified: Secondary | ICD-10-CM | POA: Diagnosis not present

## 2023-07-14 DIAGNOSIS — Z9181 History of falling: Secondary | ICD-10-CM | POA: Diagnosis not present

## 2023-07-14 DIAGNOSIS — F419 Anxiety disorder, unspecified: Secondary | ICD-10-CM | POA: Diagnosis not present

## 2023-07-14 DIAGNOSIS — E1142 Type 2 diabetes mellitus with diabetic polyneuropathy: Secondary | ICD-10-CM | POA: Diagnosis not present

## 2023-07-14 DIAGNOSIS — I700 Atherosclerosis of aorta: Secondary | ICD-10-CM | POA: Diagnosis not present

## 2023-07-14 DIAGNOSIS — E1169 Type 2 diabetes mellitus with other specified complication: Secondary | ICD-10-CM | POA: Diagnosis not present

## 2023-07-14 DIAGNOSIS — I152 Hypertension secondary to endocrine disorders: Secondary | ICD-10-CM | POA: Diagnosis not present

## 2023-07-14 DIAGNOSIS — E1151 Type 2 diabetes mellitus with diabetic peripheral angiopathy without gangrene: Secondary | ICD-10-CM | POA: Diagnosis not present

## 2023-07-14 DIAGNOSIS — M1712 Unilateral primary osteoarthritis, left knee: Secondary | ICD-10-CM | POA: Diagnosis not present

## 2023-07-14 DIAGNOSIS — Z7984 Long term (current) use of oral hypoglycemic drugs: Secondary | ICD-10-CM | POA: Diagnosis not present

## 2023-07-14 DIAGNOSIS — J449 Chronic obstructive pulmonary disease, unspecified: Secondary | ICD-10-CM | POA: Diagnosis not present

## 2023-07-14 DIAGNOSIS — Z7951 Long term (current) use of inhaled steroids: Secondary | ICD-10-CM | POA: Diagnosis not present

## 2023-07-14 DIAGNOSIS — E1159 Type 2 diabetes mellitus with other circulatory complications: Secondary | ICD-10-CM | POA: Diagnosis not present

## 2023-07-14 DIAGNOSIS — M21062 Valgus deformity, not elsewhere classified, left knee: Secondary | ICD-10-CM | POA: Diagnosis not present

## 2023-07-14 DIAGNOSIS — G4700 Insomnia, unspecified: Secondary | ICD-10-CM | POA: Diagnosis not present

## 2023-07-14 DIAGNOSIS — E7800 Pure hypercholesterolemia, unspecified: Secondary | ICD-10-CM | POA: Diagnosis not present

## 2023-07-14 DIAGNOSIS — Z79899 Other long term (current) drug therapy: Secondary | ICD-10-CM | POA: Diagnosis not present

## 2023-07-18 ENCOUNTER — Emergency Department (HOSPITAL_COMMUNITY)
Admission: EM | Admit: 2023-07-18 | Discharge: 2023-07-18 | Disposition: A | Discharge status: Home / Self Care | Attending: Emergency Medicine | Admitting: Emergency Medicine

## 2023-07-18 ENCOUNTER — Other Ambulatory Visit: Payer: Self-pay

## 2023-07-18 ENCOUNTER — Encounter (HOSPITAL_COMMUNITY): Payer: Self-pay

## 2023-07-18 DIAGNOSIS — N3000 Acute cystitis without hematuria: Secondary | ICD-10-CM | POA: Diagnosis not present

## 2023-07-18 DIAGNOSIS — R531 Weakness: Secondary | ICD-10-CM | POA: Diagnosis not present

## 2023-07-18 LAB — COMPREHENSIVE METABOLIC PANEL WITH GFR
ALT: 17 U/L (ref 0–44)
AST: 21 U/L (ref 15–41)
Albumin: 3.6 g/dL (ref 3.5–5.0)
Alkaline Phosphatase: 54 U/L (ref 38–126)
Anion gap: 10 (ref 5–15)
BUN: 20 mg/dL (ref 8–23)
CO2: 23 mmol/L (ref 22–32)
Calcium: 9.2 mg/dL (ref 8.9–10.3)
Chloride: 101 mmol/L (ref 98–111)
Creatinine, Ser: 0.75 mg/dL (ref 0.44–1.00)
GFR, Estimated: >60 mL/min (ref >60)
Glucose, Bld: 122 mg/dL — ABNORMAL HIGH (ref 70–99)
Potassium: 4.4 mmol/L (ref 3.5–5.1)
Sodium: 134 mmol/L — ABNORMAL LOW (ref 135–145)
Total Bilirubin: 0.7 mg/dL (ref 0.0–1.2)
Total Protein: 6.5 g/dL (ref 6.5–8.1)

## 2023-07-18 LAB — CBC WITH DIFFERENTIAL/PLATELET
Abs Immature Granulocytes: 0.02 10*3/uL (ref 0.00–0.07)
Basophils Absolute: 0.1 10*3/uL (ref 0.0–0.1)
Basophils Relative: 1 %
Eosinophils Absolute: 0.2 10*3/uL (ref 0.0–0.5)
Eosinophils Relative: 3 %
HCT: 37 % (ref 36.0–46.0)
Hemoglobin: 11.9 g/dL — ABNORMAL LOW (ref 12.0–15.0)
Immature Granulocytes: 0 %
Lymphocytes Relative: 31 %
Lymphs Abs: 2.4 10*3/uL (ref 0.7–4.0)
MCH: 32 pg (ref 26.0–34.0)
MCHC: 32.2 g/dL (ref 30.0–36.0)
MCV: 99.5 fL (ref 80.0–100.0)
Monocytes Absolute: 0.5 10*3/uL (ref 0.1–1.0)
Monocytes Relative: 6 %
Neutro Abs: 4.6 10*3/uL (ref 1.7–7.7)
Neutrophils Relative %: 59 %
Platelets: 257 10*3/uL (ref 150–400)
RBC: 3.72 MIL/uL — ABNORMAL LOW (ref 3.87–5.11)
RDW: 13.2 % (ref 11.5–15.5)
WBC: 7.7 10*3/uL (ref 4.0–10.5)
nRBC: 0 % (ref 0.0–0.2)

## 2023-07-18 LAB — TSH: TSH: 1.138 u[IU]/mL (ref 0.350–4.500)

## 2023-07-18 LAB — URINALYSIS, ROUTINE W REFLEX MICROSCOPIC
Bilirubin Urine: NEGATIVE
Glucose, UA: NEGATIVE mg/dL
Hgb urine dipstick: NEGATIVE
Ketones, ur: NEGATIVE mg/dL
Leukocytes,Ua: NEGATIVE
Nitrite: POSITIVE — AB
Protein, ur: NEGATIVE mg/dL
Specific Gravity, Urine: 1.021 (ref 1.005–1.030)
pH: 5 (ref 5.0–8.0)

## 2023-07-18 LAB — MAGNESIUM: Magnesium: 1.9 mg/dL (ref 1.7–2.4)

## 2023-07-18 MED ORDER — FOSFOMYCIN TROMETHAMINE 3 G PO PACK
3.0000 g | PACK | Freq: Once | ORAL | Status: AC
  Administered 2023-07-18: 3 g via ORAL
  Filled 2023-07-18: qty 3

## 2023-07-18 NOTE — ED Triage Notes (Signed)
 BIB EMS c/o weakness this am. States she had breakfast and then sat down but she she got up to go get lunch at the table, her feet wouldn't move to walk to the kitchen to eat. Instead she had to eat from her chair. Hx of arthritis.

## 2023-07-18 NOTE — Discharge Instructions (Signed)
 We have given you the medication for the urinary tract infection.  This is a one-time dose, you do not need any medication at home.  All of your other tests look good, you can follow-up with your doctor in the clinic within 3 days, ER for worsening symptoms

## 2023-07-18 NOTE — ED Provider Notes (Signed)
 [place] EMERGENCY DEPARTMENT AT [facility] Provider Note   CSN: [id number] Arrival date & time: 07/18/23  1331     History  Chief Complaint  Patient presents with   Weakness    [name] [name] is a 89 y.o. female.   Weakness  This patient is an 89 year old female, she is severely handicapped by her bilateral lower extremity advanced arthritis which requires her to have someone at the house 24 hours a day.  She is usually able to get out of her chair and out of her bed with assist and significant assistance walking back and forth from her bedroom to the kitchen into the chair and back.  She had done this this morning and went to her recliner and when she tried to get out of the recliner she said that she could not get out because her legs were too weak and hurt too much.  She has no fevers chills nausea vomiting diarrhea, no coughing or shortness of breath, no headache, no weakness in her arms.  She has had to go to rehab in the past because of severe weakness.  Her daughter was with her today when this occurred    Home Medications Prior to Admission medications   Medication Sig Start Date End Date Taking? Authorizing Provider  acetaminophen (TYLENOL) 500 MG tablet Take 500 mg by mouth every 6 (six) hours as needed for moderate pain.    [provider]  albuterol (PROVENTIL) (2.5 MG/3ML) 0.083% nebulizer solution NEBULIZE 1 VIAL EVERY 6 HOURS AS NEEDED FOR SHORTNESS OF BREATH OR WHEEZING Patient taking differently: Take 2.5 mg by nebulization every 6 (six) hours as needed for wheezing or shortness of breath. 09/19/18   Daphine Deutscher, Mary-Margaret, FNP  albuterol (VENTOLIN HFA) 108 (90 Base) MCG/ACT inhaler Inhale 2 puffs into the lungs every 6 (six) hours as needed for wheezing or shortness of breath. 02/20/15   [provider]  ALPRAZolam Prudy Feeler) 0.25 MG tablet Take 1 tablet (0.25 mg total) by mouth 2 (two) times daily as needed for anxiety. 01/15/23   Lanae Boast,  MD  amLODipine (NORVASC) 2.5 MG tablet Take 1 tablet (2.5 mg total) by mouth daily. 03/20/19   Dettinger, Elige Radon, MD  atenolol (TENORMIN) 25 MG tablet Take 25 mg by mouth 2 (two) times daily.    [provider]  bacitracin ointment Apply 1 Application topically 2 (two) times daily. 12/16/21   Peter Garter, PA  blood glucose meter kit and supplies KIT Check BS once daily 12/12/15   Ernestina Penna, MD  Blood Glucose Monitoring Suppl [facility] VERIO FLEX SYSTEM) w/Device KIT CHECK GLUCOSE TWICE DAILY AS NEEDED Dx E11.9 03/13/19   Sonny Masters, FNP  budesonide-formoterol (SYMBICORT) 160-4.5 MCG/ACT inhaler Inhale 2 puffs into the lungs 2 (two) times daily. 10/05/17   Dettinger, Elige Radon, MD  calcium carbonate (SUPER CALCIUM) 1500 (600 Ca) MG TABS tablet Take by mouth. 03/11/15   [provider]  cholecalciferol (VITAMIN D) 1000 UNITS tablet Take 2,000 Units by mouth daily.    [provider]  cloNIDine (CATAPRES) 0.1 MG tablet Take 1 tablet (0.1 mg total) by mouth at bedtime as needed. 05/19/19   Sonny Masters, FNP  escitalopram (LEXAPRO) 20 MG tablet Take 1 tablet (20 mg total) by mouth daily. as directed 03/20/19   Dettinger, Elige Radon, MD  feeding supplement (ENSURE ENLIVE / ENSURE PLUS) LIQD Take 237 mLs by mouth 2 (two) times daily between meals. 01/15/23  Lanae Boast, MD  fluticasone (FLONASE) 50 MCG/ACT nasal spray USE ONE SPRAY in each nostril ONCE daily 05/19/19   Rakes, Doralee Albino, FNP  gabapentin (NEURONTIN) 100 MG capsule Take 1 capsule (100 mg total) by mouth at bedtime. 02/27/20   Persons, [place], PA  glucose blood (ONETOUCH VERIO) test strip CHECK GLUCOSE TWICE DAILY AS NEEDED Dx E11.9 03/13/19   Sonny Masters, FNP  ketoconazole (NIZORAL) 2 % shampoo Apply 1 application topically 2 (two) times a week. 05/26/18   Bennie Pierini, FNP  Lancet Devices (ONE TOUCH DELICA LANCING DEV) MISC Use to check BG up to twice a day. DX: E11.29 - type 2 DM 09/02/16   Ernestina Penna, MD  Lancets [facility] ULTRASOFT) lancets CHECK GLUCOSE TWICE DAILY AS NEEDED Dx E11.9 03/13/19   Sonny Masters, FNP  losartan (COZAAR) 25 MG tablet Take 25 mg by mouth daily. 12/07/22   [provider]  lovastatin (MEVACOR) 40 MG tablet Take 1 tablet (40 mg total) by mouth daily. 03/20/19   Dettinger, Elige Radon, MD  meclizine (ANTIVERT) 25 MG tablet Take 1 tablet (25 mg total) by mouth 3 (three) times daily as needed for dizziness. 03/27/23   Kommor, [place], MD  Melatonin 5 MG CAPS Take 1 capsule (5 mg total) by mouth at bedtime. 03/20/19   Dettinger, Elige Radon, MD  metFORMIN (GLUCOPHAGE-XR) 500 MG 24 hr tablet 1 tablet once daily Patient taking differently: Take 500 mg by mouth daily with breakfast. 1 tablet once daily 03/20/19   Dettinger, Elige Radon, MD  Multiple Vitamin (MULTIVITAMIN WITH MINERALS) TABS tablet Take 1 tablet by mouth daily. 01/15/23   Lanae Boast, MD  neomycin-bacitracin-polymyxin (NEOSPORIN) [phone] ointment Apply 1 Application topically 4 (four) times daily.    [provider]  Omega-3 Fatty Acids (FISH OIL) 1000 MG CAPS Take 1 capsule by mouth daily.    [provider]  oxyCODONE (OXY IR/ROXICODONE) 5 MG immediate release tablet Take 1 tablet (5 mg total) by mouth every 8 (eight) hours as needed for up to 2 doses for moderate pain or severe pain. 01/15/23   Lanae Boast, MD  triamcinolone cream (KENALOG) 0.1 % Apply 1 application topically 2 (two) times daily. 02/20/19   Sonny Masters, FNP      Allergies    Lisinopril, Chlordiazepoxide hcl, Chlordiazepoxide-clidinium, Clarithromycin, Clarithromycin, Penicillin g, Penicillins, Povidone-iodine, Raloxifene hydrochloride, Sertraline hcl, Sulfacetamide sodium, Sulfamethoxazole-trimethoprim, Betadine [povidone iodine], Erythromycin, Hydrogen peroxide, Sertraline hcl, and Sulfa antibiotics    Review of Systems   Review of Systems  Neurological:  Positive for weakness.  All other systems reviewed and  are negative.   Physical Exam Updated Vital Signs BP (!) 112/58   Pulse (!) 55   Temp 98.2 F (36.8 C) (Oral)   Resp 20   Ht 1.575 m (5\' 2" )   Wt 58.5 kg   SpO2 91%   BMI 23.59 kg/m  Physical Exam Vitals and nursing note reviewed.  Constitutional:      General: She is not in acute distress.    Appearance: She is well-developed.  HENT:     Head: Normocephalic and atraumatic.     Mouth/Throat:     Pharynx: No oropharyngeal exudate.  Eyes:     General: No scleral icterus.       Right eye: No discharge.        Left eye: No discharge.     Conjunctiva/sclera: Conjunctivae normal.     Pupils: Pupils are equal, round, and reactive  to light.  Neck:     Thyroid: No thyromegaly.     Vascular: No JVD.  Cardiovascular:     Rate and Rhythm: Normal rate and regular rhythm.     Heart sounds: Normal heart sounds. No murmur heard.    No friction rub. No gallop.  Pulmonary:     Effort: Pulmonary effort is normal. No respiratory distress.     Breath sounds: Normal breath sounds. No wheezing or rales.  Abdominal:     General: Bowel sounds are normal. There is no distension.     Palpations: Abdomen is soft. There is no mass.     Tenderness: There is no abdominal tenderness.  Musculoskeletal:        General: No tenderness.     Cervical back: Normal range of motion and neck supple.     Right lower leg: No edema.     Left lower leg: No edema.     Comments: Severe arthritis of bilateral hips and knees  Lymphadenopathy:     Cervical: No cervical adenopathy.  Skin:    General: Skin is warm and dry.     Findings: No erythema or rash.  Neurological:     General: No focal deficit present.     Mental Status: She is alert.     Coordination: Coordination normal.     Comments: The patient is able to move both legs while in the bed, the movement is minimal.  I am able to stand her at the side of the bed and she is able to hold her own weight while holding onto me and the nurse, she is able to  take 3 steps and then get back into bed with assistance.  Psychiatric:        Behavior: Behavior normal.     ED Results / Procedures / Treatments   Labs (all labs ordered are listed, but only abnormal results are displayed) Labs Reviewed  CBC WITH DIFFERENTIAL/PLATELET - Abnormal; Notable for the following components:      Result Value   RBC 3.72 (*)    Hemoglobin 11.9 (*)    All other components within normal limits  COMPREHENSIVE METABOLIC PANEL WITH GFR - Abnormal; Notable for the following components:   Sodium 134 (*)    Glucose, Bld 122 (*)    All other components within normal limits  URINALYSIS, ROUTINE W REFLEX MICROSCOPIC - Abnormal; Notable for the following components:   APPearance HAZY (*)    Nitrite POSITIVE (*)    Bacteria, UA MANY (*)    All other components within normal limits  URINE CULTURE  MAGNESIUM  TSH    EKG None  Radiology No results found.  Procedures Procedures    Medications Ordered in ED Medications  fosfomycin (MONUROL) packet 3 g (has no administration in time range)    ED Course/ Medical Decision Making/ A&P                                 Medical Decision Making Amount and/or Complexity of Data Reviewed Labs: ordered.  Risk Prescription drug management.   The patient is elderly at 89 years old and has advanced arthritis which prevents her from moving very much.  I suspect that the weakness and pain that she is having is somewhat chronic, she is able to stand and ambulate with me with assistance, awaiting family's arrival, check for other causes of weakness such as metabolic  or infectious  I was able to stand up with the patient at the bedside, she was able to take several steps with me and another nurse, the family is now arrived and states that they are taking care of her at home and know that she needs increased levels of care.  They are comfortable taking her home and understand that there was nothing else going on today.   Her vital signs been normal, labs are unremarkable, there was bacteria in the urine so a culture was sent and she was given a single dose of fosfomycin.  She appears hemodynamically stable at this time.  She is awake alert and has no shortness of breath or any other complaints  Labs: No leukocytosis, no significant anemia, metabolic panel without kidney dysfunction or electrolyte disturbance, magnesium normal        Final Clinical Impression(s) / ED Diagnoses Final diagnoses:  Acute cystitis without hematuria    Rx / DC Orders ED Discharge Orders     None         Eber Hong, MD 07/18/23 1530

## 2023-07-19 DIAGNOSIS — E1159 Type 2 diabetes mellitus with other circulatory complications: Secondary | ICD-10-CM | POA: Diagnosis not present

## 2023-07-19 DIAGNOSIS — E7800 Pure hypercholesterolemia, unspecified: Secondary | ICD-10-CM | POA: Diagnosis not present

## 2023-07-19 DIAGNOSIS — Z9181 History of falling: Secondary | ICD-10-CM | POA: Diagnosis not present

## 2023-07-19 DIAGNOSIS — M21062 Valgus deformity, not elsewhere classified, left knee: Secondary | ICD-10-CM | POA: Diagnosis not present

## 2023-07-19 DIAGNOSIS — G4700 Insomnia, unspecified: Secondary | ICD-10-CM | POA: Diagnosis not present

## 2023-07-19 DIAGNOSIS — F419 Anxiety disorder, unspecified: Secondary | ICD-10-CM | POA: Diagnosis not present

## 2023-07-19 DIAGNOSIS — J449 Chronic obstructive pulmonary disease, unspecified: Secondary | ICD-10-CM | POA: Diagnosis not present

## 2023-07-19 DIAGNOSIS — E1142 Type 2 diabetes mellitus with diabetic polyneuropathy: Secondary | ICD-10-CM | POA: Diagnosis not present

## 2023-07-19 DIAGNOSIS — E1169 Type 2 diabetes mellitus with other specified complication: Secondary | ICD-10-CM | POA: Diagnosis not present

## 2023-07-19 DIAGNOSIS — Z7984 Long term (current) use of oral hypoglycemic drugs: Secondary | ICD-10-CM | POA: Diagnosis not present

## 2023-07-19 DIAGNOSIS — E1151 Type 2 diabetes mellitus with diabetic peripheral angiopathy without gangrene: Secondary | ICD-10-CM | POA: Diagnosis not present

## 2023-07-19 DIAGNOSIS — I700 Atherosclerosis of aorta: Secondary | ICD-10-CM | POA: Diagnosis not present

## 2023-07-19 DIAGNOSIS — Z79899 Other long term (current) drug therapy: Secondary | ICD-10-CM | POA: Diagnosis not present

## 2023-07-19 DIAGNOSIS — Z7951 Long term (current) use of inhaled steroids: Secondary | ICD-10-CM | POA: Diagnosis not present

## 2023-07-19 DIAGNOSIS — I152 Hypertension secondary to endocrine disorders: Secondary | ICD-10-CM | POA: Diagnosis not present

## 2023-07-19 DIAGNOSIS — M1712 Unilateral primary osteoarthritis, left knee: Secondary | ICD-10-CM | POA: Diagnosis not present

## 2023-07-22 LAB — URINE CULTURE: Culture: >=100000 — AB

## 2023-07-23 ENCOUNTER — Telehealth (HOSPITAL_BASED_OUTPATIENT_CLINIC_OR_DEPARTMENT_OTHER): Payer: Self-pay

## 2023-07-23 DIAGNOSIS — I700 Atherosclerosis of aorta: Secondary | ICD-10-CM | POA: Diagnosis not present

## 2023-07-23 DIAGNOSIS — E1159 Type 2 diabetes mellitus with other circulatory complications: Secondary | ICD-10-CM | POA: Diagnosis not present

## 2023-07-23 DIAGNOSIS — Z7951 Long term (current) use of inhaled steroids: Secondary | ICD-10-CM | POA: Diagnosis not present

## 2023-07-23 DIAGNOSIS — I152 Hypertension secondary to endocrine disorders: Secondary | ICD-10-CM | POA: Diagnosis not present

## 2023-07-23 DIAGNOSIS — J449 Chronic obstructive pulmonary disease, unspecified: Secondary | ICD-10-CM | POA: Diagnosis not present

## 2023-07-23 DIAGNOSIS — E1169 Type 2 diabetes mellitus with other specified complication: Secondary | ICD-10-CM | POA: Diagnosis not present

## 2023-07-23 DIAGNOSIS — Z7984 Long term (current) use of oral hypoglycemic drugs: Secondary | ICD-10-CM | POA: Diagnosis not present

## 2023-07-23 DIAGNOSIS — Z79899 Other long term (current) drug therapy: Secondary | ICD-10-CM | POA: Diagnosis not present

## 2023-07-23 DIAGNOSIS — F419 Anxiety disorder, unspecified: Secondary | ICD-10-CM | POA: Diagnosis not present

## 2023-07-23 DIAGNOSIS — Z9181 History of falling: Secondary | ICD-10-CM | POA: Diagnosis not present

## 2023-07-23 DIAGNOSIS — E1142 Type 2 diabetes mellitus with diabetic polyneuropathy: Secondary | ICD-10-CM | POA: Diagnosis not present

## 2023-07-23 DIAGNOSIS — E1151 Type 2 diabetes mellitus with diabetic peripheral angiopathy without gangrene: Secondary | ICD-10-CM | POA: Diagnosis not present

## 2023-07-23 DIAGNOSIS — G4700 Insomnia, unspecified: Secondary | ICD-10-CM | POA: Diagnosis not present

## 2023-07-23 DIAGNOSIS — M21062 Valgus deformity, not elsewhere classified, left knee: Secondary | ICD-10-CM | POA: Diagnosis not present

## 2023-07-23 DIAGNOSIS — E7800 Pure hypercholesterolemia, unspecified: Secondary | ICD-10-CM | POA: Diagnosis not present

## 2023-07-23 DIAGNOSIS — M1712 Unilateral primary osteoarthritis, left knee: Secondary | ICD-10-CM | POA: Diagnosis not present

## 2023-07-23 NOTE — Telephone Encounter (Signed)
 Post ED Visit - Positive Culture Follow-up  Culture report reviewed by antimicrobial stewardship pharmacist: [facility] [x]  Lora Paula, Pharm.D. []  Celedonio Miyamoto, Pharm.D., BCPS AQ-ID []  Garvin Fila, Pharm.D., BCPS []  Georgina Pillion, Pharm.D., BCPS []  Lares, [place].D., BCPS, AAHIVP []  Estella Husk, Pharm.D., BCPS, AAHIVP []  Lysle Pearl, PharmD, BCPS []  Phillips Climes, PharmD, BCPS []  Agapito Games, PharmD, BCPS []  Verlan Friends, PharmD []  Mervyn Gay, PharmD, BCPS []  Vinnie Level, PharmD  [facility] []  Len Childs, PharmD []  Greer Pickerel, PharmD []  Adalberto Cole, PharmD []  Perlie Gold, Rph []  Lonell Face) Jean Rosenthal, PharmD []  Earl Many, PharmD []  Junita Push, PharmD []  Dorna Leitz, PharmD []  Terrilee Files, PharmD []  Lynann Beaver, PharmD []  Keturah Barre, PharmD []  Loralee Pacas, PharmD []  Bernadene Person, PharmD   Positive urine culture Treated with Fosfomycin, organism sensitive to the same and no further patient follow-up is required at this time.  Sandria Senter 07/23/2023, 12:03 PM

## 2023-07-26 ENCOUNTER — Observation Stay (HOSPITAL_COMMUNITY)
Admission: EM | Admit: 2023-07-26 | Discharge: 2023-07-28 | Disposition: A | Discharge status: Skilled Nursing Facility | Attending: Internal Medicine | Admitting: Internal Medicine

## 2023-07-26 ENCOUNTER — Encounter (HOSPITAL_COMMUNITY): Payer: Self-pay

## 2023-07-26 ENCOUNTER — Emergency Department (HOSPITAL_COMMUNITY)

## 2023-07-26 ENCOUNTER — Other Ambulatory Visit: Payer: Self-pay

## 2023-07-26 DIAGNOSIS — Z794 Long term (current) use of insulin: Secondary | ICD-10-CM | POA: Diagnosis not present

## 2023-07-26 DIAGNOSIS — Z96642 Presence of left artificial hip joint: Secondary | ICD-10-CM | POA: Diagnosis not present

## 2023-07-26 DIAGNOSIS — F418 Other specified anxiety disorders: Secondary | ICD-10-CM | POA: Diagnosis not present

## 2023-07-26 DIAGNOSIS — E871 Hypo-osmolality and hyponatremia: Secondary | ICD-10-CM | POA: Insufficient documentation

## 2023-07-26 DIAGNOSIS — D500 Iron deficiency anemia secondary to blood loss (chronic): Secondary | ICD-10-CM | POA: Diagnosis not present

## 2023-07-26 DIAGNOSIS — J449 Chronic obstructive pulmonary disease, unspecified: Secondary | ICD-10-CM | POA: Insufficient documentation

## 2023-07-26 DIAGNOSIS — J410 Simple chronic bronchitis: Secondary | ICD-10-CM | POA: Diagnosis not present

## 2023-07-26 DIAGNOSIS — Y92009 Unspecified place in unspecified non-institutional (private) residence as the place of occurrence of the external cause: Secondary | ICD-10-CM

## 2023-07-26 DIAGNOSIS — I700 Atherosclerosis of aorta: Secondary | ICD-10-CM | POA: Diagnosis not present

## 2023-07-26 DIAGNOSIS — Z9181 History of falling: Secondary | ICD-10-CM | POA: Diagnosis not present

## 2023-07-26 DIAGNOSIS — I152 Hypertension secondary to endocrine disorders: Secondary | ICD-10-CM | POA: Diagnosis not present

## 2023-07-26 DIAGNOSIS — M21062 Valgus deformity, not elsewhere classified, left knee: Secondary | ICD-10-CM | POA: Diagnosis not present

## 2023-07-26 DIAGNOSIS — E1142 Type 2 diabetes mellitus with diabetic polyneuropathy: Secondary | ICD-10-CM | POA: Diagnosis not present

## 2023-07-26 DIAGNOSIS — G9389 Other specified disorders of brain: Secondary | ICD-10-CM | POA: Diagnosis not present

## 2023-07-26 DIAGNOSIS — F32A Depression, unspecified: Secondary | ICD-10-CM | POA: Diagnosis not present

## 2023-07-26 DIAGNOSIS — Z7984 Long term (current) use of oral hypoglycemic drugs: Secondary | ICD-10-CM | POA: Diagnosis not present

## 2023-07-26 DIAGNOSIS — M1611 Unilateral primary osteoarthritis, right hip: Secondary | ICD-10-CM | POA: Diagnosis not present

## 2023-07-26 DIAGNOSIS — N3000 Acute cystitis without hematuria: Secondary | ICD-10-CM | POA: Insufficient documentation

## 2023-07-26 DIAGNOSIS — E1159 Type 2 diabetes mellitus with other circulatory complications: Secondary | ICD-10-CM | POA: Diagnosis present

## 2023-07-26 DIAGNOSIS — E119 Type 2 diabetes mellitus without complications: Secondary | ICD-10-CM

## 2023-07-26 DIAGNOSIS — E7800 Pure hypercholesterolemia, unspecified: Secondary | ICD-10-CM | POA: Diagnosis not present

## 2023-07-26 DIAGNOSIS — Z79899 Other long term (current) drug therapy: Secondary | ICD-10-CM | POA: Insufficient documentation

## 2023-07-26 DIAGNOSIS — E1151 Type 2 diabetes mellitus with diabetic peripheral angiopathy without gangrene: Secondary | ICD-10-CM | POA: Diagnosis not present

## 2023-07-26 DIAGNOSIS — W19XXXA Unspecified fall, initial encounter: Secondary | ICD-10-CM

## 2023-07-26 DIAGNOSIS — M1712 Unilateral primary osteoarthritis, left knee: Secondary | ICD-10-CM | POA: Diagnosis not present

## 2023-07-26 DIAGNOSIS — E1169 Type 2 diabetes mellitus with other specified complication: Secondary | ICD-10-CM | POA: Diagnosis not present

## 2023-07-26 DIAGNOSIS — G319 Degenerative disease of nervous system, unspecified: Secondary | ICD-10-CM | POA: Diagnosis not present

## 2023-07-26 DIAGNOSIS — R0902 Hypoxemia: Secondary | ICD-10-CM | POA: Diagnosis not present

## 2023-07-26 DIAGNOSIS — Z7951 Long term (current) use of inhaled steroids: Secondary | ICD-10-CM | POA: Diagnosis not present

## 2023-07-26 DIAGNOSIS — F419 Anxiety disorder, unspecified: Secondary | ICD-10-CM | POA: Insufficient documentation

## 2023-07-26 DIAGNOSIS — R531 Weakness: Principal | ICD-10-CM

## 2023-07-26 DIAGNOSIS — I6782 Cerebral ischemia: Secondary | ICD-10-CM | POA: Diagnosis not present

## 2023-07-26 DIAGNOSIS — G4700 Insomnia, unspecified: Secondary | ICD-10-CM | POA: Diagnosis not present

## 2023-07-26 DIAGNOSIS — I10 Essential (primary) hypertension: Secondary | ICD-10-CM | POA: Insufficient documentation

## 2023-07-26 LAB — CBG MONITORING, ED
Glucose-Capillary: 126 mg/dL — ABNORMAL HIGH (ref 70–99)
Glucose-Capillary: 121 mg/dL — ABNORMAL HIGH (ref 70–99)

## 2023-07-26 LAB — BASIC METABOLIC PANEL WITH GFR
Anion gap: 9 (ref 5–15)
BUN: 14 mg/dL (ref 8–23)
CO2: 24 mmol/L (ref 22–32)
Calcium: 9.1 mg/dL (ref 8.9–10.3)
Chloride: 102 mmol/L (ref 98–111)
Creatinine, Ser: 0.55 mg/dL (ref 0.44–1.00)
GFR, Estimated: >60 mL/min (ref >60)
Glucose, Bld: 141 mg/dL — ABNORMAL HIGH (ref 70–99)
Potassium: 4.5 mmol/L (ref 3.5–5.1)
Sodium: 135 mmol/L (ref 135–145)

## 2023-07-26 LAB — CBC WITH DIFFERENTIAL/PLATELET
Abs Immature Granulocytes: 0.02 10*3/uL (ref 0.00–0.07)
Basophils Absolute: 0.1 10*3/uL (ref 0.0–0.1)
Basophils Relative: 1 %
Eosinophils Absolute: 0.1 10*3/uL (ref 0.0–0.5)
Eosinophils Relative: 2 %
HCT: 37.8 % (ref 36.0–46.0)
Hemoglobin: 12.1 g/dL (ref 12.0–15.0)
Immature Granulocytes: 0 %
Lymphocytes Relative: 28 %
Lymphs Abs: 2 10*3/uL (ref 0.7–4.0)
MCH: 32 pg (ref 26.0–34.0)
MCHC: 32 g/dL (ref 30.0–36.0)
MCV: 100 fL (ref 80.0–100.0)
Monocytes Absolute: 0.4 10*3/uL (ref 0.1–1.0)
Monocytes Relative: 6 %
Neutro Abs: 4.4 10*3/uL (ref 1.7–7.7)
Neutrophils Relative %: 63 %
Platelets: 257 10*3/uL (ref 150–400)
RBC: 3.78 MIL/uL — ABNORMAL LOW (ref 3.87–5.11)
RDW: 13 % (ref 11.5–15.5)
WBC: 7 10*3/uL (ref 4.0–10.5)
nRBC: 0 % (ref 0.0–0.2)

## 2023-07-26 LAB — URINALYSIS, ROUTINE W REFLEX MICROSCOPIC
Bilirubin Urine: NEGATIVE
Glucose, UA: NEGATIVE mg/dL
Hgb urine dipstick: NEGATIVE
Ketones, ur: NEGATIVE mg/dL
Leukocytes,Ua: NEGATIVE
Nitrite: POSITIVE — AB
Protein, ur: NEGATIVE mg/dL
Specific Gravity, Urine: 1.016 (ref 1.005–1.030)
pH: 5 (ref 5.0–8.0)

## 2023-07-26 LAB — CK: Total CK: 50 U/L (ref 38–234)

## 2023-07-26 MED ORDER — SODIUM CHLORIDE 0.9 % IV SOLN
2.0000 g | Freq: Once | INTRAVENOUS | Status: AC
  Administered 2023-07-26: 2 g via INTRAVENOUS
  Filled 2023-07-26: qty 20

## 2023-07-26 MED ORDER — METFORMIN HCL 500 MG PO TABS
500.0000 mg | ORAL_TABLET | Freq: Every day | ORAL | Status: DC
  Administered 2023-07-27 – 2023-07-28 (×2): 500 mg via ORAL
  Filled 2023-07-26 (×2): qty 1

## 2023-07-26 MED ORDER — SODIUM CHLORIDE 0.9 % IV SOLN
1.0000 g | INTRAVENOUS | Status: DC
  Administered 2023-07-27: 1 g via INTRAVENOUS
  Filled 2023-07-26: qty 10

## 2023-07-26 MED ORDER — INSULIN ASPART 100 UNIT/ML IJ SOLN: 0.0000 [IU] | Freq: Three times a day (TID) | INTRAMUSCULAR | Status: DC

## 2023-07-26 MED ORDER — ATENOLOL 25 MG PO TABS
25.0000 mg | ORAL_TABLET | Freq: Two times a day (BID) | ORAL | Status: DC
  Administered 2023-07-26 – 2023-07-28 (×4): 25 mg via ORAL
  Filled 2023-07-26 (×4): qty 1

## 2023-07-26 MED ORDER — ACETAMINOPHEN 650 MG RE SUPP: 650.0000 mg | Freq: Four times a day (QID) | RECTAL | Status: DC | PRN

## 2023-07-26 MED ORDER — AMLODIPINE BESYLATE 5 MG PO TABS
2.5000 mg | ORAL_TABLET | Freq: Every day | ORAL | Status: DC
Start: 2023-07-27 — End: 2023-07-28
  Administered 2023-07-27 – 2023-07-28 (×2): 2.5 mg via ORAL
  Filled 2023-07-26 (×2): qty 1

## 2023-07-26 MED ORDER — LOSARTAN POTASSIUM 50 MG PO TABS
25.0000 mg | ORAL_TABLET | Freq: Every day | ORAL | Status: DC
  Administered 2023-07-27 – 2023-07-28 (×2): 25 mg via ORAL
  Filled 2023-07-26 (×2): qty 1

## 2023-07-26 MED ORDER — ALPRAZOLAM 0.25 MG PO TABS: 0.2500 mg | ORAL_TABLET | Freq: Two times a day (BID) | ORAL | Status: DC | PRN

## 2023-07-26 MED ORDER — FLUTICASONE FUROATE-VILANTEROL 200-25 MCG/ACT IN AEPB
1.0000 | INHALATION_SPRAY | Freq: Every day | RESPIRATORY_TRACT | Status: DC
  Administered 2023-07-27 – 2023-07-28 (×2): 1 via RESPIRATORY_TRACT
  Filled 2023-07-26: qty 28

## 2023-07-26 MED ORDER — ONDANSETRON HCL 4 MG PO TABS: 4.0000 mg | ORAL_TABLET | Freq: Four times a day (QID) | ORAL | Status: DC | PRN

## 2023-07-26 MED ORDER — ENOXAPARIN SODIUM 40 MG/0.4ML IJ SOSY
40.0000 mg | PREFILLED_SYRINGE | INTRAMUSCULAR | Status: DC
  Administered 2023-07-26 – 2023-07-27 (×2): 40 mg via SUBCUTANEOUS
  Filled 2023-07-26 (×2): qty 0.4

## 2023-07-26 MED ORDER — ESCITALOPRAM OXALATE 10 MG PO TABS
20.0000 mg | ORAL_TABLET | Freq: Every day | ORAL | Status: DC
  Administered 2023-07-27 – 2023-07-28 (×2): 20 mg via ORAL
  Filled 2023-07-26 (×2): qty 2

## 2023-07-26 MED ORDER — GABAPENTIN 100 MG PO CAPS
100.0000 mg | ORAL_CAPSULE | Freq: Every day | ORAL | Status: DC
  Administered 2023-07-26 – 2023-07-27 (×2): 100 mg via ORAL
  Filled 2023-07-26 (×2): qty 1

## 2023-07-26 MED ORDER — TAMSULOSIN HCL 0.4 MG PO CAPS
0.4000 mg | ORAL_CAPSULE | Freq: Every day | ORAL | Status: DC
  Administered 2023-07-27 – 2023-07-28 (×2): 0.4 mg via ORAL
  Filled 2023-07-26 (×2): qty 1

## 2023-07-26 MED ORDER — POLYETHYLENE GLYCOL 3350 17 G PO PACK: 17.0000 g | PACK | Freq: Every day | ORAL | Status: DC | PRN

## 2023-07-26 MED ORDER — ACETAMINOPHEN 325 MG PO TABS
650.0000 mg | ORAL_TABLET | Freq: Four times a day (QID) | ORAL | Status: DC | PRN
  Administered 2023-07-27 – 2023-07-28 (×4): 650 mg via ORAL
  Filled 2023-07-26 (×4): qty 2

## 2023-07-26 MED ORDER — ONDANSETRON HCL 4 MG/2ML IJ SOLN: 4.0000 mg | Freq: Four times a day (QID) | INTRAMUSCULAR | Status: DC | PRN

## 2023-07-26 MED ORDER — INSULIN ASPART 100 UNIT/ML IJ SOLN: 0.0000 [IU] | Freq: Every day | INTRAMUSCULAR | Status: DC

## 2023-07-26 NOTE — Progress Notes (Signed)
   07/26/23 2151  TOC Brief Assessment  Insurance and Status Reviewed  Patient has primary care physician Yes  Home environment has been reviewed From home c/assistance  Prior level of function: Assisted  Prior/Current Home Services  Gasper Karst PT; Private pay aide.)  Social Drivers of Health Review SDOH reviewed no interventions necessary  Readmission risk has been reviewed Yes  Transition of care needs no transition of care needs at this time   Pt from home c/assistance of family and private aides. [facility] c/[facility] for HHPT.

## 2023-07-26 NOTE — Assessment & Plan Note (Addendum)
 Controlled.  A1c 6.4.   - Hold metformin - SSI- S

## 2023-07-26 NOTE — Assessment & Plan Note (Signed)
 Likely due to increasing frailty, debility, advanced age.  Portable x-ray shows severe degenerative reactive changes in right hip joint.  She has chronic weakness to bilateral lower extremities.  Diagnosed with UTI 4/4 and was given a dose of fosfomycin, urine cultures grew E. coli sensitive to ceftriaxone.  UA today with positive nitrites, few bacteria. - Follow blood and urine cultures ordered in ED -IV ceftriaxone 1 g daily -PT OT eval, may need placement

## 2023-07-26 NOTE — ED Notes (Signed)
 Patient transported to MRI

## 2023-07-26 NOTE — Assessment & Plan Note (Signed)
Stable.  Resume home regimen 

## 2023-07-26 NOTE — Assessment & Plan Note (Addendum)
 Stable, resume home Xanax 0.25, Lexapro,

## 2023-07-26 NOTE — H&P (Signed)
 History and Physical    [name] [name] YNW:[id number] DOB: Nov 14, 1931 DOA: 07/26/2023  PCP: Joycelyn Rua, MD   Patient coming from: Home  I have personally briefly reviewed patient's old medical records in [facility]  Chief Complaint: Fall, weakness  HPI: [name] [name] is a 89 y.o. female with medical history significant for COPD, hypertension, diabetes mellitus and depression. On my evaluation, patient is able to answer questions, but she is hard of hearing.  Patient was brought to the ED with reports of generalized weakness and a fall.  Patient fell 2 nights ago- 4/12.  She was using her walker when she fell, she reports her legs just felt weak and gave way.  She has chronic weakness to her bilateral lower extremities.  She reports chronic pain also to her hips and lower extremities.  Patient lives with family, with progressive generalized weakness, and patient requiring increasing care, then able to meet the demands of patient's care.   She was in ED 4/6 with same complaint of weakness, she was diagnosed with a UTI and given a dose of fosfomycin.  Urine cultures grew E. Coli.  ED Course: Stable vitals.  UA with positive nitrites for bacteria.  Pelvic x-ray-no acute fracture or dislocation, shows severe degenerative changes to right hip joint.  Chest x-ray without acute abnormality.  Brain MRI-no acute abnormality, shows chronic small vessel ischemic changes. Blood cultures obtained. IV ceftriaxone given.  Hospitalist to admit for generalized weakness, UTI.  Review of Systems: As per HPI all other systems reviewed and negative.  Past Medical History:  Diagnosis Date   Anxiety    Arthritis    Atrophic vaginitis    Cataract    COPD (chronic obstructive pulmonary disease) (HCC)    Depressive disorder, not elsewhere classified    Diabetes mellitus without complication (HCC)    Disorder of bone and cartilage, unspecified    Elevated blood sugar    Emphysema lung (HCC)     Hypertension    Other and unspecified hyperlipidemia    Postmenopausal    Rhinitis     Past Surgical History:  Procedure Laterality Date   APPENDECTOMY  1957   CATARACT EXTRACTION W/ INTRAOCULAR LENS IMPLANT Bilateral 2005   [place]   COLONOSCOPY N/A 03/08/2014   Procedure: COLONOSCOPY;  Surgeon: Malissa Hippo, MD;  Location: [facility];  Service: Endoscopy;  Laterality: N/A;   EYE SURGERY     JOINT REPLACEMENT     TOTAL HIP ARTHROPLASTY Left 12/27/2013   Procedure: LEFT TOTAL HIP ARTHROPLASTY;  Surgeon: Nadara Mustard, MD;  Location: [facility] OR;  Service: Orthopedics;  Laterality: Left;     reports that she has never smoked. She has never used smokeless tobacco. She reports that she does not drink alcohol and does not use drugs.  Allergies  Allergen Reactions   Lisinopril Cough and Other (See Comments)   Chlordiazepoxide-Clidinium     Can't remember   Clarithromycin     Can't remember   Penicillins     Can't remember   Povidone-Iodine Other (See Comments)   Raloxifene Hydrochloride Other (See Comments)   Sertraline Hcl Other (See Comments)   Sulfacetamide Sodium Nausea And Vomiting   Sulfamethoxazole-Trimethoprim    Betadine [Povidone Iodine] Rash   Erythromycin Itching and Other (See Comments)    Can't remember   Hydrogen Peroxide Rash and Other (See Comments)   Sertraline Hcl Other (See Comments)    Sweating and mouth irritation    Sulfa Antibiotics  Nausea And Vomiting and Other (See Comments)    Family History  Problem Relation Age of Onset   Arthritis Mother    COPD Father    Diabetes Father    Aneurysm Sister    Diabetes Sister    Diabetes Sister    Hypertension Sister    COPD Daughter    Fibromyalgia Daughter    Healthy Daughter     Prior to Admission medications   Medication Sig Start Date End Date Taking? Authorizing Provider  acetaminophen (TYLENOL) 500 MG tablet Take 500 mg by mouth every 6 (six) hours as needed for moderate pain.   Yes [provider]  ALPRAZolam (XANAX) 0.25 MG tablet Take 1 tablet (0.25 mg total) by mouth 2 (two) times daily as needed for anxiety. 01/15/23  Yes Lesa Rape, MD  amLODipine (NORVASC) 2.5 MG tablet Take 1 tablet (2.5 mg total) by mouth daily. 03/20/19  Yes Dettinger, Lucio Sabin, MD  atenolol (TENORMIN) 25 MG tablet Take 25 mg by mouth 2 (two) times daily.   Yes [provider]  budesonide-formoterol (SYMBICORT) 160-4.5 MCG/ACT inhaler Inhale 2 puffs into the lungs 2 (two) times daily. 10/05/17  Yes Dettinger, Lucio Sabin, MD  calcium carbonate (SUPER CALCIUM) 1500 (600 Ca) MG TABS tablet Take 600 mg of elemental calcium by mouth daily with breakfast. 03/11/15  Yes [provider]  cholecalciferol (VITAMIN D) 1000 UNITS tablet Take 2,000 Units by mouth daily.   Yes [provider]  cloNIDine (CATAPRES) 0.1 MG tablet Take 1 tablet (0.1 mg total) by mouth at bedtime as needed. 05/19/19  Yes Rakes, Georgeann Kindred, FNP  escitalopram (LEXAPRO) 20 MG tablet Take 1 tablet (20 mg total) by mouth daily. as directed 03/20/19  Yes Dettinger, Joshua A, MD  fluticasone (FLONASE) 50 MCG/ACT nasal spray USE ONE SPRAY in each nostril ONCE daily Patient taking differently: Place 1 spray into both nostrils daily. USE ONE SPRAY in each nostril ONCE daily 05/19/19  Yes Rakes, Georgeann Kindred, FNP  gabapentin (NEURONTIN) 100 MG capsule Take 1 capsule (100 mg total) by mouth at bedtime. 02/27/20  Yes Persons, Norma Beckers, PA  ketoconazole (NIZORAL) 2 % shampoo Apply 1 application topically 2 (two) times a week. 05/26/18  Yes Martin, Mary-Margaret, FNP  losartan (COZAAR) 25 MG tablet Take 25 mg by mouth daily. 12/07/22  Yes [provider]  lovastatin (MEVACOR) 40 MG tablet Take 1 tablet (40 mg total) by mouth daily. 03/20/19  Yes Dettinger, Lucio Sabin, MD  Melatonin 5 MG CAPS Take 1 capsule (5 mg total) by mouth at bedtime. 03/20/19  Yes Dettinger, Lucio Sabin, MD  metFORMIN (GLUCOPHAGE-XR) 500 MG 24 hr tablet 1 tablet once  daily Patient taking differently: Take 500 mg by mouth daily with breakfast. 1 tablet once daily 03/20/19  Yes Dettinger, Joshua A, MD  Multiple Vitamin (MULTIVITAMIN WITH MINERALS) TABS tablet Take 1 tablet by mouth daily. 01/15/23  Yes Lesa Rape, MD  neomycin-bacitracin-polymyxin (NEOSPORIN) [phone] ointment Apply 1 Application topically daily as needed (infection).   Yes [provider]  tamsulosin (FLOMAX) 0.4 MG CAPS capsule Take 0.4 mg by mouth daily. 03/04/23  Yes [provider]  blood glucose meter kit and supplies KIT Check BS once daily 12/12/15   Joline Ned, MD  Blood Glucose Monitoring Suppl (ONETOUCH VERIO FLEX SYSTEM) w/Device KIT CHECK GLUCOSE TWICE DAILY AS NEEDED Dx E11.9 03/13/19   Rakes, Georgeann Kindred, FNP  glucose blood (ONETOUCH VERIO) test strip CHECK GLUCOSE TWICE DAILY AS NEEDED  Dx E11.9 03/13/19   Galvin Jules, FNP  Lancet Devices (ONE TOUCH DELICA LANCING DEV) MISC Use to check BG up to twice a day. DX: E11.29 - type 2 DM 09/02/16   Joline Ned, MD  Lancets [facility] ULTRASOFT) lancets CHECK GLUCOSE TWICE DAILY AS NEEDED Dx E11.9 03/13/19   Galvin Jules, FNP    Physical Exam: Vitals:   07/26/23 1250 07/26/23 1345 07/26/23 1545 07/26/23 1728  BP:  119/75 (!) 139/45   Pulse:  63 (!) 56   Resp:   17   Temp:    98.5 F (36.9 C)  TempSrc:    Oral  SpO2:  97% 96%   Weight: 58.5 kg     Height: 5\' 2"  (1.575 m)       Constitutional: NAD, calm, comfortable Vitals:   07/26/23 1250 07/26/23 1345 07/26/23 1545 07/26/23 1728  BP:  119/75 (!) 139/45   Pulse:  63 (!) 56   Resp:   17   Temp:    98.5 F (36.9 C)  TempSrc:    Oral  SpO2:  97% 96%   Weight: 58.5 kg     Height: 5\' 2"  (1.575 m)      Eyes: PERRL, lids and conjunctivae normal ENMT: Mucous membranes are moist. .  Neck: normal, supple, no masses, no thyromegaly Respiratory: clear to auscultation bilaterally, no wheezing, no crackles. Normal respiratory effort. No accessory muscle  use.  Cardiovascular: Regular rate and rhythm, no murmurs / rubs / gallops. No extremity edema.  Extremities warm. Abdomen: no tenderness, no masses palpated. No hepatosplenomegaly. Bowel sounds positive.  Musculoskeletal: no clubbing / cyanosis. Deformity to LLE- From knee  to foot-, patient's left lower extremity appears mildly abducted. Skin: no rashes, lesions, ulcers. No induration Neurologic: No facial symmetry, 2/5 strength bilateral lower extremities, unable to lift lower extremities against gravity,  speech fluent. Psychiatric: Normal judgment and insight. Alert and oriented. Normal mood.   Labs on Admission: I have personally reviewed following labs and imaging studies  CBC: Recent Labs  Lab 07/26/23 1428  WBC 7.0  NEUTROABS 4.4  HGB 12.1  HCT 37.8  MCV 100.0  PLT 257   Basic Metabolic Panel: Recent Labs  Lab 07/26/23 1428  NA 135  K 4.5  CL 102  CO2 24  GLUCOSE 141*  BUN 14  CREATININE 0.55  CALCIUM 9.1   Cardiac Enzymes: Recent Labs  Lab 07/26/23 1428  CKTOTAL 50   Urine analysis:    Component Value Date/Time   COLORURINE YELLOW 07/26/2023 1316   APPEARANCEUR HAZY (A) 07/26/2023 1316   APPEARANCEUR Cloudy (A) 04/03/2019 0859   LABSPEC 1.016 07/26/2023 1316   PHURINE 5.0 07/26/2023 1316   GLUCOSEU NEGATIVE 07/26/2023 1316   HGBUR NEGATIVE 07/26/2023 1316   BILIRUBINUR NEGATIVE 07/26/2023 1316   BILIRUBINUR Negative 04/03/2019 0859   KETONESUR NEGATIVE 07/26/2023 1316   PROTEINUR NEGATIVE 07/26/2023 1316   UROBILINOGEN negative 05/01/2015 1520   UROBILINOGEN 0.2 04/13/2010 1200   NITRITE POSITIVE (A) 07/26/2023 1316   LEUKOCYTESUR NEGATIVE 07/26/2023 1316    Radiological Exams on Admission: MR BRAIN WO CONTRAST Result Date: 07/26/2023 CLINICAL DATA:  Provided history: Weakness. EXAM: MRI HEAD WITHOUT CONTRAST TECHNIQUE: Multiplanar, multiecho pulse sequences of the brain and surrounding structures were obtained without intravenous contrast.  COMPARISON:  Head CT 03/27/2023. FINDINGS: Brain: Mild generalized cerebral atrophy. Multifocal T2 FLAIR hyperintense signal abnormality within the cerebral white matter and pons, nonspecific but compatible with mild chronic small vessel ischemic disease.  Known small nonspecific parenchymal calcification within the posterior left frontal lobe, occult by MRI and better appreciated on the prior head CT of 03/27/2023. There is no acute infarct. No evidence of an intracranial mass. No chronic intracranial blood products. No extra-axial fluid collection. No midline shift. Vascular: Maintained flow voids within the proximal large arterial vessels. Skull and upper cervical spine: No focal worrisome marrow lesion. Incompletely assessed cervical spondylosis. Sinuses/Orbits: No mass or acute finding within the imaged orbits. Prior bilateral ocular lens replacement. No significant paranasal sinus disease. IMPRESSION: 1.  No evidence of an acute intracranial abnormality. 2. Mild chronic small vessel ischemic changes within the cerebral white matter and pons. 3. Mild generalized cerebral atrophy. Electronically Signed   By: Jackey Loge D.O.   On: 07/26/2023 16:32   DG Pelvis Portable Result Date: 07/26/2023 CLINICAL DATA:  Fall.  Weakness. EXAM: PORTABLE PELVIS 1-2 VIEWS COMPARISON:  None Available. FINDINGS: Pelvis is intact with normal and symmetric sacroiliac joints. No acute fracture or dislocation. No aggressive osseous lesion. Visualized sacral arcuate lines are unremarkable. Unremarkable symphysis pubis. There are severe degenerative changes of the right hip joint in the form of reduced joint space, subchondral sclerosis / cystic changes and osteophytosis. Postsurgical changes from prior left hip arthroplasty noted. No radiopaque foreign bodies. IMPRESSION: *No acute fracture or dislocation. Severe degenerative changes of the right hip joint. Electronically Signed   By: Jules Schick M.D.   On: 07/26/2023 13:51    DG Chest Portable 1 View Result Date: 07/26/2023 CLINICAL DATA:  Fall.  Weakness. EXAM: PORTABLE CHEST 1 VIEW COMPARISON:  03/27/2023. FINDINGS: Bilateral lung fields are clear. Bilateral costophrenic angles are clear. Normal cardio-mediastinal silhouette. No acute osseous abnormalities. The soft tissues are within normal limits. IMPRESSION: No active disease. Electronically Signed   By: Jules Schick M.D.   On: 07/26/2023 13:50   EKG: Independently reviewed.  Sinus rhythm, rate 58, QTc 485, no significant change from prior.  Assessment/Plan Principal Problem:   Generalized weakness Active Problems:   Fall at home, initial encounter   Depression with anxiety   COPD (chronic obstructive pulmonary disease) (HCC)   Type 2 diabetes mellitus without complication, without long-term current use of insulin (HCC)   Hypertension associated with diabetes (HCC)   Assessment and Plan: * Generalized weakness Likely due to increasing frailty, debility, advanced age.  Portable x-ray shows severe degenerative reactive changes in right hip joint.  She has chronic weakness to bilateral lower extremities.  Diagnosed with UTI 4/4 and was given a dose of fosfomycin, urine cultures grew E. coli sensitive to ceftriaxone.  UA today with positive nitrites, few bacteria. - Follow blood and urine cultures ordered in ED -IV ceftriaxone 1 g daily -PT OT eval, may need placement  Fall at home, initial encounter Mechanical fall secondary to weakness of lower extremity.  MRI brain negative for acute abnormality. -PT OT evaluation  Hypertension associated with diabetes (HCC) Blood pressure stable. -Resume atenolol 25, lorsartan 25 , tamsulosin, norvasc 2.5  Type 2 diabetes mellitus without complication, without long-term current use of insulin (HCC) Controlled.  A1c 6.4.   - Hold metformin - SSI- S  COPD (chronic obstructive pulmonary disease) (HCC) Stable.  Resume home regimen  Depression with  anxiety Stable, resume home Xanax 0.25, Lexapro,    DVT prophylaxis: Lovenox Code Status: FULL code Family Communication: None at bedside Disposition Plan: ~ 2 days Consults called: None  Admission status: Obs medsurg   Author: Onnie Boer, MD 07/26/2023 7:52 PM  For on call review [email or web address].

## 2023-07-26 NOTE — Care Management Obs Status (Signed)
 MEDICARE OBSERVATION STATUS NOTIFICATION   Patient Details  Name: [name] [name] MRN: [id number] Date of Birth: 1931/07/23   Medicare Observation Status Notification Given:  Yes    Geraldina Klinefelter, RN 07/26/2023, 9:28 PM

## 2023-07-26 NOTE — ED Triage Notes (Addendum)
 Pt bib [facility] for generalized weakness. Pt states she fell on Saturday and has bilateral leg pain. Pt states she always has leg pain but worse since the fall. Pt states she had a dx of UTI last week, daughter states they only gave her a one time dose of ABT.

## 2023-07-26 NOTE — ED Provider Notes (Signed)
 [name] EMERGENC[facility] DEPARTMENT AT [facility] Provider Note   CSN: [id number] Arrival date & time: 07/26/23  1239     History {Add pertinent medical, surgical, social history, OB history to HPI:1} Chief Complaint  Patient presents with   Weakness    [name] [name] is a 89 y.o. female.  89 year old female with a history of COPD, diabetes, hypertension, hyperlipidemia, and ambulatory dysfunction/generalized weakness who presents to the emergency department with weakness.  History obtained per patient and her daughters.  Reports that last week she had a fall.  Came into the emergency department because she was weak.  Was treated for UTI.  Was discharged home and reports that her weakness improved and then got worse for the past few days.  She was attempting to walk with a walker yesterday and daughter reports that she got weak and gradually lowered her to the ground.  No injuries as result of the fall.  Was on the ground approximately 30 minutes.  Has been weak since then.  Was working with physical therapy who noted that her heart rate was low and they discussed this with her primary doctor who referred her into the emergency department.       Home Medications Prior to Admission medications   Medication Sig Start Date End Date Taking? Authorizing Provider  acetaminophen (TYLENOL) 500 MG tablet Take 500 mg by mouth every 6 (six) hours as needed for moderate pain.    [provider]  albuterol (PROVENTIL) (2.5 MG/3ML) 0.083% nebulizer solution NEBULIZE 1 VIAL EVERY 6 HOURS AS NEEDED FOR SHORTNESS OF BREATH OR WHEEZING Patient taking differently: Take 2.5 mg by nebulization every 6 (six) hours as needed for wheezing or shortness of breath. 09/19/18   Daphine Deutscher, Mary-Margaret, FNP  albuterol (VENTOLIN HFA) 108 (90 Base) MCG/ACT inhaler Inhale 2 puffs into the lungs every 6 (six) hours as needed for wheezing or shortness of breath. 02/20/15   [provider]  ALPRAZolam  Prudy Feeler) 0.25 MG tablet Take 1 tablet (0.25 mg total) by mouth 2 (two) times daily as needed for anxiety. 01/15/23   Lanae Boast, MD  amLODipine (NORVASC) 2.5 MG tablet Take 1 tablet (2.5 mg total) by mouth daily. 03/20/19   Dettinger, Elige Radon, MD  atenolol (TENORMIN) 25 MG tablet Take 25 mg by mouth 2 (two) times daily.    [provider]  bacitracin ointment Apply 1 Application topically 2 (two) times daily. 12/16/21   Peter Garter, PA  blood glucose meter kit and supplies KIT Check BS once daily 12/12/15   Ernestina Penna, MD  Blood Glucose Monitoring Suppl [facility] VERIO FLEX SYSTEM) w/Device KIT CHECK GLUCOSE TWICE DAILY AS NEEDED Dx E11.9 03/13/19   Sonny Masters, FNP  budesonide-formoterol (SYMBICORT) 160-4.5 MCG/ACT inhaler Inhale 2 puffs into the lungs 2 (two) times daily. 10/05/17   Dettinger, Elige Radon, MD  calcium carbonate (SUPER CALCIUM) 1500 (600 Ca) MG TABS tablet Take by mouth. 03/11/15   [provider]  cholecalciferol (VITAMIN D) 1000 UNITS tablet Take 2,000 Units by mouth daily.    [provider]  cloNIDine (CATAPRES) 0.1 MG tablet Take 1 tablet (0.1 mg total) by mouth at bedtime as needed. 05/19/19   Sonny Masters, FNP  escitalopram (LEXAPRO) 20 MG tablet Take 1 tablet (20 mg total) by mouth daily. as directed 03/20/19   Dettinger, Elige Radon, MD  feeding supplement (ENSURE ENLIVE / ENSURE PLUS) LIQD Take 237 mLs by mouth 2 (two) times daily between  meals. 01/15/23   Lesa Rape, MD  fluticasone (FLONASE) 50 MCG/ACT nasal spray USE ONE SPRAY in each nostril ONCE daily 05/19/19   Rakes, Georgeann Kindred, FNP  gabapentin (NEURONTIN) 100 MG capsule Take 1 capsule (100 mg total) by mouth at bedtime. 02/27/20   Persons, Norma Beckers, PA  glucose blood (ONETOUCH VERIO) test strip CHECK GLUCOSE TWICE DAILY AS NEEDED Dx E11.9 03/13/19   Galvin Jules, FNP  ketoconazole (NIZORAL) 2 % shampoo Apply 1 application topically 2 (two) times a week. 05/26/18   Delfina Feller, FNP   Lancet Devices (ONE TOUCH DELICA LANCING DEV) MISC Use to check BG up to twice a day. DX: E11.29 - type 2 DM 09/02/16   Joline Ned, MD  Lancets [facility] ULTRASOFT) lancets CHECK GLUCOSE TWICE DAILY AS NEEDED Dx E11.9 03/13/19   Galvin Jules, FNP  losartan (COZAAR) 25 MG tablet Take 25 mg by mouth daily. 12/07/22   [provider]  lovastatin (MEVACOR) 40 MG tablet Take 1 tablet (40 mg total) by mouth daily. 03/20/19   Dettinger, Lucio Sabin, MD  meclizine (ANTIVERT) 25 MG tablet Take 1 tablet (25 mg total) by mouth 3 (three) times daily as needed for dizziness. 03/27/23   Kommor, [place], MD  Melatonin 5 MG CAPS Take 1 capsule (5 mg total) by mouth at bedtime. 03/20/19   Dettinger, Lucio Sabin, MD  metFORMIN (GLUCOPHAGE-XR) 500 MG 24 hr tablet 1 tablet once daily Patient taking differently: Take 500 mg by mouth daily with breakfast. 1 tablet once daily 03/20/19   Dettinger, Joshua A, MD  Multiple Vitamin (MULTIVITAMIN WITH MINERALS) TABS tablet Take 1 tablet by mouth daily. 01/15/23   Lesa Rape, MD  neomycin-bacitracin-polymyxin (NEOSPORIN) [phone] ointment Apply 1 Application topically 4 (four) times daily.    [provider]  Omega-3 Fatty Acids (FISH OIL) 1000 MG CAPS Take 1 capsule by mouth daily.    [provider]  oxyCODONE (OXY IR/ROXICODONE) 5 MG immediate release tablet Take 1 tablet (5 mg total) by mouth every 8 (eight) hours as needed for up to 2 doses for moderate pain or severe pain. 01/15/23   Lesa Rape, MD  triamcinolone cream (KENALOG) 0.1 % Apply 1 application topically 2 (two) times daily. 02/20/19   Galvin Jules, FNP      Allergies    Lisinopril, Chlordiazepoxide hcl, Chlordiazepoxide-clidinium, Clarithromycin, Clarithromycin, Penicillin g, Penicillins, Povidone-iodine, Raloxifene hydrochloride, Sertraline hcl, Sulfacetamide sodium, Sulfamethoxazole-trimethoprim, Betadine [povidone iodine], Erythromycin, Hydrogen peroxide, Sertraline hcl, and Sulfa  antibiotics    Review of Systems   Review of Systems  Physical Exam Updated Vital Signs BP 139/62   Pulse (!) 58   Temp 98.5 F (36.9 C) (Oral)   Resp 20   Ht 5\' 2"  (1.575 m)   Wt 58.5 kg   SpO2 95%   BMI 23.59 kg/m  Physical Exam Vitals and nursing note reviewed.  Constitutional:      General: She is not in acute distress.    Appearance: She is well-developed.     Comments: Alert and oriented to person and place.  Thought the year was 2024.  HENT:     Head: Normocephalic and atraumatic.     Right Ear: External ear normal.     Left Ear: External ear normal.     Nose: Nose normal.  Eyes:     Extraocular Movements: Extraocular movements intact.     Conjunctiva/sclera: Conjunctivae normal.     Pupils: Pupils are equal, round, and reactive to light.  Cardiovascular:     Rate and Rhythm: Normal rate and regular rhythm.     Heart sounds: No murmur heard. Pulmonary:     Effort: Pulmonary effort is normal. No respiratory distress.     Breath sounds: Normal breath sounds.  Abdominal:     General: Abdomen is flat. There is no distension.     Palpations: Abdomen is soft. There is no mass.     Tenderness: There is no abdominal tenderness. There is no guarding.  Musculoskeletal:     Cervical back: Normal range of motion and neck supple.     Right lower leg: No edema.     Left lower leg: No edema.  Skin:    General: Skin is warm and dry.  Neurological:     Mental Status: She is alert. Mental status is at baseline.     Cranial Nerves: No cranial nerve deficit.     Sensory: No sensory deficit.     Motor: Weakness (Bilateral lower extremity 2/5 strength which they state is chronic) present.  Psychiatric:        Mood and Affect: Mood normal.     ED Results / Procedures / Treatments   Labs (all labs ordered are listed, but only abnormal results are displayed) Labs Reviewed - No data to display  EKG None  Radiology No results found.  Procedures Procedures  {Document  cardiac monitor, telemetry assessment procedure when appropriate:1}  Medications Ordered in ED Medications - No data to display  ED Course/ Medical Decision Making/ A&P   {   Click here for ABCD2, HEART and other calculatorsREFRESH Note before signing :1}                              Medical Decision Making Amount and/or Complexity of Data Reviewed Labs: ordered. Radiology: ordered.   ***  {Document critical care time when appropriate:1} {Document review of labs and clinical decision tools ie heart score, Chads2Vasc2 etc:1}  {Document your independent review of radiology images, and any outside records:1} {Document your discussion with family members, caretakers, and with consultants:1} {Document social determinants of health affecting pt's care:1} {Document your decision making why or why not admission, treatments were needed:1} Final Clinical Impression(s) / ED Diagnoses Final diagnoses:  None    Rx / DC Orders ED Discharge Orders     None

## 2023-07-26 NOTE — Assessment & Plan Note (Addendum)
 Blood pressure stable. -Resume atenolol 25, lorsartan 25 , tamsulosin, norvasc 2.5

## 2023-07-26 NOTE — ED Provider Notes (Signed)
 4:50 PM Patient signed out to me by previous ED physician. Pt is a 89 yo female presenting for generalized weakness. Walks with walker at baseline. Has chronic weakness. Recently treated for uti.   Exam demonstrates bilateral lower extremity weakness only. UA positive for nitrates. Antibiotics initiated. Family expresses that they are no longer able to care for her.  Mri brain pending.  Physical Exam  BP 119/75   Pulse 63   Temp 98.5 F (36.9 C) (Oral)   Resp 20   Ht 5\' 2"  (1.575 m)   Wt 58.5 kg   SpO2 97%   BMI 23.59 kg/m   Physical Exam  Procedures  Procedures  ED Course / MDM    Medical Decision Making Amount and/or Complexity of Data Reviewed Labs: ordered. Radiology: ordered.  Risk Decision regarding hospitalization.   MRI brain demonstrates no acute process. No low back pain.  Doubt cauda equina or acute cord apology.  No leukocytosis or signs or symptoms of sepsis.  UA nitrite positive however few bacteria and only 6-10 white blood cells.  Urine culture ordered.  Patient was seen previously for fall with head trauma and diagnosed with a urinary tract infection.  Urine culture at that time was positive for E. coli and sensitive to tract stone.  Ceftriaxone given today.  Patient treated by previous provider.  I spoke with the patient's caregiver who states she will be more than happy to take the patient back home to care for her after the patient is closer to her baseline including being able to ambulate to the bathroom.  Currently her lower extremity weakness is preventing her from doing that.  They are also on the waiting list for the past [facility].  Work with admitting provider Dr. Quintella Buck agreeable to admission for bilateral lower extremity weakness likely from urinary tract infection.  Cultures pending.      Quinn Bucco, DO 07/26/23 1730

## 2023-07-26 NOTE — Assessment & Plan Note (Addendum)
 Mechanical fall secondary to weakness of lower extremity.  MRI brain negative for acute abnormality. -PT OT evaluation

## 2023-07-27 DIAGNOSIS — J410 Simple chronic bronchitis: Secondary | ICD-10-CM | POA: Diagnosis not present

## 2023-07-27 DIAGNOSIS — R531 Weakness: Secondary | ICD-10-CM | POA: Diagnosis not present

## 2023-07-27 DIAGNOSIS — F418 Other specified anxiety disorders: Secondary | ICD-10-CM | POA: Diagnosis not present

## 2023-07-27 LAB — CBC
HCT: 37.5 % (ref 36.0–46.0)
Hemoglobin: 12.2 g/dL (ref 12.0–15.0)
MCH: 32 pg (ref 26.0–34.0)
MCHC: 32.5 g/dL (ref 30.0–36.0)
MCV: 98.4 fL (ref 80.0–100.0)
Platelets: 271 10*3/uL (ref 150–400)
RBC: 3.81 MIL/uL — ABNORMAL LOW (ref 3.87–5.11)
RDW: 13 % (ref 11.5–15.5)
WBC: 7.6 10*3/uL (ref 4.0–10.5)
nRBC: 0 % (ref 0.0–0.2)

## 2023-07-27 LAB — BASIC METABOLIC PANEL WITH GFR
Anion gap: 6 (ref 5–15)
BUN: 11 mg/dL (ref 8–23)
CO2: 25 mmol/L (ref 22–32)
Calcium: 9.2 mg/dL (ref 8.9–10.3)
Chloride: 105 mmol/L (ref 98–111)
Creatinine, Ser: 0.48 mg/dL (ref 0.44–1.00)
GFR, Estimated: >60 mL/min (ref >60)
Glucose, Bld: 112 mg/dL — ABNORMAL HIGH (ref 70–99)
Potassium: 4.3 mmol/L (ref 3.5–5.1)
Sodium: 136 mmol/L (ref 135–145)

## 2023-07-27 LAB — URINE CULTURE

## 2023-07-27 LAB — GLUCOSE, CAPILLARY
Glucose-Capillary: 136 mg/dL — ABNORMAL HIGH (ref 70–99)
Glucose-Capillary: 136 mg/dL — ABNORMAL HIGH (ref 70–99)
Glucose-Capillary: 149 mg/dL — ABNORMAL HIGH (ref 70–99)

## 2023-07-27 LAB — CBG MONITORING, ED: Glucose-Capillary: 119 mg/dL — ABNORMAL HIGH (ref 70–99)

## 2023-07-27 MED ORDER — DICLOFENAC SODIUM 1 % EX GEL
2.0000 g | Freq: Four times a day (QID) | CUTANEOUS | Status: DC
Start: 2023-07-27 — End: 2023-07-28
  Administered 2023-07-27 – 2023-07-28 (×3): 2 g via TOPICAL
  Filled 2023-07-27: qty 100

## 2023-07-27 NOTE — Evaluation (Signed)
 Occupational Therapy Evaluation Patient Details Name: [name] [name] MRN: [id number] DOB: [birth year] Today's Date: 07/27/2023   History of Present Illness   [name] [name] is a 89 y.o. female with medical history significant for COPD, hypertension, diabetes mellitus and depression.  On my evaluation, patient is able to answer questions, but she is hard of hearing.  Patient was brought to the ED with reports of generalized weakness and a fall.  Patient fell 2 nights ago- 4/12.  She was using her walker when she fell, she reports her legs just felt weak and gave way.  She has chronic weakness to her bilateral lower extremities.  She reports chronic pain also to her hips and lower extremities.  Patient lives with family, with progressive generalized weakness, and patient requiring increasing care, then able to meet the demands of patient's care.    She was in ED 4/6 with same complaint of weakness, she was diagnosed with a UTI and given a dose of fosfomycin.  Urine cultures grew E. Coli. (per MD)     Clinical Impressions Pt agreeable to OT and PT co-evaluation. Pt reports ADL assist at baseline with use of RW for ambulation in the house. Pt required mod A for bed mobility and mod to max for step pivot to chair. Pt is very unsteady with posterior lean in sitting and standing. B UE weak with limited shoulder flexion A/ROM. Much assist for ADL's at this time. Pt left in the chair with call bell within reach and chair alarm set. Pt will benefit from continued OT in the hospital and recommended venue below to increase strength, balance, and endurance for safe ADL's.        If plan is discharge home, recommend the following:   A lot of help with walking and/or transfers;A lot of help with bathing/dressing/bathroom;Assistance with cooking/housework;Assist for transportation;Help with stairs or ramp for entrance     Functional Status Assessment   Patient has had a recent decline in their functional  status and demonstrates the ability to make significant improvements in function in a reasonable and predictable amount of time.     Equipment Recommendations   None recommended by OT             Precautions/Restrictions   Precautions Precautions: Fall Recall of Precautions/Restrictions: Intact Restrictions Weight Bearing Restrictions Per Provider Order: No     Mobility Bed Mobility Overal bed mobility: Needs Assistance Bed Mobility: Supine to Sit     Supine to sit: Mod assist, HOB elevated     General bed mobility comments: labored movement; physical assist needed    Transfers Overall transfer level: Needs assistance Equipment used: Rolling walker (2 wheels) Transfers: Sit to/from Stand, Bed to chair/wheelchair/BSC Sit to Stand: Mod assist, Max assist     Step pivot transfers: Mod assist, Max assist     General transfer comment: labored movement; posterior lean      Balance Overall balance assessment: Needs assistance Sitting-balance support: No upper extremity supported, Feet supported Sitting balance-Leahy Scale: Poor Sitting balance - Comments: poor to fair seated at EOB Postural control: Posterior lean Standing balance support: During functional activity, Reliant on assistive device for balance, Bilateral upper extremity supported Standing balance-Leahy Scale: Poor Standing balance comment: using RW                           ADL either performed or assessed with clinical judgement   ADL Overall ADL's : Needs assistance/impaired  Grooming: Contact guard assist;Minimal assistance;Sitting   Upper Body Bathing: Minimal assistance;Sitting;Moderate assistance   Lower Body Bathing: Total assistance;Maximal assistance;Sitting/lateral leans   Upper Body Dressing : Minimal assistance;Moderate assistance;Sitting   Lower Body Dressing: Maximal assistance;Total assistance;Sitting/lateral leans   Toilet Transfer: Moderate  assistance;Maximal assistance;Rolling walker (2 wheels);Stand-pivot Statistician Details (indicate cue type and reason): Simulated via EOB to chair transfer Toileting- Clothing Manipulation and Hygiene: Maximal assistance;Sitting/lateral lean       Functional mobility during ADLs: Maximal assistance;Rolling walker (2 wheels)       Vision Baseline Vision/History: 1 Wears glasses Ability to See in Adequate Light: 1 Impaired Patient Visual Report: No change from baseline Vision Assessment?: No apparent visual deficits     Perception Perception: Not tested       Praxis Praxis: Not tested       Pertinent Vitals/Pain Pain Assessment Pain Assessment: Faces Faces Pain Scale: Hurts little more Pain Location: B LE from arthritis Pain Descriptors / Indicators: Discomfort Pain Intervention(s): Limited activity within patient's tolerance, Monitored during session, Repositioned     Extremity/Trunk Assessment Upper Extremity Assessment Upper Extremity Assessment: Generalized weakness (3-/5 shoulder flexion bilaterally. Near full P/ROM for the same motion.)   Lower Extremity Assessment Lower Extremity Assessment: Defer to PT evaluation   Cervical / Trunk Assessment Cervical / Trunk Assessment: Kyphotic   Communication Communication Communication: Impaired Factors Affecting Communication: Hearing impaired   Cognition Arousal: Alert Behavior During Therapy: WFL for tasks assessed/performed Cognition: No apparent impairments                               Following commands: Intact       Cueing  General Comments   Cueing Techniques: Gestural cues;Verbal cues;Tactile cues                 Home Living Family/patient expects to be discharged to:: Private residence Living Arrangements: Children Available Help at Discharge: Family;Available 24 hours/day Type of Home: House Home Access: Stairs to enter [employer] of Steps: 2-3 Entrance  Stairs-Rails: Right;Left;Can reach both Home Layout: One level     Bathroom Shower/Tub: Chief Strategy Officer: Standard Bathroom Accessibility: Yes   Home Equipment: Agricultural consultant (2 wheels);Shower seat;Grab bars - tub/shower   Additional Comments: Pt reports that her children live with her currently and can provide 24/7 assist.      Prior Functioning/Environment Prior Level of Function : Needs assist       Physical Assist : ADLs (physical)   ADLs (physical): Bathing;Dressing;Toileting;IADLs Mobility Comments: houshold ambulator with RW ADLs Comments: Reports assist for bathing, dressing, and toileting. IADL assist.    OT Problem List: Decreased strength;Decreased range of motion;Decreased activity tolerance;Impaired balance (sitting and/or standing)   OT Treatment/Interventions: Self-care/ADL training;Therapeutic exercise;DME and/or AE instruction;Therapeutic activities;Patient/family education;Balance training      OT Goals(Current goals can be found in the care plan section)   Acute Rehab OT Goals Patient Stated Goal: improve function OT Goal Formulation: With patient Time For Goal Achievement: 08/10/23 Potential to Achieve Goals: Good   OT Frequency:  Min 2X/week    Co-evaluation PT/OT/SLP Co-Evaluation/Treatment: Yes Reason for Co-Treatment: To address functional/ADL transfers   OT goals addressed during session: ADL's and self-care      AM-PAC OT "6 Clicks" Daily Activity     Outcome Measure Help from another person eating meals?: None Help from another person taking care of personal grooming?: A Little Help from another  person toileting, which includes using toliet, bedpan, or urinal?: A Lot Help from another person bathing (including washing, rinsing, drying)?: A Lot Help from another person to put on and taking off regular upper body clothing?: A Lot Help from another person to put on and taking off regular lower body clothing?: A Lot 6  Click Score: 15   End of Session Equipment Utilized During Treatment: Rolling walker (2 wheels)  Activity Tolerance: Patient tolerated treatment well Patient left: in chair;with call bell/phone within reach  OT Visit Diagnosis: Unsteadiness on feet (R26.81);Other abnormalities of gait and mobility (R26.89);Repeated falls (R29.6);History of falling (Z91.81);Muscle weakness (generalized) (M62.81)                Time: 0932-3557 OT Time Calculation (min): 16 min Charges:  OT General Charges $OT Visit: 1 Visit OT Evaluation $OT Eval Low Complexity: 1 Low  Detta Mellin OT, MOT  Thurnell Floss 07/27/2023, 9:31 AM

## 2023-07-27 NOTE — Progress Notes (Signed)
 [facility]  PROGRESS NOTE  [name] [name] UJW:[id number] DOB: 1931-11-24 DOA: 07/26/2023 PCP: Joycelyn Rua, MD   Brief HPI:   89 y.o. female with medical history significant for COPD, hypertension, diabetes mellitus and   Patient was brought to the ED with reports of generalized weakness and a fall.  Patient fell 2 nights ago- 4/12.  She was using her walker when she fell, she reports her legs just felt weak and gave way.  She has chronic weakness to her bilateral lower extremities.  She reports chronic pain also to her hips and lower extremities.  Patient lives with family, with progressive generalized weakness, and patient requiring increasing care, then able to meet the demands of patient's care.   She was in ED 4/6 with same complaint of weakness, she was diagnosed with a UTI and given a dose of fosfomycin.  Urine cultures grew E. Coli.    UA with positive nitrites for bacteria.  Pelvic x-ray-no acute fracture or dislocation, shows severe degenerative changes to right hip joint.  Chest x-ray without acute abnormality.  Brain MRI-no acute abnormality, shows chronic small vessel ischemic changes.    Assessment/Plan:    Generalized weakness -Chronic weakness of bilateral extremities, has osteoarthritis pain in both knees -X-ray showed degenerative reactive changes in right hip joint -  Diagnosed with UTI 4/4 and was given a dose of fosfomycin, urine cultures grew E. coli sensitive to ceftriaxone.  UA today with positive nitrites, few bacteria. - Follow blood and urine cultures ordered in ED -IV ceftriaxone 1 g daily -PT OT eval, may need placement   Fall at home, initial encounter Mechanical fall secondary to weakness of lower extremity.   MRI brain negative for acute abnormality. -PT OT evaluation   Hypertension associated with diabetes (HCC) Blood pressure stable. -Resume atenolol 25, lorsartan 25 , tamsulosin, norvasc 2.5   Type 2 diabetes mellitus without complication,  without long-term current use of insulin (HCC) Controlled.  A1c 6.4.   - Hold metformin Sensitive sliding scale insulin with NovoLog -CBG well-controlled   COPD (chronic obstructive pulmonary disease) (HCC) Stable.  Resume home regimen   Depression with anxiety Stable, resume home Xanax 0.25, Lexapro,      Medications     amLODipine  2.5 mg Oral Daily   atenolol  25 mg Oral BID   enoxaparin (LOVENOX) injection  40 mg Subcutaneous Q24H   escitalopram  20 mg Oral Daily   fluticasone furoate-vilanterol  1 puff Inhalation Daily   gabapentin  100 mg Oral QHS   insulin aspart  0-5 Units Subcutaneous QHS   insulin aspart  0-9 Units Subcutaneous TID WC   losartan  25 mg Oral Daily   metFORMIN  500 mg Oral Q breakfast   tamsulosin  0.4 mg Oral Daily     Data Reviewed:   CBG:  Recent Labs  Lab 07/26/23 2114 07/26/23 2204 07/27/23 0741  GLUCAP 126* 121* 119*    SpO2: 95 %    Vitals:   07/27/23 0050 07/27/23 0505 07/27/23 0734 07/27/23 0805  BP: (!) 142/88 (!) 166/60 (!) 161/59 (!) 166/68  Pulse: (!) 56 (!) 56 64 68  Resp: 15 16 18 18   Temp:   98 F (36.7 C) 98.2 F (36.8 C)  TempSrc:   Oral   SpO2: 95% 96% 95% 95%  Weight:      Height:          Data Reviewed:  Basic Metabolic Panel: Recent Labs  Lab 07/26/23 1428  07/27/23 0303  NA 135 136  K 4.5 4.3  CL 102 105  CO2 24 25  GLUCOSE 141* 112*  BUN 14 11  CREATININE 0.55 0.48  CALCIUM 9.1 9.2    CBC: Recent Labs  Lab 07/26/23 1428 07/27/23 0303  WBC 7.0 7.6  NEUTROABS 4.4  --   HGB 12.1 12.2  HCT 37.8 37.5  MCV 100.0 98.4  PLT 257 271    LFT No results for input(s): "AST", "ALT", "ALKPHOS", "BILITOT", "PROT", "ALBUMIN" in the last 168 hours.   Antibiotics: Anti-infectives (From admission, onward)    Start     Dose/Rate Route Frequency Ordered Stop   07/27/23 1600  cefTRIAXone (ROCEPHIN) 1 g in sodium chloride 0.9 % 100 mL IVPB        1 g 200 mL/hr over 30 Minutes Intravenous Every  24 hours 07/26/23 1958     07/26/23 1615  cefTRIAXone (ROCEPHIN) 2 g in sodium chloride 0.9 % 100 mL IVPB        2 g 200 mL/hr over 30 Minutes Intravenous  Once 07/26/23 1601 07/26/23 1722        DVT prophylaxis: Lovenox  Code Status: Full code  Family Communication: Discussed with patient's daughter at bedside   CONSULTS    Subjective   Patient complains of bilateral knee pain from arthritis.  Improved with Tylenol this morning   Objective    Physical Examination:   General-appears in no acute distress Heart-S1-S2, regular, no murmur auscultated Lungs-clear to auscultation bilaterally, no wheezing or crackles auscultated Abdomen-soft, nontender, no organomegaly Extremities-no edema in the lower extremities Neuro-alert, oriented x3, no focal deficit noted  Status is: Inpatient:             [name]   [facility] If 7PM-7AM, please contact night-coverage at [email or web address], Office  [phone]   07/27/2023, 8:41 AM  LOS: 0 days

## 2023-07-27 NOTE — Plan of Care (Signed)

## 2023-07-27 NOTE — Evaluation (Signed)
 Physical Therapy Evaluation Patient Details Name: [name] MRN: [id number] DOB: May 26, 1931 Today's Date: 07/27/2023  History of Present Illness  [name] [name] is a 89 y.o. female with medical history significant for COPD, hypertension, diabetes mellitus and depression.  On my evaluation, patient is able to answer questions, but she is hard of hearing.  Patient was brought to the ED with reports of generalized weakness and a fall.  Patient fell 2 nights ago- 4/12.  She was using her walker when she fell, she reports her legs just felt weak and gave way.  She has chronic weakness to her bilateral lower extremities.  She reports chronic pain also to her hips and lower extremities.  Patient lives with family, with progressive generalized weakness, and patient requiring increasing care, then able to meet the demands of patient's care.    She was in ED 4/6 with same complaint of weakness, she was diagnosed with a UTI and given a dose of fosfomycin.  Urine cultures grew E. Coli.   Clinical Impression  Patient demonstrates slow labored movement for sitting up at bedside, once seated had frequent posterior leaning, very unsteady on feet and limited to a few side steps before having to sit due to c/o fatigue and generalized weakness. Patient tolerated sitting up in chair after therapy. Patient will benefit from continued skilled physical therapy in hospital and recommended venue below to increase strength, balance, endurance for safe ADLs and gait.           If plan is discharge home, recommend the following: A lot of help with bathing/dressing/bathroom;A lot of help with walking and/or transfers;Help with stairs or ramp for entrance;Assistance with cooking/housework   Can travel by private vehicle   No    Equipment Recommendations None recommended by PT  Recommendations for Other Services       Functional Status Assessment Patient has had a recent decline in their functional status and  demonstrates the ability to make significant improvements in function in a reasonable and predictable amount of time.     Precautions / Restrictions Precautions Precautions: Fall Restrictions Weight Bearing Restrictions Per Provider Order: No      Mobility  Bed Mobility Overal bed mobility: Needs Assistance Bed Mobility: Supine to Sit     Supine to sit: Mod assist, HOB elevated     General bed mobility comments: increased time, labored movement with posterior leaning once seated    Transfers Overall transfer level: Needs assistance Equipment used: Rolling walker (2 wheels) Transfers: Sit to/from Stand, Bed to chair/wheelchair/BSC Sit to Stand: Mod assist, Max assist   Step pivot transfers: Mod assist, Max assist       General transfer comment: very unsteady on feet with posterior leaning once standing    Ambulation/Gait Ambulation/Gait assistance: Mod assist, Max assist Gait Distance (Feet): 4 Feet Assistive device: Rolling walker (2 wheels) Gait Pattern/deviations: Decreased step length - right, Decreased step length - left, Decreased stride length Gait velocity: slow     General Gait Details: limited to a few unsteady labored steps with poor tolerance for taking steps due to c/o fatigue, BLE weakness  Stairs            Wheelchair Mobility     Tilt Bed    Modified Rankin (Stroke Patients Only)       Balance Overall balance assessment: Needs assistance Sitting-balance support: Feet unsupported, No upper extremity supported Sitting balance-Leahy Scale: Poor Sitting balance - Comments: fair/poor seated at EOB Postural control: Posterior  lean Standing balance support: During functional activity, Reliant on assistive device for balance, Bilateral upper extremity supported Standing balance-Leahy Scale: Poor Standing balance comment: using RW                             Pertinent Vitals/Pain Pain Assessment Pain Assessment: Faces Faces  Pain Scale: Hurts little more Pain Location: B LE from arthritis Pain Descriptors / Indicators: Discomfort Pain Intervention(s): Limited activity within patient's tolerance, Monitored during session, Repositioned    Home Living Family/patient expects to be discharged to:: Private residence Living Arrangements: Children Available Help at Discharge: Family;Available 24 hours/day Type of Home: House Home Access: Stairs to enter Entrance Stairs-Rails: Right;Left;Can reach both Entrance Stairs-Number of Steps: 2-3   Home Layout: One level Home Equipment: Agricultural consultant (2 wheels);Shower seat;Grab bars - tub/shower Additional Comments: Pt reports that her children live with her currently and can provide 24/7 assist.    Prior Function Prior Level of Function : Needs assist       Physical Assist : ADLs (physical)   ADLs (physical): Bathing;Dressing;Toileting;IADLs Mobility Comments: houshold ambulator with RW ADLs Comments: Reports assist for bathing, dressing, and toileting. IADL assist.     Extremity/Trunk Assessment   Upper Extremity Assessment Upper Extremity Assessment: Defer to OT evaluation    Lower Extremity Assessment Lower Extremity Assessment: Generalized weakness    Cervical / Trunk Assessment Cervical / Trunk Assessment: Kyphotic  Communication   Communication Communication: Impaired Factors Affecting Communication: Hearing impaired;Other (comment) (better hearing from left side)    Cognition Arousal: Alert Behavior During Therapy: WFL for tasks assessed/performed   PT - Cognitive impairments: No apparent impairments                         Following commands: Intact       Cueing Cueing Techniques: Gestural cues, Verbal cues, Tactile cues     General Comments      Exercises     Assessment/Plan    PT Assessment Patient needs continued PT services  PT Problem List Decreased strength;Decreased activity tolerance;Decreased  balance;Decreased mobility       PT Treatment Interventions DME instruction;Gait training;Stair training;Functional mobility training;Therapeutic activities;Therapeutic exercise;Balance training;Patient/family education    PT Goals (Current goals can be found in the Care Plan section)  Acute Rehab PT Goals Patient Stated Goal: return home with family to assist PT Goal Formulation: With patient Time For Goal Achievement: 08/10/23 Potential to Achieve Goals: Good    Frequency Min 3X/week     Co-evaluation PT/OT/SLP Co-Evaluation/Treatment: Yes Reason for Co-Treatment: To address functional/ADL transfers PT goals addressed during session: Mobility/safety with mobility;Balance;Proper use of DME OT goals addressed during session: ADL's and self-care       AM-PAC PT "6 Clicks" Mobility  Outcome Measure Help needed turning from your back to your side while in a flat bed without using bedrails?: A Lot Help needed moving from lying on your back to sitting on the side of a flat bed without using bedrails?: A Lot Help needed moving to and from a bed to a chair (including a wheelchair)?: A Lot Help needed standing up from a chair using your arms (e.g., wheelchair or bedside chair)?: A Lot Help needed to walk in hospital room?: A Lot Help needed climbing 3-5 steps with a railing? : Total 6 Click Score: 11    End of Session   Activity Tolerance: Patient tolerated treatment well;Patient limited by  fatigue Patient left: in chair;with call bell/phone within reach;with chair alarm set Nurse Communication: Mobility status PT Visit Diagnosis: Unsteadiness on feet (R26.81);Other abnormalities of gait and mobility (R26.89);Muscle weakness (generalized) (M62.81)    Time: 1610-9604 PT Time Calculation (min) (ACUTE ONLY): 28 min   Charges:   PT Evaluation $PT Eval Moderate Complexity: 1 Mod PT Treatments $Therapeutic Activity: 23-37 mins PT General Charges $$ ACUTE PT VISIT: 1 Visit          12:22 PM, 07/27/23 Walton Guppy, MPT Physical Therapist with [facility] [phone] office [phone] mobile phone

## 2023-07-27 NOTE — Plan of Care (Signed)
  Problem: Acute Rehab PT Goals(only PT should resolve) Goal: Pt Will Go Supine/Side To Sit Outcome: Progressing Flowsheets (Taken 07/27/2023 1223) Pt will go Supine/Side to Sit:  with minimal assist  with moderate assist Goal: Patient Will Transfer Sit To/From Stand Outcome: Progressing Flowsheets (Taken 07/27/2023 1223) Patient will transfer sit to/from stand:  with minimal assist  with moderate assist Goal: Pt Will Transfer Bed To Chair/Chair To Bed Outcome: Progressing Flowsheets (Taken 07/27/2023 1223) Pt will Transfer Bed to Chair/Chair to Bed:  with min assist  with mod assist Goal: Pt Will Ambulate Outcome: Progressing Flowsheets (Taken 07/27/2023 1223) Pt will Ambulate:  15 feet  with moderate assist  with rolling walker   12:23 PM, 07/27/23 Walton Guppy, MPT Physical Therapist with [facility] [phone] office [phone] mobile phone

## 2023-07-27 NOTE — Plan of Care (Signed)
  Problem: Acute Rehab OT Goals (only OT should resolve) Goal: Pt. Will Perform Grooming Flowsheets (Taken 07/27/2023 0933) Pt Will Perform Grooming:  with modified independence  sitting Goal: Pt. Will Perform Upper Body Dressing Flowsheets (Taken 07/27/2023 0933) Pt Will Perform Upper Body Dressing:  with contact guard assist  sitting Goal: Pt. Will Perform Lower Body Dressing Flowsheets (Taken 07/27/2023 0933) Pt Will Perform Lower Body Dressing:  with min assist  sitting/lateral leans Goal: Pt. Will Transfer To Toilet Flowsheets (Taken 07/27/2023 [phone]) Pt Will Transfer to Toilet:  with contact guard assist  stand pivot transfer Goal: Pt. Will Perform Toileting-Clothing Manipulation Flowsheets (Taken 07/27/2023 0933) Pt Will Perform Toileting - Clothing Manipulation and hygiene:  with contact guard assist  sitting/lateral leans Goal: Pt/Caregiver Will Perform Home Exercise Program Flowsheets (Taken 07/27/2023 0933) Pt/caregiver will Perform Home Exercise Program:  Increased strength  Increased ROM  Independently  Both right and left upper extremity  Arshia Rondon OT, MOT

## 2023-07-27 NOTE — [place] FL2 (Signed)
 [place] MEDICAID FL2 LEVEL OF CARE FORM     IDENTIFICATION  Patient Name: [name] [name] Birthdate: [birth year] Sex: female Admission Date (Current Location): 07/26/2023  [facility] and [place] Number:  [employer] and Address:  [facility],  [address] [place] [address]      Provider Number: [id number]  Attending Physician Name and Address:  Meredeth Ide, MD  Relative Name and Phone Number:       Current Level of Care: Hospital Recommended Level of Care: Skilled Nursing Facility Prior Approval Number:    Date Approved/Denied:   PASRR Number: [id number]  Discharge Plan: SNF    Current Diagnoses: Patient Active Problem List   Diagnosis Date Noted   Generalized weakness 07/26/2023   Protein-calorie malnutrition, severe 01/14/2023   Rhabdomyolysis 01/12/2023   Brain contusion (HCC) 01/12/2023   Fall at home, initial encounter 01/12/2023   Transaminitis 01/12/2023   UTI (urinary tract infection) 01/12/2023   Hypertension associated with diabetes (HCC) 02/20/2019   Hyperlipidemia associated with type 2 diabetes mellitus (HCC) 02/20/2019   Depression, recurrent (HCC) 02/20/2019   Type 2 diabetes mellitus without complication, without long-term current use of insulin (HCC) 09/26/2015   COPD (chronic obstructive pulmonary disease) (HCC) 05/29/2015   Dermatographia 05/16/2015   Rectal bleeding 03/07/2014   History of total left hip arthroplasty 01/25/2014   Edema 01/02/2014   Hypoxia 01/02/2014   S/P total hip arthroplasty 12/27/2013   Anxiety    Depression with anxiety    Rhinitis    Atrophic vaginitis    Postmenopausal     Orientation RESPIRATION BLADDER Height & Weight     Self, Place  Normal Continent Weight: 128 lb 15.5 oz (58.5 kg) Height:  5\' 2"  (157.5 cm)  BEHAVIORAL SYMPTOMS/MOOD NEUROLOGICAL BOWEL NUTRITION STATUS      Continent Diet (see dc summary)  AMBULATORY STATUS COMMUNICATION OF NEEDS Skin   Extensive Assist  Verbally PU Stage and Appropriate Care (coccyx)   PU Stage 2 Dressing: Daily                   Personal Care Assistance Level of Assistance  Bathing, Feeding, Dressing Bathing Assistance: Limited assistance Feeding assistance: Independent Dressing Assistance: Limited assistance     Functional Limitations Info  Sight, Hearing, Speech Sight Info: Adequate Hearing Info: Impaired Speech Info: Adequate    SPECIAL CARE FACTORS FREQUENCY  PT (By licensed PT), OT (By licensed OT)     PT Frequency: 5x week OT Frequency: 5x week            Contractures Contractures Info: Not present    Additional Factors Info  Code Status, Allergies Code Status Info: Full Allergies Info: Lisinopril, Chlordiazepoxide-clidinium, Clarithromycin, Penicillins, Povidone-iodine, Raloxifene Hydrochloride, Sertraline Hcl, Sulfacetamide Sodium, Sulfamethoxazole-trimethoprim, Betadine (Povidone Iodine), Erythromycin, Hydrogen Peroxide, Sertraline Hcl, Sulfa Antibiotics           Current Medications (07/27/2023):  This is the current hospital active medication list Current Facility-Administered Medications  Medication Dose Route Frequency Provider Last Rate Last Admin   acetaminophen (TYLENOL) tablet 650 mg  650 mg Oral Q6H PRN Emokpae, Ejiroghene E, MD   650 mg at 07/27/23 0736   Or   acetaminophen (TYLENOL) suppository 650 mg  650 mg Rectal Q6H PRN Emokpae, Ejiroghene E, MD       ALPRAZolam (XANAX) tablet 0.25 mg  0.25 mg Oral BID PRN Emokpae, Ejiroghene E, MD       amLODipine (NORVASC) tablet 2.5 mg  2.5 mg  Oral Daily Emokpae, Ejiroghene E, MD   2.5 mg at 07/27/23 0912   atenolol (TENORMIN) tablet 25 mg  25 mg Oral BID Emokpae, Ejiroghene E, MD   25 mg at 07/27/23 0913   cefTRIAXone (ROCEPHIN) 1 g in sodium chloride 0.9 % 100 mL IVPB  1 g Intravenous Q24H Emokpae, Ejiroghene E, MD       enoxaparin (LOVENOX) injection 40 mg  40 mg Subcutaneous Q24H Emokpae, Ejiroghene E, MD   40 mg at 07/26/23 2130    escitalopram (LEXAPRO) tablet 20 mg  20 mg Oral Daily Emokpae, Ejiroghene E, MD   20 mg at 07/27/23 0912   fluticasone furoate-vilanterol (BREO ELLIPTA) 200-25 MCG/ACT 1 puff  1 puff Inhalation Daily Emokpae, Ejiroghene E, MD   1 puff at 07/27/23 1040   gabapentin (NEURONTIN) capsule 100 mg  100 mg Oral QHS Emokpae, Ejiroghene E, MD   100 mg at 07/26/23 2130   insulin aspart (novoLOG) injection 0-5 Units  0-5 Units Subcutaneous QHS Emokpae, Ejiroghene E, MD       insulin aspart (novoLOG) injection 0-9 Units  0-9 Units Subcutaneous TID WC Emokpae, Ejiroghene E, MD       losartan (COZAAR) tablet 25 mg  25 mg Oral Daily Emokpae, Ejiroghene E, MD   25 mg at 07/27/23 1610   metFORMIN (GLUCOPHAGE) tablet 500 mg  500 mg Oral Q breakfast Emokpae, Ejiroghene E, MD   500 mg at 07/27/23 0912   ondansetron (ZOFRAN) tablet 4 mg  4 mg Oral Q6H PRN Emokpae, Ejiroghene E, MD       Or   ondansetron (ZOFRAN) injection 4 mg  4 mg Intravenous Q6H PRN Emokpae, Ejiroghene E, MD       polyethylene glycol (MIRALAX / GLYCOLAX) packet 17 g  17 g Oral Daily PRN Emokpae, Ejiroghene E, MD       tamsulosin (FLOMAX) capsule 0.4 mg  0.4 mg Oral Daily Emokpae, Ejiroghene E, MD   0.4 mg at 07/27/23 0911     Discharge Medications: Please see discharge summary for a list of discharge medications.  Relevant Imaging Results:  Relevant Lab Results:   Additional Information SSN: [id number] [place]

## 2023-07-27 NOTE — TOC Initial Note (Signed)
 Transition of Care [facility]) - Initial/Assessment Note    Patient Details  Name: [name] [name] MRN: [id number] Date of Birth: 02/15/1932  Transition of Care [facility]) CM/SW Contact:    Linnea Richards, LCSW Phone Number: 07/27/2023, 10:38 AM  Clinical Narrative:                  Pt admitted from home. PT/OT recommending SNF rehab at dc.  Spoke with pt and dtr and they are in agreement. Pt has been at [facility] in the past and requests to return.  Referral sent. Will start insurance auth and follow for dc planning.  Expected Discharge Plan: Skilled Nursing Facility Barriers to Discharge: Continued Medical Work up   Patient Goals and CMS Choice Patient states their goals for this hospitalization and ongoing recovery are:: get better CMS Medicare.gov Compare Post Acute Care list provided to:: Patient Represenative (must comment) Choice offered to / list presented to : Adult Children      Expected Discharge Plan and Services In-house Referral: Clinical Social Work   Post Acute Care Choice: Skilled Nursing Facility Living arrangements for the past 2 months: Single Family Home                                      Prior Living Arrangements/Services Living arrangements for the past 2 months: Single Family Home   Patient language and need for interpreter reviewed:: Yes Do you feel safe going back to the place where you live?: Yes      Need for Family Participation in Patient Care: Yes (Comment) Care giver support system in place?: Yes (comment)   Criminal Activity/Legal Involvement Pertinent to Current Situation/Hospitalization: No - Comment as needed  Activities of Daily Living   ADL Screening (condition at time of admission) Independently performs ADLs?: No Does the patient have a NEW difficulty with bathing/dressing/toileting/self-feeding that is expected to last >3 days?: No Does the patient have a NEW difficulty with getting in/out of bed, walking, or  climbing stairs that is expected to last >3 days?: No Does the patient have a NEW difficulty with communication that is expected to last >3 days?: No Is the patient deaf or have difficulty hearing?: No Does the patient have difficulty seeing, even when wearing glasses/contacts?: No Does the patient have difficulty concentrating, remembering, or making decisions?: Yes  Permission Sought/Granted Permission sought to share information with : Facility Industrial/product designer granted to share information with : Yes, Verbal Permission Granted     Permission granted to share info w AGENCY: snf        Emotional Assessment Appearance:: Appears stated age       Alcohol / Substance Use: Not Applicable Psych Involvement: No (comment)  Admission diagnosis:  Acute cystitis without hematuria [N30.00] Generalized weakness [R53.1] Patient Active Problem List   Diagnosis Date Noted   Generalized weakness 07/26/2023   Protein-calorie malnutrition, severe 01/14/2023   Rhabdomyolysis 01/12/2023   Brain contusion (HCC) 01/12/2023   Fall at home, initial encounter 01/12/2023   Transaminitis 01/12/2023   UTI (urinary tract infection) 01/12/2023   Hypertension associated with diabetes (HCC) 02/20/2019   Hyperlipidemia associated with type 2 diabetes mellitus (HCC) 02/20/2019   Depression, recurrent (HCC) 02/20/2019   Type 2 diabetes mellitus without complication, without long-term current use of insulin (HCC) 09/26/2015   COPD (chronic obstructive pulmonary disease) (HCC) 05/29/2015   Dermatographia 05/16/2015   Rectal bleeding 03/07/2014  History of total left hip arthroplasty 01/25/2014   Edema 01/02/2014   Hypoxia 01/02/2014   S/P total hip arthroplasty 12/27/2013   Anxiety    Depression with anxiety    Rhinitis    Atrophic vaginitis    Postmenopausal    PCP:  Wyn Heater, MD Pharmacy:   [facility] [place], [place] - [address]  [place] [address] Phone: [phone] Fax: [phone]     Social Drivers of Health (SDOH) Social History: SDOH Screenings   Food Insecurity: No Food Insecurity (07/26/2023)  Housing: Low Risk  (07/26/2023)  Transportation Needs: No Transportation Needs (07/26/2023)  Utilities: Not At Risk (07/26/2023)  Depression (PHQ2-9): Medium Risk (04/21/2019)  Financial Resource Strain: Low Risk  (12/08/2017)  Physical Activity: Inactive (12/12/2018)  Social Connections: Moderately Integrated (07/26/2023)  Stress: No Stress Concern Present (12/08/2017)  Tobacco Use: Low Risk  (07/26/2023)   SDOH Interventions:     Readmission Risk Interventions    01/12/2023    3:12 PM  Readmission Risk Prevention Plan  Post Dischage Appt Not Complete  Medication Screening Complete  Transportation Screening Complete

## 2023-07-28 DIAGNOSIS — K5900 Constipation, unspecified: Secondary | ICD-10-CM | POA: Diagnosis not present

## 2023-07-28 DIAGNOSIS — R54 Age-related physical debility: Secondary | ICD-10-CM | POA: Diagnosis not present

## 2023-07-28 DIAGNOSIS — R278 Other lack of coordination: Secondary | ICD-10-CM | POA: Diagnosis not present

## 2023-07-28 DIAGNOSIS — I10 Essential (primary) hypertension: Secondary | ICD-10-CM | POA: Diagnosis not present

## 2023-07-28 DIAGNOSIS — M1990 Unspecified osteoarthritis, unspecified site: Secondary | ICD-10-CM | POA: Diagnosis not present

## 2023-07-28 DIAGNOSIS — R600 Localized edema: Secondary | ICD-10-CM | POA: Diagnosis not present

## 2023-07-28 DIAGNOSIS — G8929 Other chronic pain: Secondary | ICD-10-CM | POA: Diagnosis not present

## 2023-07-28 DIAGNOSIS — R5381 Other malaise: Secondary | ICD-10-CM | POA: Diagnosis not present

## 2023-07-28 DIAGNOSIS — Z79899 Other long term (current) drug therapy: Secondary | ICD-10-CM | POA: Diagnosis not present

## 2023-07-28 DIAGNOSIS — L89152 Pressure ulcer of sacral region, stage 2: Secondary | ICD-10-CM | POA: Diagnosis not present

## 2023-07-28 DIAGNOSIS — F32A Depression, unspecified: Secondary | ICD-10-CM | POA: Diagnosis not present

## 2023-07-28 DIAGNOSIS — E872 Acidosis, unspecified: Secondary | ICD-10-CM

## 2023-07-28 DIAGNOSIS — M25569 Pain in unspecified knee: Secondary | ICD-10-CM | POA: Diagnosis not present

## 2023-07-28 DIAGNOSIS — Z7984 Long term (current) use of oral hypoglycemic drugs: Secondary | ICD-10-CM | POA: Diagnosis not present

## 2023-07-28 DIAGNOSIS — H6121 Impacted cerumen, right ear: Secondary | ICD-10-CM | POA: Diagnosis not present

## 2023-07-28 DIAGNOSIS — E119 Type 2 diabetes mellitus without complications: Secondary | ICD-10-CM | POA: Diagnosis not present

## 2023-07-28 DIAGNOSIS — F418 Other specified anxiety disorders: Secondary | ICD-10-CM | POA: Diagnosis not present

## 2023-07-28 DIAGNOSIS — Z96642 Presence of left artificial hip joint: Secondary | ICD-10-CM | POA: Diagnosis not present

## 2023-07-28 DIAGNOSIS — E871 Hypo-osmolality and hyponatremia: Secondary | ICD-10-CM

## 2023-07-28 DIAGNOSIS — J449 Chronic obstructive pulmonary disease, unspecified: Secondary | ICD-10-CM | POA: Diagnosis not present

## 2023-07-28 DIAGNOSIS — D500 Iron deficiency anemia secondary to blood loss (chronic): Secondary | ICD-10-CM | POA: Diagnosis not present

## 2023-07-28 DIAGNOSIS — D649 Anemia, unspecified: Secondary | ICD-10-CM | POA: Diagnosis not present

## 2023-07-28 DIAGNOSIS — M6281 Muscle weakness (generalized): Secondary | ICD-10-CM | POA: Diagnosis not present

## 2023-07-28 DIAGNOSIS — G9341 Metabolic encephalopathy: Secondary | ICD-10-CM | POA: Diagnosis not present

## 2023-07-28 DIAGNOSIS — R41841 Cognitive communication deficit: Secondary | ICD-10-CM | POA: Diagnosis not present

## 2023-07-28 DIAGNOSIS — E1169 Type 2 diabetes mellitus with other specified complication: Secondary | ICD-10-CM | POA: Diagnosis not present

## 2023-07-28 DIAGNOSIS — R2681 Unsteadiness on feet: Secondary | ICD-10-CM | POA: Diagnosis not present

## 2023-07-28 DIAGNOSIS — Z794 Long term (current) use of insulin: Secondary | ICD-10-CM | POA: Diagnosis not present

## 2023-07-28 DIAGNOSIS — R638 Other symptoms and signs concerning food and fluid intake: Secondary | ICD-10-CM | POA: Diagnosis not present

## 2023-07-28 DIAGNOSIS — R7401 Elevation of levels of liver transaminase levels: Secondary | ICD-10-CM | POA: Diagnosis not present

## 2023-07-28 DIAGNOSIS — J309 Allergic rhinitis, unspecified: Secondary | ICD-10-CM | POA: Diagnosis not present

## 2023-07-28 DIAGNOSIS — N390 Urinary tract infection, site not specified: Secondary | ICD-10-CM | POA: Diagnosis not present

## 2023-07-28 DIAGNOSIS — I610 Nontraumatic intracerebral hemorrhage in hemisphere, subcortical: Secondary | ICD-10-CM | POA: Diagnosis not present

## 2023-07-28 DIAGNOSIS — E1159 Type 2 diabetes mellitus with other circulatory complications: Secondary | ICD-10-CM | POA: Diagnosis not present

## 2023-07-28 DIAGNOSIS — E43 Unspecified severe protein-calorie malnutrition: Secondary | ICD-10-CM | POA: Diagnosis not present

## 2023-07-28 DIAGNOSIS — R339 Retention of urine, unspecified: Secondary | ICD-10-CM | POA: Diagnosis not present

## 2023-07-28 DIAGNOSIS — F419 Anxiety disorder, unspecified: Secondary | ICD-10-CM | POA: Diagnosis not present

## 2023-07-28 DIAGNOSIS — Z7401 Bed confinement status: Secondary | ICD-10-CM | POA: Diagnosis not present

## 2023-07-28 DIAGNOSIS — N3000 Acute cystitis without hematuria: Secondary | ICD-10-CM | POA: Diagnosis not present

## 2023-07-28 DIAGNOSIS — J410 Simple chronic bronchitis: Secondary | ICD-10-CM | POA: Diagnosis not present

## 2023-07-28 DIAGNOSIS — L988 Other specified disorders of the skin and subcutaneous tissue: Secondary | ICD-10-CM | POA: Diagnosis not present

## 2023-07-28 DIAGNOSIS — W19XXXA Unspecified fall, initial encounter: Secondary | ICD-10-CM | POA: Diagnosis not present

## 2023-07-28 DIAGNOSIS — Z741 Need for assistance with personal care: Secondary | ICD-10-CM | POA: Diagnosis not present

## 2023-07-28 DIAGNOSIS — R531 Weakness: Secondary | ICD-10-CM | POA: Diagnosis not present

## 2023-07-28 DIAGNOSIS — R1312 Dysphagia, oropharyngeal phase: Secondary | ICD-10-CM | POA: Diagnosis not present

## 2023-07-28 DIAGNOSIS — E785 Hyperlipidemia, unspecified: Secondary | ICD-10-CM | POA: Diagnosis not present

## 2023-07-28 LAB — CBC WITH DIFFERENTIAL/PLATELET
Abs Immature Granulocytes: 0.02 10*3/uL (ref 0.00–0.07)
Basophils Absolute: 0 10*3/uL (ref 0.0–0.1)
Basophils Relative: 0 %
Eosinophils Absolute: 0 10*3/uL (ref 0.0–0.5)
Eosinophils Relative: 0 %
HCT: 34.9 % — ABNORMAL LOW (ref 36.0–46.0)
Hemoglobin: 11.8 g/dL — ABNORMAL LOW (ref 12.0–15.0)
Immature Granulocytes: 0 %
Lymphocytes Relative: 23 %
Lymphs Abs: 1.8 10*3/uL (ref 0.7–4.0)
MCH: 32.6 pg (ref 26.0–34.0)
MCHC: 33.8 g/dL (ref 30.0–36.0)
MCV: 96.4 fL (ref 80.0–100.0)
Monocytes Absolute: 0.4 10*3/uL (ref 0.1–1.0)
Monocytes Relative: 5 %
Neutro Abs: 5.5 10*3/uL (ref 1.7–7.7)
Neutrophils Relative %: 72 %
Platelets: 266 10*3/uL (ref 150–400)
RBC: 3.62 MIL/uL — ABNORMAL LOW (ref 3.87–5.11)
RDW: 12.7 % (ref 11.5–15.5)
WBC: 7.8 10*3/uL (ref 4.0–10.5)
nRBC: 0 % (ref 0.0–0.2)

## 2023-07-28 LAB — COMPREHENSIVE METABOLIC PANEL WITH GFR
ALT: 14 U/L (ref 0–44)
AST: 23 U/L (ref 15–41)
Albumin: 3.7 g/dL (ref 3.5–5.0)
Alkaline Phosphatase: 59 U/L (ref 38–126)
Anion gap: 14 (ref 5–15)
BUN: 13 mg/dL (ref 8–23)
CO2: 20 mmol/L — ABNORMAL LOW (ref 22–32)
Calcium: 9.2 mg/dL (ref 8.9–10.3)
Chloride: 99 mmol/L (ref 98–111)
Creatinine, Ser: 0.51 mg/dL (ref 0.44–1.00)
GFR, Estimated: >60 mL/min (ref >60)
Glucose, Bld: 167 mg/dL — ABNORMAL HIGH (ref 70–99)
Potassium: 4.2 mmol/L (ref 3.5–5.1)
Sodium: 133 mmol/L — ABNORMAL LOW (ref 135–145)
Total Bilirubin: 0.9 mg/dL (ref 0.0–1.2)
Total Protein: 6.8 g/dL (ref 6.5–8.1)

## 2023-07-28 LAB — MAGNESIUM: Magnesium: 1.6 mg/dL — ABNORMAL LOW (ref 1.7–2.4)

## 2023-07-28 LAB — PHOSPHORUS: Phosphorus: 3 mg/dL (ref 2.5–4.6)

## 2023-07-28 LAB — GLUCOSE, CAPILLARY
Glucose-Capillary: 122 mg/dL — ABNORMAL HIGH (ref 70–99)
Glucose-Capillary: 105 mg/dL — ABNORMAL HIGH (ref 70–99)

## 2023-07-28 MED ORDER — POLYETHYLENE GLYCOL 3350 17 G PO PACK: 17.0000 g | PACK | Freq: Every day | ORAL | 0 refills | Status: DC | PRN

## 2023-07-28 MED ORDER — ENSURE ENLIVE PO LIQD: 237.0000 mL | Freq: Two times a day (BID) | ORAL | Status: DC

## 2023-07-28 MED ORDER — ZINC SULFATE 220 (50 ZN) MG PO CAPS: 220.0000 mg | ORAL_CAPSULE | Freq: Every day | ORAL | 0 refills | Status: AC

## 2023-07-28 MED ORDER — SODIUM CHLORIDE 0.9 % IV SOLN: INTRAVENOUS | Status: DC

## 2023-07-28 MED ORDER — MAGNESIUM SULFATE 2 GM/50ML IV SOLN: 2.0000 g | Freq: Once | INTRAVENOUS | Status: DC

## 2023-07-28 MED ORDER — ZINC SULFATE 220 (50 ZN) MG PO CAPS
220.0000 mg | ORAL_CAPSULE | Freq: Every day | ORAL | Status: DC
  Administered 2023-07-28: 220 mg via ORAL
  Filled 2023-07-28: qty 1

## 2023-07-28 MED ORDER — ACETAMINOPHEN 325 MG PO TABS: 650.0000 mg | ORAL_TABLET | Freq: Four times a day (QID) | ORAL | Status: AC | PRN

## 2023-07-28 MED ORDER — ONDANSETRON HCL 4 MG PO TABS: 4.0000 mg | ORAL_TABLET | Freq: Four times a day (QID) | ORAL | 0 refills | Status: DC | PRN

## 2023-07-28 MED ORDER — VITAMIN C 500 MG PO TABS
500.0000 mg | ORAL_TABLET | Freq: Two times a day (BID) | ORAL | Status: DC
  Administered 2023-07-28: 500 mg via ORAL
  Filled 2023-07-28: qty 1

## 2023-07-28 MED ORDER — SODIUM CHLORIDE 0.9 % IV BOLUS
500.0000 mL | Freq: Once | INTRAVENOUS | Status: AC
  Administered 2023-07-28: 500 mL via INTRAVENOUS

## 2023-07-28 MED ORDER — SODIUM CHLORIDE 0.9 % IV SOLN: 1.0000 g | INTRAVENOUS | Status: DC

## 2023-07-28 MED ORDER — DICLOFENAC SODIUM 1 % EX GEL: 2.0000 g | Freq: Four times a day (QID) | CUTANEOUS | Status: AC

## 2023-07-28 MED ORDER — ADULT MULTIVITAMIN W/MINERALS CH
1.0000 | ORAL_TABLET | Freq: Every day | ORAL | Status: DC
  Administered 2023-07-28: 1 via ORAL
  Filled 2023-07-28: qty 1

## 2023-07-28 MED ORDER — ASCORBIC ACID 500 MG PO TABS: 500.0000 mg | ORAL_TABLET | Freq: Two times a day (BID) | ORAL | Status: AC

## 2023-07-28 MED ORDER — ALPRAZOLAM 0.25 MG PO TABS: 0.2500 mg | ORAL_TABLET | Freq: Two times a day (BID) | ORAL | 0 refills | Status: DC | PRN

## 2023-07-28 MED ORDER — ENSURE ENLIVE PO LIQD: 237.0000 mL | Freq: Two times a day (BID) | ORAL | 12 refills | Status: AC

## 2023-07-28 NOTE — Hospital Course (Addendum)
 The patient is an 89 y.o. female with medical history significant for COPD, hypertension, diabetes mellitus and other comorbidities.  Patient was brought to the ED with reports of generalized weakness and a fall.  Patient fell 2 nights ago- 4/12.  She was using her walker when she fell, she reports her legs just felt weak and gave way.  She has chronic weakness to her bilateral lower extremities.  She reports chronic pain also to her hips and lower extremities.  Patient lives with family, with progressive generalized weakness, and patient requiring increasing care, then able to meet the demands of patient's care.   She was in ED 4/6 with same complaint of weakness, she was diagnosed with a UTI and given a dose of fosfomycin.  Urine cultures grew E. Coli.   UA with positive nitrites for bacteria.  Pelvic x-ray-no acute fracture or dislocation, shows severe degenerative changes to right hip joint.  Chest x-ray without acute abnormality.  Brain MRI-no acute abnormality, shows chronic small vessel ischemic changes. She was given IVF and PT/OT evaluated and recommended SNF. She is medically stable to D/C and will need close f/u with PCP within 1-2 weeks.  Assessment and Plan:  Generalized weakness and Physical Deconditioning: Chronic weakness of bilateral extremities, has osteoarthritis pain in both knees -X-ray showed degenerative reactive changes in right hip joint   Diagnosed with UTI 4/4 and was given a dose of fosfomycin, urine cultures grew E. coli sensitive to ceftriaxone.  UA today with positive nitrites, few bacteria. Follow blood and urine cultures ordered in ED and Urine Cx showing multiple species. S/p  IV Ceftriaxone x 3 days. Given 500 mL bolus and started on mIVF with NS @ 75 today until D/C. PT/OT recommending SNF. Check Orthostatics at Facility but will send with TED Hose   Fall at home, initial encounter: Mechanical fall secondary to weakness of lower extremity. MRI brain negative for acute  abnormality. PT/OT recommending SNF.    Hypertension associated with diabetes [facility]): Blood pressure stable.Resumed Atenolol 25 mg po BID, Amlodipine 2.5 mg po daily Losartan 25 mg po Daily, and Tamsulosin 0.4 mg po Daily. CTM BP per Protocol. Last BP reading was 137/54   Type 2 Diabetes mellitus without complication, without long-term current use of insulin (HCC): Controlled. HbA1c 6.4. Hold Metformin (was resumed yesterday but I discontinued it now). C/w Sensitive Novolog SSI AC/HS. CBG well-controlled and ranging from 119-149 on last 5 checks  Hyponatremia: Mild. Na+ is now 133. IVF as above. CTM and Trend and repeat CMP w/in 1 week  Metabolic Acidosis: Mild. CO2 is 20, AG is 14, Chloride Level is now 99. IVF as above. CTM and Trend and repeat CMP w/in 1 week   COPD (chronic obstructive pulmonary disease) (HCC): Stable.  Resume home regimen and c/w Breo-Ellipta 1 puff IH  Hypomagnesemia: Mag Level was 1.6. Replete with IV Mag Sulfate 2 grams. CTM and Replete as Necessary and repeat Mag Level w/in 1 week   Depression with Anxiety: Stable. C/w Alrpazolam 0.25 mg po BIDprn Anxiety and Escitalopram 20 mg po Daily  Normocytic Anemia: Hgb/Hct went from 12.2 -> 11.8/34.9.? Dilutional Drop. Check Anemia Panel in the outpatient setting. CTM for S/Sx of Bleeding; No overt bleeding noted. Repeat CBC within 1 week  Poor po Intake: Nutritionist consult and they are recommending: -Liberalize diet to regular diet for widest variety of meal selections -Ensure Enlive po BID, each supplement provides 350 kcal and 20 grams of protein -Magic cup BID with meals, each  supplement provides 290 kcal and 9 grams of protein  -MVI with minerals daily -500 mg vitamin C BID -220 mg zinc sulfate daily x 14 days

## 2023-07-28 NOTE — TOC Transition Note (Signed)
 Transition of Care [facility]) - Discharge Note   Patient Details  Name: [name] [name] MRN: [id number] Date of Birth: [birth year]  [facility]) CM/SW Contact:  Linnea Richards, LCSW Phone Number: 07/28/2023, 12:42 PM   Clinical Narrative:     Pt stable for dc per MD. SNF auth received from insurance. [facility] can accept pt today.  Updated pt's dtr, Isa Manuel, who remains in agreement with the dc plan.  DC clinical sent electronically. RN to call report. EMS arranged.  No other TOC needs for dc.   Final next level of care: Skilled Nursing Facility Barriers to Discharge: Barriers Resolved   Patient Goals and CMS Choice Patient states their goals for this hospitalization and ongoing recovery are:: get better CMS Medicare.gov Compare Post Acute Care list provided to:: Patient Represenative (must comment) Choice offered to / list presented to : Adult Children      Discharge Placement              Patient chooses bed at: [facility] Patient to be transferred to facility by: EMS Name of family member notified: [name] Patient and family notified of of transfer: 07/28/23  Discharge Plan and Services Additional resources added to the After Visit Summary for   In-house Referral: Clinical Social Work   Post Acute Care Choice: Skilled Nursing Facility                               Social Drivers of Health (SDOH) Interventions SDOH Screenings   Food Insecurity: No Food Insecurity (07/26/2023)  Housing: Low Risk  (07/26/2023)  Transportation Needs: No Transportation Needs (07/26/2023)  Utilities: Not At Risk (07/26/2023)  Depression (PHQ2-9): Medium Risk (04/21/2019)  Financial Resource Strain: Low Risk  (12/08/2017)  Physical Activity: Inactive (12/12/2018)  Social Connections: Moderately Integrated (07/26/2023)  Stress: No Stress Concern Present (12/08/2017)  Tobacco Use: Low Risk  (07/26/2023)     Readmission Risk Interventions    01/12/2023     3:12 PM  Readmission Risk Prevention Plan  Post Dischage Appt Not Complete  Medication Screening Complete  Transportation Screening Complete

## 2023-07-28 NOTE — Progress Notes (Signed)
 Initial Nutrition Assessment  DOCUMENTATION CODES:   Not applicable  INTERVENTION:   -Liberalize diet to regular diet for widest variety of meal selections -Ensure Enlive po BID, each supplement provides 350 kcal and 20 grams of protein -Magic cup BID with meals, each supplement provides 290 kcal and 9 grams of protein  -MVI with minerals daily -500 mg vitamin C BID -220 mg zinc sulfate daily x 14 days  NUTRITION DIAGNOSIS:   Increased nutrient needs related to wound healing as evidenced by estimated needs.  GOAL:   Patient will meet greater than or equal to 90% of their needs  MONITOR:   PO intake, Supplement acceptance  REASON FOR ASSESSMENT:   Consult Assessment of nutrition requirement/status, Poor PO  ASSESSMENT:   Pt with medical history significant for COPD, hypertension, diabetes mellitus and depression. Pt admitted with generalized weakness and fall.  Pt admitted with generalized weakness likely related to increasing frailty, debility, and advanced age.   Reviewed I/O's: +120 ml x 24 hours  UOP: 700 ml x 24 hours  Pt unavailable at time of visit. Attempted to speak with pt via call to hospital room phone, however, unable to reach. RD unable to obtain further nutrition-related history or complete nutrition-focused physical exam at this time.    Per MD notes, x-ray reveals severe degenerative reactive changes in right hip joint. Brain MRI reveals no acute abnormality.   Pt currently on a heart healthy, carb modified diet. Noted meal completions 40-50%.   Reviewed wt hx; pt has experienced a 2.7% wt loss over the past 4 months, which is not significant for time frame.   Pt with history of severe malnutrition from prior hospitalization. Highly suspect malnutrition is ongoing, but unable to identify at this time. Pt would greatly benefit from addition of oral nutrition supplements.   Per TOC notes, plan for SNF placement. Pt awaiting insurance authorization.    Medications reviewed and include vitamin C, neurontin, zinc sulfate, and 0.9% sodium chloride infusion @ 75 ml/hr.   Lab Results  Component Value Date   HGBA1C 6.4 (H) 01/12/2023   PTA DM medications are 500 mg metformin daily.   Labs reviewed: Na: 133, Mg: 1.6, CBGS: 119-149 (inpatient orders for glycemic control are 0-5 units insulin aspart daily at bedtime and 0-9 units insulin aspart TID with meals).    Diet Order:   Diet Order             Diet regular Room service appropriate? Yes; Fluid consistency: Thin  Diet effective now                   EDUCATION NEEDS:   No education needs have been identified at this time  Skin:  Skin Assessment: Skin Integrity Issues: Skin Integrity Issues:: Stage II Stage II: coccyx  Last BM:  07/26/23  Height:   Ht Readings from Last 1 Encounters:  07/26/23 5\' 2"  (1.575 m)    Weight:   Wt Readings from Last 1 Encounters:  07/26/23 58.5 kg    Ideal Body Weight:  50 kg  BMI:  Body mass index is 23.59 kg/m.  Estimated Nutritional Needs:   Kcal:  1550-1750  Protein:  75-90 grams  Fluid:  1.5-1.7 L    Levada Schilling, RD, LDN, CDCES Registered Dietitian III Certified Diabetes Care and Education Specialist If unable to reach this RD, please use "RD Inpatient" group chat on secure chat between hours of 8am-4 pm daily

## 2023-07-28 NOTE — Discharge Summary (Signed)
 Physician Discharge Summary   Patient: [name] [name] MRN: [id number] DOB: 09-21-1931  Admit date:     07/26/2023  Discharge date: 07/28/23  Discharge Physician: Aura Leeds, DO   PCP: Wyn Heater, MD   Recommendations at discharge:   F/U with [facility] w/in 1-2 weeks and repeat CBC,CMP,Mag,Phos w/in 1 week Check Anemia Panel in the outpt Setting   Discharge Diagnoses: Principal Problem:   Generalized weakness Active Problems:   Fall at home, initial encounter   Depression with anxiety   COPD (chronic obstructive pulmonary disease) (HCC)   Type 2 diabetes mellitus without complication, without long-term current use of insulin (HCC)   Hypertension associated with diabetes (HCC)  Resolved Problems:   * No resolved hospital problems. [facility] Course: The patient is an 89 y.o. female with medical history significant for COPD, hypertension, diabetes mellitus and other comorbidities.  Patient was brought to the ED with reports of generalized weakness and a fall.  Patient fell 2 nights ago- 4/12.  She was using her walker when she fell, she reports her legs just felt weak and gave way.  She has chronic weakness to her bilateral lower extremities.  She reports chronic pain also to her hips and lower extremities.  Patient lives with family, with progressive generalized weakness, and patient requiring increasing care, then able to meet the demands of patient's care.   She was in ED 4/6 with same complaint of weakness, she was diagnosed with a UTI and given a dose of fosfomycin.  Urine cultures grew E. Coli.   UA with positive nitrites for bacteria.  Pelvic x-ray-no acute fracture or dislocation, shows severe degenerative changes to right hip joint.  Chest x-ray without acute abnormality.  Brain MRI-no acute abnormality, shows chronic small vessel ischemic changes. She was given IVF and PT/OT evaluated and recommended SNF. She is medically stable to D/C and will need close f/u with PCP within  1-2 weeks.  Assessment and Plan:  Generalized weakness and Physical Deconditioning: Chronic weakness of bilateral extremities, has osteoarthritis pain in both knees -X-ray showed degenerative reactive changes in right hip joint   Diagnosed with UTI 4/4 and was given a dose of fosfomycin, urine cultures grew E. coli sensitive to ceftriaxone.  UA today with positive nitrites, few bacteria. Follow blood and urine cultures ordered in ED and Urine Cx showing multiple species. S/p  IV Ceftriaxone x 3 days. Given 500 mL bolus and started on mIVF with NS @ 75 today until D/C. PT/OT recommending SNF. Check Orthostatics at Facility but will send with TED Hose   Fall at home, initial encounter: Mechanical fall secondary to weakness of lower extremity. MRI brain negative for acute abnormality. PT/OT recommending SNF.    Hypertension associated with diabetes [facility]): Blood pressure stable.Resumed Atenolol 25 mg po BID, Amlodipine 2.5 mg po daily Losartan 25 mg po Daily, and Tamsulosin 0.4 mg po Daily. CTM BP per Protocol. Last BP reading was 137/54   Type 2 Diabetes mellitus without complication, without long-term current use of insulin (HCC): Controlled. HbA1c 6.4. Hold Metformin (was resumed yesterday but I discontinued it now). C/w Sensitive Novolog SSI AC/HS. CBG well-controlled and ranging from 119-149 on last 5 checks  Hyponatremia: Mild. Na+ is now 133. IVF as above. CTM and Trend and repeat CMP w/in 1 week  Metabolic Acidosis: Mild. CO2 is 20, AG is 14, Chloride Level is now 99. IVF as above. CTM and Trend and repeat CMP w/in 1 week   COPD (chronic obstructive  pulmonary disease) (HCC): Stable.  Resume home regimen and c/w Breo-Ellipta 1 puff IH  Hypomagnesemia: Mag Level was 1.6. Replete with IV Mag Sulfate 2 grams. CTM and Replete as Necessary and repeat Mag Level w/in 1 week   Depression with Anxiety: Stable. C/w Alrpazolam 0.25 mg po BIDprn Anxiety and Escitalopram 20 mg po Daily  Normocytic  Anemia: Hgb/Hct went from 12.2 -> 11.8/34.9.? Dilutional Drop. Check Anemia Panel in the outpatient setting. CTM for S/Sx of Bleeding; No overt bleeding noted. Repeat CBC within 1 week  Poor po Intake: Nutritionist consult and they are recommending: -Liberalize diet to regular diet for widest variety of meal selections -Ensure Enlive po BID, each supplement provides 350 kcal and 20 grams of protein -Magic cup BID with meals, each supplement provides 290 kcal and 9 grams of protein  -MVI with minerals daily -500 mg vitamin C BID -220 mg zinc sulfate daily x 14 days  Nutrition Documentation    Flowsheet Row ED to Hosp-Admission (Current) from 07/26/2023 in [facility] SURGICAL UNIT  Nutrition Problem Increased nutrient needs  Etiology wound healing  Nutrition Goal Patient will meet greater than or equal to 90% of their needs  Interventions Ensure Enlive (each supplement provides 350kcal and 20 grams of protein), MVI, Liberalize Diet, Magic cup      Pressure Ulcer, poA  Active Pressure Injury/Wound(s)     Pressure Ulcer  Duration          Pressure Injury 07/27/23 Coccyx Stage 2 -  Partial thickness loss of dermis presenting as a shallow open injury with a red, pink wound bed without slough. 1 day           Consultants: None Procedures performed: As delineated as above  Disposition: Skilled nursing facility Diet recommendation:  Regular diet DISCHARGE MEDICATION: Allergies as of 07/28/2023       Reactions   Lisinopril Cough, Other (See Comments)   Chlordiazepoxide-clidinium    Can't remember   Clarithromycin    Can't remember   Penicillins    Can't remember   Povidone-iodine Other (See Comments)   Raloxifene Hydrochloride Other (See Comments)   Sertraline Hcl Other (See Comments)   Sulfacetamide Sodium Nausea And Vomiting   Sulfamethoxazole-trimethoprim    Betadine [povidone Iodine] Rash   Erythromycin Itching, Other (See Comments)   Can't remember   Hydrogen  Peroxide Rash, Other (See Comments)   Sertraline Hcl Other (See Comments)   Sweating and mouth irritation   Sulfa Antibiotics Nausea And Vomiting, Other (See Comments)        Medication List     TAKE these medications    acetaminophen 325 MG tablet Commonly known as: TYLENOL Take 2 tablets (650 mg total) by mouth every 6 (six) hours as needed for mild pain (pain score 1-3) (or Fever >/= 101). What changed:  medication strength how much to take reasons to take this   ALPRAZolam 0.25 MG tablet Commonly known as: XANAX Take 1 tablet (0.25 mg total) by mouth 2 (two) times daily as needed for anxiety.   amLODipine 2.5 MG tablet Commonly known as: NORVASC Take 1 tablet (2.5 mg total) by mouth daily.   ascorbic acid 500 MG tablet Commonly known as: VITAMIN C Take 1 tablet (500 mg total) by mouth 2 (two) times daily.   atenolol 25 MG tablet Commonly known as: TENORMIN Take 25 mg by mouth 2 (two) times daily.   blood glucose meter kit and supplies Kit Check BS once daily   budesonide-formoterol  160-4.5 MCG/ACT inhaler Commonly known as: SYMBICORT Inhale 2 puffs into the lungs 2 (two) times daily.   cholecalciferol 1000 units tablet Commonly known as: VITAMIN D Take 2,000 Units by mouth daily.   cloNIDine 0.1 MG tablet Commonly known as: CATAPRES Take 1 tablet (0.1 mg total) by mouth at bedtime as needed.   diclofenac Sodium 1 % Gel Commonly known as: VOLTAREN Apply 2 g topically 4 (four) times daily.   escitalopram 20 MG tablet Commonly known as: LEXAPRO Take 1 tablet (20 mg total) by mouth daily. as directed   feeding supplement Liqd Take 237 mLs by mouth 2 (two) times daily between meals.   fluticasone 50 MCG/ACT nasal spray Commonly known as: FLONASE USE ONE SPRAY in each nostril ONCE daily What changed:  how much to take how to take this when to take this   gabapentin 100 MG capsule Commonly known as: NEURONTIN Take 1 capsule (100 mg total) by mouth  at bedtime.   ketoconazole 2 % shampoo Commonly known as: NIZORAL Apply 1 application topically 2 (two) times a week.   losartan 25 MG tablet Commonly known as: COZAAR Take 25 mg by mouth daily.   lovastatin 40 MG tablet Commonly known as: MEVACOR Take 1 tablet (40 mg total) by mouth daily.   Melatonin 5 MG Caps Take 1 capsule (5 mg total) by mouth at bedtime.   metFORMIN 500 MG 24 hr tablet Commonly known as: GLUCOPHAGE-XR 1 tablet once daily What changed:  how much to take how to take this when to take this   multivitamin with minerals Tabs tablet Take 1 tablet by mouth daily.   neomycin-bacitracin-polymyxin [id number] ointment Apply 1 Application topically daily as needed (infection).   ondansetron 4 MG tablet Commonly known as: ZOFRAN Take 1 tablet (4 mg total) by mouth every 6 (six) hours as needed for nausea.   ONE TOUCH DELICA LANCING DEV Misc Use to check BG up to twice a day. DX: E11.29 - type 2 DM   onetouch ultrasoft lancets CHECK GLUCOSE TWICE DAILY AS NEEDED Dx E11.9   OneTouch Verio Flex System w/Device Kit CHECK GLUCOSE TWICE DAILY AS NEEDED Dx E11.9   OneTouch Verio test strip Generic drug: glucose blood CHECK GLUCOSE TWICE DAILY AS NEEDED Dx E11.9   polyethylene glycol 17 g packet Commonly known as: MIRALAX / GLYCOLAX Take 17 g by mouth daily as needed for mild constipation.   Super Calcium 1500 (600 Ca) MG Tabs tablet Generic drug: calcium carbonate Take 600 mg of elemental calcium by mouth daily with breakfast.   tamsulosin 0.4 MG Caps capsule Commonly known as: FLOMAX Take 0.4 mg by mouth daily.   zinc sulfate (50mg  elemental zinc) 220 (50 Zn) MG capsule Take 1 capsule (220 mg total) by mouth daily for 14 days.        Contact information for after-discharge care     Destination     [facility] Preferred SNF .   Service: Skilled Nursing Contact information: [address] [place] [address] [phone]                    Discharge Exam: Ceasar Mons Weights   07/26/23 1250  Weight: 58.5 kg   Vitals:   07/28/23 0525 07/28/23 0734  BP: (!) 137/54   Pulse: 65   Resp: 18   Temp: 98.1 F (36.7 C)   SpO2: 92% 93%   Examination: Physical Exam:  Constitutional: Thin elderly  Caucasian female who appears anxious Respiratory: Diminished to auscultation bilaterally, no wheezing, rales, rhonchi or crackles. Normal respiratory effort and patient is not tachypenic. No accessory muscle use. Unlabored breathing  Cardiovascular: RRR, no murmurs / rubs / gallops. S1 and S2 auscultated. No extremity edema. Abdomen: Soft, non-tender, non-distended. Bowel sounds positive.  GU: Deferred. Musculoskeletal: No clubbing / cyanosis of digits/nails. No joint deformity upper and lower extremities. Skin: No rashes, lesions, ulcers on a limited skin evaluation. No induration; Warm and dry.  Neurologic: CN 2-12 grossly intact with no focal deficits. Has Tremors noted Psychiatric: Normal judgment and insight. Awake and alert. Anxious mood  Condition at discharge: stable  The results of significant diagnostics from this hospitalization (including imaging, microbiology, ancillary and laboratory) are listed below for reference.   Imaging Studies: MR BRAIN WO CONTRAST Result Date: 07/26/2023 CLINICAL DATA:  Provided history: Weakness. EXAM: MRI HEAD WITHOUT CONTRAST TECHNIQUE: Multiplanar, multiecho pulse sequences of the brain and surrounding structures were obtained without intravenous contrast. COMPARISON:  Head CT 03/27/2023. FINDINGS: Brain: Mild generalized cerebral atrophy. Multifocal T2 FLAIR hyperintense signal abnormality within the cerebral white matter and pons, nonspecific but compatible with mild chronic small vessel ischemic disease. Known small nonspecific parenchymal calcification within the posterior left frontal lobe, occult by MRI and better  appreciated on the prior head CT of 03/27/2023. There is no acute infarct. No evidence of an intracranial mass. No chronic intracranial blood products. No extra-axial fluid collection. No midline shift. Vascular: Maintained flow voids within the proximal large arterial vessels. Skull and upper cervical spine: No focal worrisome marrow lesion. Incompletely assessed cervical spondylosis. Sinuses/Orbits: No mass or acute finding within the imaged orbits. Prior bilateral ocular lens replacement. No significant paranasal sinus disease. IMPRESSION: 1.  No evidence of an acute intracranial abnormality. 2. Mild chronic small vessel ischemic changes within the cerebral white matter and pons. 3. Mild generalized cerebral atrophy. Electronically Signed   By: Jackey Loge D.O.   On: 07/26/2023 16:32   DG Pelvis Portable Result Date: 07/26/2023 CLINICAL DATA:  Fall.  Weakness. EXAM: PORTABLE PELVIS 1-2 VIEWS COMPARISON:  None Available. FINDINGS: Pelvis is intact with normal and symmetric sacroiliac joints. No acute fracture or dislocation. No aggressive osseous lesion. Visualized sacral arcuate lines are unremarkable. Unremarkable symphysis pubis. There are severe degenerative changes of the right hip joint in the form of reduced joint space, subchondral sclerosis / cystic changes and osteophytosis. Postsurgical changes from prior left hip arthroplasty noted. No radiopaque foreign bodies. IMPRESSION: *No acute fracture or dislocation. Severe degenerative changes of the right hip joint. Electronically Signed   By: Jules Schick M.D.   On: 07/26/2023 13:51   DG Chest Portable 1 View Result Date: 07/26/2023 CLINICAL DATA:  Fall.  Weakness. EXAM: PORTABLE CHEST 1 VIEW COMPARISON:  03/27/2023. FINDINGS: Bilateral lung fields are clear. Bilateral costophrenic angles are clear. Normal cardio-mediastinal silhouette. No acute osseous abnormalities. The soft tissues are within normal limits. IMPRESSION: No active disease.  Electronically Signed   By: Jules Schick M.D.   On: 07/26/2023 13:50   Microbiology: Results for orders placed or performed during the hospital encounter of 07/26/23  Urine Culture     Status: Abnormal   Collection Time: 07/26/23  1:16 PM   Specimen: Urine, Clean Catch  Result Value Ref Range Status   Specimen Description   Final    URINE, CLEAN CATCH Performed at [facility], [address], [place], [place] [address]    Special Requests   Final  NONE Performed at [facility], [address] [place], [place] [address]    Culture MULTIPLE SPECIES PRESENT, SUGGEST RECOLLECTION (A)  Final   Report Status 07/27/2023 FINAL  Final  Blood culture (routine x 2)     Status: None (Preliminary result)   Collection Time: 07/26/23  4:26 PM   Specimen: Right Antecubital; Blood  Result Value Ref Range Status   Specimen Description   Final    RIGHT ANTECUBITAL BOTTLES DRAWN AEROBIC AND ANAEROBIC   Special Requests   Final    Blood Culture results may not be optimal due to an inadequate volume of blood received in culture bottles   Culture   Final    NO GROWTH 2 DAYS Performed at [facility], [address], [place], [place] [address]    Report Status PENDING  Incomplete  Blood culture (routine x 2)     Status: None (Preliminary result)   Collection Time: 07/26/23  4:26 PM   Specimen: Left Antecubital; Blood  Result Value Ref Range Status   Specimen Description   Final    LEFT ANTECUBITAL BOTTLES DRAWN AEROBIC AND ANAEROBIC   Special Requests   Final    Blood Culture results may not be optimal due to an inadequate volume of blood received in culture bottles   Culture   Final    NO GROWTH 2 DAYS Performed at [facility], [address], [place], [place] [address]    Report Status PENDING  Incomplete   Labs: CBC: Recent Labs  Lab 07/26/23 1428 07/27/23 0303 07/28/23 0922  WBC 7.0 7.6 7.8  NEUTROABS 4.4  --  5.5  HGB 12.1 12.2 11.8*  HCT 37.8 37.5 34.9*  MCV 100.0 98.4 96.4   PLT 257 271 266   Basic Metabolic Panel: Recent Labs  Lab 07/26/23 1428 07/27/23 0303 07/28/23 0922  NA 135 136 133*  K 4.5 4.3 4.2  CL 102 105 99  CO2 24 25 20*  GLUCOSE 141* 112* 167*  BUN 14 11 13   CREATININE 0.55 0.48 0.51  CALCIUM 9.1 9.2 9.2  MG  --   --  1.6*  PHOS  --   --  3.0   Liver Function Tests: Recent Labs  Lab 07/28/23 0922  AST 23  ALT 14  ALKPHOS 59  BILITOT 0.9  PROT 6.8  ALBUMIN 3.7   CBG: Recent Labs  Lab 07/27/23 1121 07/27/23 1614 07/27/23 2025 07/28/23 0744 07/28/23 1220  GLUCAP 136* 136* 149* 122* 105*   Discharge time spent: greater than 30 minutes.  Signed: Aura Leeds, DO [facility] 07/28/2023

## 2023-07-29 DIAGNOSIS — R339 Retention of urine, unspecified: Secondary | ICD-10-CM | POA: Diagnosis not present

## 2023-07-29 DIAGNOSIS — R5381 Other malaise: Secondary | ICD-10-CM | POA: Diagnosis not present

## 2023-07-29 DIAGNOSIS — I10 Essential (primary) hypertension: Secondary | ICD-10-CM | POA: Diagnosis not present

## 2023-07-29 DIAGNOSIS — J449 Chronic obstructive pulmonary disease, unspecified: Secondary | ICD-10-CM | POA: Diagnosis not present

## 2023-07-31 LAB — CULTURE, BLOOD (ROUTINE X 2)
Culture: NO GROWTH
Culture: NO GROWTH

## 2023-08-02 DIAGNOSIS — R638 Other symptoms and signs concerning food and fluid intake: Secondary | ICD-10-CM | POA: Diagnosis not present

## 2023-08-02 DIAGNOSIS — E871 Hypo-osmolality and hyponatremia: Secondary | ICD-10-CM | POA: Diagnosis not present

## 2023-08-02 DIAGNOSIS — D649 Anemia, unspecified: Secondary | ICD-10-CM | POA: Diagnosis not present

## 2023-08-02 DIAGNOSIS — N390 Urinary tract infection, site not specified: Secondary | ICD-10-CM | POA: Diagnosis not present

## 2023-08-03 DIAGNOSIS — L988 Other specified disorders of the skin and subcutaneous tissue: Secondary | ICD-10-CM | POA: Diagnosis not present

## 2023-08-09 DIAGNOSIS — D649 Anemia, unspecified: Secondary | ICD-10-CM | POA: Diagnosis not present

## 2023-08-09 DIAGNOSIS — H6121 Impacted cerumen, right ear: Secondary | ICD-10-CM | POA: Diagnosis not present

## 2023-08-09 DIAGNOSIS — I10 Essential (primary) hypertension: Secondary | ICD-10-CM | POA: Diagnosis not present

## 2023-08-09 DIAGNOSIS — J449 Chronic obstructive pulmonary disease, unspecified: Secondary | ICD-10-CM | POA: Diagnosis not present

## 2023-08-23 DIAGNOSIS — I10 Essential (primary) hypertension: Secondary | ICD-10-CM | POA: Diagnosis not present

## 2023-08-23 DIAGNOSIS — D649 Anemia, unspecified: Secondary | ICD-10-CM | POA: Diagnosis not present

## 2023-08-23 DIAGNOSIS — R600 Localized edema: Secondary | ICD-10-CM | POA: Diagnosis not present

## 2023-08-23 DIAGNOSIS — J449 Chronic obstructive pulmonary disease, unspecified: Secondary | ICD-10-CM | POA: Diagnosis not present

## 2023-08-26 DIAGNOSIS — D649 Anemia, unspecified: Secondary | ICD-10-CM | POA: Diagnosis not present

## 2023-08-26 DIAGNOSIS — I10 Essential (primary) hypertension: Secondary | ICD-10-CM | POA: Diagnosis not present

## 2023-08-26 DIAGNOSIS — R339 Retention of urine, unspecified: Secondary | ICD-10-CM | POA: Diagnosis not present

## 2023-08-26 DIAGNOSIS — J449 Chronic obstructive pulmonary disease, unspecified: Secondary | ICD-10-CM | POA: Diagnosis not present

## 2023-08-30 DIAGNOSIS — M79605 Pain in left leg: Secondary | ICD-10-CM | POA: Diagnosis not present

## 2023-08-30 DIAGNOSIS — M79604 Pain in right leg: Secondary | ICD-10-CM | POA: Diagnosis not present

## 2023-08-30 DIAGNOSIS — R600 Localized edema: Secondary | ICD-10-CM | POA: Diagnosis not present

## 2023-08-30 DIAGNOSIS — J449 Chronic obstructive pulmonary disease, unspecified: Secondary | ICD-10-CM | POA: Diagnosis not present

## 2023-09-03 DIAGNOSIS — I739 Peripheral vascular disease, unspecified: Secondary | ICD-10-CM | POA: Diagnosis not present

## 2023-09-04 DIAGNOSIS — D649 Anemia, unspecified: Secondary | ICD-10-CM | POA: Diagnosis not present

## 2023-09-04 DIAGNOSIS — E569 Vitamin deficiency, unspecified: Secondary | ICD-10-CM | POA: Diagnosis not present

## 2023-09-04 DIAGNOSIS — E1159 Type 2 diabetes mellitus with other circulatory complications: Secondary | ICD-10-CM | POA: Diagnosis not present

## 2023-09-06 DIAGNOSIS — G9341 Metabolic encephalopathy: Secondary | ICD-10-CM | POA: Diagnosis not present

## 2023-09-06 DIAGNOSIS — N390 Urinary tract infection, site not specified: Secondary | ICD-10-CM | POA: Diagnosis not present

## 2023-09-06 DIAGNOSIS — R278 Other lack of coordination: Secondary | ICD-10-CM | POA: Diagnosis not present

## 2023-09-06 DIAGNOSIS — R1312 Dysphagia, oropharyngeal phase: Secondary | ICD-10-CM | POA: Diagnosis not present

## 2023-09-06 DIAGNOSIS — R41841 Cognitive communication deficit: Secondary | ICD-10-CM | POA: Diagnosis not present

## 2023-09-06 DIAGNOSIS — R2681 Unsteadiness on feet: Secondary | ICD-10-CM | POA: Diagnosis not present

## 2023-09-06 DIAGNOSIS — J449 Chronic obstructive pulmonary disease, unspecified: Secondary | ICD-10-CM | POA: Diagnosis not present

## 2023-09-06 DIAGNOSIS — M6281 Muscle weakness (generalized): Secondary | ICD-10-CM | POA: Diagnosis not present

## 2023-09-07 DIAGNOSIS — G9341 Metabolic encephalopathy: Secondary | ICD-10-CM | POA: Diagnosis not present

## 2023-09-07 DIAGNOSIS — R41841 Cognitive communication deficit: Secondary | ICD-10-CM | POA: Diagnosis not present

## 2023-09-07 DIAGNOSIS — R1312 Dysphagia, oropharyngeal phase: Secondary | ICD-10-CM | POA: Diagnosis not present

## 2023-09-07 DIAGNOSIS — J449 Chronic obstructive pulmonary disease, unspecified: Secondary | ICD-10-CM | POA: Diagnosis not present

## 2023-09-07 DIAGNOSIS — N390 Urinary tract infection, site not specified: Secondary | ICD-10-CM | POA: Diagnosis not present

## 2023-09-07 DIAGNOSIS — M6281 Muscle weakness (generalized): Secondary | ICD-10-CM | POA: Diagnosis not present

## 2023-09-07 DIAGNOSIS — R2681 Unsteadiness on feet: Secondary | ICD-10-CM | POA: Diagnosis not present

## 2023-09-07 DIAGNOSIS — R278 Other lack of coordination: Secondary | ICD-10-CM | POA: Diagnosis not present

## 2023-09-08 DIAGNOSIS — M79605 Pain in left leg: Secondary | ICD-10-CM | POA: Diagnosis not present

## 2023-09-08 DIAGNOSIS — R600 Localized edema: Secondary | ICD-10-CM | POA: Diagnosis not present

## 2023-09-08 DIAGNOSIS — I739 Peripheral vascular disease, unspecified: Secondary | ICD-10-CM | POA: Diagnosis not present

## 2023-09-08 DIAGNOSIS — J069 Acute upper respiratory infection, unspecified: Secondary | ICD-10-CM | POA: Diagnosis not present

## 2023-09-09 DIAGNOSIS — E871 Hypo-osmolality and hyponatremia: Secondary | ICD-10-CM | POA: Diagnosis not present

## 2023-09-13 NOTE — Progress Notes (Signed)
 [facility]   ASSESSMENT / PLAN: [name] [name] is a 89 y.o. female with atherosclerosis of native arteries of bilateral lower extremities. I do not think this is the cause of her leg pain.    Recommend:  Abstinence from all tobacco products. Blood glucose control with goal A1c < 7%. Blood pressure control with goal blood pressure < 130/80 mmHg. Lipid reduction therapy with goal LDL-C < 55 mg/dL. Aspirin  81mg  by mouth daily. Atorvastatin 40-80mg  PO QD (or other "high intensity" statin therapy).  Follow up with me as needed.  CHIEF COMPLAINT: leg pain  HISTORY OF PRESENT ILLNESS: [name] [name] is a 89 y.o. female Referred to clinic for evaluation of bilateral lower extremity pain.  The patient reports radiating discomfort from the back through the legs.  The patient is minimally ambulatory.  She does not walk fast or far enough to claudicate.  Her symptoms are not typical of rest pain.  She does not have any ulcers about her feet.  Past Medical History:  Diagnosis Date   Anxiety    Arthritis    Atrophic vaginitis    Cataract    COPD (chronic obstructive pulmonary disease) (HCC)    Depressive disorder, not elsewhere classified    Diabetes mellitus without complication (HCC)    Disorder of bone and cartilage, unspecified    Elevated blood sugar    Emphysema lung (HCC)    Hypertension    Other and unspecified hyperlipidemia    Postmenopausal    Rhinitis     Past Surgical History:  Procedure Laterality Date   APPENDECTOMY  1957   CATARACT EXTRACTION W/ INTRAOCULAR LENS IMPLANT Bilateral 2005   [facility]   COLONOSCOPY N/A 03/08/2014   Procedure: COLONOSCOPY;  Surgeon: Ruby Corporal, MD;  Location: [facility];  Service: Endoscopy;  Laterality: N/A;   EYE SURGERY     JOINT REPLACEMENT     TOTAL HIP ARTHROPLASTY Left 12/27/2013   Procedure: LEFT TOTAL HIP ARTHROPLASTY;  Surgeon: Timothy Ford, MD;  Location: [facility];  Service: Orthopedics;   Laterality: Left;    Family History  Problem Relation Age of Onset   Arthritis Mother    COPD Father    Diabetes Father    Aneurysm Sister    Diabetes Sister    Diabetes Sister    Hypertension Sister    COPD Daughter    Fibromyalgia Daughter    Healthy Daughter     Social History   Socioeconomic History   Marital status: Widowed    Spouse name: Not on file   Number of children: 2   Years of education: 10   Highest education level: 10th grade  Occupational History   Occupation: retired    Comment: Neurosurgeon and worked at [employer]  Tobacco Use   Smoking status: Never   Smokeless tobacco: Never  Vaping Use   Vaping status: Never Used  Substance and Sexual Activity   Alcohol use: No    Alcohol/week: 0.0 standard drinks of alcohol   Drug use: No   Sexual activity: Not Currently  Other Topics Concern   Not on file  Social History Narrative   Not on file   Social Drivers of Health   Financial Resource Strain: Low Risk  (12/08/2017)   Overall Financial Resource Strain (CARDIA)    Difficulty of Paying Living Expenses: Not hard at all  Food Insecurity: No Food Insecurity (07/26/2023)   Hunger Vital Sign    Worried About Running  Out of Food in the Last Year: Never true    Ran Out of Food in the Last Year: Never true  Transportation Needs: No Transportation Needs (07/26/2023)   PRAPARE - Administrator, Civil Service (Medical): No    Lack of Transportation (Non-Medical): No  Physical Activity: Inactive (12/12/2018)   Exercise Vital Sign    Days of Exercise per Week: 0 days    Minutes of Exercise per Session: 0 min  Stress: No Stress Concern Present (12/08/2017)   [employer] of Occupational Health - Occupational Stress Questionnaire    Feeling of Stress : Only a little  Social Connections: Moderately Integrated (07/26/2023)   Social Connection and Isolation Panel [NHANES]    Frequency of Communication with Friends and Family: More than three times a week     Frequency of Social Gatherings with Friends and Family: More than three times a week    Attends Religious Services: More than 4 times per year    Active Member of [employer] or Organizations: Yes    Attends Banker Meetings: More than 4 times per year    Marital Status: Widowed  Intimate Partner Violence: Patient Unable To Answer (07/26/2023)   Humiliation, Afraid, Rape, and Kick questionnaire    Fear of Current or Ex-Partner: Patient unable to answer    Emotionally Abused: Patient unable to answer    Physically Abused: Patient unable to answer    Sexually Abused: Patient unable to answer    Allergies  Allergen Reactions   Lisinopril  Cough and Other (See Comments)   Chlordiazepoxide-Clidinium     Can't remember   Clarithromycin     Can't remember   Penicillins     Can't remember   Povidone-Iodine Other (See Comments)   Raloxifene Hydrochloride Other (See Comments)   Sertraline Hcl Other (See Comments)   Sulfacetamide Sodium Nausea And Vomiting   Sulfamethoxazole-Trimethoprim    Betadine [Povidone Iodine] Rash   Erythromycin Itching and Other (See Comments)    Can't remember   Hydrogen Peroxide Rash and Other (See Comments)   Sertraline Hcl Other (See Comments)    Sweating and mouth irritation    Sulfa Antibiotics Nausea And Vomiting and Other (See Comments)    Current Outpatient Medications  Medication Sig Dispense Refill   acetaminophen  (TYLENOL ) 325 MG tablet Take 2 tablets (650 mg total) by mouth every 6 (six) hours as needed for mild pain (pain score 1-3) (or Fever >/= 101).     ALPRAZolam  (XANAX ) 0.25 MG tablet Take 1 tablet (0.25 mg total) by mouth 2 (two) times daily as needed for anxiety. 4 tablet 0   amLODipine  (NORVASC ) 2.5 MG tablet Take 1 tablet (2.5 mg total) by mouth daily. 90 tablet 0   ascorbic acid  (VITAMIN C ) 500 MG tablet Take 1 tablet (500 mg total) by mouth 2 (two) times daily.     atenolol  (TENORMIN ) 25 MG tablet Take 25 mg by mouth 2 (two)  times daily.     blood glucose meter kit and supplies KIT Check BS once daily 1 each 0   Blood Glucose Monitoring Suppl (ONETOUCH VERIO FLEX SYSTEM) w/Device KIT CHECK GLUCOSE TWICE DAILY AS NEEDED Dx E11.9 1 kit 0   budesonide -formoterol  (SYMBICORT ) 160-4.5 MCG/ACT inhaler Inhale 2 puffs into the lungs 2 (two) times daily. 1 Inhaler 10   calcium carbonate (SUPER CALCIUM) 1500 (600 Ca) MG TABS tablet Take 600 mg of elemental calcium by mouth daily with breakfast.     cholecalciferol (VITAMIN  D) 1000 UNITS tablet Take 2,000 Units by mouth daily.     cloNIDine  (CATAPRES ) 0.1 MG tablet Take 1 tablet (0.1 mg total) by mouth at bedtime as needed. 30 tablet 0   diclofenac  Sodium (VOLTAREN ) 1 % GEL Apply 2 g topically 4 (four) times daily.     escitalopram  (LEXAPRO ) 20 MG tablet Take 1 tablet (20 mg total) by mouth daily. as directed 90 tablet 0   feeding supplement (ENSURE ENLIVE / ENSURE PLUS) LIQD Take 237 mLs by mouth 2 (two) times daily between meals. 237 mL 12   fluticasone  (FLONASE ) 50 MCG/ACT nasal spray USE ONE SPRAY in each nostril ONCE daily (Patient taking differently: Place 1 spray into both nostrils daily. USE ONE SPRAY in each nostril ONCE daily) 16 g 0   gabapentin  (NEURONTIN ) 100 MG capsule Take 1 capsule (100 mg total) by mouth at bedtime. 30 capsule 1   glucose blood (ONETOUCH VERIO) test strip CHECK GLUCOSE TWICE DAILY AS NEEDED Dx E11.9 200 each 3   ketoconazole  (NIZORAL ) 2 % shampoo Apply 1 application topically 2 (two) times a week. 120 mL 0   Lancet Devices (ONE TOUCH DELICA LANCING DEV) MISC Use to check BG up to twice a day. DX: E11.29 - type 2 DM 1 each 0   Lancets (ONETOUCH ULTRASOFT) lancets CHECK GLUCOSE TWICE DAILY AS NEEDED Dx E11.9 200 each 3   losartan  (COZAAR ) 25 MG tablet Take 25 mg by mouth daily.     lovastatin  (MEVACOR ) 40 MG tablet Take 1 tablet (40 mg total) by mouth daily. 90 tablet 0   Melatonin 5 MG CAPS Take 1 capsule (5 mg total) by mouth at bedtime. 90  capsule 0   metFORMIN  (GLUCOPHAGE -XR) 500 MG 24 hr tablet 1 tablet once daily (Patient taking differently: Take 500 mg by mouth daily with breakfast. 1 tablet once daily) 90 tablet 0   Multiple Vitamin (MULTIVITAMIN WITH MINERALS) TABS tablet Take 1 tablet by mouth daily.     neomycin-bacitracin -polymyxin (NEOSPORIN) [id number] ointment Apply 1 Application topically daily as needed (infection).     ondansetron  (ZOFRAN ) 4 MG tablet Take 1 tablet (4 mg total) by mouth every 6 (six) hours as needed for nausea. 20 tablet 0   polyethylene glycol (MIRALAX  / GLYCOLAX ) 17 g packet Take 17 g by mouth daily as needed for mild constipation. 14 each 0   tamsulosin  (FLOMAX ) 0.4 MG CAPS capsule Take 0.4 mg by mouth daily.     No current facility-administered medications for this visit.    PHYSICAL EXAM Vitals:   09/14/23 1110  BP: (!) 96/46  Pulse: (!) 56  Temp: 98.3 F (36.8 C)  TempSrc: Temporal  SpO2: 91%  Weight: 129 lb (58.5 kg)  Height: 5\' 2"  (1.575 m)    Elderly woman.  Appears chronically ill.  In a wheelchair. Regular rate and rhythm Unlabored breathing Nearly triphasic Doppler flow in the bilateral dorsalis pedis arteries    PERTINENT LABORATORY AND RADIOLOGIC DATA  Most recent CBC    Latest Ref Rng & Units 07/28/2023    9:22 AM 07/27/2023    3:03 AM 07/26/2023    2:28 PM  CBC  WBC 4.0 - 10.5 K/uL 7.8  7.6  7.0   Hemoglobin 12.0 - 15.0 g/dL 21.3  08.6  57.8   Hematocrit 36.0 - 46.0 % 34.9  37.5  37.8   Platelets 150 - 400 K/uL 266  271  257      Most recent CMP    Latest  Ref Rng & Units 07/28/2023    9:22 AM 07/27/2023    3:03 AM 07/26/2023    2:28 PM  CMP  Glucose 70 - 99 mg/dL 562  130  865   BUN 8 - 23 mg/dL 13  11  14    Creatinine 0.44 - 1.00 mg/dL 7.84  6.96  2.95   Sodium 135 - 145 mmol/L 133  136  135   Potassium 3.5 - 5.1 mmol/L 4.2  4.3  4.5   Chloride 98 - 111 mmol/L 99  105  102   CO2 22 - 32 mmol/L 20  25  24    Calcium 8.9 - 10.3 mg/dL 9.2  9.2  9.1    Total Protein 6.5 - 8.1 g/dL 6.8     Total Bilirubin 0.0 - 1.2 mg/dL 0.9     Alkaline Phos 38 - 126 U/L 59     AST 15 - 41 U/L 23     ALT 0 - 44 U/L 14       Renal function CrCl cannot be calculated (Patient's most recent lab result is older than the maximum 21 days allowed.).  HB A1C (BAYER DCA - WAIVED) (%)  Date Value  02/03/2019 6.3   Hgb A1c MFr Bld (%)  Date Value  01/12/2023 6.4 (H)    LDL (calc)  Date Value Ref Range Status  08/09/2012 64 <100 mg/dL Final    Comment:    LDL-C is inaccurate if patient is nonfasting.   Reference Range: ---------------- Optimal:            <100 Near/Above Optimal: 100-129 Borderline High:    130-159 High:               160-189 Very High:          >=190       LDLC SERPL CALC-[facility]  Date Value Ref Range Status  12/04/2013 81 0 - 99 mg/dL Final    Comment:                              Optimal               <  100                           Above optimal     100 -  129                           Borderline        130 -  159                           High              160 -  189                           Very high             >  189 LDL-C is inaccurate if patient is non-fasting.   LDL Chol Calc (NIH)  Date Value Ref Range Status  02/03/2019 61 0 - 99 mg/dL Final   LDL-C  Date Value Ref Range Status  12/18/2015 65 0 - 99 mg/dL Final    Comment:  Optimal               <  100                           Above optimal     100 -  129                           Borderline        130 -  159                           High              160 -  189                           Very high             >  189 LDL-C is inaccurate if patient is non-fasting.     Outside arterial duplex report reviewed.  There is no clear blockages identified.  Tibial disease possible based on waveform analysis  Heber Little. Edgardo Goodwill, MD FACS [facility] Phone Number: [phone] 09/14/2023 1:20  PM   Total time spent on preparing this encounter including chart review, data review, collecting history, examining the patient, and coordinating care: 45 minutes  Portions of this report may have been transcribed using voice recognition software.  Every effort has been made to ensure accuracy; however, inadvertent computerized transcription errors may still be present.

## 2023-09-14 ENCOUNTER — Ambulatory Visit: Attending: Vascular Surgery | Admitting: Vascular Surgery

## 2023-09-14 VITALS — BP 96/46 | HR 56 | Temp 98.3°F | Ht 62.0 in | Wt 129.0 lb

## 2023-09-14 DIAGNOSIS — R278 Other lack of coordination: Secondary | ICD-10-CM | POA: Diagnosis not present

## 2023-09-14 DIAGNOSIS — M6281 Muscle weakness (generalized): Secondary | ICD-10-CM | POA: Diagnosis not present

## 2023-09-14 DIAGNOSIS — I739 Peripheral vascular disease, unspecified: Secondary | ICD-10-CM | POA: Diagnosis not present

## 2023-09-14 DIAGNOSIS — R41841 Cognitive communication deficit: Secondary | ICD-10-CM | POA: Diagnosis not present

## 2023-09-14 DIAGNOSIS — G9341 Metabolic encephalopathy: Secondary | ICD-10-CM | POA: Diagnosis not present

## 2023-09-14 DIAGNOSIS — I10 Essential (primary) hypertension: Secondary | ICD-10-CM | POA: Diagnosis not present

## 2023-09-14 DIAGNOSIS — N390 Urinary tract infection, site not specified: Secondary | ICD-10-CM | POA: Diagnosis not present

## 2023-09-14 DIAGNOSIS — R1312 Dysphagia, oropharyngeal phase: Secondary | ICD-10-CM | POA: Diagnosis not present

## 2023-09-14 DIAGNOSIS — R2681 Unsteadiness on feet: Secondary | ICD-10-CM | POA: Diagnosis not present

## 2023-09-15 DIAGNOSIS — N390 Urinary tract infection, site not specified: Secondary | ICD-10-CM | POA: Diagnosis not present

## 2023-09-15 DIAGNOSIS — R278 Other lack of coordination: Secondary | ICD-10-CM | POA: Diagnosis not present

## 2023-09-15 DIAGNOSIS — R2681 Unsteadiness on feet: Secondary | ICD-10-CM | POA: Diagnosis not present

## 2023-09-15 DIAGNOSIS — M6281 Muscle weakness (generalized): Secondary | ICD-10-CM | POA: Diagnosis not present

## 2023-09-15 DIAGNOSIS — G9341 Metabolic encephalopathy: Secondary | ICD-10-CM | POA: Diagnosis not present

## 2023-09-15 DIAGNOSIS — R41841 Cognitive communication deficit: Secondary | ICD-10-CM | POA: Diagnosis not present

## 2023-09-15 DIAGNOSIS — R1312 Dysphagia, oropharyngeal phase: Secondary | ICD-10-CM | POA: Diagnosis not present

## 2023-09-16 DIAGNOSIS — R41841 Cognitive communication deficit: Secondary | ICD-10-CM | POA: Diagnosis not present

## 2023-09-16 DIAGNOSIS — N390 Urinary tract infection, site not specified: Secondary | ICD-10-CM | POA: Diagnosis not present

## 2023-09-16 DIAGNOSIS — E871 Hypo-osmolality and hyponatremia: Secondary | ICD-10-CM | POA: Diagnosis not present

## 2023-09-16 DIAGNOSIS — G9341 Metabolic encephalopathy: Secondary | ICD-10-CM | POA: Diagnosis not present

## 2023-09-16 DIAGNOSIS — R2681 Unsteadiness on feet: Secondary | ICD-10-CM | POA: Diagnosis not present

## 2023-09-16 DIAGNOSIS — M6281 Muscle weakness (generalized): Secondary | ICD-10-CM | POA: Diagnosis not present

## 2023-09-16 DIAGNOSIS — R1312 Dysphagia, oropharyngeal phase: Secondary | ICD-10-CM | POA: Diagnosis not present

## 2023-09-16 DIAGNOSIS — R278 Other lack of coordination: Secondary | ICD-10-CM | POA: Diagnosis not present

## 2023-09-17 DIAGNOSIS — E860 Dehydration: Secondary | ICD-10-CM | POA: Diagnosis not present

## 2023-09-17 DIAGNOSIS — M6281 Muscle weakness (generalized): Secondary | ICD-10-CM | POA: Diagnosis not present

## 2023-09-17 DIAGNOSIS — G9341 Metabolic encephalopathy: Secondary | ICD-10-CM | POA: Diagnosis not present

## 2023-09-17 DIAGNOSIS — N390 Urinary tract infection, site not specified: Secondary | ICD-10-CM | POA: Diagnosis not present

## 2023-09-17 DIAGNOSIS — R278 Other lack of coordination: Secondary | ICD-10-CM | POA: Diagnosis not present

## 2023-09-17 DIAGNOSIS — R41841 Cognitive communication deficit: Secondary | ICD-10-CM | POA: Diagnosis not present

## 2023-09-17 DIAGNOSIS — R1312 Dysphagia, oropharyngeal phase: Secondary | ICD-10-CM | POA: Diagnosis not present

## 2023-09-17 DIAGNOSIS — R2681 Unsteadiness on feet: Secondary | ICD-10-CM | POA: Diagnosis not present

## 2023-09-20 DIAGNOSIS — R41841 Cognitive communication deficit: Secondary | ICD-10-CM | POA: Diagnosis not present

## 2023-09-20 DIAGNOSIS — I10 Essential (primary) hypertension: Secondary | ICD-10-CM | POA: Diagnosis not present

## 2023-09-20 DIAGNOSIS — G9341 Metabolic encephalopathy: Secondary | ICD-10-CM | POA: Diagnosis not present

## 2023-09-20 DIAGNOSIS — R278 Other lack of coordination: Secondary | ICD-10-CM | POA: Diagnosis not present

## 2023-09-20 DIAGNOSIS — I739 Peripheral vascular disease, unspecified: Secondary | ICD-10-CM | POA: Diagnosis not present

## 2023-09-20 DIAGNOSIS — R2681 Unsteadiness on feet: Secondary | ICD-10-CM | POA: Diagnosis not present

## 2023-09-20 DIAGNOSIS — G629 Polyneuropathy, unspecified: Secondary | ICD-10-CM | POA: Diagnosis not present

## 2023-09-20 DIAGNOSIS — M6281 Muscle weakness (generalized): Secondary | ICD-10-CM | POA: Diagnosis not present

## 2023-09-20 DIAGNOSIS — E871 Hypo-osmolality and hyponatremia: Secondary | ICD-10-CM | POA: Diagnosis not present

## 2023-09-20 DIAGNOSIS — R1312 Dysphagia, oropharyngeal phase: Secondary | ICD-10-CM | POA: Diagnosis not present

## 2023-09-20 DIAGNOSIS — N390 Urinary tract infection, site not specified: Secondary | ICD-10-CM | POA: Diagnosis not present

## 2023-09-22 DIAGNOSIS — G9341 Metabolic encephalopathy: Secondary | ICD-10-CM | POA: Diagnosis not present

## 2023-09-22 DIAGNOSIS — R1312 Dysphagia, oropharyngeal phase: Secondary | ICD-10-CM | POA: Diagnosis not present

## 2023-09-22 DIAGNOSIS — R278 Other lack of coordination: Secondary | ICD-10-CM | POA: Diagnosis not present

## 2023-09-22 DIAGNOSIS — M6281 Muscle weakness (generalized): Secondary | ICD-10-CM | POA: Diagnosis not present

## 2023-09-22 DIAGNOSIS — E871 Hypo-osmolality and hyponatremia: Secondary | ICD-10-CM | POA: Diagnosis not present

## 2023-09-22 DIAGNOSIS — I10 Essential (primary) hypertension: Secondary | ICD-10-CM | POA: Diagnosis not present

## 2023-09-22 DIAGNOSIS — R2681 Unsteadiness on feet: Secondary | ICD-10-CM | POA: Diagnosis not present

## 2023-09-22 DIAGNOSIS — R600 Localized edema: Secondary | ICD-10-CM | POA: Diagnosis not present

## 2023-09-22 DIAGNOSIS — R41841 Cognitive communication deficit: Secondary | ICD-10-CM | POA: Diagnosis not present

## 2023-09-22 DIAGNOSIS — N390 Urinary tract infection, site not specified: Secondary | ICD-10-CM | POA: Diagnosis not present

## 2023-09-22 DIAGNOSIS — I739 Peripheral vascular disease, unspecified: Secondary | ICD-10-CM | POA: Diagnosis not present

## 2023-09-23 DIAGNOSIS — N390 Urinary tract infection, site not specified: Secondary | ICD-10-CM | POA: Diagnosis not present

## 2023-09-23 DIAGNOSIS — M6281 Muscle weakness (generalized): Secondary | ICD-10-CM | POA: Diagnosis not present

## 2023-09-23 DIAGNOSIS — G9341 Metabolic encephalopathy: Secondary | ICD-10-CM | POA: Diagnosis not present

## 2023-09-23 DIAGNOSIS — R1312 Dysphagia, oropharyngeal phase: Secondary | ICD-10-CM | POA: Diagnosis not present

## 2023-09-23 DIAGNOSIS — R2681 Unsteadiness on feet: Secondary | ICD-10-CM | POA: Diagnosis not present

## 2023-09-23 DIAGNOSIS — R41841 Cognitive communication deficit: Secondary | ICD-10-CM | POA: Diagnosis not present

## 2023-09-23 DIAGNOSIS — R278 Other lack of coordination: Secondary | ICD-10-CM | POA: Diagnosis not present

## 2023-09-24 DIAGNOSIS — E871 Hypo-osmolality and hyponatremia: Secondary | ICD-10-CM | POA: Diagnosis not present

## 2023-09-25 ENCOUNTER — Emergency Department (HOSPITAL_COMMUNITY)

## 2023-09-25 ENCOUNTER — Inpatient Hospital Stay (HOSPITAL_COMMUNITY)
Admission: EM | Admit: 2023-09-25 | Discharge: 2023-10-01 | DRG: 177 | Disposition: A | Discharge status: Skilled Nursing Facility | Attending: Internal Medicine | Admitting: Internal Medicine

## 2023-09-25 ENCOUNTER — Other Ambulatory Visit: Payer: Self-pay

## 2023-09-25 DIAGNOSIS — Z555 Less than a high school diploma: Secondary | ICD-10-CM

## 2023-09-25 DIAGNOSIS — Z7189 Other specified counseling: Secondary | ICD-10-CM | POA: Diagnosis not present

## 2023-09-25 DIAGNOSIS — Z794 Long term (current) use of insulin: Secondary | ICD-10-CM

## 2023-09-25 DIAGNOSIS — J690 Pneumonitis due to inhalation of food and vomit: Secondary | ICD-10-CM | POA: Diagnosis not present

## 2023-09-25 DIAGNOSIS — J441 Chronic obstructive pulmonary disease with (acute) exacerbation: Secondary | ICD-10-CM | POA: Diagnosis not present

## 2023-09-25 DIAGNOSIS — R042 Hemoptysis: Secondary | ICD-10-CM | POA: Diagnosis present

## 2023-09-25 DIAGNOSIS — A419 Sepsis, unspecified organism: Secondary | ICD-10-CM | POA: Diagnosis present

## 2023-09-25 DIAGNOSIS — N390 Urinary tract infection, site not specified: Secondary | ICD-10-CM | POA: Diagnosis not present

## 2023-09-25 DIAGNOSIS — Z79899 Other long term (current) drug therapy: Secondary | ICD-10-CM

## 2023-09-25 DIAGNOSIS — Z66 Do not resuscitate: Secondary | ICD-10-CM | POA: Diagnosis present

## 2023-09-25 DIAGNOSIS — E8809 Other disorders of plasma-protein metabolism, not elsewhere classified: Secondary | ICD-10-CM | POA: Diagnosis present

## 2023-09-25 DIAGNOSIS — H9190 Unspecified hearing loss, unspecified ear: Secondary | ICD-10-CM | POA: Diagnosis present

## 2023-09-25 DIAGNOSIS — I160 Hypertensive urgency: Secondary | ICD-10-CM | POA: Diagnosis not present

## 2023-09-25 DIAGNOSIS — Z6826 Body mass index (BMI) 26.0-26.9, adult: Secondary | ICD-10-CM

## 2023-09-25 DIAGNOSIS — J439 Emphysema, unspecified: Secondary | ICD-10-CM | POA: Diagnosis present

## 2023-09-25 DIAGNOSIS — R062 Wheezing: Secondary | ICD-10-CM | POA: Diagnosis not present

## 2023-09-25 DIAGNOSIS — Z87440 Personal history of urinary (tract) infections: Secondary | ICD-10-CM

## 2023-09-25 DIAGNOSIS — F419 Anxiety disorder, unspecified: Secondary | ICD-10-CM | POA: Diagnosis not present

## 2023-09-25 DIAGNOSIS — Z880 Allergy status to penicillin: Secondary | ICD-10-CM

## 2023-09-25 DIAGNOSIS — Z9841 Cataract extraction status, right eye: Secondary | ICD-10-CM

## 2023-09-25 DIAGNOSIS — Z1152 Encounter for screening for COVID-19: Secondary | ICD-10-CM

## 2023-09-25 DIAGNOSIS — R4589 Other symptoms and signs involving emotional state: Secondary | ICD-10-CM

## 2023-09-25 DIAGNOSIS — J90 Pleural effusion, not elsewhere classified: Secondary | ICD-10-CM | POA: Diagnosis not present

## 2023-09-25 DIAGNOSIS — Z883 Allergy status to other anti-infective agents status: Secondary | ICD-10-CM

## 2023-09-25 DIAGNOSIS — E872 Acidosis, unspecified: Secondary | ICD-10-CM | POA: Diagnosis present

## 2023-09-25 DIAGNOSIS — J440 Chronic obstructive pulmonary disease with acute lower respiratory infection: Secondary | ICD-10-CM | POA: Diagnosis present

## 2023-09-25 DIAGNOSIS — J9811 Atelectasis: Secondary | ICD-10-CM | POA: Diagnosis not present

## 2023-09-25 DIAGNOSIS — Z8249 Family history of ischemic heart disease and other diseases of the circulatory system: Secondary | ICD-10-CM

## 2023-09-25 DIAGNOSIS — Z7984 Long term (current) use of oral hypoglycemic drugs: Secondary | ICD-10-CM

## 2023-09-25 DIAGNOSIS — D539 Nutritional anemia, unspecified: Secondary | ICD-10-CM | POA: Diagnosis present

## 2023-09-25 DIAGNOSIS — F32A Depression, unspecified: Secondary | ICD-10-CM | POA: Diagnosis present

## 2023-09-25 DIAGNOSIS — J9601 Acute respiratory failure with hypoxia: Secondary | ICD-10-CM | POA: Diagnosis not present

## 2023-09-25 DIAGNOSIS — R918 Other nonspecific abnormal finding of lung field: Secondary | ICD-10-CM | POA: Diagnosis not present

## 2023-09-25 DIAGNOSIS — R2681 Unsteadiness on feet: Secondary | ICD-10-CM | POA: Diagnosis not present

## 2023-09-25 DIAGNOSIS — E119 Type 2 diabetes mellitus without complications: Secondary | ICD-10-CM | POA: Diagnosis not present

## 2023-09-25 DIAGNOSIS — R41841 Cognitive communication deficit: Secondary | ICD-10-CM | POA: Diagnosis not present

## 2023-09-25 DIAGNOSIS — J189 Pneumonia, unspecified organism: Secondary | ICD-10-CM | POA: Diagnosis not present

## 2023-09-25 DIAGNOSIS — Z825 Family history of asthma and other chronic lower respiratory diseases: Secondary | ICD-10-CM

## 2023-09-25 DIAGNOSIS — E663 Overweight: Secondary | ICD-10-CM | POA: Diagnosis present

## 2023-09-25 DIAGNOSIS — R230 Cyanosis: Secondary | ICD-10-CM | POA: Diagnosis not present

## 2023-09-25 DIAGNOSIS — K861 Other chronic pancreatitis: Secondary | ICD-10-CM | POA: Diagnosis not present

## 2023-09-25 DIAGNOSIS — M6281 Muscle weakness (generalized): Secondary | ICD-10-CM | POA: Diagnosis not present

## 2023-09-25 DIAGNOSIS — Z515 Encounter for palliative care: Secondary | ICD-10-CM | POA: Diagnosis not present

## 2023-09-25 DIAGNOSIS — R54 Age-related physical debility: Secondary | ICD-10-CM | POA: Diagnosis not present

## 2023-09-25 DIAGNOSIS — G9341 Metabolic encephalopathy: Secondary | ICD-10-CM | POA: Diagnosis not present

## 2023-09-25 DIAGNOSIS — J811 Chronic pulmonary edema: Secondary | ICD-10-CM | POA: Diagnosis not present

## 2023-09-25 DIAGNOSIS — L89621 Pressure ulcer of left heel, stage 1: Secondary | ICD-10-CM | POA: Diagnosis present

## 2023-09-25 DIAGNOSIS — Z9842 Cataract extraction status, left eye: Secondary | ICD-10-CM

## 2023-09-25 DIAGNOSIS — R278 Other lack of coordination: Secondary | ICD-10-CM | POA: Diagnosis not present

## 2023-09-25 DIAGNOSIS — E222 Syndrome of inappropriate secretion of antidiuretic hormone: Secondary | ICD-10-CM | POA: Diagnosis not present

## 2023-09-25 DIAGNOSIS — L8990 Pressure ulcer of unspecified site, unspecified stage: Secondary | ICD-10-CM | POA: Diagnosis present

## 2023-09-25 DIAGNOSIS — Z8261 Family history of arthritis: Secondary | ICD-10-CM

## 2023-09-25 DIAGNOSIS — R0902 Hypoxemia: Secondary | ICD-10-CM | POA: Diagnosis not present

## 2023-09-25 DIAGNOSIS — E785 Hyperlipidemia, unspecified: Secondary | ICD-10-CM | POA: Diagnosis present

## 2023-09-25 DIAGNOSIS — Z888 Allergy status to other drugs, medicaments and biological substances status: Secondary | ICD-10-CM

## 2023-09-25 DIAGNOSIS — J181 Lobar pneumonia, unspecified organism: Secondary | ICD-10-CM | POA: Diagnosis not present

## 2023-09-25 DIAGNOSIS — Z833 Family history of diabetes mellitus: Secondary | ICD-10-CM

## 2023-09-25 DIAGNOSIS — E7800 Pure hypercholesterolemia, unspecified: Secondary | ICD-10-CM | POA: Diagnosis not present

## 2023-09-25 DIAGNOSIS — I10 Essential (primary) hypertension: Secondary | ICD-10-CM | POA: Diagnosis present

## 2023-09-25 DIAGNOSIS — F418 Other specified anxiety disorders: Secondary | ICD-10-CM | POA: Diagnosis present

## 2023-09-25 DIAGNOSIS — K219 Gastro-esophageal reflux disease without esophagitis: Secondary | ICD-10-CM | POA: Diagnosis present

## 2023-09-25 DIAGNOSIS — R9431 Abnormal electrocardiogram [ECG] [EKG]: Secondary | ICD-10-CM | POA: Diagnosis not present

## 2023-09-25 DIAGNOSIS — R1312 Dysphagia, oropharyngeal phase: Secondary | ICD-10-CM | POA: Diagnosis not present

## 2023-09-25 DIAGNOSIS — R0602 Shortness of breath: Secondary | ICD-10-CM | POA: Diagnosis not present

## 2023-09-25 DIAGNOSIS — Z961 Presence of intraocular lens: Secondary | ICD-10-CM | POA: Diagnosis present

## 2023-09-25 DIAGNOSIS — I2510 Atherosclerotic heart disease of native coronary artery without angina pectoris: Secondary | ICD-10-CM | POA: Diagnosis not present

## 2023-09-25 DIAGNOSIS — Z96642 Presence of left artificial hip joint: Secondary | ICD-10-CM | POA: Diagnosis present

## 2023-09-25 DIAGNOSIS — J449 Chronic obstructive pulmonary disease, unspecified: Secondary | ICD-10-CM | POA: Diagnosis not present

## 2023-09-25 LAB — BASIC METABOLIC PANEL WITH GFR
Anion gap: 9 (ref 5–15)
BUN: 18 mg/dL (ref 8–23)
CO2: 22 mmol/L (ref 22–32)
Calcium: 8.6 mg/dL — ABNORMAL LOW (ref 8.9–10.3)
Chloride: 96 mmol/L — ABNORMAL LOW (ref 98–111)
Creatinine, Ser: 0.55 mg/dL (ref 0.44–1.00)
GFR, Estimated: >60 mL/min (ref >60)
Glucose, Bld: 160 mg/dL — ABNORMAL HIGH (ref 70–99)
Potassium: 4.1 mmol/L (ref 3.5–5.1)
Sodium: 127 mmol/L — ABNORMAL LOW (ref 135–145)

## 2023-09-25 LAB — RESP PANEL BY RT-PCR (RSV, FLU A&B, COVID)  RVPGX2
Influenza A by PCR: NEGATIVE
Influenza B by PCR: NEGATIVE
Resp Syncytial Virus by PCR: NEGATIVE
SARS Coronavirus 2 by RT PCR: NEGATIVE

## 2023-09-25 LAB — TROPONIN I (HIGH SENSITIVITY)
Troponin I (High Sensitivity): 11 ng/L (ref <18)
Troponin I (High Sensitivity): 9 ng/L (ref <18)

## 2023-09-25 LAB — CBC
HCT: 34.7 % — ABNORMAL LOW (ref 36.0–46.0)
Hemoglobin: 11.2 g/dL — ABNORMAL LOW (ref 12.0–15.0)
MCH: 32 pg (ref 26.0–34.0)
MCHC: 32.3 g/dL (ref 30.0–36.0)
MCV: 99.1 fL (ref 80.0–100.0)
Platelets: 365 10*3/uL (ref 150–400)
RBC: 3.5 MIL/uL — ABNORMAL LOW (ref 3.87–5.11)
RDW: 12.2 % (ref 11.5–15.5)
WBC: 11.8 10*3/uL — ABNORMAL HIGH (ref 4.0–10.5)
nRBC: 0 % (ref 0.0–0.2)

## 2023-09-25 LAB — BRAIN NATRIURETIC PEPTIDE: B Natriuretic Peptide: 249.7 pg/mL — ABNORMAL HIGH (ref 0.0–100.0)

## 2023-09-25 LAB — LACTIC ACID, PLASMA
Lactic Acid, Venous: 2.7 mmol/L (ref 0.5–1.9)
Lactic Acid, Venous: 3.6 mmol/L (ref 0.5–1.9)
Lactic Acid, Venous: 4.4 mmol/L (ref 0.5–1.9)
Lactic Acid, Venous: 3.2 mmol/L (ref 0.5–1.9)
Lactic Acid, Venous: 2 mmol/L (ref 0.5–1.9)

## 2023-09-25 LAB — HEMOGLOBIN A1C
Hgb A1c MFr Bld: 6.3 % — ABNORMAL HIGH (ref 4.8–5.6)
Mean Plasma Glucose: 134.11 mg/dL

## 2023-09-25 LAB — GLUCOSE, CAPILLARY: Glucose-Capillary: 171 mg/dL — ABNORMAL HIGH (ref 70–99)

## 2023-09-25 LAB — MRSA NEXT GEN BY PCR, NASAL: MRSA by PCR Next Gen: NOT DETECTED

## 2023-09-25 LAB — STREP PNEUMONIAE URINARY ANTIGEN: Strep Pneumo Urinary Antigen: NEGATIVE

## 2023-09-25 MED ORDER — LACTATED RINGERS IV SOLN: INTRAVENOUS | Status: DC

## 2023-09-25 MED ORDER — DICLOFENAC SODIUM 1 % EX GEL
2.0000 g | Freq: Four times a day (QID) | CUTANEOUS | Status: DC
  Administered 2023-09-26 – 2023-10-01 (×12): 2 g via TOPICAL
  Filled 2023-09-25 (×3): qty 100

## 2023-09-25 MED ORDER — GABAPENTIN 100 MG PO CAPS
200.0000 mg | ORAL_CAPSULE | Freq: Three times a day (TID) | ORAL | Status: DC
  Administered 2023-09-26 – 2023-10-01 (×10): 200 mg via ORAL
  Filled 2023-09-25 (×18): qty 2

## 2023-09-25 MED ORDER — LACTATED RINGERS IV BOLUS: 500.0000 mL | Freq: Once | INTRAVENOUS | Status: AC

## 2023-09-25 MED ORDER — KETOROLAC TROMETHAMINE 15 MG/ML IJ SOLN: 15.0000 mg | Freq: Once | INTRAMUSCULAR | Status: DC

## 2023-09-25 MED ORDER — LOSARTAN POTASSIUM 50 MG PO TABS
25.0000 mg | ORAL_TABLET | Freq: Every day | ORAL | Status: DC
  Administered 2023-09-26 – 2023-10-01 (×3): 25 mg via ORAL
  Filled 2023-09-25 (×7): qty 1

## 2023-09-25 MED ORDER — ENOXAPARIN SODIUM 40 MG/0.4ML IJ SOSY
40.0000 mg | PREFILLED_SYRINGE | INTRAMUSCULAR | Status: DC
  Filled 2023-09-25 (×2): qty 0.4

## 2023-09-25 MED ORDER — ATENOLOL 25 MG PO TABS
25.0000 mg | ORAL_TABLET | Freq: Two times a day (BID) | ORAL | Status: DC
  Administered 2023-09-26 – 2023-10-01 (×5): 25 mg via ORAL
  Filled 2023-09-25 (×9): qty 1

## 2023-09-25 MED ORDER — IPRATROPIUM-ALBUTEROL 0.5-2.5 (3) MG/3ML IN SOLN
3.0000 mL | Freq: Once | RESPIRATORY_TRACT | Status: AC
  Filled 2023-09-25: qty 3

## 2023-09-25 MED ORDER — SODIUM CHLORIDE 0.9 % IV SOLN
500.0000 mg | Freq: Once | INTRAVENOUS | Status: AC
  Administered 2023-09-25: 500 mg via INTRAVENOUS
  Filled 2023-09-25: qty 5

## 2023-09-25 MED ORDER — ONDANSETRON HCL 4 MG/2ML IJ SOLN: 4.0000 mg | Freq: Four times a day (QID) | INTRAMUSCULAR | Status: DC | PRN

## 2023-09-25 MED ORDER — ALPRAZOLAM 0.25 MG PO TABS
0.2500 mg | ORAL_TABLET | Freq: Two times a day (BID) | ORAL | Status: DC | PRN
  Administered 2023-09-29 – 2023-10-01 (×2): 0.25 mg via ORAL
  Filled 2023-09-25 (×7): qty 1

## 2023-09-25 MED ORDER — CALCIUM CARBONATE 1250 (500 CA) MG PO TABS
500.0000 mg | ORAL_TABLET | Freq: Every day | ORAL | Status: DC
  Administered 2023-09-26 – 2023-10-01 (×4): 1250 mg via ORAL
  Filled 2023-09-25 (×6): qty 1

## 2023-09-25 MED ORDER — INSULIN ASPART 100 UNIT/ML IJ SOLN
0.0000 [IU] | Freq: Three times a day (TID) | INTRAMUSCULAR | Status: DC
  Administered 2023-09-26: 2 [IU] via SUBCUTANEOUS
  Administered 2023-09-28: 5 [IU] via SUBCUTANEOUS
  Administered 2023-09-29: 2 [IU] via SUBCUTANEOUS

## 2023-09-25 MED ORDER — ACETAMINOPHEN 650 MG RE SUPP: 650.0000 mg | Freq: Four times a day (QID) | RECTAL | Status: DC | PRN

## 2023-09-25 MED ORDER — TAMSULOSIN HCL 0.4 MG PO CAPS
0.4000 mg | ORAL_CAPSULE | Freq: Every day | ORAL | Status: DC
  Administered 2023-09-26 – 2023-10-01 (×4): 0.4 mg via ORAL
  Filled 2023-09-25 (×7): qty 1

## 2023-09-25 MED ORDER — ACETAMINOPHEN 325 MG PO TABS
650.0000 mg | ORAL_TABLET | Freq: Four times a day (QID) | ORAL | Status: DC | PRN
  Administered 2023-09-25 – 2023-10-01 (×2): 650 mg via ORAL
  Filled 2023-09-25 (×3): qty 2

## 2023-09-25 MED ORDER — VITAMIN D 25 MCG (1000 UNIT) PO TABS
2000.0000 [IU] | ORAL_TABLET | Freq: Every day | ORAL | Status: DC
  Administered 2023-09-26 – 2023-10-01 (×4): 2000 [IU] via ORAL
  Filled 2023-09-25 (×7): qty 2

## 2023-09-25 MED ORDER — ONDANSETRON HCL 4 MG PO TABS: 4.0000 mg | ORAL_TABLET | Freq: Four times a day (QID) | ORAL | Status: DC | PRN

## 2023-09-25 MED ORDER — SODIUM CHLORIDE 0.9 % IV SOLN
1.0000 g | Freq: Once | INTRAVENOUS | Status: AC
  Filled 2023-09-25: qty 10

## 2023-09-25 MED ORDER — MELATONIN 5 MG PO TABS
5.0000 mg | ORAL_TABLET | Freq: Every day | ORAL | Status: DC
  Administered 2023-09-26 – 2023-09-29 (×3): 5 mg via ORAL
  Filled 2023-09-25 (×6): qty 1

## 2023-09-25 MED ORDER — HYDRALAZINE HCL 20 MG/ML IJ SOLN
10.0000 mg | INTRAMUSCULAR | Status: DC | PRN
  Filled 2023-09-25 (×2): qty 1

## 2023-09-25 MED ORDER — IOHEXOL 350 MG/ML SOLN: 75.0000 mL | Freq: Once | INTRAVENOUS | Status: AC | PRN

## 2023-09-25 MED ORDER — SODIUM CHLORIDE 0.9 % IV SOLN
500.0000 mg | INTRAVENOUS | Status: DC
  Administered 2023-09-26: 500 mg via INTRAVENOUS
  Filled 2023-09-25 (×2): qty 5

## 2023-09-25 MED ORDER — KETOROLAC TROMETHAMINE 15 MG/ML IJ SOLN
7.5000 mg | Freq: Once | INTRAMUSCULAR | Status: AC
  Filled 2023-09-25: qty 1

## 2023-09-25 MED ORDER — SODIUM CHLORIDE 0.9 % IV SOLN
2.0000 g | INTRAVENOUS | Status: AC
  Administered 2023-09-26 – 2023-09-29 (×3): 2 g via INTRAVENOUS
  Filled 2023-09-25 (×4): qty 20

## 2023-09-25 MED ORDER — LACTATED RINGERS IV BOLUS: 1000.0000 mL | Freq: Once | INTRAVENOUS | Status: DC

## 2023-09-25 MED ORDER — POLYETHYLENE GLYCOL 3350 17 G PO PACK: 17.0000 g | PACK | Freq: Every day | ORAL | Status: DC | PRN

## 2023-09-25 MED ORDER — CHLORHEXIDINE GLUCONATE CLOTH 2 % EX PADS
6.0000 | MEDICATED_PAD | Freq: Every day | CUTANEOUS | Status: DC
  Administered 2023-09-26 – 2023-10-01 (×4): 6 via TOPICAL

## 2023-09-25 MED ORDER — PRAVASTATIN SODIUM 40 MG PO TABS
40.0000 mg | ORAL_TABLET | Freq: Every day | ORAL | Status: DC
  Administered 2023-09-26 – 2023-09-29 (×3): 40 mg via ORAL
  Filled 2023-09-25: qty 2
  Filled 2023-09-25: qty 1
  Filled 2023-09-25 (×5): qty 2

## 2023-09-25 MED ORDER — IPRATROPIUM-ALBUTEROL 0.5-2.5 (3) MG/3ML IN SOLN
3.0000 mL | Freq: Three times a day (TID) | RESPIRATORY_TRACT | Status: DC
  Filled 2023-09-25 (×2): qty 3

## 2023-09-25 MED ORDER — ESCITALOPRAM OXALATE 20 MG PO TABS
20.0000 mg | ORAL_TABLET | Freq: Every day | ORAL | Status: DC
  Administered 2023-09-26 – 2023-10-01 (×4): 20 mg via ORAL
  Filled 2023-09-25 (×7): qty 1

## 2023-09-25 NOTE — H&P (Signed)
 History and Physical    Patient: [name] [name] EXB:[id number] DOB: Dec 13, 1931 DOA: 09/25/2023 DOS: the patient was seen and examined on 09/25/2023 PCP: Patient, No Pcp Per  Patient coming from: Home  Chief Complaint:  Chief Complaint  Patient presents with   Shortness of Breath   Hemoptysis   HPI: [name] [name] is a 89 y.o. female with medical history significant of anxiety, depression, osteoarthritis, atrophic vaginitis, cataracts, COPD, type 2 diabetes, hypertension, history of rhabdomyolysis, transaminitis, history of UTI who was sent from: [facility] with sudden onset of dyspnea and hemoptysis that woke her up around 0400.  O2 sat was in the 60s when EMS arrived.  She was placed on nasal cannula oxygen, given a DuoNeb, and albuterol  neb and 125 mg of Solu-Medrol .  Impaired hearing.  No fever, chills or sore throat.  No chest pain, palpitations, diaphoresis, PND, orthopnea or pitting edema of the lower extremities.  No abdominal pain, nausea, emesis, diarrhea, constipation, melena or hematochezia.  No flank pain, dysuria, frequency or hematuria.  Mildly disoriented.  Lab work: Coronavirus, influenza and RSV PCR was negative.  BNP 249.7 pg/mL.  CBC showed a white count of 11.8, hemoglobin 1211.2 g/dL platelets 102.  Lactic acid 2.7 then 3.6 then 4.4 mmol/L.  Troponin x 2 normal.  BMP showed a sodium 127, potassium 4.1, chloride 96 and CO2 22 mmol/L, anion gap was normal.  Glucose 160 and calcium 8.6 mg/dL.  Renal function was normal.  Imaging: 1 view chest radiograph showed no acute abnormality of the cardiomediastinal silhouette.  Mild edema, bilateral air space opacity, likely atelectasis, infection not excluded.  CTA chest was negative for PE.  Small, masslike consolidation of the infrahilar right lower lobe.  Heterogenic airspace opacity in the adjacent right lower lobe posteriorly.  Normal heart size.  Left coronary artery calcifications.  No pericardial effusions.  Aortic atherosclerosis.   Trace pleural effusions.   ED course: Initial vital signs were temperature 97.7 F, pulse 100, respiration 20, BP 166/129 mmHg and O2 sat 96% on nasal cannula 4 LPM.  The patient received an albuterol  and DuoNeb with EMS and another DuoNeb in the emergency department.  She was also given ceftriaxone  and azithromycin .  Review of Systems: As mentioned in the history of present illness. All other systems reviewed and are negative.  Past Medical History:  Diagnosis Date   Anxiety    Arthritis    Atrophic vaginitis    Cataract    COPD (chronic obstructive pulmonary disease) (HCC)    Depressive disorder, not elsewhere classified    Diabetes mellitus without complication (HCC)    Disorder of bone and cartilage, unspecified    Elevated blood sugar    Emphysema lung (HCC)    Hypertension    Other and unspecified hyperlipidemia    Postmenopausal    Rhinitis    Past Surgical History:  Procedure Laterality Date   APPENDECTOMY  1957   CATARACT EXTRACTION W/ INTRAOCULAR LENS IMPLANT Bilateral 2005   [facility]   COLONOSCOPY N/A 03/08/2014   Procedure: COLONOSCOPY;  Surgeon: Ruby Corporal, MD;  Location: [facility];  Service: Endoscopy;  Laterality: N/A;   EYE SURGERY     JOINT REPLACEMENT     TOTAL HIP ARTHROPLASTY Left 12/27/2013   Procedure: LEFT TOTAL HIP ARTHROPLASTY;  Surgeon: Timothy Ford, MD;  Location: [facility] OR;  Service: Orthopedics;  Laterality: Left;   Social History:  reports that she has never smoked. She has never used smokeless tobacco. She  reports that she does not drink alcohol and does not use drugs.  Allergies  Allergen Reactions   Lisinopril  Cough and Other (See Comments)   Chlordiazepoxide-Clidinium     Can't remember   Clarithromycin     Can't remember   Penicillins     Can't remember   Povidone-Iodine Other (See Comments)   Raloxifene Hydrochloride Other (See Comments)   Sertraline Hcl Other (See Comments)   Sulfacetamide Sodium Nausea And Vomiting    Sulfamethoxazole-Trimethoprim    Betadine [Povidone Iodine] Rash   Erythromycin Itching and Other (See Comments)    Can't remember   Hydrogen Peroxide Rash and Other (See Comments)   Sertraline Hcl Other (See Comments)    Sweating and mouth irritation    Sulfa Antibiotics Nausea And Vomiting and Other (See Comments)    Family History  Problem Relation Age of Onset   Arthritis Mother    COPD Father    Diabetes Father    Aneurysm Sister    Diabetes Sister    Diabetes Sister    Hypertension Sister    COPD Daughter    Fibromyalgia Daughter    Healthy Daughter     Prior to Admission medications   Medication Sig Start Date End Date Taking? Authorizing Provider  acetaminophen  (TYLENOL ) 325 MG tablet Take 2 tablets (650 mg total) by mouth every 6 (six) hours as needed for mild pain (pain score 1-3) (or Fever >/= 101). 07/28/23   Aura Leeds Latif, DO  ALPRAZolam  (XANAX ) 0.25 MG tablet Take 1 tablet (0.25 mg total) by mouth 2 (two) times daily as needed for anxiety. 07/28/23   Aura Leeds Latif, DO  amLODipine  (NORVASC ) 2.5 MG tablet Take 1 tablet (2.5 mg total) by mouth daily. 03/20/19   Dettinger, Lucio Sabin, MD  ascorbic acid  (VITAMIN C ) 500 MG tablet Take 1 tablet (500 mg total) by mouth 2 (two) times daily. 07/28/23   Aura Leeds Latif, DO  atenolol  (TENORMIN ) 25 MG tablet Take 25 mg by mouth 2 (two) times daily.    [provider]  blood glucose meter kit and supplies KIT Check BS once daily 12/12/15   Joline Ned, MD  Blood Glucose Monitoring Suppl (ONETOUCH VERIO FLEX SYSTEM) w/Device KIT CHECK GLUCOSE TWICE DAILY AS NEEDED Dx E11.9 03/13/19   Galvin Jules, FNP  budesonide -formoterol  (SYMBICORT ) 160-4.5 MCG/ACT inhaler Inhale 2 puffs into the lungs 2 (two) times daily. 10/05/17   Dettinger, Lucio Sabin, MD  calcium carbonate (SUPER CALCIUM) 1500 (600 Ca) MG TABS tablet Take 600 mg of elemental calcium by mouth daily with breakfast. 03/11/15   [provider]   cholecalciferol (VITAMIN D ) 1000 UNITS tablet Take 2,000 Units by mouth daily.    [provider]  cloNIDine  (CATAPRES ) 0.1 MG tablet Take 1 tablet (0.1 mg total) by mouth at bedtime as needed. 05/19/19   Galvin Jules, FNP  diclofenac  Sodium (VOLTAREN ) 1 % GEL Apply 2 g topically 4 (four) times daily. 07/28/23   Aura Leeds Latif, DO  escitalopram  (LEXAPRO ) 20 MG tablet Take 1 tablet (20 mg total) by mouth daily. as directed 03/20/19   Dettinger, Lucio Sabin, MD  feeding supplement (ENSURE ENLIVE / ENSURE PLUS) LIQD Take 237 mLs by mouth 2 (two) times daily between meals. 07/28/23   Sheikh, Omair Latif, DO  fluticasone  (FLONASE ) 50 MCG/ACT nasal spray USE ONE SPRAY in each nostril ONCE daily Patient taking differently: Place 1 spray into both nostrils daily. USE ONE SPRAY in each nostril ONCE daily  05/19/19   Galvin Jules, FNP  gabapentin  (NEURONTIN ) 100 MG capsule Take 1 capsule (100 mg total) by mouth at bedtime. 02/27/20   Persons, Norma Beckers, PA  glucose blood (ONETOUCH VERIO) test strip CHECK GLUCOSE TWICE DAILY AS NEEDED Dx E11.9 03/13/19   Galvin Jules, FNP  ketoconazole  (NIZORAL ) 2 % shampoo Apply 1 application topically 2 (two) times a week. 05/26/18   Delfina Feller, FNP  Lancet Devices (ONE TOUCH DELICA LANCING DEV) MISC Use to check BG up to twice a day. DX: E11.29 - type 2 DM 09/02/16   Joline Ned, MD  Lancets [facility] ULTRASOFT) lancets CHECK GLUCOSE TWICE DAILY AS NEEDED Dx E11.9 03/13/19   Galvin Jules, FNP  losartan  (COZAAR ) 25 MG tablet Take 25 mg by mouth daily. 12/07/22   [provider]  lovastatin  (MEVACOR ) 40 MG tablet Take 1 tablet (40 mg total) by mouth daily. 03/20/19   Dettinger, Lucio Sabin, MD  Melatonin 5 MG CAPS Take 1 capsule (5 mg total) by mouth at bedtime. 03/20/19   Dettinger, Lucio Sabin, MD  metFORMIN  (GLUCOPHAGE -XR) 500 MG 24 hr tablet 1 tablet once daily Patient taking differently: Take 500 mg by mouth daily with breakfast. 1 tablet once  daily 03/20/19   Dettinger, Joshua A, MD  Multiple Vitamin (MULTIVITAMIN WITH MINERALS) TABS tablet Take 1 tablet by mouth daily. 01/15/23   Lesa Rape, MD  neomycin-bacitracin -polymyxin (NEOSPORIN) [phone] ointment Apply 1 Application topically daily as needed (infection).    [provider]  ondansetron  (ZOFRAN ) 4 MG tablet Take 1 tablet (4 mg total) by mouth every 6 (six) hours as needed for nausea. 07/28/23   Sheikh, Omair Latif, DO  polyethylene glycol (MIRALAX  / GLYCOLAX ) 17 g packet Take 17 g by mouth daily as needed for mild constipation. 07/28/23   Aura Leeds Latif, DO  tamsulosin  (FLOMAX ) 0.4 MG CAPS capsule Take 0.4 mg by mouth daily. 03/04/23   [provider]    Physical Exam: Vitals:   09/25/23 0850 09/25/23 1000 09/25/23 1012 09/25/23 1030  BP:  129/66  (!) 144/57  Pulse: 91 92  87  Resp: 18 19  (!) 22  Temp:   98.7 F (37.1 C)   TempSrc:   Oral   SpO2: 94% 97%  96%  Weight:       Physical Exam Vitals and nursing note reviewed.  Constitutional:      General: She is awake. She is not in acute distress.    Appearance: She is ill-appearing.     Interventions: Nasal cannula in place.  HENT:     Head: Normocephalic.     Nose: No rhinorrhea.     Mouth/Throat:     Mouth: Mucous membranes are dry.   Eyes:     General: No scleral icterus.    Pupils: Pupils are equal, round, and reactive to light.   Neck:     Vascular: No JVD.   Cardiovascular:     Rate and Rhythm: Normal rate and regular rhythm.     Heart sounds: S1 normal and S2 normal.  Pulmonary:     Effort: No tachypnea.     Breath sounds: Wheezing, rhonchi and rales present.  Abdominal:     General: There is no distension.     Tenderness: There is no abdominal tenderness. There is no right CVA tenderness or left CVA tenderness.   Musculoskeletal:     Cervical back: Neck supple.     Right lower leg: No edema.  Left lower leg: No edema.   Skin:    General: Skin is warm and dry.    Neurological:     General: No focal deficit present.     Mental Status: She is alert and oriented to person, place, and time.   Psychiatric:        Mood and Affect: Mood normal.        Behavior: Behavior normal. Behavior is cooperative.    Data Reviewed:  Results are pending, will review when available.  EKG: Vent. rate 108 BPM PR interval 162 ms QRS duration 97 ms QT/QTcB 350/470 ms P-R-T axes 102 -65 72 Sinus tachycardia Left anterior fascicular block Anterior infarct, old Artifact in lead(s) I II aVR aVL aVF  Assessment and Plan: Principal Problem:   Acute respiratory failure with hypoxia (HCC) In the setting of:   CAP (community acquired pneumonia)  And:   COPD with acute exacerbation (HCC) With associated:   Sepsis (HCC) Admit to SDU/inpatient. Continue supplemental oxygen. Scheduled and as needed bronchodilators. Continue ceftriaxone  1 g IVPB daily. Continue azithromycin  500 mg IVPB daily. Check strep pneumoniae urinary antigen. Check sputum Gram stain, culture and sensitivity. Follow-up blood culture and sensitivity. Follow-up CBC and chemistry in the morning. Initially thought lactic acidosis from bronchodilators. -IVF held in the ED due to questionable pulmonary edema/elevated BNP. - However, normal heart size on CTA chest. - Given increased lactic acid, IV fluids ordered.  Active Problems:   Depression with anxiety Continue escitalopram  20 mg p.o. daily. Continue alprazolam  as needed.    Type 2 diabetes mellitus without complication,  without long-term current use of insulin  (HCC) Carbohydrate modified diet. Hold metformin  CBG monitoring with RI SS. Check hemoglobin A1c.    Pressure injury of skin Preventive measures.    Essential hypertension Hold antihypertensives for now.    Hyperlipidemia Continue lovastatin  or formulary equivalent.    Hypocalcemia Recheck calcium with albumin level in AM. Further workup depending on  results.     Advance Care Planning:   Code Status: Limited: Do not attempt resuscitation (DNR) -DNR-LIMITED -Do Not Intubate/DNI    Consults:   Family Communication:   Severity of Illness: The appropriate patient status for this patient is INPATIENT. Inpatient status is judged to be reasonable and necessary in order to provide the required intensity of service to ensure the patient's safety. The patient's presenting symptoms, physical exam findings, and initial radiographic and laboratory data in the context of their chronic comorbidities is felt to place them at high risk for further clinical deterioration. Furthermore, it is not anticipated that the patient will be medically stable for discharge from the hospital within 2 midnights of admission.   * I certify that at the point of admission it is my clinical judgment that the patient will require inpatient hospital care spanning beyond 2 midnights from the point of admission due to high intensity of service, high risk for further deterioration and high frequency of surveillance required.*  Author: Danice Dural, MD 09/25/2023 11:14 AM  For on call review [email or web address].   This document was prepared using Dragon voice recognition software and may contain some unintended transcription errors.

## 2023-09-25 NOTE — ED Notes (Signed)
 Multiple attempts made to contact floor to advise them that pt was being transported.

## 2023-09-25 NOTE — ED Notes (Signed)
 Pt A&Ox4. Introduced self to pt. Removed duoneb mask and placed back on 4L . Pt continuously spitting up dark brownish/red sputum while this RN in room. Pt states she is not in pain other then her chronic arthritis.

## 2023-09-25 NOTE — ED Provider Notes (Signed)
 MSE was initiated and I personally evaluated the patient and placed orders (if any) at  6:48 AM on September 25, 2023.  The patient appears stable so that the remainder of the MSE may be completed by another provider.  Patient with hypertension, diabetes, COPD developed shortness of breath and hemoptysis this morning.  She was noted to be very hypoxic on room air but oxygen saturation improved with supplemental oxygen.  On exam, she has minimal wheezing but bibasilar rales much more prominent on the left.  She also has asymmetric leg swelling with left calf circumference 2 cm greater than right calf circumference.  She is DNR, I have ordered initial labs and CT angiogram to rule out pulmonary embolism.   Alissa April, MD 09/25/23 [phone]

## 2023-09-25 NOTE — ED Provider Notes (Signed)
 [place] EMERGENCY DEPARTMENT AT [facility] Provider Note   CSN: [id number] Arrival date & time: 09/25/23  9390     Patient presents with: Shortness of Breath and Hemoptysis   [name] [name] is a 89 y.o. female with PMHx anxiety, OA, COPD, DM, HTN who presents to ED concerned for hemoptysis and SOB. Patient stating that she was having her brief changed this morning, when she was rolled back over she started coughing up blood. Patient unsure how long she has been feeling short of breath for. Patient unsure if she has had any chest pain recently or fevers. Patient endorsing post-tussive vomiting and does have a little bit of nausea right now. Denies diarrhea.  EMS noting that patient's O2 was in the 60's and she appeared cyanotic  [facility]- EMS provided patient with breathing treatments and 125mg  Solumedrol.   Patient was on 4L [place] when I started my interview. I turned the O2 down to 1L and patient still satting in the 90's.       Shortness of Breath      Prior to Admission medications   Medication Sig Start Date End Date Taking? Authorizing Provider  acetaminophen  (TYLENOL ) 325 MG tablet Take 2 tablets (650 mg total) by mouth every 6 (six) hours as needed for mild pain (pain score 1-3) (or Fever >/= 101). 07/28/23   Sherrill Cable Latif, DO  ALPRAZolam  (XANAX ) 0.25 MG tablet Take 1 tablet (0.25 mg total) by mouth 2 (two) times daily as needed for anxiety. 07/28/23   Sherrill Cable Latif, DO  amLODipine  (NORVASC ) 2.5 MG tablet Take 1 tablet (2.5 mg total) by mouth daily. 03/20/19   Dettinger, Fonda LABOR, MD  ascorbic acid  (VITAMIN C ) 500 MG tablet Take 1 tablet (500 mg total) by mouth 2 (two) times daily. 07/28/23   Sherrill Cable Latif, DO  atenolol  (TENORMIN ) 25 MG tablet Take 25 mg by mouth 2 (two) times daily.    [provider]  blood glucose meter kit and supplies KIT Check BS once daily 12/12/15   Georgina Nancyann ORN, MD  Blood Glucose Monitoring Suppl (ONETOUCH  VERIO FLEX SYSTEM) w/Device KIT CHECK GLUCOSE TWICE DAILY AS NEEDED Dx E11.9 03/13/19   Severa Rock HERO, FNP  budesonide -formoterol  (SYMBICORT ) 160-4.5 MCG/ACT inhaler Inhale 2 puffs into the lungs 2 (two) times daily. 10/05/17   Dettinger, Fonda LABOR, MD  calcium  carbonate (SUPER CALCIUM ) 1500 (600 Ca) MG TABS tablet Take 600 mg of elemental calcium  by mouth daily with breakfast. 03/11/15   [provider]  cholecalciferol  (VITAMIN D ) 1000 UNITS tablet Take 2,000 Units by mouth daily.    [provider]  cloNIDine  (CATAPRES ) 0.1 MG tablet Take 1 tablet (0.1 mg total) by mouth at bedtime as needed. 05/19/19   Severa Rock HERO, FNP  diclofenac  Sodium (VOLTAREN ) 1 % GEL Apply 2 g topically 4 (four) times daily. 07/28/23   Sherrill Cable Latif, DO  escitalopram  (LEXAPRO ) 20 MG tablet Take 1 tablet (20 mg total) by mouth daily. as directed 03/20/19   Dettinger, Fonda LABOR, MD  feeding supplement (ENSURE ENLIVE / ENSURE PLUS) LIQD Take 237 mLs by mouth 2 (two) times daily between meals. 07/28/23   Sheikh, Omair Latif, DO  fluticasone  (FLONASE ) 50 MCG/ACT nasal spray USE ONE SPRAY in each nostril ONCE daily Patient taking differently: Place 1 spray into both nostrils daily. USE ONE SPRAY in each nostril ONCE daily 05/19/19   Severa Rock HERO, FNP  gabapentin  (NEURONTIN ) 100 MG capsule Take  1 capsule (100 mg total) by mouth at bedtime. 02/27/20   Persons, Ronal Dragon, PA  glucose blood (ONETOUCH VERIO) test strip CHECK GLUCOSE TWICE DAILY AS NEEDED Dx E11.9 03/13/19   Severa Rock HERO, FNP  ketoconazole  (NIZORAL ) 2 % shampoo Apply 1 application topically 2 (two) times a week. 05/26/18   Gladis Mustard, FNP  Lancet Devices (ONE TOUCH DELICA LANCING DEV) MISC Use to check BG up to twice a day. DX: E11.29 - type 2 DM 09/02/16   Georgina Nancyann ORN, MD  Lancets [facility] ULTRASOFT) lancets CHECK GLUCOSE TWICE DAILY AS NEEDED Dx E11.9 03/13/19   Severa Rock HERO, FNP  losartan  (COZAAR ) 25 MG tablet Take 25 mg by mouth  daily. 12/07/22   [provider]  lovastatin  (MEVACOR ) 40 MG tablet Take 1 tablet (40 mg total) by mouth daily. 03/20/19   Dettinger, Fonda LABOR, MD  Melatonin 5 MG CAPS Take 1 capsule (5 mg total) by mouth at bedtime. 03/20/19   Dettinger, Fonda LABOR, MD  metFORMIN  (GLUCOPHAGE -XR) 500 MG 24 hr tablet 1 tablet once daily Patient taking differently: Take 500 mg by mouth daily with breakfast. 1 tablet once daily 03/20/19   Dettinger, Joshua A, MD  Multiple Vitamin (MULTIVITAMIN WITH MINERALS) TABS tablet Take 1 tablet by mouth daily. 01/15/23   Christobal Guadalajara, MD  neomycin-bacitracin -polymyxin (NEOSPORIN) [phone] ointment Apply 1 Application topically daily as needed (infection).    [provider]  ondansetron  (ZOFRAN ) 4 MG tablet Take 1 tablet (4 mg total) by mouth every 6 (six) hours as needed for nausea. 07/28/23   Sheikh, Omair Latif, DO  polyethylene glycol (MIRALAX  / GLYCOLAX ) 17 g packet Take 17 g by mouth daily as needed for mild constipation. 07/28/23   Sherrill Cable Latif, DO  tamsulosin  (FLOMAX ) 0.4 MG CAPS capsule Take 0.4 mg by mouth daily. 03/04/23   [provider]    Allergies: Lisinopril , Chlordiazepoxide-clidinium, Clarithromycin, Penicillins, Povidone-iodine, Raloxifene hydrochloride, Sertraline hcl, Sulfacetamide sodium, Sulfamethoxazole-trimethoprim, Betadine [povidone iodine], Erythromycin, Hydrogen peroxide, Sertraline hcl, and Sulfa antibiotics    Review of Systems  Respiratory:  Positive for shortness of breath.     Updated Vital Signs BP (!) 144/57   Pulse 87   Temp 98.7 F (37.1 C) (Oral)   Resp (!) 22   Wt 58.5 kg   SpO2 96%   BMI 23.59 kg/m   Physical Exam Vitals and nursing note reviewed.  Constitutional:      General: She is not in acute distress.    Appearance: She is ill-appearing. She is not toxic-appearing.  HENT:     Head: Normocephalic and atraumatic.     Mouth/Throat:     Mouth: Mucous membranes are moist.     Pharynx: No  posterior oropharyngeal erythema.   Eyes:     General: No scleral icterus.       Right eye: No discharge.        Left eye: No discharge.     Conjunctiva/sclera: Conjunctivae normal.    Cardiovascular:     Rate and Rhythm: Regular rhythm. Tachycardia present.     Pulses: Normal pulses.     Heart sounds: Normal heart sounds. No murmur heard. Pulmonary:     Effort: Pulmonary effort is normal. Tachypnea present. No respiratory distress.     Breath sounds: Wheezing present. No rhonchi or rales.     Comments: Diffuse wheezing Abdominal:     Tenderness: There is no abdominal tenderness.   Musculoskeletal:     Right lower leg: No  edema.     Left lower leg: No edema.   Skin:    General: Skin is warm and dry.     Findings: No rash.   Neurological:     General: No focal deficit present.     Mental Status: She is alert and oriented to person, place, and time. Mental status is at baseline.   Psychiatric:        Mood and Affect: Mood normal.     (all labs ordered are listed, but only abnormal results are displayed) Labs Reviewed  BASIC METABOLIC PANEL WITH GFR - Abnormal; Notable for the following components:      Result Value   Sodium 127 (*)    Chloride 96 (*)    Glucose, Bld 160 (*)    Calcium  8.6 (*)    All other components within normal limits  CBC - Abnormal; Notable for the following components:   WBC 11.8 (*)    RBC 3.50 (*)    Hemoglobin 11.2 (*)    HCT 34.7 (*)    All other components within normal limits  BRAIN NATRIURETIC PEPTIDE - Abnormal; Notable for the following components:   B Natriuretic Peptide 249.7 (*)    All other components within normal limits  LACTIC ACID, PLASMA - Abnormal; Notable for the following components:   Lactic Acid, Venous 2.7 (*)    All other components within normal limits  RESP PANEL BY RT-PCR (RSV, FLU A&B, COVID)  [id number]  CULTURE, BLOOD (ROUTINE X 2)  CULTURE, BLOOD (ROUTINE X 2)  LACTIC ACID, PLASMA  TROPONIN I (HIGH  SENSITIVITY)  TROPONIN I (HIGH SENSITIVITY)    EKG: EKG Interpretation Date/Time:  Saturday September 25 2023 06:20:04 EDT Ventricular Rate:  108 PR Interval:  162 QRS Duration:  97 QT Interval:  350 QTC Calculation: 470 R Axis:   -65  Text Interpretation: Sinus tachycardia Left anterior fascicular block Anterior infarct, old Artifact in lead(s) I II aVR aVL aVF When compared with ECG of 07/26/2023, HEART RATE has increased Confirmed by Raford Lenis ([id number]) on 09/25/2023 6:43:10 AM  Radiology: CT Angio Chest PE W and/or Wo Contrast Result Date: 09/25/2023 CLINICAL DATA:  PE suspected, shortness of breath, hemoptysis * Tracking Code: BO * EXAM: CT ANGIOGRAPHY CHEST WITH CONTRAST TECHNIQUE: Multidetector CT imaging of the chest was performed using the standard protocol during bolus administration of intravenous contrast. Multiplanar CT image reconstructions and MIPs were obtained to evaluate the vascular anatomy. RADIATION DOSE REDUCTION: This exam was performed according to the departmental dose-optimization program which includes automated exposure control, adjustment of the mA and/or kV according to patient size and/or use of iterative reconstruction technique. CONTRAST:  75mL OMNIPAQUE  IOHEXOL  350 MG/ML SOLN COMPARISON:  None Available. FINDINGS: Cardiovascular: Satisfactory opacification of the pulmonary arteries to the segmental level. No evidence of pulmonary embolism. Normal heart size. Left coronary artery calcifications. No pericardial effusion. Aortic atherosclerosis. Mediastinum/Nodes: No enlarged mediastinal, hilar, or axillary lymph nodes. Thyroid  gland, trachea, and esophagus demonstrate no significant findings. Lungs/Pleura: Trace pleural effusions. Small, masslike consolidation of the infrahilar right lower lobe (series 12, image 92). Heterogeneous airspace opacity in the adjacent right lower lobe posteriorly (series 12, image 43). Upper Abdomen: No acute abnormality. Calcific stigmata  chronic pancreatitis in the included upper abdomen. Musculoskeletal: No chest wall abnormality. No acute osseous findings. Review of the MIP images confirms the above findings. IMPRESSION: 1. Negative examination for pulmonary embolism. 2. Small, masslike consolidation of the infrahilar right lower lobe. Heterogeneous airspace opacity in  the adjacent right lower lobe posteriorly. Findings are possibly related to infection or aspiration although follow-up CT is recommended in 3 months to ensure resolution and exclude underlying malignancy. 3. Trace pleural effusions. 4. Coronary artery disease. Aortic Atherosclerosis (ICD10-I70.0). Electronically Signed   By: Marolyn JONETTA Jaksch M.D.   On: 09/25/2023 08:14   DG Chest Port 1 View Result Date: 09/25/2023 EXAM: 1 VIEW XRAY OF THE CHEST 09/25/2023 07:15:00 AM COMPARISON: 1 view chest x-ray 07/26/2023. CLINICAL HISTORY: 141880 SOB (shortness of breath) 141880. Pt in via [facility] from [facility] with sudden episode of sob and hemoptysis that reportedly woke her up at 0400. EMS states when they arrived, sats were in high 60's and she was cyanotic, wheezing in all fields. FINDINGS: LUNGS AND PLEURA: Mild edema is present. Bibasilar airspace opacities likely reflect atelectasis. Infection is not excluded. No pleural effusion. No pneumothorax. HEART AND MEDIASTINUM: Atherosclerotic calcifications are present at the aortic arch. No acute abnormality of the cardiac and mediastinal silhouettes. BONES AND SOFT TISSUES: No acute osseous abnormality. IMPRESSION: 1. Mild edema. 2. Bibasilar airspace opacities, likely atelectasis. Infection is not excluded. Electronically signed by: Lonni Necessary MD 09/25/2023 08:04 AM EDT RP Workstation: [id number]     .Critical Care  Performed by: Hoy Nidia FALCON, PA-C Authorized by: Hoy Nidia FALCON, PA-C   Critical care provider statement:    Critical care time (minutes):  30   Critical care was necessary to treat or  prevent imminent or life-threatening deterioration of the following conditions: PNA and hypoxia.   Critical care was time spent personally by me on the following activities:  Development of treatment plan with patient or surrogate, discussions with consultants, evaluation of patient's response to treatment, examination of patient, ordering and review of laboratory studies, ordering and review of radiographic studies, ordering and performing treatments and interventions, pulse oximetry, re-evaluation of patient's condition and review of old charts   Care discussed with: admitting provider   Comments:     PNA and hypoxia requiring multiple re-evaluations and hospital admission    Medications Ordered in the ED  ipratropium-albuterol  (DUONEB) 0.5-2.5 (3) MG/3ML nebulizer solution 3 mL (3 mLs Nebulization Given 09/25/23 0703)  iohexol  (OMNIPAQUE ) 350 MG/ML injection 75 mL (75 mLs Intravenous Contrast Given 09/25/23 0750)  cefTRIAXone  (ROCEPHIN ) 1 g in sodium chloride  0.9 % 100 mL IVPB (0 g Intravenous Stopped 09/25/23 1002)  azithromycin  (ZITHROMAX ) 500 mg in sodium chloride  0.9 % 250 mL IVPB (0 mg Intravenous Stopped 09/25/23 1042)                                    Medical Decision Making Amount and/or Complexity of Data Reviewed Labs: ordered.  Risk Prescription drug management. Decision regarding hospitalization.   This patient presents to the ED for concern of shortness of breath, this involves an extensive number of treatment options, and is a complaint that carries with it a high risk of complications and morbidity.  The differential diagnosis includes Anxiety, Anaphylaxis/Angioedema, Aspirated FB, Arrhythmia, CHF, Asthma, COPD, PNA, COVID/Flu/RSV, STEMI, Tamponade, TPNX, Sepsis   Co morbidities that complicate the patient evaluation  anxiety, OA, COPD, DM, HTN   Additional history obtained:  No PCP listed in chart   Problem List / ED Course / Critical interventions / Medication  management  Admitting patient for hypoxia and PNA. Patient presents to the ED concerned for hypoxia and hemoptysis. EMS noting hypoxia in the 60's and provided  patient with solumedrol and breathing treatments. In ED, patient is ill appearing. Physical exam with BL wheezing. Patient now at 91-92% on RA and I placed her on 1L [place] and will continue to monitor. Rest of vitals reassuring.  I Ordered, and personally interpreted labs.  CBC with leukocytosis at 11.8.  There is also mild anemia with hemoglobin 11.2.  BMP with hyponatremia 127.  Chloride is also low at 96.  Initial troponin within normal limits.  BNP elevated 249.  Lactic acid elevated at 2.7.  Respiratory panel negative. The patient was maintained on a cardiac monitor.  I personally viewed and interpreted the cardiac monitored which showed an underlying rhythm of: Sinus tachycardia I ordered imaging studies including chest xray/CTA chest to assess for process contributing to patient's symptoms. I independently visualized and interpreted imaging which showed RLL PNA vs aspiration and trace pleural effusion. I agree with the radiologist interpretation. Started patient on rocephin  and azithromycin . Also provided patient with a breathing treatment. Patient O2 now 96% on RA. I requested consultation with the hospitalist on-call Dr. Celinda,  and discussed lab and imaging findings as well as pertinent plan - they agree to admit patient. I have reviewed the patients home medicines and have made adjustments as needed   Social Determinants of Health:  geriatric       Final diagnoses:  Pneumonia of right lower lobe due to infectious organism  COPD exacerbation [facility])    ED Discharge Orders     None          Hoy Nidia FALCON, [place] 09/25/23 1114    Franklyn Sid SAILOR, MD 10/03/23 2011

## 2023-09-25 NOTE — ED Notes (Signed)
 Pt stated she can not hear in right ear

## 2023-09-25 NOTE — ED Triage Notes (Signed)
 Pt in via [facility] from [facility] with sudden episode of sob and hemoptysis that reportedly woke her up at 0400. EMS states when they arrived, sats were in high 60's and she was cyanotic, wheezing in all fields.   EMS VS & trx: 160/62 81HR 95% 4LNC Given 125mg  Solumedrol, 1 Albuterol  and 1 Duoneb PTA

## 2023-09-25 NOTE — Plan of Care (Signed)
  Problem: Respiratory: Goal: Ability to maintain a clear airway will improve Outcome: Progressing Goal: Levels of oxygenation will improve Outcome: Progressing Goal: Ability to maintain adequate ventilation will improve Outcome: Progressing   Problem: Activity: Goal: Ability to tolerate increased activity will improve Outcome: Progressing   Problem: Clinical Measurements: Goal: Respiratory complications will improve Outcome: Progressing

## 2023-09-25 NOTE — ED Notes (Signed)
 ED TO INPATIENT HANDOFF REPORT  ED Nurse Name and Phone #: Brooksie Cap RN [phone]  S Name/Age/Gender [name] 89 y.o. female Room/Bed: WA24/WA24  Code Status   Code Status: Prior  Home/SNF/Other Skilled nursing facility Patient oriented to: self, place, time, and situation Is this baseline? Yes   Triage Complete: Triage complete  Chief Complaint Acute respiratory failure with hypoxia (HCC) [J96.01]  Triage Note Pt in via [facility] from [facility] with sudden episode of sob and hemoptysis that reportedly woke her up at 0400. EMS states when they arrived, sats were in high 60's and she was cyanotic, wheezing in all fields.   EMS VS & trx: 160/62 81HR 95% 4LNC Given 125mg  Solumedrol, 1 Albuterol  and 1 Duoneb PTA   Allergies Allergies  Allergen Reactions   Lisinopril  Cough and Other (See Comments)   Chlordiazepoxide-Clidinium     Can't remember   Clarithromycin     Can't remember   Penicillins     Can't remember   Povidone-Iodine Other (See Comments)   Raloxifene Hydrochloride Other (See Comments)   Sertraline Hcl Other (See Comments)   Sulfacetamide Sodium Nausea And Vomiting   Sulfamethoxazole-Trimethoprim    Betadine [Povidone Iodine] Rash   Erythromycin Itching and Other (See Comments)    Can't remember   Hydrogen Peroxide Rash and Other (See Comments)   Sertraline Hcl Other (See Comments)    Sweating and mouth irritation    Sulfa Antibiotics Nausea And Vomiting and Other (See Comments)    Level of Care/Admitting Diagnosis ED Disposition     ED Disposition  Admit   Condition  --   Comment  Hospital Area: [facility] [[id number]]  Level of Care: Telemetry [5]  Admit to tele based on following criteria: Monitor for Ischemic changes  May admit patient to [facility] or [facility] if equivalent level of care is available:: No  Covid Evaluation: Asymptomatic - no recent exposure (last 10 days) testing not required   Diagnosis: Acute respiratory failure with hypoxia [facility]) [[id number]]  Admitting Physician: Danice Dural [[id number]]  Attending Physician: Danice Dural [[id number]]  Certification:: I certify this patient will need inpatient services for at least 2 midnights  Expected Medical Readiness: 09/27/2023          B Medical/Surgery History Past Medical History:  Diagnosis Date   Anxiety    Arthritis    Atrophic vaginitis    Cataract    COPD (chronic obstructive pulmonary disease) (HCC)    Depressive disorder, not elsewhere classified    Diabetes mellitus without complication (HCC)    Disorder of bone and cartilage, unspecified    Elevated blood sugar    Emphysema lung (HCC)    Hypertension    Other and unspecified hyperlipidemia    Postmenopausal    Rhinitis    Past Surgical History:  Procedure Laterality Date   APPENDECTOMY  1957   CATARACT EXTRACTION W/ INTRAOCULAR LENS IMPLANT Bilateral 2005   [place]   COLONOSCOPY N/A 03/08/2014   Procedure: COLONOSCOPY;  Surgeon: Ruby Corporal, MD;  Location: [facility];  Service: Endoscopy;  Laterality: N/A;   EYE SURGERY     JOINT REPLACEMENT     TOTAL HIP ARTHROPLASTY Left 12/27/2013   Procedure: LEFT TOTAL HIP ARTHROPLASTY;  Surgeon: Timothy Ford, MD;  Location: [facility] OR;  Service: Orthopedics;  Laterality: Left;     A IV Location/Drains/Wounds Patient Lines/Drains/Airways Status     Active Line/Drains/Airways     Name Placement  date Placement time Site Days   Peripheral IV 09/25/23 18 G Left;Posterior Forearm 09/25/23  0558  Forearm  less than 1   Peripheral IV 09/25/23 20 G Left Antecubital 09/25/23  0820  Antecubital  less than 1   Pressure Injury 07/27/23 Coccyx Stage 2 -  Partial thickness loss of dermis presenting as a shallow open injury with a red, pink wound bed without slough. 07/27/23  0800  -- 60            Intake/Output Last 24 hours No intake or output data in the 24 hours ending 09/25/23  1207  Labs/Imaging Results for orders placed or performed during the hospital encounter of 09/25/23 (from the past 48 hours)  Basic metabolic panel     Status: Abnormal   Collection Time: 09/25/23  6:25 AM  Result Value Ref Range   Sodium 127 (L) 135 - 145 mmol/L   Potassium 4.1 3.5 - 5.1 mmol/L   Chloride 96 (L) 98 - 111 mmol/L   CO2 22 22 - 32 mmol/L   Glucose, Bld 160 (H) 70 - 99 mg/dL    Comment: Glucose reference range applies only to samples taken after fasting for at least 8 hours.   BUN 18 8 - 23 mg/dL   Creatinine, Ser 1.19 0.44 - 1.00 mg/dL   Calcium 8.6 (L) 8.9 - 10.3 mg/dL   GFR, Estimated >14 >78 mL/min    Comment: (NOTE) Calculated using the CKD-EPI Creatinine Equation (2021)    Anion gap 9 5 - 15    Comment: Performed at [facility], [address], [place], [place] [address]  CBC     Status: Abnormal   Collection Time: 09/25/23  6:25 AM  Result Value Ref Range   WBC 11.8 (H) 4.0 - 10.5 K/uL   RBC 3.50 (L) 3.87 - 5.11 MIL/uL   Hemoglobin 11.2 (L) 12.0 - 15.0 g/dL   HCT 13.0 (L) 86.5 - 78.4 %   MCV 99.1 80.0 - 100.0 fL   MCH 32.0 26.0 - 34.0 pg   MCHC 32.3 30.0 - 36.0 g/dL   RDW 69.6 29.5 - 28.4 %   Platelets 365 150 - 400 K/uL   nRBC 0.0 0.0 - 0.2 %    Comment: Performed at [facility], [address] [place], [place] [address]  BNP (Order if Patient has history of Heart Failure)     Status: Abnormal   Collection Time: 09/25/23  6:25 AM  Result Value Ref Range   B Natriuretic Peptide 249.7 (H) 0.0 - 100.0 pg/mL    Comment: Performed at [facility], [address], [place], [place] [address]  Resp panel by RT-PCR (RSV, Flu A&B, Covid) Anterior Nasal Swab     Status: None   Collection Time: 09/25/23  7:00 AM   Specimen: Anterior Nasal Swab  Result Value Ref Range   SARS Coronavirus 2 by RT PCR NEGATIVE NEGATIVE    Comment: (NOTE) SARS-CoV-2 target nucleic acids are NOT DETECTED.  The SARS-CoV-2  RNA is generally detectable in upper respiratory specimens during the acute phase of infection. The lowest concentration of SARS-CoV-2 viral copies this assay can detect is 138 copies/mL. A negative result does not preclude SARS-Cov-2 infection and should not be used as the sole basis for treatment or other patient management decisions. A negative result may occur with  improper specimen collection/handling, submission of specimen other than nasopharyngeal swab, presence of viral mutation(s) within the areas targeted by  this assay, and inadequate number of viral copies(<138 copies/mL). A negative result must be combined with clinical observations, patient history, and epidemiological information. The expected result is Negative.  Fact Sheet for Patients:  BloggerCourse.com  Fact Sheet for Healthcare Providers:  SeriousBroker.it  This test is no t yet approved or cleared by the [place]  FDA and  has been authorized for detection and/or diagnosis of SARS-CoV-2 by FDA under an Emergency Use Authorization (EUA). This EUA will remain  in effect (meaning this test can be used) for the duration of the COVID-19 declaration under Section 564(b)(1) of the Act, 21 U.S.C.section 360bbb-3(b)(1), unless the authorization is terminated  or revoked sooner.       Influenza A by PCR NEGATIVE NEGATIVE   Influenza B by PCR NEGATIVE NEGATIVE    Comment: (NOTE) The Xpert Xpress SARS-CoV-2/FLU/RSV plus assay is intended as an aid in the diagnosis of influenza from Nasopharyngeal swab specimens and should not be used as a sole basis for treatment. Nasal washings and aspirates are unacceptable for Xpert Xpress SARS-CoV-2/FLU/RSV testing.  Fact Sheet for Patients: BloggerCourse.com  Fact Sheet for Healthcare Providers: SeriousBroker.it  This test is not yet approved or cleared by the [place]   FDA and has been authorized for detection and/or diagnosis of SARS-CoV-2 by FDA under an Emergency Use Authorization (EUA). This EUA will remain in effect (meaning this test can be used) for the duration of the COVID-19 declaration under Section 564(b)(1) of the Act, 21 U.S.C. section 360bbb-3(b)(1), unless the authorization is terminated or revoked.     Resp Syncytial Virus by PCR NEGATIVE NEGATIVE    Comment: (NOTE) Fact Sheet for Patients: BloggerCourse.com  Fact Sheet for Healthcare Providers: SeriousBroker.it  This test is not yet approved or cleared by the [place]  FDA and has been authorized for detection and/or diagnosis of SARS-CoV-2 by FDA under an Emergency Use Authorization (EUA). This EUA will remain in effect (meaning this test can be used) for the duration of the COVID-19 declaration under Section 564(b)(1) of the Act, 21 U.S.C. section 360bbb-3(b)(1), unless the authorization is terminated or revoked.  Performed at [facility], [address] [place], [place] [address]   Lactic acid, plasma     Status: Abnormal   Collection Time: 09/25/23  8:35 AM  Result Value Ref Range   Lactic Acid, Venous 2.7 (HH) 0.5 - 1.9 mmol/L    Comment: CRITICAL RESULT CALLED TO, READ BACK BY AND VERIFIED WITH Rexie Catena, H RN AT [phone] ON 09/25/2023 BY Anibal Kent Performed at [facility], [address], [place], [place] [address]   Troponin I (High Sensitivity)     Status: None   Collection Time: 09/25/23  8:50 AM  Result Value Ref Range   Troponin I (High Sensitivity) 11 <18 ng/L    Comment: (NOTE) Elevated high sensitivity troponin I (hsTnI) values and significant  changes across serial measurements may suggest ACS but many other  chronic and acute conditions are known to elevate hsTnI results.  Refer to the Links section for chest pain algorithms and additional  guidance. Performed at  [facility], [address], [place], [place] [address]    CT Angio Chest PE W and/or Wo Contrast Result Date: 09/25/2023 CLINICAL DATA:  PE suspected, shortness of breath, hemoptysis * Tracking Code: BO * EXAM: CT ANGIOGRAPHY CHEST WITH CONTRAST TECHNIQUE: Multidetector CT imaging of the chest was performed using the standard protocol during bolus administration of intravenous contrast. Multiplanar CT image  reconstructions and MIPs were obtained to evaluate the vascular anatomy. RADIATION DOSE REDUCTION: This exam was performed according to the departmental dose-optimization program which includes automated exposure control, adjustment of the mA and/or kV according to patient size and/or use of iterative reconstruction technique. CONTRAST:  75mL OMNIPAQUE IOHEXOL 350 MG/ML SOLN COMPARISON:  None Available. FINDINGS: Cardiovascular: Satisfactory opacification of the pulmonary arteries to the segmental level. No evidence of pulmonary embolism. Normal heart size. Left coronary artery calcifications. No pericardial effusion. Aortic atherosclerosis. Mediastinum/Nodes: No enlarged mediastinal, hilar, or axillary lymph nodes. Thyroid  gland, trachea, and esophagus demonstrate no significant findings. Lungs/Pleura: Trace pleural effusions. Small, masslike consolidation of the infrahilar right lower lobe (series 12, image 92). Heterogeneous airspace opacity in the adjacent right lower lobe posteriorly (series 12, image 43). Upper Abdomen: No acute abnormality. Calcific stigmata chronic pancreatitis in the included upper abdomen. Musculoskeletal: No chest wall abnormality. No acute osseous findings. Review of the MIP images confirms the above findings. IMPRESSION: 1. Negative examination for pulmonary embolism. 2. Small, masslike consolidation of the infrahilar right lower lobe. Heterogeneous airspace opacity in the adjacent right lower lobe posteriorly. Findings are possibly related to infection or  aspiration although follow-up CT is recommended in 3 months to ensure resolution and exclude underlying malignancy. 3. Trace pleural effusions. 4. Coronary artery disease. Aortic Atherosclerosis (ICD10-I70.0). Electronically Signed   By: Fredricka Jenny M.D.   On: 09/25/2023 08:14   DG Chest Port 1 View Result Date: 09/25/2023 EXAM: 1 VIEW XRAY OF THE CHEST 09/25/2023 07:15:00 AM COMPARISON: 1 view chest x-ray 07/26/2023. CLINICAL HISTORY: 141880 SOB (shortness of breath) 141880. Pt in via [facility] from [facility] with sudden episode of sob and hemoptysis that reportedly woke her up at 0400. EMS states when they arrived, sats were in high 60's and she was cyanotic, wheezing in all fields. FINDINGS: LUNGS AND PLEURA: Mild edema is present. Bibasilar airspace opacities likely reflect atelectasis. Infection is not excluded. No pleural effusion. No pneumothorax. HEART AND MEDIASTINUM: Atherosclerotic calcifications are present at the aortic arch. No acute abnormality of the cardiac and mediastinal silhouettes. BONES AND SOFT TISSUES: No acute osseous abnormality. IMPRESSION: 1. Mild edema. 2. Bibasilar airspace opacities, likely atelectasis. Infection is not excluded. Electronically signed by: Audree Leas MD 09/25/2023 08:04 AM EDT RP Workstation: [id number]    Pending Labs Unresulted Labs (From admission, onward)     Start     Ordered   09/25/23 0736  Blood culture (routine x 2)  BLOOD CULTURE X 2,   R (with STAT occurrences)      09/25/23 0736   09/25/23 0736  Lactic acid, plasma  (Lactic Acid)  Now then every 2 hours,   R (with STAT occurrences)      09/25/23 0736            Vitals/Pain Today's Vitals   09/25/23 0850 09/25/23 1000 09/25/23 1012 09/25/23 1030  BP:  129/66  (!) 144/57  Pulse: 91 92  87  Resp: 18 19  (!) 22  Temp:   98.7 F (37.1 C)   TempSrc:   Oral   SpO2: 94% 97%  96%  Weight:        Isolation Precautions No active isolations  Medications Medications   ipratropium-albuterol  (DUONEB) 0.5-2.5 (3) MG/3ML nebulizer solution 3 mL (3 mLs Nebulization Given 09/25/23 0703)  iohexol (OMNIPAQUE) 350 MG/ML injection 75 mL (75 mLs Intravenous Contrast Given 09/25/23 0750)  cefTRIAXone  (ROCEPHIN ) 1 g in sodium chloride  0.9 % 100 mL IVPB (0 g  Intravenous Stopped 09/25/23 1002)  azithromycin  (ZITHROMAX ) 500 mg in sodium chloride  0.9 % 250 mL IVPB (0 mg Intravenous Stopped 09/25/23 1042)    Mobility walks with device     Focused Assessments Neuro Assessment Handoff:  Swallow screen pass? Yes          Neuro Assessment:   Neuro Checks:      Has TPA been given? No If patient is a Neuro Trauma and patient is going to OR before floor call report to 4N Charge nurse: [phone] or [phone]   R Recommendations: See Admitting Provider Note  Report given to:   Additional Notes:

## 2023-09-25 NOTE — Progress Notes (Signed)
 Bedside RN notified of pended patient, reviewing SBAR, and placing purple man per policy

## 2023-09-26 ENCOUNTER — Inpatient Hospital Stay (HOSPITAL_COMMUNITY)

## 2023-09-26 DIAGNOSIS — E119 Type 2 diabetes mellitus without complications: Secondary | ICD-10-CM

## 2023-09-26 DIAGNOSIS — E7800 Pure hypercholesterolemia, unspecified: Secondary | ICD-10-CM

## 2023-09-26 DIAGNOSIS — I2510 Atherosclerotic heart disease of native coronary artery without angina pectoris: Secondary | ICD-10-CM

## 2023-09-26 DIAGNOSIS — F418 Other specified anxiety disorders: Secondary | ICD-10-CM | POA: Diagnosis not present

## 2023-09-26 DIAGNOSIS — J189 Pneumonia, unspecified organism: Secondary | ICD-10-CM

## 2023-09-26 DIAGNOSIS — R9431 Abnormal electrocardiogram [ECG] [EKG]: Secondary | ICD-10-CM | POA: Diagnosis not present

## 2023-09-26 DIAGNOSIS — J441 Chronic obstructive pulmonary disease with (acute) exacerbation: Secondary | ICD-10-CM | POA: Diagnosis not present

## 2023-09-26 DIAGNOSIS — R042 Hemoptysis: Secondary | ICD-10-CM

## 2023-09-26 DIAGNOSIS — J449 Chronic obstructive pulmonary disease, unspecified: Secondary | ICD-10-CM

## 2023-09-26 DIAGNOSIS — A419 Sepsis, unspecified organism: Secondary | ICD-10-CM

## 2023-09-26 DIAGNOSIS — J9601 Acute respiratory failure with hypoxia: Secondary | ICD-10-CM | POA: Diagnosis not present

## 2023-09-26 DIAGNOSIS — I10 Essential (primary) hypertension: Secondary | ICD-10-CM

## 2023-09-26 LAB — ECHOCARDIOGRAM COMPLETE
AR max vel: 2.13 cm2
AV Area VTI: 2.04 cm2
AV Area mean vel: 2.02 cm2
AV Mean grad: 3 mmHg
AV Peak grad: 5 mmHg
Ao pk vel: 1.12 m/s
Area-P 1/2: 4.06 cm2
Calc EF: 55.7 %
Height: 62 in
S' Lateral: 3 cm
Single Plane A2C EF: 55.4 %
Single Plane A4C EF: 53.8 %
Weight: 2243.4 [oz_av]

## 2023-09-26 LAB — GLUCOSE, CAPILLARY
Glucose-Capillary: 104 mg/dL — ABNORMAL HIGH (ref 70–99)
Glucose-Capillary: 118 mg/dL — ABNORMAL HIGH (ref 70–99)
Glucose-Capillary: 126 mg/dL — ABNORMAL HIGH (ref 70–99)
Glucose-Capillary: 92 mg/dL (ref 70–99)

## 2023-09-26 LAB — COMPREHENSIVE METABOLIC PANEL WITH GFR
ALT: 15 U/L (ref 0–44)
AST: 23 U/L (ref 15–41)
Albumin: 3.3 g/dL — ABNORMAL LOW (ref 3.5–5.0)
Alkaline Phosphatase: 61 U/L (ref 38–126)
Anion gap: 10 (ref 5–15)
BUN: 24 mg/dL — ABNORMAL HIGH (ref 8–23)
CO2: 21 mmol/L — ABNORMAL LOW (ref 22–32)
Calcium: 8.7 mg/dL — ABNORMAL LOW (ref 8.9–10.3)
Chloride: 96 mmol/L — ABNORMAL LOW (ref 98–111)
Creatinine, Ser: 0.73 mg/dL (ref 0.44–1.00)
GFR, Estimated: >60 mL/min (ref >60)
Glucose, Bld: 148 mg/dL — ABNORMAL HIGH (ref 70–99)
Potassium: 5 mmol/L (ref 3.5–5.1)
Sodium: 127 mmol/L — ABNORMAL LOW (ref 135–145)
Total Bilirubin: 0.6 mg/dL (ref 0.0–1.2)
Total Protein: 6.4 g/dL — ABNORMAL LOW (ref 6.5–8.1)

## 2023-09-26 LAB — LACTIC ACID, PLASMA
Lactic Acid, Venous: 0.9 mmol/L (ref 0.5–1.9)
Lactic Acid, Venous: 1.1 mmol/L (ref 0.5–1.9)

## 2023-09-26 LAB — CBC
HCT: 29.4 % — ABNORMAL LOW (ref 36.0–46.0)
Hemoglobin: 9.8 g/dL — ABNORMAL LOW (ref 12.0–15.0)
MCH: 31.9 pg (ref 26.0–34.0)
MCHC: 33.3 g/dL (ref 30.0–36.0)
MCV: 95.8 fL (ref 80.0–100.0)
Platelets: 348 10*3/uL (ref 150–400)
RBC: 3.07 MIL/uL — ABNORMAL LOW (ref 3.87–5.11)
RDW: 12.4 % (ref 11.5–15.5)
WBC: 10.4 10*3/uL (ref 4.0–10.5)
nRBC: 0 % (ref 0.0–0.2)

## 2023-09-26 LAB — PROCALCITONIN: Procalcitonin: <0.1 ng/mL

## 2023-09-26 MED ORDER — BENZONATATE 100 MG PO CAPS
100.0000 mg | ORAL_CAPSULE | Freq: Three times a day (TID) | ORAL | Status: DC | PRN
  Administered 2023-09-26 – 2023-10-01 (×8): 100 mg via ORAL
  Filled 2023-09-26 (×10): qty 1

## 2023-09-26 MED ORDER — LEVALBUTEROL HCL 0.63 MG/3ML IN NEBU
0.6300 mg | INHALATION_SOLUTION | Freq: Four times a day (QID) | RESPIRATORY_TRACT | Status: DC
  Administered 2023-09-26 (×3): 0.63 mg via RESPIRATORY_TRACT
  Filled 2023-09-26 (×5): qty 3

## 2023-09-26 MED ORDER — HYDROCODONE BIT-HOMATROP MBR 5-1.5 MG/5ML PO SOLN
5.0000 mL | Freq: Four times a day (QID) | ORAL | Status: AC | PRN
  Administered 2023-09-26: 5 mL via ORAL
  Filled 2023-09-26 (×2): qty 5

## 2023-09-26 MED ORDER — IPRATROPIUM BROMIDE 0.02 % IN SOLN
0.5000 mg | Freq: Four times a day (QID) | RESPIRATORY_TRACT | Status: DC
  Administered 2023-09-26 (×3): 0.5 mg via RESPIRATORY_TRACT
  Filled 2023-09-26 (×5): qty 2.5

## 2023-09-26 MED ORDER — IPRATROPIUM-ALBUTEROL 0.5-2.5 (3) MG/3ML IN SOLN
RESPIRATORY_TRACT | Status: AC
  Filled 2023-09-26: qty 3

## 2023-09-26 MED ORDER — PERFLUTREN LIPID MICROSPHERE
1.0000 mL | INTRAVENOUS | Status: AC | PRN
  Administered 2023-09-26: 2 mL via INTRAVENOUS

## 2023-09-26 MED ORDER — TRANEXAMIC ACID FOR INHALATION
500.0000 mg | Freq: Three times a day (TID) | RESPIRATORY_TRACT | Status: AC
  Administered 2023-09-26 – 2023-09-28 (×4): 500 mg via RESPIRATORY_TRACT
  Filled 2023-09-26 (×7): qty 10

## 2023-09-26 MED ORDER — IPRATROPIUM-ALBUTEROL 0.5-2.5 (3) MG/3ML IN SOLN
3.0000 mL | RESPIRATORY_TRACT | Status: AC | PRN
  Administered 2023-09-26: 3 mL via RESPIRATORY_TRACT
  Filled 2023-09-26: qty 3

## 2023-09-26 MED ORDER — TRANEXAMIC ACID FOR INHALATION
500.0000 mg | Freq: Once | RESPIRATORY_TRACT | Status: AC
  Administered 2023-09-26: 500 mg via RESPIRATORY_TRACT
  Filled 2023-09-26: qty 10

## 2023-09-26 NOTE — Progress Notes (Signed)
 PROGRESS NOTE    [name] [name]  NWG:[id number] DOB: 01/18/1932 DOA: 09/25/2023 PCP: Patient, No Pcp Per   Brief Narrative:  [name] [name] is a 89 y.o. female with medical history significant of anxiety, depression, osteoarthritis, atrophic vaginitis, cataracts, COPD, type 2 diabetes, hypertension, history of rhabdomyolysis, transaminitis, history of UTI who was sent from: [facility] with sudden onset of dyspnea and hemoptysis that woke her up around 0400.  O2 sat was in the 60s when EMS arrived.  She was placed on nasal cannula oxygen, given a DuoNeb, and albuterol  neb and 125 mg of Solu-Medrol . Continued to have Hemoptysis overnight so given TXA nebs. Pulmonary consulted and monitoring closely.  Assessment and Plan:  Acute Respiratory Failure with hypoxia (HCC) In the setting of:CAP (community acquired pneumonia) and COPD with acute exacerbation (HCC) With associated Sepsis (HCC): Admit to SDU/inpatient. Continue supplemental oxygen. Scheduled and as needed bronchodilators. Continue ceftriaxone  1 g IVPB daily and Continue azithromycin  500 mg IVPB daily. Check strep pneumoniae urinary antigen and Check sputum Gram stain, culture and sensitivity. Follow-up blood culture and sensitivity. Follow-up CBC and chemistry in the morning. Initially thought lactic acidosis from bronchodilators but is improved and is now 1.1. PCT is <0.10. WBC went from 11.8 -> 10.4. IVF held in the ED due to questionable pulmonary edema/elevated BNP. However, normal heart size on CTA chest. IVF now stopped. C/w Duoneb q4hprn SOB and Wheezing and C/w Xopenex/Atrovent. ECHO done for Dyspnea and showed Left ventricular ejection fraction, by estimation, is 50 to 55%. The left ventricle has low normal function. The left ventricle has no regional wall motion abnormalities. Left ventricular diastolic parameters are indeterminate. Repeat CXR in the AM   Hemoptysis: Given Tranexamic Acid 500 mg Neb and now being repeated q8h x2 Days. CTM  Blood Count.  CT scan done and was concerning for a consolidation and mass of the right fibrous base.  Continue antibiotics and bronchodilators.  Pulm consulted and given that she is quite frail and invasive investigation would not be in her best interest and recommending repeating follow-up in 2 to 3 months for hospitalization.  Depression with Anxiety: Continue Escitalopram  20 mg p.o. daily and Alprazolam  0.25 mg po Daily PRN Anxiety.   Type 2 diabetes mellitus without complication, without long-term current use of insulin  (HCC): Carbohydrate modified diet.Continue Hold Metformin . C/w Moderate Novolog  SSI AC. Check hemoglobin A1c.   Pressure Injury of Skin: Preventive measures.    Essential Hypertension: C/w Atenolol  25 mg po BID and Losartan  25 mg po Daily. CTM Monitor BP per Protocol. Last BP reading was    Hyperlipidemia: Continue Lovastatin  formulary equivalent of Pravastatin  40 mg po Daily.   Hypocalcemia: Mild. Calcium corrected for Albumin level is normal.    Metabolic Acidosis: Patient's CO2 is now 21, AG is 10, Chloride Level of 96. CTM and Trend and repeat CMP in the AM.   HypoNatremia: Na+ is now 127. CTM and Trend and repeat CMP in the AM   Normocytic Anemia: Hgb/Hct went from 11.2/34.7 -> 9.8/29.4. Check Anemia Panel in the AM. CTM for S/Sx of Bleeding; No overt bleeding noted. Repeat CBC in the AM  Hypoalbuminemia: Patient's Albumin Level is now 3.3. CTM and Trend and repeat CMP in the AM  Overweight: Complicates overall prognosis and care. Estimated body mass index is 25.65 kg/m as calculated from the following:   Height as of this encounter: 5' 2 (1.575 m).   Weight as of this encounter: 63.6 kg. Weight Loss and Dietary Counseling  given   DVT prophylaxis: Place and maintain sequential compression device Start: 09/26/23 0130 enoxaparin  (LOVENOX ) injection 40 mg Start: 09/25/23 2200    Code Status: Limited: Do not attempt resuscitation (DNR) -DNR-LIMITED -Do Not  Intubate/DNI  Family Communication: No family present @ beside  Disposition Plan:  Level of care: Stepdown Status is: Inpatient Remains inpatient appropriate because: Needs further clinical improvement and clearance by Pulmonary   Consultants:  Pulmonary   Procedures:  ECHO  Antimicrobials:  Anti-infectives (From admission, onward)    Start     Dose/Rate Route Frequency Ordered Stop   09/26/23 0800  cefTRIAXone  (ROCEPHIN ) 2 g in sodium chloride  0.9 % 100 mL IVPB        2 g 200 mL/hr over 30 Minutes Intravenous Every 24 hours 09/25/23 1318 09/30/23 0759   09/26/23 0800  azithromycin  (ZITHROMAX ) 500 mg in sodium chloride  0.9 % 250 mL IVPB        500 mg 250 mL/hr over 60 Minutes Intravenous Every 24 hours 09/25/23 1318 09/30/23 0759   09/25/23 0830  cefTRIAXone  (ROCEPHIN ) 1 g in sodium chloride  0.9 % 100 mL IVPB        1 g 200 mL/hr over 30 Minutes Intravenous  Once 09/25/23 0824 09/25/23 1002   09/25/23 0830  azithromycin  (ZITHROMAX ) 500 mg in sodium chloride  0.9 % 250 mL IVPB        500 mg 250 mL/hr over 60 Minutes Intravenous  Once 09/25/23 0824 09/25/23 1042       Subjective: Seen and examined at bedside and was having some hemoptysis overnight and a little bit of blood when she blew her nose.  Hemoptysis is stopped.  She has been getting TXA nebs now.  Pulmonary consulted.  She denies any chest pain or shortness breath now.  Wanting to eat.  No other concerns or complaints this time.  Objective: Vitals:   09/26/23 1900 09/26/23 1935 09/26/23 2000 09/26/23 2126  BP: 94/69     Pulse: 63  80   Resp: 17  16   Temp:    98.4 F (36.9 C)  TempSrc:    Oral  SpO2: 94% 95% 95%   Weight:      Height:        Intake/Output Summary (Last 24 hours) at 09/26/2023 2155 Last data filed at 09/26/2023 2127 Gross per 24 hour  Intake 1082.96 ml  Output 950 ml  Net 132.96 ml   Filed Weights   09/25/23 0634 09/25/23 1348  Weight: 58.5 kg 63.6 kg   Examination: Physical  Exam:  Constitutional: WN/WD overweight elderly Caucasian female in no acute distress Respiratory: Diminished to auscultation bilaterally, no wheezing, rales, rhonchi or crackles. Normal respiratory effort and patient is not tachypenic. No accessory muscle use.  Unlabored breathing Cardiovascular: RRR, no murmurs / rubs / gallops. S1 and S2 auscultated.  Minimal extremity edema Abdomen: Soft, non-tender, distended secondary to habitus. Bowel sounds positive.  GU: Deferred. Musculoskeletal: No clubbing / cyanosis of digits/nails. No joint deformity upper and lower extremities. Skin: No rashes, lesions, ulcers limited skin evaluation. No induration; Warm and dry.  Neurologic: CN 2-12 grossly intact with no focal deficits. Romberg sign and cerebellar reflexes not assessed.  Psychiatric: Normal judgment and insight. Alert and oriented x 3. Normal mood and appropriate affect.   Data Reviewed: I have personally reviewed following labs and imaging studies  CBC: Recent Labs  Lab 09/25/23 0625 09/26/23 0250  WBC 11.8* 10.4  HGB 11.2* 9.8*  HCT 34.7* 29.4*  MCV 99.1 95.8  PLT 365 348   Basic Metabolic Panel: Recent Labs  Lab 09/25/23 0625 09/26/23 0250  NA 127* 127*  K 4.1 5.0  CL 96* 96*  CO2 22 21*  GLUCOSE 160* 148*  BUN 18 24*  CREATININE 0.55 0.73  CALCIUM 8.6* 8.7*   GFR: Estimated Creatinine Clearance: 41.8 mL/min (by C-G formula based on SCr of 0.73 mg/dL). Liver Function Tests: Recent Labs  Lab 09/26/23 0250  AST 23  ALT 15  ALKPHOS 61  BILITOT 0.6  PROT 6.4*  ALBUMIN 3.3*   No results for input(s): LIPASE, AMYLASE in the last 168 hours. No results for input(s): AMMONIA in the last 168 hours. Coagulation Profile: No results for input(s): INR, PROTIME in the last 168 hours. Cardiac Enzymes: No results for input(s): CKTOTAL, CKMB, CKMBINDEX, TROPONINI in the last 168 hours. BNP (last 3 results) No results for input(s): PROBNP in the last  8760 hours. HbA1C: Recent Labs    09/25/23 1716  HGBA1C 6.3*   CBG: Recent Labs  Lab 09/25/23 2253 09/26/23 0756 09/26/23 1155 09/26/23 1614 09/26/23 2124  GLUCAP 171* 104* 118* 126* 92   Lipid Profile: No results for input(s): CHOL, HDL, LDLCALC, TRIG, CHOLHDL, LDLDIRECT in the last 72 hours. Thyroid  Function Tests: No results for input(s): TSH, T4TOTAL, FREET4, T3FREE, THYROIDAB in the last 72 hours. Anemia Panel: No results for input(s): VITAMINB12, FOLATE, FERRITIN, TIBC, IRON, RETICCTPCT in the last 72 hours. Sepsis Labs: Recent Labs  Lab 09/25/23 1716 09/25/23 2114 09/26/23 0840 09/26/23 0946  PROCALCITON  --   --  <0.10  --   LATICACIDVEN 3.2* 2.0* 0.9 1.1   Recent Results (from the past 240 hours)  Resp panel by RT-PCR (RSV, Flu A&B, Covid) Anterior Nasal Swab     Status: None   Collection Time: 09/25/23  7:00 AM   Specimen: Anterior Nasal Swab  Result Value Ref Range Status   SARS Coronavirus 2 by RT PCR NEGATIVE NEGATIVE Final    Comment: (NOTE) SARS-CoV-2 target nucleic acids are NOT DETECTED.  The SARS-CoV-2 RNA is generally detectable in upper respiratory specimens during the acute phase of infection. The lowest concentration of SARS-CoV-2 viral copies this assay can detect is 138 copies/mL. A negative result does not preclude SARS-Cov-2 infection and should not be used as the sole basis for treatment or other patient management decisions. A negative result may occur with  improper specimen collection/handling, submission of specimen other than nasopharyngeal swab, presence of viral mutation(s) within the areas targeted by this assay, and inadequate number of viral copies(<138 copies/mL). A negative result must be combined with clinical observations, patient history, and epidemiological information. The expected result is Negative.  Fact Sheet for Patients:  BloggerCourse.com  Fact Sheet  for Healthcare Providers:  SeriousBroker.it  This test is no t yet approved or cleared by the [place]  FDA and  has been authorized for detection and/or diagnosis of SARS-CoV-2 by FDA under an Emergency Use Authorization (EUA). This EUA will remain  in effect (meaning this test can be used) for the duration of the COVID-19 declaration under Section 564(b)(1) of the Act, 21 U.S.C.section 360bbb-3(b)(1), unless the authorization is terminated  or revoked sooner.       Influenza A by PCR NEGATIVE NEGATIVE Final   Influenza B by PCR NEGATIVE NEGATIVE Final    Comment: (NOTE) The Xpert Xpress SARS-CoV-2/FLU/RSV plus assay is intended as an aid in the diagnosis of influenza from Nasopharyngeal swab specimens and should not be  used as a sole basis for treatment. Nasal washings and aspirates are unacceptable for Xpert Xpress SARS-CoV-2/FLU/RSV testing.  Fact Sheet for Patients: BloggerCourse.com  Fact Sheet for Healthcare Providers: SeriousBroker.it  This test is not yet approved or cleared by the [place]  FDA and has been authorized for detection and/or diagnosis of SARS-CoV-2 by FDA under an Emergency Use Authorization (EUA). This EUA will remain in effect (meaning this test can be used) for the duration of the COVID-19 declaration under Section 564(b)(1) of the Act, 21 U.S.C. section 360bbb-3(b)(1), unless the authorization is terminated or revoked.     Resp Syncytial Virus by PCR NEGATIVE NEGATIVE Final    Comment: (NOTE) Fact Sheet for Patients: BloggerCourse.com  Fact Sheet for Healthcare Providers: SeriousBroker.it  This test is not yet approved or cleared by the [place]  FDA and has been authorized for detection and/or diagnosis of SARS-CoV-2 by FDA under an Emergency Use Authorization (EUA). This EUA will remain in effect (meaning  this test can be used) for the duration of the COVID-19 declaration under Section 564(b)(1) of the Act, 21 U.S.C. section 360bbb-3(b)(1), unless the authorization is terminated or revoked.  Performed at [facility], [address] [place], [place] [address]   Blood culture (routine x 2)     Status: None (Preliminary result)   Collection Time: 09/25/23  8:25 AM   Specimen: BLOOD  Result Value Ref Range Status   Specimen Description   Final    BLOOD LEFT ANTECUBITAL Performed at [facility], [address] [place], [place] [address]    Special Requests   Final    BOTTLES DRAWN AEROBIC AND ANAEROBIC Blood Culture results may not be optimal due to an inadequate volume of blood received in culture bottles Performed at [facility], [address], [place], [place] [address]    Culture   Final    NO GROWTH < 24 HOURS Performed at [facility], [address], [place], [place] [address]    Report Status PENDING  Incomplete  Blood culture (routine x 2)     Status: None (Preliminary result)   Collection Time: 09/25/23  8:35 AM   Specimen: BLOOD  Result Value Ref Range Status   Specimen Description   Final    BLOOD LEFT ANTECUBITAL Performed at [facility], [address], [place], [place] [address]    Special Requests   Final    BOTTLES DRAWN AEROBIC AND ANAEROBIC Blood Culture results may not be optimal due to an inadequate volume of blood received in culture bottles Performed at [facility], [address], [place], [place] [address]    Culture   Final    NO GROWTH < 24 HOURS Performed at [facility], [address] [place], [place] [address]    Report Status PENDING  Incomplete  MRSA Next Gen by PCR, Nasal     Status: None   Collection Time: 09/25/23  1:43 PM   Specimen: Nasal Mucosa; Nasal Swab  Result Value Ref Range Status   MRSA by PCR Next Gen NOT DETECTED  NOT DETECTED Final    Comment: (NOTE) The GeneXpert MRSA Assay (FDA approved for NASAL specimens only), is one component of a comprehensive MRSA colonization surveillance program. It is not intended to diagnose MRSA infection nor to guide or monitor treatment for MRSA infections. Test performance is not FDA approved in patients less than 18 years old. Performed at [facility], [address], [place], [place] [address]     Radiology Studies: ECHOCARDIOGRAM COMPLETE Result Date: 09/26/2023    ECHOCARDIOGRAM REPORT   Patient Name:   DESANI SPRUNG Date of Exam: 09/26/2023 Medical Rec #:  [id number]       Height:       62.0 in Accession #:    [id number]      Weight:       140.2 lb Date of Birth:  Dec 25, 1931       BSA:          1.644 m Patient Age:    89 years        BP:           115/37 mmHg Patient Gender: F               HR:           64 bpm. Exam Location:  Inpatient Procedure: 2D Echo, Color Doppler, Cardiac Doppler and Intracardiac            Opacification Agent (Both Spectral and Color Flow Doppler were            utilized during procedure). Indications:    Abnormal ECG                 CAD Native Vessel  History:        Patient has no prior history of Echocardiogram examinations.                 COPD; Risk Factors:Hypertension and Diabetes.  Sonographer:    Travis Friedman RDCS Referring Phys: [id number] DAVID MANUEL ORTIZ IMPRESSIONS  1. Left ventricular ejection fraction, by estimation, is 50 to 55%. The left ventricle has low normal function. The left ventricle has no regional wall motion abnormalities. Left ventricular diastolic parameters are indeterminate.  2. Right ventricular systolic function is normal. The right ventricular size is normal. There is mildly elevated pulmonary artery systolic pressure. The estimated right ventricular systolic pressure is 45.0 mmHg.  3. Right atrial size was moderately dilated.  4. The mitral valve is normal in structure. Trivial mitral valve  regurgitation. No evidence of mitral stenosis.  5. Tricuspid valve regurgitation is mild to moderate.  6. The aortic valve is tricuspid. Aortic valve regurgitation is trivial. Aortic valve sclerosis/calcification is present, without any evidence of aortic stenosis.  7. The inferior vena cava is dilated in size with >50% respiratory variability, suggesting right atrial pressure of 8 mmHg. FINDINGS  Left Ventricle: Left ventricular ejection fraction, by estimation, is 50 to 55%. The left ventricle has low normal function. The left ventricle has no regional wall motion abnormalities. The left ventricular internal cavity size was normal in size. There is no left ventricular hypertrophy. Left ventricular diastolic parameters are indeterminate. Right Ventricle: The right ventricular size is normal. No increase in right ventricular wall thickness. Right ventricular systolic function is normal. There is mildly elevated pulmonary artery systolic pressure. The tricuspid regurgitant velocity is 3.04  m/s, and with an assumed right atrial pressure of 8 mmHg, the estimated right ventricular systolic pressure is 45.0 mmHg. Left Atrium: Left atrial size was normal in size. Right Atrium: Right atrial size was moderately dilated. Pericardium: There is no evidence of pericardial effusion. Mitral Valve: The mitral valve is normal in structure. Trivial mitral valve regurgitation. No evidence of mitral valve stenosis. Tricuspid Valve: The tricuspid valve is normal in structure. Tricuspid valve regurgitation is mild to moderate. Aortic Valve: The aortic  valve is tricuspid. Aortic valve regurgitation is trivial. Aortic valve sclerosis/calcification is present, without any evidence of aortic stenosis. Aortic valve mean gradient measures 3.0 mmHg. Aortic valve peak gradient measures 5.0 mmHg. Aortic valve area, by VTI measures 2.04 cm. Pulmonic Valve: The pulmonic valve was not well visualized. Pulmonic valve regurgitation is not  visualized. Aorta: The aortic root and ascending aorta are structurally normal, with no evidence of dilitation. Venous: The inferior vena cava is dilated in size with greater than 50% respiratory variability, suggesting right atrial pressure of 8 mmHg. IAS/Shunts: The interatrial septum was not well visualized.  LEFT VENTRICLE PLAX 2D LVIDd:         4.10 cm     Diastology LVIDs:         3.00 cm     LV e' medial:    8.05 cm/s LV PW:         1.00 cm     LV E/e' medial:  10.4 LV IVS:        1.10 cm     LV e' lateral:   6.96 cm/s LVOT diam:     1.90 cm     LV E/e' lateral: 12.0 LV SV:         51 LV SV Index:   31 LVOT Area:     2.84 cm  LV Volumes (MOD) LV vol d, MOD A2C: 92.9 ml LV vol d, MOD A4C: 98.8 ml LV vol s, MOD A2C: 41.4 ml LV vol s, MOD A4C: 45.6 ml LV SV MOD A2C:     51.5 ml LV SV MOD A4C:     98.8 ml LV SV MOD BP:      54.6 ml RIGHT VENTRICLE             IVC RV S prime:     10.70 cm/s  IVC diam: 2.10 cm TAPSE (M-mode): 2.6 cm LEFT ATRIUM             Index        RIGHT ATRIUM           Index LA diam:        4.00 cm 2.43 cm/m   RA Area:     22.70 cm LA Vol (A2C):   61.4 ml 37.35 ml/m  RA Volume:   76.00 ml  46.23 ml/m LA Vol (A4C):   31.6 ml 19.22 ml/m LA Biplane Vol: 45.3 ml 27.56 ml/m  AORTIC VALVE AV Area (Vmax):    2.13 cm AV Area (Vmean):   2.02 cm AV Area (VTI):     2.04 cm AV Vmax:           112.00 cm/s AV Vmean:          78.500 cm/s AV VTI:            0.250 m AV Peak Grad:      5.0 mmHg AV Mean Grad:      3.0 mmHg LVOT Vmax:         84.20 cm/s LVOT Vmean:        55.800 cm/s LVOT VTI:          0.180 m LVOT/AV VTI ratio: 0.72  AORTA Ao Root diam: 2.90 cm Ao Asc diam:  2.80 cm MITRAL VALVE               TRICUSPID VALVE MV Area (PHT): 4.06 cm    TR Peak grad:   37.0 mmHg MV Decel Time: 187 msec  TR Vmax:        304.00 cm/s MV E velocity: 83.70 cm/s MV A velocity: 42.70 cm/s  SHUNTS MV E/A ratio:  1.96        Systemic VTI:  0.18 m                            Systemic Diam: 1.90 cm Carson Clara MD Electronically signed by Carson Clara MD Signature Date/Time: 09/26/2023/2:02:41 PM    Final    CT Angio Chest PE W and/or Wo Contrast Result Date: 09/25/2023 CLINICAL DATA:  PE suspected, shortness of breath, hemoptysis * Tracking Code: BO * EXAM: CT ANGIOGRAPHY CHEST WITH CONTRAST TECHNIQUE: Multidetector CT imaging of the chest was performed using the standard protocol during bolus administration of intravenous contrast. Multiplanar CT image reconstructions and MIPs were obtained to evaluate the vascular anatomy. RADIATION DOSE REDUCTION: This exam was performed according to the departmental dose-optimization program which includes automated exposure control, adjustment of the mA and/or kV according to patient size and/or use of iterative reconstruction technique. CONTRAST:  75mL OMNIPAQUE IOHEXOL 350 MG/ML SOLN COMPARISON:  None Available. FINDINGS: Cardiovascular: Satisfactory opacification of the pulmonary arteries to the segmental level. No evidence of pulmonary embolism. Normal heart size. Left coronary artery calcifications. No pericardial effusion. Aortic atherosclerosis. Mediastinum/Nodes: No enlarged mediastinal, hilar, or axillary lymph nodes. Thyroid  gland, trachea, and esophagus demonstrate no significant findings. Lungs/Pleura: Trace pleural effusions. Small, masslike consolidation of the infrahilar right lower lobe (series 12, image 92). Heterogeneous airspace opacity in the adjacent right lower lobe posteriorly (series 12, image 43). Upper Abdomen: No acute abnormality. Calcific stigmata chronic pancreatitis in the included upper abdomen. Musculoskeletal: No chest wall abnormality. No acute osseous findings. Review of the MIP images confirms the above findings. IMPRESSION: 1. Negative examination for pulmonary embolism. 2. Small, masslike consolidation of the infrahilar right lower lobe. Heterogeneous airspace opacity in the adjacent right lower lobe posteriorly. Findings are  possibly related to infection or aspiration although follow-up CT is recommended in 3 months to ensure resolution and exclude underlying malignancy. 3. Trace pleural effusions. 4. Coronary artery disease. Aortic Atherosclerosis (ICD10-I70.0). Electronically Signed   By: Fredricka Jenny M.D.   On: 09/25/2023 08:14   DG Chest Port 1 View Result Date: 09/25/2023 EXAM: 1 VIEW XRAY OF THE CHEST 09/25/2023 07:15:00 AM COMPARISON: 1 view chest x-ray 07/26/2023. CLINICAL HISTORY: 141880 SOB (shortness of breath) [id number]. Pt in via [facility] from [facility] with sudden episode of sob and hemoptysis that reportedly woke her up at 0400. EMS states when they arrived, sats were in high 60's and she was cyanotic, wheezing in all fields. FINDINGS: LUNGS AND PLEURA: Mild edema is present. Bibasilar airspace opacities likely reflect atelectasis. Infection is not excluded. No pleural effusion. No pneumothorax. HEART AND MEDIASTINUM: Atherosclerotic calcifications are present at the aortic arch. No acute abnormality of the cardiac and mediastinal silhouettes. BONES AND SOFT TISSUES: No acute osseous abnormality. IMPRESSION: 1. Mild edema. 2. Bibasilar airspace opacities, likely atelectasis. Infection is not excluded. Electronically signed by: Audree Leas MD 09/25/2023 08:04 AM EDT RP Workstation: [id number]   Scheduled Meds:  atenolol   25 mg Oral BID   calcium carbonate  500 mg of elemental calcium Oral Q breakfast   Chlorhexidine  Gluconate Cloth  6 each Topical Daily   cholecalciferol  2,000 Units Oral Daily   diclofenac  Sodium  2 g Topical QID   enoxaparin  (LOVENOX ) injection  40 mg Subcutaneous Q24H  escitalopram   20 mg Oral Daily   gabapentin   200 mg Oral TID   insulin  aspart  0-15 Units Subcutaneous TID WC   ipratropium  0.5 mg Nebulization Q6H   levalbuterol  0.63 mg Nebulization Q6H   losartan   25 mg Oral Daily   melatonin  5 mg Oral QHS   pravastatin   40 mg Oral q1800   tamsulosin   0.4 mg Oral  Daily   tranexamic acid  500 mg Nebulization Q8H   Continuous Infusions:  azithromycin  Stopped (09/26/23 0949)   cefTRIAXone  (ROCEPHIN )  IV Stopped (09/26/23 0829)    LOS: 1 day   Aura Leeds, DO [facility] Available via Epic secure chat 7am-7pm After these hours, please refer to coverage provider listed on [email or web address] 09/26/2023, 9:55 PM

## 2023-09-26 NOTE — Plan of Care (Signed)
  Problem: Education: Goal: Knowledge of disease or condition will improve Outcome: Progressing Goal: Knowledge of the prescribed therapeutic regimen will improve Outcome: Progressing Goal: Individualized Educational Video(s) Outcome: Progressing   Problem: Activity: Goal: Ability to tolerate increased activity will improve Outcome: Progressing Goal: Will verbalize the importance of balancing activity with adequate rest periods Outcome: Progressing   Problem: Respiratory: Goal: Ability to maintain a clear airway will improve Outcome: Progressing Goal: Levels of oxygenation will improve Outcome: Progressing Goal: Ability to maintain adequate ventilation will improve Outcome: Progressing   Problem: Activity: Goal: Ability to tolerate increased activity will improve Outcome: Progressing   Problem: Clinical Measurements: Goal: Ability to maintain a body temperature in the normal range will improve Outcome: Progressing   Problem: Respiratory: Goal: Ability to maintain adequate ventilation will improve Outcome: Progressing Goal: Ability to maintain a clear airway will improve Outcome: Progressing   Problem: Education: Goal: Knowledge of General Education information will improve Description: Including pain rating scale, medication(s)/side effects and non-pharmacologic comfort measures Outcome: Progressing   Problem: Health Behavior/Discharge Planning: Goal: Ability to manage health-related needs will improve Outcome: Progressing   Problem: Clinical Measurements: Goal: Ability to maintain clinical measurements within normal limits will improve Outcome: Progressing Goal: Will remain free from infection Outcome: Progressing Goal: Diagnostic test results will improve Outcome: Progressing Goal: Respiratory complications will improve Outcome: Progressing Goal: Cardiovascular complication will be avoided Outcome: Progressing   Problem: Activity: Goal: Risk for activity  intolerance will decrease Outcome: Progressing   Problem: Nutrition: Goal: Adequate nutrition will be maintained Outcome: Progressing   Problem: Coping: Goal: Level of anxiety will decrease Outcome: Progressing   Problem: Elimination: Goal: Will not experience complications related to bowel motility Outcome: Progressing Goal: Will not experience complications related to urinary retention Outcome: Progressing   Problem: Pain Managment: Goal: General experience of comfort will improve and/or be controlled Outcome: Progressing   Problem: Safety: Goal: Ability to remain free from injury will improve Outcome: Progressing   Problem: Skin Integrity: Goal: Risk for impaired skin integrity will decrease Outcome: Progressing   Problem: Education: Goal: Ability to describe self-care measures that may prevent or decrease complications (Diabetes Survival Skills Education) will improve Outcome: Progressing Goal: Individualized Educational Video(s) Outcome: Progressing   Problem: Coping: Goal: Ability to adjust to condition or change in health will improve Outcome: Progressing   Problem: Fluid Volume: Goal: Ability to maintain a balanced intake and output will improve Outcome: Progressing   Problem: Health Behavior/Discharge Planning: Goal: Ability to identify and utilize available resources and services will improve Outcome: Progressing Goal: Ability to manage health-related needs will improve Outcome: Progressing   Problem: Metabolic: Goal: Ability to maintain appropriate glucose levels will improve Outcome: Progressing   Problem: Nutritional: Goal: Maintenance of adequate nutrition will improve Outcome: Progressing Goal: Progress toward achieving an optimal weight will improve Outcome: Progressing   Problem: Skin Integrity: Goal: Risk for impaired skin integrity will decrease Outcome: Progressing   Problem: Tissue Perfusion: Goal: Adequacy of tissue perfusion will  improve Outcome: Progressing

## 2023-09-26 NOTE — Progress Notes (Signed)
     Patient Name: [name] [name]           DOB: [birth year]  MRN: [id number]      Admission Date: 09/25/2023  Attending Provider: Danice Dural, MD  Primary Diagnosis: Acute respiratory failure with hypoxia [facility])   Level of care: Stepdown   CROSS COVERAGE NOTE       Date of Service   09/26/2023   [name] [name], 89 y.o. female, was admitted on 09/25/2023 for Acute respiratory failure with hypoxia (HCC).    HPI/Events of Note   Hemoptysis triggered by excessive coughing Described by RN as bright red, ~1 cup volume with clots.  Became hypoxic, SpO2 ~80%.   Bedside Assessment:  Patient is awake, A/O, with mild respiratory distress.  Vital signs stable on 4L [place].  Still coughing, hemoptysis.  Respiratory: Bilaterally coarse. Mild wheezing. Tachypnea with accessory muscle use.  Cardiovascular: Regular rate and rhythm. 2+ pedal pulses. Abdomen: Soft and nontender. Positive bowel sounds in all quadrants.     Interventions/ Plan   Neb treatment as needed Cough meds PRN O2 therapy, goal SP02 >92 TXA neb for hemoptysis       [name] Lesser, DNP, ACNPC- AG [facility]

## 2023-09-26 NOTE — Consult Note (Signed)
 NAME:  [name] [name], MRN:  [id number], DOB:  [birth year], LOS: 1 ADMISSION DATE:  09/25/2023, CONSULTATION DATE:  09/26/2023 REFERRING MD:  Dr Gillermo Lack, CHIEF COMPLAINT:  Hemoptysis   History of Present Illness:  Patient with shortness of breath, hemoptysis-difficult to get a good history from her because she states she does not remember  History of anxiety, depression, osteoarthritis, COPD, type 2 diabetes, hypertension, UTI Woke up from sleep around 4 AM with hemoptysis, EMS found with saturations in the 60s, placed on nasal cannula was given bronchodilators  Patient could not tell me whether she was coughing prior to onset of symptoms or if she was feeling unwell prior to onset of symptoms Denies pain or discomfort, denies weight loss, Was not a smoker  Noted to have leg swelling, a CT scan of the chest was obtained - Right infrahilar consolidation, possible mass lesion  She is DNR  Pertinent  Medical History   Past Medical History:  Diagnosis Date   Anxiety    Arthritis    Atrophic vaginitis    Cataract    COPD (chronic obstructive pulmonary disease) (HCC)    Depressive disorder, not elsewhere classified    Diabetes mellitus without complication (HCC)    Disorder of bone and cartilage, unspecified    Elevated blood sugar    Emphysema lung (HCC)    Hypertension    Other and unspecified hyperlipidemia    Postmenopausal    Rhinitis    Significant Hospital Events: Including procedures, antibiotic start and stop dates in addition to other pertinent events   CT chest 09/26/2023-no pulmonary embolism small masslike consolidation in the infrahilar right lower lobe, trace pleural effusions  Interim History / Subjective:  Denies any pain or discomfort Denies feeling like she has a cough at present  Objective    Blood pressure (!) 119/41, pulse 75, temperature 98.2 F (36.8 C), temperature source Oral, resp. rate 17, height 5' 2 (1.575 m), weight 63.6 kg, SpO2 94%.    FiO2  (%):  [28 %] 28 %   Intake/Output Summary (Last 24 hours) at 09/26/2023 0934 Last data filed at 09/26/2023 0000 Gross per 24 hour  Intake 1212.64 ml  Output 200 ml  Net 1012.64 ml   Filed Weights   09/25/23 0634 09/25/23 1348  Weight: 58.5 kg 63.6 kg    Examination: General: Elderly, frail HENT: Moist oral mucosa Lungs: Decreased air movement bilaterally Cardiovascular: S1-S2 appreciated Abdomen: Soft, bowel sounds appreciated Extremities: No clubbing Neuro: Awake alert oriented to person GU:   I reviewed nursing notes, hospitalist notes, last 24 h vitals and pain scores, last 48 h intake and output, last 24 h labs and trends, and last 24 h imaging results. - Reviewed CT scan of the chest myself  Resolved problem list   Assessment and Plan  Hemoptysis - Continue close monitoring - Did have TXA treatments - H&H has been stable - Concerning CT scan findings-consolidation/mass in right infrahilar space - Appropriate to treat with a course of antibiotics, bronchodilators - Tranexamic acid every 8 for 2 days  She does have a history of COPD - Continue bronchodilator treatments  Continue oxygen supplementation  Patient is quite frail and invasive investigation may not be in her best interest  Will need CT follow-up in about 2 to 3 months to follow-up in the infrahilar lesion  Also noted to have blood from her nose so it is unclear whether she has epistaxis as a cause of the hemoptysis  Best Practice (right  click and Reselect all SmartList Selections daily)   Per primary  Labs   CBC: Recent Labs  Lab 09/25/23 0625 09/26/23 0250  WBC 11.8* 10.4  HGB 11.2* 9.8*  HCT 34.7* 29.4*  MCV 99.1 95.8  PLT 365 348    Basic Metabolic Panel: Recent Labs  Lab 09/25/23 0625 09/26/23 0250  NA 127* 127*  K 4.1 5.0  CL 96* 96*  CO2 22 21*  GLUCOSE 160* 148*  BUN 18 24*  CREATININE 0.55 0.73  CALCIUM 8.6* 8.7*   GFR: Estimated Creatinine Clearance: 41.8 mL/min  (by C-G formula based on SCr of 0.73 mg/dL). Recent Labs  Lab 09/25/23 0625 09/25/23 0835 09/25/23 1400 09/25/23 1716 09/25/23 2114 09/26/23 0250 09/26/23 0840  WBC 11.8*  --   --   --   --  10.4  --   LATICACIDVEN  --    < > 4.4* 3.2* 2.0*  --  0.9   < > = values in this interval not displayed.    Liver Function Tests: Recent Labs  Lab 09/26/23 0250  AST 23  ALT 15  ALKPHOS 61  BILITOT 0.6  PROT 6.4*  ALBUMIN 3.3*   No results for input(s): LIPASE, AMYLASE in the last 168 hours. No results for input(s): AMMONIA in the last 168 hours.  ABG No results found for: PHART, PCO2ART, PO2ART, HCO3, TCO2, ACIDBASEDEF, O2SAT   Coagulation Profile: No results for input(s): INR, PROTIME in the last 168 hours.  Cardiac Enzymes: No results for input(s): CKTOTAL, CKMB, CKMBINDEX, TROPONINI in the last 168 hours.  HbA1C: HB A1C (BAYER DCA - WAIVED)  Date/Time Value Ref Range Status  02/03/2019 09:43 AM 6.3 <7.0 % Final    Comment:                                          Diabetic Adult            <7.0                                       Healthy Adult        4.3 - 5.7                                                           (DCCT/NGSP) American Diabetes Association's Summary of Glycemic Recommendations for Adults with Diabetes: Hemoglobin A1c <7.0%. More stringent glycemic goals (A1c <6.0%) may further reduce complications at the cost of increased risk of hypoglycemia.   05/02/2018 10:00 AM 6.0 <7.0 % Final    Comment:                                          Diabetic Adult            <7.0                                       Healthy Adult  4.3 - 5.7                                                           (DCCT/NGSP) American Diabetes Association's Summary of Glycemic Recommendations for Adults with Diabetes: Hemoglobin A1c <7.0%. More stringent glycemic goals (A1c <6.0%) may further reduce complications at the cost of increased  risk of hypoglycemia.    Hgb A1c MFr Bld  Date/Time Value Ref Range Status  09/25/2023 05:16 PM 6.3 (H) 4.8 - 5.6 % Final    Comment:    (NOTE) Diagnosis of Diabetes The following HbA1c ranges recommended by the American Diabetes Association (ADA) may be used as an aid in the diagnosis of diabetes mellitus.  Hemoglobin             Suggested A1C NGSP%              Diagnosis  <5.7                   Non Diabetic  5.7-6.4                Pre-Diabetic  >6.4                   Diabetic  <7.0                   Glycemic control for                       adults with diabetes.    01/12/2023 05:46 AM 6.4 (H) 4.8 - 5.6 % Final    Comment:    (NOTE) Pre diabetes:          5.7%-6.4%  Diabetes:              >6.4%  Glycemic control for   <7.0% adults with diabetes     CBG: Recent Labs  Lab 09/25/23 2253 09/26/23 0756  GLUCAP 171* 104*    Review of Systems:   Denies any significant symptoms at present  Past Medical History:  She,  has a past medical history of Anxiety, Arthritis, Atrophic vaginitis, Cataract, COPD (chronic obstructive pulmonary disease) (HCC), Depressive disorder, not elsewhere classified, Diabetes mellitus without complication (HCC), Disorder of bone and cartilage, unspecified, Elevated blood sugar, Emphysema lung (HCC), Hypertension, Other and unspecified hyperlipidemia, Postmenopausal, and Rhinitis.   Surgical History:   Past Surgical History:  Procedure Laterality Date   APPENDECTOMY  1957   CATARACT EXTRACTION W/ INTRAOCULAR LENS IMPLANT Bilateral 2005   [place]   COLONOSCOPY N/A 03/08/2014   Procedure: COLONOSCOPY;  Surgeon: Ruby Corporal, MD;  Location: [facility];  Service: Endoscopy;  Laterality: N/A;   EYE SURGERY     JOINT REPLACEMENT     TOTAL HIP ARTHROPLASTY Left 12/27/2013   Procedure: LEFT TOTAL HIP ARTHROPLASTY;  Surgeon: Timothy Ford, MD;  Location: [facility] OR;  Service: Orthopedics;  Laterality: Left;     Social History:   reports  that she has never smoked. She has never used smokeless tobacco. She reports that she does not drink alcohol and does not use drugs.   Family History:  Her family history includes Aneurysm in her sister; Arthritis in her mother; COPD in her daughter and father; Diabetes in her father, sister, and sister;  Fibromyalgia in her daughter; Healthy in her daughter; Hypertension in her sister.   Allergies Allergies  Allergen Reactions   Lisinopril  Cough   Penicillins Other (See Comments)    Can't remember   Betadine [Povidone Iodine] Rash   Hydrogen Peroxide Rash   Sertraline Hcl Other (See Comments)    Sweating and mouth irritation    Sulfa Antibiotics Nausea And Vomiting    Myer Artis, MD [facility] Pager: See [place] Foley

## 2023-09-26 NOTE — Evaluation (Signed)
 Clinical/Bedside Swallow Evaluation Patient Details  Name: [name] [name] MRN: [id number] Date of Birth: [birth year]  Today's Date: 09/26/2023 Time: SLP Start Time (ACUTE ONLY): 1450 SLP Stop Time (ACUTE ONLY): 1506 SLP Time Calculation (min) (ACUTE ONLY): 16 min  Past Medical History:  Past Medical History:  Diagnosis Date   Anxiety    Arthritis    Atrophic vaginitis    Cataract    COPD (chronic obstructive pulmonary disease) (HCC)    Depressive disorder, not elsewhere classified    Diabetes mellitus without complication (HCC)    Disorder of bone and cartilage, unspecified    Elevated blood sugar    Emphysema lung (HCC)    Hypertension    Other and unspecified hyperlipidemia    Postmenopausal    Rhinitis    Past Surgical History:  Past Surgical History:  Procedure Laterality Date   APPENDECTOMY  1957   CATARACT EXTRACTION W/ INTRAOCULAR LENS IMPLANT Bilateral 2005   [place]   COLONOSCOPY N/A 03/08/2014   Procedure: COLONOSCOPY;  Surgeon: Ruby Corporal, MD;  Location: [facility];  Service: Endoscopy;  Laterality: N/A;   EYE SURGERY     JOINT REPLACEMENT     TOTAL HIP ARTHROPLASTY Left 12/27/2013   Procedure: LEFT TOTAL HIP ARTHROPLASTY;  Surgeon: Timothy Ford, MD;  Location: [facility] OR;  Service: Orthopedics;  Laterality: Left;   HPI:  History of anxiety, depression, osteoarthritis, COPD, type 2 diabetes, hypertension, UTI  Woke up from sleep around 4 AM with hemoptysis, EMS found with saturations in the 60s, placed on nasal cannula was given bronchodilators. CT scan of the chest was obtained- Right infrahilar consolidation, possible mass lesion. Has been observed to cough with drinking.    Assessment / Plan / Recommendation  Clinical Impression  Pt demonstrates a slight cough after each sip of thin liquids. She is not aware of this and denies any difficulty swallowing. She was able to consume puree without any coughing. Recommend MBS for instrumental assessment of  swallowing. Pt to continue sips of water  and soft solids. Pills can be given whole in puree. Pt says this is what she normally does because of  some struggle coordinating pills and water  due to her tremor. SLP Visit Diagnosis: Dysphagia, oropharyngeal phase (R13.12)    Aspiration Risk  Moderate aspiration risk    Diet Recommendation Dysphagia 3 (Mech soft);Thin liquid    Liquid Administration via: Straw;Cup Medication Administration: Whole meds with puree Supervision: Patient able to self feed Compensations: Slow rate;Small sips/bites Postural Changes: Seated upright at 90 degrees         Prognosis Prognosis for improved oropharyngeal function: Good      Swallow Study   General HPI: History of anxiety, depression, osteoarthritis, COPD, type 2 diabetes, hypertension, UTI  Woke up from sleep around 4 AM with hemoptysis, EMS found with saturations in the 60s, placed on nasal cannula was given bronchodilators. CT scan of the chest was obtained- Right infrahilar consolidation, possible mass lesion. Has been observed to cough with drinking. Type of Study: Bedside Swallow Evaluation Previous Swallow Assessment: none Diet Prior to this Study: Dysphagia 3 (mechanical soft);Thin liquids (Level 0) Temperature Spikes Noted: No Respiratory Status: Room air History of Recent Intubation: No Behavior/Cognition: Alert;Cooperative;Pleasant mood Oral Cavity Assessment: Within Functional Limits Oral Care Completed by SLP: No Oral Cavity - Dentition: Adequate natural dentition Vision: Functional for self-feeding Self-Feeding Abilities: Able to feed self Patient Positioning: Upright in bed Baseline Vocal Quality: Normal Volitional Cough: Strong    Oral/Motor/Sensory  Function Overall Oral Motor/Sensory Function: Within functional limits   Ice Chips     Thin Liquid Thin Liquid: Impaired Presentation: Straw Pharyngeal  Phase Impairments: Cough - Immediate    Nectar Thick Nectar Thick Liquid: Not  tested   Honey Thick Honey Thick Liquid: Not tested   Puree Puree: Within functional limits Presentation: Spoon   Solid     Solid: Not tested     Noble Bateman, MA CCC-SLP  [facility] Secure Chat Preferred Office [phone]  Valeri Gate 09/26/2023,3:11 PM

## 2023-09-26 NOTE — Progress Notes (Signed)
 Echocardiogram 2D Echocardiogram has been performed.  Sherlin Sonier N Lataya Varnell,RDCS 09/26/2023, 1:21 PM

## 2023-09-26 NOTE — Hospital Course (Signed)
 [name] [name] is a 89 y.o. female with medical history significant of anxiety, depression, osteoarthritis, atrophic vaginitis, cataracts, COPD, type 2 diabetes, hypertension, history of rhabdomyolysis, transaminitis, history of UTI who was sent from: [facility] with sudden onset of dyspnea and hemoptysis that woke her up around 0400.  O2 sat was in the 60s when EMS arrived.  She was placed on nasal cannula oxygen, given a DuoNeb, and albuterol  neb and 125 mg of Solu-Medrol . Continued to have Hemoptysis so given TXA nebs. Pulmonary consulted and monitoring closely.  Patient was witnessed aspirating and SLP done and shows that she has significant reflux causing aspiration.  She has now been placed on a dysphagia 3 diet with nectar thick liquids.  We have changed her antibiotics to cover aspiration by adding Flagyl .  She is now on ceftriaxone  and Flagyl .  She will need an Ambulatory home O2 screen and repeat chest x-ray in AM.  Pulmonary recommending continuing antibiotics, bronchodilators, cough suppressants/expectorants.  Assessment and Plan:  Acute Respiratory Failure with hypoxia (HCC) In the setting of:CAP (community acquired pneumonia) and COPD with acute exacerbation (HCC) With associated Sepsis (HCC): Admit to SDU/inpatient. Continue supplemental oxygen. Scheduled and as needed bronchodilators. Continue ceftriaxone  1 g IVPB daily but change azithromycin  to Flagyl  given that she is aspirating.  Check strep pneumoniae urinary antigen and Check sputum Gram stain, culture and sensitivity. Follow-up blood culture and sensitivity. Follow-up CBC and chemistry in the morning.   -SLP evaluation done and MBS done she has quite a bit of reflux with retrograde noted of the majority of barium going to the level of thoracic esophagus and now SLP is recommending dysphagia 3 diet with nectar thick liquids. Initially thought lactic acidosis from bronchodilators but is improved and is now 1.1. PCT is <0.10. WBC went from  11.8 -> 10.4 -> 10.8. IVF held in the ED due to questionable pulmonary edema/elevated BNP. However, normal heart size on CTA chest. C/w Duoneb q4hprn SOB and Wheezing and Xopenex/Atrovent changed to Brovana and Budesonide .  -ECHO done for Dyspnea and showed Left ventricular ejection fraction, by estimation, is 50 to 55%. The left ventricle has low normal function. The left ventricle has no regional wall motion abnormalities. Left ventricular diastolic parameters are indeterminate. Repeat CXR showed Interval progression of bibasilar airspace opacity compatible w/ atelectasis or infectio and New tiny bilateral pleural effusions. -Check amatory home O2 screen in the a.m. and will need a repeat chest x-ray prior to discharge   Hemoptysis: Getting Tranexamic Acid 500 mg Neb and was being repeated q8h x2 Days. CTM Blood Count closely   CT scan done and was concerning for a consolidation and mass of the right fibrous base.  Continue antibiotics and bronchodilators.  Pulm consulted and given that she is quite frail and invasive investigation would not be in her best interest and recommending repeating CT for follow-up in 2 to 3 months for further evaluation of Infrahilar Lesion  Depression with Anxiety: Continue Escitalopram  20 mg p.o. daily and Alprazolam  0.25 mg po Daily PRN Anxiety.   Type 2 diabetes mellitus without complication, without long-term current use of insulin  (HCC): Carbohydrate modified diet.Continue Hold Metformin . C/w Moderate Novolog  SSI AC. Check hemoglobin A1c and was 6.3. CBG's ranging from 106-206   Pressure Injury of Skin: Preventive measures.    Essential Hypertension: C/w Atenolol  25 mg po BID and Losartan  25 mg po Daily. CTM Monitor BP per Protocol. Last BP reading was 121/33   Hyperlipidemia: Continue Lovastatin  formulary equivalent of Pravastatin   40 mg po Daily.   Hypocalcemia: Mild. Calcium corrected for Albumin level is normal.    Metabolic Acidosis: Mild. Patient's CO2 is  now 21, AG is 8, Chloride Level of 99. CTM and Trend and repeat CMP in the AM.   HypoNatremia: Na+ is now 128. CTM and Trend and repeat CMP in the AM   Normocytic Anemia: Hgb/Hct went from 11.2/34.7 -> 9.8/29.4 -> 9.2/28.7 -> 9.6/30.8. Check Anemia Panel in the AM. CTM for S/Sx of Bleeding; No overt bleeding noted. Repeat CBC in the AM  Hypoalbuminemia: Patient's Albumin Level is now 3.1. CTM and Trend and repeat CMP in the AM  Overweight: Complicates overall prognosis and care. Estimated body mass index is 25.65 kg/m as calculated from the following:   Height as of this encounter: 5' 2 (1.575 m).   Weight as of this encounter: 63.6 kg. Weight Loss and Dietary Counseling given

## 2023-09-27 ENCOUNTER — Inpatient Hospital Stay (HOSPITAL_COMMUNITY)

## 2023-09-27 DIAGNOSIS — J690 Pneumonitis due to inhalation of food and vomit: Secondary | ICD-10-CM

## 2023-09-27 DIAGNOSIS — R042 Hemoptysis: Secondary | ICD-10-CM

## 2023-09-27 DIAGNOSIS — J9601 Acute respiratory failure with hypoxia: Secondary | ICD-10-CM | POA: Diagnosis not present

## 2023-09-27 DIAGNOSIS — J441 Chronic obstructive pulmonary disease with (acute) exacerbation: Secondary | ICD-10-CM

## 2023-09-27 LAB — COMPREHENSIVE METABOLIC PANEL WITH GFR
ALT: 15 U/L (ref 0–44)
AST: 28 U/L (ref 15–41)
Albumin: 3.3 g/dL — ABNORMAL LOW (ref 3.5–5.0)
Alkaline Phosphatase: 52 U/L (ref 38–126)
Anion gap: 10 (ref 5–15)
BUN: 26 mg/dL — ABNORMAL HIGH (ref 8–23)
CO2: 21 mmol/L — ABNORMAL LOW (ref 22–32)
Calcium: 8.5 mg/dL — ABNORMAL LOW (ref 8.9–10.3)
Chloride: 96 mmol/L — ABNORMAL LOW (ref 98–111)
Creatinine, Ser: 0.76 mg/dL (ref 0.44–1.00)
GFR, Estimated: >60 mL/min (ref >60)
Glucose, Bld: 97 mg/dL (ref 70–99)
Potassium: 4.4 mmol/L (ref 3.5–5.1)
Sodium: 127 mmol/L — ABNORMAL LOW (ref 135–145)
Total Bilirubin: 0.5 mg/dL (ref 0.0–1.2)
Total Protein: 6.2 g/dL — ABNORMAL LOW (ref 6.5–8.1)

## 2023-09-27 LAB — GLUCOSE, CAPILLARY
Glucose-Capillary: 100 mg/dL — ABNORMAL HIGH (ref 70–99)
Glucose-Capillary: 120 mg/dL — ABNORMAL HIGH (ref 70–99)
Glucose-Capillary: 118 mg/dL — ABNORMAL HIGH (ref 70–99)
Glucose-Capillary: 119 mg/dL — ABNORMAL HIGH (ref 70–99)

## 2023-09-27 LAB — CBC WITH DIFFERENTIAL/PLATELET
Abs Immature Granulocytes: 0.04 10*3/uL (ref 0.00–0.07)
Basophils Absolute: 0 10*3/uL (ref 0.0–0.1)
Basophils Relative: 0 %
Eosinophils Absolute: 0.2 10*3/uL (ref 0.0–0.5)
Eosinophils Relative: 2 %
HCT: 28.7 % — ABNORMAL LOW (ref 36.0–46.0)
Hemoglobin: 9.2 g/dL — ABNORMAL LOW (ref 12.0–15.0)
Immature Granulocytes: 0 %
Lymphocytes Relative: 22 %
Lymphs Abs: 2.4 10*3/uL (ref 0.7–4.0)
MCH: 31.9 pg (ref 26.0–34.0)
MCHC: 32.1 g/dL (ref 30.0–36.0)
MCV: 99.7 fL (ref 80.0–100.0)
Monocytes Absolute: 0.8 10*3/uL (ref 0.1–1.0)
Monocytes Relative: 7 %
Neutro Abs: 7.4 10*3/uL (ref 1.7–7.7)
Neutrophils Relative %: 69 %
Platelets: 310 10*3/uL (ref 150–400)
RBC: 2.88 MIL/uL — ABNORMAL LOW (ref 3.87–5.11)
RDW: 12.7 % (ref 11.5–15.5)
WBC: 10.8 10*3/uL — ABNORMAL HIGH (ref 4.0–10.5)
nRBC: 0 % (ref 0.0–0.2)

## 2023-09-27 LAB — MAGNESIUM: Magnesium: 1.8 mg/dL (ref 1.7–2.4)

## 2023-09-27 LAB — PHOSPHORUS: Phosphorus: 3.1 mg/dL (ref 2.5–4.6)

## 2023-09-27 MED ORDER — MAGNESIUM SULFATE 2 GM/50ML IV SOLN
2.0000 g | Freq: Once | INTRAVENOUS | Status: AC
  Filled 2023-09-27: qty 50

## 2023-09-27 MED ORDER — PANTOPRAZOLE SODIUM 40 MG IV SOLR
40.0000 mg | Freq: Two times a day (BID) | INTRAVENOUS | Status: DC
  Administered 2023-09-28 – 2023-09-29 (×4): 40 mg via INTRAVENOUS
  Filled 2023-09-27 (×7): qty 10

## 2023-09-27 MED ORDER — ARFORMOTEROL TARTRATE 15 MCG/2ML IN NEBU
15.0000 ug | INHALATION_SOLUTION | Freq: Two times a day (BID) | RESPIRATORY_TRACT | Status: DC
  Administered 2023-09-28 – 2023-10-01 (×5): 15 ug via RESPIRATORY_TRACT
  Filled 2023-09-27 (×9): qty 2

## 2023-09-27 MED ORDER — STERILE WATER FOR INJECTION IJ SOLN
INTRAMUSCULAR | Status: AC
  Filled 2023-09-27: qty 10

## 2023-09-27 MED ORDER — BUDESONIDE 0.5 MG/2ML IN SUSP
0.5000 mg | Freq: Two times a day (BID) | RESPIRATORY_TRACT | Status: DC
  Administered 2023-09-28 – 2023-10-01 (×5): 0.5 mg via RESPIRATORY_TRACT
  Filled 2023-09-27 (×9): qty 2

## 2023-09-27 MED ORDER — METRONIDAZOLE 500 MG/100ML IV SOLN
500.0000 mg | Freq: Two times a day (BID) | INTRAVENOUS | Status: DC
  Administered 2023-09-28 – 2023-10-01 (×5): 500 mg via INTRAVENOUS
  Filled 2023-09-27 (×9): qty 100

## 2023-09-27 NOTE — Progress Notes (Signed)
 PROGRESS NOTE    [name] [name]  NWG:[id number] DOB: Aug 30, 1931 DOA: 09/25/2023 PCP: Patient, No Pcp Per   Brief Narrative:  [name] [name] is a 89 y.o. female with medical history significant of anxiety, depression, osteoarthritis, atrophic vaginitis, cataracts, COPD, type 2 diabetes, hypertension, history of rhabdomyolysis, transaminitis, history of UTI who was sent from: [facility] with sudden onset of dyspnea and hemoptysis that woke her up around 0400.  O2 sat was in the 60s when EMS arrived.  She was placed on nasal cannula oxygen, given a DuoNeb, and albuterol  neb and 125 mg of Solu-Medrol . Continued to have Hemoptysis overnight so given TXA nebs. Pulmonary consulted and monitoring closely.  Today the patient was witnessed to aspiration SLP done and shows that she has significant reflux causing aspiration.  She has not been placed on a dysphagia 3 diet with nectar thick liquids.  We have changed her antibiotics to cover aspiration by adding Flagyl .  She is now on ceftriaxone  and Flagyl .  She will need an amatory home O2 screen and repeat chest x-ray in AM.  Assessment and Plan:  Acute Respiratory Failure with hypoxia (HCC) In the setting of:CAP (community acquired pneumonia) and COPD with acute exacerbation (HCC) With associated Sepsis (HCC): Admit to SDU/inpatient. Continue supplemental oxygen. Scheduled and as needed bronchodilators. Continue ceftriaxone  1 g IVPB daily but change azithromycin  to Flagyl  given that she is aspirating.  Check strep pneumoniae urinary antigen and Check sputum Gram stain, culture and sensitivity. Follow-up blood culture and sensitivity. Follow-up CBC and chemistry in the morning.   -SLP evaluation done and MBS done she has quite a bit of reflux with retrograde noted of the majority of barium going to the level of thoracic esophagus and now SLP is recommending dysphagia 3 diet with nectar thick liquids. Initially thought lactic acidosis from bronchodilators but is  improved and is now 1.1. PCT is <0.10. WBC went from 11.8 -> 10.4 -> 10.8. IVF held in the ED due to questionable pulmonary edema/elevated BNP. However, normal heart size on CTA chest. C/w Duoneb q4hprn SOB and Wheezing and Xopenex/Atrovent changed to Brovana and Budesonide .  -ECHO done for Dyspnea and showed Left ventricular ejection fraction, by estimation, is 50 to 55%. The left ventricle has low normal function. The left ventricle has no regional wall motion abnormalities. Left ventricular diastolic parameters are indeterminate. Repeat CXR this AM showed Patchy opacity in the medial right base compatible with airspace disease seen in this region on CT scan 2 days ago.   Hemoptysis: Getting Tranexamic Acid 500 mg Neb and now being repeated q8h x2 Days. CTM Blood Count.  CT scan done and was concerning for a consolidation and mass of the right fibrous base.  Continue antibiotics and bronchodilators.  Pulm consulted and given that she is quite frail and invasive investigation would not be in her best interest and recommending repeating follow-up in 2 to 3 months for hospitalization.  Depression with Anxiety: Continue Escitalopram  20 mg p.o. daily and Alprazolam  0.25 mg po Daily PRN Anxiety.   Type 2 diabetes mellitus without complication, without long-term current use of insulin  (HCC): Carbohydrate modified diet.Continue Hold Metformin . C/w Moderate Novolog  SSI AC. Check hemoglobin A1c.   Pressure Injury of Skin: Preventive measures.    Essential Hypertension: C/w Atenolol  25 mg po BID and Losartan  25 mg po Daily. CTM Monitor BP per Protocol. Last BP reading was 139/71   Hyperlipidemia: Continue Lovastatin  formulary equivalent of Pravastatin  40 mg po Daily.   Hypocalcemia: Mild.  Calcium corrected for Albumin level is normal.    Metabolic Acidosis: Mild. Patient's CO2 is now 21, AG is 10, Chloride Level of 96. CTM and Trend and repeat CMP in the AM.   HypoNatremia: Na+ is now 127 again. CTM and  Trend and repeat CMP in the AM   Normocytic Anemia: Hgb/Hct went from 11.2/34.7 -> 9.8/29.4 -> 9.2/28.7. Check Anemia Panel in the AM. CTM for S/Sx of Bleeding; No overt bleeding noted. Repeat CBC in the AM  Hypoalbuminemia: Patient's Albumin Level is now 3.3. CTM and Trend and repeat CMP in the AM  Overweight: Complicates overall prognosis and care. Estimated body mass index is 25.65 kg/m as calculated from the following:   Height as of this encounter: 5' 2 (1.575 m).   Weight as of this encounter: 63.6 kg. Weight Loss and Dietary Counseling given   DVT prophylaxis: Place and maintain sequential compression device Start: 09/26/23 0130 enoxaparin  (LOVENOX ) injection 40 mg Start: 09/25/23 2200    Code Status: Limited: Do not attempt resuscitation (DNR) -DNR-LIMITED -Do Not Intubate/DNI  Family Communication: No family currently at bedside  Disposition Plan:  Level of care: Stepdown Status is: Inpatient Remains inpatient appropriate because: Needs further clinical improvement and clearance by the specialists   Consultants:  Pulmonary  Procedures:  As delineated as above  Antimicrobials:  Anti-infectives (From admission, onward)    Start     Dose/Rate Route Frequency Ordered Stop   09/27/23 1100  metroNIDAZOLE  (FLAGYL ) IVPB 500 mg        500 mg 100 mL/hr over 60 Minutes Intravenous 2 times daily 09/27/23 1013     09/26/23 0800  cefTRIAXone  (ROCEPHIN ) 2 g in sodium chloride  0.9 % 100 mL IVPB        2 g 200 mL/hr over 30 Minutes Intravenous Every 24 hours 09/25/23 1318 09/30/23 0759   09/26/23 0800  azithromycin  (ZITHROMAX ) 500 mg in sodium chloride  0.9 % 250 mL IVPB  Status:  Discontinued        500 mg 250 mL/hr over 60 Minutes Intravenous Every 24 hours 09/25/23 1318 09/27/23 1013   09/25/23 0830  cefTRIAXone  (ROCEPHIN ) 1 g in sodium chloride  0.9 % 100 mL IVPB        1 g 200 mL/hr over 30 Minutes Intravenous  Once 09/25/23 0824 09/25/23 1002   09/25/23 0830  azithromycin   (ZITHROMAX ) 500 mg in sodium chloride  0.9 % 250 mL IVPB        500 mg 250 mL/hr over 60 Minutes Intravenous  Once 09/25/23 0824 09/25/23 1042       Subjective: Examined at bedside and states that she feels terrible.  Nurses witnessed the patient aspirating this morning so she was made n.p.o. and underwent MBS which show she has a loss of esophageal reflux retrograde movement of the barium.  She is now on dysphagia 3 diet with nectar thick liquids.  States that she is coughing up.  No other concerns or complaints at this time.  Objective: Vitals:   09/27/23 1640 09/27/23 1700 09/27/23 1800 09/27/23 1914  BP:  (!) 123/29 139/71   Pulse:  63 61   Resp:  20 20   Temp: 98.5 F (36.9 C)   97.6 F (36.4 C)  TempSrc: Oral   Oral  SpO2:  98% 98%   Weight:      Height:        Intake/Output Summary (Last 24 hours) at 09/27/2023 1915 Last data filed at 09/27/2023 1700 Gross per 24 hour  Intake --  Output 1800 ml  Net -1800 ml   Filed Weights   09/25/23 0634 09/25/23 1348  Weight: 58.5 kg 63.6 kg   Examination: Physical Exam:  Constitutional: WN/WD overweight elderly Caucasian female who appears a little uncomfortable Respiratory: Diminished to auscultation bilaterally with some coarse breath sounds and has some rhonchi and some crackles.  No appreciable wheezing or rales.  Has a slight increased respiratory effort but is not wearing any supplemental oxygen via nasal cannula or using accessory muscles Cardiovascular: Tachycardic, no murmurs / rubs / gallops. S1 and S2 auscultated.  Trace extremity edema Abdomen: Soft, non-tender, slightly distended secondary to body habitus. Bowel sounds positive.  GU: Deferred. Musculoskeletal: No clubbing / cyanosis of digits/nails. No joint deformity upper and lower extremities. Skin: No rashes, lesions, ulcers limited skin evaluation. No induration; Warm and dry.  Neurologic: CN 2-12 grossly intact with no focal deficits but she is very hard of  hearing and only really hears out of her left ear.  Romberg sign and cerebellar reflexes not assessed. Psychiatric: Anxious appearing but she is awake and alert  Data Reviewed: I have personally reviewed following labs and imaging studies  CBC: Recent Labs  Lab 09/25/23 0625 09/26/23 0250 09/27/23 0238  WBC 11.8* 10.4 10.8*  NEUTROABS  --   --  7.4  HGB 11.2* 9.8* 9.2*  HCT 34.7* 29.4* 28.7*  MCV 99.1 95.8 99.7  PLT 365 348 310   Basic Metabolic Panel: Recent Labs  Lab 09/25/23 0625 09/26/23 0250 09/27/23 0238  NA 127* 127* 127*  K 4.1 5.0 4.4  CL 96* 96* 96*  CO2 22 21* 21*  GLUCOSE 160* 148* 97  BUN 18 24* 26*  CREATININE 0.55 0.73 0.76  CALCIUM 8.6* 8.7* 8.5*  MG  --   --  1.8  PHOS  --   --  3.1   GFR: Estimated Creatinine Clearance: 41.8 mL/min (by C-G formula based on SCr of 0.76 mg/dL). Liver Function Tests: Recent Labs  Lab 09/26/23 0250 09/27/23 0238  AST 23 28  ALT 15 15  ALKPHOS 61 52  BILITOT 0.6 0.5  PROT 6.4* 6.2*  ALBUMIN 3.3* 3.3*   No results for input(s): LIPASE, AMYLASE in the last 168 hours. No results for input(s): AMMONIA in the last 168 hours. Coagulation Profile: No results for input(s): INR, PROTIME in the last 168 hours. Cardiac Enzymes: No results for input(s): CKTOTAL, CKMB, CKMBINDEX, TROPONINI in the last 168 hours. BNP (last 3 results) No results for input(s): PROBNP in the last 8760 hours. HbA1C: Recent Labs    09/25/23 1716  HGBA1C 6.3*   CBG: Recent Labs  Lab 09/26/23 1614 09/26/23 2124 09/27/23 0726 09/27/23 1317 09/27/23 1644  GLUCAP 126* 92 100* 120* 118*   Lipid Profile: No results for input(s): CHOL, HDL, LDLCALC, TRIG, CHOLHDL, LDLDIRECT in the last 72 hours. Thyroid  Function Tests: No results for input(s): TSH, T4TOTAL, FREET4, T3FREE, THYROIDAB in the last 72 hours. Anemia Panel: No results for input(s): VITAMINB12, FOLATE, FERRITIN, TIBC, IRON,  RETICCTPCT in the last 72 hours. Sepsis Labs: Recent Labs  Lab 09/25/23 1716 09/25/23 2114 09/26/23 0840 09/26/23 0946  PROCALCITON  --   --  <0.10  --   LATICACIDVEN 3.2* 2.0* 0.9 1.1    Recent Results (from the past 240 hours)  Resp panel by RT-PCR (RSV, Flu A&B, Covid) Anterior Nasal Swab     Status: None   Collection Time: 09/25/23  7:00 AM   Specimen: Anterior Nasal Swab  Result Value Ref Range Status   SARS Coronavirus 2 by RT PCR NEGATIVE NEGATIVE Final    Comment: (NOTE) SARS-CoV-2 target nucleic acids are NOT DETECTED.  The SARS-CoV-2 RNA is generally detectable in upper respiratory specimens during the acute phase of infection. The lowest concentration of SARS-CoV-2 viral copies this assay can detect is 138 copies/mL. A negative result does not preclude SARS-Cov-2 infection and should not be used as the sole basis for treatment or other patient management decisions. A negative result may occur with  improper specimen collection/handling, submission of specimen other than nasopharyngeal swab, presence of viral mutation(s) within the areas targeted by this assay, and inadequate number of viral copies(<138 copies/mL). A negative result must be combined with clinical observations, patient history, and epidemiological information. The expected result is Negative.  Fact Sheet for Patients:  BloggerCourse.com  Fact Sheet for Healthcare Providers:  SeriousBroker.it  This test is no t yet approved or cleared by the [place]  FDA and  has been authorized for detection and/or diagnosis of SARS-CoV-2 by FDA under an Emergency Use Authorization (EUA). This EUA will remain  in effect (meaning this test can be used) for the duration of the COVID-19 declaration under Section 564(b)(1) of the Act, 21 U.S.C.section 360bbb-3(b)(1), unless the authorization is terminated  or revoked sooner.       Influenza A by PCR  NEGATIVE NEGATIVE Final   Influenza B by PCR NEGATIVE NEGATIVE Final    Comment: (NOTE) The Xpert Xpress SARS-CoV-2/FLU/RSV plus assay is intended as an aid in the diagnosis of influenza from Nasopharyngeal swab specimens and should not be used as a sole basis for treatment. Nasal washings and aspirates are unacceptable for Xpert Xpress SARS-CoV-2/FLU/RSV testing.  Fact Sheet for Patients: BloggerCourse.com  Fact Sheet for Healthcare Providers: SeriousBroker.it  This test is not yet approved or cleared by the [place]  FDA and has been authorized for detection and/or diagnosis of SARS-CoV-2 by FDA under an Emergency Use Authorization (EUA). This EUA will remain in effect (meaning this test can be used) for the duration of the COVID-19 declaration under Section 564(b)(1) of the Act, 21 U.S.C. section 360bbb-3(b)(1), unless the authorization is terminated or revoked.     Resp Syncytial Virus by PCR NEGATIVE NEGATIVE Final    Comment: (NOTE) Fact Sheet for Patients: BloggerCourse.com  Fact Sheet for Healthcare Providers: SeriousBroker.it  This test is not yet approved or cleared by the [place]  FDA and has been authorized for detection and/or diagnosis of SARS-CoV-2 by FDA under an Emergency Use Authorization (EUA). This EUA will remain in effect (meaning this test can be used) for the duration of the COVID-19 declaration under Section 564(b)(1) of the Act, 21 U.S.C. section 360bbb-3(b)(1), unless the authorization is terminated or revoked.  Performed at [facility], [address], [place], [place] [address]   Blood culture (routine x 2)     Status: None (Preliminary result)   Collection Time: 09/25/23  8:25 AM   Specimen: BLOOD  Result Value Ref Range Status   Specimen Description   Final    BLOOD LEFT ANTECUBITAL Performed at [facility], [address], [place], [place] [address]    Special Requests   Final    BOTTLES DRAWN AEROBIC AND ANAEROBIC Blood Culture results may not be optimal due to an inadequate volume of blood received in culture bottles Performed at [facility], [address], [place], [place] [address]    Culture   Final  NO GROWTH 2 DAYS Performed at [facility], [address] [place], [place] [address]    Report Status PENDING  Incomplete  Blood culture (routine x 2)     Status: None (Preliminary result)   Collection Time: 09/25/23  8:35 AM   Specimen: BLOOD  Result Value Ref Range Status   Specimen Description   Final    BLOOD LEFT ANTECUBITAL Performed at [facility], [address] [place], [place] [address]    Special Requests   Final    BOTTLES DRAWN AEROBIC AND ANAEROBIC Blood Culture results may not be optimal due to an inadequate volume of blood received in culture bottles Performed at [facility], [address] [place], [place] [address]    Culture   Final    NO GROWTH 2 DAYS Performed at [facility], [address] [place], [place] [address]    Report Status PENDING  Incomplete  MRSA Next Gen by PCR, Nasal     Status: None   Collection Time: 09/25/23  1:43 PM   Specimen: Nasal Mucosa; Nasal Swab  Result Value Ref Range Status   MRSA by PCR Next Gen NOT DETECTED NOT DETECTED Final    Comment: (NOTE) The GeneXpert MRSA Assay (FDA approved for NASAL specimens only), is one component of a comprehensive MRSA colonization surveillance program. It is not intended to diagnose MRSA infection nor to guide or monitor treatment for MRSA infections. Test performance is not FDA approved in patients less than 88 years old. Performed at [facility], [address] [place], [place] [address]     Radiology Studies: DG Swallowing Func-Speech Pathology Result Date:  09/27/2023 Table formatting from the original result was not included. Modified Barium Swallow Study Patient Details Name: LEEBA BARBE MRN: [id number] Date of Birth: [birth year] Today's Date: 09/27/2023 HPI/PMH: HPI: History of anxiety, depression, osteoarthritis, COPD, type 2 diabetes, hypertension, UTI  Woke up from sleep around 4 AM with hemoptysis, EMS found with saturations in the 60s, placed on nasal cannula was given bronchodilators. CT scan of the chest was obtained- Right infrahilar consolidation, possible mass lesion. Has been observed to cough with drinking. Clinical Impression: Clinical Impression: Patient presents with an oropharyngeal dysphagia as per this MBS but what is more concerning is her esophageal phase of swallowing with retrograde movement of majority of barium given up to level of thoracic esophagus but staying below PES, prominent cricopharyngael bar which resulted in delays in barium transit. Her esophagus appeared to be filled with air/gas and she did exhibit instances of belching during study. Swallow initiated at level of pyriform sinus with thin liquids and vallecular sinus with solids and honey thick liquids. Swallow initiation was delayed at level of the posterior laryngeal surface of the epiglottis with nectar thick liquids. Penetration to the vocal cords that did not consistently clear out (PAS 3, 5) occured with thin liquids but no aspiration observed. No penetration or aspiration observed with nectar or honey thick liquids or puree, mechanical soft solids. Pharyngeal residuals were more prevalent with thin and nectar thick liquids than with honey thick liquids or solids. Patient will be at a high risk of post-prandial aspiration secondary to the significant s/s of esophageal dysphagia observed during this MBS. The safest PO consistency to decrease aspiration risk is dysphagia 3 solids (mechanical soft) and nectar thick liquids. Patient may have plain/thin water  in between meals.  SLP will follow for toleration. Factors that may increase risk  of adverse event in presence of aspiration Roderick Civatte & Jessy Morocco 2021): Factors that may increase risk of adverse event in presence of aspiration Roderick Civatte & Jessy Morocco 2021): Limited mobility; Frail or deconditioned Recommendations/Plan: Swallowing Evaluation Recommendations Swallowing Evaluation Recommendations Recommendations: PO diet PO Diet Recommendation: Dysphagia 3 (Mechanical soft); Mildly thick liquids (Level 2, nectar thick) Liquid Administration via: Cup; Straw Medication Administration: Crushed with puree Supervision: Staff to assist with self-feeding; Full supervision/cueing for swallowing strategies Swallowing strategies  : Slow rate; Small bites/sips Postural changes: Stay upright 30-60 min after meals; Position pt fully upright for meals Oral care recommendations: Oral care BID (2x/day) Treatment Plan Treatment Plan Treatment recommendations: Therapy as outlined in treatment plan below Follow-up recommendations: Skilled nursing-short term rehab (<3 hours/day) Functional status assessment: Patient has had a recent decline in their functional status and demonstrates the ability to make significant improvements in function in a reasonable and predictable amount of time. Treatment frequency: Min 2x/week Treatment duration: 1 week Interventions: Aspiration precaution training; Diet toleration management by SLP; Patient/family education; Trials of upgraded texture/liquids Recommendations Recommendations for follow up therapy are one component of a multi-disciplinary discharge planning process, led by the attending physician.  Recommendations may be updated based on patient status, additional functional criteria and insurance authorization. Assessment: Orofacial Exam: Orofacial Exam Oral Cavity: Oral Hygiene: WFL Oral Cavity - Dentition: Adequate natural dentition Orofacial Anatomy: WFL Oral Motor/Sensory Function: WFL Anatomy: Anatomy: Prominent  cricopharyngeus Boluses Administered: Boluses Administered Boluses Administered: Thin liquids (Level 0); Mildly thick liquids (Level 2, nectar thick); Moderately thick liquids (Level 3, honey thick); Puree; Solid  Oral Impairment Domain: Oral Impairment Domain Lip Closure: No labial escape Tongue control during bolus hold: Not tested Bolus preparation/mastication: Timely and efficient chewing and mashing Bolus transport/lingual motion: Brisk tongue motion Oral residue: Complete oral clearance Initiation of pharyngeal swallow : Valleculae; Posterior laryngeal surface of the epiglottis; Pyriform sinuses  Pharyngeal Impairment Domain: Pharyngeal Impairment Domain Soft palate elevation: No bolus between soft palate (SP)/pharyngeal wall (PW) Laryngeal elevation: Complete superior movement of thyroid  cartilage with complete approximation of arytenoids to epiglottic petiole Anterior hyoid excursion: Partial anterior movement Epiglottic movement: Complete inversion Laryngeal vestibule closure: Incomplete, narrow column air/contrast in laryngeal vestibule Pharyngeal stripping wave : Present - complete Pharyngeal contraction (A/P view only): N/A Pharyngoesophageal segment opening: Complete distension and complete duration, no obstruction of flow Tongue base retraction: Trace column of contrast or air between tongue base and PPW Pharyngeal residue: Collection of residue within or on pharyngeal structures Location of pharyngeal residue: Valleculae; Pharyngeal wall; Pyriform sinuses  Esophageal Impairment Domain: Esophageal Impairment Domain Esophageal clearance upright position: Esophageal retention with retrograde flow below pharyngoesophageal segment (PES) Pill: No data recorded Penetration/Aspiration Scale Score: Penetration/Aspiration Scale Score 1.  Material does not enter airway: Moderately thick liquids (Level 3, honey thick); Puree; Solid; Mildly thick liquids (Level 2, nectar thick) 3.  Material enters airway, remains  ABOVE vocal cords and not ejected out: Thin liquids (Level 0) 5.  Material enters airway, CONTACTS cords and not ejected out: Thin liquids (Level 0) Compensatory Strategies: Compensatory Strategies Compensatory strategies: No   General Information: Caregiver present: No  Diet Prior to this Study: NPO   No data recorded  Respiratory Status: WFL   Supplemental O2: Nasal cannula   History of Recent Intubation: No  Behavior/Cognition: Alert; Cooperative; Pleasant mood Self-Feeding Abilities: Needs assist with self-feeding Baseline vocal quality/speech: Normal No data recorded Volitional Swallow: Able to elicit Exam Limitations: No limitations Goal Planning: Prognosis for improved oropharyngeal function: Fair  Barriers to Reach Goals: Severity of deficits; Time post onset No data recorded No data recorded Consulted and agree with results and recommendations: Pt unable/family or caregiver not available; Physician Pain: Pain Assessment Pain Assessment: No/denies pain Pain Score: 0 Pain Location: [place]  rubbed on knees as pt. reports pain  but then denied that there was pain. End of Session: Start Time:SLP Start Time (ACUTE ONLY): 1250 Stop Time: SLP Stop Time (ACUTE ONLY): 1310 Time Calculation:SLP Time Calculation (min) (ACUTE ONLY): 20 min Charges: SLP Evaluations $ SLP Speech Visit: 1 Visit SLP Evaluations $BSS Swallow: 1 Procedure $MBS Swallow: 1 Procedure SLP visit diagnosis: SLP Visit Diagnosis: Dysphagia, pharyngoesophageal phase (R13.14); Dysphagia, oropharyngeal phase (R13.12) Past Medical History: Past Medical History: Diagnosis Date  Anxiety   Arthritis   Atrophic vaginitis   Cataract   COPD (chronic obstructive pulmonary disease) (HCC)   Depressive disorder, not elsewhere classified   Diabetes mellitus without complication (HCC)   Disorder of bone and cartilage, unspecified   Elevated blood sugar   Emphysema lung (HCC)   Hypertension   Other and unspecified hyperlipidemia   Postmenopausal   Rhinitis  Past  Surgical History: Past Surgical History: Procedure Laterality Date  APPENDECTOMY  1957  CATARACT EXTRACTION W/ INTRAOCULAR LENS IMPLANT Bilateral 2005  [place]  COLONOSCOPY N/A 03/08/2014  Procedure: COLONOSCOPY;  Surgeon: Ruby Corporal, MD;  Location: [facility];  Service: Endoscopy;  Laterality: N/A;  EYE SURGERY    JOINT REPLACEMENT    TOTAL HIP ARTHROPLASTY Left 12/27/2013  Procedure: LEFT TOTAL HIP ARTHROPLASTY;  Surgeon: Timothy Ford, MD;  Location: [facility] OR;  Service: Orthopedics;  Laterality: Left; Jacqualine Mater, MA, CCC-SLP Speech Therapy   DG CHEST PORT 1 VIEW Result Date: 09/27/2023 CLINICAL DATA:  Shortness of breath. EXAM: PORTABLE CHEST 1 VIEW COMPARISON:  09/25/2023 FINDINGS: The cardio pericardial silhouette is enlarged. Interstitial markings are diffusely coarsened with chronic features. Patchy opacity in the medial right base compatible with airspace disease seen in this region on CT scan 2 days ago. No substantial pleural effusion. No acute bony abnormality. Telemetry leads overlie the chest. IMPRESSION: Patchy opacity in the medial right base compatible with airspace disease seen in this region on CT scan 2 days ago. Electronically Signed   By: Donnal Fusi M.D.   On: 09/27/2023 09:12   ECHOCARDIOGRAM COMPLETE Result Date: 09/26/2023    ECHOCARDIOGRAM REPORT   Patient Name:   TAYLIE HELDER Date of Exam: 09/26/2023 Medical Rec #:  [id number]       Height:       62.0 in Accession #:    [id number]      Weight:       140.2 lb Date of Birth:  1931-08-08       BSA:          1.644 m Patient Age:    89 years        BP:           115/37 mmHg Patient Gender: F               HR:           64 bpm. Exam Location:  Inpatient Procedure: 2D Echo, Color Doppler, Cardiac Doppler and Intracardiac            Opacification Agent (Both Spectral and Color Flow Doppler were            utilized during procedure). Indications:    Abnormal ECG  CAD Native Vessel  History:        Patient has no prior  history of Echocardiogram examinations.                 COPD; Risk Factors:Hypertension and Diabetes.  Sonographer:    Travis Friedman RDCS Referring Phys: [id number] DAVID MANUEL ORTIZ IMPRESSIONS  1. Left ventricular ejection fraction, by estimation, is 50 to 55%. The left ventricle has low normal function. The left ventricle has no regional wall motion abnormalities. Left ventricular diastolic parameters are indeterminate.  2. Right ventricular systolic function is normal. The right ventricular size is normal. There is mildly elevated pulmonary artery systolic pressure. The estimated right ventricular systolic pressure is 45.0 mmHg.  3. Right atrial size was moderately dilated.  4. The mitral valve is normal in structure. Trivial mitral valve regurgitation. No evidence of mitral stenosis.  5. Tricuspid valve regurgitation is mild to moderate.  6. The aortic valve is tricuspid. Aortic valve regurgitation is trivial. Aortic valve sclerosis/calcification is present, without any evidence of aortic stenosis.  7. The inferior vena cava is dilated in size with >50% respiratory variability, suggesting right atrial pressure of 8 mmHg. FINDINGS  Left Ventricle: Left ventricular ejection fraction, by estimation, is 50 to 55%. The left ventricle has low normal function. The left ventricle has no regional wall motion abnormalities. The left ventricular internal cavity size was normal in size. There is no left ventricular hypertrophy. Left ventricular diastolic parameters are indeterminate. Right Ventricle: The right ventricular size is normal. No increase in right ventricular wall thickness. Right ventricular systolic function is normal. There is mildly elevated pulmonary artery systolic pressure. The tricuspid regurgitant velocity is 3.04  m/s, and with an assumed right atrial pressure of 8 mmHg, the estimated right ventricular systolic pressure is 45.0 mmHg. Left Atrium: Left atrial size was normal in size. Right Atrium: Right  atrial size was moderately dilated. Pericardium: There is no evidence of pericardial effusion. Mitral Valve: The mitral valve is normal in structure. Trivial mitral valve regurgitation. No evidence of mitral valve stenosis. Tricuspid Valve: The tricuspid valve is normal in structure. Tricuspid valve regurgitation is mild to moderate. Aortic Valve: The aortic valve is tricuspid. Aortic valve regurgitation is trivial. Aortic valve sclerosis/calcification is present, without any evidence of aortic stenosis. Aortic valve mean gradient measures 3.0 mmHg. Aortic valve peak gradient measures 5.0 mmHg. Aortic valve area, by VTI measures 2.04 cm. Pulmonic Valve: The pulmonic valve was not well visualized. Pulmonic valve regurgitation is not visualized. Aorta: The aortic root and ascending aorta are structurally normal, with no evidence of dilitation. Venous: The inferior vena cava is dilated in size with greater than 50% respiratory variability, suggesting right atrial pressure of 8 mmHg. IAS/Shunts: The interatrial septum was not well visualized.  LEFT VENTRICLE PLAX 2D LVIDd:         4.10 cm     Diastology LVIDs:         3.00 cm     LV e' medial:    8.05 cm/s LV PW:         1.00 cm     LV E/e' medial:  10.4 LV IVS:        1.10 cm     LV e' lateral:   6.96 cm/s LVOT diam:     1.90 cm     LV E/e' lateral: 12.0 LV SV:         51 LV SV Index:   31 LVOT Area:  2.84 cm  LV Volumes (MOD) LV vol d, MOD A2C: 92.9 ml LV vol d, MOD A4C: 98.8 ml LV vol s, MOD A2C: 41.4 ml LV vol s, MOD A4C: 45.6 ml LV SV MOD A2C:     51.5 ml LV SV MOD A4C:     98.8 ml LV SV MOD BP:      54.6 ml RIGHT VENTRICLE             IVC RV S prime:     10.70 cm/s  IVC diam: 2.10 cm TAPSE (M-mode): 2.6 cm LEFT ATRIUM             Index        RIGHT ATRIUM           Index LA diam:        4.00 cm 2.43 cm/m   RA Area:     22.70 cm LA Vol (A2C):   61.4 ml 37.35 ml/m  RA Volume:   76.00 ml  46.23 ml/m LA Vol (A4C):   31.6 ml 19.22 ml/m LA Biplane Vol: 45.3 ml  27.56 ml/m  AORTIC VALVE AV Area (Vmax):    2.13 cm AV Area (Vmean):   2.02 cm AV Area (VTI):     2.04 cm AV Vmax:           112.00 cm/s AV Vmean:          78.500 cm/s AV VTI:            0.250 m AV Peak Grad:      5.0 mmHg AV Mean Grad:      3.0 mmHg LVOT Vmax:         84.20 cm/s LVOT Vmean:        55.800 cm/s LVOT VTI:          0.180 m LVOT/AV VTI ratio: 0.72  AORTA Ao Root diam: 2.90 cm Ao Asc diam:  2.80 cm MITRAL VALVE               TRICUSPID VALVE MV Area (PHT): 4.06 cm    TR Peak grad:   37.0 mmHg MV Decel Time: 187 msec    TR Vmax:        304.00 cm/s MV E velocity: 83.70 cm/s MV A velocity: 42.70 cm/s  SHUNTS MV E/A ratio:  1.96        Systemic VTI:  0.18 m                            Systemic Diam: 1.90 cm Carson Clara MD Electronically signed by Carson Clara MD Signature Date/Time: 09/26/2023/2:02:41 PM    Final    Scheduled Meds:  arformoterol  15 mcg Nebulization Q12H   atenolol   25 mg Oral BID   budesonide  (PULMICORT ) nebulizer solution  0.5 mg Nebulization BID   calcium carbonate  500 mg of elemental calcium Oral Q breakfast   Chlorhexidine  Gluconate Cloth  6 each Topical Daily   cholecalciferol  2,000 Units Oral Daily   diclofenac  Sodium  2 g Topical QID   enoxaparin  (LOVENOX ) injection  40 mg Subcutaneous Q24H   escitalopram   20 mg Oral Daily   gabapentin   200 mg Oral TID   insulin  aspart  0-15 Units Subcutaneous TID WC   losartan   25 mg Oral Daily   melatonin  5 mg Oral QHS   pantoprazole (PROTONIX) IV  40 mg Intravenous Q12H   pravastatin   40 mg Oral q1800   tamsulosin   0.4 mg Oral Daily   tranexamic acid  500 mg Nebulization Q8H   Continuous Infusions:  cefTRIAXone  (ROCEPHIN )  IV Stopped (09/27/23 0820)   metronidazole  Stopped (09/27/23 1122)    LOS: 2 days   Aura Leeds, DO [facility] Available via Epic secure chat 7am-7pm After these hours, please refer to coverage provider listed on [email or web address] 09/27/2023, 7:15 PM

## 2023-09-27 NOTE — Evaluation (Signed)
 Occupational Therapy Evaluation Patient Details Name: [name] [name] MRN: [id number] DOB: Nov 09, 1931 Today's Date: 09/27/2023   History of Present Illness   Patient is a 89 year old female who presented on 6/14 with O2 saturation in 60s. Patient was admitted with sepsis, COPD exacerbation, and acute respiratory failure with hypoxia. KGM:WNUUVOZ, depression, osteoarthritis, atrophic vaginitis, cataracts, COPD, type 2 diabetes, hypertension, history of rhabdomyolysis, transaminitis, history of UTI     Clinical Impressions Patient is a 89 year old female who was admitted for above. Patient was living at [facility] since last hospitalization in mid April. Patient was +2 to move with STEDY to transition to recliner in room with patient having wind swept positioning of legs at rest. Patient was noted to have decreased functional activity tolernace, decreased ROM, decreased BUE strength, decreased endurance, decreased sitting balance, decreased standing balanced, decreased safety awareness, and decreased knowledge of AE/AD impacting participation in ADLs.  Patients blood pressure was 114/43 mmhg after movement with nurse made aware. Nurse reported patient had recently had her BP meds prior to session. Patient to transition back to SNF when medically stable.      If plan is discharge home, recommend the following:   Two people to help with walking and/or transfers;A lot of help with bathing/dressing/bathroom;Assistance with cooking/housework;Direct supervision/assist for medications management;Assist for transportation;Help with stairs or ramp for entrance;Direct supervision/assist for financial management     Functional Status Assessment   Patient has had a recent decline in their functional status and demonstrates the ability to make significant improvements in function in a reasonable and predictable amount of time.     Equipment Recommendations   None recommended by OT       Precautions/Restrictions   Precautions Precautions: Fall Recall of Precautions/Restrictions: Impaired Precaution/Restrictions Comments: hears best out of L ear Restrictions Weight Bearing Restrictions Per Provider Order: No     Mobility Bed Mobility Overal bed mobility: Needs Assistance Bed Mobility: Supine to Sit     Supine to sit: +2 for physical assistance, +2 for safety/equipment, HOB elevated             Balance Overall balance assessment: Needs assistance Sitting-balance support: Bilateral upper extremity supported, Feet unsupported Sitting balance-Leahy Scale: Poor     Standing balance support: Reliant on assistive device for balance, Bilateral upper extremity supported Standing balance-Leahy Scale: Zero         ADL either performed or assessed with clinical judgement   ADL Overall ADL's : Needs assistance/impaired Eating/Feeding: Supervision/ safety;Set up;Sitting Eating/Feeding Details (indicate cue type and reason): tremors with movement but able to take sips with no spillage Grooming: Sitting;Minimal assistance   Upper Body Bathing: Sitting;Minimal assistance   Lower Body Bathing: Bed level;Total assistance   Upper Body Dressing : Sitting;Minimal assistance   Lower Body Dressing: Bed level;Total assistance   Toilet Transfer: Total assistance;+2 for physical assistance Toilet Transfer Details (indicate cue type and reason): STEDY to transfer Toileting- Clothing Manipulation and Hygiene: Sit to/from stand;Total assistance               Vision Baseline Vision/History: 1 Wears glasses Additional Comments: esotropic eye positioning at times during session bilaterally.            Pertinent Vitals/Pain Pain Assessment Pain Assessment: No/denies pain     Extremity/Trunk Assessment Upper Extremity Assessment Upper Extremity Assessment: Overall WFL for tasks assessed   Lower Extremity Assessment Lower Extremity Assessment:  (windswept  leg positioning in bed and with movement attempts.)   Cervical /  Trunk Assessment Cervical / Trunk Assessment: Other exceptions Cervical / Trunk Exceptions: noted to have uncontrolled movement of jaw during session. patient indicated it was normal for her.   Communication Communication Communication: Impaired Factors Affecting Communication: Hearing impaired;Other (comment)   Cognition Arousal: Alert Behavior During Therapy: Flat affect Cognition: Cognition impaired     Awareness: Intellectual awareness impaired, Online awareness impaired   Attention impairment (select first level of impairment): Sustained attention Executive functioning impairment (select all impairments): Sequencing, Reasoning, Problem solving, Organization OT - Cognition Comments: knew she was in [facility] hospital with knowledge of date except for year which indicated 2050 and then 2055 when asked again                 Following commands: Impaired Following commands impaired: Follows one step commands inconsistently                Home Living Family/patient expects to be discharged to:: Skilled nursing facility            Prior Functioning/Environment Prior Level of Function : Needs assist             Mobility Comments: reported she was unable to walk ADLs Comments: chart indicated patient was living at [facility] after hospitalization about two months ago.    OT Problem List: Impaired balance (sitting and/or standing);Decreased knowledge of precautions;Cardiopulmonary status limiting activity;Decreased safety awareness;Decreased coordination;Decreased knowledge of use of DME or AE;Decreased activity tolerance   OT Treatment/Interventions: Self-care/ADL training;DME and/or AE instruction;Therapeutic activities;Balance training;Therapeutic exercise;Energy conservation;Patient/family education      OT Goals(Current goals can be found in the care plan section)   Acute Rehab OT  Goals Patient Stated Goal: none stated OT Goal Formulation: Patient unable to participate in goal setting Time For Goal Achievement: 10/11/23 Potential to Achieve Goals: Fair   OT Frequency:  Min 2X/week    Co-evaluation PT/OT/SLP Co-Evaluation/Treatment: Yes Reason for Co-Treatment: To address functional/ADL transfers PT goals addressed during session: Mobility/safety with mobility OT goals addressed during session: ADL's and self-care      AM-PAC OT 6 Clicks Daily Activity     Outcome Measure Help from another person eating meals?: A Little Help from another person taking care of personal grooming?: A Little Help from another person toileting, which includes using toliet, bedpan, or urinal?: Total Help from another person bathing (including washing, rinsing, drying)?: Total Help from another person to put on and taking off regular upper body clothing?: A Little Help from another person to put on and taking off regular lower body clothing?: Total 6 Click Score: 12   End of Session Equipment Utilized During Treatment: Gait belt;Other (comment) (STEDY) Nurse Communication: Mobility status;Other (comment) (BP wit movement)  Activity Tolerance: Patient limited by fatigue Patient left: in chair;with call bell/phone within reach;with chair alarm set  OT Visit Diagnosis: Unsteadiness on feet (R26.81);Other abnormalities of gait and mobility (R26.89);History of falling (Z91.81)                Time: 1610-9604 OT Time Calculation (min): 20 min Charges:  OT General Charges $OT Visit: 1 Visit OT Evaluation $OT Eval Moderate Complexity: 1 Mod  Koen Antilla OTR/L, MS Acute Rehabilitation Department Office# [phone]   Jame Maze 09/27/2023, 10:45 AM

## 2023-09-27 NOTE — Procedures (Signed)
 Modified Barium Swallow Study  Patient Details  Name: [name] [name] MRN: [id number] Date of Birth: 09-05-1931  Today's Date: 09/27/2023  Modified Barium Swallow completed.  Full report located under Chart Review in the Imaging Section.  History of Present Illness History of anxiety, depression, osteoarthritis, COPD, type 2 diabetes, hypertension, UTI  Woke up from sleep around 4 AM with hemoptysis, EMS found with saturations in the 60s, placed on nasal cannula was given bronchodilators. CT scan of the chest was obtained- Right infrahilar consolidation, possible mass lesion. Has been observed to cough with drinking.   Clinical Impression Patient presents with an oropharyngeal dysphagia as per this MBS but what is more concerning is her esophageal phase of swallowing with retrograde movement of majority of barium given up to level of thoracic esophagus but staying below PES, prominent cricopharyngael bar which resulted in delays in barium transit. Her esophagus appeared to be filled with air/gas and she did exhibit instances of belching during study. Swallow initiated at level of pyriform sinus with thin liquids and vallecular sinus with solids and honey thick liquids. Swallow initiation was delayed at level of the posterior laryngeal surface of the epiglottis with nectar thick liquids. Penetration to the vocal cords that did not consistently clear out (PAS 3, 5) occured with thin liquids but no aspiration observed. No penetration or aspiration observed with nectar or honey thick liquids or puree, mechanical soft solids. Pharyngeal residuals were more prevalent with thin and nectar thick liquids than with honey thick liquids or solids. Patient will be at a high risk of post-prandial aspiration secondary to the significant s/s of esophageal dysphagia observed during this MBS. The safest PO consistency to decrease aspiration risk is dysphagia 3 solids (mechanical soft) and nectar thick liquids. Patient  may have plain/thin water  in between meals. SLP will follow for toleration. Factors that may increase risk of adverse event in presence of aspiration Roderick Civatte & Jessy Morocco 2021): Limited mobility;Frail or deconditioned  Swallow Evaluation Recommendations Recommendations: PO diet PO Diet Recommendation: Dysphagia 3 (Mechanical soft);Mildly thick liquids (Level 2, nectar thick) Liquid Administration via: Cup;Straw Medication Administration: Crushed with puree Supervision: Staff to assist with self-feeding;Full supervision/cueing for swallowing strategies Swallowing strategies  : Slow rate;Small bites/sips Postural changes: Stay upright 30-60 min after meals;Position pt fully upright for meals Oral care recommendations: Oral care BID (2x/day)     Jacqualine Mater, MA, CCC-SLP Speech Therapy

## 2023-09-27 NOTE — Evaluation (Signed)
 Physical Therapy Evaluation Patient Details Name: [name] [name] MRN: [id number] DOB: 23-Jul-1931 Today's Date: 09/27/2023  History of Present Illness  Patient is a 89 year old female who presented on 6/14 with O2 saturation in 60s. Patient was admitted with sepsis, COPD exacerbation, and acute respiratory failure with hypoxia. NWG:NFAOZHY, depression, osteoarthritis, atrophic vaginitis, cataracts, COPD, type 2 diabetes, hypertension, history of rhabdomyolysis, transaminitis, history of UTI  Clinical Impression  Pt admitted with above diagnosis.  Pt currently with functional limitations due to the deficits listed below (see PT Problem List). Pt will benefit from acute skilled PT to increase their independence and safety with mobility to allow discharge.     Appears that patient comes from SNF since 4/25. Patient  oriented to place and date and has PNA. Patient reports that she does not ambulate, vague  about how she gets around.  Patient was assisted to transfer to recliner using STEDY lift equipment as patient was able to stand.  Patient with severe L knee genu valgus deformity.  Recommend return to SNF.  Patient remained on RA with SPO2 >94%. BP  did drop   108/50, then 114/43. RN aware.      If plan is discharge home, recommend the following: Two people to help with walking and/or transfers;A lot of help with bathing/dressing/bathroom;Assist for transportation   Can travel by private vehicle   No    Equipment Recommendations None recommended by PT  Recommendations for Other Services       Functional Status Assessment Patient has had a recent decline in their functional status and/or demonstrates limited ability to make significant improvements in function in a reasonable and predictable amount of time     Precautions / Restrictions Precautions Precautions: Fall Recall of Precautions/Restrictions: Impaired Precaution/Restrictions Comments: hears best out of L  ear Restrictions Weight Bearing Restrictions Per Provider Order: No      Mobility  Bed Mobility   Bed Mobility: Supine to Sit     Supine to sit: +2 for physical assistance, +2 for safety/equipment, HOB elevated     General bed mobility comments: patient able to move legs towards bed edge and sit upright, amod assistance to completely sit, able to self assist to scoot forward    Transfers Overall transfer level: Needs assistance   Transfers: Sit to/from Stand, Bed to chair/wheelchair/BSC Sit to Stand: +2 physical assistance, +2 safety/equipment, Max assist           General transfer comment: required positioning of the feet on the stedy , Patient able to pull to stand with mod support with bed raised, sat on flaps  for transfer. Transfer to recliner, pulled to stand again at [facility] bar. Transfer via Lift Equipment: Stedy  Ambulation/Gait                  Stairs            Wheelchair Mobility     Tilt Bed    Modified Rankin (Stroke Patients Only)       Balance Overall balance assessment: Needs assistance Sitting-balance support: Bilateral upper extremity supported, Feet unsupported Sitting balance-Leahy Scale: Poor     Standing balance support: Reliant on assistive device for balance, Bilateral upper extremity supported Standing balance-Leahy Scale: Zero                               Pertinent Vitals/Pain Pain Assessment Pain Assessment: Faces Pain Location: [place]  rubbed on  knees as pt. reports pain  but then denied that there was pain.    Home Living Family/patient expects to be discharged to:: Skilled nursing facility                        Prior Function Prior Level of Function : Needs assist             Mobility Comments: reported she was unable to walk ADLs Comments: chart indicated patient was living at [facility] after hospitalization about two months ago.     Extremity/Trunk Assessment    Upper Extremity Assessment Upper Extremity Assessment: Overall WFL for tasks assessed    Lower Extremity Assessment Lower Extremity Assessment: RLE deficits/detail;LLE deficits/detail (tends to be wind swept position of  BLE to the left) RLE Deficits / Details: decrease  hip IR, noted  when sitting LLE Deficits / Details: significant genu valgus    Cervical / Trunk Assessment Cervical / Trunk Assessment: Other exceptions Cervical / Trunk Exceptions: noted to have uncontrolled movement of jaw during session. patient indicated it was normal for her.  Communication   Communication Communication: Impaired Factors Affecting Communication: Hearing impaired;Other (comment)    Cognition Arousal: Alert Behavior During Therapy: Flat affect                           PT - Cognition Comments: oriented x 4   Following commands impaired: Follows one step commands inconsistently (? due to Loss of hearing)     Cueing Cueing Techniques: Verbal cues, Gestural cues     General Comments      Exercises     Assessment/Plan    PT Assessment Patient needs continued PT services  PT Problem List Decreased strength;Decreased activity tolerance;Decreased mobility;Decreased safety awareness;Decreased range of motion;Decreased balance;Decreased knowledge of precautions       PT Treatment Interventions DME instruction;Therapeutic activities;Functional mobility training;Patient/family education;Therapeutic exercise    PT Goals (Current goals can be found in the Care Plan section)  Acute Rehab PT Goals PT Goal Formulation: Patient unable to participate in goal setting Time For Goal Achievement: 10/11/23 Potential to Achieve Goals: Fair    Frequency Min 2X/week     Co-evaluation PT/OT/SLP Co-Evaluation/Treatment: Yes Reason for Co-Treatment: To address functional/ADL transfers PT goals addressed during session: Mobility/safety with mobility OT goals addressed during session: ADL's  and self-care       AM-PAC PT 6 Clicks Mobility  Outcome Measure Help needed turning from your back to your side while in a flat bed without using bedrails?: A Lot Help needed moving from lying on your back to sitting on the side of a flat bed without using bedrails?: A Lot Help needed moving to and from a bed to a chair (including a wheelchair)?: Total Help needed standing up from a chair using your arms (e.g., wheelchair or bedside chair)?: Total Help needed to walk in hospital room?: Total Help needed climbing 3-5 steps with a railing? : Total 6 Click Score: 8    End of Session Equipment Utilized During Treatment: Gait belt Activity Tolerance: Patient tolerated treatment well Patient left: in chair;with call bell/phone within reach;with chair alarm set Nurse Communication: Mobility status;Need for lift equipment PT Visit Diagnosis: Unsteadiness on feet (R26.81);Muscle weakness (generalized) (M62.81);Difficulty in walking, not elsewhere classified (R26.2);History of falling (Z91.81);Other abnormalities of gait and mobility (R26.89)    Time: 0454-0981 PT Time Calculation (min) (ACUTE ONLY): 21 min   Charges:  PT Evaluation $PT Eval Low Complexity: 1 Low   PT General Charges $$ ACUTE PT VISIT: 1 Visit         Abelina Hoes PT Acute Rehabilitation Services Office [phone]   Dareen Ebbing 09/27/2023, 12:56 PM

## 2023-09-27 NOTE — Progress Notes (Cosign Needed Addendum)
 NAME:  [name] [name], MRN:  [id number], DOB:  September 17, 1931, LOS: 2 ADMISSION DATE:  09/25/2023, CONSULTATION DATE:  09/26/2023 REFERRING MD:  Dr Gillermo Lack, CHIEF COMPLAINT:  Hemoptysis   History of Present Illness:  Patient with shortness of breath, hemoptysis-difficult to get a good history from her because she states she does not remember  History of anxiety, depression, osteoarthritis, COPD, type 2 diabetes, hypertension, UTI Woke up from sleep around 4 AM with hemoptysis, EMS found with saturations in the 60s, placed on nasal cannula was given bronchodilators  Patient could not tell me whether she was coughing prior to onset of symptoms or if she was feeling unwell prior to onset of symptoms Denies pain or discomfort, denies weight loss, Was not a smoker  Noted to have leg swelling, a CT scan of the chest was obtained - Right infrahilar consolidation, possible mass lesion  She is DNR  Pertinent  Medical History   Past Medical History:  Diagnosis Date   Anxiety    Arthritis    Atrophic vaginitis    Cataract    COPD (chronic obstructive pulmonary disease) (HCC)    Depressive disorder, not elsewhere classified    Diabetes mellitus without complication (HCC)    Disorder of bone and cartilage, unspecified    Elevated blood sugar    Emphysema lung (HCC)    Hypertension    Other and unspecified hyperlipidemia    Postmenopausal    Rhinitis    Significant Hospital Events: Including procedures, antibiotic start and stop dates in addition to other pertinent events   CT chest 09/26/2023-no pulmonary embolism small masslike consolidation in the infrahilar right lower lobe, trace pleural effusions  Interim History / Subjective:  Coughing up small amount of bloody sputum this am  Objective    Blood pressure (!) 153/107, pulse 74, temperature 98.2 F (36.8 C), temperature source Oral, resp. rate 19, height 5' 2 (1.575 m), weight 63.6 kg, SpO2 93%.    FiO2 (%):  [21 %] 21 %    Intake/Output Summary (Last 24 hours) at 09/27/2023 0717 Last data filed at 09/27/2023 0557 Gross per 24 hour  Intake 583.79 ml  Output 1550 ml  Net -966.21 ml   Filed Weights   09/25/23 0634 09/25/23 1348  Weight: 58.5 kg 63.6 kg    Examination: General:  elderly appearing female in NAD HEENT: MM pink/moist Neuro: alert/oriented to person; mae CV: s1s2, RRR, no m/r/g PULM:  dim clear BS bilaterally GI: soft, bsx4 active  Extremities: warm/dry, no edema  Skin: no rashes or lesions    Resolved problem list   Assessment and Plan  Acute respiratory failure w/ hypoxia Hemoptysis Possible COPD exacerbation: on symbicort  Infrahilar lesion/consolidation Possible CAP Plan: -cont [place] and wean for sats >92% -txa nebs q8 -cont azithro/rocephin  for cap ppx -change scheduled xopenex/atrovent to brovana/pulmicort  -will need CT follow-up in about 2 to 3 months to follow-up in the infrahilar lesion  Depression Anxiety T2DM HTN HLD Hypocalcemia Hyponatremia Plan: -per primary   Best Practice (right click and Reselect all SmartList Selections daily)   Per primary  Labs   CBC: Recent Labs  Lab 09/25/23 0625 09/26/23 0250 09/27/23 0238  WBC 11.8* 10.4 10.8*  NEUTROABS  --   --  7.4  HGB 11.2* 9.8* 9.2*  HCT 34.7* 29.4* 28.7*  MCV 99.1 95.8 99.7  PLT 365 348 310    Basic Metabolic Panel: Recent Labs  Lab 09/25/23 0625 09/26/23 0250 09/27/23 0238  NA 127* 127* 127*  K 4.1 5.0 4.4  CL 96* 96* 96*  CO2 22 21* 21*  GLUCOSE 160* 148* 97  BUN 18 24* 26*  CREATININE 0.55 0.73 0.76  CALCIUM 8.6* 8.7* 8.5*  MG  --   --  1.8  PHOS  --   --  3.1   GFR: Estimated Creatinine Clearance: 41.8 mL/min (by C-G formula based on SCr of 0.76 mg/dL). Recent Labs  Lab 09/25/23 0625 09/25/23 0835 09/25/23 1716 09/25/23 2114 09/26/23 0250 09/26/23 0840 09/26/23 0946 09/27/23 0238  PROCALCITON  --   --   --   --   --  <0.10  --   --   WBC 11.8*  --   --   --   10.4  --   --  10.8*  LATICACIDVEN  --    < > 3.2* 2.0*  --  0.9 1.1  --    < > = values in this interval not displayed.    Liver Function Tests: Recent Labs  Lab 09/26/23 0250 09/27/23 0238  AST 23 28  ALT 15 15  ALKPHOS 61 52  BILITOT 0.6 0.5  PROT 6.4* 6.2*  ALBUMIN 3.3* 3.3*   No results for input(s): LIPASE, AMYLASE in the last 168 hours. No results for input(s): AMMONIA in the last 168 hours.  ABG No results found for: PHART, PCO2ART, PO2ART, HCO3, TCO2, ACIDBASEDEF, O2SAT   Coagulation Profile: No results for input(s): INR, PROTIME in the last 168 hours.  Cardiac Enzymes: No results for input(s): CKTOTAL, CKMB, CKMBINDEX, TROPONINI in the last 168 hours.  HbA1C: HB A1C (BAYER DCA - WAIVED)  Date/Time Value Ref Range Status  02/03/2019 09:43 AM 6.3 <7.0 % Final    Comment:                                          Diabetic Adult            <7.0                                       Healthy Adult        4.3 - 5.7                                                           (DCCT/NGSP) American Diabetes Association's Summary of Glycemic Recommendations for Adults with Diabetes: Hemoglobin A1c <7.0%. More stringent glycemic goals (A1c <6.0%) may further reduce complications at the cost of increased risk of hypoglycemia.   05/02/2018 10:00 AM 6.0 <7.0 % Final    Comment:                                          Diabetic Adult            <7.0                                       Healthy Adult  4.3 - 5.7                                                           (DCCT/NGSP) American Diabetes Association's Summary of Glycemic Recommendations for Adults with Diabetes: Hemoglobin A1c <7.0%. More stringent glycemic goals (A1c <6.0%) may further reduce complications at the cost of increased risk of hypoglycemia.    Hgb A1c MFr Bld  Date/Time Value Ref Range Status  09/25/2023 05:16 PM 6.3 (H) 4.8 - 5.6 % Final    Comment:     (NOTE) Diagnosis of Diabetes The following HbA1c ranges recommended by the American Diabetes Association (ADA) may be used as an aid in the diagnosis of diabetes mellitus.  Hemoglobin             Suggested A1C NGSP%              Diagnosis  <5.7                   Non Diabetic  5.7-6.4                Pre-Diabetic  >6.4                   Diabetic  <7.0                   Glycemic control for                       adults with diabetes.    01/12/2023 05:46 AM 6.4 (H) 4.8 - 5.6 % Final    Comment:    (NOTE) Pre diabetes:          5.7%-6.4%  Diabetes:              >6.4%  Glycemic control for   <7.0% adults with diabetes     CBG: Recent Labs  Lab 09/25/23 2253 09/26/23 0756 09/26/23 1155 09/26/23 1614 09/26/23 2124  GLUCAP 171* 104* 118* 126* 92    Review of Systems:   Denies any significant symptoms at present  Past Medical History:  She,  has a past medical history of Anxiety, Arthritis, Atrophic vaginitis, Cataract, COPD (chronic obstructive pulmonary disease) (HCC), Depressive disorder, not elsewhere classified, Diabetes mellitus without complication (HCC), Disorder of bone and cartilage, unspecified, Elevated blood sugar, Emphysema lung (HCC), Hypertension, Other and unspecified hyperlipidemia, Postmenopausal, and Rhinitis.   Surgical History:   Past Surgical History:  Procedure Laterality Date   APPENDECTOMY  1957   CATARACT EXTRACTION W/ INTRAOCULAR LENS IMPLANT Bilateral 2005   [place]   COLONOSCOPY N/A 03/08/2014   Procedure: COLONOSCOPY;  Surgeon: Ruby Corporal, MD;  Location: [facility];  Service: Endoscopy;  Laterality: N/A;   EYE SURGERY     JOINT REPLACEMENT     TOTAL HIP ARTHROPLASTY Left 12/27/2013   Procedure: LEFT TOTAL HIP ARTHROPLASTY;  Surgeon: Timothy Ford, MD;  Location: [facility] OR;  Service: Orthopedics;  Laterality: Left;     Social History:   reports that she has never smoked. She has never used smokeless tobacco. She reports that she  does not drink alcohol and does not use drugs.   Family History:  Her family history includes Aneurysm in her sister; Arthritis in her mother; COPD in her daughter  and father; Diabetes in her father, sister, and sister; Fibromyalgia in her daughter; Healthy in her daughter; Hypertension in her sister.   Allergies Allergies  Allergen Reactions   Lisinopril  Cough   Penicillins Other (See Comments)    Can't remember   Betadine [Povidone Iodine] Rash   Hydrogen Peroxide Rash   Sertraline Hcl Other (See Comments)    Sweating and mouth irritation    Sulfa Antibiotics Nausea And Vomiting    CCT: NA  JD Vira Grieves [facility] 09/27/2023, 7:58 AM  Please see [email or web address] for pager details.  From 7A-7P if no response, please call [phone]. After hours, please call ELink [phone].

## 2023-09-27 NOTE — Progress Notes (Addendum)
 Witnessed aspiration event with breakfast. Pt sipped drink and immediately began choking. She continued attempting to eat/drink but continued to choke at intervals. Food was then removed and Dr. Rosaria Common notified. Pt had to be placed on 4L O2 and was made NPO pending modified barium swallow.

## 2023-09-28 ENCOUNTER — Inpatient Hospital Stay (HOSPITAL_COMMUNITY)

## 2023-09-28 ENCOUNTER — Encounter (HOSPITAL_COMMUNITY): Payer: Self-pay

## 2023-09-28 DIAGNOSIS — J9601 Acute respiratory failure with hypoxia: Secondary | ICD-10-CM | POA: Diagnosis not present

## 2023-09-28 DIAGNOSIS — J189 Pneumonia, unspecified organism: Secondary | ICD-10-CM | POA: Diagnosis not present

## 2023-09-28 LAB — COMPREHENSIVE METABOLIC PANEL WITH GFR
ALT: 13 U/L (ref 0–44)
AST: 24 U/L (ref 15–41)
Albumin: 3.1 g/dL — ABNORMAL LOW (ref 3.5–5.0)
Alkaline Phosphatase: 55 U/L (ref 38–126)
Anion gap: 8 (ref 5–15)
BUN: 17 mg/dL (ref 8–23)
CO2: 21 mmol/L — ABNORMAL LOW (ref 22–32)
Calcium: 8.3 mg/dL — ABNORMAL LOW (ref 8.9–10.3)
Chloride: 99 mmol/L (ref 98–111)
Creatinine, Ser: 0.89 mg/dL (ref 0.44–1.00)
GFR, Estimated: >60 mL/min (ref >60)
Glucose, Bld: 119 mg/dL — ABNORMAL HIGH (ref 70–99)
Potassium: 4.1 mmol/L (ref 3.5–5.1)
Sodium: 128 mmol/L — ABNORMAL LOW (ref 135–145)
Total Bilirubin: 0.7 mg/dL (ref 0.0–1.2)
Total Protein: 5.8 g/dL — ABNORMAL LOW (ref 6.5–8.1)

## 2023-09-28 LAB — CBC WITH DIFFERENTIAL/PLATELET
Abs Immature Granulocytes: 0.03 10*3/uL (ref 0.00–0.07)
Basophils Absolute: 0 10*3/uL (ref 0.0–0.1)
Basophils Relative: 0 %
Eosinophils Absolute: 0.3 10*3/uL (ref 0.0–0.5)
Eosinophils Relative: 3 %
HCT: 30.8 % — ABNORMAL LOW (ref 36.0–46.0)
Hemoglobin: 9.6 g/dL — ABNORMAL LOW (ref 12.0–15.0)
Immature Granulocytes: 0 %
Lymphocytes Relative: 17 %
Lymphs Abs: 1.6 10*3/uL (ref 0.7–4.0)
MCH: 32.2 pg (ref 26.0–34.0)
MCHC: 31.2 g/dL (ref 30.0–36.0)
MCV: 103.4 fL — ABNORMAL HIGH (ref 80.0–100.0)
Monocytes Absolute: 0.8 10*3/uL (ref 0.1–1.0)
Monocytes Relative: 9 %
Neutro Abs: 6.5 10*3/uL (ref 1.7–7.7)
Neutrophils Relative %: 71 %
Platelets: 293 10*3/uL (ref 150–400)
RBC: 2.98 MIL/uL — ABNORMAL LOW (ref 3.87–5.11)
RDW: 12.5 % (ref 11.5–15.5)
WBC: 9.3 10*3/uL (ref 4.0–10.5)
nRBC: 0 % (ref 0.0–0.2)

## 2023-09-28 LAB — GLUCOSE, CAPILLARY
Glucose-Capillary: 106 mg/dL — ABNORMAL HIGH (ref 70–99)
Glucose-Capillary: 118 mg/dL — ABNORMAL HIGH (ref 70–99)
Glucose-Capillary: 206 mg/dL — ABNORMAL HIGH (ref 70–99)
Glucose-Capillary: 212 mg/dL — ABNORMAL HIGH (ref 70–99)
Glucose-Capillary: 81 mg/dL (ref 70–99)

## 2023-09-28 LAB — PHOSPHORUS: Phosphorus: 3.5 mg/dL (ref 2.5–4.6)

## 2023-09-28 LAB — MAGNESIUM: Magnesium: 2.2 mg/dL (ref 1.7–2.4)

## 2023-09-28 MED ORDER — HYDROCODONE BIT-HOMATROP MBR 5-1.5 MG/5ML PO SOLN
5.0000 mL | Freq: Four times a day (QID) | ORAL | Status: DC | PRN
  Administered 2023-09-28 – 2023-10-01 (×3): 5 mL via ORAL
  Filled 2023-09-28 (×3): qty 5

## 2023-09-28 MED ORDER — SENNOSIDES-DOCUSATE SODIUM 8.6-50 MG PO TABS
1.0000 | ORAL_TABLET | Freq: Two times a day (BID) | ORAL | Status: DC
  Administered 2023-09-28 – 2023-10-01 (×4): 1 via ORAL
  Filled 2023-09-28 (×5): qty 1

## 2023-09-28 MED ORDER — BISACODYL 10 MG RE SUPP: 10.0000 mg | Freq: Every day | RECTAL | Status: DC | PRN

## 2023-09-28 MED ORDER — GUAIFENESIN ER 600 MG PO TB12
600.0000 mg | ORAL_TABLET | Freq: Two times a day (BID) | ORAL | Status: DC
  Administered 2023-09-28 – 2023-10-01 (×5): 600 mg via ORAL
  Filled 2023-09-28 (×8): qty 1

## 2023-09-28 MED ORDER — POLYETHYLENE GLYCOL 3350 17 G PO PACK
17.0000 g | PACK | Freq: Two times a day (BID) | ORAL | Status: DC
  Administered 2023-09-28 – 2023-09-29 (×2): 17 g via ORAL
  Filled 2023-09-28 (×2): qty 1

## 2023-09-28 MED ORDER — STERILE WATER FOR INJECTION IJ SOLN
INTRAMUSCULAR | Status: AC
  Administered 2023-09-28: 10 mL
  Filled 2023-09-28: qty 10

## 2023-09-28 NOTE — Progress Notes (Signed)
 NAME:  [name] [name], MRN:  [id number], DOB:  02/19/1932, LOS: 3 ADMISSION DATE:  09/25/2023, CONSULTATION DATE:  09/26/2023 REFERRING MD:  Dr Gillermo Lack, CHIEF COMPLAINT:  Hemoptysis   History of Present Illness:  Patient with shortness of breath, hemoptysis-difficult to get a good history from her because she states she does not remember  History of anxiety, depression, osteoarthritis, COPD, type 2 diabetes, hypertension, UTI Woke up from sleep around 4 AM with hemoptysis, EMS found with saturations in the 60s, placed on nasal cannula was given bronchodilators  Patient could not tell me whether she was coughing prior to onset of symptoms or if she was feeling unwell prior to onset of symptoms Denies pain or discomfort, denies weight loss, Was not a smoker  Noted to have leg swelling, a CT scan of the chest was obtained - Right infrahilar consolidation, possible mass lesion  She is DNR  Pertinent  Medical History   Past Medical History:  Diagnosis Date   Anxiety    Arthritis    Atrophic vaginitis    Cataract    COPD (chronic obstructive pulmonary disease) (HCC)    Depressive disorder, not elsewhere classified    Diabetes mellitus without complication (HCC)    Disorder of bone and cartilage, unspecified    Elevated blood sugar    Emphysema lung (HCC)    Hypertension    Other and unspecified hyperlipidemia    Postmenopausal    Rhinitis    Significant Hospital Events: Including procedures, antibiotic start and stop dates in addition to other pertinent events   CT chest 09/26/2023-no pulmonary embolism small masslike consolidation in the infrahilar right lower lobe, trace pleural effusions  Interim History / Subjective:  Later yesterday am had aspiration episode. Flagyl  added to rocephin .  Still coughing up some pink/blood tinged sputum   Objective    Blood pressure (!) 141/59, pulse 62, temperature 97.8 F (36.6 C), temperature source Axillary, resp. rate 13, height 5'  2 (1.575 m), weight 63.6 kg, SpO2 97%.        Intake/Output Summary (Last 24 hours) at 09/28/2023 0720 Last data filed at 09/28/2023 0345 Gross per 24 hour  Intake 297.13 ml  Output 600 ml  Net -302.87 ml   Filed Weights   09/25/23 0634 09/25/23 1348  Weight: 58.5 kg 63.6 kg    Examination: General:  elderly appearing female in NAD HEENT: MM pink/moist Neuro: alert/oriented to person; mae CV: s1s2, RRR, no m/r/g PULM:  dim clear BS bilaterally GI: soft, bsx4 active  Extremities: warm/dry, no edema  Skin: no rashes or lesions    Resolved problem list   Assessment and Plan  Acute respiratory failure w/ hypoxia Hemoptysis Possible COPD exacerbation: on symbicort  Infrahilar lesion/consolidation Possible CAP vs. aspiration Plan: -abx changed to rocephin /flagyl  for possible aspiration -follow cultures -lovenox  being held the last few nights w/ hemoptysis; scd for dvt ppx -cont to wean McRoberts for sats >92% -cont brovana/pulmicort  -cough suppressants -slp following; recommending dysphagia 3 diet -will need CT follow-up in about 2 to 3 months to follow-up in the infrahilar lesion  Depression Anxiety T2DM HTN HLD Hypocalcemia Hyponatremia Plan: -per primary   Best Practice (right click and Reselect all SmartList Selections daily)   Per primary  Labs   CBC: Recent Labs  Lab 09/25/23 0625 09/26/23 0250 09/27/23 0238 09/28/23 0320  WBC 11.8* 10.4 10.8* 9.3  NEUTROABS  --   --  7.4 6.5  HGB 11.2* 9.8* 9.2* 9.6*  HCT 34.7* 29.4* 28.7* 30.8*  MCV 99.1 95.8 99.7 103.4*  PLT 365 348 310 293    Basic Metabolic Panel: Recent Labs  Lab 09/25/23 0625 09/26/23 0250 09/27/23 0238 09/28/23 0320  NA 127* 127* 127* 128*  K 4.1 5.0 4.4 4.1  CL 96* 96* 96* 99  CO2 22 21* 21* 21*  GLUCOSE 160* 148* 97 119*  BUN 18 24* 26* 17  CREATININE 0.55 0.73 0.76 0.89  CALCIUM 8.6* 8.7* 8.5* 8.3*  MG  --   --  1.8 2.2  PHOS  --   --  3.1 3.5   GFR: Estimated  Creatinine Clearance: 37.5 mL/min (by C-G formula based on SCr of 0.89 mg/dL). Recent Labs  Lab 09/25/23 0625 09/25/23 0835 09/25/23 1716 09/25/23 2114 09/26/23 0250 09/26/23 0840 09/26/23 0946 09/27/23 0238 09/28/23 0320  PROCALCITON  --   --   --   --   --  <0.10  --   --   --   WBC 11.8*  --   --   --  10.4  --   --  10.8* 9.3  LATICACIDVEN  --    < > 3.2* 2.0*  --  0.9 1.1  --   --    < > = values in this interval not displayed.    Liver Function Tests: Recent Labs  Lab 09/26/23 0250 09/27/23 0238 09/28/23 0320  AST 23 28 24   ALT 15 15 13   ALKPHOS 61 52 55  BILITOT 0.6 0.5 0.7  PROT 6.4* 6.2* 5.8*  ALBUMIN 3.3* 3.3* 3.1*   No results for input(s): LIPASE, AMYLASE in the last 168 hours. No results for input(s): AMMONIA in the last 168 hours.  ABG No results found for: PHART, PCO2ART, PO2ART, HCO3, TCO2, ACIDBASEDEF, O2SAT   Coagulation Profile: No results for input(s): INR, PROTIME in the last 168 hours.  Cardiac Enzymes: No results for input(s): CKTOTAL, CKMB, CKMBINDEX, TROPONINI in the last 168 hours.  HbA1C: HB A1C (BAYER DCA - WAIVED)  Date/Time Value Ref Range Status  02/03/2019 09:43 AM 6.3 <7.0 % Final    Comment:                                          Diabetic Adult            <7.0                                       Healthy Adult        4.3 - 5.7                                                           (DCCT/NGSP) American Diabetes Association's Summary of Glycemic Recommendations for Adults with Diabetes: Hemoglobin A1c <7.0%. More stringent glycemic goals (A1c <6.0%) may further reduce complications at the cost of increased risk of hypoglycemia.   05/02/2018 10:00 AM 6.0 <7.0 % Final    Comment:  Diabetic Adult            <7.0                                       Healthy Adult        4.3 - 5.7                                                            (DCCT/NGSP) American Diabetes Association's Summary of Glycemic Recommendations for Adults with Diabetes: Hemoglobin A1c <7.0%. More stringent glycemic goals (A1c <6.0%) may further reduce complications at the cost of increased risk of hypoglycemia.    Hgb A1c MFr Bld  Date/Time Value Ref Range Status  09/25/2023 05:16 PM 6.3 (H) 4.8 - 5.6 % Final    Comment:    (NOTE) Diagnosis of Diabetes The following HbA1c ranges recommended by the American Diabetes Association (ADA) may be used as an aid in the diagnosis of diabetes mellitus.  Hemoglobin             Suggested A1C NGSP%              Diagnosis  <5.7                   Non Diabetic  5.7-6.4                Pre-Diabetic  >6.4                   Diabetic  <7.0                   Glycemic control for                       adults with diabetes.    01/12/2023 05:46 AM 6.4 (H) 4.8 - 5.6 % Final    Comment:    (NOTE) Pre diabetes:          5.7%-6.4%  Diabetes:              >6.4%  Glycemic control for   <7.0% adults with diabetes     CBG: Recent Labs  Lab 09/26/23 2124 09/27/23 0726 09/27/23 1317 09/27/23 1644 09/27/23 2139  GLUCAP 92 100* 120* 118* 119*    Review of Systems:   Denies any significant symptoms at present  Past Medical History:  She,  has a past medical history of Anxiety, Arthritis, Atrophic vaginitis, Cataract, COPD (chronic obstructive pulmonary disease) (HCC), Depressive disorder, not elsewhere classified, Diabetes mellitus without complication (HCC), Disorder of bone and cartilage, unspecified, Elevated blood sugar, Emphysema lung (HCC), Hypertension, Other and unspecified hyperlipidemia, Postmenopausal, and Rhinitis.   Surgical History:   Past Surgical History:  Procedure Laterality Date   APPENDECTOMY  1957   CATARACT EXTRACTION W/ INTRAOCULAR LENS IMPLANT Bilateral 2005   [facility]   COLONOSCOPY N/A 03/08/2014   Procedure: COLONOSCOPY;  Surgeon: Ruby Corporal, MD;  Location: [facility];   Service: Endoscopy;  Laterality: N/A;   EYE SURGERY     JOINT REPLACEMENT     TOTAL HIP ARTHROPLASTY Left 12/27/2013   Procedure: LEFT TOTAL HIP ARTHROPLASTY;  Surgeon: Timothy Ford, MD;  Location: [facility]  OR;  Service: Orthopedics;  Laterality: Left;     Social History:   reports that she has never smoked. She has never used smokeless tobacco. She reports that she does not drink alcohol and does not use drugs.   Family History:  Her family history includes Aneurysm in her sister; Arthritis in her mother; COPD in her daughter and father; Diabetes in her father, sister, and sister; Fibromyalgia in her daughter; Healthy in her daughter; Hypertension in her sister.   Allergies Allergies  Allergen Reactions   Lisinopril  Cough   Penicillins Other (See Comments)    Can't remember   Betadine [Povidone Iodine] Rash   Hydrogen Peroxide Rash   Sertraline Hcl Other (See Comments)    Sweating and mouth irritation    Sulfa Antibiotics Nausea And Vomiting    CCT: NA  JD Carliss Chess [facility] 09/28/2023, 7:20 AM  Please see [email or web address] for pager details.  From 7A-7P if no response, please call [phone]. After hours, please call ELink [phone].

## 2023-09-28 NOTE — Progress Notes (Signed)
 PROGRESS NOTE    [name] [name]  WUJ:[id number] DOB: 1931-11-07 DOA: 09/25/2023 PCP: Patient, No Pcp Per   Brief Narrative:  [name] [name] is a 89 y.o. female with medical history significant of anxiety, depression, osteoarthritis, atrophic vaginitis, cataracts, COPD, type 2 diabetes, hypertension, history of rhabdomyolysis, transaminitis, history of UTI who was sent from: [facility] with sudden onset of dyspnea and hemoptysis that woke her up around 0400.  O2 sat was in the 60s when EMS arrived.  She was placed on nasal cannula oxygen, given a DuoNeb, and albuterol  neb and 125 mg of Solu-Medrol . Continued to have Hemoptysis overnight so given TXA nebs. Pulmonary consulted and monitoring closely.  Today the patient was witnessed to aspiration SLP done and shows that she has significant reflux causing aspiration.  She has not been placed on a dysphagia 3 diet with nectar thick liquids.  We have changed her antibiotics to cover aspiration by adding Flagyl .  She is now on ceftriaxone  and Flagyl .  She will need an amatory home O2 screen and repeat chest x-ray in AM.  Assessment and Plan:  Acute Respiratory Failure with hypoxia (HCC) In the setting of:CAP (community acquired pneumonia) and COPD with acute exacerbation (HCC) With associated Sepsis (HCC): Admit to SDU/inpatient. Continue supplemental oxygen. Scheduled and as needed bronchodilators. Continue ceftriaxone  1 g IVPB daily but change azithromycin  to Flagyl  given that she is aspirating.  Check strep pneumoniae urinary antigen and Check sputum Gram stain, culture and sensitivity. Follow-up blood culture and sensitivity. Follow-up CBC and chemistry in the morning.   -SLP evaluation done and MBS done she has quite a bit of reflux with retrograde noted of the majority of barium going to the level of thoracic esophagus and now SLP is recommending dysphagia 3 diet with nectar thick liquids. Initially thought lactic acidosis from bronchodilators but is  improved and is now 1.1. PCT is <0.10. WBC went from 11.8 -> 10.4 -> 10.8. IVF held in the ED due to questionable pulmonary edema/elevated BNP. However, normal heart size on CTA chest. C/w Duoneb q4hprn SOB and Wheezing and Xopenex/Atrovent changed to Brovana and Budesonide .  -ECHO done for Dyspnea and showed Left ventricular ejection fraction, by estimation, is 50 to 55%. The left ventricle has low normal function. The left ventricle has no regional wall motion abnormalities. Left ventricular diastolic parameters are indeterminate. Repeat CXR this AM showed Patchy opacity in the medial right base compatible with airspace disease seen in this region on CT scan 2 days ago.   Hemoptysis: Getting Tranexamic Acid 500 mg Neb and now being repeated q8h x2 Days. CTM Blood Count.  CT scan done and was concerning for a consolidation and mass of the right fibrous base.  Continue antibiotics and bronchodilators.  Pulm consulted and given that she is quite frail and invasive investigation would not be in her best interest and recommending repeating follow-up in 2 to 3 months for hospitalization.  Depression with Anxiety: Continue Escitalopram  20 mg p.o. daily and Alprazolam  0.25 mg po Daily PRN Anxiety.   Type 2 diabetes mellitus without complication, without long-term current use of insulin  (HCC): Carbohydrate modified diet.Continue Hold Metformin . C/w Moderate Novolog  SSI AC. Check hemoglobin A1c.   Pressure Injury of Skin: Preventive measures.    Essential Hypertension: C/w Atenolol  25 mg po BID and Losartan  25 mg po Daily. CTM Monitor BP per Protocol. Last BP reading was 139/71   Hyperlipidemia: Continue Lovastatin  formulary equivalent of Pravastatin  40 mg po Daily.   Hypocalcemia: Mild.  Calcium corrected for Albumin level is normal.    Metabolic Acidosis: Mild. Patient's CO2 is now 21, AG is 10, Chloride Level of 96. CTM and Trend and repeat CMP in the AM.   HypoNatremia: Na+ is now 127 again. CTM and  Trend and repeat CMP in the AM   Normocytic Anemia: Hgb/Hct went from 11.2/34.7 -> 9.8/29.4 -> 9.2/28.7. Check Anemia Panel in the AM. CTM for S/Sx of Bleeding; No overt bleeding noted. Repeat CBC in the AM  Hypoalbuminemia: Patient's Albumin Level is now 3.3. CTM and Trend and repeat CMP in the AM  Overweight: Complicates overall prognosis and care. Estimated body mass index is 25.65 kg/m as calculated from the following:   Height as of this encounter: 5' 2 (1.575 m).   Weight as of this encounter: 63.6 kg. Weight Loss and Dietary Counseling given   DVT prophylaxis: Place and maintain sequential compression device Start: 09/26/23 0130    Code Status: Limited: Do not attempt resuscitation (DNR) -DNR-LIMITED -Do Not Intubate/DNI  Family Communication: No family present @ bedside   Disposition Plan:  Level of care: Stepdown Status is: Inpatient Remains inpatient appropriate because: Needs further clinical improvement and clearance by the specialist   Consultants:  Pulmonary  Procedures:  As delineated as above  Antimicrobials:  Anti-infectives (From admission, onward)    Start     Dose/Rate Route Frequency Ordered Stop   09/27/23 1100  metroNIDAZOLE  (FLAGYL ) IVPB 500 mg        500 mg 100 mL/hr over 60 Minutes Intravenous 2 times daily 09/27/23 1013     09/26/23 0800  cefTRIAXone  (ROCEPHIN ) 2 g in sodium chloride  0.9 % 100 mL IVPB        2 g 200 mL/hr over 30 Minutes Intravenous Every 24 hours 09/25/23 1318 09/30/23 0759   09/26/23 0800  azithromycin  (ZITHROMAX ) 500 mg in sodium chloride  0.9 % 250 mL IVPB  Status:  Discontinued        500 mg 250 mL/hr over 60 Minutes Intravenous Every 24 hours 09/25/23 1318 09/27/23 1013   09/25/23 0830  cefTRIAXone  (ROCEPHIN ) 1 g in sodium chloride  0.9 % 100 mL IVPB        1 g 200 mL/hr over 30 Minutes Intravenous  Once 09/25/23 0824 09/25/23 1002   09/25/23 0830  azithromycin  (ZITHROMAX ) 500 mg in sodium chloride  0.9 % 250 mL IVPB         500 mg 250 mL/hr over 60 Minutes Intravenous  Once 09/25/23 0824 09/25/23 1042       Subjective: Seen and examined at bedside and she was doing okay I think she is doing better done yesterday.  Still coughing and nurse states there is a little pinkish.  No nausea or vomiting.  No other concerns or complaints at this time.  Objective: Vitals:   09/28/23 1535 09/28/23 1600 09/28/23 2008 09/28/23 2045  BP:  (!) 121/33    Pulse: 65 62    Resp: 15 16    Temp: 98.6 F (37 C)  98.2 F (36.8 C)   TempSrc: Oral  Oral   SpO2: 97% 97%  95%  Weight:      Height:        Intake/Output Summary (Last 24 hours) at 09/28/2023 2053 Last data filed at 09/28/2023 1913 Gross per 24 hour  Intake 297.13 ml  Output 900 ml  Net -602.87 ml   Filed Weights   09/25/23 0634 09/25/23 1348  Weight: 58.5 kg 63.6 kg   Examination:  Physical Exam:  Constitutional: Slightly elderly chronically ill-appearing Caucasian female who appears more comfortable than yesterday Respiratory: Diminished to auscultation bilaterally with some coarse breath sounds and has some crackles and some rhonchi but no appreciable wheezing or rales.  Has a normal respiratory effort today and is not wearing any supplemental oxygen via nasal cannula Cardiovascular: Mildly tachycardic rate, no murmurs / rubs / gallops. S1 and S2 auscultated.  Mild extremity edema Abdomen: Soft, non-tender, very slightly-distended. Bowel sounds positive.  GU: Deferred. Musculoskeletal: No clubbing / cyanosis of digits/nails. No joint deformity upper and lower extremities.  Skin: No rashes, lesions, ulcers on limited skin evaluation. No induration; Warm and dry.  Neurologic: CN 2-12 grossly intact with no focal deficits but she is very hard of hearing and only really hears out of her left ear. Romberg sign and cerebellar reflexes not assessed.  Psychiatric: She is awake and alert  Data Reviewed: I have personally reviewed following labs and imaging  studies  CBC: Recent Labs  Lab 09/25/23 0625 09/26/23 0250 09/27/23 0238 09/28/23 0320  WBC 11.8* 10.4 10.8* 9.3  NEUTROABS  --   --  7.4 6.5  HGB 11.2* 9.8* 9.2* 9.6*  HCT 34.7* 29.4* 28.7* 30.8*  MCV 99.1 95.8 99.7 103.4*  PLT 365 348 310 293   Basic Metabolic Panel: Recent Labs  Lab 09/25/23 0625 09/26/23 0250 09/27/23 0238 09/28/23 0320  NA 127* 127* 127* 128*  K 4.1 5.0 4.4 4.1  CL 96* 96* 96* 99  CO2 22 21* 21* 21*  GLUCOSE 160* 148* 97 119*  BUN 18 24* 26* 17  CREATININE 0.55 0.73 0.76 0.89  CALCIUM 8.6* 8.7* 8.5* 8.3*  MG  --   --  1.8 2.2  PHOS  --   --  3.1 3.5   GFR: Estimated Creatinine Clearance: 37.5 mL/min (by C-G formula based on SCr of 0.89 mg/dL). Liver Function Tests: Recent Labs  Lab 09/26/23 0250 09/27/23 0238 09/28/23 0320  AST 23 28 24   ALT 15 15 13   ALKPHOS 61 52 55  BILITOT 0.6 0.5 0.7  PROT 6.4* 6.2* 5.8*  ALBUMIN 3.3* 3.3* 3.1*   No results for input(s): LIPASE, AMYLASE in the last 168 hours. No results for input(s): AMMONIA in the last 168 hours. Coagulation Profile: No results for input(s): INR, PROTIME in the last 168 hours. Cardiac Enzymes: No results for input(s): CKTOTAL, CKMB, CKMBINDEX, TROPONINI in the last 168 hours. BNP (last 3 results) No results for input(s): PROBNP in the last 8760 hours. HbA1C: No results for input(s): HGBA1C in the last 72 hours. CBG: Recent Labs  Lab 09/27/23 2139 09/28/23 0730 09/28/23 1140 09/28/23 1536 09/28/23 1617  GLUCAP 119* 106* 118* 206* 212*   Lipid Profile: No results for input(s): CHOL, HDL, LDLCALC, TRIG, CHOLHDL, LDLDIRECT in the last 72 hours. Thyroid  Function Tests: No results for input(s): TSH, T4TOTAL, FREET4, T3FREE, THYROIDAB in the last 72 hours. Anemia Panel: No results for input(s): VITAMINB12, FOLATE, FERRITIN, TIBC, IRON, RETICCTPCT in the last 72 hours. Sepsis Labs: Recent Labs  Lab 09/25/23 1716  09/25/23 2114 09/26/23 0840 09/26/23 0946  PROCALCITON  --   --  <0.10  --   LATICACIDVEN 3.2* 2.0* 0.9 1.1   Recent Results (from the past 240 hours)  Resp panel by RT-PCR (RSV, Flu A&B, Covid) Anterior Nasal Swab     Status: None   Collection Time: 09/25/23  7:00 AM   Specimen: Anterior Nasal Swab  Result Value Ref Range Status   SARS  Coronavirus 2 by RT PCR NEGATIVE NEGATIVE Final    Comment: (NOTE) SARS-CoV-2 target nucleic acids are NOT DETECTED.  The SARS-CoV-2 RNA is generally detectable in upper respiratory specimens during the acute phase of infection. The lowest concentration of SARS-CoV-2 viral copies this assay can detect is 138 copies/mL. A negative result does not preclude SARS-Cov-2 infection and should not be used as the sole basis for treatment or other patient management decisions. A negative result may occur with  improper specimen collection/handling, submission of specimen other than nasopharyngeal swab, presence of viral mutation(s) within the areas targeted by this assay, and inadequate number of viral copies(<138 copies/mL). A negative result must be combined with clinical observations, patient history, and epidemiological information. The expected result is Negative.  Fact Sheet for Patients:  BloggerCourse.com  Fact Sheet for Healthcare Providers:  SeriousBroker.it  This test is no t yet approved or cleared by the [place]  FDA and  has been authorized for detection and/or diagnosis of SARS-CoV-2 by FDA under an Emergency Use Authorization (EUA). This EUA will remain  in effect (meaning this test can be used) for the duration of the COVID-19 declaration under Section 564(b)(1) of the Act, 21 U.S.C.section 360bbb-3(b)(1), unless the authorization is terminated  or revoked sooner.       Influenza A by PCR NEGATIVE NEGATIVE Final   Influenza B by PCR NEGATIVE NEGATIVE Final    Comment:  (NOTE) The Xpert Xpress SARS-CoV-2/FLU/RSV plus assay is intended as an aid in the diagnosis of influenza from Nasopharyngeal swab specimens and should not be used as a sole basis for treatment. Nasal washings and aspirates are unacceptable for Xpert Xpress SARS-CoV-2/FLU/RSV testing.  Fact Sheet for Patients: BloggerCourse.com  Fact Sheet for Healthcare Providers: SeriousBroker.it  This test is not yet approved or cleared by the [place]  FDA and has been authorized for detection and/or diagnosis of SARS-CoV-2 by FDA under an Emergency Use Authorization (EUA). This EUA will remain in effect (meaning this test can be used) for the duration of the COVID-19 declaration under Section 564(b)(1) of the Act, 21 U.S.C. section 360bbb-3(b)(1), unless the authorization is terminated or revoked.     Resp Syncytial Virus by PCR NEGATIVE NEGATIVE Final    Comment: (NOTE) Fact Sheet for Patients: BloggerCourse.com  Fact Sheet for Healthcare Providers: SeriousBroker.it  This test is not yet approved or cleared by the [place]  FDA and has been authorized for detection and/or diagnosis of SARS-CoV-2 by FDA under an Emergency Use Authorization (EUA). This EUA will remain in effect (meaning this test can be used) for the duration of the COVID-19 declaration under Section 564(b)(1) of the Act, 21 U.S.C. section 360bbb-3(b)(1), unless the authorization is terminated or revoked.  Performed at [facility], [address], [place], [place] [address]   Blood culture (routine x 2)     Status: None (Preliminary result)   Collection Time: 09/25/23  8:25 AM   Specimen: BLOOD  Result Value Ref Range Status   Specimen Description   Final    BLOOD LEFT ANTECUBITAL Performed at [facility], [address] [place], [place] [address]    Special Requests    Final    BOTTLES DRAWN AEROBIC AND ANAEROBIC Blood Culture results may not be optimal due to an inadequate volume of blood received in culture bottles Performed at [facility], [address] [place], [place] [address]    Culture   Final    NO GROWTH 3 DAYS Performed  at [facility], [address], [place], [place] [address]    Report Status PENDING  Incomplete  Blood culture (routine x 2)     Status: None (Preliminary result)   Collection Time: 09/25/23  8:35 AM   Specimen: BLOOD  Result Value Ref Range Status   Specimen Description   Final    BLOOD LEFT ANTECUBITAL Performed at [facility], [address] [place], [place] [address]    Special Requests   Final    BOTTLES DRAWN AEROBIC AND ANAEROBIC Blood Culture results may not be optimal due to an inadequate volume of blood received in culture bottles Performed at [facility], [address] [place], [place] [address]    Culture   Final    NO GROWTH 3 DAYS Performed at [facility], [address] [place], [place] [address]    Report Status PENDING  Incomplete  MRSA Next Gen by PCR, Nasal     Status: None   Collection Time: 09/25/23  1:43 PM   Specimen: Nasal Mucosa; Nasal Swab  Result Value Ref Range Status   MRSA by PCR Next Gen NOT DETECTED NOT DETECTED Final    Comment: (NOTE) The GeneXpert MRSA Assay (FDA approved for NASAL specimens only), is one component of a comprehensive MRSA colonization surveillance program. It is not intended to diagnose MRSA infection nor to guide or monitor treatment for MRSA infections. Test performance is not FDA approved in patients less than 52 years old. Performed at [facility], [address], [place], [place] [address]     Radiology Studies: DG CHEST PORT 1 VIEW Result Date: 09/28/2023 CLINICAL DATA:  Shortness of breath. EXAM: PORTABLE CHEST 1 VIEW COMPARISON:  09/27/2023 FINDINGS: The  cardio pericardial silhouette is enlarged. Diffuse interstitial opacity is stable. Interval progression of bibasilar airspace opacity compatible with atelectasis or infection. New tiny bilateral pleural effusions. No acute bony abnormality. Telemetry leads overlie the chest. IMPRESSION: 1. Interval progression of bibasilar airspace opacity compatible with atelectasis or infection. 2. New tiny bilateral pleural effusions. Electronically Signed   By: Donnal Fusi M.D.   On: 09/28/2023 07:48   DG Swallowing Func-Speech Pathology Result Date: 09/27/2023 Table formatting from the original result was not included. Modified Barium Swallow Study Patient Details Name: [name] [name] MRN: [id number] Date of Birth: 1932-01-30 Today's Date: 09/27/2023 HPI/PMH: HPI: History of anxiety, depression, osteoarthritis, COPD, type 2 diabetes, hypertension, UTI  Woke up from sleep around 4 AM with hemoptysis, EMS found with saturations in the 60s, placed on nasal cannula was given bronchodilators. CT scan of the chest was obtained- Right infrahilar consolidation, possible mass lesion. Has been observed to cough with drinking. Clinical Impression: Clinical Impression: Patient presents with an oropharyngeal dysphagia as per this MBS but what is more concerning is her esophageal phase of swallowing with retrograde movement of majority of barium given up to level of thoracic esophagus but staying below PES, prominent cricopharyngael bar which resulted in delays in barium transit. Her esophagus appeared to be filled with air/gas and she did exhibit instances of belching during study. Swallow initiated at level of pyriform sinus with thin liquids and vallecular sinus with solids and honey thick liquids. Swallow initiation was delayed at level of the posterior laryngeal surface of the epiglottis with nectar thick liquids. Penetration to the vocal cords that did not consistently clear out (PAS 3, 5) occured with thin liquids but no aspiration  observed. No penetration or  aspiration observed with nectar or honey thick liquids or puree, mechanical soft solids. Pharyngeal residuals were more prevalent with thin and nectar thick liquids than with honey thick liquids or solids. Patient will be at a high risk of post-prandial aspiration secondary to the significant s/s of esophageal dysphagia observed during this MBS. The safest PO consistency to decrease aspiration risk is dysphagia 3 solids (mechanical soft) and nectar thick liquids. Patient may have plain/thin water  in between meals. SLP will follow for toleration. Factors that may increase risk of adverse event in presence of aspiration Roderick Civatte & Jessy Morocco 2021): Factors that may increase risk of adverse event in presence of aspiration Roderick Civatte & Jessy Morocco 2021): Limited mobility; Frail or deconditioned Recommendations/Plan: Swallowing Evaluation Recommendations Swallowing Evaluation Recommendations Recommendations: PO diet PO Diet Recommendation: Dysphagia 3 (Mechanical soft); Mildly thick liquids (Level 2, nectar thick) Liquid Administration via: Cup; Straw Medication Administration: Crushed with puree Supervision: Staff to assist with self-feeding; Full supervision/cueing for swallowing strategies Swallowing strategies  : Slow rate; Small bites/sips Postural changes: Stay upright 30-60 min after meals; Position pt fully upright for meals Oral care recommendations: Oral care BID (2x/day) Treatment Plan Treatment Plan Treatment recommendations: Therapy as outlined in treatment plan below Follow-up recommendations: Skilled nursing-short term rehab (<3 hours/day) Functional status assessment: Patient has had a recent decline in their functional status and demonstrates the ability to make significant improvements in function in a reasonable and predictable amount of time. Treatment frequency: Min 2x/week Treatment duration: 1 week Interventions: Aspiration precaution training; Diet toleration management by SLP;  Patient/family education; Trials of upgraded texture/liquids Recommendations Recommendations for follow up therapy are one component of a multi-disciplinary discharge planning process, led by the attending physician.  Recommendations may be updated based on patient status, additional functional criteria and insurance authorization. Assessment: Orofacial Exam: Orofacial Exam Oral Cavity: Oral Hygiene: WFL Oral Cavity - Dentition: Adequate natural dentition Orofacial Anatomy: WFL Oral Motor/Sensory Function: WFL Anatomy: Anatomy: Prominent cricopharyngeus Boluses Administered: Boluses Administered Boluses Administered: Thin liquids (Level 0); Mildly thick liquids (Level 2, nectar thick); Moderately thick liquids (Level 3, honey thick); Puree; Solid  Oral Impairment Domain: Oral Impairment Domain Lip Closure: No labial escape Tongue control during bolus hold: Not tested Bolus preparation/mastication: Timely and efficient chewing and mashing Bolus transport/lingual motion: Brisk tongue motion Oral residue: Complete oral clearance Initiation of pharyngeal swallow : Valleculae; Posterior laryngeal surface of the epiglottis; Pyriform sinuses  Pharyngeal Impairment Domain: Pharyngeal Impairment Domain Soft palate elevation: No bolus between soft palate (SP)/pharyngeal wall (PW) Laryngeal elevation: Complete superior movement of thyroid  cartilage with complete approximation of arytenoids to epiglottic petiole Anterior hyoid excursion: Partial anterior movement Epiglottic movement: Complete inversion Laryngeal vestibule closure: Incomplete, narrow column air/contrast in laryngeal vestibule Pharyngeal stripping wave : Present - complete Pharyngeal contraction (A/P view only): N/A Pharyngoesophageal segment opening: Complete distension and complete duration, no obstruction of flow Tongue base retraction: Trace column of contrast or air between tongue base and PPW Pharyngeal residue: Collection of residue within or on pharyngeal  structures Location of pharyngeal residue: Valleculae; Pharyngeal wall; Pyriform sinuses  Esophageal Impairment Domain: Esophageal Impairment Domain Esophageal clearance upright position: Esophageal retention with retrograde flow below pharyngoesophageal segment (PES) Pill: No data recorded Penetration/Aspiration Scale Score: Penetration/Aspiration Scale Score 1.  Material does not enter airway: Moderately thick liquids (Level 3, honey thick); Puree; Solid; Mildly thick liquids (Level 2, nectar thick) 3.  Material enters airway, remains ABOVE vocal cords and not ejected out: Thin liquids (Level 0) 5.  Material enters airway,  CONTACTS cords and not ejected out: Thin liquids (Level 0) Compensatory Strategies: Compensatory Strategies Compensatory strategies: No   General Information: Caregiver present: No  Diet Prior to this Study: NPO   No data recorded  Respiratory Status: WFL   Supplemental O2: Nasal cannula   History of Recent Intubation: No  Behavior/Cognition: Alert; Cooperative; Pleasant mood Self-Feeding Abilities: Needs assist with self-feeding Baseline vocal quality/speech: Normal No data recorded Volitional Swallow: Able to elicit Exam Limitations: No limitations Goal Planning: Prognosis for improved oropharyngeal function: Fair Barriers to Reach Goals: Severity of deficits; Time post onset No data recorded No data recorded Consulted and agree with results and recommendations: Pt unable/family or caregiver not available; Physician Pain: Pain Assessment Pain Assessment: No/denies pain Pain Score: 0 Pain Location: [place]  rubbed on knees as pt. reports pain  but then denied that there was pain. End of Session: Start Time:SLP Start Time (ACUTE ONLY): 1250 Stop Time: SLP Stop Time (ACUTE ONLY): 1310 Time Calculation:SLP Time Calculation (min) (ACUTE ONLY): 20 min Charges: SLP Evaluations $ SLP Speech Visit: 1 Visit SLP Evaluations $BSS Swallow: 1 Procedure $MBS Swallow: 1 Procedure SLP visit diagnosis: SLP  Visit Diagnosis: Dysphagia, pharyngoesophageal phase (R13.14); Dysphagia, oropharyngeal phase (R13.12) Past Medical History: Past Medical History: Diagnosis Date  Anxiety   Arthritis   Atrophic vaginitis   Cataract   COPD (chronic obstructive pulmonary disease) (HCC)   Depressive disorder, not elsewhere classified   Diabetes mellitus without complication (HCC)   Disorder of bone and cartilage, unspecified   Elevated blood sugar   Emphysema lung (HCC)   Hypertension   Other and unspecified hyperlipidemia   Postmenopausal   Rhinitis  Past Surgical History: Past Surgical History: Procedure Laterality Date  APPENDECTOMY  1957  CATARACT EXTRACTION W/ INTRAOCULAR LENS IMPLANT Bilateral 2005  [place]  COLONOSCOPY N/A 03/08/2014  Procedure: COLONOSCOPY;  Surgeon: Ruby Corporal, MD;  Location: [facility];  Service: Endoscopy;  Laterality: N/A;  EYE SURGERY    JOINT REPLACEMENT    TOTAL HIP ARTHROPLASTY Left 12/27/2013  Procedure: LEFT TOTAL HIP ARTHROPLASTY;  Surgeon: Timothy Ford, MD;  Location: [facility] OR;  Service: Orthopedics;  Laterality: Left; Jacqualine Mater, MA, CCC-SLP Speech Therapy   DG CHEST PORT 1 VIEW Result Date: 09/27/2023 CLINICAL DATA:  Shortness of breath. EXAM: PORTABLE CHEST 1 VIEW COMPARISON:  09/25/2023 FINDINGS: The cardio pericardial silhouette is enlarged. Interstitial markings are diffusely coarsened with chronic features. Patchy opacity in the medial right base compatible with airspace disease seen in this region on CT scan 2 days ago. No substantial pleural effusion. No acute bony abnormality. Telemetry leads overlie the chest. IMPRESSION: Patchy opacity in the medial right base compatible with airspace disease seen in this region on CT scan 2 days ago. Electronically Signed   By: Donnal Fusi M.D.   On: 09/27/2023 09:12   Scheduled Meds:  arformoterol  15 mcg Nebulization Q12H   atenolol   25 mg Oral BID   budesonide  (PULMICORT ) nebulizer solution  0.5 mg Nebulization BID   calcium  carbonate  500 mg of elemental calcium Oral Q breakfast   Chlorhexidine  Gluconate Cloth  6 each Topical Daily   cholecalciferol  2,000 Units Oral Daily   diclofenac  Sodium  2 g Topical QID   escitalopram   20 mg Oral Daily   gabapentin   200 mg Oral TID   guaiFENesin  600 mg Oral BID   insulin  aspart  0-15 Units Subcutaneous TID WC   losartan   25 mg Oral Daily  melatonin  5 mg Oral QHS   pantoprazole (PROTONIX) IV  40 mg Intravenous Q12H   polyethylene glycol  17 g Oral BID   pravastatin   40 mg Oral q1800   senna-docusate  1 tablet Oral BID   tamsulosin   0.4 mg Oral Daily   Continuous Infusions:  cefTRIAXone  (ROCEPHIN )  IV Stopped (09/28/23 1610)   metronidazole  Stopped (09/28/23 1046)    LOS: 3 days   Aura Leeds, DO [facility] Available via Epic secure chat 7am-7pm After these hours, please refer to coverage provider listed on [email or web address] 09/28/2023, 8:53 PM

## 2023-09-28 NOTE — TOC Initial Note (Signed)
 Transition of Care [facility]) - Initial/Assessment Note    Patient Details  Name: [name] [name] MRN: [id number] Date of Birth: [birth year]  [facility] CM/SW Contact:    Tessie Fila, RN Phone Number: 09/28/2023, 11:51 AM  Clinical Narrative:                 Pt is from [facility] as a long term care resident. NCM spoke with pt daughter [name] at [phone]. Pt daughter states that her mother is a long term care resident at [facility]. She uses oxygen as needed and she uses a w/c for ambulation at baseline. PT/OT recommending STR. Pt daughter agreeable to recommendations. TOC following.  Expected Discharge Plan: Long Term Nursing Home Barriers to Discharge: Continued Medical Work up   Patient Goals and CMS Choice Patient states their goals for this hospitalization and ongoing recovery are:: To [facility].gov Compare Post Acute Care list provided to:: Patient Represenative (must comment) (Pt daugher) Choice offered to / list presented to : Adult Children [place] ownership interest in [facility].provided to:: Adult Children    Expected Discharge Plan and Services In-house Referral: Clinical Social Work Discharge Planning Services: CM Consult Post Acute Care Choice: Nursing Home Living arrangements for the past 2 months: Skilled Nursing Facility                 DME Arranged: N/A DME Agency: NA       HH Arranged: NA HH Agency: NA        Prior Living Arrangements/Services Living arrangements for the past 2 months: Skilled Nursing Facility Lives with:: Facility Resident Patient language and need for interpreter reviewed:: Yes Do you feel safe going back to the place where you live?: Yes      Need for Family Participation in Patient Care: Yes (Comment) Care giver support system in place?: Yes (comment) Current home services: Other (comment) (NA) Criminal Activity/Legal Involvement Pertinent to  Current Situation/Hospitalization: No - Comment as needed  Activities of Daily Living   ADL Screening (condition at time of admission) Independently performs ADLs?: No Does the patient have a NEW difficulty with bathing/dressing/toileting/self-feeding that is expected to last >3 days?: No Does the patient have a NEW difficulty with getting in/out of bed, walking, or climbing stairs that is expected to last >3 days?: No Does the patient have a NEW difficulty with communication that is expected to last >3 days?: No Is the patient deaf or have difficulty hearing?: Yes Does the patient have difficulty seeing, even when wearing glasses/contacts?: No Does the patient have difficulty concentrating, remembering, or making decisions?: Yes  Permission Sought/Granted Permission sought to share information with : Family Supports, Magazine features editor Permission granted to share information with : Yes, Verbal Permission Granted  Share Information with NAME: [name] (Daughter)  [phone]  Permission granted to share info w AGENCY: [employer]        Emotional Assessment Appearance:: Other (Comment Required (Unable to assess) Attitude/Demeanor/Rapport: Unable to Assess Affect (typically observed): Unable to Assess Orientation: :  (Unable to assess) Alcohol / Substance Use: Not Applicable Psych Involvement: No (comment)  Admission diagnosis:  COPD exacerbation (HCC) [J44.1] Acute respiratory failure with hypoxia (HCC) [J96.01] Pneumonia of right lower lobe due to infectious organism [J18.9] Patient Active Problem List   Diagnosis Date Noted   Hemoptysis 09/27/2023   COPD exacerbation (HCC) 09/27/2023   Acute respiratory failure with hypoxia (HCC) 09/25/2023   COPD with  acute exacerbation (HCC) 09/25/2023   Pressure injury of skin 09/25/2023   Essential hypertension 09/25/2023   Hypocalcemia 09/25/2023   CAP (community acquired pneumonia) 09/25/2023   Sepsis (HCC)  09/25/2023   Generalized weakness 07/26/2023   Protein-calorie malnutrition, severe 01/14/2023   Rhabdomyolysis 01/12/2023   Brain contusion (HCC) 01/12/2023   Fall at home, initial encounter 01/12/2023   Transaminitis 01/12/2023   UTI (urinary tract infection) 01/12/2023   Lymphedema of both lower extremities 12/23/2021   Hypertension associated with diabetes (HCC) 02/20/2019   Hyperlipidemia associated with type 2 diabetes mellitus (HCC) 02/20/2019   Depression, recurrent (HCC) 02/20/2019   Type 2 diabetes mellitus without complication, without long-term current use of insulin  (HCC) 09/26/2015   COPD (chronic obstructive pulmonary disease) (HCC) 05/29/2015   Dermatographia 05/16/2015   Rectal bleeding 03/07/2014   History of total left hip arthroplasty 01/25/2014   Edema 01/02/2014   Hypoxia 01/02/2014   S/P total hip arthroplasty 12/27/2013   Hyperlipidemia    Anxiety    Depression with anxiety    Rhinitis    Atrophic vaginitis    Postmenopausal    PCP:  Patient, No Pcp Per Pharmacy:   [facility] [place], [place] - [address] [place] [address] Phone: [phone] Fax: [phone]     Social Drivers of Health (SDOH) Social History: SDOH Screenings   Food Insecurity: No Food Insecurity (09/25/2023)  Housing: Low Risk  (09/25/2023)  Transportation Needs: No Transportation Needs (09/25/2023)  Utilities: Not At Risk (09/25/2023)  Depression (PHQ2-9): Medium Risk (04/21/2019)  Financial Resource Strain: Low Risk  (12/08/2017)  Physical Activity: Inactive (12/12/2018)  Social Connections: Moderately Integrated (09/25/2023)  Stress: No Stress Concern Present (12/08/2017)  Tobacco Use: Low Risk  (09/28/2023)   SDOH Interventions:     Readmission Risk Interventions    09/28/2023   11:46 AM 01/12/2023    3:12 PM  Readmission Risk Prevention Plan  Post Dischage Appt  Not Complete  Medication Screening  Complete  Transportation  Screening Complete Complete  PCP or Specialist Appt within 3-5 Days Complete   HRI or Home Care Consult Complete   Social Work Consult for Recovery Care Planning/Counseling Complete   Palliative Care Screening Not Applicable   Medication Review Oceanographer) Complete

## 2023-09-29 ENCOUNTER — Inpatient Hospital Stay (HOSPITAL_COMMUNITY)

## 2023-09-29 DIAGNOSIS — J441 Chronic obstructive pulmonary disease with (acute) exacerbation: Secondary | ICD-10-CM | POA: Diagnosis not present

## 2023-09-29 DIAGNOSIS — F418 Other specified anxiety disorders: Secondary | ICD-10-CM | POA: Diagnosis not present

## 2023-09-29 DIAGNOSIS — J9601 Acute respiratory failure with hypoxia: Secondary | ICD-10-CM | POA: Diagnosis not present

## 2023-09-29 DIAGNOSIS — J189 Pneumonia, unspecified organism: Secondary | ICD-10-CM | POA: Diagnosis not present

## 2023-09-29 LAB — CBC WITH DIFFERENTIAL/PLATELET
Abs Immature Granulocytes: 0.03 10*3/uL (ref 0.00–0.07)
Basophils Absolute: 0 10*3/uL (ref 0.0–0.1)
Basophils Relative: 0 %
Eosinophils Absolute: 0.3 10*3/uL (ref 0.0–0.5)
Eosinophils Relative: 3 %
HCT: 28.4 % — ABNORMAL LOW (ref 36.0–46.0)
Hemoglobin: 9 g/dL — ABNORMAL LOW (ref 12.0–15.0)
Immature Granulocytes: 0 %
Lymphocytes Relative: 11 %
Lymphs Abs: 1 10*3/uL (ref 0.7–4.0)
MCH: 32 pg (ref 26.0–34.0)
MCHC: 31.7 g/dL (ref 30.0–36.0)
MCV: 101.1 fL — ABNORMAL HIGH (ref 80.0–100.0)
Monocytes Absolute: 0.7 10*3/uL (ref 0.1–1.0)
Monocytes Relative: 8 %
Neutro Abs: 7.1 10*3/uL (ref 1.7–7.7)
Neutrophils Relative %: 78 %
Platelets: 285 10*3/uL (ref 150–400)
RBC: 2.81 MIL/uL — ABNORMAL LOW (ref 3.87–5.11)
RDW: 12.5 % (ref 11.5–15.5)
WBC: 9.1 10*3/uL (ref 4.0–10.5)
nRBC: 0.3 % — ABNORMAL HIGH (ref 0.0–0.2)

## 2023-09-29 LAB — OSMOLALITY: Osmolality: 278 mosm/kg (ref 275–295)

## 2023-09-29 LAB — COMPREHENSIVE METABOLIC PANEL WITH GFR
ALT: 14 U/L (ref 0–44)
AST: 21 U/L (ref 15–41)
Albumin: 3 g/dL — ABNORMAL LOW (ref 3.5–5.0)
Alkaline Phosphatase: 51 U/L (ref 38–126)
Anion gap: 5 (ref 5–15)
BUN: 14 mg/dL (ref 8–23)
CO2: 23 mmol/L (ref 22–32)
Calcium: 8 mg/dL — ABNORMAL LOW (ref 8.9–10.3)
Chloride: 99 mmol/L (ref 98–111)
Creatinine, Ser: 0.65 mg/dL (ref 0.44–1.00)
GFR, Estimated: >60 mL/min (ref >60)
Glucose, Bld: 109 mg/dL — ABNORMAL HIGH (ref 70–99)
Potassium: 4 mmol/L (ref 3.5–5.1)
Sodium: 127 mmol/L — ABNORMAL LOW (ref 135–145)
Total Bilirubin: 0.7 mg/dL (ref 0.0–1.2)
Total Protein: 5.5 g/dL — ABNORMAL LOW (ref 6.5–8.1)

## 2023-09-29 LAB — GLUCOSE, CAPILLARY
Glucose-Capillary: 108 mg/dL — ABNORMAL HIGH (ref 70–99)
Glucose-Capillary: 123 mg/dL — ABNORMAL HIGH (ref 70–99)
Glucose-Capillary: 108 mg/dL — ABNORMAL HIGH (ref 70–99)
Glucose-Capillary: 162 mg/dL — ABNORMAL HIGH (ref 70–99)

## 2023-09-29 LAB — TSH: TSH: 0.625 u[IU]/mL (ref 0.350–4.500)

## 2023-09-29 LAB — FOLATE: Folate: 18.6 ng/mL (ref >5.9)

## 2023-09-29 LAB — VITAMIN B12: Vitamin B-12: 509 pg/mL (ref 180–914)

## 2023-09-29 LAB — RETICULOCYTES
Immature Retic Fract: 19.8 % — ABNORMAL HIGH (ref 2.3–15.9)
RBC.: 2.78 MIL/uL — ABNORMAL LOW (ref 3.87–5.11)
Retic Count, Absolute: 69.5 10*3/uL (ref 19.0–186.0)
Retic Ct Pct: 2.5 % (ref 0.4–3.1)

## 2023-09-29 LAB — IRON AND TIBC
Iron: 20 ug/dL — ABNORMAL LOW (ref 28–170)
Saturation Ratios: 8 % — ABNORMAL LOW (ref 10.4–31.8)
TIBC: 245 ug/dL — ABNORMAL LOW (ref 250–450)
UIBC: 225 ug/dL

## 2023-09-29 LAB — PHOSPHORUS: Phosphorus: 3.3 mg/dL (ref 2.5–4.6)

## 2023-09-29 LAB — MAGNESIUM: Magnesium: 2 mg/dL (ref 1.7–2.4)

## 2023-09-29 LAB — SODIUM, URINE, RANDOM: Sodium, Ur: 47 mmol/L

## 2023-09-29 LAB — OSMOLALITY, URINE: Osmolality, Ur: 268 mosm/kg — ABNORMAL LOW (ref 300–900)

## 2023-09-29 LAB — FERRITIN: Ferritin: 83 ng/mL (ref 11–307)

## 2023-09-29 MED ORDER — SALINE SPRAY 0.65 % NA SOLN: 1.0000 | NASAL | Status: DC | PRN

## 2023-09-29 NOTE — Progress Notes (Signed)
 NAME:  [name] [name], MRN:  [id number], DOB:  April 17, 1931, LOS: 4 ADMISSION DATE:  09/25/2023, CONSULTATION DATE:  09/26/2023 REFERRING MD: Gillermo Lack - TRH, CHIEF COMPLAINT:  Hemoptysis   History of Present Illness:  89 year old woman who presented to [facility] 6/14 with SOB, hemoptysis. PMHx significant for HTN, COPD, T2DM, anxiety/depression, UTI, OA.  History is primarily obtained from chart review as patient is a poor historian. Patient reportedly woke up from sleep around 4AM with hemoptysis, EMS found with saturations in the 60s, placed on nasal cannula was given bronchodilators. Patient unable to articulate whether she was coughing prior to onset of symptoms or if she was feeling unwell prior to onset of symptoms. Denies pain or discomfort, denies weight loss, nonsmoker. Noted to have leg swelling. CT Chest was obtained, negative for PE; +Right infrahilar consolidation, possible mass lesion.  PCCM consulted for recommendations.  Pertinent Medical History:   Past Medical History:  Diagnosis Date   Anxiety    Arthritis    Atrophic vaginitis    Cataract    COPD (chronic obstructive pulmonary disease) (HCC)    Depressive disorder, not elsewhere classified    Diabetes mellitus without complication (HCC)    Disorder of bone and cartilage, unspecified    Elevated blood sugar    Emphysema lung (HCC)    Hypertension    Other and unspecified hyperlipidemia    Postmenopausal    Rhinitis    Significant Hospital Events: Including procedures, antibiotic start and stop dates in addition to other pertinent events   6/15 - CT Chest negative for pulmonary embolism, +small masslike consolidation in the infrahilar right lower lobe, trace pleural effusions  Interim History / Subjective:  No significant events overnight Up to chair on rounds, in good spirits Feeling ok overall Coughing episodes x 2 this AM with very faintly blood-tinged sputum, small volume Down to [facility], tolerating well with SpO2  95-98%  Objective:   Blood pressure 106/75, pulse 75, temperature 98.7 F (37.1 C), temperature source Oral, resp. rate 15, height 5' 2 (1.575 m), weight 63.6 kg, SpO2 92%.        Intake/Output Summary (Last 24 hours) at 09/29/2023 1007 Last data filed at 09/29/2023 1000 Gross per 24 hour  Intake 698.87 ml  Output 700 ml  Net -1.13 ml   Filed Weights   09/25/23 0634 09/25/23 1348  Weight: 58.5 kg 63.6 kg   Physical Examination: General: Chronically ill-appearing elderly woman in NAD. Pleasant and conversant, mildly HOH. HEENT: [place]/AT, anicteric sclera, PERRL, dry mucous membranes. Neuro: Awake, oriented x 4. Responds to verbal stimuli. Following commands consistently. Moves all 4 extremities spontaneously. Generalized weakness. CV: RRR, no m/g/r. PULM: Breathing even and unlabored on 2LNC. Lung fields with faint wheeze R upper lung fields, otherwise fairly clear. Diminished at bases. GI: Soft, nontender, nondistended. Normoactive bowel sounds. Extremities: Bilateral chronic-appearing 1+ nonpitting LE edema noted. Skin: Warm/dry, no rashes.  Resolved problem List:   Assessment and Plan:  Acute respiratory failure w/ hypoxia Hemoptysis Possible COPD exacerbation: on symbicort  Infrahilar lesion/consolidation Possible CAP vs. aspiration - Continue ceftriaxone /Flagyl  in the setting of ?aspiration - Follow Cx data - SLP with recommendation for DYS 3 diet - Hold Lovenox  for now, consider resumption once hemoptysis fully resolved; continue SCDs - Supplemental O2 support for SpO2 > 90%, wean as able - Continue Brovana/Pulmicort  - Pulmonary hygiene - Repeat CT in 2-3 months for monitoring/follow up of infrahilar lesion  Depression Anxiety T2DM HTN HLD Hypocalcemia Hyponatremia - Per Primary Team  Best Practice (right click and Reselect all SmartList Selections daily)   Per Primary Team  Signature:   [facility] 09/29/23 10:07 AM  Please see [email or web address] for pager details.  From 7A-7P if no response, please call [phone] After hours, please call ELink [phone]

## 2023-09-29 NOTE — Progress Notes (Signed)
 Speech Language Pathology Treatment: Dysphagia  Patient Details Name: [name] [name] MRN: [id number] DOB: May 01, 1931 Today's Date: 09/29/2023 Time: 8295-6213 SLP Time Calculation (min) (ACUTE ONLY): 31 min  Assessment / Plan / Recommendation Clinical Impression  Pt seen for skilled SLP to address patient's swallowing including reviewing her MBS study and compensation strategies. Patient greeted sitting upright in bed reports she recalls having test but does not review call results. SLP reviewed fluoroscopy loops with patient no family present at this time. Communication is limited by her hearing loss but talking into her left ear is better for communication. In discussion with patient, she does not recall having issues with swallowing but does admit to having some refluxing and some coughing. She mostly states that she does not pay attention to these things. However with further conversations she says it is embarrassing when she is coughing. Observed patient consuming thin water , nectar thick coffee, spaghetti and broccoli. Overt coughing noted with thin water  but also some coughing noted after patient consumed approximately 4 ounces of nectar thick coffee. Largest aspiration risk deemed to be esophageal with backflow. Patient does endorse easier swallowing with liquids that are slightly thicker and does not mind coffee and tea thickened. If patient does not consume enough liquids with nectar thick diet restrictions it may be beneficial to lift these restrictions -even if she is on the [place] water  protocol. Patient reports primarily drinking coffee and tea only. Clinically she did not appear to have difficulties with solids and was able to self-feed without significant oral retention. Using teach back patient was requested to monitor to determine her tolerance today to see if the use of the thickener improves her tolerance of intake and increases her comfort. Make copies of signs and provided patient with  a copy and reviewed compensations. Patient is making progress but uncertain to her level of understanding to her likely baseline dysphagia with potential acute exacerbation given her current illness.     HPI HPI: History of anxiety, depression, osteoarthritis, COPD, type 2 diabetes, hypertension, UTI  Woke up from sleep around 4 AM with hemoptysis, EMS found with saturations in the 60s, placed on nasal cannula was given bronchodilators. CT scan of the chest was obtained- Right infrahilar consolidation, possible mass lesion. Has been observed to cough with drinking.  Pt underwent MBS with findings of oropharyngeal dysphagia with primary concerns for esophageal issues.  Pt seen for follow up regarding her swallow test and compensations.      SLP Plan  Continue with current plan of care          Recommendations  Diet recommendations: Dysphagia 3 (mechanical soft);Nectar-thick liquid [name] water  protocol) Liquids provided via: Cup;Straw Medication Administration: Other (Comment) (Try with nectar or pure) Compensations: Slow rate;Small sips/bites Postural Changes and/or Swallow Maneuvers: Seated upright 90 degrees;Upright 30-60 min after meal                        Dysphagia, oropharyngeal phase (R13.12)     Continue with current plan of care    Maudie Sorrow, MS [facility] SLP Acute Rehab Services Office [phone]  Chantal Comment  09/29/2023, 12:00 PM

## 2023-09-29 NOTE — Plan of Care (Signed)
 Plan of care and goals discussed with patient, all request and questions answered, its noted by this nurse as patient takes her medications one at a time in applesauce patient still coughing and slightly chocking, patient encouraged to slow down, also with nectar thicken liquids patient still coughing and choking, due to patients anxiety her PRN xanax  was given along with tessalon  pearls and hydrocodone  for her cough, lungs sounds are decreased with expiratory wheezing, due to her saturations dropping while sleeping, 2 liters of oxygen via nasal cannula was placed on patient.patient handbook/guide at bedside.   Problem: Education: Goal: Knowledge of disease or condition will improve Outcome: Progressing Goal: Knowledge of the prescribed therapeutic regimen will improve Outcome: Progressing Goal: Individualized Educational Video(s) Outcome: Progressing   Problem: Activity: Goal: Ability to tolerate increased activity will improve Outcome: Progressing Goal: Will verbalize the importance of balancing activity with adequate rest periods Outcome: Progressing   Problem: Respiratory: Goal: Ability to maintain a clear airway will improve Outcome: Progressing Goal: Levels of oxygenation will improve Outcome: Progressing Goal: Ability to maintain adequate ventilation will improve Outcome: Progressing   Problem: Activity: Goal: Ability to tolerate increased activity will improve Outcome: Progressing   Problem: Clinical Measurements: Goal: Ability to maintain a body temperature in the normal range will improve Outcome: Progressing   Problem: Respiratory: Goal: Ability to maintain adequate ventilation will improve Outcome: Progressing Goal: Ability to maintain a clear airway will improve Outcome: Progressing   Problem: Education: Goal: Knowledge of General Education information will improve Description: Including pain rating scale, medication(s)/side effects and non-pharmacologic comfort  measures Outcome: Progressing   Problem: Health Behavior/Discharge Planning: Goal: Ability to manage health-related needs will improve Outcome: Progressing   Problem: Clinical Measurements: Goal: Ability to maintain clinical measurements within normal limits will improve Outcome: Progressing Goal: Will remain free from infection Outcome: Progressing Goal: Diagnostic test results will improve Outcome: Progressing Goal: Respiratory complications will improve Outcome: Progressing Goal: Cardiovascular complication will be avoided Outcome: Progressing   Problem: Activity: Goal: Risk for activity intolerance will decrease Outcome: Progressing   Problem: Nutrition: Goal: Adequate nutrition will be maintained Outcome: Progressing   Problem: Coping: Goal: Level of anxiety will decrease Outcome: Progressing   Problem: Elimination: Goal: Will not experience complications related to bowel motility Outcome: Progressing Goal: Will not experience complications related to urinary retention Outcome: Progressing   Problem: Pain Managment: Goal: General experience of comfort will improve and/or be controlled Outcome: Progressing   Problem: Safety: Goal: Ability to remain free from injury will improve Outcome: Progressing   Problem: Skin Integrity: Goal: Risk for impaired skin integrity will decrease Outcome: Progressing   Problem: Education: Goal: Ability to describe self-care measures that may prevent or decrease complications (Diabetes Survival Skills Education) will improve Outcome: Progressing Goal: Individualized Educational Video(s) Outcome: Progressing   Problem: Coping: Goal: Ability to adjust to condition or change in health will improve Outcome: Progressing   Problem: Fluid Volume: Goal: Ability to maintain a balanced intake and output will improve Outcome: Progressing   Problem: Health Behavior/Discharge Planning: Goal: Ability to identify and utilize available  resources and services will improve Outcome: Progressing Goal: Ability to manage health-related needs will improve Outcome: Progressing   Problem: Metabolic: Goal: Ability to maintain appropriate glucose levels will improve Outcome: Progressing   Problem: Nutritional: Goal: Maintenance of adequate nutrition will improve Outcome: Progressing Goal: Progress toward achieving an optimal weight will improve Outcome: Progressing   Problem: Skin Integrity: Goal: Risk for impaired skin integrity will decrease Outcome: Progressing  Problem: Tissue Perfusion: Goal: Adequacy of tissue perfusion will improve Outcome: Progressing

## 2023-09-29 NOTE — Progress Notes (Addendum)
 [facility]                                                                              [name] [name], is a 89 y.o. female, DOB - 1931-06-14, OZD:[id number] Admit date - 09/25/2023    Outpatient Primary MD for the patient is Patient, No Pcp Per  LOS - 4  days  Chief Complaint  Patient presents with   Shortness of Breath   Hemoptysis       Brief summary   [name] [name] is a 89 y.o. female with medical history significant of anxiety, depression, osteoarthritis, atrophic vaginitis, cataracts, COPD, type 2 diabetes, hypertension, history of rhabdomyolysis, transaminitis, history of UTI who was sent from: [facility] with sudden onset of dyspnea and hemoptysis that woke her up around 0400.  O2 sat was in the 60s when EMS arrived.  She was placed on nasal cannula oxygen, given a DuoNeb, and albuterol  neb and 125 mg of Solu-Medrol . Continued to have Hemoptysis overnight so given TXA nebs. Pulmonary consulted and monitoring closely.    Assessment & Plan    Principal Problem:   Acute respiratory failure with hypoxia (HCC) COPD exacerbation Infrahilar consolidation with hemoptysis, possible community-acquired pneumonia versus aspiration.,  -Continue IV Rocephin , Flagyl  in the setting of possible aspiration - SLP evaluation-> dysphagia 3 diet - Appreciate pulmonology recommendations, hold Lovenox  until hemoptysis fully resolved. - Continue I-S, pulmonary hygiene, Brovana, Pulmicort , Brovana, bronchodilator as - 2D echo showed EF 50 to 55%, no regional wall motion abnormalities, indeterminate left ventricular diastolic parameters - Received Tranexamic Acid 500 mg Nebs x 3 days - Urine strep antigen negative  Active problems Anxiety with depression -Continue Lexapro , Xanax  as needed  Diabetes mellitus type 2, NIDDM -Hold metformin  while inpatient, hemoglobin A1c 6.3 Continue resistant SSI CBG (last 3)  Recent Labs    09/28/23 2110 09/29/23 0742 09/29/23 1149   GLUCAP 81 108* 123*    Essential hypertension - BP stable, continue atenolol , losartan   Hyperlipidemia - Continue statin  NAG metabolic acidosis - Resolved  Hyponatremia - Likely may have underlying SIADH due to pulmonary issue - Obtain serum osmolality, urine osmolality, UNa, TSH  Macrocytic anemia - Initially normocytic now MCV trended up to 103.4 - Has mild iron deficiency, B12, folate within normal limits    Estimated body mass index is 25.65 kg/m as calculated from the following:   Height as of this encounter: 5' 2 (1.575 m).   Weight as of this encounter: 63.6 kg.  Code Status: DNR DVT Prophylaxis:  Place and maintain sequential compression device Start: 09/26/23 0130   Level of Care: Level of care: Stepdown Family Communication: Updated patient's daughter on phone today Disposition Plan:      Remains inpatient appropriate:      Procedures:    Consultants:   PCCM  Antimicrobials:   Anti-infectives (From admission, onward)    Start     Dose/Rate Route Frequency Ordered Stop   09/27/23 1100  metroNIDAZOLE  (FLAGYL ) IVPB 500 mg        500 mg 100 mL/hr over 60 Minutes Intravenous 2 times daily 09/27/23 1013     09/26/23  0800  cefTRIAXone  (ROCEPHIN ) 2 g in sodium chloride  0.9 % 100 mL IVPB        2 g 200 mL/hr over 30 Minutes Intravenous Every 24 hours 09/25/23 1318 09/29/23 0841   09/26/23 0800  azithromycin  (ZITHROMAX ) 500 mg in sodium chloride  0.9 % 250 mL IVPB  Status:  Discontinued        500 mg 250 mL/hr over 60 Minutes Intravenous Every 24 hours 09/25/23 1318 09/27/23 1013   09/25/23 0830  cefTRIAXone  (ROCEPHIN ) 1 g in sodium chloride  0.9 % 100 mL IVPB        1 g 200 mL/hr over 30 Minutes Intravenous  Once 09/25/23 0824 09/25/23 1002   09/25/23 0830  azithromycin  (ZITHROMAX ) 500 mg in sodium chloride  0.9 % 250 mL IVPB        500 mg 250 mL/hr over 60 Minutes Intravenous  Once 09/25/23 0824 09/25/23 1042          Medications  arformoterol   15 mcg Nebulization Q12H   atenolol   25 mg Oral BID   budesonide  (PULMICORT ) nebulizer solution  0.5 mg Nebulization BID   calcium carbonate  500 mg of elemental calcium Oral Q breakfast   Chlorhexidine  Gluconate Cloth  6 each Topical Daily   cholecalciferol  2,000 Units Oral Daily   diclofenac  Sodium  2 g Topical QID   escitalopram   20 mg Oral Daily   gabapentin   200 mg Oral TID   guaiFENesin  600 mg Oral BID   insulin  aspart  0-15 Units Subcutaneous TID WC   losartan   25 mg Oral Daily   melatonin  5 mg Oral QHS   pantoprazole (PROTONIX) IV  40 mg Intravenous Q12H   polyethylene glycol  17 g Oral BID   pravastatin   40 mg Oral q1800   senna-docusate  1 tablet Oral BID   tamsulosin   0.4 mg Oral Daily      Subjective:   [name] [name] was seen and examined today.  Still has some coughing episodes with blood-tinged sputum, on 2 L O2 via [place].  No acute chest pain or shortness of breath, fevers, nausea vomiting or abdominal pain.    Objective:   Vitals:   09/29/23 1000 09/29/23 1100 09/29/23 1145 09/29/23 1208  BP: 106/75 (!) 160/66  (!) 141/46  Pulse: 75 66  68  Resp: 15 19  18   Temp:   98.7 F (37.1 C)   TempSrc:   Oral   SpO2: 92% 99%  100%  Weight:      Height:        Intake/Output Summary (Last 24 hours) at 09/29/2023 1342 Last data filed at 09/29/2023 1035 Gross per 24 hour  Intake 718.08 ml  Output 700 ml  Net 18.08 ml     Wt Readings from Last 3 Encounters:  09/25/23 63.6 kg  09/14/23 58.5 kg  07/26/23 58.5 kg     Exam General: Alert and oriented, HOH, chronically ill-appearing Cardiovascular: S1 S2 auscultated,  RRR Respiratory: Bilateral wheezing Gastrointestinal: Soft, nontender, nondistended, + bowel sounds Ext: 1+ nonpitting pedal edema bilaterally Neuro: Strength 5/5 upper and lower extremities bilaterally Psych: Normal affect     Data Reviewed:  I have personally reviewed following labs    CBC Lab Results  Component Value Date   WBC 9.1  09/29/2023   RBC 2.78 (L) 09/29/2023   RBC 2.81 (L) 09/29/2023   HGB 9.0 (L) 09/29/2023   HCT 28.4 (L) 09/29/2023   MCV 101.1 (H) 09/29/2023  MCH 32.0 09/29/2023   PLT 285 09/29/2023   MCHC 31.7 09/29/2023   RDW 12.5 09/29/2023   LYMPHSABS 1.0 09/29/2023   MONOABS 0.7 09/29/2023   EOSABS 0.3 09/29/2023   BASOSABS 0.0 09/29/2023     Last metabolic panel Lab Results  Component Value Date   NA 127 (L) 09/29/2023   K 4.0 09/29/2023   CL 99 09/29/2023   CO2 23 09/29/2023   BUN 14 09/29/2023   CREATININE 0.65 09/29/2023   GLUCOSE 109 (H) 09/29/2023   GFRNONAA >60 09/29/2023   GFRAA >60 04/28/2019   CALCIUM 8.0 (L) 09/29/2023   PHOS 3.3 09/29/2023   PROT 5.5 (L) 09/29/2023   ALBUMIN 3.0 (L) 09/29/2023   LABGLOB 2.4 04/21/2019   AGRATIO 1.9 04/21/2019   BILITOT 0.7 09/29/2023   ALKPHOS 51 09/29/2023   AST 21 09/29/2023   ALT 14 09/29/2023   ANIONGAP 5 09/29/2023    CBG (last 3)  Recent Labs    09/28/23 2110 09/29/23 0742 09/29/23 1149  GLUCAP 81 108* 123*      Coagulation Profile: No results for input(s): INR, PROTIME in the last 168 hours.   Radiology Studies: I have personally reviewed the imaging studies  DG CHEST PORT 1 VIEW Result Date: 09/29/2023 CLINICAL DATA:  Shortness of breath. EXAM: PORTABLE CHEST 1 VIEW COMPARISON:  09/28/2023 FINDINGS: The cardio pericardial silhouette is enlarged. Interval increase in diffuse interstitial opacity concerning for edema. Persistent bibasilar atelectasis/infiltrate with probable layering small effusions. No acute bony abnormality. Telemetry leads overlie the chest. IMPRESSION: 1. Interval increase in diffuse interstitial opacity concerning for edema. 2. Persistent bibasilar atelectasis/infiltrate with probable layering small effusions. Electronically Signed   By: Donnal Fusi M.D.   On: 09/29/2023 09:40   DG CHEST PORT 1 VIEW Result Date: 09/28/2023 CLINICAL DATA:  Shortness of breath. EXAM: PORTABLE CHEST 1 VIEW  COMPARISON:  09/27/2023 FINDINGS: The cardio pericardial silhouette is enlarged. Diffuse interstitial opacity is stable. Interval progression of bibasilar airspace opacity compatible with atelectasis or infection. New tiny bilateral pleural effusions. No acute bony abnormality. Telemetry leads overlie the chest. IMPRESSION: 1. Interval progression of bibasilar airspace opacity compatible with atelectasis or infection. 2. New tiny bilateral pleural effusions. Electronically Signed   By: Donnal Fusi M.D.   On: 09/28/2023 07:48       Elaynah Virginia M.D. [facility] 09/29/2023, 1:42 PM  Available via Epic secure chat 7am-7pm After 7 pm, please refer to night coverage provider listed on amion.

## 2023-09-29 NOTE — Plan of Care (Signed)
  Problem: Activity: Goal: Ability to tolerate increased activity will improve Outcome: Progressing   Problem: Respiratory: Goal: Levels of oxygenation will improve Outcome: Progressing   Problem: Activity: Goal: Ability to tolerate increased activity will improve Outcome: Progressing   Problem: Respiratory: Goal: Ability to maintain adequate ventilation will improve Outcome: Progressing Goal: Ability to maintain a clear airway will improve Outcome: Progressing   Problem: Activity: Goal: Risk for activity intolerance will decrease Outcome: Progressing

## 2023-09-29 NOTE — Progress Notes (Signed)
 Physical Therapy Treatment Patient Details Name: [name] [name] MRN: [id number] DOB: 08/28/1931 Today's Date: 09/29/2023   History of Present Illness Patient is a 89 year old female who presented on 6/14 with O2 saturation in 60s. Patient was admitted with sepsis, COPD exacerbation, and acute respiratory failure with hypoxia. WUJ:[id number], depression, osteoarthritis, atrophic vaginitis, cataracts, COPD, type 2 diabetes, hypertension, history of rhabdomyolysis, transaminitis, history of UTI    PT Comments  Pt seen in ICU RM# [id number] AxO x 3 pleasant and willing.  VERY HOH.  Assisted OOB to recliner via SPS Bear Hug.  Incont BM.  Assisted with peri care/hygiene then positioned to comfort in recliner.   Pt is a long term  resident at [facility]    If plan is discharge home, recommend the following: Two people to help with walking and/or transfers;A lot of help with bathing/dressing/bathroom;Assist for transportation   Can travel by private vehicle     No  Equipment Recommendations  None recommended by PT    Recommendations for Other Services       Precautions / Restrictions Precautions Precautions: Fall Precaution/Restrictions Comments: hears best out of L ear Restrictions Weight Bearing Restrictions Per Provider Order: No     Mobility  Bed Mobility Overal bed mobility: Needs Assistance Bed Mobility: Supine to Sit     Supine to sit: +2 for physical assistance, +2 for safety/equipment, HOB elevated     General bed mobility comments: required increased assist and time to transition to EOB.  Poor sitting balance required Min Assist/support.    Transfers Overall transfer level: Needs assistance Equipment used: None Transfers: Sit to/from Stand Sit to Stand: +2 physical assistance, +2 safety/equipment, Max assist           General transfer comment: assisted from EOB to recliner 1/4 pivot bear hug then + 2 side by side assist to scoot to back od recliner.   Position to comfort. Transfer via Lift Equipment: Stedy  Ambulation/Gait               General Gait Details: non amb at baseline   Optometrist     Tilt Bed    Modified Rankin (Stroke Patients Only)       Balance                                            Communication Communication Communication: Impaired Factors Affecting Communication: Hearing impaired;Other (comment)  Cognition Arousal: Alert Behavior During Therapy: WFL for tasks assessed/performed                           PT - Cognition Comments: AxO x 3 pleasant Lady and willing to try Following commands: Impaired Following commands impaired: Follows one step commands inconsistently    Cueing Cueing Techniques: Verbal cues, Gestural cues  Exercises      General Comments        Pertinent Vitals/Pain Pain Assessment Pain Assessment: Faces Faces Pain Scale: Hurts a little bit Pain Location: general Pain Descriptors / Indicators: Aching Pain Intervention(s): Monitored during session    Home Living                          Prior Function  PT Goals (current goals can now be found in the care plan section) Progress towards PT goals: Progressing toward goals    Frequency    Min 2X/week      PT Plan      Co-evaluation              AM-PAC PT 6 Clicks Mobility   Outcome Measure  Help needed turning from your back to your side while in a flat bed without using bedrails?: A Lot Help needed moving from lying on your back to sitting on the side of a flat bed without using bedrails?: A Lot Help needed moving to and from a bed to a chair (including a wheelchair)?: Total Help needed standing up from a chair using your arms (e.g., wheelchair or bedside chair)?: Total Help needed to walk in hospital room?: Total Help needed climbing 3-5 steps with a railing? : Total 6 Click Score: 8    End of Session  Equipment Utilized During Treatment: Gait belt Activity Tolerance: Patient tolerated treatment well Patient left: in chair;with call bell/phone within reach;with chair alarm set Nurse Communication: Mobility status;Need for lift equipment PT Visit Diagnosis: Unsteadiness on feet (R26.81);Muscle weakness (generalized) (M62.81);Difficulty in walking, not elsewhere classified (R26.2);History of falling (Z91.81);Other abnormalities of gait and mobility (R26.89)     Time: 1005-1020 PT Time Calculation (min) (ACUTE ONLY): 15 min  Charges:    $Therapeutic Activity: 8-22 mins PT General Charges $$ ACUTE PT VISIT: 1 Visit                     Bess Broody  PTA Acute  Rehabilitation Services Office M-F          [phone]

## 2023-09-30 DIAGNOSIS — R4589 Other symptoms and signs involving emotional state: Secondary | ICD-10-CM

## 2023-09-30 DIAGNOSIS — Z515 Encounter for palliative care: Secondary | ICD-10-CM

## 2023-09-30 DIAGNOSIS — J189 Pneumonia, unspecified organism: Principal | ICD-10-CM

## 2023-09-30 DIAGNOSIS — J9601 Acute respiratory failure with hypoxia: Secondary | ICD-10-CM | POA: Diagnosis not present

## 2023-09-30 DIAGNOSIS — Z7189 Other specified counseling: Secondary | ICD-10-CM

## 2023-09-30 DIAGNOSIS — J441 Chronic obstructive pulmonary disease with (acute) exacerbation: Secondary | ICD-10-CM | POA: Diagnosis not present

## 2023-09-30 LAB — GLUCOSE, CAPILLARY
Glucose-Capillary: 111 mg/dL — ABNORMAL HIGH (ref 70–99)
Glucose-Capillary: 143 mg/dL — ABNORMAL HIGH (ref 70–99)
Glucose-Capillary: 106 mg/dL — ABNORMAL HIGH (ref 70–99)

## 2023-09-30 LAB — CBC WITH DIFFERENTIAL/PLATELET
Abs Immature Granulocytes: 0.02 10*3/uL (ref 0.00–0.07)
Basophils Absolute: 0 10*3/uL (ref 0.0–0.1)
Basophils Relative: 0 %
Eosinophils Absolute: 0.3 10*3/uL (ref 0.0–0.5)
Eosinophils Relative: 3 %
HCT: 29.3 % — ABNORMAL LOW (ref 36.0–46.0)
Hemoglobin: 9.3 g/dL — ABNORMAL LOW (ref 12.0–15.0)
Immature Granulocytes: 0 %
Lymphocytes Relative: 18 %
Lymphs Abs: 1.7 10*3/uL (ref 0.7–4.0)
MCH: 32 pg (ref 26.0–34.0)
MCHC: 31.7 g/dL (ref 30.0–36.0)
MCV: 100.7 fL — ABNORMAL HIGH (ref 80.0–100.0)
Monocytes Absolute: 0.8 10*3/uL (ref 0.1–1.0)
Monocytes Relative: 9 %
Neutro Abs: 6.5 10*3/uL (ref 1.7–7.7)
Neutrophils Relative %: 70 %
Platelets: 321 10*3/uL (ref 150–400)
RBC: 2.91 MIL/uL — ABNORMAL LOW (ref 3.87–5.11)
RDW: 12.3 % (ref 11.5–15.5)
WBC: 9.3 10*3/uL (ref 4.0–10.5)
nRBC: 0 % (ref 0.0–0.2)

## 2023-09-30 LAB — CULTURE, BLOOD (ROUTINE X 2)
Culture: NO GROWTH
Culture: NO GROWTH

## 2023-09-30 LAB — RENAL FUNCTION PANEL
Albumin: 2.9 g/dL — ABNORMAL LOW (ref 3.5–5.0)
Anion gap: 7 (ref 5–15)
BUN: 8 mg/dL (ref 8–23)
CO2: 22 mmol/L (ref 22–32)
Calcium: 8 mg/dL — ABNORMAL LOW (ref 8.9–10.3)
Chloride: 98 mmol/L (ref 98–111)
Creatinine, Ser: 0.59 mg/dL (ref 0.44–1.00)
GFR, Estimated: >60 mL/min (ref >60)
Glucose, Bld: 110 mg/dL — ABNORMAL HIGH (ref 70–99)
Phosphorus: 3.2 mg/dL (ref 2.5–4.6)
Potassium: 4.1 mmol/L (ref 3.5–5.1)
Sodium: 127 mmol/L — ABNORMAL LOW (ref 135–145)

## 2023-09-30 MED ORDER — PANTOPRAZOLE SODIUM 40 MG PO TBEC
40.0000 mg | DELAYED_RELEASE_TABLET | Freq: Two times a day (BID) | ORAL | Status: DC
  Administered 2023-10-01: 40 mg via ORAL
  Filled 2023-09-30 (×2): qty 1

## 2023-09-30 NOTE — Consult Note (Signed)
 Consultation Note Date: 09/30/2023   Patient Name: [name] [name]  DOB: 1931/06/06  MRN: [id number]  Age / Sex: 89 y.o., female   PCP: Patient, No Pcp Per Referring Physician: Loma Rising, MD  Reason for Consultation: Establishing goals of care     Chief Complaint/History of Present Illness:   Patient is an 89 year old female with a past medical history of anxiety, depression, osteoarthritis, atrophic vaginitis, cataracts, COPD, type 2 diabetes mellitus, hypertension, history of rhabdomyolysis, transaminitis, and history of UTIs who was sent to hospital on 09/25/2023 from long-term care resident for management of dyspnea and hemoptysis.  During hospitalization patient has received management for acute hypoxic respiratory failure secondary to COPD exacerbation.  Patient also noted to have infrahilar consolidation with hemoptysis which is considered to be possibly Versus aspiration.  Palliative medicine team consulted to assist with complex medical decision making.  Extensive review of EMR prior to presenting to bedside.  Reviewed recent documentation from PCCM, hospitalist, SLP, and PT.  Patient receiving dysphagia 3 diet for assistance with swallowing.  Patient noted to be improving from a respiratory standpoint and would likely be transferred to regular floor room today.  PCCM team recommending follow-up CT in 2-3 months for surveillance of infrahilar lesion. Reviewed recent BMP noting sodium 127, potassium 4.1, creatinine 0.59, BUN 8, and GFR > 60. Personally reviewed patient's recent chest x-ray noting possibility of increasing edema.  Presents to bedside to see patient.  Patient resting comfortably in bed.  Appeared comfortable without concerns of increased work of breathing.  No visitors present at bedside.  Discussed care with bedside RN for updates.  Bedside RN notes patient is confused at times.  RN also noted that patient's daughter had been in earlier in the day.  Noted would reach  out to daughter to discuss care. Discussed care with hospitalist for medical updates as well.  Able to call patient's daughter, [name] [name].  Introduced myself as a member of the palliative medicine team my role in patient's medical journey.  Spent time discussing patient's medical course at this time.  Patient has slowly improved from a respiratory standpoint and will likely be transferred to floor room today.  Reviewed recommendations from PCCM to follow-up with CT in 2-3 months.  Noted continuing appropriate medical management for possibility of CAP versus aspiration.  Discussed SLP recommendations for dysphagia 3 diet.  Spent time normalizing how muscles can weaken particularly with swallowing as on ages.  Spent time learning about patient's life outside of hospital.  Patient is a long-term care resident [facility].  Daughter noted that patient enjoyed things like attending church and interacting with friends and family. Inquired if patient had ever completed ACP documentation.  Daughter confirmed that patient is DNR/DNI.  Daughter noted that patient never completed documentation naming HCPOA.  Confirmed that patient is not married, spouse is deceased.  Patient only has 2 children who are the daughters [name] [name] and [name] [name].  Discussed based on [place]  regulations, if patient does not have HCPOA and cannot make decisions for herself, medical decision making would fall to both the daughters.  [name] [name] acknowledged this. Spent time discussing care planning moving forward.  Normalized that patient may have new baseline that is lower from when she previously came into the hospital.  Discussed importance of continuing conversations moving forward about what patient would and would not want regarding her medical care.  Discussed that if patient is having worsening medical status, particularly if having aspiration events, would be important to  consider if patient would want to continue coming back to the  hospital knowing that aspiration itself cannot be fixed.  Discussed that if patient reaches a point where she would like to focus on her comfort and no longer return to the hospital receive medical interventions, may be appropriate to consider hospice at that time.  Noted at this time would place referral to [facility] to assist with outpatient palliative medicine referral to continue conversations moving forward at patient's long-term care resident.  Daughter agreed with this plan.  Spent time answering questions as able.  Noted palliative medicine team to be available if needed.  Primary Diagnoses  Present on Admission:  Acute respiratory failure with hypoxia (HCC)  Depression with anxiety  COPD with acute exacerbation (HCC)  Pressure injury of skin  Essential hypertension  Hypocalcemia  CAP (community acquired pneumonia)  Sepsis (HCC)  Hyperlipidemia   Past Medical History:  Diagnosis Date   Anxiety    Arthritis    Atrophic vaginitis    Cataract    COPD (chronic obstructive pulmonary disease) (HCC)    Depressive disorder, not elsewhere classified    Diabetes mellitus without complication (HCC)    Disorder of bone and cartilage, unspecified    Elevated blood sugar    Emphysema lung (HCC)    Hypertension    Other and unspecified hyperlipidemia    Postmenopausal    Rhinitis    Social History   Socioeconomic History   Marital status: Widowed    Spouse name: Not on file   Number of children: 2   Years of education: 10   Highest education level: 10th grade  Occupational History   Occupation: retired    Comment: Neurosurgeon and worked at [employer]  Tobacco Use   Smoking status: Never   Smokeless tobacco: Never  Vaping Use   Vaping status: Never Used  Substance and Sexual Activity   Alcohol use: No    Alcohol/week: 0.0 standard drinks of alcohol   Drug use: No   Sexual activity: Not Currently  Other Topics Concern   Not on file  Social History Narrative   Not on file    Social Drivers of Health   Financial Resource Strain: Low Risk  (12/08/2017)   Overall Financial Resource Strain (CARDIA)    Difficulty of Paying Living Expenses: Not hard at all  Food Insecurity: No Food Insecurity (09/25/2023)   Hunger Vital Sign    Worried About Running Out of Food in the Last Year: Never true    Ran Out of Food in the Last Year: Never true  Transportation Needs: No Transportation Needs (09/25/2023)   PRAPARE - Administrator, Civil Service (Medical): No    Lack of Transportation (Non-Medical): No  Physical Activity: Inactive (12/12/2018)   Exercise Vital Sign    Days of Exercise per Week: 0 days    Minutes of Exercise per Session: 0 min  Stress: No Stress Concern Present (12/08/2017)   [employer] of Occupational Health - Occupational Stress Questionnaire    Feeling of Stress : Only a little  Social Connections: Moderately Integrated (09/25/2023)   Social Connection and Isolation Panel    Frequency of Communication with Friends and Family: More than three times a week    Frequency of Social Gatherings with Friends and Family: More than three times a week    Attends Religious Services: More than 4 times per year    Active Member of [employer] or Organizations: Yes    Attends [employer]  or Organization Meetings: More than 4 times per year    Marital Status: Widowed   Family History  Problem Relation Age of Onset   Arthritis Mother    COPD Father    Diabetes Father    Aneurysm Sister    Diabetes Sister    Diabetes Sister    Hypertension Sister    COPD Daughter    Fibromyalgia Daughter    Healthy Daughter    Scheduled Meds:  arformoterol  15 mcg Nebulization Q12H   atenolol   25 mg Oral BID   budesonide  (PULMICORT ) nebulizer solution  0.5 mg Nebulization BID   calcium carbonate  500 mg of elemental calcium Oral Q breakfast   Chlorhexidine  Gluconate Cloth  6 each Topical Daily   cholecalciferol  2,000 Units Oral Daily   diclofenac  Sodium  2 g  Topical QID   escitalopram   20 mg Oral Daily   gabapentin   200 mg Oral TID   guaiFENesin  600 mg Oral BID   insulin  aspart  0-15 Units Subcutaneous TID WC   losartan   25 mg Oral Daily   melatonin  5 mg Oral QHS   pantoprazole (PROTONIX) IV  40 mg Intravenous Q12H   polyethylene glycol  17 g Oral BID   pravastatin   40 mg Oral q1800   senna-docusate  1 tablet Oral BID   tamsulosin   0.4 mg Oral Daily   Continuous Infusions:  metronidazole  500 mg (09/30/23 0836)   PRN Meds:.acetaminophen  **OR** acetaminophen , ALPRAZolam , benzonatate , bisacodyl , hydrALAZINE, HYDROcodone  bit-homatropine, ondansetron  **OR** ondansetron  (ZOFRAN ) IV, sodium chloride  Allergies  Allergen Reactions   Lisinopril  Cough   Penicillins Other (See Comments)    Can't remember   Betadine [Povidone Iodine] Rash   Hydrogen Peroxide Rash   Sertraline Hcl Other (See Comments)    Sweating and mouth irritation    Sulfa Antibiotics Nausea And Vomiting   CBC:    Component Value Date/Time   WBC 9.3 09/30/2023 0326   HGB 9.3 (L) 09/30/2023 0326   HGB 14.7 02/03/2019 0943   HCT 29.3 (L) 09/30/2023 0326   HCT 43.4 02/03/2019 0943   PLT 321 09/30/2023 0326   PLT 324 02/03/2019 0943   MCV 100.7 (H) 09/30/2023 0326   MCV 90 02/03/2019 0943   NEUTROABS 6.5 09/30/2023 0326   NEUTROABS 4.2 02/03/2019 0943   LYMPHSABS 1.7 09/30/2023 0326   LYMPHSABS 2.3 02/03/2019 0943   MONOABS 0.8 09/30/2023 0326   EOSABS 0.3 09/30/2023 0326   EOSABS 0.1 02/03/2019 0943   BASOSABS 0.0 09/30/2023 0326   BASOSABS 0.0 02/03/2019 0943   Comprehensive Metabolic Panel:    Component Value Date/Time   NA 127 (L) 09/30/2023 0326   NA 136 04/21/2019 1552   K 4.1 09/30/2023 0326   CL 98 09/30/2023 0326   CO2 22 09/30/2023 0326   BUN 8 09/30/2023 0326   BUN 8 04/21/2019 1552   CREATININE 0.59 09/30/2023 0326   CREATININE 0.88 12/26/2012 1634   GLUCOSE 110 (H) 09/30/2023 0326   CALCIUM 8.0 (L) 09/30/2023 0326   AST 21 09/29/2023 0320    ALT 14 09/29/2023 0320   ALKPHOS 51 09/29/2023 0320   BILITOT 0.7 09/29/2023 0320   BILITOT 0.4 04/21/2019 1552   PROT 5.5 (L) 09/29/2023 0320   PROT 6.9 04/21/2019 1552   ALBUMIN 2.9 (L) 09/30/2023 0326   ALBUMIN 4.5 04/21/2019 1552    Physical Exam: Vital Signs: BP (!) 141/47 (BP Location: Right Arm)   Pulse (!) 56   Temp 97.7  F (36.5 C) (Oral)   Resp 14   Ht 5' 2 (1.575 m)   Wt 65.3 kg   SpO2 100%   BMI 26.33 kg/m  SpO2: SpO2: 100 % O2 Device: O2 Device: Nasal Cannula O2 Flow Rate: O2 Flow Rate (L/min): 2 L/min Intake/output summary:  Intake/Output Summary (Last 24 hours) at 09/30/2023 0854 Last data filed at 09/29/2023 2215 Gross per 24 hour  Intake 879.01 ml  Output 925 ml  Net -45.99 ml   LBM: Last BM Date : 09/29/23 Baseline Weight: Weight: 58.5 kg Most recent weight: Weight: 65.3 kg  General: NAD, resting comfortably, chronically ill-appearing, elderly Cardiovascular: RRR Respiratory: no increased work of breathing noted, not in respiratory distress Neuro: Resting comfortably         Palliative Performance Scale: 40%              Additional Data Reviewed: Recent Labs    09/29/23 0320 09/30/23 0326  WBC 9.1 9.3  HGB 9.0* 9.3*  PLT 285 321  NA 127* 127*  BUN 14 8  CREATININE 0.65 0.59    Imaging: DG CHEST PORT 1 VIEW CLINICAL DATA:  Shortness of breath.  EXAM: PORTABLE CHEST 1 VIEW  COMPARISON:  09/28/2023  FINDINGS: The cardio pericardial silhouette is enlarged. Interval increase in diffuse interstitial opacity concerning for edema. Persistent bibasilar atelectasis/infiltrate with probable layering small effusions. No acute bony abnormality. Telemetry leads overlie the chest.  IMPRESSION: 1. Interval increase in diffuse interstitial opacity concerning for edema. 2. Persistent bibasilar atelectasis/infiltrate with probable layering small effusions.  Electronically Signed   By: Donnal Fusi M.D.   On: 09/29/2023 09:40    I  personally reviewed recent imaging.   Palliative Care Assessment and Plan Summary of Established Goals of Care and Medical Treatment Preferences   Patient is an 89 year old female with a past medical history of anxiety, depression, osteoarthritis, atrophic vaginitis, cataracts, COPD, type 2 diabetes mellitus, hypertension, history of rhabdomyolysis, transaminitis, and history of UTIs who was sent to hospital on 09/25/2023 from long-term care resident for management of dyspnea and hemoptysis.  During hospitalization patient has received management for acute hypoxic respiratory failure secondary to COPD exacerbation.  Patient also noted to have infrahilar consolidation with hemoptysis which is considered to be possibly Versus aspiration.  Palliative medicine team consulted to assist with complex medical decision making.  # Complex medical decision making/goals of care  -Visited with patient who was resting and confused at times.  Spoke with patient's daughter, [name] [name], via phone as detailed above in HPI.  Daughter confirmed patient is DNR/DNI.  Provided medical updates regarding care at this time.  Plan is for [facility] to [facility] where she is a long-term care resident.  Spent time discussing that should patient worsen or requiring rehospitalization's, would be important to continue discussing patient's quality of life and what brings her joy.  Noted if patient is not wanting to return to the hospital or proceed with aggressive medical interventions, would then be appropriate to have hospice involved at long-term care facility.  Daughter acknowledges.  At this time we will refer patient for outpatient palliative to continue conversations moving forward and consider transition to hospice when appropriate.  -  Code Status: Limited: Do not attempt resuscitation (DNR) -DNR-LIMITED -Do Not Intubate/DNI    # Psycho-social/Spiritual Support:  - Support System: Daughters- Tiburcio Fly  #  Discharge Planning:  Skilled Nursing Facility for rehab with Palliative care service follow-up - [facility] consult for outpatient  palliative medicine referral.  Thank you for allowing the palliative care team to participate in the care [name].  Barnett Libel, DO Palliative Care Provider PMT # [phone]  If patient remains symptomatic despite maximum doses, please call PMT at [phone] between 0700 and 1900. Outside of these hours, please call attending, as PMT does not have night coverage.  Personally spent 80 minutes in patient care including extensive chart review (labs, imaging, progress/consult notes, vital signs), medically appropraite exam, discussed with treatment team, education to patient, family, and staff, documenting clinical information, medication review and management, coordination of care, and available advanced directive documents.

## 2023-09-30 NOTE — Progress Notes (Signed)
 NAME:  [name] [name], MRN:  [id number], DOB:  1931/11/06, LOS: 5 ADMISSION DATE:  09/25/2023, CONSULTATION DATE:  09/26/2023 REFERRING MD: Gillermo Lack - TRH, CHIEF COMPLAINT:  Hemoptysis   History of Present Illness:  89 year old woman who presented to [facility] 6/14 with SOB, hemoptysis. PMHx significant for HTN, COPD, T2DM, anxiety/depression, UTI, OA.  History is primarily obtained from chart review as patient is a poor historian. Patient reportedly woke up from sleep around 4AM with hemoptysis, EMS found with saturations in the 60s, placed on nasal cannula was given bronchodilators. Patient unable to articulate whether she was coughing prior to onset of symptoms or if she was feeling unwell prior to onset of symptoms. Denies pain or discomfort, denies weight loss, nonsmoker. Noted to have leg swelling. CT Chest was obtained, negative for PE; +Right infrahilar consolidation, possible mass lesion.  PCCM consulted for recommendations.  Pertinent Medical History:   Past Medical History:  Diagnosis Date   Anxiety    Arthritis    Atrophic vaginitis    Cataract    COPD (chronic obstructive pulmonary disease) (HCC)    Depressive disorder, not elsewhere classified    Diabetes mellitus without complication (HCC)    Disorder of bone and cartilage, unspecified    Elevated blood sugar    Emphysema lung (HCC)    Hypertension    Other and unspecified hyperlipidemia    Postmenopausal    Rhinitis    Significant Hospital Events: Including procedures, antibiotic start and stop dates in addition to other pertinent events   6/15 - CT Chest negative for pulmonary embolism, +small masslike consolidation in the infrahilar right lower lobe, trace pleural effusions  Interim History / Subjective:  No significant events overnight Feeling ok today No coughing episodes/sputum this AM One episode of light pink-tinged sputum overnight Remains on [facility] Appetite is improving Mouth/nose feels dry  Objective:    Blood pressure (!) 159/60, pulse (!) 57, temperature 97.7 F (36.5 C), temperature source Oral, resp. rate 18, height 5' 2 (1.575 m), weight 65.3 kg, SpO2 100%.    FiO2 (%):  [21 %-28 %] 28 %   Intake/Output Summary (Last 24 hours) at 09/30/2023 0735 Last data filed at 09/29/2023 2215 Gross per 24 hour  Intake 879.01 ml  Output 925 ml  Net -45.99 ml   Filed Weights   09/25/23 1348 09/29/23 2200 09/30/23 0500  Weight: 63.6 kg 65.4 kg 65.3 kg   Physical Examination: General: Chronically ill-appearing elderly woman in NAD. HEENT: [place]/AT, anicteric sclera, PERRL, dry mucous membranes. Neuro: Awake, oriented x 3-4. Responds to verbal stimuli. Following commands consistently. Moves all 4 extremities spontaneously. Generalized weakness. CV: RRR, no m/g/r. PULM: Breathing even and unlabored on 2LNC. Lung fields CTAB, no wheezing. GI: Soft, nontender, nondistended. Normoactive bowel sounds. Extremities: Bilateral chronic-appearing 1+ symmetric LE edema noted. Skin: Warm/dry, no rashes.  Resolved problem List:   Assessment and Plan:  Acute respiratory failure w/ hypoxia Hemoptysis Possible COPD exacerbation: on symbicort  Infrahilar lesion/consolidation Possible CAP vs. Aspiration - Continue ceftriaxone /Flagyl  for ?aspiration, likely can complete 7-day total course - Follow Cx data, BCx 6/14 NG - Continue DYS 3 diet per SLP recommendations - Can resume Lovenox  for DVT ppx - Supplemental O2 support for SpO2 >90%, wean as able - Continue Brovana/Pulmicort , can transition to inhalers as needed  - Pulmonary hygiene - Repeat CT in 2-3 months for surveillance of infrahilar lesion  Depression Anxiety T2DM HTN HLD Hypocalcemia Hyponatremia - Per Primary Team  Patient remains stable from a respiratory  standpoint with resolution of hemoptysis; recommend continued weaning of O2 as able.   PCCM will sign off at this time and remain available PRN. Thank you for involving us  in this  patient's care. Please do not hesitate to reach out if PCCM can be of further assistance.  Best Practice (right click and Reselect all SmartList Selections daily)   Per Primary Team  Signature:   Genoveva Kidney [facility] 09/30/23 7:35 AM  Please see [email or web address] for pager details.  From 7A-7P if no response, please call [phone] After hours, please call ELink [phone]

## 2023-09-30 NOTE — Progress Notes (Addendum)
 [facility]                                                                              [name] [name], is a 89 y.o. female, DOB - [birth year], WNU:[id number] Admit date - 09/25/2023    Outpatient Primary MD for the patient is Patient, No Pcp Per  LOS - 5  days  Chief Complaint  Patient presents with   Shortness of Breath   Hemoptysis       Brief summary   [name] [name] is a 89 y.o. female with medical history significant of anxiety, depression, osteoarthritis, atrophic vaginitis, cataracts, COPD, type 2 diabetes, hypertension, history of rhabdomyolysis, transaminitis, history of UTI who was sent from: [facility] with sudden onset of dyspnea and hemoptysis that woke her up around 0400.  O2 sat was in the 60s when EMS arrived.  She was placed on nasal cannula oxygen, given a DuoNeb, and albuterol  neb and 125 mg of Solu-Medrol . Continued to have Hemoptysis overnight so given TXA nebs. Pulmonary consulted and monitoring closely.    Assessment & Plan    Principal Problem: Acute respiratory failure with hypoxia (HCC) COPD exacerbation Infrahilar consolidation with hemoptysis, possible community-acquired PNA versus aspiration  -Continue IV Rocephin , Flagyl  in the setting of possible aspiration - SLP evaluation recommended dysphagia 3 diet - Pulmonology following - Continue I-S, pulmonary hygiene, Brovana, Pulmicort , Brovana, bronchodilators - 2D echo showed EF 50 to 55%, no regional wall motion abnormalities, indeterminate left ventricular diastolic parameters - Received Tranexamic Acid 500 mg Nebs x 3 days - Urine strep antigen negative - Overall improving, per pulm, repeat CT in 2 to 3 months for surveillance of infrahilar lesion.  Blood cultures negative so far.  Active problems Anxiety with depression -Continue Lexapro , Xanax  as needed  Diabetes mellitus type 2, NIDDM -Hold metformin  while inpatient, hemoglobin A1c 6.3 CBG (last 3)  Recent Labs     09/29/23 2008 09/30/23 0759 09/30/23 1125  GLUCAP 162* 111* 143*   Continue sliding scale insulin  while inpatient   Essential hypertension - BP stable, continue atenolol , losartan   Hyperlipidemia - Continue statin  NAG metabolic acidosis - Resolved  Hyponatremia - Likely may have underlying SIADH due to pulmonary issue - Serum osmolarity 278, urine osmolarity 268, urine sodium 47, TSH 0.6   Macrocytic anemia - Initially normocytic now MCV trended up to 103.4 - Has mild iron deficiency, B12, folate within normal limits    Estimated body mass index is 26.33 kg/m as calculated from the following:   Height as of this encounter: 5' 2 (1.575 m).   Weight as of this encounter: 65.3 kg.  Code Status: DNR DVT Prophylaxis:  Place and maintain sequential compression device Start: 09/26/23 0130   Level of Care: Level of care: Stepdown Family Communication: Updated patient's daughter on phone on 6/18 evening Disposition Plan:      Remains inpatient appropriate:   Will transfer to the floor.   Procedures:    Consultants:   PCCM  Antimicrobials:   Anti-infectives (From admission, onward)    Start     Dose/Rate Route Frequency Ordered Stop   09/27/23 1100  metroNIDAZOLE  (  FLAGYL ) IVPB 500 mg        500 mg 100 mL/hr over 60 Minutes Intravenous 2 times daily 09/27/23 1013     09/26/23 0800  cefTRIAXone  (ROCEPHIN ) 2 g in sodium chloride  0.9 % 100 mL IVPB        2 g 200 mL/hr over 30 Minutes Intravenous Every 24 hours 09/25/23 1318 09/29/23 0841   09/26/23 0800  azithromycin  (ZITHROMAX ) 500 mg in sodium chloride  0.9 % 250 mL IVPB  Status:  Discontinued        500 mg 250 mL/hr over 60 Minutes Intravenous Every 24 hours 09/25/23 1318 09/27/23 1013   09/25/23 0830  cefTRIAXone  (ROCEPHIN ) 1 g in sodium chloride  0.9 % 100 mL IVPB        1 g 200 mL/hr over 30 Minutes Intravenous  Once 09/25/23 0824 09/25/23 1002   09/25/23 0830  azithromycin  (ZITHROMAX ) 500 mg in sodium chloride   0.9 % 250 mL IVPB        500 mg 250 mL/hr over 60 Minutes Intravenous  Once 09/25/23 0824 09/25/23 1042          Medications  arformoterol  15 mcg Nebulization Q12H   atenolol   25 mg Oral BID   budesonide  (PULMICORT ) nebulizer solution  0.5 mg Nebulization BID   calcium carbonate  500 mg of elemental calcium Oral Q breakfast   Chlorhexidine  Gluconate Cloth  6 each Topical Daily   cholecalciferol  2,000 Units Oral Daily   diclofenac  Sodium  2 g Topical QID   escitalopram   20 mg Oral Daily   gabapentin   200 mg Oral TID   guaiFENesin  600 mg Oral BID   insulin  aspart  0-15 Units Subcutaneous TID WC   losartan   25 mg Oral Daily   melatonin  5 mg Oral QHS   pantoprazole  40 mg Oral BID   polyethylene glycol  17 g Oral BID   pravastatin   40 mg Oral q1800   senna-docusate  1 tablet Oral BID   tamsulosin   0.4 mg Oral Daily      Subjective:   [name] [name] was seen and examined today.  Has hearing deficit otherwise no acute issues overnight.  No nausea vomiting, chest pain, acute shortness of breath, fevers or chills.   Objective:   Vitals:   09/30/23 0700 09/30/23 0800 09/30/23 1000 09/30/23 1200  BP:  (!) 141/47 97/81   Pulse: (!) 52 (!) 56 67   Resp: 14 14 14    Temp:  97.7 F (36.5 C)  97.8 F (36.6 C)  TempSrc:  Oral  Oral  SpO2: 98% 100% 98%   Weight:      Height:        Intake/Output Summary (Last 24 hours) at 09/30/2023 1327 Last data filed at 09/30/2023 0900 Gross per 24 hour  Intake 496.69 ml  Output 925 ml  Net -428.31 ml     Wt Readings from Last 3 Encounters:  09/30/23 65.3 kg  09/14/23 58.5 kg  07/26/23 58.5 kg   Physical Exam General: Alert, chronically ill-appearing, hearing deficit Cardiovascular: S1 S2 clear, RRR.  Respiratory: CTAB, no wheezing Gastrointestinal: Soft, nontender, nondistended, NBS Ext: no pedal edema bilaterally Neuro: no new deficits Psych: Normal affect      Data Reviewed:  I have personally reviewed following  labs    CBC Lab Results  Component Value Date   WBC 9.3 09/30/2023   RBC 2.91 (L) 09/30/2023   HGB 9.3 (L) 09/30/2023   HCT 29.3 (  L) 09/30/2023   MCV 100.7 (H) 09/30/2023   MCH 32.0 09/30/2023   PLT 321 09/30/2023   MCHC 31.7 09/30/2023   RDW 12.3 09/30/2023   LYMPHSABS 1.7 09/30/2023   MONOABS 0.8 09/30/2023   EOSABS 0.3 09/30/2023   BASOSABS 0.0 09/30/2023     Last metabolic panel Lab Results  Component Value Date   NA 127 (L) 09/30/2023   K 4.1 09/30/2023   CL 98 09/30/2023   CO2 22 09/30/2023   BUN 8 09/30/2023   CREATININE 0.59 09/30/2023   GLUCOSE 110 (H) 09/30/2023   GFRNONAA >60 09/30/2023   GFRAA >60 04/28/2019   CALCIUM 8.0 (L) 09/30/2023   PHOS 3.2 09/30/2023   PROT 5.5 (L) 09/29/2023   ALBUMIN 2.9 (L) 09/30/2023   LABGLOB 2.4 04/21/2019   AGRATIO 1.9 04/21/2019   BILITOT 0.7 09/29/2023   ALKPHOS 51 09/29/2023   AST 21 09/29/2023   ALT 14 09/29/2023   ANIONGAP 7 09/30/2023    CBG (last 3)  Recent Labs    09/29/23 2008 09/30/23 0759 09/30/23 1125  GLUCAP 162* 111* 143*      Coagulation Profile: No results for input(s): INR, PROTIME in the last 168 hours.   Radiology Studies: I have personally reviewed the imaging studies  DG CHEST PORT 1 VIEW Result Date: 09/29/2023 CLINICAL DATA:  Shortness of breath. EXAM: PORTABLE CHEST 1 VIEW COMPARISON:  09/28/2023 FINDINGS: The cardio pericardial silhouette is enlarged. Interval increase in diffuse interstitial opacity concerning for edema. Persistent bibasilar atelectasis/infiltrate with probable layering small effusions. No acute bony abnormality. Telemetry leads overlie the chest. IMPRESSION: 1. Interval increase in diffuse interstitial opacity concerning for edema. 2. Persistent bibasilar atelectasis/infiltrate with probable layering small effusions. Electronically Signed   By: Donnal Fusi M.D.   On: 09/29/2023 09:40       Kimmie Berggren M.D. [facility] 09/30/2023, 1:27  PM  Available via Epic secure chat 7am-7pm After 7 pm, please refer to night coverage provider listed on amion.

## 2023-09-30 NOTE — Progress Notes (Signed)
 Occupational Therapy Treatment Patient Details Name: [name] [name] MRN: [id number] DOB: [birth year] Today's Date: 09/30/2023   History of present illness Patient is a 89 year old female who presented on 6/14 with O2 saturation in 60s. Patient was admitted with sepsis, COPD exacerbation, and acute respiratory failure with hypoxia. WUJ:[id number], depression, osteoarthritis, atrophic vaginitis, cataracts, COPD, type 2 diabetes, hypertension, history of rhabdomyolysis, transaminitis, history of UTI   OT comments  The pt was seen for seen for functional strengthening and progression of functional activity. She was assisted into sitting edge of bed. She required CGA to min assist to main sitting balance edge of bed. She required mod assist x2 to stand from edge of bed using Stedy device. She further needed assist for peri-hygiene/clean-up after being noted to be soiled of bowel. She was assisted to the bedside chair, where she required mod assist for upper body dressing and set-up assist for initiation of simple feeding. Continue OT plan of care. Patient will benefit from continued inpatient follow up therapy, <3 hours/day.       If plan is discharge home, recommend the following:  Two people to help with walking and/or transfers;A lot of help with bathing/dressing/bathroom;Assistance with cooking/housework;Direct supervision/assist for medications management;Assist for transportation;Help with stairs or ramp for entrance;Direct supervision/assist for financial management   Equipment Recommendations  Other (comment) (defer to next level of care)    Recommendations for Other Services      Precautions / Restrictions Precautions Precautions: Fall Restrictions Weight Bearing Restrictions Per Provider Order: No       Mobility Bed Mobility Overal bed mobility: Needs Assistance Bed Mobility: Supine to Sit     Supine to sit: Mod assist, +2 for physical assistance          Transfers Overall  transfer level: Needs assistance Equipment used:  Octaviano Belts) Transfers: Sit to/from Stand, Bed to chair/wheelchair/BSC Sit to Stand: Mod assist, From elevated surface, +2 physical assistance, +2 safety/equipment             Transfer via Lift Equipment: Stedy   Balance       Sitting balance - Comments: CGA to min assist     Standing balance-Leahy Scale: Zero          ADL either performed or assessed with clinical judgement   ADL Overall ADL's : Needs assistance/impaired Eating/Feeding: Set up;Sitting Eating/Feeding Details (indicate cue type and reason): The pt drank water  from a cup seated in the bedside chair.             Upper Body Dressing : Moderate assistance;Sitting Upper Body Dressing Details (indicate cue type and reason): The pt required assist to doff a hospital gown then to don another clean one seated in bedside chair. Lower Body Dressing: Total assistance;Bed level Lower Body Dressing Details (indicate cue type and reason): to donn socks in bed     Toileting- Clothing Manipulation and Hygiene: Total assistance Toileting - Clothing Manipulation Details (indicate cue type and reason): The pt was noted to be soiled of bowel after being assisted into sitting edge of bed. She stood ~3 times, in order for hygiene/clean-up to be provided. She used Stedy device for support in standing.             Communication Communication Factors Affecting Communication: Hearing impaired   Cognition Arousal: Alert Behavior During Therapy: WFL for tasks assessed/performed     Orientation impairments: Time (She reported the month to be February.)      Following commands impaired: Follows  one step commands with increased time                    Pertinent Vitals/ Pain       Pain Assessment Pain Assessment: No/denies pain   Frequency  Min 2X/week        Progress Toward Goals  OT Goals(current goals can now be found in the care plan section)      Acute Rehab OT Goals Patient Stated Goal: not specifically stated OT Goal Formulation: With patient Time For Goal Achievement: 10/11/23 Potential to Achieve Goals: Fair  Plan         AM-PAC OT 6 Clicks Daily Activity     Outcome Measure   Help from another person eating meals?: A Little Help from another person taking care of personal grooming?: A Little Help from another person toileting, which includes using toliet, bedpan, or urinal?: Total Help from another person bathing (including washing, rinsing, drying)?: A Lot Help from another person to put on and taking off regular upper body clothing?: A Lot Help from another person to put on and taking off regular lower body clothing?: Total 6 Click Score: 12    End of Session Equipment Utilized During Treatment: Gait belt;Other (comment) Octaviano Belts)  OT Visit Diagnosis: Unsteadiness on feet (R26.81);Other abnormalities of gait and mobility (R26.89);Muscle weakness (generalized) (M62.81)   Activity Tolerance Other (comment) (fair tolerance)   Patient Left in chair;with call bell/phone within reach   Nurse Communication Mobility status        Time: 1429-1500 OT Time Calculation (min): 31 min  Charges: OT General Charges $OT Visit: 1 Visit OT Treatments $Self Care/Home Management : 8-22 mins $Therapeutic Activity: 8-22 mins     Sheralyn Dies, OTR/L 09/30/2023, 3:35 PM

## 2023-09-30 NOTE — Progress Notes (Addendum)
 Floor report called to Rodrick Maindron LPN, for room 0981 on 5E.  Daughter [name] updated on room change, questions answered.  Pt transported via ICU bed to room 1518 and transferred to floor bed, with assist from accepting/5E LPN/NT.  Pt without s/s or c/o SOB, pain, neuro changes, or other changes to shift assessment during transport.  All belongings transported to floor, including mediations/nebulizer supplies, floor chart.

## 2023-10-01 DIAGNOSIS — J9601 Acute respiratory failure with hypoxia: Secondary | ICD-10-CM | POA: Diagnosis not present

## 2023-10-01 DIAGNOSIS — J189 Pneumonia, unspecified organism: Secondary | ICD-10-CM | POA: Diagnosis not present

## 2023-10-01 DIAGNOSIS — J441 Chronic obstructive pulmonary disease with (acute) exacerbation: Secondary | ICD-10-CM | POA: Diagnosis not present

## 2023-10-01 LAB — CBC WITH DIFFERENTIAL/PLATELET
Abs Immature Granulocytes: 0.03 10*3/uL (ref 0.00–0.07)
Basophils Absolute: 0 10*3/uL (ref 0.0–0.1)
Basophils Relative: 0 %
Eosinophils Absolute: 0.4 10*3/uL (ref 0.0–0.5)
Eosinophils Relative: 4 %
HCT: 33 % — ABNORMAL LOW (ref 36.0–46.0)
Hemoglobin: 10.4 g/dL — ABNORMAL LOW (ref 12.0–15.0)
Immature Granulocytes: 0 %
Lymphocytes Relative: 14 %
Lymphs Abs: 1.4 10*3/uL (ref 0.7–4.0)
MCH: 31.5 pg (ref 26.0–34.0)
MCHC: 31.5 g/dL (ref 30.0–36.0)
MCV: 100 fL (ref 80.0–100.0)
Monocytes Absolute: 0.7 10*3/uL (ref 0.1–1.0)
Monocytes Relative: 8 %
Neutro Abs: 7.1 10*3/uL (ref 1.7–7.7)
Neutrophils Relative %: 74 %
Platelets: 344 10*3/uL (ref 150–400)
RBC: 3.3 MIL/uL — ABNORMAL LOW (ref 3.87–5.11)
RDW: 12.2 % (ref 11.5–15.5)
WBC: 9.6 10*3/uL (ref 4.0–10.5)
nRBC: 0 % (ref 0.0–0.2)

## 2023-10-01 LAB — RENAL FUNCTION PANEL
Albumin: 3 g/dL — ABNORMAL LOW (ref 3.5–5.0)
Anion gap: 9 (ref 5–15)
BUN: 8 mg/dL (ref 8–23)
CO2: 23 mmol/L (ref 22–32)
Calcium: 8.5 mg/dL — ABNORMAL LOW (ref 8.9–10.3)
Chloride: 100 mmol/L (ref 98–111)
Creatinine, Ser: 0.52 mg/dL (ref 0.44–1.00)
GFR, Estimated: >60 mL/min (ref >60)
Glucose, Bld: 102 mg/dL — ABNORMAL HIGH (ref 70–99)
Phosphorus: 3.2 mg/dL (ref 2.5–4.6)
Potassium: 4.2 mmol/L (ref 3.5–5.1)
Sodium: 132 mmol/L — ABNORMAL LOW (ref 135–145)

## 2023-10-01 MED ORDER — BENZONATATE 100 MG PO CAPS: 100.0000 mg | ORAL_CAPSULE | Freq: Three times a day (TID) | ORAL | Status: AC | PRN

## 2023-10-01 MED ORDER — ALPRAZOLAM 0.25 MG PO TABS: 0.2500 mg | ORAL_TABLET | Freq: Two times a day (BID) | ORAL | 0 refills | Status: AC | PRN

## 2023-10-01 MED ORDER — GABAPENTIN 100 MG PO CAPS: 200.0000 mg | ORAL_CAPSULE | Freq: Three times a day (TID) | ORAL | Status: AC

## 2023-10-01 MED ORDER — SENNOSIDES-DOCUSATE SODIUM 8.6-50 MG PO TABS: 1.0000 | ORAL_TABLET | Freq: Two times a day (BID) | ORAL | Status: AC

## 2023-10-01 MED ORDER — GUAIFENESIN ER 600 MG PO TB12
600.0000 mg | ORAL_TABLET | Freq: Two times a day (BID) | ORAL | Status: AC
Start: 2023-10-01 — End: ?

## 2023-10-01 MED ORDER — SALINE SPRAY 0.65 % NA SOLN: 1.0000 | NASAL | Status: AC | PRN

## 2023-10-01 MED ORDER — HYDROCODONE BIT-HOMATROP MBR 5-1.5 MG/5ML PO SOLN: 5.0000 mL | Freq: Four times a day (QID) | ORAL | 0 refills | Status: AC | PRN

## 2023-10-01 MED ORDER — POLYETHYLENE GLYCOL 3350 17 G PO PACK: 17.0000 g | PACK | Freq: Two times a day (BID) | ORAL | Status: AC

## 2023-10-01 MED ORDER — PANTOPRAZOLE SODIUM 40 MG PO TBEC: 40.0000 mg | DELAYED_RELEASE_TABLET | Freq: Two times a day (BID) | ORAL | Status: AC

## 2023-10-01 MED ORDER — ONDANSETRON HCL 4 MG PO TABS: 4.0000 mg | ORAL_TABLET | Freq: Four times a day (QID) | ORAL | Status: AC | PRN

## 2023-10-01 NOTE — Progress Notes (Signed)
 Maryan Smalling [phone] [facility] Liaison Note:  Notified by [facility] manager of patient/family request for [facility] Palliative services at home after discharge.   Please call with any hospice or outpatient palliative care related questions.   Thank you for the opportunity to participate in this patient's care.   Madelene Schanz, BSN, RN, OCN [employer] [phone]

## 2023-10-01 NOTE — Progress Notes (Signed)
 IV removed, patient clean with new gown, VS WNL, MD contacted daughter, [name] transferred patient to [facility]. RN called report to Facility RN at [phone].

## 2023-10-01 NOTE — Plan of Care (Signed)
  Problem: Education: Goal: Knowledge of disease or condition will improve Outcome: Progressing Goal: Knowledge of the prescribed therapeutic regimen will improve Outcome: Progressing Goal: Individualized Educational Video(s) Outcome: Progressing   Problem: Activity: Goal: Ability to tolerate increased activity will improve Outcome: Progressing Goal: Will verbalize the importance of balancing activity with adequate rest periods Outcome: Progressing   Problem: Respiratory: Goal: Ability to maintain a clear airway will improve Outcome: Progressing Goal: Levels of oxygenation will improve Outcome: Progressing Goal: Ability to maintain adequate ventilation will improve Outcome: Progressing   Problem: Activity: Goal: Ability to tolerate increased activity will improve Outcome: Progressing   Problem: Clinical Measurements: Goal: Ability to maintain a body temperature in the normal range will improve Outcome: Progressing   Problem: Respiratory: Goal: Ability to maintain adequate ventilation will improve Outcome: Progressing Goal: Ability to maintain a clear airway will improve Outcome: Progressing   Problem: Education: Goal: Knowledge of General Education information will improve Description: Including pain rating scale, medication(s)/side effects and non-pharmacologic comfort measures Outcome: Progressing   Problem: Health Behavior/Discharge Planning: Goal: Ability to manage health-related needs will improve Outcome: Progressing   Problem: Clinical Measurements: Goal: Ability to maintain clinical measurements within normal limits will improve Outcome: Progressing Goal: Will remain free from infection Outcome: Progressing Goal: Diagnostic test results will improve Outcome: Progressing Goal: Respiratory complications will improve Outcome: Progressing Goal: Cardiovascular complication will be avoided Outcome: Progressing   Problem: Activity: Goal: Risk for activity  intolerance will decrease Outcome: Progressing   Problem: Nutrition: Goal: Adequate nutrition will be maintained Outcome: Progressing   Problem: Coping: Goal: Level of anxiety will decrease Outcome: Progressing   Problem: Elimination: Goal: Will not experience complications related to bowel motility Outcome: Progressing Goal: Will not experience complications related to urinary retention Outcome: Progressing   Problem: Pain Managment: Goal: General experience of comfort will improve and/or be controlled Outcome: Progressing   Problem: Safety: Goal: Ability to remain free from injury will improve Outcome: Progressing   Problem: Skin Integrity: Goal: Risk for impaired skin integrity will decrease Outcome: Progressing   Problem: Education: Goal: Ability to describe self-care measures that may prevent or decrease complications (Diabetes Survival Skills Education) will improve Outcome: Progressing Goal: Individualized Educational Video(s) Outcome: Progressing   Problem: Coping: Goal: Ability to adjust to condition or change in health will improve Outcome: Progressing   Problem: Fluid Volume: Goal: Ability to maintain a balanced intake and output will improve Outcome: Progressing   Problem: Health Behavior/Discharge Planning: Goal: Ability to identify and utilize available resources and services will improve Outcome: Progressing Goal: Ability to manage health-related needs will improve Outcome: Progressing   Problem: Metabolic: Goal: Ability to maintain appropriate glucose levels will improve Outcome: Progressing   Problem: Nutritional: Goal: Maintenance of adequate nutrition will improve Outcome: Progressing Goal: Progress toward achieving an optimal weight will improve Outcome: Progressing   Problem: Skin Integrity: Goal: Risk for impaired skin integrity will decrease Outcome: Progressing   Problem: Tissue Perfusion: Goal: Adequacy of tissue perfusion will  improve Outcome: Progressing

## 2023-10-01 NOTE — TOC Progression Note (Signed)
 Transition of Care [facility]) - Progression Note    Patient Details  Name: [name] [name] MRN: [id number] Date of Birth: 08/09/1931  Transition of Care [facility]) CM/SW Contact  Tessie Fila, RN Phone Number: 10/01/2023, 8:32 AM  Clinical Narrative:    Pt insurance went peer to peer and ended with denial due to her remaining at her baseline physically and no noted nursing needs. Kristin in Admissions at [facility] made aware of the denial for STR and states pt can return as LTC resident once medically ready. TOC following   Expected Discharge Plan: Long Term Nursing Home Barriers to Discharge: Continued Medical Work up  Expected Discharge Plan and Services In-house Referral: Clinical Social Work Discharge Planning Services: CM Consult Post Acute Care Choice: Nursing Home Living arrangements for the past 2 months: Skilled Nursing Facility                 DME Arranged: N/A DME Agency: NA       HH Arranged: NA HH Agency: NA         Social Determinants of Health (SDOH) Interventions SDOH Screenings   Food Insecurity: No Food Insecurity (09/25/2023)  Housing: Low Risk  (09/25/2023)  Transportation Needs: No Transportation Needs (09/25/2023)  Utilities: Not At Risk (09/25/2023)  Depression (PHQ2-9): Medium Risk (04/21/2019)  Financial Resource Strain: Low Risk  (12/08/2017)  Physical Activity: Inactive (12/12/2018)  Social Connections: Moderately Integrated (09/25/2023)  Stress: No Stress Concern Present (12/08/2017)  Tobacco Use: Low Risk  (09/28/2023)    Readmission Risk Interventions    09/28/2023   11:46 AM 01/12/2023    3:12 PM  Readmission Risk Prevention Plan  Post Dischage Appt  Not Complete  Medication Screening  Complete  Transportation Screening Complete Complete  PCP or Specialist Appt within 3-5 Days Complete   HRI or Home Care Consult Complete   Social Work Consult for Recovery Care Planning/Counseling Complete   Palliative Care Screening Not Applicable    Medication Review Oceanographer) Complete

## 2023-10-01 NOTE — Plan of Care (Signed)

## 2023-10-01 NOTE — Discharge Summary (Signed)
 Physician Discharge Summary   [name] [name]: [name] [name] MRN: [id number] DOB: [birth year]  Admit date:     09/25/2023  Discharge date: 10/01/23  Discharge Physician: Bertram Brocks, MD    PCP: [name] [name], No Pcp Per   Recommendations at discharge:   Repeat CT chest in 2 to 3 months for surveillance of infrahilar lesion.  Palliative medicine to follow-up outpatient  Discharge Diagnoses:    Acute respiratory failure with hypoxia (HCC) COPD with acute exacerbation Infrahilar consolidation with hemoptysis Aspiration pneumonia/community-acquired pneumonia right lower lobe Active Problems:   Hyperlipidemia   Depression with anxiety   Type 2 diabetes mellitus without complication, without long-term current use of insulin  (HCC)   Pressure injury of skin   Essential hypertension   Hypocalcemia Hyponatremia   Hospital Course:  [name] [name] is a 89 y.o. female with medical history significant of anxiety, depression, osteoarthritis, atrophic vaginitis, cataracts, COPD, type 2 diabetes, hypertension, history of rhabdomyolysis, transaminitis, history of UTI who was sent from the facility with sudden onset of dyspnea and hemoptysis that woke her up around 0400.  O2 sat was in the 60s when EMS arrived.  She was placed on nasal cannula oxygen, given a DuoNeb, and albuterol  neb and 125 mg of Solu-Medrol .  [name] [name] received tranexamic acid nebs, pulmonology was consulted.    Assessment and Plan:  Acute respiratory failure with hypoxia (HCC) COPD exacerbation Infrahilar consolidation with hemoptysis, possible community-acquired PNA versus aspiration  - [name] [name] received IV Rocephin , Flagyl  in the setting of aspiration.  Swallow evaluation was completed, recommended dysphagia 3 diet - Pulmonology was consulted.  [name] [name] was placed on bronchodilators including Brovana, Pulmicort , DuoNebs, incentive spirometry.  - 2D echo showed EF 50 to 55%, no regional wall motion abnormalities, indeterminate left ventricular  diastolic parameters - Received Tranexamic Acid 500 mg Nebs x 3 days - Urine strep antigen negative - Overall improving, per pulmonology, repeat CT chest in 2 to 3 months for surveillance of infrahilar lesion.  Blood cultures negative so far. -[name] [name] was seen by palliative medicine for goals of care, recommend outpatient palliative care team to follow.  DNR.    Anxiety with depression -Continue Lexapro , Xanax  as needed   Diabetes mellitus type 2, NIDDM - Metformin  was held inpatient and she was placed on sliding scale insulin   - Hemoglobin A1c 6.3.   -Continue outpatient regimen  Essential hypertension - BP stable, continue atenolol , losartan    Hyperlipidemia - Continue statin   NAG metabolic acidosis - Resolved   Hyponatremia - Likely may have underlying SIADH due to pulmonary issue - Serum osmolarity 278, urine osmolarity 268, urine sodium 47, TSH 0.6 -Improving, sodium 132 at discharge   Macrocytic anemia - Initially normocytic now MCV trended up to 103.4 - Has mild iron deficiency, B12, folate within normal limits -H&H improving, 10.4 at discharge     Estimated body mass index is 26.33 kg/m as calculated from the following:   Height as of this encounter: 5' 2 (1.575 m).   Weight as of this encounter: 65.3 kg.   [name] [name]'s daughter was updated and agreed with the plan and discharged today to facility      Pain control - [place]  Controlled Substance Reporting System database was reviewed. and [name] [name] was instructed, not to drive, operate heavy machinery, perform activities at heights, swimming or participation in water  activities or provide baby-sitting services while on Pain, Sleep and Anxiety Medications; until their outpatient Physician has advised to do so again. Also recommended to not to take more than prescribed  Pain, Sleep and Anxiety Medications.  Consultants: Pulmonology Procedures performed: None Disposition: Long term care facility  Diet  recommendation: Dysphagia 3 diet with nectar thick liquids, meds crushed in pure.  May have plain/thin water  in between the meals.  GERD/reflux precautions.  DISCHARGE MEDICATION: Allergies as of 10/01/2023       Reactions   Lisinopril  Cough   Penicillins Other (See Comments)   Can't remember   Betadine [povidone Iodine] Rash   Hydrogen Peroxide Rash   Sertraline Hcl Other (See Comments)   Sweating and mouth irritation   Sulfa Antibiotics Nausea And Vomiting        Medication List     TAKE these medications    acetaminophen  325 MG tablet Commonly known as: TYLENOL  Take 2 tablets (650 mg total) by mouth every 6 (six) hours as needed for mild pain (pain score 1-3) (or Fever >/= 101). What changed: when to take this   ALPRAZolam  0.25 MG tablet Commonly known as: XANAX  Take 1 tablet (0.25 mg total) by mouth 2 (two) times daily as needed for anxiety. What changed: additional instructions   ascorbic acid  500 MG tablet Commonly known as: VITAMIN C  Take 1 tablet (500 mg total) by mouth 2 (two) times daily.   atenolol  25 MG tablet Commonly known as: TENORMIN  Take 25 mg by mouth 2 (two) times daily.   benzonatate  100 MG capsule Commonly known as: TESSALON  Take 1 capsule (100 mg total) by mouth 3 (three) times daily as needed for cough.   budesonide -formoterol  160-4.5 MCG/ACT inhaler Commonly known as: SYMBICORT  Inhale 2 puffs into the lungs 2 (two) times daily.   cholecalciferol 1000 units tablet Commonly known as: VITAMIN D  Take 2,000 Units by mouth daily.   cloNIDine  0.1 MG tablet Commonly known as: CATAPRES  Take 1 tablet (0.1 mg total) by mouth at bedtime as needed. What changed:  when to take this reasons to take this   diclofenac  Sodium 1 % Gel Commonly known as: VOLTAREN  Apply 2 g topically 4 (four) times daily.   escitalopram  20 MG tablet Commonly known as: LEXAPRO  Take 1 tablet (20 mg total) by mouth daily. as directed What changed: additional  instructions   feeding supplement Liqd Take 237 mLs by mouth 2 (two) times daily between meals.   fluticasone  50 MCG/ACT nasal spray Commonly known as: FLONASE  USE ONE SPRAY in each nostril ONCE daily What changed:  how much to take how to take this when to take this   gabapentin  100 MG capsule Commonly known as: NEURONTIN  Take 2 capsules (200 mg total) by mouth 3 (three) times daily.   guaiFENesin 600 MG 12 hr tablet Commonly known as: MUCINEX Take 1 tablet (600 mg total) by mouth 2 (two) times daily.   HYDROcodone  bit-homatropine 5-1.5 MG/5ML syrup Commonly known as: HYCODAN Take 5 mLs by mouth every 6 (six) hours as needed (cough).   ketoconazole  2 % shampoo Commonly known as: NIZORAL  Apply 1 application topically 2 (two) times a week.   losartan  25 MG tablet Commonly known as: COZAAR  Take 25 mg by mouth daily.   lovastatin  40 MG tablet Commonly known as: MEVACOR  Take 1 tablet (40 mg total) by mouth daily.   Melatonin 5 MG Caps Take 1 capsule (5 mg total) by mouth at bedtime.   metFORMIN  500 MG 24 hr tablet Commonly known as: GLUCOPHAGE -XR 1 tablet once daily What changed:  how much to take how to take this when to take this   multivitamin with minerals Tabs tablet  Take 1 tablet by mouth daily.   NovoLOG  FlexPen 100 UNIT/ML FlexPen Generic drug: insulin  aspart Inject 1-10 Units into the skin 3 (three) times daily with meals. Per sliding scale; BG 71-150 = 0 units; BG 151-200 = 2 units; BG 201-250 = 4 units; BG 251-300 = 6 units; 301-350 = 8 units; BG 351-400 = 10 units   ondansetron  4 MG tablet Commonly known as: ZOFRAN  Take 1 tablet (4 mg total) by mouth every 6 (six) hours as needed for nausea.   pantoprazole 40 MG tablet Commonly known as: PROTONIX Take 1 tablet (40 mg total) by mouth 2 (two) times daily.   polyethylene glycol 17 g packet Commonly known as: MIRALAX  / GLYCOLAX  Take 17 g by mouth 2 (two) times daily. What changed:  when to take  this reasons to take this   senna-docusate 8.6-50 MG tablet Commonly known as: Senokot-S Take 1 tablet by mouth 2 (two) times daily.   sodium chloride  0.65 % Soln nasal spray Commonly known as: OCEAN Place 1 spray into both nostrils as needed for congestion.   Super Calcium 1500 (600 Ca) MG Tabs tablet Generic drug: calcium carbonate Take 600 mg of elemental calcium by mouth daily with breakfast.   tamsulosin  0.4 MG Caps capsule Commonly known as: FLOMAX  Take 0.4 mg by mouth daily.               Discharge Care Instructions  (From admission, onward)           Start     Ordered   10/01/23 0000  If the dressing is still on your incision site when you go home, remove it on the third day after your surgery date. Remove dressing if it begins to fall off, or if it is dirty or damaged before the third day.        10/01/23 0932            Discharge Exam: Filed Weights   09/25/23 1348 09/29/23 2200 09/30/23 0500  Weight: 63.6 kg 65.4 kg 65.3 kg   S: No acute complaints, no further hemoptysis, vital signs stable, on room air, cleared to be discharged to LTC.  BP (!) 146/52 (BP Location: Right Arm)   Pulse (!) 57   Temp 97.6 F (36.4 C)   Resp 18   Ht 5' 2 (1.575 m)   Wt 65.3 kg   SpO2 93%   BMI 26.33 kg/m   Physical Exam General: Alert and oriented, NAD, hearing deficit Cardiovascular: S1 S2 clear, RRR.  Respiratory: CTAB, no wheezing, rales or rhonchi Gastrointestinal: Soft, nontender, nondistended, NBS Ext: no pedal edema bilaterally Neuro: no new deficits Psych: Normal affect    Condition at discharge: fair  The results of significant diagnostics from this hospitalization (including imaging, microbiology, ancillary and laboratory) are listed below for reference.   Imaging Studies: DG CHEST PORT 1 VIEW Result Date: 09/29/2023 CLINICAL DATA:  Shortness of breath. EXAM: PORTABLE CHEST 1 VIEW COMPARISON:  09/28/2023 FINDINGS: The cardio pericardial  silhouette is enlarged. Interval increase in diffuse interstitial opacity concerning for edema. Persistent bibasilar atelectasis/infiltrate with probable layering small effusions. No acute bony abnormality. Telemetry leads overlie the chest. IMPRESSION: 1. Interval increase in diffuse interstitial opacity concerning for edema. 2. Persistent bibasilar atelectasis/infiltrate with probable layering small effusions. Electronically Signed   By: Donnal Fusi M.D.   On: 09/29/2023 09:40   DG CHEST PORT 1 VIEW Result Date: 09/28/2023 CLINICAL DATA:  Shortness of breath. EXAM: PORTABLE CHEST 1 VIEW  COMPARISON:  09/27/2023 FINDINGS: The cardio pericardial silhouette is enlarged. Diffuse interstitial opacity is stable. Interval progression of bibasilar airspace opacity compatible with atelectasis or infection. New tiny bilateral pleural effusions. No acute bony abnormality. Telemetry leads overlie the chest. IMPRESSION: 1. Interval progression of bibasilar airspace opacity compatible with atelectasis or infection. 2. New tiny bilateral pleural effusions. Electronically Signed   By: Donnal Fusi M.D.   On: 09/28/2023 07:48   DG Swallowing Func-Speech Pathology Result Date: 09/27/2023 Table formatting from the original result was not included. Modified Barium Swallow Study [name] [name] Details Name: MARGRETE DELUDE MRN: [id number] Date of Birth: January 04, 1932 Today's Date: 09/27/2023 HPI/PMH: HPI: History of anxiety, depression, osteoarthritis, COPD, type 2 diabetes, hypertension, UTI  Woke up from sleep around 4 AM with hemoptysis, EMS found with saturations in the 60s, placed on nasal cannula was given bronchodilators. CT scan of the chest was obtained- Right infrahilar consolidation, possible mass lesion. Has been observed to cough with drinking. Clinical Impression: Clinical Impression: [name] [name] presents with an oropharyngeal dysphagia as per this MBS but what is more concerning is her esophageal phase of swallowing with  retrograde movement of majority of barium given up to level of thoracic esophagus but staying below PES, prominent cricopharyngael bar which resulted in delays in barium transit. Her esophagus appeared to be filled with air/gas and she did exhibit instances of belching during study. Swallow initiated at level of pyriform sinus with thin liquids and vallecular sinus with solids and honey thick liquids. Swallow initiation was delayed at level of the posterior laryngeal surface of the epiglottis with nectar thick liquids. Penetration to the vocal cords that did not consistently clear out (PAS 3, 5) occured with thin liquids but no aspiration observed. No penetration or aspiration observed with nectar or honey thick liquids or puree, mechanical soft solids. Pharyngeal residuals were more prevalent with thin and nectar thick liquids than with honey thick liquids or solids. [name] [name] will be at a high risk of post-prandial aspiration secondary to the significant s/s of esophageal dysphagia observed during this MBS. The safest PO consistency to decrease aspiration risk is dysphagia 3 solids (mechanical soft) and nectar thick liquids. [name] [name] may have plain/thin water  in between meals. SLP will follow for toleration. Factors that may increase risk of adverse event in presence of aspiration Roderick Civatte & Jessy Morocco 2021): Factors that may increase risk of adverse event in presence of aspiration Roderick Civatte & Jessy Morocco 2021): Limited mobility; Frail or deconditioned Recommendations/Plan: Swallowing Evaluation Recommendations Swallowing Evaluation Recommendations Recommendations: PO diet PO Diet Recommendation: Dysphagia 3 (Mechanical soft); Mildly thick liquids (Level 2, nectar thick) Liquid Administration via: Cup; Straw Medication Administration: Crushed with puree Supervision: Staff to assist with self-feeding; Full supervision/cueing for swallowing strategies Swallowing strategies  : Slow rate; Small bites/sips Postural changes: Stay  upright 30-60 min after meals; Position pt fully upright for meals Oral care recommendations: Oral care BID (2x/day) Treatment Plan Treatment Plan Treatment recommendations: Therapy as outlined in treatment plan below Follow-up recommendations: Skilled nursing-short term rehab (<3 hours/day) Functional status assessment: [name] [name] has had a recent decline in their functional status and demonstrates the ability to make significant improvements in function in a reasonable and predictable amount of time. Treatment frequency: Min 2x/week Treatment duration: 1 week Interventions: Aspiration precaution training; Diet toleration management by SLP; [name] [name]/family education; Trials of upgraded texture/liquids Recommendations Recommendations for follow up therapy are one component of a multi-disciplinary discharge planning process, led by the attending physician.  Recommendations may be updated based on [name] [name] status,  additional functional criteria and insurance authorization. Assessment: Orofacial Exam: Orofacial Exam Oral Cavity: Oral Hygiene: WFL Oral Cavity - Dentition: Adequate natural dentition Orofacial Anatomy: WFL Oral Motor/Sensory Function: WFL Anatomy: Anatomy: Prominent cricopharyngeus Boluses Administered: Boluses Administered Boluses Administered: Thin liquids (Level 0); Mildly thick liquids (Level 2, nectar thick); Moderately thick liquids (Level 3, honey thick); Puree; Solid  Oral Impairment Domain: Oral Impairment Domain Lip Closure: No labial escape Tongue control during bolus hold: Not tested Bolus preparation/mastication: Timely and efficient chewing and mashing Bolus transport/lingual motion: Brisk tongue motion Oral residue: Complete oral clearance Initiation of pharyngeal swallow : Valleculae; Posterior laryngeal surface of the epiglottis; Pyriform sinuses  Pharyngeal Impairment Domain: Pharyngeal Impairment Domain Soft palate elevation: No bolus between soft palate (SP)/pharyngeal wall (PW) Laryngeal  elevation: Complete superior movement of thyroid  cartilage with complete approximation of arytenoids to epiglottic petiole Anterior hyoid excursion: Partial anterior movement Epiglottic movement: Complete inversion Laryngeal vestibule closure: Incomplete, narrow column air/contrast in laryngeal vestibule Pharyngeal stripping wave : Present - complete Pharyngeal contraction (A/P view only): N/A Pharyngoesophageal segment opening: Complete distension and complete duration, no obstruction of flow Tongue base retraction: Trace column of contrast or air between tongue base and PPW Pharyngeal residue: Collection of residue within or on pharyngeal structures Location of pharyngeal residue: Valleculae; Pharyngeal wall; Pyriform sinuses  Esophageal Impairment Domain: Esophageal Impairment Domain Esophageal clearance upright position: Esophageal retention with retrograde flow below pharyngoesophageal segment (PES) Pill: No data recorded Penetration/Aspiration Scale Score: Penetration/Aspiration Scale Score 1.  Material does not enter airway: Moderately thick liquids (Level 3, honey thick); Puree; Solid; Mildly thick liquids (Level 2, nectar thick) 3.  Material enters airway, remains ABOVE vocal cords and not ejected out: Thin liquids (Level 0) 5.  Material enters airway, CONTACTS cords and not ejected out: Thin liquids (Level 0) Compensatory Strategies: Compensatory Strategies Compensatory strategies: No   General Information: Caregiver present: No  Diet Prior to this Study: NPO   No data recorded  Respiratory Status: WFL   Supplemental O2: Nasal cannula   History of Recent Intubation: No  Behavior/Cognition: Alert; Cooperative; Pleasant mood Self-Feeding Abilities: Needs assist with self-feeding Baseline vocal quality/speech: Normal No data recorded Volitional Swallow: Able to elicit Exam Limitations: No limitations Goal Planning: Prognosis for improved oropharyngeal function: Fair Barriers to Reach Goals: Severity of  deficits; Time post onset No data recorded No data recorded Consulted and agree with results and recommendations: Pt unable/family or caregiver not available; Physician Pain: Pain Assessment Pain Assessment: No/denies pain Pain Score: 0 Pain Location: Voltarin  rubbed on knees as pt. reports pain  but then denied that there was pain. End of Session: Start Time:SLP Start Time (ACUTE ONLY): 1250 Stop Time: SLP Stop Time (ACUTE ONLY): 1310 Time Calculation:SLP Time Calculation (min) (ACUTE ONLY): 20 min Charges: SLP Evaluations $ SLP Speech Visit: 1 Visit SLP Evaluations $BSS Swallow: 1 Procedure $MBS Swallow: 1 Procedure SLP visit diagnosis: SLP Visit Diagnosis: Dysphagia, pharyngoesophageal phase (R13.14); Dysphagia, oropharyngeal phase (R13.12) Past Medical History: Past Medical History: Diagnosis Date  Anxiety   Arthritis   Atrophic vaginitis   Cataract   COPD (chronic obstructive pulmonary disease) (HCC)   Depressive disorder, not elsewhere classified   Diabetes mellitus without complication (HCC)   Disorder of bone and cartilage, unspecified   Elevated blood sugar   Emphysema lung (HCC)   Hypertension   Other and unspecified hyperlipidemia   Postmenopausal   Rhinitis  Past Surgical History: Past Surgical History: Procedure Laterality Date  APPENDECTOMY  1957  CATARACT  EXTRACTION W/ INTRAOCULAR LENS IMPLANT Bilateral 2005  [place]  COLONOSCOPY N/A 03/08/2014  Procedure: COLONOSCOPY;  Surgeon: Ruby Corporal, MD;  Location: [facility];  Service: Endoscopy;  Laterality: N/A;  EYE SURGERY    JOINT REPLACEMENT    TOTAL HIP ARTHROPLASTY Left 12/27/2013  Procedure: LEFT TOTAL HIP ARTHROPLASTY;  Surgeon: Timothy Ford, MD;  Location: [facility] OR;  Service: Orthopedics;  Laterality: Left; Jacqualine Mater, MA, CCC-SLP Speech Therapy   DG CHEST PORT 1 VIEW Result Date: 09/27/2023 CLINICAL DATA:  Shortness of breath. EXAM: PORTABLE CHEST 1 VIEW COMPARISON:  09/25/2023 FINDINGS: The cardio pericardial silhouette is enlarged.  Interstitial markings are diffusely coarsened with chronic features. Patchy opacity in the medial right base compatible with airspace disease seen in this region on CT scan 2 days ago. No substantial pleural effusion. No acute bony abnormality. Telemetry leads overlie the chest. IMPRESSION: Patchy opacity in the medial right base compatible with airspace disease seen in this region on CT scan 2 days ago. Electronically Signed   By: Donnal Fusi M.D.   On: 09/27/2023 09:12   ECHOCARDIOGRAM COMPLETE Result Date: 09/26/2023    ECHOCARDIOGRAM REPORT   [name] [name] Name:   RESHMA HOEY Date of Exam: 09/26/2023 Medical Rec #:  [id number]       Height:       62.0 in Accession #:    [id number]      Weight:       140.2 lb Date of Birth:  1931/08/30       BSA:          1.644 m [name] [name] Age:    89 years        BP:           115/37 mmHg [name] [name] Gender: F               HR:           64 bpm. Exam Location:  Inpatient Procedure: 2D Echo, Color Doppler, Cardiac Doppler and Intracardiac            Opacification Agent (Both Spectral and Color Flow Doppler were            utilized during procedure). Indications:    Abnormal ECG                 CAD Native Vessel  History:        [name] [name] has no prior history of Echocardiogram examinations.                 COPD; Risk Factors:Hypertension and Diabetes.  Sonographer:    Travis Friedman RDCS Referring Phys: [id number] DAVID MANUEL ORTIZ IMPRESSIONS  1. Left ventricular ejection fraction, by estimation, is 50 to 55%. The left ventricle has low normal function. The left ventricle has no regional wall motion abnormalities. Left ventricular diastolic parameters are indeterminate.  2. Right ventricular systolic function is normal. The right ventricular size is normal. There is mildly elevated pulmonary artery systolic pressure. The estimated right ventricular systolic pressure is 45.0 mmHg.  3. Right atrial size was moderately dilated.  4. The mitral valve is normal in structure. Trivial mitral valve  regurgitation. No evidence of mitral stenosis.  5. Tricuspid valve regurgitation is mild to moderate.  6. The aortic valve is tricuspid. Aortic valve regurgitation is trivial. Aortic valve sclerosis/calcification is present, without any evidence of aortic stenosis.  7. The inferior vena cava is dilated in size with >50% respiratory variability, suggesting right atrial pressure  of 8 mmHg. FINDINGS  Left Ventricle: Left ventricular ejection fraction, by estimation, is 50 to 55%. The left ventricle has low normal function. The left ventricle has no regional wall motion abnormalities. The left ventricular internal cavity size was normal in size. There is no left ventricular hypertrophy. Left ventricular diastolic parameters are indeterminate. Right Ventricle: The right ventricular size is normal. No increase in right ventricular wall thickness. Right ventricular systolic function is normal. There is mildly elevated pulmonary artery systolic pressure. The tricuspid regurgitant velocity is 3.04  m/s, and with an assumed right atrial pressure of 8 mmHg, the estimated right ventricular systolic pressure is 45.0 mmHg. Left Atrium: Left atrial size was normal in size. Right Atrium: Right atrial size was moderately dilated. Pericardium: There is no evidence of pericardial effusion. Mitral Valve: The mitral valve is normal in structure. Trivial mitral valve regurgitation. No evidence of mitral valve stenosis. Tricuspid Valve: The tricuspid valve is normal in structure. Tricuspid valve regurgitation is mild to moderate. Aortic Valve: The aortic valve is tricuspid. Aortic valve regurgitation is trivial. Aortic valve sclerosis/calcification is present, without any evidence of aortic stenosis. Aortic valve mean gradient measures 3.0 mmHg. Aortic valve peak gradient measures 5.0 mmHg. Aortic valve area, by VTI measures 2.04 cm. Pulmonic Valve: The pulmonic valve was not well visualized. Pulmonic valve regurgitation is not  visualized. Aorta: The aortic root and ascending aorta are structurally normal, with no evidence of dilitation. Venous: The inferior vena cava is dilated in size with greater than 50% respiratory variability, suggesting right atrial pressure of 8 mmHg. IAS/Shunts: The interatrial septum was not well visualized.  LEFT VENTRICLE PLAX 2D LVIDd:         4.10 cm     Diastology LVIDs:         3.00 cm     LV e' medial:    8.05 cm/s LV PW:         1.00 cm     LV E/e' medial:  10.4 LV IVS:        1.10 cm     LV e' lateral:   6.96 cm/s LVOT diam:     1.90 cm     LV E/e' lateral: 12.0 LV SV:         51 LV SV Index:   31 LVOT Area:     2.84 cm  LV Volumes (MOD) LV vol d, MOD A2C: 92.9 ml LV vol d, MOD A4C: 98.8 ml LV vol s, MOD A2C: 41.4 ml LV vol s, MOD A4C: 45.6 ml LV SV MOD A2C:     51.5 ml LV SV MOD A4C:     98.8 ml LV SV MOD BP:      54.6 ml RIGHT VENTRICLE             IVC RV S prime:     10.70 cm/s  IVC diam: 2.10 cm TAPSE (M-mode): 2.6 cm LEFT ATRIUM             Index        RIGHT ATRIUM           Index LA diam:        4.00 cm 2.43 cm/m   RA Area:     22.70 cm LA Vol (A2C):   61.4 ml 37.35 ml/m  RA Volume:   76.00 ml  46.23 ml/m LA Vol (A4C):   31.6 ml 19.22 ml/m LA Biplane Vol: 45.3 ml 27.56 ml/m  AORTIC VALVE AV Area (Vmax):  2.13 cm AV Area (Vmean):   2.02 cm AV Area (VTI):     2.04 cm AV Vmax:           112.00 cm/s AV Vmean:          78.500 cm/s AV VTI:            0.250 m AV Peak Grad:      5.0 mmHg AV Mean Grad:      3.0 mmHg LVOT Vmax:         84.20 cm/s LVOT Vmean:        55.800 cm/s LVOT VTI:          0.180 m LVOT/AV VTI ratio: 0.72  AORTA Ao Root diam: 2.90 cm Ao Asc diam:  2.80 cm MITRAL VALVE               TRICUSPID VALVE MV Area (PHT): 4.06 cm    TR Peak grad:   37.0 mmHg MV Decel Time: 187 msec    TR Vmax:        304.00 cm/s MV E velocity: 83.70 cm/s MV A velocity: 42.70 cm/s  SHUNTS MV E/A ratio:  1.96        Systemic VTI:  0.18 m                            Systemic Diam: 1.90 cm Carson Clara MD Electronically signed by Carson Clara MD Signature Date/Time: 09/26/2023/2:02:41 PM    Final    CT Angio Chest PE W and/or Wo Contrast Result Date: 09/25/2023 CLINICAL DATA:  PE suspected, shortness of breath, hemoptysis * Tracking Code: BO * EXAM: CT ANGIOGRAPHY CHEST WITH CONTRAST TECHNIQUE: Multidetector CT imaging of the chest was performed using the standard protocol during bolus administration of intravenous contrast. Multiplanar CT image reconstructions and MIPs were obtained to evaluate the vascular anatomy. RADIATION DOSE REDUCTION: This exam was performed according to the departmental dose-optimization program which includes automated exposure control, adjustment of the mA and/or kV according to [name] [name] size and/or use of iterative reconstruction technique. CONTRAST:  75mL OMNIPAQUE IOHEXOL 350 MG/ML SOLN COMPARISON:  None Available. FINDINGS: Cardiovascular: Satisfactory opacification of the pulmonary arteries to the segmental level. No evidence of pulmonary embolism. Normal heart size. Left coronary artery calcifications. No pericardial effusion. Aortic atherosclerosis. Mediastinum/Nodes: No enlarged mediastinal, hilar, or axillary lymph nodes. Thyroid  gland, trachea, and esophagus demonstrate no significant findings. Lungs/Pleura: Trace pleural effusions. Small, masslike consolidation of the infrahilar right lower lobe (series 12, image 92). Heterogeneous airspace opacity in the adjacent right lower lobe posteriorly (series 12, image 43). Upper Abdomen: No acute abnormality. Calcific stigmata chronic pancreatitis in the included upper abdomen. Musculoskeletal: No chest wall abnormality. No acute osseous findings. Review of the MIP images confirms the above findings. IMPRESSION: 1. Negative examination for pulmonary embolism. 2. Small, masslike consolidation of the infrahilar right lower lobe. Heterogeneous airspace opacity in the adjacent right lower lobe posteriorly. Findings are  possibly related to infection or aspiration although follow-up CT is recommended in 3 months to ensure resolution and exclude underlying malignancy. 3. Trace pleural effusions. 4. Coronary artery disease. Aortic Atherosclerosis (ICD10-I70.0). Electronically Signed   By: Fredricka Jenny M.D.   On: 09/25/2023 08:14   DG Chest Port 1 View Result Date: 09/25/2023 EXAM: 1 VIEW XRAY OF THE CHEST 09/25/2023 07:15:00 AM COMPARISON: 1 view chest x-ray 07/26/2023. CLINICAL HISTORY: 141880 SOB (shortness of breath) 141880. Pt in via [facility] from  [facility] with sudden episode of sob and hemoptysis that reportedly woke her up at 0400. EMS states when they arrived, sats were in high 60's and she was cyanotic, wheezing in all fields. FINDINGS: LUNGS AND PLEURA: Mild edema is present. Bibasilar airspace opacities likely reflect atelectasis. Infection is not excluded. No pleural effusion. No pneumothorax. HEART AND MEDIASTINUM: Atherosclerotic calcifications are present at the aortic arch. No acute abnormality of the cardiac and mediastinal silhouettes. BONES AND SOFT TISSUES: No acute osseous abnormality. IMPRESSION: 1. Mild edema. 2. Bibasilar airspace opacities, likely atelectasis. Infection is not excluded. Electronically signed by: Audree Leas MD 09/25/2023 08:04 AM EDT RP Workstation: [id number]    Microbiology: Results for orders placed or performed during the hospital encounter of 09/25/23  Resp panel by RT-PCR (RSV, Flu A&B, Covid) Anterior Nasal Swab     Status: None   Collection Time: 09/25/23  7:00 AM   Specimen: Anterior Nasal Swab  Result Value Ref Range Status   SARS Coronavirus 2 by RT PCR NEGATIVE NEGATIVE Final    Comment: (NOTE) SARS-CoV-2 target nucleic acids are NOT DETECTED.  The SARS-CoV-2 RNA is generally detectable in upper respiratory specimens during the acute phase of infection. The lowest concentration of SARS-CoV-2 viral copies this assay can detect is 138 copies/mL. A  negative result does not preclude SARS-Cov-2 infection and should not be used as the sole basis for treatment or other [name] [name] management decisions. A negative result may occur with  improper specimen collection/handling, submission of specimen other than nasopharyngeal swab, presence of viral mutation(s) within the areas targeted by this assay, and inadequate number of viral copies(<138 copies/mL). A negative result must be combined with clinical observations, [name] [name] history, and epidemiological information. The expected result is Negative.  Fact Sheet for Patients:  BloggerCourse.com  Fact Sheet for Healthcare Providers:  SeriousBroker.it  This test is no t yet approved or cleared by the [place]  FDA and  has been authorized for detection and/or diagnosis of SARS-CoV-2 by FDA under an Emergency Use Authorization (EUA). This EUA will remain  in effect (meaning this test can be used) for the duration of the COVID-19 declaration under Section 564(b)(1) of the Act, 21 U.S.C.section 360bbb-3(b)(1), unless the authorization is terminated  or revoked sooner.       Influenza A by PCR NEGATIVE NEGATIVE Final   Influenza B by PCR NEGATIVE NEGATIVE Final    Comment: (NOTE) The Xpert Xpress SARS-CoV-2/FLU/RSV plus assay is intended as an aid in the diagnosis of influenza from Nasopharyngeal swab specimens and should not be used as a sole basis for treatment. Nasal washings and aspirates are unacceptable for Xpert Xpress SARS-CoV-2/FLU/RSV testing.  Fact Sheet for Patients: BloggerCourse.com  Fact Sheet for Healthcare Providers: SeriousBroker.it  This test is not yet approved or cleared by the [place]  FDA and has been authorized for detection and/or diagnosis of SARS-CoV-2 by FDA under an Emergency Use Authorization (EUA). This EUA will remain in effect (meaning this test can  be used) for the duration of the COVID-19 declaration under Section 564(b)(1) of the Act, 21 U.S.C. section 360bbb-3(b)(1), unless the authorization is terminated or revoked.     Resp Syncytial Virus by PCR NEGATIVE NEGATIVE Final    Comment: (NOTE) Fact Sheet for Patients: BloggerCourse.com  Fact Sheet for Healthcare Providers: SeriousBroker.it  This test is not yet approved or cleared by the [place]  FDA and has been authorized for detection and/or diagnosis of SARS-CoV-2 by FDA under an Emergency Use Authorization (EUA). This EUA  will remain in effect (meaning this test can be used) for the duration of the COVID-19 declaration under Section 564(b)(1) of the Act, 21 U.S.C. section 360bbb-3(b)(1), unless the authorization is terminated or revoked.  Performed at [facility], [address], [place], [place] [address]   Blood culture (routine x 2)     Status: None   Collection Time: 09/25/23  8:25 AM   Specimen: BLOOD  Result Value Ref Range Status   Specimen Description   Final    BLOOD LEFT ANTECUBITAL Performed at [facility], [address], [place], [place] [address]    Special Requests   Final    BOTTLES DRAWN AEROBIC AND ANAEROBIC Blood Culture results may not be optimal due to an inadequate volume of blood received in culture bottles Performed at [facility], [address] [place], [place] [address]    Culture   Final    NO GROWTH 5 DAYS Performed at [facility], [address], [place], [place] [address]    Report Status 09/30/2023 FINAL  Final  Blood culture (routine x 2)     Status: None   Collection Time: 09/25/23  8:35 AM   Specimen: BLOOD  Result Value Ref Range Status   Specimen Description   Final    BLOOD LEFT ANTECUBITAL Performed at [facility], [address] [place], [place] [address]    Special Requests    Final    BOTTLES DRAWN AEROBIC AND ANAEROBIC Blood Culture results may not be optimal due to an inadequate volume of blood received in culture bottles Performed at [facility], [address] [place], [place] [address]    Culture   Final    NO GROWTH 5 DAYS Performed at [facility], [address], [place], [place] [address]    Report Status 09/30/2023 FINAL  Final  MRSA Next Gen by PCR, Nasal     Status: None   Collection Time: 09/25/23  1:43 PM   Specimen: Nasal Mucosa; Nasal Swab  Result Value Ref Range Status   MRSA by PCR Next Gen NOT DETECTED NOT DETECTED Final    Comment: (NOTE) The GeneXpert MRSA Assay (FDA approved for NASAL specimens only), is one component of a comprehensive MRSA colonization surveillance program. It is not intended to diagnose MRSA infection nor to guide or monitor treatment for MRSA infections. Test performance is not FDA approved in patients less than 38 years old. Performed at [facility], [address], [place], [place] [address]     Labs: CBC: Recent Labs  Lab 09/27/23 0238 09/28/23 0320 09/29/23 0320 09/30/23 0326 10/01/23 0522  WBC 10.8* 9.3 9.1 9.3 9.6  NEUTROABS 7.4 6.5 7.1 6.5 7.1  HGB 9.2* 9.6* 9.0* 9.3* 10.4*  HCT 28.7* 30.8* 28.4* 29.3* 33.0*  MCV 99.7 103.4* 101.1* 100.7* 100.0  PLT 310 293 285 321 344   Basic Metabolic Panel: Recent Labs  Lab 09/27/23 0238 09/28/23 0320 09/29/23 0320 09/30/23 0326 10/01/23 0522  NA 127* 128* 127* 127* 132*  K 4.4 4.1 4.0 4.1 4.2  CL 96* 99 99 98 100  CO2 21* 21* 23 22 23   GLUCOSE 97 119* 109* 110* 102*  BUN 26* 17 14 8 8   CREATININE 0.76 0.89 0.65 0.59 0.52  CALCIUM 8.5* 8.3* 8.0* 8.0* 8.5*  MG 1.8 2.2 2.0  --   --   PHOS 3.1 3.5 3.3 3.2 3.2   Liver  Function Tests: Recent Labs  Lab 09/26/23 0250 09/27/23 0238 09/28/23 0320 09/29/23 0320 09/30/23 0326 10/01/23 0522  AST 23 28 24 21   --   --   ALT 15 15 13 14   --   --    ALKPHOS 61 52 55 51  --   --   BILITOT 0.6 0.5 0.7 0.7  --   --   PROT 6.4* 6.2* 5.8* 5.5*  --   --   ALBUMIN 3.3* 3.3* 3.1* 3.0* 2.9* 3.0*   CBG: Recent Labs  Lab 09/29/23 1637 09/29/23 2008 09/30/23 0759 09/30/23 1125 09/30/23 1541  GLUCAP 108* 162* 111* 143* 106*    Discharge time spent: greater than 30 minutes.  Signed: Bertram Brocks, MD [facility] 10/01/2023

## 2023-10-01 NOTE — TOC Transition Note (Addendum)
 Transition of Care [facility]) - Discharge Note   Patient Details  Name: [name] MRN: [id number] Date of Birth: 01/24/1932  [facility]) CM/SW Contact:  Marty Sleet, LCSW Phone Number: 10/01/2023, 10:20 AM   Clinical Narrative:    Pt to [facility] to [facility]. Spoke with pt's daughter, [name] and confirmed discharge plans and referral for palliative care. CSW referred pt to [facility] for outpatient palliative care follow up. Pt will be going to room 22. RN to call report to [phone]. DC packet with signed DNR and scripts placed at RN station.   PTAR called at 10:18am for transportation.    Final next level of care: Long Term Nursing Home Barriers to Discharge: Barriers Resolved   Patient Goals and CMS Choice Patient states their goals for this hospitalization and ongoing recovery are:: To return to [facility].gov Compare Post Acute Care list provided to:: Patient Represenative (must comment) (Pt daugher) Choice offered to / list presented to : Adult Children [place] ownership interest in [facility].provided to:: Adult Children    Discharge Placement   Existing PASRR number confirmed : 10/01/23          Patient chooses bed at: [facility] Patient to be transferred to facility by: PTAR Name of family member notified: Daughter, [name] Patient and family notified of of transfer: 10/01/23  Discharge Plan and Services Additional resources added to the After Visit Summary for   In-house Referral: Clinical Social Work Discharge Planning Services: CM Consult Post Acute Care Choice: Nursing Home          DME Arranged: N/A DME Agency: NA       HH Arranged: NA HH Agency: NA        Social Drivers of Health (SDOH) Interventions SDOH Screenings   Food Insecurity: No Food Insecurity (09/25/2023)  Housing: Low Risk  (09/25/2023)  Transportation Needs: No Transportation Needs (09/25/2023)  Utilities: Not  At Risk (09/25/2023)  Depression (PHQ2-9): Medium Risk (04/21/2019)  Financial Resource Strain: Low Risk  (12/08/2017)  Physical Activity: Inactive (12/12/2018)  Social Connections: Moderately Integrated (09/25/2023)  Stress: No Stress Concern Present (12/08/2017)  Tobacco Use: Low Risk  (09/28/2023)     Readmission Risk Interventions    10/01/2023   10:18 AM 09/28/2023   11:46 AM 01/12/2023    3:12 PM  Readmission Risk Prevention Plan  Post Dischage Appt   Not Complete  Medication Screening   Complete  Transportation Screening Complete Complete Complete  PCP or Specialist Appt within 3-5 Days Complete Complete   HRI or Home Care Consult Complete Complete   Social Work Consult for Recovery Care Planning/Counseling Complete Complete   Palliative Care Screening Not Applicable Not Applicable   Medication Review Oceanographer) Complete Complete

## 2023-10-01 NOTE — NC FL2 (Signed)
 [place]  MEDICAID FL2 LEVEL OF CARE FORM     IDENTIFICATION  Patient Name: [name] [name] Birthdate: 1931-09-06 Sex: female Admission Date (Current Location): 09/25/2023  [facility] and [place] Number:  Producer, television/film/video and Address:  [facility],  [address] [place], [place] [address]      Provider Number: [id number]  Attending Physician Name and Address:  Loma Rising, MD  Relative Name and Phone Number:  [name] (Daughter)  [phone]    Current Level of Care: Hospital Recommended Level of Care: Nursing Facility Prior Approval Number:    Date Approved/Denied:   PASRR Number: [id number]  Discharge Plan: SNF    Current Diagnoses: Patient Active Problem List   Diagnosis Date Noted   Palliative care encounter 09/30/2023   Goals of care, counseling/discussion 09/30/2023   Counseling and coordination of care 09/30/2023   Need for emotional support 09/30/2023   Pneumonia of right lower lobe due to infectious organism 09/30/2023   Hemoptysis 09/27/2023   COPD exacerbation (HCC) 09/27/2023   Acute respiratory failure with hypoxia (HCC) 09/25/2023   COPD with acute exacerbation (HCC) 09/25/2023   Pressure injury of skin 09/25/2023   Essential hypertension 09/25/2023   Hypocalcemia 09/25/2023   CAP (community acquired pneumonia) 09/25/2023   Sepsis (HCC) 09/25/2023   Generalized weakness 07/26/2023   Protein-calorie malnutrition, severe 01/14/2023   Rhabdomyolysis 01/12/2023   Brain contusion (HCC) 01/12/2023   Fall at home, initial encounter 01/12/2023   Transaminitis 01/12/2023   UTI (urinary tract infection) 01/12/2023   Lymphedema of both lower extremities 12/23/2021   Hypertension associated with diabetes (HCC) 02/20/2019   Hyperlipidemia associated with type 2 diabetes mellitus (HCC) 02/20/2019   Depression, recurrent (HCC) 02/20/2019   Type 2 diabetes mellitus without complication, without long-term current use of insulin  (HCC) 09/26/2015    COPD (chronic obstructive pulmonary disease) (HCC) 05/29/2015   Dermatographia 05/16/2015   Rectal bleeding 03/07/2014   History of total left hip arthroplasty 01/25/2014   Edema 01/02/2014   Hypoxia 01/02/2014   S/P total hip arthroplasty 12/27/2013   Hyperlipidemia    Anxiety    Depression with anxiety    Rhinitis    Atrophic vaginitis    Postmenopausal     Orientation RESPIRATION BLADDER Height & Weight     Self, Place  Normal Incontinent Weight: 143 lb 15.4 oz (65.3 kg) Height:  5' 2 (157.5 cm)  BEHAVIORAL SYMPTOMS/MOOD NEUROLOGICAL BOWEL NUTRITION STATUS      Incontinent Diet (See DC Summary)  AMBULATORY STATUS COMMUNICATION OF NEEDS Skin   Extensive Assist Verbally PU Stage and Appropriate Care (Pressure Injury Heel Left Stage 1) PU Stage 1 Dressing: Daily                     Personal Care Assistance Level of Assistance  Bathing, Feeding, Dressing Bathing Assistance: Maximum assistance Feeding assistance: Limited assistance Dressing Assistance: Maximum assistance     Functional Limitations Info  Sight, Hearing, Speech Sight Info: Impaired Hearing Info: Impaired Speech Info: Adequate    SPECIAL CARE FACTORS FREQUENCY                       Contractures Contractures Info: Not present    Additional Factors Info  Code Status, Allergies, Psychotropic Code Status Info: DNR Allergies Info: Lisinopril , Penicillins, Betadine (Povidone Iodine), Hydrogen Peroxide, Sertraline Hcl, Sulfa Antibiotics Psychotropic Info: See MAR         Current Medications (10/01/2023):  This is the current  hospital active medication list Current Facility-Administered Medications  Medication Dose Route Frequency Provider Last Rate Last Admin   acetaminophen  (TYLENOL ) tablet 650 mg  650 mg Oral Q6H PRN Danice Dural, MD   650 mg at 10/01/23 8295   Or   acetaminophen  (TYLENOL ) suppository 650 mg  650 mg Rectal Q6H PRN Danice Dural, MD       ALPRAZolam  (XANAX )  tablet 0.25 mg  0.25 mg Oral BID PRN Danice Dural, MD   0.25 mg at 10/01/23 6213   arformoterol (BROVANA) nebulizer solution 15 mcg  15 mcg Nebulization Q12H Payne, John D, PA-C   15 mcg at 10/01/23 0865   atenolol  (TENORMIN ) tablet 25 mg  25 mg Oral BID Danice Dural, MD   25 mg at 10/01/23 7846   benzonatate  (TESSALON ) capsule 100 mg  100 mg Oral TID PRN Chavez, Abigail, NP   100 mg at 09/29/23 2118   bisacodyl  (DULCOLAX) suppository 10 mg  10 mg Rectal Daily PRN Sheikh, Omair Latif, DO       budesonide  (PULMICORT ) nebulizer solution 0.5 mg  0.5 mg Nebulization BID Payne, John D, PA-C   0.5 mg at 10/01/23 0819   calcium carbonate (OS-CAL - dosed in mg of elemental calcium) tablet 1,250 mg  500 mg of elemental calcium Oral Q breakfast Danice Dural, MD   1,250 mg at 10/01/23 0913   Chlorhexidine  Gluconate Cloth 2 % PADS 6 each  6 each Topical Daily Danice Dural, MD   6 each at 10/01/23 0913   cholecalciferol (VITAMIN D3) 25 MCG (1000 UNIT) tablet 2,000 Units  2,000 Units Oral Daily Danice Dural, MD   2,000 Units at 10/01/23 9629   diclofenac  Sodium (VOLTAREN ) 1 % topical gel 2 g  2 g Topical QID Danice Dural, MD   2 g at 10/01/23 5284   escitalopram  (LEXAPRO ) tablet 20 mg  20 mg Oral Daily Danice Dural, MD   20 mg at 10/01/23 1324   gabapentin  (NEURONTIN ) capsule 200 mg  200 mg Oral TID Danice Dural, MD   200 mg at 10/01/23 0912   guaiFENesin (MUCINEX) 12 hr tablet 600 mg  600 mg Oral BID Olalere, Adewale A, MD   600 mg at 10/01/23 0912   hydrALAZINE (APRESOLINE) injection 10 mg  10 mg Intravenous Q4H PRN Danice Dural, MD   10 mg at 09/27/23 0743   HYDROcodone  bit-homatropine (HYCODAN) 5-1.5 MG/5ML syrup 5 mL  5 mL Oral Q6H PRN Sheikh, Omair Latif, DO   5 mL at 09/29/23 2119   insulin  aspart (novoLOG ) injection 0-15 Units  0-15 Units Subcutaneous TID WC Danice Dural, MD   2 Units at 09/30/23 1145   losartan  (COZAAR ) tablet 25 mg  25  mg Oral Daily Danice Dural, MD   25 mg at 10/01/23 0912   melatonin tablet 5 mg  5 mg Oral QHS Danice Dural, MD   5 mg at 09/30/23 2158   metroNIDAZOLE  (FLAGYL ) IVPB 500 mg  500 mg Intravenous BID Sheikh, Omair Latif, DO 100 mL/hr at 10/01/23 0915 500 mg at 10/01/23 0915   ondansetron  (ZOFRAN ) tablet 4 mg  4 mg Oral Q6H PRN Danice Dural, MD       Or   ondansetron  (ZOFRAN ) injection 4 mg  4 mg Intravenous Q6H PRN Danice Dural, MD       pantoprazole (PROTONIX) EC tablet 40 mg  40 mg Oral BID Rai, Ripudeep  K, MD   40 mg at 10/01/23 0912   polyethylene glycol (MIRALAX  / GLYCOLAX ) packet 17 g  17 g Oral BID Aura Leeds [place], DO   17 g at 09/29/23 2117   pravastatin  (PRAVACHOL ) tablet 40 mg  40 mg Oral q1800 Danice Dural, MD   40 mg at 09/30/23 1700   senna-docusate (Senokot-S) tablet 1 tablet  1 tablet Oral BID Sheikh, Omair Latif, DO   1 tablet at 10/01/23 0913   sodium chloride  (OCEAN) 0.65 % nasal spray 1 spray  1 spray Each Nare PRN Chavez, Abigail, NP       tamsulosin  (FLOMAX ) capsule 0.4 mg  0.4 mg Oral Daily Danice Dural, MD   0.4 mg at 10/01/23 1610     Discharge Medications: Please see discharge summary for a list of discharge medications.  Relevant Imaging Results:  Relevant Lab Results:   Additional Information SSN: [id number] [place], LCSW

## 2023-10-04 DIAGNOSIS — G629 Polyneuropathy, unspecified: Secondary | ICD-10-CM | POA: Diagnosis not present

## 2023-10-04 DIAGNOSIS — J449 Chronic obstructive pulmonary disease, unspecified: Secondary | ICD-10-CM | POA: Diagnosis not present

## 2023-10-04 DIAGNOSIS — I10 Essential (primary) hypertension: Secondary | ICD-10-CM | POA: Diagnosis not present

## 2023-10-04 DIAGNOSIS — J189 Pneumonia, unspecified organism: Secondary | ICD-10-CM | POA: Diagnosis not present

## 2023-10-05 DIAGNOSIS — E871 Hypo-osmolality and hyponatremia: Secondary | ICD-10-CM | POA: Diagnosis not present

## 2023-10-05 DIAGNOSIS — F32A Depression, unspecified: Secondary | ICD-10-CM | POA: Diagnosis not present

## 2023-10-05 DIAGNOSIS — I10 Essential (primary) hypertension: Secondary | ICD-10-CM | POA: Diagnosis not present

## 2023-10-05 DIAGNOSIS — G9341 Metabolic encephalopathy: Secondary | ICD-10-CM | POA: Diagnosis not present

## 2023-10-06 DIAGNOSIS — I10 Essential (primary) hypertension: Secondary | ICD-10-CM | POA: Diagnosis not present

## 2023-10-06 DIAGNOSIS — E119 Type 2 diabetes mellitus without complications: Secondary | ICD-10-CM | POA: Diagnosis not present

## 2023-10-07 DIAGNOSIS — F32A Depression, unspecified: Secondary | ICD-10-CM | POA: Diagnosis not present

## 2023-10-07 DIAGNOSIS — E871 Hypo-osmolality and hyponatremia: Secondary | ICD-10-CM | POA: Diagnosis not present

## 2023-10-07 DIAGNOSIS — N390 Urinary tract infection, site not specified: Secondary | ICD-10-CM | POA: Diagnosis not present

## 2023-10-07 DIAGNOSIS — J690 Pneumonitis due to inhalation of food and vomit: Secondary | ICD-10-CM | POA: Diagnosis not present

## 2023-10-07 DIAGNOSIS — I10 Essential (primary) hypertension: Secondary | ICD-10-CM | POA: Diagnosis not present

## 2023-10-11 DIAGNOSIS — R10819 Abdominal tenderness, unspecified site: Secondary | ICD-10-CM | POA: Diagnosis not present

## 2023-10-11 DIAGNOSIS — R0602 Shortness of breath: Secondary | ICD-10-CM | POA: Diagnosis not present

## 2023-10-11 DIAGNOSIS — E222 Syndrome of inappropriate secretion of antidiuretic hormone: Secondary | ICD-10-CM | POA: Diagnosis not present

## 2023-10-11 DIAGNOSIS — J449 Chronic obstructive pulmonary disease, unspecified: Secondary | ICD-10-CM | POA: Diagnosis not present

## 2023-10-11 DIAGNOSIS — J9600 Acute respiratory failure, unspecified whether with hypoxia or hypercapnia: Secondary | ICD-10-CM | POA: Diagnosis not present

## 2023-10-11 DIAGNOSIS — F32A Depression, unspecified: Secondary | ICD-10-CM | POA: Diagnosis not present

## 2023-10-11 DIAGNOSIS — E871 Hypo-osmolality and hyponatremia: Secondary | ICD-10-CM | POA: Diagnosis not present

## 2023-10-12 DIAGNOSIS — R2681 Unsteadiness on feet: Secondary | ICD-10-CM | POA: Diagnosis not present

## 2023-10-12 DIAGNOSIS — M6281 Muscle weakness (generalized): Secondary | ICD-10-CM | POA: Diagnosis not present

## 2023-10-12 DIAGNOSIS — R278 Other lack of coordination: Secondary | ICD-10-CM | POA: Diagnosis not present

## 2023-10-12 DIAGNOSIS — R300 Dysuria: Secondary | ICD-10-CM | POA: Diagnosis not present

## 2023-10-12 DIAGNOSIS — R41841 Cognitive communication deficit: Secondary | ICD-10-CM | POA: Diagnosis not present

## 2023-10-12 DIAGNOSIS — J189 Pneumonia, unspecified organism: Secondary | ICD-10-CM | POA: Diagnosis not present

## 2023-10-12 DIAGNOSIS — R1312 Dysphagia, oropharyngeal phase: Secondary | ICD-10-CM | POA: Diagnosis not present

## 2023-10-12 DIAGNOSIS — J449 Chronic obstructive pulmonary disease, unspecified: Secondary | ICD-10-CM | POA: Diagnosis not present

## 2023-10-13 DIAGNOSIS — R2681 Unsteadiness on feet: Secondary | ICD-10-CM | POA: Diagnosis not present

## 2023-10-13 DIAGNOSIS — J449 Chronic obstructive pulmonary disease, unspecified: Secondary | ICD-10-CM | POA: Diagnosis not present

## 2023-10-13 DIAGNOSIS — M6281 Muscle weakness (generalized): Secondary | ICD-10-CM | POA: Diagnosis not present

## 2023-10-13 DIAGNOSIS — R41841 Cognitive communication deficit: Secondary | ICD-10-CM | POA: Diagnosis not present

## 2023-10-13 DIAGNOSIS — R1312 Dysphagia, oropharyngeal phase: Secondary | ICD-10-CM | POA: Diagnosis not present

## 2023-10-13 DIAGNOSIS — R278 Other lack of coordination: Secondary | ICD-10-CM | POA: Diagnosis not present

## 2023-10-15 DIAGNOSIS — R197 Diarrhea, unspecified: Secondary | ICD-10-CM | POA: Diagnosis not present

## 2023-10-16 DIAGNOSIS — A0472 Enterocolitis due to Clostridium difficile, not specified as recurrent: Secondary | ICD-10-CM | POA: Diagnosis not present

## 2023-10-18 DIAGNOSIS — E1159 Type 2 diabetes mellitus with other circulatory complications: Secondary | ICD-10-CM | POA: Diagnosis not present

## 2023-10-18 DIAGNOSIS — J189 Pneumonia, unspecified organism: Secondary | ICD-10-CM | POA: Diagnosis not present

## 2023-10-18 DIAGNOSIS — N390 Urinary tract infection, site not specified: Secondary | ICD-10-CM | POA: Diagnosis not present

## 2023-10-18 DIAGNOSIS — R109 Unspecified abdominal pain: Secondary | ICD-10-CM | POA: Diagnosis not present

## 2023-10-18 DIAGNOSIS — I10 Essential (primary) hypertension: Secondary | ICD-10-CM | POA: Diagnosis not present

## 2023-10-18 DIAGNOSIS — J9600 Acute respiratory failure, unspecified whether with hypoxia or hypercapnia: Secondary | ICD-10-CM | POA: Diagnosis not present

## 2023-10-19 DIAGNOSIS — I10 Essential (primary) hypertension: Secondary | ICD-10-CM | POA: Diagnosis not present

## 2023-10-20 DIAGNOSIS — I10 Essential (primary) hypertension: Secondary | ICD-10-CM | POA: Diagnosis not present

## 2023-10-20 DIAGNOSIS — N390 Urinary tract infection, site not specified: Secondary | ICD-10-CM | POA: Diagnosis not present

## 2023-10-20 DIAGNOSIS — J449 Chronic obstructive pulmonary disease, unspecified: Secondary | ICD-10-CM | POA: Diagnosis not present

## 2023-10-20 DIAGNOSIS — J189 Pneumonia, unspecified organism: Secondary | ICD-10-CM | POA: Diagnosis not present

## 2023-10-25 DIAGNOSIS — H6123 Impacted cerumen, bilateral: Secondary | ICD-10-CM | POA: Diagnosis not present

## 2023-10-25 DIAGNOSIS — J449 Chronic obstructive pulmonary disease, unspecified: Secondary | ICD-10-CM | POA: Diagnosis not present

## 2023-10-25 DIAGNOSIS — J189 Pneumonia, unspecified organism: Secondary | ICD-10-CM | POA: Diagnosis not present

## 2023-10-25 DIAGNOSIS — I10 Essential (primary) hypertension: Secondary | ICD-10-CM | POA: Diagnosis not present

## 2023-10-27 DIAGNOSIS — J449 Chronic obstructive pulmonary disease, unspecified: Secondary | ICD-10-CM | POA: Diagnosis not present

## 2023-10-27 DIAGNOSIS — E871 Hypo-osmolality and hyponatremia: Secondary | ICD-10-CM | POA: Diagnosis not present

## 2023-11-01 DIAGNOSIS — I10 Essential (primary) hypertension: Secondary | ICD-10-CM | POA: Diagnosis not present

## 2023-11-01 DIAGNOSIS — I739 Peripheral vascular disease, unspecified: Secondary | ICD-10-CM | POA: Diagnosis not present

## 2023-11-01 DIAGNOSIS — J449 Chronic obstructive pulmonary disease, unspecified: Secondary | ICD-10-CM | POA: Diagnosis not present

## 2023-11-01 DIAGNOSIS — F32A Depression, unspecified: Secondary | ICD-10-CM | POA: Diagnosis not present

## 2023-11-04 DIAGNOSIS — E871 Hypo-osmolality and hyponatremia: Secondary | ICD-10-CM | POA: Diagnosis not present

## 2023-11-04 DIAGNOSIS — J449 Chronic obstructive pulmonary disease, unspecified: Secondary | ICD-10-CM | POA: Diagnosis not present

## 2023-11-04 DIAGNOSIS — F32A Depression, unspecified: Secondary | ICD-10-CM | POA: Diagnosis not present

## 2023-11-04 DIAGNOSIS — I10 Essential (primary) hypertension: Secondary | ICD-10-CM | POA: Diagnosis not present

## 2023-11-05 DIAGNOSIS — E119 Type 2 diabetes mellitus without complications: Secondary | ICD-10-CM | POA: Diagnosis not present

## 2023-11-05 DIAGNOSIS — I10 Essential (primary) hypertension: Secondary | ICD-10-CM | POA: Diagnosis not present

## 2023-11-23 DIAGNOSIS — R21 Rash and other nonspecific skin eruption: Secondary | ICD-10-CM | POA: Diagnosis not present

## 2023-11-24 ENCOUNTER — Ambulatory Visit: Admitting: Cardiology

## 2023-12-01 DIAGNOSIS — N390 Urinary tract infection, site not specified: Secondary | ICD-10-CM | POA: Diagnosis not present

## 2023-12-21 DIAGNOSIS — R059 Cough, unspecified: Secondary | ICD-10-CM | POA: Diagnosis not present

## 2024-01-12 DEATH — deceased
